# Patient Record
Sex: Female | Born: 1935 | Race: Black or African American | Hispanic: No | Smoking: Former smoker
Health system: Southern US, Community
[De-identification: ages and names within clinical notes are randomized; demographics above are authoritative.]

## PROBLEM LIST (undated history)

## (undated) DIAGNOSIS — K3189 Other diseases of stomach and duodenum: Secondary | ICD-10-CM

## (undated) DIAGNOSIS — M545 Low back pain, unspecified: Secondary | ICD-10-CM

## (undated) DIAGNOSIS — I951 Orthostatic hypotension: Secondary | ICD-10-CM

## (undated) DIAGNOSIS — R0602 Shortness of breath: Secondary | ICD-10-CM

## (undated) DIAGNOSIS — E1165 Type 2 diabetes mellitus with hyperglycemia: Secondary | ICD-10-CM

## (undated) DIAGNOSIS — I209 Angina pectoris, unspecified: Secondary | ICD-10-CM

## (undated) DIAGNOSIS — E860 Dehydration: Secondary | ICD-10-CM

## (undated) DIAGNOSIS — Z9119 Patient's noncompliance with other medical treatment and regimen: Secondary | ICD-10-CM

## (undated) DIAGNOSIS — R51 Headache: Secondary | ICD-10-CM

## (undated) DIAGNOSIS — R112 Nausea with vomiting, unspecified: Secondary | ICD-10-CM

## (undated) DIAGNOSIS — I5032 Chronic diastolic (congestive) heart failure: Secondary | ICD-10-CM

## (undated) DIAGNOSIS — G43909 Migraine, unspecified, not intractable, without status migrainosus: Secondary | ICD-10-CM

## (undated) DIAGNOSIS — Z9289 Personal history of other medical treatment: Secondary | ICD-10-CM

## (undated) DIAGNOSIS — E119 Type 2 diabetes mellitus without complications: Secondary | ICD-10-CM

## (undated) DIAGNOSIS — R296 Repeated falls: Secondary | ICD-10-CM

## (undated) DIAGNOSIS — E118 Type 2 diabetes mellitus with unspecified complications: Secondary | ICD-10-CM

## (undated) DIAGNOSIS — K5900 Constipation, unspecified: Secondary | ICD-10-CM

## (undated) DIAGNOSIS — K219 Gastro-esophageal reflux disease without esophagitis: Secondary | ICD-10-CM

## (undated) DIAGNOSIS — E7800 Pure hypercholesterolemia, unspecified: Secondary | ICD-10-CM

## (undated) DIAGNOSIS — R000 Tachycardia, unspecified: Secondary | ICD-10-CM

## (undated) DIAGNOSIS — F419 Anxiety disorder, unspecified: Secondary | ICD-10-CM

## (undated) DIAGNOSIS — D649 Anemia, unspecified: Secondary | ICD-10-CM

## (undated) DIAGNOSIS — G4733 Obstructive sleep apnea (adult) (pediatric): Secondary | ICD-10-CM

## (undated) DIAGNOSIS — R7989 Other specified abnormal findings of blood chemistry: Secondary | ICD-10-CM

## (undated) DIAGNOSIS — R55 Syncope and collapse: Secondary | ICD-10-CM

## (undated) DIAGNOSIS — R0600 Dyspnea, unspecified: Secondary | ICD-10-CM

## (undated) DIAGNOSIS — R011 Cardiac murmur, unspecified: Secondary | ICD-10-CM

## (undated) DIAGNOSIS — I10 Essential (primary) hypertension: Secondary | ICD-10-CM

## (undated) DIAGNOSIS — E878 Other disorders of electrolyte and fluid balance, not elsewhere classified: Secondary | ICD-10-CM

## (undated) DIAGNOSIS — R739 Hyperglycemia, unspecified: Secondary | ICD-10-CM

## (undated) DIAGNOSIS — IMO0002 Reserved for concepts with insufficient information to code with codable children: Secondary | ICD-10-CM

## (undated) DIAGNOSIS — T7840XA Allergy, unspecified, initial encounter: Secondary | ICD-10-CM

## (undated) DIAGNOSIS — G8929 Other chronic pain: Secondary | ICD-10-CM

## (undated) DIAGNOSIS — J986 Disorders of diaphragm: Secondary | ICD-10-CM

## (undated) DIAGNOSIS — N390 Urinary tract infection, site not specified: Secondary | ICD-10-CM

## (undated) DIAGNOSIS — N179 Acute kidney failure, unspecified: Secondary | ICD-10-CM

## (undated) DIAGNOSIS — B9620 Unspecified Escherichia coli [E. coli] as the cause of diseases classified elsewhere: Secondary | ICD-10-CM

## (undated) DIAGNOSIS — R634 Abnormal weight loss: Secondary | ICD-10-CM

## (undated) DIAGNOSIS — M1990 Unspecified osteoarthritis, unspecified site: Secondary | ICD-10-CM

## (undated) DIAGNOSIS — I499 Cardiac arrhythmia, unspecified: Secondary | ICD-10-CM

## (undated) DIAGNOSIS — I5031 Acute diastolic (congestive) heart failure: Secondary | ICD-10-CM

## (undated) DIAGNOSIS — A419 Sepsis, unspecified organism: Secondary | ICD-10-CM

## (undated) DIAGNOSIS — J42 Unspecified chronic bronchitis: Secondary | ICD-10-CM

## (undated) DIAGNOSIS — J9601 Acute respiratory failure with hypoxia: Secondary | ICD-10-CM

## (undated) DIAGNOSIS — J449 Chronic obstructive pulmonary disease, unspecified: Secondary | ICD-10-CM

## (undated) DIAGNOSIS — R079 Chest pain, unspecified: Secondary | ICD-10-CM

## (undated) HISTORY — DX: Chest pain, unspecified: R07.9

## (undated) HISTORY — DX: Other specified abnormal findings of blood chemistry: R79.89

## (undated) HISTORY — DX: Disorders of diaphragm: J98.6

## (undated) HISTORY — DX: Other disorders of electrolyte and fluid balance, not elsewhere classified: E87.8

## (undated) HISTORY — DX: Acute respiratory failure with hypoxia: J96.01

## (undated) HISTORY — DX: Reserved for concepts with insufficient information to code with codable children: IMO0002

## (undated) HISTORY — DX: Dehydration: E86.0

## (undated) HISTORY — DX: Type 2 diabetes mellitus without complications: E11.9

## (undated) HISTORY — PX: TONSILLECTOMY: SUR1361

## (undated) HISTORY — DX: Acute diastolic (congestive) heart failure: I50.31

## (undated) HISTORY — DX: Patient's noncompliance with other medical treatment and regimen: Z91.19

## (undated) HISTORY — DX: Chronic obstructive pulmonary disease, unspecified: J44.9

## (undated) HISTORY — DX: Sepsis, unspecified organism: A41.9

## (undated) HISTORY — DX: Syncope and collapse: R55

## (undated) HISTORY — PX: MULTIPLE TOOTH EXTRACTIONS: SHX2053

## (undated) HISTORY — DX: Chronic diastolic (congestive) heart failure: I50.32

## (undated) HISTORY — DX: Type 2 diabetes mellitus with hyperglycemia: E11.65

## (undated) HISTORY — DX: Hyperglycemia, unspecified: R73.9

## (undated) HISTORY — PX: FRACTURE SURGERY: SHX138

## (undated) HISTORY — DX: Unspecified Escherichia coli (E. coli) as the cause of diseases classified elsewhere: B96.20

## (undated) HISTORY — DX: Abnormal weight loss: R63.4

## (undated) HISTORY — DX: Urinary tract infection, site not specified: N39.0

## (undated) HISTORY — DX: Acute kidney failure, unspecified: N17.9

## (undated) HISTORY — DX: Type 2 diabetes mellitus with unspecified complications: E11.8

## (undated) HISTORY — DX: Nausea with vomiting, unspecified: R11.2

## (undated) HISTORY — PX: LAPAROSCOPIC CHOLECYSTECTOMY: SUR755

## (undated) HISTORY — PX: BACK SURGERY: SHX140

## (undated) HISTORY — DX: Other diseases of stomach and duodenum: K31.89

## (undated) HISTORY — DX: Allergy, unspecified, initial encounter: T78.40XA

## (undated) HISTORY — PX: ORIF SHOULDER FRACTURE: SHX5035

## (undated) HISTORY — DX: Repeated falls: R29.6

## (undated) HISTORY — DX: Tachycardia, unspecified: R00.0

## (undated) HISTORY — DX: Orthostatic hypotension: I95.1

## (undated) HISTORY — DX: Essential (primary) hypertension: I10

## (undated) HISTORY — DX: Anxiety disorder, unspecified: F41.9

## (undated) HISTORY — DX: Constipation, unspecified: K59.00

## (undated) HISTORY — DX: Dyspnea, unspecified: R06.00

---

## 1968-07-29 HISTORY — PX: ECTOPIC PREGNANCY SURGERY: SHX613

## 1968-07-29 HISTORY — PX: APPENDECTOMY: SHX54

## 1977-07-29 HISTORY — PX: TOTAL ABDOMINAL HYSTERECTOMY: SHX209

## 1998-01-22 ENCOUNTER — Emergency Department (HOSPITAL_COMMUNITY): Admission: EM | Admit: 1998-01-22 | Discharge: 1998-01-22 | Payer: Self-pay | Admitting: Emergency Medicine

## 1998-04-06 ENCOUNTER — Emergency Department (HOSPITAL_COMMUNITY): Admission: EM | Admit: 1998-04-06 | Discharge: 1998-04-06 | Payer: Self-pay | Admitting: Emergency Medicine

## 1998-08-12 ENCOUNTER — Encounter: Payer: Self-pay | Admitting: Emergency Medicine

## 1998-08-12 ENCOUNTER — Emergency Department (HOSPITAL_COMMUNITY): Admission: EM | Admit: 1998-08-12 | Discharge: 1998-08-12 | Payer: Self-pay | Admitting: Emergency Medicine

## 1999-11-18 ENCOUNTER — Emergency Department (HOSPITAL_COMMUNITY): Admission: EM | Admit: 1999-11-18 | Discharge: 1999-11-18 | Payer: Self-pay | Admitting: Emergency Medicine

## 1999-11-18 ENCOUNTER — Encounter: Payer: Self-pay | Admitting: Emergency Medicine

## 2000-07-21 ENCOUNTER — Emergency Department (HOSPITAL_COMMUNITY): Admission: EM | Admit: 2000-07-21 | Discharge: 2000-07-21 | Payer: Self-pay | Admitting: Emergency Medicine

## 2000-07-28 ENCOUNTER — Emergency Department (HOSPITAL_COMMUNITY): Admission: EM | Admit: 2000-07-28 | Discharge: 2000-07-28 | Payer: Self-pay | Admitting: Internal Medicine

## 2001-03-27 ENCOUNTER — Emergency Department (HOSPITAL_COMMUNITY): Admission: EM | Admit: 2001-03-27 | Discharge: 2001-03-27 | Payer: Self-pay | Admitting: Emergency Medicine

## 2001-03-27 ENCOUNTER — Encounter: Payer: Self-pay | Admitting: Emergency Medicine

## 2001-03-30 ENCOUNTER — Emergency Department (HOSPITAL_COMMUNITY): Admission: EM | Admit: 2001-03-30 | Discharge: 2001-03-30 | Payer: Self-pay | Admitting: Emergency Medicine

## 2001-03-30 ENCOUNTER — Encounter: Payer: Self-pay | Admitting: *Deleted

## 2001-03-30 ENCOUNTER — Encounter: Payer: Self-pay | Admitting: Emergency Medicine

## 2001-03-31 ENCOUNTER — Encounter: Payer: Self-pay | Admitting: *Deleted

## 2001-03-31 ENCOUNTER — Ambulatory Visit (HOSPITAL_COMMUNITY): Admission: RE | Admit: 2001-03-31 | Discharge: 2001-03-31 | Payer: Self-pay | Admitting: *Deleted

## 2001-04-13 ENCOUNTER — Encounter: Payer: Self-pay | Admitting: Orthopedic Surgery

## 2001-04-13 ENCOUNTER — Encounter: Admission: RE | Admit: 2001-04-13 | Discharge: 2001-04-13 | Payer: Self-pay | Admitting: Orthopedic Surgery

## 2001-05-08 ENCOUNTER — Encounter: Payer: Self-pay | Admitting: Orthopedic Surgery

## 2001-05-08 ENCOUNTER — Encounter: Admission: RE | Admit: 2001-05-08 | Discharge: 2001-05-08 | Payer: Self-pay | Admitting: Orthopedic Surgery

## 2001-05-22 ENCOUNTER — Encounter: Admission: RE | Admit: 2001-05-22 | Discharge: 2001-05-22 | Payer: Self-pay | Admitting: Orthopedic Surgery

## 2001-05-22 ENCOUNTER — Encounter: Payer: Self-pay | Admitting: Orthopedic Surgery

## 2003-07-05 ENCOUNTER — Inpatient Hospital Stay (HOSPITAL_COMMUNITY): Admission: EM | Admit: 2003-07-05 | Discharge: 2003-07-08 | Payer: Self-pay | Admitting: Emergency Medicine

## 2003-07-06 ENCOUNTER — Encounter: Payer: Self-pay | Admitting: Cardiology

## 2003-07-11 ENCOUNTER — Ambulatory Visit (HOSPITAL_COMMUNITY): Admission: RE | Admit: 2003-07-11 | Discharge: 2003-07-11 | Payer: Self-pay | Admitting: *Deleted

## 2003-09-06 ENCOUNTER — Inpatient Hospital Stay (HOSPITAL_COMMUNITY): Admission: EM | Admit: 2003-09-06 | Discharge: 2003-09-09 | Payer: Self-pay | Admitting: Emergency Medicine

## 2003-09-07 ENCOUNTER — Encounter (INDEPENDENT_AMBULATORY_CARE_PROVIDER_SITE_OTHER): Payer: Self-pay | Admitting: Specialist

## 2003-10-23 ENCOUNTER — Emergency Department (HOSPITAL_COMMUNITY): Admission: EM | Admit: 2003-10-23 | Discharge: 2003-10-23 | Payer: Self-pay | Admitting: Emergency Medicine

## 2004-02-29 ENCOUNTER — Ambulatory Visit (HOSPITAL_COMMUNITY): Admission: RE | Admit: 2004-02-29 | Discharge: 2004-02-29 | Payer: Self-pay | Admitting: Family Medicine

## 2004-05-18 ENCOUNTER — Emergency Department (HOSPITAL_COMMUNITY): Admission: EM | Admit: 2004-05-18 | Discharge: 2004-05-18 | Payer: Self-pay | Admitting: Emergency Medicine

## 2004-06-07 ENCOUNTER — Ambulatory Visit: Payer: Self-pay | Admitting: Family Medicine

## 2004-06-14 ENCOUNTER — Ambulatory Visit (HOSPITAL_COMMUNITY): Admission: RE | Admit: 2004-06-14 | Discharge: 2004-06-14 | Payer: Self-pay | Admitting: Internal Medicine

## 2004-08-16 ENCOUNTER — Ambulatory Visit (HOSPITAL_COMMUNITY): Admission: RE | Admit: 2004-08-16 | Discharge: 2004-08-16 | Payer: Self-pay | Admitting: *Deleted

## 2004-08-16 ENCOUNTER — Encounter (INDEPENDENT_AMBULATORY_CARE_PROVIDER_SITE_OTHER): Payer: Self-pay | Admitting: *Deleted

## 2004-09-06 ENCOUNTER — Ambulatory Visit: Payer: Self-pay | Admitting: Family Medicine

## 2004-10-02 ENCOUNTER — Ambulatory Visit: Payer: Self-pay | Admitting: Family Medicine

## 2004-10-03 ENCOUNTER — Ambulatory Visit: Payer: Self-pay | Admitting: Family Medicine

## 2004-12-27 ENCOUNTER — Ambulatory Visit: Payer: Self-pay | Admitting: Family Medicine

## 2005-01-03 ENCOUNTER — Ambulatory Visit (HOSPITAL_COMMUNITY): Admission: RE | Admit: 2005-01-03 | Discharge: 2005-01-03 | Payer: Self-pay | Admitting: Family Medicine

## 2005-01-11 ENCOUNTER — Encounter: Admission: RE | Admit: 2005-01-11 | Discharge: 2005-01-11 | Payer: Self-pay | Admitting: Internal Medicine

## 2005-03-12 ENCOUNTER — Ambulatory Visit: Payer: Self-pay | Admitting: Family Medicine

## 2005-04-16 ENCOUNTER — Ambulatory Visit: Payer: Self-pay | Admitting: Family Medicine

## 2005-05-21 ENCOUNTER — Ambulatory Visit: Payer: Self-pay | Admitting: Family Medicine

## 2005-07-02 ENCOUNTER — Ambulatory Visit: Payer: Self-pay | Admitting: Family Medicine

## 2005-09-12 ENCOUNTER — Ambulatory Visit: Payer: Self-pay | Admitting: Family Medicine

## 2005-12-05 ENCOUNTER — Ambulatory Visit: Payer: Self-pay | Admitting: Family Medicine

## 2006-01-02 ENCOUNTER — Ambulatory Visit: Payer: Self-pay | Admitting: Family Medicine

## 2006-01-31 ENCOUNTER — Emergency Department (HOSPITAL_COMMUNITY): Admission: EM | Admit: 2006-01-31 | Discharge: 2006-01-31 | Payer: Self-pay | Admitting: *Deleted

## 2006-05-22 ENCOUNTER — Ambulatory Visit: Payer: Self-pay | Admitting: Family Medicine

## 2006-05-24 ENCOUNTER — Emergency Department (HOSPITAL_COMMUNITY): Admission: EM | Admit: 2006-05-24 | Discharge: 2006-05-24 | Payer: Self-pay | Admitting: Emergency Medicine

## 2006-06-10 ENCOUNTER — Ambulatory Visit: Payer: Self-pay | Admitting: Family Medicine

## 2006-06-23 ENCOUNTER — Encounter: Admission: RE | Admit: 2006-06-23 | Discharge: 2006-06-23 | Payer: Self-pay | Admitting: Orthopaedic Surgery

## 2006-06-26 ENCOUNTER — Ambulatory Visit: Payer: Self-pay | Admitting: Family Medicine

## 2006-07-09 ENCOUNTER — Encounter: Admission: RE | Admit: 2006-07-09 | Discharge: 2006-07-09 | Payer: Self-pay | Admitting: Orthopaedic Surgery

## 2006-07-25 ENCOUNTER — Encounter: Admission: RE | Admit: 2006-07-25 | Discharge: 2006-07-25 | Payer: Self-pay | Admitting: *Deleted

## 2006-07-30 ENCOUNTER — Encounter: Admission: RE | Admit: 2006-07-30 | Discharge: 2006-07-30 | Payer: Self-pay | Admitting: Orthopaedic Surgery

## 2006-08-21 ENCOUNTER — Encounter: Admission: RE | Admit: 2006-08-21 | Discharge: 2006-08-21 | Payer: Self-pay | Admitting: Orthopaedic Surgery

## 2006-09-11 ENCOUNTER — Ambulatory Visit: Payer: Self-pay | Admitting: Family Medicine

## 2006-09-23 ENCOUNTER — Emergency Department (HOSPITAL_COMMUNITY): Admission: EM | Admit: 2006-09-23 | Discharge: 2006-09-24 | Payer: Self-pay | Admitting: Emergency Medicine

## 2006-09-25 ENCOUNTER — Inpatient Hospital Stay (HOSPITAL_COMMUNITY): Admission: EM | Admit: 2006-09-25 | Discharge: 2006-09-29 | Payer: Self-pay | Admitting: Emergency Medicine

## 2006-09-27 HISTORY — PX: LUMBAR MICRODISCECTOMY: SHX99

## 2006-11-20 ENCOUNTER — Ambulatory Visit: Payer: Self-pay | Admitting: Family Medicine

## 2006-12-22 ENCOUNTER — Emergency Department (HOSPITAL_COMMUNITY): Admission: EM | Admit: 2006-12-22 | Discharge: 2006-12-22 | Payer: Self-pay | Admitting: *Deleted

## 2006-12-25 ENCOUNTER — Ambulatory Visit: Payer: Self-pay | Admitting: Family Medicine

## 2007-04-17 ENCOUNTER — Ambulatory Visit: Payer: Self-pay | Admitting: Internal Medicine

## 2007-05-03 ENCOUNTER — Emergency Department (HOSPITAL_COMMUNITY): Admission: EM | Admit: 2007-05-03 | Discharge: 2007-05-03 | Payer: Self-pay | Admitting: Emergency Medicine

## 2007-07-07 ENCOUNTER — Ambulatory Visit: Payer: Self-pay | Admitting: Internal Medicine

## 2007-07-07 ENCOUNTER — Encounter (INDEPENDENT_AMBULATORY_CARE_PROVIDER_SITE_OTHER): Payer: Self-pay | Admitting: Family Medicine

## 2007-07-07 LAB — CONVERTED CEMR LAB
Albumin: 4 g/dL (ref 3.5–5.2)
CO2: 23 meq/L (ref 19–32)
Cholesterol: 236 mg/dL — ABNORMAL HIGH (ref 0–200)
Eosinophils Relative: 0 % (ref 0–5)
Glucose, Bld: 84 mg/dL (ref 70–99)
HCT: 40.5 % (ref 36.0–46.0)
Hemoglobin: 13.4 g/dL (ref 12.0–15.0)
Lymphocytes Relative: 30 % (ref 12–46)
Lymphs Abs: 1.8 10*3/uL (ref 0.7–4.0)
Platelets: 311 10*3/uL (ref 150–400)
Sodium: 142 meq/L (ref 135–145)
Total Bilirubin: 0.3 mg/dL (ref 0.3–1.2)
Total Protein: 7.3 g/dL (ref 6.0–8.3)
Triglycerides: 209 mg/dL — ABNORMAL HIGH (ref <150)
VLDL: 42 mg/dL — ABNORMAL HIGH (ref 0–40)
WBC: 6 10*3/uL (ref 4.0–10.5)

## 2007-07-11 ENCOUNTER — Emergency Department (HOSPITAL_COMMUNITY): Admission: EM | Admit: 2007-07-11 | Discharge: 2007-07-11 | Payer: Self-pay | Admitting: *Deleted

## 2007-08-04 ENCOUNTER — Ambulatory Visit: Payer: Self-pay | Admitting: Internal Medicine

## 2007-09-09 ENCOUNTER — Ambulatory Visit: Payer: Self-pay | Admitting: Internal Medicine

## 2007-09-21 ENCOUNTER — Inpatient Hospital Stay (HOSPITAL_COMMUNITY): Admission: EM | Admit: 2007-09-21 | Discharge: 2007-09-23 | Payer: Self-pay | Admitting: Emergency Medicine

## 2007-09-21 ENCOUNTER — Ambulatory Visit: Payer: Self-pay | Admitting: Cardiology

## 2007-11-04 ENCOUNTER — Inpatient Hospital Stay (HOSPITAL_COMMUNITY): Admission: EM | Admit: 2007-11-04 | Discharge: 2007-11-15 | Payer: Self-pay | Admitting: Emergency Medicine

## 2007-11-30 ENCOUNTER — Ambulatory Visit: Payer: Self-pay | Admitting: Internal Medicine

## 2007-12-06 ENCOUNTER — Emergency Department (HOSPITAL_COMMUNITY): Admission: EM | Admit: 2007-12-06 | Discharge: 2007-12-06 | Payer: Self-pay | Admitting: Emergency Medicine

## 2007-12-08 ENCOUNTER — Ambulatory Visit: Payer: Self-pay | Admitting: Internal Medicine

## 2008-01-01 ENCOUNTER — Ambulatory Visit: Payer: Self-pay | Admitting: Internal Medicine

## 2008-01-05 ENCOUNTER — Ambulatory Visit: Payer: Self-pay | Admitting: Internal Medicine

## 2008-05-11 ENCOUNTER — Ambulatory Visit: Payer: Self-pay | Admitting: Internal Medicine

## 2008-05-11 LAB — CONVERTED CEMR LAB
BUN: 12 mg/dL (ref 6–23)
CO2: 23 meq/L (ref 19–32)
Chloride: 105 meq/L (ref 96–112)
Glucose, Bld: 86 mg/dL (ref 70–99)
Potassium: 4.2 meq/L (ref 3.5–5.3)

## 2008-05-23 ENCOUNTER — Ambulatory Visit: Payer: Self-pay | Admitting: Internal Medicine

## 2008-05-23 ENCOUNTER — Encounter (INDEPENDENT_AMBULATORY_CARE_PROVIDER_SITE_OTHER): Payer: Self-pay | Admitting: Adult Health

## 2008-05-23 LAB — CONVERTED CEMR LAB
AST: 15 units/L (ref 0–37)
Albumin: 4 g/dL (ref 3.5–5.2)
Alkaline Phosphatase: 106 units/L (ref 39–117)
BUN: 11 mg/dL (ref 6–23)
Basophils Relative: 1 % (ref 0–1)
Eosinophils Absolute: 0.1 10*3/uL (ref 0.0–0.7)
HDL: 52 mg/dL (ref >39)
LDL Cholesterol: 54 mg/dL (ref 0–99)
MCHC: 32.2 g/dL (ref 30.0–36.0)
MCV: 90.8 fL (ref 78.0–100.0)
Neutrophils Relative %: 56 % (ref 43–77)
Platelets: 299 10*3/uL (ref 150–400)
Potassium: 4.1 meq/L (ref 3.5–5.3)
Sodium: 144 meq/L (ref 135–145)
Total Bilirubin: 0.4 mg/dL (ref 0.3–1.2)
Total Protein: 7.5 g/dL (ref 6.0–8.3)
VLDL: 24 mg/dL (ref 0–40)

## 2008-09-26 ENCOUNTER — Ambulatory Visit: Payer: Self-pay | Admitting: Internal Medicine

## 2008-11-07 ENCOUNTER — Encounter: Payer: Self-pay | Admitting: Family Medicine

## 2008-11-07 ENCOUNTER — Ambulatory Visit: Payer: Self-pay | Admitting: Internal Medicine

## 2008-11-07 LAB — CONVERTED CEMR LAB
ALT: <8 units/L (ref 0–35)
AST: 19 units/L (ref 0–37)
Alkaline Phosphatase: 130 units/L — ABNORMAL HIGH (ref 39–117)
Basophils Absolute: 0 10*3/uL (ref 0.0–0.1)
Basophils Relative: 1 % (ref 0–1)
CO2: 21 meq/L (ref 19–32)
Creatinine, Ser: 0.56 mg/dL (ref 0.40–1.20)
Hemoglobin: 12.6 g/dL (ref 12.0–15.0)
LDL Cholesterol: 87 mg/dL (ref 0–99)
Lymphocytes Relative: 31 % (ref 12–46)
Microalb, Ur: 4.02 mg/dL — ABNORMAL HIGH (ref 0.00–1.89)
Monocytes Absolute: 0.3 10*3/uL (ref 0.1–1.0)
Neutro Abs: 4 10*3/uL (ref 1.7–7.7)
Neutrophils Relative %: 61 % (ref 43–77)
RDW: 14.3 % (ref 11.5–15.5)
Sed Rate: 30 mm/hr — ABNORMAL HIGH (ref 0–22)
Sodium: 140 meq/L (ref 135–145)
Total Bilirubin: 0.2 mg/dL — ABNORMAL LOW (ref 0.3–1.2)
Total CHOL/HDL Ratio: 3.3
Total Protein: 7.5 g/dL (ref 6.0–8.3)
VLDL: 25 mg/dL (ref 0–40)
Vit D, 25-Hydroxy: 24 ng/mL — ABNORMAL LOW (ref 30–89)

## 2008-11-18 ENCOUNTER — Ambulatory Visit: Payer: Self-pay | Admitting: Internal Medicine

## 2009-01-24 ENCOUNTER — Emergency Department (HOSPITAL_COMMUNITY): Admission: EM | Admit: 2009-01-24 | Discharge: 2009-01-24 | Payer: Self-pay | Admitting: Emergency Medicine

## 2009-02-02 ENCOUNTER — Emergency Department (HOSPITAL_COMMUNITY): Admission: EM | Admit: 2009-02-02 | Discharge: 2009-02-02 | Payer: Self-pay | Admitting: Emergency Medicine

## 2009-02-09 ENCOUNTER — Emergency Department (HOSPITAL_COMMUNITY): Admission: EM | Admit: 2009-02-09 | Discharge: 2009-02-09 | Payer: Self-pay | Admitting: Emergency Medicine

## 2009-02-17 ENCOUNTER — Ambulatory Visit: Payer: Self-pay | Admitting: Internal Medicine

## 2009-05-30 ENCOUNTER — Ambulatory Visit: Payer: Self-pay | Admitting: Internal Medicine

## 2009-06-12 ENCOUNTER — Ambulatory Visit: Payer: Self-pay | Admitting: Internal Medicine

## 2009-06-12 ENCOUNTER — Encounter: Payer: Self-pay | Admitting: Family Medicine

## 2009-06-12 LAB — CONVERTED CEMR LAB
ALT: 10 units/L (ref 0–35)
Alkaline Phosphatase: 89 units/L (ref 39–117)
CO2: 26 meq/L (ref 19–32)
Cholesterol: 213 mg/dL — ABNORMAL HIGH (ref 0–200)
Creatinine, Ser: 0.59 mg/dL (ref 0.40–1.20)
LDL Cholesterol: 115 mg/dL — ABNORMAL HIGH (ref 0–99)
Sodium: 139 meq/L (ref 135–145)
Total Bilirubin: 0.3 mg/dL (ref 0.3–1.2)
Total CHOL/HDL Ratio: 4.3
Total Protein: 7.2 g/dL (ref 6.0–8.3)
Triglycerides: 247 mg/dL — ABNORMAL HIGH (ref <150)
VLDL: 49 mg/dL — ABNORMAL HIGH (ref 0–40)

## 2009-06-19 ENCOUNTER — Ambulatory Visit: Payer: Self-pay | Admitting: Internal Medicine

## 2009-10-20 ENCOUNTER — Ambulatory Visit: Payer: Self-pay | Admitting: Family Medicine

## 2009-11-28 ENCOUNTER — Emergency Department (HOSPITAL_COMMUNITY): Admission: EM | Admit: 2009-11-28 | Discharge: 2009-11-28 | Payer: Self-pay | Admitting: Emergency Medicine

## 2009-12-01 ENCOUNTER — Ambulatory Visit: Payer: Self-pay | Admitting: Internal Medicine

## 2010-02-12 ENCOUNTER — Ambulatory Visit: Payer: Self-pay | Admitting: Internal Medicine

## 2010-04-10 ENCOUNTER — Ambulatory Visit: Payer: Self-pay | Admitting: Internal Medicine

## 2010-04-10 LAB — CONVERTED CEMR LAB
CO2: 24 meq/L (ref 19–32)
CRP: 0.5 mg/dL (ref <0.6)
Calcium: 9.1 mg/dL (ref 8.4–10.5)
Glucose, Bld: 163 mg/dL — ABNORMAL HIGH (ref 70–99)
Hgb A1c MFr Bld: 6.2 % — ABNORMAL HIGH (ref <5.7)
LDL Cholesterol: 108 mg/dL — ABNORMAL HIGH (ref 0–99)
Total CHOL/HDL Ratio: 3.3
VLDL: 27 mg/dL (ref 0–40)

## 2010-06-14 ENCOUNTER — Encounter (INDEPENDENT_AMBULATORY_CARE_PROVIDER_SITE_OTHER): Payer: Self-pay | Admitting: Family Medicine

## 2010-06-14 ENCOUNTER — Ambulatory Visit: Payer: Self-pay | Admitting: Vascular Surgery

## 2010-06-14 ENCOUNTER — Ambulatory Visit (HOSPITAL_COMMUNITY): Admission: RE | Admit: 2010-06-14 | Discharge: 2010-06-14 | Payer: Self-pay | Admitting: Family Medicine

## 2010-06-16 ENCOUNTER — Emergency Department (HOSPITAL_COMMUNITY): Admission: EM | Admit: 2010-06-16 | Discharge: 2010-06-16 | Payer: Self-pay | Admitting: Emergency Medicine

## 2010-07-02 ENCOUNTER — Encounter (INDEPENDENT_AMBULATORY_CARE_PROVIDER_SITE_OTHER): Payer: Self-pay | Admitting: Internal Medicine

## 2010-07-02 LAB — CONVERTED CEMR LAB
ALT: 11 units/L (ref 0–35)
AST: 20 units/L (ref 0–37)
Basophils Absolute: 0.1 10*3/uL (ref 0.0–0.1)
CO2: 28 meq/L (ref 19–32)
Calcium: 10 mg/dL (ref 8.4–10.5)
Chloride: 101 meq/L (ref 96–112)
Cholesterol: 172 mg/dL (ref 0–200)
Hemoglobin: 13.6 g/dL (ref 12.0–15.0)
Lymphocytes Relative: 42 % (ref 12–46)
Neutro Abs: 3.8 10*3/uL (ref 1.7–7.7)
Neutrophils Relative %: 50 % (ref 43–77)
Platelets: 348 10*3/uL (ref 150–400)
Potassium: 4.9 meq/L (ref 3.5–5.3)
RDW: 13.4 % (ref 11.5–15.5)
Sodium: 138 meq/L (ref 135–145)
Total Protein: 7.5 g/dL (ref 6.0–8.3)

## 2010-08-19 ENCOUNTER — Encounter: Payer: Self-pay | Admitting: Family Medicine

## 2010-10-16 LAB — URINALYSIS, ROUTINE W REFLEX MICROSCOPIC
Glucose, UA: NEGATIVE mg/dL
Hgb urine dipstick: NEGATIVE
Leukocytes, UA: NEGATIVE
Specific Gravity, Urine: 1.018 (ref 1.005–1.030)
Urobilinogen, UA: 1 mg/dL (ref 0.0–1.0)

## 2010-10-16 LAB — URINE MICROSCOPIC-ADD ON

## 2010-10-16 LAB — URINE CULTURE

## 2010-11-04 LAB — DIFFERENTIAL
Basophils Relative: 0 % (ref 0–1)
Lymphocytes Relative: 20 % (ref 12–46)
Lymphs Abs: 1.9 10*3/uL (ref 0.7–4.0)
Monocytes Absolute: 0.2 10*3/uL (ref 0.1–1.0)
Monocytes Relative: 2 % — ABNORMAL LOW (ref 3–12)
Neutro Abs: 7.1 10*3/uL (ref 1.7–7.7)
Neutrophils Relative %: 76 % (ref 43–77)

## 2010-11-04 LAB — CBC
Hemoglobin: 13.9 g/dL (ref 12.0–15.0)
RBC: 4.64 MIL/uL (ref 3.87–5.11)
WBC: 9.4 10*3/uL (ref 4.0–10.5)

## 2010-11-04 LAB — TROPONIN I: Troponin I: 0.01 ng/mL (ref 0.00–0.06)

## 2010-11-04 LAB — CK TOTAL AND CKMB (NOT AT ARMC)
CK, MB: 0.7 ng/mL (ref 0.3–4.0)
Total CK: 59 U/L (ref 7–177)

## 2010-11-04 LAB — GLUCOSE, CAPILLARY: Glucose-Capillary: 134 mg/dL — ABNORMAL HIGH (ref 70–99)

## 2010-11-04 LAB — COMPREHENSIVE METABOLIC PANEL
AST: 20 U/L (ref 0–37)
Albumin: 3.7 g/dL (ref 3.5–5.2)
Alkaline Phosphatase: 108 U/L (ref 39–117)
BUN: 12 mg/dL (ref 6–23)
GFR calc Af Amer: >60 mL/min (ref >60)
Potassium: 4 mEq/L (ref 3.5–5.1)
Total Protein: 7.6 g/dL (ref 6.0–8.3)

## 2010-11-04 LAB — POCT CARDIAC MARKERS
CKMB, poc: <1 ng/mL — ABNORMAL LOW (ref 1.0–8.0)
Troponin i, poc: <0.05 ng/mL (ref 0.00–0.09)

## 2010-12-11 NOTE — Discharge Summary (Signed)
[name], [name]              ACCOUNT NO.:  [id number]   MEDICAL RECORD NO.:  [id number]          PATIENT TYPE:  INP   LOCATION:  1435                         FACILITY:  [facility]   PHYSICIAN:  Michaelyn Barter, M.D. DATE OF BIRTH:  03-Jul-1936   DATE OF ADMISSION:  09/21/2007  DATE OF DISCHARGE:  09/23/2007                               DISCHARGE SUMMARY   PRIMARY CARE DOCTOR:  [facility].   FINAL DIAGNOSIS:  1. Atypical chest pain.  2. Cough.   PROCEDURES:  1. CT scan of the chest with angiographic contrast, completed September 21, 2007.  2. MRI of the lumbar and cervical spine, completed September 21, 2007.  3. Upper GI swallow study, completed September 22, 2007.  4. Two-view chest x-ray, completed September 21, 2007.  5. Portable chest x-ray, completed September 21, 2007.   HISTORY OF PRESENT ILLNESS:  [name] is a 75 year old female.  She  complained of centrally located chest discomfort that occurred off and  on for approximately 5 days leading up to this admission.  She indicated  that her symptoms typically occurred when she was lying in bed at night  and was described as a pressure type sensation associated with  smothering sensation during which time the patient indicated that it was  difficult for her to breathe.  She indicated that she would typically  get up and ambulate for awhile and her symptoms would resolve.  In  addition, she complained of some posterior neck pain for several months,  as well as low back pain that radiated around to the bilateral lower  abdominal quadrants.   PAST MEDICAL HISTORY:  For past medical history, please see that  dictated by Dr. Isidor Holts.   HOSPITAL COURSE:  1. Atypical chest pain.  The patient was admitted to a telemetry      floor.  An EKG completed on February 23 revealed normal sinus      rhythm, some questionable findings within leads III and V1, the      acuity of which was questionable.  The patient's  troponin Is were      found to be 0.04 and 0.05.  Her CK MBs were found to be 0.5, 0.5      and 0.6.  The patient's cardiac enzymes were negative.  She had a      chest x-ray completed on February 23, which revealed questionable      airspace disease in the right medial lung base.  PA and lateral was      recommended.  The PA and lateral x-ray revealed no evidence for      right lower lobe airspace disease.  No active disease was seen at      all.  The patient had a D-dimer completed.  It was found to be      elevated at 3.58.  A CT scan of the patient's chest with angio      contrast was completed, which revealed no pulmonary embolus.  A      mass-like density in the left lateral gastrohepatic ligament was  noted.  Because of this finding, an upper GI examination was      recommended.  The patient had an upper GI series completed on      February 24.  It revealed a small sliding hiatal hernia without      reflux, stricture or esophagitis.  Several small diverticula of the      second and third portions of the duodenum.  No acute or significant      findings were seen..  By the date of discharge, the patient's chest      pain had resolved.  She had no additional complaints.  She did      complain of a cough, however.  2. Neck and back pain.  An MRI of the patient's cervical spine was      done on February 23.  It revealed multilevel advanced degenerative      changes with mild mass effect on the spinal cord at C5-6 and mild      overall spinal stenosis C6-7.  No abnormal spinal cord signal or      enhancement was seen.  Multilevel moderate and severe neural      foraminal stenosis was most  pronounced at C4-5 on the right, C5-6      on the right and C6-7 bilaterally.  MRI of the patient's lumbar      spine revealed acute or subacute T12 anterior wedge compression      fracture with mild retropulsion of disk and superior endplate      without spinal cord compression.  Chronic L4  compression fracture      was seen.  Stable severe left neural foraminal stenosis was also      noted.  Increased left lateral recess stenosis may reflect post      operative granulation tissue.  By the date of discharge, the      patient had no additional complaints.  The decision was made to      discharge the patient from the hospital.   DISCHARGE MEDICATIONS:  The patient was discharged on:  1. Mucinex 600 mg q.12 h.  2. Darvocet 1-2 tablets q.6 h p.r.n. for pain.  3. Metoprolol 50 mg p.o. b.i.d.  4. Lipitor 20 mg daily.  5. Hydroxyzine 50 mg p.o. b.i.d.  She was instructed to continue all her other medications.   [facility] was consulted during the course of this  hospitalization.  Dr. Olga Millers saw the patient on February 23.  He  indicated that the patient's chest pain was not consistent with cardiac  etiology.  He stated that her EKG showed no ST changes, although there  was question of a prior infarct inferiorly.  He also went on to state  that the patient's pain had been almost continuous for the past 3 months  and increased with palpation and certain movements.  He indicated that  he felt that this was possibly musculoskeletal related pain.  Because  the patient did have a questionable inferior myocardial infarction on  her EKG, an echo was ordered.  The final decision was made to perform  the echo as an outpatient.  Therefore, the patient was given an  appointment for March 11 to follow-up with Dr. Jens Som.      Michaelyn Barter, M.D.  Electronically Signed     OR/MEDQ  D:  10/29/2007  T:  10/29/2007  Job:  [id number]

## 2010-12-11 NOTE — Discharge Summary (Signed)
[name], [name]              ACCOUNT NO.:  [id number]   MEDICAL RECORD NO.:  [id number]          PATIENT TYPE:  INP   LOCATION:  1424                         FACILITY:  [facility]   PHYSICIAN:  Lonia Blood, M.D.DATE OF BIRTH:  Jun 05, 1936   DATE OF ADMISSION:  11/04/2007  DATE OF DISCHARGE:  11/14/2007                               DISCHARGE SUMMARY   PRIMARY CARE PHYSICIAN:  Dineen Kid. Reche Dixon, M.D., [facility].   DISCHARGE DIAGNOSES:  1. T12 compression fracture with intractable back pain.      a.     Status post kyphoplasty.      b.     Pain much improved.  2. Rheumatoid arthritis.  3. Osteoarthritis.  4. Gastroparesis secondary to narcotics - resolved.  5. Right lower lobe pneumonia - completed course of antibiotics.  6. Urinary tract infection - completed course of antibiotics.  7. Diabetes mellitus type 2.  8. Peripheral neuropathy.  9. Hypertension.   DISCHARGE MEDICATIONS:  1. Lisinopril 10 mg p.o. daily.  2. Elavil 25 mg p.o. q.h.s.  3. Methotrexate - not currently using.  4. Metoprolol 50 mg b.i.d.  5. Lyrica 75 mg p.o. daily.  6. Amaryl 4 mg p.o. b.i.d.  7. Pepcid over-the-counter p.r.n.  8. Lipitor 20 mg p.o. daily.  9. Miacalcin nasal spray 1 puff in alternating nostrils daily.  10.Calcium plus D 500 mg 3 times a day with food.  11.Xanax 0.5 mg t.i.d. p.r.n. with #40 supplied and no refills.  12.Percocet 5/325 1-2 q.4h. p.r.n. severe pain, #30 with no refills.   FOLLOWUP:  The patient is advised to follow up with her primary care  physician, Dr. Reche Dixon, at [facility] within 7-10 days.  At that time,  the patient should be monitored to assure that her pain is well-  controlled.  Routine care for diabetes and hypertension should be  carried out at that time as well.   CONSULTATIONS:  Interventional radiology.   PROCEDURE:  Kyphoplasty on November 09, 2007 at the T12 level.   HOSPITAL COURSE:  Please see previously dictated discharge summary by  Dr.  Hillery Aldo from November 08, 2007.   After the patient's kyphoplasty, her hospital course proceeded quite  nicely.  A full course of antibiotics was continued for fever which was  felt to be due to right upper lobe pneumonia.  Urinalysis was obtained  and also revealed a urinary tract infection.  With a 7-day antibiotic  course, however, the patient's fever resolved and symptoms resolved  completely.  Chest x-ray actually revealed resolution of her pulmonary  infiltrate.  There were no urinary symptoms.   Transient confusion was encountered during the hospital stay.  This was  felt to be secondary to MS Contin.  With discontinuation of the  patient's MS Contin, this delirium resolved completely.   As the patient's hospital course continued, her mobility improved  significantly.  Physical therapy and occupational therapy evaluated the  patient.  They felt that the patient was stable for discharge home with  home health physical therapy, occupational therapy, use of rolling  walker, and use of a 3-in-1.  These arrangements were made.  On November 15, 2007, vital signs were stable, and the patient was afebrile.  She  was therefore cleared for discharge home with followup with her primary  care physician.      Lonia Blood, M.D.  Electronically Signed     JTM/MEDQ  D:  11/15/2007  T:  11/15/2007  Job:  [id number]   cc:   Dineen Kid. Reche Dixon, M.D.  Fax: [phone]

## 2010-12-11 NOTE — H&P (Signed)
[name], [name] NO.:  [id number]   MEDICAL RECORD NO.:  [id number]          PATIENT TYPE:  INP   LOCATION:  0102                         FACILITY:  [facility]   PHYSICIAN:  Altha Harm, MDDATE OF BIRTH:  07-Dec-1935   DATE OF ADMISSION:  11/04/2007  DATE OF DISCHARGE:                              HISTORY & PHYSICAL   CHIEF COMPLAINT:  Generalized intractable pain, particularly back pain  and nausea and vomiting.   HISTORY OF PRESENT ILLNESS:  This is a 75 year old lady who was recently  hospitalized with severe back pain.  The patient was discharged with  Vicodin to follow up with Dr. Ophelia Charter.  The patient states that she saw  Dr. Ophelia Charter who increased her pain medication.  However, she is still  having significant back pain.  The patient states that Dr. Ophelia Charter has  made no plans for any surgical intervention.  However, the patient  states that her pain has been in the last 48 hours severe, 10 out of 10,  in her back, nonradiating.  However, she states that the pain has gotten  so intense that she is now hurting all over.  She also states that she  has been having nausea and vomiting since last night and she is throwing  up  yellowish-greenish contents.  The patient currently rates her pain  an 8 out of 10 despite 2 mg of Dilaudid approximately 2 hours ago.  The  patient is really unable to give a very good description of the pain.  She states she has pain in her back and she is hurting all over.  The  patient particularly identifies the right side of the chest.  I am asked  to see the patient and admit her for intractable pain.   Please note the patient has an elevated D-dimer, 2.94, which is less  than her D-dimer for the last  admission just a few weeks ago.  However,  in light of the tachycardia, the patient has in the complaints of right-  sided chest pain, this elevated D-dimer cannot be normal.   PAST MEDICAL HISTORY:  1. Diabetes type 2.  2.  Osteoarthritis.  3. Rheumatoid arthritis.  4. Gastroparesis.  5. Diabetic neuropathy.  6. DJD of the spine.  7. L5-S1 herniation.  8. Hypertension.  9. Total abdominal hysterectomy, bilateral salpingo-oophorectomy.   FAMILY HISTORY:  Documented in her old records.   SOCIAL HISTORY:  The patient resides by herself.  She denies any  tobacco, alcohol or drug use.  Prior to her last hospitalization she was  able to be independent in ADL level.   ALLERGIES:  She has no known drug allergies.   CURRENT MEDICATIONS:  1. Lisinopril 10 mg p.o. daily.  2. Elavil 25 mg p.o. at bedtime.  3. Methotrexate.  The patient is unable to give me the dosing.  4. Metoprolol 50 mg p.o. b.i.d.  5. Lyrica 75 mg p.o. daily.  6. Amaryl 4 mg p.o. b.i.d.  7. Vicodin p.r.n.  8. Pepcid 20 mg p.o. daily.  9. Hydroxyzine p.r.n.Marland Kitchen   PRIMARY CARE Jayan Raymundo:  [facility].   REVIEW OF SYSTEMS:  Twelve systems were reviewed and the patient has a  negative review of systems except for those noted in the HPI.   LABORATORY DATA:  Studies in the emergency room show the following:  Sodium 139, potassium 3.9, chloride 101, bicarb 28,  BUN 2, creatinine  0.43, glucose 129.  Hemogram shows a white blood cell count of 8.4,  hemoglobin of 14.9, hematocrit of 45.1, platelet count of 370,000.  Urinalysis is negative for any elements consistent with the urinary  tract infection.  However, the patient has ketones of greater than 80.  A D-dimer is elevated to 2.94.  A 12-lead EKG shows sinus tachycardia.   PHYSICAL EXAMINATION:  On the monitor the patient is in sinus  tachycardia, heart rate of 124.  GENERAL:  In general the patient appears in distress secondary to pain.  VITAL SIGNS:  Blood pressure 153/50, heart rate 129, respiratory rate  16, temperature 98.4, oxygen saturation 98% on 2 liters.  HEENT:  Normocephalic, atraumatic.  Pupils appear equally round and  reactive to light and accommodation.   Extraocular movements are intact.  Tympanic membranes translucent bilaterally with good landmarks.  Oropharynx is tacky.  No exudate, erythema or lesions are noted.  NECK:  Trachea is midline.  No masses, no thyromegaly.  No JVD, no  carotid bruit.  CHEST:  Clinically clear to auscultation.  No wheezes, crackles or  rhonchi.  CARDIOVASCULAR:  She has a normal S1 and S2.  No murmurs, rubs or  gallops.  The patient has tachycardia.  ABDOMEN:  Morbidly obese, nontender, nondistended.  No masses, no  hepatosplenomegaly noted.  The patient has normoactive bowel sounds.  MUSCULOSKELETAL:  The patient has osteoarthritic changes and rheumatoid  changes in addition to the upper extremities including both swan neck  and boutonniere deformities.  The patient has restriction of movements  in the cervical spine and lumbosacral spine secondary to pain.  NEUROLOGICAL:  She has no focal neurological deficits.  Cranial nerves  II-XII are grossly intact.  The patient is unable to participate in the  strength examination secondary to pain but she had to move both upper  extremities against gravity without any difficulty.  DTRs are depressed  in the bilateral lower extremities and 2+ in bilateral upper  extremities.  PSYCHIATRIC:  The patient is alert and oriented x3.  She had good  insight and cognition, good recent and remote recall.   ASSESSMENT/PLAN:  This is a patient who presents with:  1. Emesis.  2. Mild dehydration.  3. Sinus tachycardia.  4. Intractable pain.  The patient will be treated with IV Dilaudid for      her pain.  If the pain persists, may need to be on a PCA.  I will      also add a long acting  medication to the pain as soon as the      patient is able to tolerate p.o. intake.  I also recommended that      Dr. Ophelia Charter be consulted to see the patient in the morning.  5. Intractable nausea and vomiting.  Will check a lipase on the      patient.  In the meantime, the patient was given  bowel rest and      aggressive hydration and clear fluids introduced and diet advanced      as tolerated.  6. In terms of a elevated D-dimer, the patient has had elevated D-  dimer in the presence of a negative CT pulmonary angiogram      approximately 3 weeks ago.  However, given the patient's      tachycardia, which could conceivably be secondary to the pain that      she is having.  However, the tachycardia combined with the right-      sided chest pain and the elevated D-dimer would dictate that the      patient be ruled out for pulmonary embolus. The patient will be      given GI and DVT prophylaxis and she will be resumed on her usual      medications.  The patient does have an elevated blood pressure and      she has been taking 10 mg of lisinopril at home and will increase      that to 20 mg of lisinopril.      Altha Harm, MD  Electronically Signed     MAM/MEDQ  D:  11/04/2007  T:  11/05/2007  Job:  [id number]

## 2010-12-11 NOTE — H&P (Signed)
NAMENICKOLA, LENIG              ACCOUNT NO.:  [id number]   MEDICAL RECORD NO.:  [id number]          PATIENT TYPE:  INP   LOCATION:  1435                         [facility]ACILITY:  [facility]   PHYSICIAN:  Isidor Holts, M.D.  DATE OF BIRTH:  [birth year]   DATE OF ADMISSION:  09/21/2007  DATE OF DISCHARGE:                              HISTORY & PHYSICAL   PRIMARY MEDICAL DOCTOR:  Dr. Fannie Knee Drinkard, [facility].   CHIEF COMPLAINT:  Recurrent retrosternal discomfort for approximately 1  week, also low back pain and neck pain for months.   HISTORY OF PRESENT ILLNESS:  This is a 75 year old female.  History is  supplied by her, and she is a very good historian.  According to her,  she has had central chest discomfort on and off since September 16, 2007.  Usually, this occurs when she is lying in bed at night and is described  as pressure, associated with a smothering sensation I can't breathe.  The patient usually gets up and walks around for awhile, and symptoms  resolve.  She has also had pain at the back of her neck for several  months and is experiencing low back pain, radiating around to her  bilateral lower abdominal quadrants.  She describes this as similar to  her radiculopathic pain prior to her diskectomy in March 2008. She  states that these symptoms had resolved following surgery, but are now  back and worsened by ambulation.   PAST MEDICAL HISTORY:  1. Type 2 diabetes mellitus.  2. Osteoarthritis.  3. Rheumatoid arthritis.  4. Gastroparesis.  5. Diabetic neuropathy.  6. History of L5-S1 herniated nucleus pulposus state/radiculopathy      status post microdiskectomy September 27, 2006 by Dr. Ophelia Charter, orthopedic      surgeon.  7. Status post appendectomy 1970.  8. Status post total abdominal hysterectomy and bilateral salpingo-      oophorectomy in 1979.  9. Hypertension.   MEDICATIONS:  1. Amaryl 4 mg p.o. b.i.d.  2. Lisinopril 10 mg p.o. daily.  3. Celebrex 200 mg p.o. daily for 5  days from September 09, 2007 to      September 14, 2007, prescribed by her primary MD because of      worsening arthritic pains.   ALLERGIES:  NO KNOWN DRUG ALLERGIES.   REVIEW OF SYSTEMS:  As per HPI and chief complaint, otherwise negative.  The patient denies shortness of breath.  Denies abdominal pain, vomiting  or diarrhea.  Denies fever or chills.   SOCIAL HISTORY:  The patient resides alone, although she has a close  friend who lives nearby.  She is usually able to do all her ADLs, her  own cooking and shopping, until about 2 days ago.  The patient is quite  independent, ambulates without a cane.  She is a nonsmoker, nondrinker.  She has one son and one daughter.   FAMILY HISTORY:  The patient's mother died at age 92.  Father died at  age 66 years,  status post MI.  She has a sister who is an alcoholic and  a brother who has end-stage liver  disease.  Family history is otherwise  noncontributory.   PHYSICAL EXAMINATION:  VITALS:  Temperature 98.7, pulse 100 per minute,  regular, respiratory rate 20, BP 165/55 mmHg, pulse oximeter 55% on room  air.  The patient does not appear to be in obvious acute distress at  time of this evaluation, alert, communicative, not short of breath at  rest.  HEENT:  No clinical pallor.  No jaundice or conjunctival injection.  Throat is clear.  NECK:  Supple.  JVP not seen.  No palpable lymphadenopathy.  No palpable  goiter.  CHEST:  Clinically clear to auscultation.  No wheezes, no crackles.  HEART:  Sounds 1 and 2 are heard, normal, regular, no murmurs,  tachycardiac.  ABDOMEN:  Morbidly obese, old surgical scars are noted. Soft, nontender.  No palpable organomegaly, no palpable masses.  Normal bowel sounds.  LOWER EXTREMITY EXAMINATION:  No pitting edema.  Palpable peripheral  pulses.  MUSCULOSKELETAL:  The patient has generalized osteoarthritic changes, as  well as rheumatoid changes in the digits of the upper extremities  including Swan-neck  and boutonniere deformities.  She has restriction of  neck movements in all directions, secondary to pain, experiences pain to  lateral rotation of torso, as well as lumbosacral tenderness to  palpation.  Straight leg raising test appears mildly positive on the  right.  CENTRAL NERVOUS SYSTEM:  No focal neurologic deficit on gross  examination.   INVESTIGATIONS:  CBC:  WBC 5.8, hemoglobin 13.3, hematocrit 38.9,  platelets 207, troponin-I point of care less than 0.05.  Electrolytes:  Sodium 142, potassium 3.4, chloride 180, CO2 27, BUN 5, creatinine 0.56,  glucose 88.  D-dimer is elevated at 3.58.  BNP is less than a 30.  Urinalysis is negative.  Chest x-ray dated September 21, 2007 showed  chronically elevated right hemidiaphragm.  Initial films showed possible  subtle right lower lobe pneumonia.  However, repeat two-view chest x-ray  showed no acute abnormalities.  Chest CT angiogram dated September 21, 2007, showed no evidence of pulmonary embolism.  There was right lower  lobe scarring and accentuated dependent interstitial opacity, right  lower lobe, likely atelectasis.  There was also a mass versus gastric  diverticula, left lateral gastrohepatic ligament.  A 12-lead EKG dated  September 21, 2007, showed sinus rhythm regular, 117 per minute, left  axis deviation, no acute ischemic changes.   ASSESSMENT AND PLAN:  1. Atypical chest pain.  We shall admit the patient, place on      telemetric monitoring, commence proton pump inhibitor treatment.      The patient does have risk factors including age, morbid obesity,      and type 2 diabetes mellitus, as well as hypertension.  We shall      consult cardiology for possible risk stratification.   1. Rheumatoid arthritis.  The patient appears to have significant neck      and low back pain, likely secondary to a combination of DJD and      rheumatoid arthritis changes.  We shall do neck and lumbosacral MRI      to elucidate anatomy.   Meanwhile commence analgesics, and involve      PT/OT.   1. Gastric diverticulum versus abdominal mass on CT angiogram. Likely,      we shall evaluate this initially with upper GI series although GI      workup may be considered. We shall discuss this with radiologists.   1. Type 2 diabetes mellitus.  This appears  controlled.  We shall check      HbA1c for completeness, continue Amaryl in pre-admission dosage,      carbohydrate-modified diet, and manage with sliding scale insulin      coverage.   1. Hypertension.  This is uncontrolled.  We shall increase Lisinopril      to 20 mg p.o. daily and also add low-dose beta blocker.   Further management will depend on clinical course.      Isidor Holts, M.D.  Electronically Signed     CO/MEDQ  D:  09/21/2007  T:  09/21/2007  Job:  [id number]

## 2010-12-11 NOTE — Discharge Summary (Signed)
NAMECAMIL, [name]              ACCOUNT NO.:  [id number]   MEDICAL RECORD NO.:  [id number]          PATIENT TYPE:  INP   LOCATION:  1441                         FACILITY:  [facility]   PHYSICIAN:  Hillery Aldo, M.D.   DATE OF BIRTH:  07/12/1936   DATE OF ADMISSION:  11/04/2007  DATE OF DISCHARGE:                               DISCHARGE SUMMARY   INTERIM DISCHARGE SUMMARY:   DATE OF DISCHARGE:  Pending.   PRIMARY CARE PHYSICIAN:  Dineen Kid. Reche Dixon, M.D., [facility].   DISCHARGE DIAGNOSES:  1. Intractable back pain secondary to degenerative disk disease, T12      compression fracture, rheumatoid and osteoarthritis.  2  Nausea and vomiting secondary to gastroparesis and possible narcotic  side effect.  1. Upper respiratory infection with possible right lower lobe      pneumonia.  2. Type 2 diabetes.  3. Hypoglycemia.  4. Peripheral neuropathy.  5. Hypertension   DISCHARGE MEDICATIONS:  To be dictated time of actual discharge.   CONSULTATIONS:  Sharen Counter, interventional radiology.   BRIEF ADMISSION HISTORY OF PRESENT ILLNESS:  Patient is a 75 year old  female who has a past medical history of degenerative disk disease,  degenerative joint disease, osteoarthritis, and rheumatoid arthritis who  had recently been hospitalized for intractable back pain.  She was  subsequently discharged after an approximate week and one-half stay for  pain control on October 29, 2007.  She subsequently represents to the  emergency department with ongoing complaints of pain on November 04, 2007.  She was admitted for further evaluation and pain control.  For full  details, please see the dictated report done by Dr. Ashley Royalty.   PROCEDURES AND DIAGNOSTIC STUDIES:  1. CT angiogram of the chest on November 05, 2007, showed no evidence of      pulmonary embolism.  There was right lower lobe atelectasis and/or      pneumonia.  Mild left lower lobe atelectasis.  2. MRI of the thoracic spine on November 06, 2007,  showed a stable      appearance of the superior endplate compression fracture of T12.      There was slight progression of retropulsion of bone from the      superior endplate of T12, facing the ventral cerebrospinal fluid      without significant stenosis.  There was minimal inferior endplate      marrow changes at T11 which may be reactive.  Compression fracture      could not be excluded at this level.  There was multiple      hemangiomata with no additional fractures seen.  3. MRI of the lumbar spine and November 07, 2007, showed incomplete      visualization of the patient's T12 fracture.  No significant change      and multilevel spondylosis with predominately left-sided foraminal      lateral recess narrowing. Left-sided facet hypertrophy and disk      bulging are the predominant findings.   DISCHARGE LABORATORY VALUES:  To be dictated at time of actual  discharge.   HOSPITAL COURSE:  '#1.  INTRACTABLE  BACK PAIN:  Due to the patient's in tractable back  pain and known T12 compression fracture, interventional radiology was  consulted for consideration of kyphoplasty.  The patient was empirically  started on calcitonin and calcium therapy.  Dr. Miles Costain did see the  patient in consultation and plans to do possible kyphoplasty on Monday.  Pain control was achieved with p.r.n. narcotics and Toradol.  She was  also put on long-acting MS Contin.   #2.  NAUSEA, VOMITING, GASTROPARESIS:  The patient has a known history  of gastroparesis and did have significant nausea and vomiting prior to  admission and intermittently during her hospital stay.  Her Reglan dose  was increased due to her underlying gastroparesis.  It was also felt  that some of her nausea and vomiting due to side effects of narcotic  pain medications.   #3.  FEVER:  The patient's fever was felt to be due to underlying upper  respiratory infection and possible pneumonia.  There was no evidence of  diskitis on MRI scanning.   She was empirically started on Zosyn when she  developed fever.  Cultures were obtained of blood and urine which were  negative.  Given her chest x-ray findings, her antibiotics were  continued and switched over to p.o. Avelox.  She should complete 7 days  of therapy.   #4.  DIABETES WITH HYPOGLYCEMIC EPISODE:  The patient was initially  maintained on her usual dose of Amaryl.  Her Amaryl was decreased  secondary to hypoglycemia.   #5.  PERIPHERAL NEUROPATHY:  The patient was maintained on the usual  dose of Lyrica.   #6.  HYPERTENSION:  The patient's blood pressure has been reasonably  well controlled.  She has had some episodes of elevated blood pressure  that were thought to be secondary to pain.  Once her pain was adequately  controlled, her blood pressure normalized.   DISPOSITION:  The patient will be stable for discharge home once she  recuperates from her kyphoplasty.  Discharge Summary addendum will be  dictated at that time.      Hillery Aldo, M.D.  Electronically Signed     CR/MEDQ  D:  11/08/2007  T:  11/08/2007  Job:  [id number]   cc:   Dineen Kid. Reche Dixon, M.D.  Fax: [phone]

## 2010-12-11 NOTE — Consult Note (Signed)
[name], [name]              ACCOUNT NO.:  [id number]   MEDICAL RECORD NO.:  [id number]          PATIENT TYPE:  INP   LOCATION:  1435                         [facility]ACILITY:  [facility]   PHYSICIAN:  Madolyn Frieze. Jens Som, MD, FACCDATE OF BIRTH:  10/21/1935   DATE OF CONSULTATION:  09/21/2007  DATE OF DISCHARGE:                                 CONSULTATION   The patient is very pleasant 75 year old female with a past medical  history of diabetes, hypertension, hyperlipidemia, and rheumatoid  arthritis who we are asked to evaluate for chest pain.  The patient has  no prior cardiac history.  Over the past 3 months, she has had diffuse  body aches.  She describes these pains as a burning sensation.  It  occurs in her legs, lower abdomen, right lateral chest area, substernal  area, neck and head.  The pain improves with pain medications.  Her  chest pain is part of this.  However, it does increase with lying flat  as well as certain movements.  It has been essentially continuous for  the past 3 months although pain medicines do improve it at times.  There  is no associated nausea, vomiting, shortness of breath or diaphoresis  with the pain although she does have shortness of breath for the past 3  months as well.  The pain does not radiate.  It is not related to food.  Because the above, we are asked to further evaluate.   CURRENT MEDICATIONS:  1. Lisinopril 20 mg p.o. daily.  2. Lopressor 12.5 mg p.o. b.i.d.  3. Amaryl 4 mg p.o. b.i.d.  4. Aspirin 81 mg p.o. daily.  5. Lovenox 40 mg subcu daily.  6. Solu-Medrol.   She has no known drug allergies.   SOCIAL HISTORY:  She does not smoke nor does she consume alcohol.   FAMILY HISTORY:  Positive for coronary artery disease in her father and  her brother.   PAST MEDICAL HISTORY:  Significant for diabetes mellitus for  approximately 9 years.  She also has hypertension and hyperlipidemia.  She has a history of osteoarthritis and rheumatoid  arthritis. She has a  history of cholecystectomy, hysterectomy, appendectomy and back surgery.  She has had a history of pancreatitis as well.  I do have a Myoview from  July 08, 2003 that showed no ischemia or infarction and the ejection  fraction was 75%.   REVIEW OF SYSTEMS:  She denies any headaches, fevers, or chills.  There  is a no productive cough or hemoptysis. There is no dysphagia,  odynophagia, melena or hematochezia. There is no dysuria or hematuria.  There is no rash or seizure activity. There is no orthopnea or PND,  but  she does occasionally have pedal edema.  The remaining systems are  negative.   PHYSICAL EXAMINATION:  Blood pressure of 164/68 and pulse of 111.  She  has a temperature 99.5.  She is 93% on room air. She is well-developed  and well-nourished and appears to be mildly anxious at the time of  evaluation. She is otherwise in no acute distress.  SKIN:  Warm and dry.  BACK:  Normal other than previous surgery.  HEENT:  Normal with normal eyelids.  NECK:  Supple with normal __________ bilaterally.  No bruits noted.  There is no jugular distention and no thyromegaly noted.  CHEST:  Clear to auscultation with expansion.  CARDIOVASCULAR:  Tachycardic rate with a regular rhythm.  There are no  murmurs, rubs or gallops noted.  ABDOMEN:  Not tender or distended.  Positive bowel sounds.  No  hepatosplenomegaly, no mass appreciated. There is no abdominal bruit.  Note she is tender over the chest and states this reproduces her pain.  She is also tender over the right flank area.  She has 2+ femoral pulses  bilaterally and no bruits.  EXTREMITIES:  No edema and I could palpate no cords. She has 2+ dorsalis  pedis pulses bilaterally.  NEUROLOGIC:  Grossly intact.   LABORATORY DATA:  Her initial markers are normal.  Her BUN and  creatinine are 5 and 0.56 with potassium of 3.4.  Her BNP is less than  30.  Her D-dimer was elevated to 3.58 but a chest CT shows no  pulmonary  embolus. There is a mass versus gastric diverticulum. Her white blood  cell count is 5.8 with a hemoglobin of 13, hematocrit of 38.9.  her  platelet count is 267.  Her urinalysis is negative.  Her  electrocardiogram shows a sinus rhythm at a rate of 117. A prior  inferior infarct cannot be excluded.   DIAGNOSIS:  1. Chest pain - the patient's chest pain is not consistent with a      cardiac etiology.  Her electrocardiogram shows no ST changes,      although there is a question of a prior inferior infarct.  Initial      enzymes are negative.  Her pain has been almost continuous for the      past 3 months and it increases with palpation certain and      movements.  I think it may be musculoskeletal in etiology.  Given      that she has a question of inferior myocardial infarction on      electrocardiogram, we will plan to proceed with an echocardiogram.      If the echo and the enzymes are unremarkable then I would not      pursue further ischemia evaluation.  Again I think it may be      musculoskeletal versus rheumatologic and I will leave this to the      primary care service.  2. Diabetes mellitus - per the primary care service.  3. Hypertension - I would recommend continuing her antihypertensives      and adjusting as an as needed.  4. Hyperlipidemia - she will continue on her statin.  Note that she      has only been on this for 3 weeks and her symptoms have been longer      than that.  I therefore do not think this is related to myalgias      related to statin use.  5. Rheumatoid arthritis - per the primary care service.      Madolyn Frieze Jens Som, MD, [facility]  Electronically Signed     BSC/MEDQ  D:  09/21/2007  T:  09/22/2007  Job:  [id number]

## 2010-12-14 NOTE — Consult Note (Signed)
[name], [name]                         ACCOUNT NO.:  [id number]   MEDICAL RECORD NO.:  [id number]                   PATIENT TYPE:  INP   LOCATION:  5735                                 FACILITY:  [facility]   PHYSICIAN:  Vikki Ports, M.D.         DATE OF BIRTH:  [birth year]   DATE OF CONSULTATION:  09/06/2003  DATE OF DISCHARGE:                                   CONSULTATION   HISTORY OF PRESENT ILLNESS:  The patient is a 75 year old black female  diabetic with known gallstones who presented now with a new history of  nausea, vomiting, and epigastric discomfort.  Workup revealed elevated  amylase and lipase. Ultrasound does not show dilatation of the common bile  duct, but does show gallstones.   PAST MEDICAL HISTORY:  Significant for diabetes mellitus, hypertension,  history of appendectomy and TAH/BSO. Medications include Amaryl, Glucophage,  Neurontin, Lopressor, and Celebrex.   FAMILY HISTORY:  Noncontributory.   REVIEW OF SYSTEMS:  Significant for gastroparesis and occasional reflux.   PHYSICAL EXAMINATION:  GENERAL: She is an age appropriate white female in no  distress.  VITAL SIGNS: Blood pressure 137/85, heart rate 60.  HEENT: Benign. Normocephalic and atraumatic. Pupils equal, round, and  reactive to light. Conjunctiva are without injection.  NECK: Supple and soft without thyromegaly.  LUNGS: Clear to auscultation and percussion times two.  HEART: Regular rate and rhythm without murmurs, rubs, or gallops.  ABDOMEN: Soft, minimally tender in the epigastrium. Well healed lower  vertical midline incision. No hernia defects are noted.  EXTREMITIES: Normal gait and station. There is no clubbing, cyanosis, or  edema.   X-RAYS:  Ultrasound shows minimal ductal dilatation, but no stone.  Amylase  and lipase are 450 and 350 respectively.   IMPRESSION:  Biliary pancreatitis.   PLAN:  Bowel rest. Recheck amylase and lipase tomorrow. Plan for  laparoscopic  cholecystectomy with intraoperative cholangiogram.                                               Vikki Ports, M.D.    KRH/MEDQ  D:  09/06/2003  T:  09/06/2003  Job:  [id number]

## 2010-12-14 NOTE — H&P (Signed)
NAMEKHALIDAH, HERBOLD                         ACCOUNT NO.:  [id number]   MEDICAL RECORD NO.:  [id number]                   PATIENT TYPE:  INP   LOCATION:  5735                                 FACILITY:  [facility]   PHYSICIAN:  Hettie Holstein, D.O.                 DATE OF BIRTH:  1936-07-28   DATE OF ADMISSION:  09/05/2003  DATE OF DISCHARGE:                                HISTORY & PHYSICAL   PATIENT'S PHYSICIAN:  Willis Modena, M.D., at [facility].   HISTORY OF PRESENT ILLNESS:  Ms. Hansell presents today with midepigastric  and midsternal pain, as well as weight loss and chest pain.  This is a  pleasant, 75 year old, African American female who presented to the [facility] Emergency Department today with complaints of midepigastric pain, as  well as nausea and vomiting for the past two to three days.  She has weight  loss of approximately 25 pounds over the past few months.  She had a  hospital course back in the first part of December at which time she  underwent imaging studies.  There was a CT performed at that time that  revealed a radiologist's impression of cholelithiasis.  Ultrasound performed  in the emergency department revealed common bile duct dilatation and  pancreatic ductal dilatation with cholelithiasis.  No masses were reported.  The gallbladder wall was thickened.  There was dilated hepatic and  pancreatic duct 10 mm and the liver, spleen, and kidneys were reported as  normal otherwise.  Ms. Kubisiak otherwise states she has had no other  associated complaints.  She denied any fever, chills, or night sweats, only  some nausea and vomiting about two to three times a day for the past several  days.  As well in the emergency department, she was noted to have elevated  lipase of 304.  No amylase was done, but she had an elevated lipase.  On the  last admission, she was set for outpatient evaluation of suspected  gastroparesis.  A gastric emptying study was  performed and revealed delayed  gastric emptying.   PAST MEDICAL HISTORY:  She has a past medical history significant for:  1. Diabetes mellitus, type 2.  2. Osteoarthritis.  3. Delayed gastric emptying recently diagnosed.  4. Cholelithiasis.  5. Diabetic nephropathy effecting the left lower extremity.  6. She had undergo an adenosine stress test the last admission which was     negative for ischemia with an ejection fraction of 75%.  She also     underwent CT scanning of her chest at that time that revealed no evidence     of pulmonary embolus.   PAST SURGICAL HISTORY:  Significant for:  1. Status post open appendectomy in 1970.  2. Status post total abdominal hysterectomy and bilateral salpingo-     oophorectomy in 1979.   CURRENT MEDICATIONS:  1. Reglan.  2. Glucophage 500 mg p.o.  b.i.d. with meals.  3. Neurontin 300 mg p.o. b.i.d.  4. Ultram q.6h. p.r.n.  5. Lopressor 25 mg p.o. b.i.d.  6. Amaryl.  7. Celebrex.   ALLERGIES:  No known drug allergies.   SOCIAL HISTORY:  Ms. Opfer denies tobacco and alcohol.  [place]  She has a sibling that lives nearby.   FAMILY HISTORY:  Her mother died at age 52.  Her father died at age 26 from  an MI.  A sister is alive at age 29 with alcoholism.  A brother is alive  with end-stage liver disease.   REVIEW OF SYSTEMS:  The patient reports continued weight loss, though some  improvement with respect to her appetite.  She states that she has not been  feeling quite well since the first of December.  Otherwise she denies any  shortness of breath, any hematochezia, melena, hematemesis, coffee ground  emesis, or swelling of her extremities.  She does report improved  symptomatology with regards to her neuropathy.   PHYSICAL EXAMINATION:  VITAL SIGNS:  Blood pressure 124/68, temperature 98.2  degrees, heart rate 84, respirations 20, O2 saturations 99% on room air.  GENERAL APPEARANCE:  This is an  awake, alert, and oriented African American  female in no acute distress.  Nontoxic appearing.  HEENT:  Normocephalic and atraumatic.  Extraocular muscles are intact.  No  conjunctival pallor.  No jaundice.  NECK:  Supple and nontender.  No palpable thyromegaly.  HEART:  S1 and S2 are regular.  LUNGS:  Clear bilaterally.  ABDOMEN:  Soft.  There was some mild right upper quadrant tenderness to deep  palpation and midepigastric tenderness to palpation.  No rebound.  No  rigidity.  Bowel sounds normoactive.  EXTREMITIES:  No edema.  Peripheral pulses were symmetrical bilaterally.   LABORATORY DATA:  The EKG revealed sinus rhythm, rate of 105, and  nonspecific T wave abnormalities.  Ultrasound of the abdomen revealed  multiple gallstones with focal wall thickening, pancreatic and hepatic ducts  dilated with a diameter of 10 mm, and normal spleen and kidneys, as well as  aorta.  Point of care within normal limits.  Lipase of 304.  WBC 7.3,  hemoglobin 14.3, hematocrit 42.8, platelet count 322.  Sodium 139, potassium  3.5, chloride 104, CO2 28, BUN 10, creatinine 0.6, glucose 104, total  bilirubin 0.4, alkaline phosphatase 93, AST 44, ALT 22, total protein 7.0,  albumin 3.3, calcium 9.0.   IMPRESSION:  This is a 75 year old female with nausea, vomiting,  midepigastric, and right upper quadrant pain with evidence of pancreatitis  and ductal dilatation suggestive of obstructive etiology, perhaps gallstone  pancreatitis.  At this time, we are keeping her NPO and awaiting Dr.  Joanette Gula input with regards to ERCP for further diagnostic evaluation.  At present, we are going to add a CA19-9 to her laboratories that have  already been drawn.   PROBLEM LIST:  1. Neurologic.  No active issues with improved diabetic nephropathy with     respect to the left lower extremity.  2. Cardiovascular.  No active issues.  The patient was discharged home on    Lopressor 25 mg b.i.d.  Will continue this.   She had a negative adenosine     stress test in December of 2004.  3. Pulmonary.  No active issues.  4. Gastrointestinal.  As noted above, ruling out gallstone     pancreatitis/cholecystitis, as well as possible pancreatic outlet  obstruction and/or mass.  5. Genitourinary.  Renal function is stable.  6. Hematology/infectious disease.  No focal signs of infection.  Currently     she is not anemic.  If she does spike a temperature or show evidence of     inflammatory response, will initiate empiric antibiotic therapy.  7. Endocrine.  The patient is diabetic.  At this time, we are going to place     her on insulin sliding scale.  We are going to hold her metformin, as     well as the Amaryl for now as she is NPO.  8. Fluids, electrolytes, and nutrition.  At present she will remain NPO.  We     are going to start her on D5 normal saline, weigh her, and continue     evaluation for the etiology her weight loss.  9. Prophylaxis.  We will initiate DVT prophylaxis while she is confined to a     bed.                                                Hettie Holstein, D.O.    ESS/MEDQ  D:  09/06/2003  T:  09/06/2003  Job:  [id number]   cc:   Willis Modena, M.D.

## 2010-12-14 NOTE — Discharge Summary (Signed)
[name], [name]             ACCOUNT NO.:  [id number]   MEDICAL RECORD NO.:  [id number]          PATIENT TYPE:  INP   LOCATION:  1429                         FACILITY:  [facility]   PHYSICIAN:  Hind I Elsaid, MD      DATE OF BIRTH:  26-Nov-1935   DATE OF ADMISSION:  09/25/2006  DATE OF DISCHARGE:  09/24/2006                               DISCHARGE SUMMARY   DISCHARGE DIAGNOSES:  1. Lower back pain and left leg pain secondary to left L-4-S1      herniated nuclear pulposus, status post microdiskectomy.  2. Type 2 diabetes mellitus.  3. Rheumatoid arthritis.  4. Diabetic neuropathy.  5. Irregular heart beat.  6. Sciatica of the left lower extremity.   CONSULTATIONS:  __________  Orthopedics was consulted for the left back  pain for possible evaluation and surgery.   PROCEDURE:  Chest x-ray.  Low lung volume without focal disease.  Stable  degenerative change in the shoulder.  MRI of the lumbar spine:  Large left foraminal and extraforaminal  protrusion at L5-S1, could bulge on the exiting left L-5 root.  Disc  protrusion on the far right at L4-5, compared with L-4 root.  Multilevel  facet arthropathy.  CT angiogram which was negative for pulmonary embolus and no acute  process.   DISCHARGE MEDICATIONS:  1. Lisinopril 10 mg p.o. daily.  2. Amitriptyline 25 mg p.o. nightly  3. Methotrexate 10 mg p.o. total once each week.  4. Amaryl 4 mg p.o. b.i.d.  5. Metoprolol 50 mg p.o. b.i.d.  6. Vicodin 1 tab p.o. q. 4 to 6 hours.  7. Lyrica 75 mg p.o. daily.  8. Prednisone tapered dose to each 10 mg p.o. daily which is the      patient's own home medication.  9. Percocet 5/325 one tab q. 4 to 6 hours p.r.n. for pain.   HOSPITAL COURSE:  A 75 year old female, multiple medical problems.  History of severe rheumatoid arthritis.  Presented with low back pain  radiating into the left lower extremity.  Has a history of chronic low  back pain and has been evaluated by Dr. Ophelia Charter,  orthopedics.  The patient  admitted for six months the pain has been getting progressively worse,  associated with weakness of the left lower extremity.  Problem 1:  Left lower back pain with left leg pain and some kind of  weakness of the left leg.  MRI was done which showed the results as  above.  Dr. Ophelia Charter was consulted for possible surgery or nerve block.  The patient was started on aggressive pain medications.  Also started on  Solu-Medrol IV for possible exacerbation of severe rheumatoid arthritis.  Dr. Ophelia Charter saw the patient and he recommended microdissection of the left  L5-S1 microdiskectomy.  The patient on March 1 underwent successful left  L5-S1 microdiskectomy with good response.  During observation after the  procedure, the patient's pain completely resolved except mild pain on  the left foot.  The patient tolerated the procedure very nicely and  without complications.  The patient will be discharged home after  physical evaluation if  the patient needs any physical therapy at home.  Discharged with pain medication.  Follow-up with Dr. Ophelia Charter as an  outpatient.  Problem 2:  The patient had sinus tachycardia during hospitalization.  Possibly due to the severity of pain at that time.  The patient was kept  on a monitor.  TSH was drawn which was completely normal.  The patient  has already been taking beta blocker.  The returned to normal sinus  rhythm after control of pain and after the procedure.  TSH during  hospitalization was 0.356 which is normal for the patient.  The patient  is now in sinus rhythm and heart rate of 77.  Problem 3:  The patient had on the day of admission a low grade fever,  T. Max of 102.  Chest x-ray, urinalysis and blood cultures were  completely negative.  The patient was kept under observation for more  than three days with no further episode of fever and no white blood  cells.  We think this could also be due to pain.  Problem 4:  Diabetic  neuropathy.  The patient to continue her own home  medication.  Problem 5:  Severe rheumatoid arthritis with high ESR.  The patient  received a dose of IV steroids.  The patient to be discharged on tapered  dose of steroids and to follow with her rheumatologist.  The patient is  already on Methotrexate.  She should follow-up with her rheumatologist,  Methotrexate level and LFT.   DISPOSITION:  The patient is stable to be discharged home with pain  medication and to resume her own home medication.      Hind Bosie Helper, MD  Electronically Signed     HIE/MEDQ  D:  09/29/2006  T:  09/29/2006  Job:  [id number]

## 2010-12-14 NOTE — Consult Note (Signed)
[name], [name]                         ACCOUNT NO.:  [id number]   MEDICAL RECORD NO.:  [id number]                   PATIENT TYPE:  INP   LOCATION:  5735                                 FACILITY:  [facility]   PHYSICIAN:  Althea Grimmer. Luther Parody, M.D.            DATE OF BIRTH:  [birth year]   DATE OF CONSULTATION:  DATE OF DISCHARGE:                                   CONSULTATION   REFERRING PHYSICIAN:  Dr. Hettie Holstein.   REASON FOR CONSULTATION:  [name] is a 75 year old female whom I am  asked to see for abdominal pain presumably from pancreatitis.  She was  admitted to the hospital with nausea, vomiting and chest discomfort on  July 05, 2003.  She was discovered to have gastroparesis with 55%  retention at 120 minutes of scanning.  It was felt that her discomfort was  likely related to the gastroparesis.  A CT scan at that time did show  calcified gallstones but there was no evidence of common bile duct stones.  She reports to me that she has had abdominal pain and nausea and vomiting  since Saturday, however, she has not vomited since coming to the emergency  room.  In the ER, she had an ultrasonogram that demonstrated an increased  common bile duct of 13 mm with a prominent pancreatic duct.  She has  gallbladder wall thickening and multiple gallstones.  There is no clear  stone seen within the CBD, which is followed well into the pancreatic head.  There is a prominent pancreatic duct as well.  Her transaminases are near  normal with an SGOT of 56 but a normal SGPT; similarly, alkaline phosphatase  and bilirubin are normal.  Her amylase, however, is 448 and lipase 319.  White blood count 7.3.   PAST MEDICAL HISTORY:  Past medical history is pertinent for:  1. Gastroparesis.  2. Diabetes with neuropathy.  3. Reported weight loss.  4. Osteoarthritis.   PAST SURGICAL HISTORY:  1. Appendectomy.  2. TAH/BSO.   MEDICATIONS:  Medications on discharge in December included:  1. Reglan 10 mg before meals.  2. Glucophage 500 mg b.i.d.  3. Celebrex p.r.n.  4. Neurontin 300 mg b.i.d.  5. Ultram q.6 h. p.r.n.  6. Lopressor 12.5 mg b.i.d.  7. Unasyn has been ordered in hospital, along with sliding-scale insulin.   ALLERGIES:  None reported.   FAMILY HISTORY:  Noncontributory.   SOCIAL HISTORY:  Non-smoker, nondrinker, lives alone, retired.   REVIEW OF SYSTEMS:  GENERAL:  Unquantified weight loss.  ENDOCRINE:  Positive for history of diabetes, type 2.  SKIN:  No rash or pruritus.  EYES:  No icterus.  She recently has complained of diplopia.  ENT:  No  aphthous ulcers or chronic sore throat.  RESPIRATORY:  She does have some  dyspnea and orthopnea.  CARDIAC:  No chest pain, palpitations or history of  valvular heart disease.  GI:  As above.  GU:  No current dysuria or  hematuria.   PHYSICAL EXAMINATION:  VITAL SIGNS:  On physical exam, she is afebrile,  blood pressure 151/73, pulse 84 and regular.  SKIN:  Skin is normal.  HEENT:  Eyes anicteric.  Oropharynx unremarkable.  NECK:  Neck is supple without thyromegaly or adenopathy.  CHEST:  Chest sounds clear.  HEART:  Heart sounds:  Regular rate and rhythm.  ABDOMEN:  Abdomen is soft.  There are hypoactive bowel sounds.  There is  tenderness high in the epigastrium only.  RECTAL:  Exam is not performed.  EXTREMITIES:  Extremities without cyanosis, clubbing, edema or rash.   LABORATORY AND ACCESSORY CLINICAL DATA:  Electrocardiogram demonstrates  sinus tachycardia.   IMPRESSION:  [age]-year-old female with diabetes mellitus and  gastroparesis whose current symptoms seem to be due to biliary pancreatitis.  In light of her established pancreatitis, I do not believe that endoscopic  retrograde cholangiopancreatogram is currently warranted, particularly since  her liver function tests are essentially normal.  However, supportive care  is in order, along with following LFTs and pancreatic enzymes.   Surgical  consultation should be obtained and I think she should have a  cholecystectomy on this admission, since I suspect that her problems in  December were also related to biliary disease.  I have taken the liberty of  notifying the surgeon of her current problem and will discuss with him  whether or not endoscopic retrograde cholangiopancreatogram is warranted in  the next few days.                                               Althea Grimmer. Luther Parody, M.D.    PJS/MEDQ  D:  09/06/2003  T:  09/06/2003  Job:  [id number]   cc:   Hettie Holstein, D.O.  Fax: [phone]   [facility], Melony Overly.D.

## 2010-12-14 NOTE — Discharge Summary (Signed)
[name], [name]                         ACCOUNT NO.:  [id number]   MEDICAL RECORD NO.:  [id number]                   PATIENT TYPE:  INP   LOCATION:  5735                                 FACILITY:  [facility]   PHYSICIAN:  Hettie Holstein, D.O.                 DATE OF BIRTH:  30-Nov-1935   DATE OF ADMISSION:  09/05/2003  DATE OF DISCHARGE:  09/09/2003                                 DISCHARGE SUMMARY   ADMISSION DIAGNOSIS:  Pancreatitis with bile duct dilatation, suspect  gallstone pancreatitis.   DISCHARGE DIAGNOSES:  1. Biliary pancreatitis and acute cholecystitis, status post laparoscopic     cholecystectomy by Vikki Ports, M.D.  2. Elevated CA19-9 of 66.9 with recommendations by Dr. Luther Parody to have     this repeated in several weeks.  3. History of gastroparesis.  4. Diabetes with neuropathy.  5. Osteoarthritis.  6. Weight loss.  7. History of appendectomy in 1970.  8. History of abdominal hysterectomy and bilateral salpingo-oophorectomy in     1979.  9. History of adenosine stress test previous admission with an ejection     fraction of 75%.   DISCHARGE MEDICATIONS:  1. Neurontin 300 mg p.o. b.i.d.  2. Ultram 50 mg one p.o. q.6h. p.r.n.  3. Lopressor 25 mg p.o. b.i.d.  4. Glucophage 500 mg p.o. b.i.d. with meals.  5. Amaryl 2 mg p.o. daily.  6. Reglan 10 mg one p.o. q.6h. x1 week.  7. Percocet 5/325 mg one p.o. q.4-6h. p.r.n.  8. Phenergan 25 mg one p.o. q.4h. p.r.n. nausea.   The patient was instructed to stay on a more consistent carbohydrate diet  and she is instructed to contact a general surgeon for all questions  regarding postoperative problems, and she is instructed to follow up with  [facility] in a week and to call for appointment.   CONSULTATIONS THIS ADMISSION:  Vikki Ports, M.D., with [facility].   PROCEDURE PERFORMED:  Laparoscopic cholecystectomy.   HISTORY OF PRESENT ILLNESS:  [name] presented with epigastric  and  midsternal pain as well as weight loss and chest pain.  She is a 75 year old  African-American female who presented to the [facility] ER with complaints  of midepigastric pain as well as nausea and vomiting for the past two to  three days.  She has weight loss of approximately 25 pounds over the past  few months.  She had a hospital course back in the first part of December,  at which time she underwent imaging studies.  There was a CT performed at  that time that revealed radiologist's impression of cholelithiasis.  Ultrasound performed in the emergency department revealed common bile duct  dilatation and pancreatic duct dilatation with cholelithiasis.  No masses  were reported, and the gallbladder was thickened.  There was dilated hepatic  and pancreatic duct to 10 mm, and the liver, spleen, and kidneys were  reported  as normal otherwise.  [name] otherwise states she has had no  other essential complaints.  She denied any fevers, chills, nights sweats,  only some nausea and vomiting about two to three times a day for the past  several days as well as in the ED.  She was noted to have an elevated lipase  of 304.  On the last admission she was sent for outpatient evaluation of  suspected gastroparesis.  A gastric emptying study was performed and  revealed late gastric emptying.   HOSPITAL COURSE:  The patient was admitted and surgery was consulted, and  laparoscopic cholecystectomy was performed.  Gastroenterology was consulted  to see her as she had an elevated CA19-9 and subsequently her course was  without event.  Dr. Luther Parody recommended repeating this study in several  weeks.   LABORATORY DATA AT THE TIME OF DISCHARGE:  CA19-9 is 86.9.  Sodium 142,  potassium 3.6, BUN 2, creatinine 0.5, glucose 136.  Hemoglobin 12.6, WBC  5.7, platelet count of 275.                                                Hettie Holstein, D.O.    ESS/MEDQ  D:  10/11/2003  T:  10/13/2003  Job:   [id number]   cc:   Jola Schmidt. Luther Parody, M.D.  [address]  [place]  [place] [address]  Fax: [phone]

## 2010-12-14 NOTE — H&P (Signed)
NAMEAMENA, DOCKHAM                         ACCOUNT NO.:  [id number]   MEDICAL RECORD NO.:  [id number]                   PATIENT TYPE:  INP   LOCATION:  4733                                 FACILITY:  [facility]   PHYSICIAN:  Ara D. Tammi Klippel, M.D.                DATE OF BIRTH:  August 31, 1935   DATE OF ADMISSION:  07/05/2003  DATE OF DISCHARGE:                                HISTORY & PHYSICAL   PRIMARY CARE PHYSICIAN:  Candis Musa, M.D. at [facility]   CHIEF COMPLAINT:  Vertigo, nausea, vomiting, chest pain, dysuria.   HISTORY OF PRESENT ILLNESS:  The patient is a very pleasant 75 year old  African-American female with a multitude of complaints.  The patient has had  chronic arthralgias from osteoarthritis for years but since July 04, 2003  in the morning she has had vertigo with sensation of the room spinning  around her.  This has been accompanied by nausea and vomiting to such a  degree that the patient began to have chest pain that was mid sternal, did  not radiate, was worsened with movement, but was relieved with nitroglycerin  upon arrival to the ER.  Was described as a pressure on her chest that was  associated with nausea and shortness of breath, but no diaphoresis and  lasted throughout the day on July 04, 2003 and the morning of July 05, 2003.  These host of symptoms continued for the past two days so the patient  came to the emergency room for further evaluation.   ALLERGIES:  No known drug allergies.   MEDICATIONS:  1. Gabapentin 300 mg p.o. b.i.d.  2. Metformin unknown dose.  3. __________  mg p.o. b.i.d.  4. Methocarbamol 500 mg p.o. q.6h. p.r.n. pain.  5. Tramadol two tablets p.o. q.6h. p.r.n. pain.   PAST MEDICAL HISTORY:  1. Diabetes mellitus type 2.  2. Osteoarthritis.   PAST SURGICAL HISTORY:  1. Status post open appendectomy in 1970.  2. Status post TAH/BSO in 1979.   SOCIAL HISTORY:  The patient denies tobacco, alcohol, illicit  or IV drug  use.  [place].  Her brother lives  nearby in an apartment above her.  She is a retired Occupational psychologist.  She has  no pets and she has city water.   FAMILY HISTORY:  Mother deceased age 32 who was killed.  Father deceased age  24 from his first MI.  Sister alive, age 4, with alcoholism.  Brother  alive, age 5, with end-stage liver disease secondary to alcohol.   REVIEW OF SYSTEMS:  The patient admits to diplopia, scintillations, scotoma,  nausea, vomiting, vertigo, dysuria, polyuria, and urinary urgency, PND,  orthopnea up to two times per week which has been new over the past month,  dyspnea on exertion which is new and which the patient has been unable to  climb the 10  steps to her brother's apartment without stopping to catch her  breath which is new in onset, chills and sweats, chest pain as described  above, palpitations.  The patient denies headache, alopecia, visual acuity  changes, auditory acuity changes, epistaxis, lesions or ulcers, dysphagia,  odynophagia, shortness of breath at rest, cough, wheeze, hemoptysis,  exposure to people with TB, chest pain with exertion, hematemesis, coffee  ground emesis, diarrhea, constipation, bright red blood per rectum, melena,  hematuria, myalgias, joint swelling or deformity, lower extremity edema,  fevers.   PHYSICAL EXAMINATION:  VITAL SIGNS:  Tmax 98.3, Tcurrent 98.1, pulse supine  97 with a blood pressure of 163/76, upright blood pressure 169/96 with a  pulse of 142.  The patient had vertiginous symptoms after standing for two  minutes.  Respirations 16, pulse oximetry 98% on room air.  GENERAL:  The patient did not appear to be in pain.  Well-developed, well-  nourished 75 year old African-American female who appears her stated age in  no apparent distress, speaking in full sentences.  HEENT:  Head normocephalic, atraumatic without alopecia.  Eyes:  Pupils are  equal, round, and  reactive to light.  Extraocular muscles are intact and  icteric.  No injection.  No discharge.  Normal appearing conjunctiva.  Ears:  TMs clear bilaterally.  Nose:  No dried blood in the nares.  Mouth:  Moist  mucous membranes.  Uvula midline.  Oropharynx without erythema or exudate  with horrible dentition.  NECK:  Supple without any meningeal signs.  There is no thyromegaly, bruit,  or JVD.  Trachea is midline.  LUNGS:  Clear to percussion and auscultation bilaterally without rales,  rhonchi, or wheezes.  HEART:  Regular rate and rhythm S1 and S2 without murmurs, rubs, or gallops.  ABDOMEN:  Nondistended, well healed laparotomy scars appreciated.  Bowel  sounds present, nontender.  No guarding or rebound.  No hepatosplenomegaly.  No pulsatile masses.  No abdominal bruits appreciated.  EXTREMITIES:  No clubbing, cyanosis, edema.  Both legs measure 28 cm 10 cm  below the tibial tuberosity.  RECTAL:  Normal sphincter tone.  No masses.  Heme-negative.  NEUROLOGIC:  Cranial nerves II-XII grossly intact.  There are no focal or  gross motor or sensory deficits appreciated.   LABORATORY DATA:  White blood cells 5.3, H&H 14/42.8, platelet count  334,000.  Sodium 143, potassium 4.0, chloride 105, carbon dioxide 30, BUN 5,  creatinine 0.5, glucose 100, calcium 9.5.  Total bilirubin 0.5, alkaline  phosphatase 75, AST 25, ALT 19, total protein 7.3, albumin 3.6.  CK 94, MB  1.0, troponin less than 0.01.  EKG:  Sinus rhythm at a rate of 98 with  normal PR, QRS, and QTC intervals.  Axis of -19.  There is downward ST  sloping in aVL with Q-wave inversion and also Q-wave inversion in lead V1.  There are no other ST or T-wave changes.  No other signs of ischemia.  There  is no evidence for hypertrophy based upon voltage.  There is no prior EKG  for comparison.   ASSESSMENT/PLAN:  A 75 year old African-American female with chest pain, nausea, vomiting, vertigo, and tachycardia.  1.  Neurologic/psychiatric:  There are no signs or symptoms of meningitis     encephalitis or cerebrovascular accident.  The patient appears euthymic.     Currently no active issues.  2. Pulmonary:  No active issues.  3. Cardiovascular:  The patient's history is worrisome with her symptoms of     progressive dyspnea on  exertion, orthopnea, PND.  Will cycle the     patient's enzymes, place her on telemetry, check an a.m. EKG and a     fasting lipid and will start a Statin since the patient is a diabetic.     Will consult cardiology for a stress test or catheterization in the     morning, whichever is more appropriate.  The patient has symptoms of     orthostasis, but with normal blood pressure and markedly increased heart     rate of almost 50 beats per minute which is more consistent with postural     orthostatic tachycardia syndrome.  Will fluid rehydrate and reevaluate in     the morning.  Will also check a 2-D echocardiogram to evaluate left     ventricular function given her history.  4. Renal:  Will check a urine protein, creatinine, microalbumin and start     the patient on low dose ACE inhibitor for diabetic nephropathy.  5. Gastrointestinal:  The patient has normal LFTs, so will start with low     dose Statin therapy.  6. Continue all fluids, electrolytes, nutrition:  Will start the patient on     D5 half normal saline with 20 mEq of potassium since she will be n.p.o.     for test.  Will keep her potassium above 4 and her magnesium above 2.     Will start the patient on a 2200 kilocalorie ADA diet when she is able to     eat.  7. Infectious disease:  Will check a urinalysis and culture for urinary     tract infection symptoms.  If positive will treat with ciprofloxacin.  8. Hematology/oncology:  Will guaiac her stools x2 and check __________  to     evaluate for her reported 15 pound weight loss.  9. Endocrine:  Will Accu-Chek q.a.c. and q.h.s.  Start her on a Reglan     sliding  scale and continue with her Metformin at 500 mg p.o. b.i.d. and     will check a TSH and an A1C.  10.      Prophylaxis:  The patient will be full p.o. for gastrointestinal     prophylaxis when ready to eat.  Will start her on unfractionated heparin     at 5000 units b.i.d. for deep venous thrombosis prophylaxis.  11.      Disposition:  The patient is a full code.                                                Ara D. Tammi Klippel, M.D.    ADM/MEDQ  D:  07/05/2003  T:  07/06/2003  Job:  [id number]   cc:   Candis Musa, M.D.  [facility] [place]

## 2010-12-14 NOTE — Op Note (Signed)
[name], [name]                         ACCOUNT NO.:  [id number]   MEDICAL RECORD NO.:  [id number]                   PATIENT TYPE:  INP   LOCATION:  5735                                 FACILITY:  [facility]   PHYSICIAN:  Vikki Ports, M.D.         DATE OF BIRTH:  1936/01/26   DATE OF PROCEDURE:  09/07/2003  DATE OF DISCHARGE:                                 OPERATIVE REPORT   PREOPERATIVE DIAGNOSIS:  Biliary pancreatitis.   POSTOPERATIVE DIAGNOSIS:  1. Biliary pancreatitis.  2. Acute cholecystitis.   PROCEDURE:  Laparoscopic cholecystectomy with intraoperative cholangiogram.   SURGEON:  Vikki Ports, M.D.   ASSISTANT:  Ollen Gross. Carolynne Edouard, M.D.   DESCRIPTION OF PROCEDURE:  The patient was taken to the operating room and  placed in the supine position.  After adequate anesthesia was induced using  the endotracheal tube, the induced, the abdomen was prepped and draped in  the normal sterile fashion.  Using a transverse infraumbilical incision, I  dissected down to the fascia and opened it vertically.  An  Vicryl  pursestring suture was placed around the fascial defect.  The Hasson trocar  was placed in the abdomen, and the abdomen was insufflated with continuous  flow carbon dioxide.  Under direct vision, a 10 mm port was placed in the  subxiphoid region and two 5 mm ports were placed in the right abdomen.  The  gallbladder was identified.  It was very tense and was aspirated.  It was  then retracted superiorly.  There was a significant amount of edema and  inflammation down at the infundibulum but the infundibulum was identified.  It appeared that the cystic duct lay directly over the common duct, and  further dissection was not done.  The gallbladder was clipped, and a small  ductotomy was made in the very distal part of the gallbladder, early cystic  duct.  Cholangiogram was performed which showed an overlay of the cystic  duct over the common duct, free flow into  the duodenum, no filling defects.  Of note, the left hepatic was visualized only briefly during one of the  flush out phases.  The stump was then ligated with 0 PDS Endloops.  The  gallbladder was taken off the gallbladder bed using Bovie electrocautery,  placed in a Endocatch bag and removed through the umbilical port.  Pneumoperitoneum was released.  Adequate hemostasis was assured.  Infraumbilical fascia defect was closed.  The skin was closed with staples.  The incisions were injected using Marcaine.  The patient tolerated the  procedure well and went to PACU in good condition.                                               Vikki Ports, M.D.    KRH/MEDQ  D:  09/07/2003  T:  09/07/2003  Job:  [id number]

## 2010-12-14 NOTE — Op Note (Signed)
[name], [name]             ACCOUNT NO.:  [id number]   MEDICAL RECORD NO.:  [id number]          PATIENT TYPE:  INP   LOCATION:  1429                         FACILITY:  [facility]   PHYSICIAN:  Mark C. Ophelia Charter, M.D.    DATE OF BIRTH:  November 05, 1935   DATE OF PROCEDURE:  09/27/2006  DATE OF DISCHARGE:                               OPERATIVE REPORT   PREOPERATIVE DIAGNOSIS:  Left L5-S1 herniated nucleus pulposus.   POSTOPERATIVE DIAGNOSIS:  Left L5-S1 herniated nucleus pulposus.   PROCEDURE:  Left L5-S1 microdiskectomy.   SURGEON:  Annell Greening, MD.   ANESTHESIA:  GOT.   ESTIMATED BLOOD LOSS:  Minimal.   DESCRIPTION OF PROCEDURE:  After induction of general anesthesia,  orotracheal intubation, preoperative Ancef prophylaxis, the patient was  placed prone on chest rolls.  The area was squared with towels. After  DuraPrep, Betadine and Vi-Drape were applied.  A time-out was taken.  A  needle was placed, expected L5-S1 level and on a cross-table lateral x-  ray showed that the needle was low.  Moving up the appropriate distance,  a small incision was made 2-3 mm to the left of the midline,  subperiosteal dissection, palpation at the expected L5-S1 disk space was  performed, ligamentum was incised and a laminotomy was performed.  A  Penfield 4 was placed down the disk, cross-table lateral x-ray confirmed  that this was the L5-S1 level appropriately. The disk was very firm,  hard and bulging appeared chronic, it was not calcified. The disk was  incised, passes were made with the disk material removed.  The patient  had a foraminal and extraforaminal disk which was bothering the L5 nerve  root and a decision had to be been made for standard microdiskectomy  verses the far lateral approach.  With the patient having foraminal as  well as extraforaminal disk material, it was felt that a midline  procedure would get the foraminal disk component. Passes were made with  micropituitary and  Epstein curettes going laterally along the disk.  There was some endplate spurring off of L5 laterally which was removed  with the Kerrison under direct visualization. The nerve root at L5 was  palpated up underneath the pedicle above.  The S1 pedicle was visualized  and the foramen was palpated and the S1 nerve root exited freely.  Undercutting of the facets was performed with care taken not to remove  too much.  Bone was removed out to the level of the pedicle and then  undercut from that level directly out over the disk space.  The disk  were teased from the foramina and pulled in. With palpation of the  hockey stick, there was some spurring of the L5 vertebral body endplate  out laterally which was visualized on the MRI and flared out and was  chronic.  This was pushed down with the Epstein curettes curetting  pieces of it away to give more freedom.  Continued passes were made into  the disk space. A ball-tip nerve hook was used and some pieces of disk  were teased out of the foramina until the  foramina was open and free.  After irrigation with saline solution, passes were made anterior to the  dura in the midline with no areas of compression.  The facet had enough  portion of it and there  was no created pars defect. The fascia was closed with #0 Vicryl and 2-0  Vicryl in the subcutaneous tissue and then 4-0 subcuticular skin  closure.  Tincture of Benzoin, Steri-Strips and  Marcaine infiltration.  A postop dressing was applied.  Instrument count and needle count was  correct.      Mark C. Ophelia Charter, M.D.  Electronically Signed     MCY/MEDQ  D:  09/27/2006  T:  09/27/2006  Job:  [id number]

## 2010-12-14 NOTE — H&P (Signed)
NAMEDESHARA, [name]             ACCOUNT NO.:  [id number]   MEDICAL RECORD NO.:  [id number]          PATIENT TYPE:  INP   LOCATION:  0106                         FACILITY:  [facility]   PHYSICIAN:  Della Goo, M.D. DATE OF BIRTH:  [birth year]   DATE OF ADMISSION:  09/25/2006  DATE OF DISCHARGE:                              HISTORY & PHYSICAL   CHIEF COMPLAINT:  Severe low back pain and left leg pain.   HISTORY OF PRESENT ILLNESS:  This is a 75 year old female with multiple  medical problems, who was seen in the emergency department secondary to  complaints of severe unremitting low back pain radiating into the left  lower extremity.  The patient has a history of chronic low back pain and  has been evaluated by Dr. Ophelia Charter, orthopedics.  The patient has been  seeing Dr. Ophelia Charter over the past 6  months for severe low back pain.  Her  low back pain began approximately 6 years ago, of which she underwent a  workup; however, the pain returned approximately 6 months ago with  increased pain and weakness of the left lower extremity.  The patient  reports that the pain has been worse over the past week and has not been  controlled with her pain medication or regular medications.  The patient  describes the pain as being constant, crampy, burning pain which starts  in the lower back area and travels down the left hip into the lower left  leg.  She reports having mild weakness but has had difficulty bearing  weight on the left leg secondary to increased pain.  The patient reports  the pain intensity is a 10/10 at worst and is currently a 10/10.   The patient was evaluated in the emergency department, was administered  IV Dilaudid 2 mg with mild and transient relief.  The patient also had  been seen 24 hours earlier secondary to pain and chest pain and had been  released home to follow up with her primary care physician, which is  [facility].   PAST MEDICAL HISTORY:  1. Type 2 diabetes  mellitus.  2. Rheumatoid arthritis.  3. Diabetic neuropathy.  4. Chronic low back pain.  5. Irregular heart beat.  6. Sciatica, left lower extremity.   PAST SURGICAL HISTORY:  Status post cholecystectomy secondary to  gallstones.   MEDICATIONS:  1. Prednisone 10 mg one p.o. daily.  2. Lisinopril 10 mg one p.o. daily.  3. Amitriptyline 25 mg one p.o. q.h.s.  4. Methotrexate 10 mg total once each week.  5. Glimepiride 4 mg one p.o. b.i.d.  6. Metoprolol 50 mg one p.o. b.i.d.  7. Oxycodone/APAP p.r.n.  8. Lyrica 50 mg one p.o. daily.   ALLERGIES:  No known drug allergies.   SOCIAL HISTORY:  The patient lives at home with a son.  No history of  tobacco or alcohol usage.   FAMILY HISTORY:  Mother had hypertension.  Sister with diabetes mellitus  type 2.  Sister's daughter has breast cancer.   REVIEW OF SYSTEMS:  The patient has had fevers, chills, chest  discomfort.  No nausea or  vomiting.  No diarrhea.  Mild constipation.  No dizziness or syncope.  No seizures.  The symptoms that are also  mentioned above, related to pain.  The patient denies having any loss of  bowel or bladder function.   PHYSICAL EXAMINATION FINDINGS:  GENERAL:  A 75 year old mildly  overweight, well-developed female in discomfort but no acute distress.  VITAL SIGNS:  Temperature 100.9, blood pressure 150/82, heart rate 118-  133, respirations 18-20, and O2 saturations 93-97%.  HEENT:  Normocephalic, atraumatic.  Pupils equally round and reactive to  light.  There is no scleral icterus.  Extraocular muscles are intact.  Funduscopic is benign.  Oropharynx clear, no exudates or erythema.  NECK:  Supple, full range of motion.  No thyromegaly, adenopathy or  jugular venous distention.  CARDIOVASCULAR:  Tachycardic rate and rhythm.  No murmurs, gallops or  rubs.  LUNGS:  Clear to auscultation bilaterally.  ABDOMEN:  Positive bowel sounds, soft, nontender, nondistended.  EXTREMITIES:  Without edema.  RECTAL:   Deferred.  GENITOURINARY:  Deferred.  NEUROLOGIC:  The patient is alert and oriented x3.  Her speech is clear.  Cranial nerves are intact.  Motor and sensory function are intact.  There are no major sensory deficits of the left lower extremity.  Strength is a 5-/5 in the left lower extremity.  MUSCULOSKELETAL:  She has full range of motion in all extremities and  joints.   White blood cell count 8.9, hemoglobin 12.6, hematocrit 37.2, MCV 90.7,  platelets 272, neutrophils 54%, lymphocytes 36%.  Sodium 143, potassium  3.9, chloride 107, CO2 28, BUN 2, creatinine 0.46, glucose 145.  Cardiac  enzymes:  Myoglobin 34.5, CK-MB less than 1.0, troponin less than 0.05.  EKG reveals a sinus tachycardia without acute ST-T segment changes.  Other studies performed:  The patient underwent a CT of the lumbar spine  with a lumbar myelogram on July 30, 2006.  She had an MRI performed of  the lumbosacral spine on June 05, 2006.  She also has had a bone scan  which was performed, which revealed severe degenerative arthritis  changes.  Of note, the results of the CT of the lumbar spine on July 30, 2006, did reveal at the levels of L1-L2, advanced disk space  narrowing, mild facet arthropathy, no stenosis or disk protrusion, at  the level of L2-L3 a mild bulge, L3-L4 shallow central protrusion,  moderate facet arthropathy, no definite L4 nerve root encroachment; L4  to L5, shallow calcific disk protrusion centrally, no definite lateral  recess encroachment.  Marked asymmetric facet arthropathy on the left.  Significant bony foraminal narrowing affecting the left L4 nerve root.  At the level of L4 and S1, asymmetric facet arthropathy, also a central  osteophyte formation and central bulging annular fibers, biforaminal  narrowing due to disk space narrowing and facet arthropathy, left  greater than right, and also a large extraforaminal spur, potentially further compromising the left L5 nerve  root.   ASSESSMENT:  This is a 75 year old female being admitted with  intractable low back pain and sciatica of the left lower extremity.  Her  admitting diagnoses include:   1. Intractable low back pain and sciatica, left lower extremity.  2. Sinus tachycardia secondary to #1.  3. Hypertension.  4. Type 2 diabetes mellitus.  5. History of rheumatoid arthritis.  6. History of chronic low back pain.  7. History of neuropathy.   PLAN:  The patient will be admitted for pain control with IV Dilaudid,  which will be started at 3-4 mg IV q.4h. p.r.n. pain.  She will continue  on her regular medications at this time.  A consultation has been placed  with Dr. Ophelia Charter and her case has been discussed.  However, according to  results of her previous studies, the patient is not a surgical candidate  for her symptoms.  The patient will continue with medical management of  her condition.  The patient has been placed on DVT and GI prophylaxis  along with sliding scale coverage for her type 2 diabetes mellitus.      Della Goo, M.D.  Electronically Signed     HJ/MEDQ  D:  09/25/2006  T:  09/25/2006  Job:  [id number]   cc:   Veverly Fells. Ophelia Charter, M.D.  Fax: [phone]

## 2010-12-14 NOTE — Discharge Summary (Signed)
[name], [name]                         ACCOUNT NO.:  [id number]   MEDICAL RECORD NO.:  [id number]                   PATIENT TYPE:  INP   LOCATION:  4733                                 FACILITY:  [facility]   PHYSICIAN:  Hettie Holstein, D.O.                 DATE OF BIRTH:  [birth year]   DATE OF ADMISSION:  07/05/2003  DATE OF DISCHARGE:  07/08/2003                                 DISCHARGE SUMMARY   ADMISSION DIAGNOSES:  Persistent nausea and vomiting, chest pain,  tachycardia.   DISCHARGE DIAGNOSES:  1. Suspected gastroparesis.  2. Gallstones, status post  CT scan without evidence of common bile duct     dilatation.  3. Weight loss, needs further  evaluation  as an outpatient following     gastric emptying study.  4. Diabetic neuropathy affecting the left lower extremity.  5. Pleuritic chest pain, CT scan negative for pulmonary embolism and nuclear     Adenosine stress test negative for ischemia, ejection fraction 75%.   CONSULTS:  None.   PROCEDURES PERFORMED:  1. Adenosine stress test.  2. CT scan of the chest.  3. CT scan of the abdomen and pelvis.   HISTORY OF PRESENT ILLNESS:  The patient is a very pleasant 75 year old  African American female  with a multitude of complaints. The patient has had  chronic  arthralgias from osteoarthritis for years. Since July 04, 2003,  in the morning she had vertigo with the sensation of the room spinning  around her. This has been accompanied by nausea and vomiting to such a  degree that the patient began to have midsternal chest pain. It did not  radiate,  was worsened with movement but it was relieved with nitroglycerin  following arrival to the emergency room. It was described as pressure on her  chest. It was associated with nausea and shortness of breath but no  diaphoresis and lasted throughout the day. On July 04, 2003, and on the  morning of December 7, those symptoms continued for the past 2 days until  the patient  came to the emergency room for further evaluation.   HOSPITAL COURSE:  The patient was monitored on the telemetry floor and was  noted to be tachycardic. She had episodes of chest pain reported as  pleuritic, sometimes radiating to the back with multitudes of complaints.  She underwent a thorough evaluation  with a CT scan which revealed  no  evidence of pulmonary embolus. Subsequently she underwent  a CT scan of the  abdomen for suspected gallstones or choledocholithiasis. However, no  gallstones were noted and there was no evidence of common bile duct  dilatation.   The patient's nausea and vomiting were tolerated with antiemetics and being  n.p.o. It was suspected that she may be suffering from diabetic  gastroparesis. A gastric emptying study was ordered, however, this cannot be  performed until Monday. The patient requests  to be sent home and to come in  as an outpatient for the study. If it turns out this is not significant, we  will refer her to a general surgeon so that she can be evaluated for  cholecystectomy.   She underwent an echocardiographic evaluation  which revealed a normal  ejection fraction. On nuclear study she was noted to have an ejection  fraction of 75%. I suspect she probably has some degree of left ventricular  hypertrophy and probably some diastolic dysfunction. I am adding Lopressor  to her discharge  medications.   DISCHARGE MEDICATIONS:  Newest medications  include:  1. Reglan 10 mg p.o. every 30 minutes q. a.c. She was instructed very     clearly that she should take this before her meals.  2. Glucophage 500 mg p.o. b.i.d. with meals only.  3. She was instructed that she could continue Celebrex as before on a p.r.n.     basis, as this may adversely affect her renal function.  4. Neurontin 300 mg p.o. b.i.d.  5. Ultram as before q.6h. p.r.n.  6. Lopressor was added 25 mg p.o. b.i.d.  7. Ciprofloxacin 250 mg p.o. b.i.d. x2 more days  for urinary tract      infection, dispense 4.   LABORATORY DATA:  At the time of discharge included normal cardiac enzymes.  GGT 13. Bilirubin 0.6, alkaline phosphatase 65. Lipase 53. She was noted to  have a urinary tract infection with Proteus treated with 3 days of Cipro.  Sodium 141, potassium 4.2, BUN 3, creatinine 0.7, glucose 118, chloride 109,  CO2 26. T3 and T4 within normal limits, TSH minimally decreased. LDL was 65.  Cholesterol level 123.   CT scan negative for pulmonary embolism. CT scan revealed gallstones.   DISPOSITION:  It is recommended the patient have a follow up with her  primary care physician within the next couple weeks. She is scheduled for a  gastric emptying study and it is requested that her primary care physician  receive a copy of the results. This is Biomedical scientist. She has an appointment  on July 11, 2003, at [facility]. She is to be there at 10:45  a.m. for a gastric emptying study at 11 a.m. She was instructed to be n.p.o.  after midnight prior to this study.                                                Hettie Holstein, D.O.    ESS/MEDQ  D:  07/08/2003  T:  07/09/2003  Job:  [id number]

## 2011-04-19 LAB — BASIC METABOLIC PANEL
BUN: 6
CO2: 27
CO2: 28
Chloride: 101
Creatinine, Ser: 0.53
GFR calc non Af Amer: >60
Glucose, Bld: 231 — ABNORMAL HIGH
Glucose, Bld: 88
Potassium: 3.4 — ABNORMAL LOW
Sodium: 142

## 2011-04-19 LAB — URINALYSIS, ROUTINE W REFLEX MICROSCOPIC
Bilirubin Urine: NEGATIVE
Glucose, UA: NEGATIVE
Hgb urine dipstick: NEGATIVE
Ketones, ur: NEGATIVE
Protein, ur: NEGATIVE

## 2011-04-19 LAB — DIFFERENTIAL
Basophils Absolute: 0
Basophils Relative: 1
Neutro Abs: 3.3
Neutrophils Relative %: 57

## 2011-04-19 LAB — CARDIAC PANEL(CRET KIN+CKTOT+MB+TROPI): Troponin I: 0.05

## 2011-04-19 LAB — CBC
HCT: 36.6
MCHC: 33.5
MCV: 87.7
Platelets: 273
RBC: 4.44
RDW: 15
WBC: 7.3

## 2011-04-19 LAB — POCT CARDIAC MARKERS
Myoglobin, poc: 35.8
Operator id: 4708

## 2011-04-19 LAB — TSH: TSH: 0.362

## 2011-04-19 LAB — CK TOTAL AND CKMB (NOT AT ARMC): CK, MB: 0.5

## 2011-04-19 LAB — B-NATRIURETIC PEPTIDE (CONVERTED LAB): Pro B Natriuretic peptide (BNP): <30

## 2011-04-19 LAB — HEMOGLOBIN A1C: Hgb A1c MFr Bld: 6.2 — ABNORMAL HIGH

## 2011-04-23 LAB — COMPREHENSIVE METABOLIC PANEL
ALT: 13
AST: 26
Albumin: 3.1 — ABNORMAL LOW
Alkaline Phosphatase: 97
CO2: 28
Chloride: 101
Creatinine, Ser: 0.43
GFR calc Af Amer: >60
GFR calc non Af Amer: >60
Potassium: 3.9
Sodium: 139
Total Bilirubin: 0.8

## 2011-04-23 LAB — CULTURE, BLOOD (ROUTINE X 2): Culture: NO GROWTH

## 2011-04-23 LAB — CBC
HCT: 45.1
HCT: 32.5 — ABNORMAL LOW
Hemoglobin: 11.1 — ABNORMAL LOW
Hemoglobin: 11.4 — ABNORMAL LOW
Hemoglobin: 11.8 — ABNORMAL LOW
Hemoglobin: 14.9
Hemoglobin: 11 — ABNORMAL LOW
Hemoglobin: 11.5 — ABNORMAL LOW
MCHC: 34.5
MCHC: 31.1
MCHC: 33.8
MCV: 87.1
MCV: 87.9
MCV: 88.6
MCV: 88.9
MCV: 89.5
MCV: 88.3
MCV: 90.9
Platelets: 268
Platelets: 370
RBC: 3.72 — ABNORMAL LOW
RBC: 3.77 — ABNORMAL LOW
RBC: 3.85 — ABNORMAL LOW
RBC: 3.88
RDW: 14.8
RDW: 15.2
RDW: 14.4
RDW: 14.5
WBC: 5.9
WBC: 6.3
WBC: 6.3
WBC: 8.4

## 2011-04-23 LAB — DIFFERENTIAL
Basophils Absolute: 0
Eosinophils Absolute: 0.1
Eosinophils Absolute: 0.4
Eosinophils Relative: 2
Lymphocytes Relative: 30
Lymphs Abs: 1.7
Lymphs Abs: 2.3
Monocytes Absolute: 1.6 — ABNORMAL HIGH
Monocytes Absolute: 0.7
Neutro Abs: 4.1

## 2011-04-23 LAB — BASIC METABOLIC PANEL
BUN: <1 — ABNORMAL LOW
CO2: 28
CO2: 28
CO2: 29
CO2: 29
Calcium: 8.6
Calcium: 9
Calcium: 8.3 — ABNORMAL LOW
Chloride: 100
Chloride: 101
Chloride: 99
Creatinine, Ser: 0.45
Creatinine, Ser: 0.5
Creatinine, Ser: 0.51
GFR calc Af Amer: >60
GFR calc Af Amer: >60
GFR calc Af Amer: >60
GFR calc non Af Amer: >60
Glucose, Bld: 119 — ABNORMAL HIGH
Glucose, Bld: 81
Glucose, Bld: 95
Potassium: 3.8
Potassium: 3.7
Sodium: 139
Sodium: 135
Sodium: 136
Sodium: 139

## 2011-04-23 LAB — URINE CULTURE
Colony Count: 85000
Special Requests: POSITIVE
Special Requests: POSITIVE

## 2011-04-23 LAB — URINALYSIS, ROUTINE W REFLEX MICROSCOPIC
Bilirubin Urine: NEGATIVE
Bilirubin Urine: NEGATIVE
Glucose, UA: NEGATIVE
Glucose, UA: NEGATIVE
Glucose, UA: NEGATIVE
Hgb urine dipstick: NEGATIVE
Hgb urine dipstick: NEGATIVE
Ketones, ur: 15 — AB
Ketones, ur: >80 — AB
Leukocytes, UA: NEGATIVE
Nitrite: NEGATIVE
Protein, ur: 100 — AB
Protein, ur: NEGATIVE
Protein, ur: NEGATIVE
Specific Gravity, Urine: 1.017
Urobilinogen, UA: 0.2
Urobilinogen, UA: 0.2
pH: 6.5

## 2011-04-23 LAB — URINE MICROSCOPIC-ADD ON

## 2011-04-23 LAB — CK TOTAL AND CKMB (NOT AT ARMC)
CK, MB: 0.8
Relative Index: INVALID
Total CK: 75

## 2011-04-23 LAB — RAPID URINE DRUG SCREEN, HOSP PERFORMED: Tetrahydrocannabinol: NOT DETECTED

## 2011-04-23 LAB — PROTIME-INR
INR: 0.9
Prothrombin Time: 12.8
Prothrombin Time: 13.5

## 2011-04-23 LAB — LIPASE, BLOOD: Lipase: 30

## 2011-04-23 LAB — D-DIMER, QUANTITATIVE: D-Dimer, Quant: 2.94 — ABNORMAL HIGH

## 2011-05-06 LAB — CBC
MCHC: 34
MCV: 86.8
Platelets: 255
RDW: 15.6 — ABNORMAL HIGH

## 2011-05-06 LAB — URINALYSIS, ROUTINE W REFLEX MICROSCOPIC
Bilirubin Urine: NEGATIVE
Ketones, ur: NEGATIVE
Nitrite: NEGATIVE
Protein, ur: NEGATIVE

## 2011-05-06 LAB — DIFFERENTIAL
Basophils Absolute: 0.1
Lymphs Abs: 2.6
Monocytes Absolute: 0.3

## 2011-05-09 LAB — URINALYSIS, ROUTINE W REFLEX MICROSCOPIC
Bilirubin Urine: NEGATIVE
Ketones, ur: NEGATIVE
Nitrite: POSITIVE — AB
Urobilinogen, UA: 0.2
pH: 5.5

## 2011-05-09 LAB — URINE MICROSCOPIC-ADD ON

## 2012-05-04 ENCOUNTER — Encounter (HOSPITAL_COMMUNITY): Payer: Self-pay | Admitting: Neurology

## 2012-05-04 ENCOUNTER — Emergency Department (HOSPITAL_COMMUNITY)
Admission: EM | Admit: 2012-05-04 | Discharge: 2012-05-04 | Disposition: A | Discharge status: Home / Self Care | Payer: No Typology Code available for payment source | Attending: Emergency Medicine | Admitting: Emergency Medicine

## 2012-05-04 DIAGNOSIS — E119 Type 2 diabetes mellitus without complications: Secondary | ICD-10-CM | POA: Insufficient documentation

## 2012-05-04 DIAGNOSIS — R269 Unspecified abnormalities of gait and mobility: Secondary | ICD-10-CM | POA: Insufficient documentation

## 2012-05-04 DIAGNOSIS — M25569 Pain in unspecified knee: Secondary | ICD-10-CM | POA: Insufficient documentation

## 2012-05-04 DIAGNOSIS — Z79899 Other long term (current) drug therapy: Secondary | ICD-10-CM | POA: Insufficient documentation

## 2012-05-04 DIAGNOSIS — M1710 Unilateral primary osteoarthritis, unspecified knee: Secondary | ICD-10-CM | POA: Insufficient documentation

## 2012-05-04 DIAGNOSIS — M1990 Unspecified osteoarthritis, unspecified site: Secondary | ICD-10-CM

## 2012-05-04 DIAGNOSIS — M2540 Effusion, unspecified joint: Secondary | ICD-10-CM | POA: Insufficient documentation

## 2012-05-04 DIAGNOSIS — I10 Essential (primary) hypertension: Secondary | ICD-10-CM | POA: Insufficient documentation

## 2012-05-04 HISTORY — DX: Essential (primary) hypertension: I10

## 2012-05-04 HISTORY — DX: Unspecified osteoarthritis, unspecified site: M19.90

## 2012-05-04 LAB — URINALYSIS, ROUTINE W REFLEX MICROSCOPIC
Bilirubin Urine: NEGATIVE
Glucose, UA: NEGATIVE mg/dL
Hgb urine dipstick: NEGATIVE
Specific Gravity, Urine: 1.012 (ref 1.005–1.030)
Urobilinogen, UA: 0.2 mg/dL (ref 0.0–1.0)
pH: 8.5 — ABNORMAL HIGH (ref 5.0–8.0)

## 2012-05-04 LAB — CBC WITH DIFFERENTIAL/PLATELET
Basophils Relative: 1 % (ref 0–1)
HCT: 39.3 % (ref 36.0–46.0)
Hemoglobin: 13.3 g/dL (ref 12.0–15.0)
Lymphocytes Relative: 36 % (ref 12–46)
Lymphs Abs: 2.9 10*3/uL (ref 0.7–4.0)
MCHC: 33.8 g/dL (ref 30.0–36.0)
Monocytes Absolute: 0.6 10*3/uL (ref 0.1–1.0)
Monocytes Relative: 7 % (ref 3–12)
Neutro Abs: 4.6 10*3/uL (ref 1.7–7.7)
Neutrophils Relative %: 55 % (ref 43–77)
RBC: 4.51 MIL/uL (ref 3.87–5.11)

## 2012-05-04 LAB — BASIC METABOLIC PANEL
BUN: 5 mg/dL — ABNORMAL LOW (ref 6–23)
Chloride: 102 mEq/L (ref 96–112)
GFR calc Af Amer: >90 mL/min (ref >90)
GFR calc non Af Amer: >90 mL/min (ref >90)
Potassium: 3.7 mEq/L (ref 3.5–5.1)
Sodium: 141 mEq/L (ref 135–145)

## 2012-05-04 LAB — URINE MICROSCOPIC-ADD ON

## 2012-05-04 MED ORDER — HYDROMORPHONE HCL PF 1 MG/ML IJ SOLN
1.0000 mg | Freq: Once | INTRAMUSCULAR | Status: AC
  Administered 2012-05-04: 1 mg via INTRAVENOUS
  Filled 2012-05-04: qty 1

## 2012-05-04 MED ORDER — SODIUM CHLORIDE 0.9 % IV SOLN
INTRAVENOUS | Status: DC
  Administered 2012-05-04: 11:00:00 via INTRAVENOUS

## 2012-05-04 MED ORDER — ONDANSETRON HCL 4 MG/2ML IJ SOLN
4.0000 mg | Freq: Once | INTRAMUSCULAR | Status: AC
  Administered 2012-05-04: 4 mg via INTRAVENOUS
  Filled 2012-05-04: qty 2

## 2012-05-04 MED ORDER — METHYLPREDNISOLONE SODIUM SUCC 125 MG IJ SOLR
125.0000 mg | Freq: Once | INTRAMUSCULAR | Status: AC
  Administered 2012-05-04: 125 mg via INTRAVENOUS
  Filled 2012-05-04: qty 2

## 2012-05-04 MED ORDER — MORPHINE SULFATE 2 MG/ML IJ SOLN
2.0000 mg | Freq: Once | INTRAMUSCULAR | Status: AC
  Administered 2012-05-04: 2 mg via INTRAVENOUS
  Filled 2012-05-04: qty 1

## 2012-05-04 MED ORDER — KETOROLAC TROMETHAMINE 30 MG/ML IJ SOLN
30.0000 mg | Freq: Once | INTRAMUSCULAR | Status: AC
  Administered 2012-05-04: 30 mg via INTRAVENOUS
  Filled 2012-05-04: qty 1

## 2012-05-04 MED ORDER — TRAMADOL HCL 50 MG PO TABS: 50.0000 mg | ORAL_TABLET | Freq: Four times a day (QID) | ORAL | Status: DC | PRN

## 2012-05-04 MED ORDER — LISINOPRIL 20 MG PO TABS: 20.0000 mg | ORAL_TABLET | Freq: Every day | ORAL | Status: DC

## 2012-05-04 NOTE — ED Notes (Addendum)
Pt comes from home, c/o generalized pain. Reporting can't walk, pt got up and walked to ambulance stretcher. 168/100 BP, HR 90. Denying any cp or sob. Moving all extremities. No injuries noted. A x 4. Pt reporting this generalized pain has been going on for 2 days, reports chronic generalized pain. Has been taking tramadol but has been out of rx for 2 days. Pt was [facility] patient.

## 2012-05-04 NOTE — ED Notes (Signed)
Family at beside, pt resting, has been medicated.

## 2012-05-04 NOTE — Progress Notes (Signed)
Clinical Social Work-CSW met with pt and CM at bedside to review d/c plan-pt lives alone however receives significant  assistance from niece and neighbors. Pt relays that she has not had her medication since [facility] closed and that is why she was initially hesitant to return home.  CSW, CM and pt reviewed her d/c plan including confirming medication availability through a new pharmacy; referral form for new medicare provider; as well as HH PT/OT and SW for safety. Pt relays she is comfortable with her rolling walker however would be interested in any other equipment that she may be eligible for and is open to home health evaluations and assistance. Pt niece will be picking pt up after her own MD appointment and providing her with transportation home- No further CSW needs at this time- Jodean Lima, [phone]

## 2012-05-04 NOTE — ED Notes (Signed)
Community Liaison spoke to pt about which physicians she is able to go see since [facility] is no longer in business.

## 2012-05-04 NOTE — ED Notes (Addendum)
Pt provided bag lunch and ginger ale. Waiting for niece to pick her up. Will wait in POD C until ride comes.

## 2012-05-04 NOTE — ED Provider Notes (Addendum)
History     CSN: [id number]  Arrival date & time 05/04/12  4098   First MD Initiated Contact with Patient 05/04/12 0957      Chief Complaint  Patient presents with  . generalized pain     (Consider location/radiation/quality/duration/timing/severity/associated sxs/prior treatment) The history is provided by the patient.   76 year old, female, with a history of hypertension, arthritis, and diabetes.  Presents emergency department complaining of severe joint pain.  She says it has progressed over the past several days to the point that she is unable to stand up.  Her pain is most severe in her knees, and on the left greater than right.  She denies chest pain, abdominal pain, cough, shortness of breath, nausea, vomiting, or urinary tract symptoms.  She does not have a headache.  Past Medical History  Diagnosis Date  . Arthritis   . Hypertension   . Diabetes mellitus     Past Surgical History  Procedure Date  . Back surgery   . Abdominal hysterectomy     No family history on file.  History  Substance Use Topics  . Smoking status: Never Smoker   . Smokeless tobacco: Not on file  . Alcohol Use: No    OB History    Grav Para Term Preterm Abortions TAB SAB Ect Mult Living                  Review of Systems  Constitutional: Negative for fever and chills.  HENT: Negative for neck pain.   Respiratory: Negative for cough and shortness of breath.   Cardiovascular: Negative for chest pain.  Gastrointestinal: Negative for nausea, vomiting and abdominal pain.  Genitourinary: Negative for dysuria.  Musculoskeletal: Positive for joint swelling, arthralgias and gait problem.       She states that she is unable to walk because the pain in her knees.  This is severe  Neurological: Negative for headaches.  Hematological: Does not bruise/bleed easily.  Psychiatric/Behavioral: Negative for confusion.  All other systems reviewed and are negative.    Allergies  Review of  patient's allergies indicates no known allergies.  Home Medications   Current Outpatient Rx  Name Route Sig Dispense Refill  . LISINOPRIL 40 MG PO TABS Oral Take 40 mg by mouth daily.    Marland Kitchen SALSALATE 500 MG PO TABS Oral Take 500 mg by mouth 3 (three) times daily as needed. For pain    . TRAMADOL HCL 50 MG PO TABS Oral Take 50 mg by mouth every 6 (six) hours as needed. For pain      BP 190/94  Pulse 85  Temp 98.6 F (37 C) (Oral)  Resp 14  SpO2 96%  Physical Exam  Nursing note and vitals reviewed. Constitutional: She is oriented to person, place, and time. She appears well-developed and well-nourished. No distress.  HENT:  Head: Normocephalic and atraumatic.  Eyes: Conjunctivae normal are normal.  Neck: Normal range of motion. Neck supple.  Cardiovascular: Normal rate, regular rhythm and intact distal pulses.   No murmur heard. Pulmonary/Chest: Effort normal and breath sounds normal.  Abdominal: Soft. She exhibits no distension. There is no tenderness.  Musculoskeletal: Normal range of motion. She exhibits tenderness. She exhibits no edema.       Left knee tenderness, with no swelling, effusion, or discoloration.  Neurological: She is alert and oriented to person, place, and time.  Skin: Skin is warm and dry. No erythema.  Psychiatric: She has a normal mood and affect. Thought content  normal.    ED Course  Procedures (including critical care time) 76 year old, female, with arthritis exacerbation.  Nontoxic.  No systemic symptoms  Labs Reviewed  BASIC METABOLIC PANEL - Abnormal; Notable for the following:    Glucose, Bld 104 (*)     BUN 5 (*)     Creatinine, Ser 0.48 (*)     All other components within normal limits  CBC WITH DIFFERENTIAL  URINALYSIS, ROUTINE W REFLEX MICROSCOPIC   No results found.   No diagnosis found.  2:15 PM  i attempted to get pt to stand and then to walk.  She says the pain is too severe. She sits down and cries and says she cannot go home.     I spoke with the case manager. She will see pt and contact SW as well to see about placement.   The patient was seen in the emergency department by both the social worker and case manager.  They arrange for her to get her.  Outpatient medications and also to have social worker consultation and physical therapy consultation at home.  The patient felt better about this plan and agrees that she can go home.  Now.  MDM  Chronic arthritis        Cheri Guppy, MD 05/04/12 1117  Cheri Guppy, MD 05/04/12 1416  Cheri Guppy, MD 05/04/12 1525

## 2012-05-05 NOTE — Progress Notes (Signed)
HOME HEALTH AGENCIES SERVING [place]   Agencies that are Medicare-Certified and are affiliated with [facility]  Telephone Number Address  [facility]   The [facility] has ownership interest in this company; however, you are under no obligation to use this agency. [phone] or  [phone] [address] [place], [place] [address]   Agencies that are Medicare-Certified and are not affiliated with [facility] Telephone Number Address  [facility] [phone] Fax [phone] [address] [place], [place]  [address]  [facility] [phone] or [phone] Fax [phone] [address] [place], [place] [address]  [facility] [phone] Fax [phone] [address] [place], [place] [address]  [facility] [phone] Fax [phone] [address] [place], [place]  [address]  [facility] [phone] Fax [phone] [address], Suite [place], [place] [address]  [facility] [phone] [address] [place], [place] [address]  [facility] [phone]  [address] [place], [place] [address]  [facility] [phone] or [phone] Fax [phone] [address] [place], [place]  [address]  [facility] [phone] Fax [phone] [address] [place], [place]  [address]  [facility]  [phone] Fax [phone] [address] [place], [place] [address]               Agencies that are not Medicare-Certified and are not affiliated with [facility] Agency Telephone Number Address  [facility] [place] [phone] or [phone] Fax [phone] [address] [place]  [address]  [facility] [phone] Fax [phone] [address] [place], [place]  [address]  [facility]  [phone] Fax [phone] [address] [place], [place] [address]  [facility] [phone] Fax [phone] [address] [place], [place] [address]  [facility] [phone] or [phone] Fax [phone] [address] [place], [place]  [address]  [facility] [phone] or [phone] Fax [phone] [address], [address] [place], [place]  [address]  [facility] [phone] Fax [phone] [address] [place], [place]  [address]  [facility] [phone] [address] [place], [place]  [address]  [facility] [phone] Fax [phone] [address] [place], [place]  [address]  [facility] [phone] Fax [phone] [address] [place], [place]  [address]  [facility] [phone] Fax [phone] [address] [place], [place] [address]  [facility] [phone] Fax [phone] [address] [place], [place]  [address]   Called patient at home to offer home health services. Choice of agency given to patient. [facility].  Hilda Lias, RN with [facility] notified of referral.

## 2012-05-15 ENCOUNTER — Emergency Department (HOSPITAL_COMMUNITY)
Admission: EM | Admit: 2012-05-15 | Discharge: 2012-05-15 | Disposition: A | Discharge status: Home / Self Care | Payer: No Typology Code available for payment source | Attending: Emergency Medicine | Admitting: Emergency Medicine

## 2012-05-15 DIAGNOSIS — M129 Arthropathy, unspecified: Secondary | ICD-10-CM | POA: Insufficient documentation

## 2012-05-15 DIAGNOSIS — M1990 Unspecified osteoarthritis, unspecified site: Secondary | ICD-10-CM

## 2012-05-15 DIAGNOSIS — G8929 Other chronic pain: Secondary | ICD-10-CM | POA: Insufficient documentation

## 2012-05-15 DIAGNOSIS — I10 Essential (primary) hypertension: Secondary | ICD-10-CM | POA: Insufficient documentation

## 2012-05-15 DIAGNOSIS — E119 Type 2 diabetes mellitus without complications: Secondary | ICD-10-CM | POA: Insufficient documentation

## 2012-05-15 LAB — URINALYSIS, ROUTINE W REFLEX MICROSCOPIC
Bilirubin Urine: NEGATIVE
Glucose, UA: NEGATIVE mg/dL
Hgb urine dipstick: NEGATIVE
Ketones, ur: NEGATIVE mg/dL
Leukocytes, UA: NEGATIVE
Nitrite: NEGATIVE
Protein, ur: NEGATIVE mg/dL
Specific Gravity, Urine: 1.019 (ref 1.005–1.030)
Urobilinogen, UA: 0.2 mg/dL (ref 0.0–1.0)
pH: 8 (ref 5.0–8.0)

## 2012-05-15 LAB — URINE MICROSCOPIC-ADD ON

## 2012-05-15 LAB — BASIC METABOLIC PANEL
BUN: 6 mg/dL (ref 6–23)
CO2: 31 mEq/L (ref 19–32)
Calcium: 9.2 mg/dL (ref 8.4–10.5)
Chloride: 103 mEq/L (ref 96–112)
Creatinine, Ser: 0.53 mg/dL (ref 0.50–1.10)
GFR calc Af Amer: >90 mL/min (ref >90)
GFR calc non Af Amer: >90 mL/min (ref >90)
Glucose, Bld: 98 mg/dL (ref 70–99)
Potassium: 3.3 mEq/L — ABNORMAL LOW (ref 3.5–5.1)
Sodium: 143 mEq/L (ref 135–145)

## 2012-05-15 LAB — CBC
HCT: 40.5 % (ref 36.0–46.0)
Hemoglobin: 13.4 g/dL (ref 12.0–15.0)
MCH: 29.1 pg (ref 26.0–34.0)
MCHC: 33.1 g/dL (ref 30.0–36.0)
MCV: 88 fL (ref 78.0–100.0)
Platelets: 296 10*3/uL (ref 150–400)
RBC: 4.6 MIL/uL (ref 3.87–5.11)
RDW: 13.7 % (ref 11.5–15.5)
WBC: 7.5 10*3/uL (ref 4.0–10.5)

## 2012-05-15 MED ORDER — HYDROCODONE-ACETAMINOPHEN 5-325 MG PO TABS: 1.0000 | ORAL_TABLET | Freq: Three times a day (TID) | ORAL | Status: DC | PRN

## 2012-05-15 MED ORDER — MORPHINE SULFATE 4 MG/ML IJ SOLN
4.0000 mg | Freq: Once | INTRAMUSCULAR | Status: AC
  Administered 2012-05-15: 4 mg via INTRAMUSCULAR
  Filled 2012-05-15: qty 1

## 2012-05-15 NOTE — ED Provider Notes (Signed)
History     CSN: [id number]  Arrival date & time 05/15/12  0865   First MD Initiated Contact with Patient 05/15/12 [phone]      Chief Complaint  Patient presents with  . Arthritis    (Consider location/radiation/quality/duration/timing/severity/associated sxs/prior Treatment) HPI The patient presents to the emergency department with chronic arthritis pain.  The patient was last seen in the emergency department for the same complaint 05/04/12.  She was discharged with tramadol and states this only helped for the first 2 days.  She states the aching pain has not improved.  It is located in her shoulders bilaterally and from the knees down affecting the left more than the right.  She states she is having difficulty ambulating over the past week.  The patient denies injury or fall, fever, chills, weakness, numbness, erythema, swelling, and rash.    Past Medical History  Diagnosis Date  . Arthritis   . Hypertension   . Diabetes mellitus     Past Surgical History  Procedure Date  . Back surgery   . Abdominal hysterectomy     No family history on file.  History  Substance Use Topics  . Smoking status: Never Smoker   . Smokeless tobacco: Not on file  . Alcohol Use: No    OB History    Grav Para Term Preterm Abortions TAB SAB Ect Mult Living                  Review of Systems All pertinent positives and negatives in the history of present illness   Allergies  Review of patient's allergies indicates no known allergies.  Home Medications   Current Outpatient Rx  Name Route Sig Dispense Refill  . ACETAMINOPHEN 500 MG PO TABS Oral Take 1,000 mg by mouth every 6 (six) hours as needed. For pain    . LISINOPRIL 20 MG PO TABS Oral Take 1 tablet (20 mg total) by mouth daily. 60 tablet 6  . TRAMADOL HCL 50 MG PO TABS Oral Take 1 tablet (50 mg total) by mouth every 6 (six) hours as needed for pain. 30 tablet 6    BP 161/89  Pulse 81  Temp 98.2 F (36.8 C)  Resp 22  SpO2  96%  Physical Exam  Constitutional: She is oriented to person, place, and time. She appears well-developed and well-nourished.  HENT:  Head: Normocephalic and atraumatic.  Eyes: Conjunctivae normal and EOM are normal. Pupils are equal, round, and reactive to light.  Neck: Normal range of motion. Neck supple.  Cardiovascular: Normal rate, regular rhythm, normal heart sounds and intact distal pulses.   Pulmonary/Chest: Effort normal and breath sounds normal. No respiratory distress. She has no rales. She exhibits no tenderness.  Musculoskeletal:       Right shoulder: She exhibits pain. She exhibits no bony tenderness, no swelling, no deformity, normal pulse and normal strength.       Left shoulder: She exhibits pain. She exhibits no bony tenderness, no swelling, no deformity, normal pulse and normal strength.       Right knee: She exhibits decreased range of motion. She exhibits no swelling, no effusion, no ecchymosis, no deformity, no erythema and normal alignment.       Left knee: She exhibits decreased range of motion. She exhibits no swelling, no effusion, no ecchymosis, no deformity, no erythema and normal alignment.       Limited ROM in upper and lower extremity secondary to pain.  Neurological: She is alert and  oriented to person, place, and time. She has normal strength. No sensory deficit.  Skin: Skin is warm and dry. No rash noted. No erythema. No pallor.    ED Course  Procedures (including critical care time)  Labs Reviewed  BASIC METABOLIC PANEL - Abnormal; Notable for the following:    Potassium 3.3 (*)     All other components within normal limits  URINALYSIS, ROUTINE W REFLEX MICROSCOPIC - Abnormal; Notable for the following:    APPearance TURBID (*)     All other components within normal limits  URINE MICROSCOPIC-ADD ON - Abnormal; Notable for the following:    Squamous Epithelial / LPF FEW (*)     All other components within normal limits  CBC   Patient states that  she needs further pain control. She states that the tramadol doesn't work. The patient states that she has not heard from [facility]. I got the case worker to talk with the patient and she feels comfortable with this plan.   MDM  MDM Reviewed: previous chart, nursing note and vitals Interpretation: labs           Carlyle Dolly, PA-C 05/15/12 1414

## 2012-05-15 NOTE — ED Notes (Addendum)
Here last Monday for severe arthritis in legs; tramadol not helping. Given steroids on previous visit.

## 2012-05-21 NOTE — ED Provider Notes (Signed)
Medical screening examination/treatment/procedure(s) were performed by non-physician practitioner and as supervising physician I was immediately available for consultation/collaboration.  Hulan Szumski, MD 05/21/12 1511 

## 2012-05-25 ENCOUNTER — Emergency Department (HOSPITAL_COMMUNITY)
Admission: EM | Admit: 2012-05-25 | Discharge: 2012-05-25 | Disposition: A | Discharge status: Home / Self Care | Payer: No Typology Code available for payment source | Attending: Emergency Medicine | Admitting: Emergency Medicine

## 2012-05-25 ENCOUNTER — Encounter (HOSPITAL_COMMUNITY): Payer: Self-pay | Admitting: Cardiology

## 2012-05-25 DIAGNOSIS — M19019 Primary osteoarthritis, unspecified shoulder: Secondary | ICD-10-CM | POA: Insufficient documentation

## 2012-05-25 DIAGNOSIS — M2550 Pain in unspecified joint: Secondary | ICD-10-CM

## 2012-05-25 DIAGNOSIS — I10 Essential (primary) hypertension: Secondary | ICD-10-CM | POA: Insufficient documentation

## 2012-05-25 DIAGNOSIS — E119 Type 2 diabetes mellitus without complications: Secondary | ICD-10-CM | POA: Insufficient documentation

## 2012-05-25 DIAGNOSIS — Z8739 Personal history of other diseases of the musculoskeletal system and connective tissue: Secondary | ICD-10-CM

## 2012-05-25 DIAGNOSIS — Z79899 Other long term (current) drug therapy: Secondary | ICD-10-CM | POA: Insufficient documentation

## 2012-05-25 MED ORDER — IBUPROFEN 400 MG PO TABS
600.0000 mg | ORAL_TABLET | Freq: Once | ORAL | Status: AC
  Administered 2012-05-25: 600 mg via ORAL
  Filled 2012-05-25: qty 1

## 2012-05-25 MED ORDER — PREDNISONE 20 MG PO TABS
40.0000 mg | ORAL_TABLET | Freq: Once | ORAL | Status: AC
  Administered 2012-05-25: 40 mg via ORAL
  Filled 2012-05-25: qty 2

## 2012-05-25 MED ORDER — NAPROXEN 375 MG PO TABS: 375.0000 mg | ORAL_TABLET | Freq: Two times a day (BID) | ORAL | Status: DC | PRN

## 2012-05-25 MED ORDER — HYDROMORPHONE HCL PF 1 MG/ML IJ SOLN
1.0000 mg | Freq: Once | INTRAMUSCULAR | Status: AC
  Administered 2012-05-25: 1 mg via INTRAMUSCULAR
  Filled 2012-05-25: qty 1

## 2012-05-25 MED ORDER — OXYCODONE-ACETAMINOPHEN 5-325 MG PO TABS: 1.0000 | ORAL_TABLET | ORAL | Status: DC | PRN

## 2012-05-25 MED ORDER — PREDNISONE 20 MG PO TABS: 40.0000 mg | ORAL_TABLET | Freq: Every day | ORAL | Status: DC

## 2012-05-25 NOTE — ED Provider Notes (Signed)
History  This chart was scribed for Raeford Razor, MD by Shari Heritage. The patient was seen in room TR06C/TR06C. Patient's care was started at 1629.     CSN: [id number]  Arrival date & time 05/25/12  1346   First MD Initiated Contact with Patient 05/25/12 1604      Chief Complaint  Patient presents with  . pain     The history is provided by the patient. No language interpreter was used.    [name] is a 76 y.o. female who presents to the Emergency Department complaining of generalized, moderate, constant arthritic pain that is worse in the shoulders and knees onset a few days ago when patient ran out of her pain mediation. 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 She has an appointment with a primary care physician on November 14th. Patient has been taking medication for the past 5 years and her last prescription was for Tramadol. Patient has had steroid shots before, but is not taking steroids regularly. Patient has never seen a rheumatologist. Patient has a history of HTN, diabetes and back surgery.   Past Medical History  Diagnosis Date  . Arthritis   . Hypertension   . Diabetes mellitus     Past Surgical History  Procedure Date  . Back surgery   . Abdominal hysterectomy     History reviewed. No pertinent family history.  History  Substance Use Topics  . Smoking status: Never Smoker   . Smokeless tobacco: Not on file  . Alcohol Use: No    OB History    Grav Para Term Preterm Abortions TAB SAB Ect Mult Living                  Review of Systems  Constitutional: Negative for fever and chills.  Musculoskeletal: Positive for arthralgias.  Skin: Negative for rash.  All other systems reviewed and are negative.    Allergies  Review of patient's allergies indicates no known allergies.  Home Medications   Current Outpatient Rx  Name Route Sig Dispense Refill  . HYDROCODONE-ACETAMINOPHEN 5-325  MG PO TABS Oral Take 1 tablet by mouth every 8 (eight) hours as needed for pain. 15 tablet 0  . LISINOPRIL 20 MG PO TABS Oral Take 1 tablet (20 mg total) by mouth daily. 60 tablet 6  . TRAMADOL HCL 50 MG PO TABS Oral Take 1 tablet (50 mg total) by mouth every 6 (six) hours as needed for pain. 30 tablet 6    BP 146/90  Pulse 95  Temp 98.1 F (36.7 C) (Oral)  Resp 18  SpO2 98%  Physical Exam  Nursing note and vitals reviewed. Constitutional: She appears well-developed and well-nourished. No distress.  HENT:  Head: Normocephalic and atraumatic.  Eyes: Conjunctivae normal are normal. Right eye exhibits no discharge. Left eye exhibits no discharge.  Neck: Neck supple.  Cardiovascular: Normal rate, regular rhythm and normal heart sounds.  Exam reveals no gallop and no friction rub.   No murmur heard. Pulmonary/Chest: Effort normal and breath sounds normal. No respiratory distress.  Abdominal: Soft. She exhibits no distension. There is no tenderness.  Musculoskeletal: She exhibits no edema and no tenderness.       Mild tenderness across bilateral trapezius muscles. Pain reproducible with abduction of both shoulders. ROM of all other large joints without significant pain. No effusion appreciated. No concerning skin changes.  Neurological: She is alert.  Skin: Skin  is warm and dry.  Psychiatric: She has a normal mood and affect. Her behavior is normal. Thought content normal.      ED Course  Procedures (including critical care time) DIAGNOSTIC STUDIES: Oxygen Saturation is 98% on room air, normal by my interpretation.    COORDINATION OF CARE: 4:28pm- Patient informed of current plan for treatment and evaluation and agrees with plan at this time.      Labs Reviewed - No data to display No results found.   1. History of chronic arthritis   2. Arthralgia       MDM  76yF with arthralgia. Chronic in nature. No acute change in symptoms. Afebrile. Very low suspicion for septic  joint or other emergent etiology. Pt has several recent visits for same. 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 Encouraged to follow-up and discuss possible rheum or pain management referral.      I personally preformed the services scribed in my presence. The recorded information has been reviewed and considered. Raeford Razor, MD.    Raeford Razor, MD 05/25/12 [phone]

## 2012-05-25 NOTE — ED Notes (Signed)
Pt reports a hx of arthritis and states she is having pain in her legs and back. Reports she feels like they are on fire. Reports she is out of her pain medication since Sunday. Reports this pain feels like her normal arthritis pain. Denies any chest pain or SOB.

## 2012-07-12 ENCOUNTER — Emergency Department (HOSPITAL_COMMUNITY)
Admission: EM | Admit: 2012-07-12 | Discharge: 2012-07-12 | Disposition: A | Discharge status: Home / Self Care | Payer: No Typology Code available for payment source | Attending: Emergency Medicine | Admitting: Emergency Medicine

## 2012-07-12 ENCOUNTER — Emergency Department (HOSPITAL_COMMUNITY): Payer: No Typology Code available for payment source

## 2012-07-12 ENCOUNTER — Encounter (HOSPITAL_COMMUNITY): Payer: Self-pay | Admitting: Emergency Medicine

## 2012-07-12 DIAGNOSIS — M129 Arthropathy, unspecified: Secondary | ICD-10-CM | POA: Insufficient documentation

## 2012-07-12 DIAGNOSIS — R51 Headache: Secondary | ICD-10-CM | POA: Insufficient documentation

## 2012-07-12 DIAGNOSIS — Z9889 Other specified postprocedural states: Secondary | ICD-10-CM | POA: Insufficient documentation

## 2012-07-12 DIAGNOSIS — M1990 Unspecified osteoarthritis, unspecified site: Secondary | ICD-10-CM

## 2012-07-12 DIAGNOSIS — I10 Essential (primary) hypertension: Secondary | ICD-10-CM | POA: Insufficient documentation

## 2012-07-12 DIAGNOSIS — Z79899 Other long term (current) drug therapy: Secondary | ICD-10-CM | POA: Insufficient documentation

## 2012-07-12 DIAGNOSIS — M2550 Pain in unspecified joint: Secondary | ICD-10-CM

## 2012-07-12 DIAGNOSIS — R079 Chest pain, unspecified: Secondary | ICD-10-CM | POA: Insufficient documentation

## 2012-07-12 DIAGNOSIS — E119 Type 2 diabetes mellitus without complications: Secondary | ICD-10-CM | POA: Insufficient documentation

## 2012-07-12 DIAGNOSIS — Z760 Encounter for issue of repeat prescription: Secondary | ICD-10-CM | POA: Insufficient documentation

## 2012-07-12 DIAGNOSIS — G8929 Other chronic pain: Secondary | ICD-10-CM | POA: Insufficient documentation

## 2012-07-12 LAB — CBC
MCV: 87.6 fL (ref 78.0–100.0)
Platelets: 343 10*3/uL (ref 150–400)
RBC: 5.01 MIL/uL (ref 3.87–5.11)
RDW: 13.3 % (ref 11.5–15.5)
WBC: 9 10*3/uL (ref 4.0–10.5)

## 2012-07-12 LAB — BASIC METABOLIC PANEL
CO2: 29 mEq/L (ref 19–32)
Calcium: 10 mg/dL (ref 8.4–10.5)
GFR calc non Af Amer: >90 mL/min (ref >90)
Glucose, Bld: 114 mg/dL — ABNORMAL HIGH (ref 70–99)
Potassium: 3.4 mEq/L — ABNORMAL LOW (ref 3.5–5.1)
Sodium: 140 mEq/L (ref 135–145)

## 2012-07-12 LAB — TROPONIN I: Troponin I: <0.3 ng/mL (ref <0.30)

## 2012-07-12 MED ORDER — TRAMADOL HCL 50 MG PO TABS: 50.0000 mg | ORAL_TABLET | Freq: Four times a day (QID) | ORAL | Status: DC | PRN

## 2012-07-12 MED ORDER — HYDROCODONE-ACETAMINOPHEN 5-325 MG PO TABS
1.0000 | ORAL_TABLET | Freq: Once | ORAL | Status: AC
  Administered 2012-07-12: 1 via ORAL
  Filled 2012-07-12: qty 1

## 2012-07-12 MED ORDER — POTASSIUM CHLORIDE CRYS ER 20 MEQ PO TBCR
20.0000 meq | EXTENDED_RELEASE_TABLET | Freq: Once | ORAL | Status: AC
  Administered 2012-07-12: 20 meq via ORAL
  Filled 2012-07-12: qty 1

## 2012-07-12 MED ORDER — NAPROXEN 375 MG PO TABS: 375.0000 mg | ORAL_TABLET | Freq: Two times a day (BID) | ORAL | Status: DC

## 2012-07-12 NOTE — ED Notes (Signed)
Pt here requesting refill on naproxen and tramadol; pt sts having chronic pain since running out

## 2012-07-12 NOTE — ED Provider Notes (Signed)
History   This chart was scribed for Vicki Roots, MD by Melba Coon, ED Scribe. The patient was seen in room TR05C/TR05C and the patient's care was started at 2:45PM.    CSN: [id number]  Arrival date & time 07/12/12  1337   None     Chief complaint: pain all over.   (Consider location/radiation/quality/duration/timing/severity/associated sxs/prior treatment) The history is provided by the patient. No language interpreter was used.   [name] is a 76 y.o. female who presents to the Emergency Department c/o pain all over, also requesting a medication refill for naproxen and tramadol for her chronic arthritic neck and knee pain. She reports running out her medication about a week ago. She is a former Information systems manager patient. She reports calling her new PCP for a pain med refill about 4 days ago but was not able to get it filled. Changing body position, bending her knee, and twisting her neck aggravates the pain. She reports HA and chest pain. She denies fever, sore throat, rash, back pain, SOB, abdominal pain, nausea, emesis, diarrhea, dysuria, or extremity edema, weakness, numbness, or tingling. No known allergies. No other pertinent medical symptoms.   Past Medical History  Diagnosis Date  . Arthritis   . Hypertension   . Diabetes mellitus     Past Surgical History  Procedure Date  . Back surgery   . Abdominal hysterectomy     History reviewed. No pertinent family history.  History  Substance Use Topics  . Smoking status: Never Smoker   . Smokeless tobacco: Not on file  . Alcohol Use: No    OB History    Grav Para Term Preterm Abortions TAB SAB Ect Mult Living                  Review of Systems 10 Systems reviewed and all are negative for acute change except as noted in the HPI.   Allergies  Review of patient's allergies indicates no known allergies.  Home Medications   Current Outpatient Rx  Name  Route  Sig  Dispense  Refill  . BC HEADACHE POWDER  PO   Oral   Take 1 packet by mouth daily as needed. For pain         . LISINOPRIL 20 MG PO TABS   Oral   Take 1 tablet (20 mg total) by mouth daily.   60 tablet   6   . NAPROXEN 375 MG PO TABS   Oral   Take 375 mg by mouth 2 (two) times daily as needed.         Marland Kitchen TRAMADOL HCL 50 MG PO TABS   Oral   Take 50 mg by mouth every 6 (six) hours as needed. For pai           BP 155/108  Pulse 111  Temp 98.3 F (36.8 C) (Oral)  Resp 20  SpO2 97%  Physical Exam  Nursing note and vitals reviewed. Constitutional: She is oriented to person, place, and time.       Awake, alert, nontoxic appearance.  HENT:  Head: Atraumatic.  Nose: Nose normal.  Mouth/Throat: Oropharynx is clear and moist.  Eyes: Conjunctivae normal are normal. Right eye exhibits no discharge. Left eye exhibits no discharge.  Neck: Normal range of motion. Neck supple.  Cardiovascular: Normal rate, regular rhythm, normal heart sounds and intact distal pulses.  Exam reveals no gallop and no friction rub.   No murmur heard. Pulmonary/Chest: Effort normal and breath  sounds normal. She exhibits no tenderness.  Abdominal: Soft. Bowel sounds are normal. There is no tenderness. There is no rebound.  Genitourinary:       No cva tenderness  Musculoskeletal: She exhibits no edema and no tenderness.       Generalized arthralgias. Baseline ROM, no obvious new focal weakness.  Neurological: She is alert and oriented to person, place, and time.       Mental status and motor strength appears baseline for patient and situation.  Skin: Skin is warm and dry. No rash noted.  Psychiatric: She has a normal mood and affect.    ED Course  Procedures (including critical care time)  COORDINATION OF CARE:  2:47PM - naproxen and tramadol will be Rx and ordered for [name]. 3:04PM recheck; HR: 115. EKG, CXR and blood w/u will be ordered for Mrs [name]. 3:55PM - imaging results reviewed and are unremarkable.  *RADIOLOGY  REPORT*  Clinical Data: Body aches  CHEST - 2 VIEW  Comparison: None.  Findings: Normal cardiac silhouette. Lungs are clear. There is kyphoplasty of the lower thoracic spine unchanged. Cholecystectomy clips noted.  IMPRESSION: No acute cardiopulmonary process.   Original Report Authenticated By: Genevive Bi, M.D.   4:05PM - lab results reviewed  Results for orders placed during the hospital encounter of 07/12/12  BASIC METABOLIC PANEL      Component Value Range   Sodium 140  135 - 145 mEq/L   Potassium 3.4 (*) 3.5 - 5.1 mEq/L   Chloride 100  96 - 112 mEq/L   CO2 29  19 - 32 mEq/L   Glucose, Bld 114 (*) 70 - 99 mg/dL   BUN 9  6 - 23 mg/dL   Creatinine, Ser 1.30  0.50 - 1.10 mg/dL   Calcium 86.5  8.4 - 78.4 mg/dL   GFR calc non Af Amer >90  >90 mL/min   GFR calc Af Amer >90  >90 mL/min  TROPONIN I      Component Value Range   Troponin I <0.30  <0.30 ng/mL  CBC      Component Value Range   WBC 9.0  4.0 - 10.5 K/uL   RBC 5.01  3.87 - 5.11 MIL/uL   Hemoglobin 14.7  12.0 - 15.0 g/dL   HCT 69.6  29.5 - 28.4 %   MCV 87.6  78.0 - 100.0 fL   MCH 29.3  26.0 - 34.0 pg   MCHC 33.5  30.0 - 36.0 g/dL   RDW 13.2  44.0 - 10.2 %   Platelets 343  150 - 400 K/uL       MDM  I personally performed the services described in this documentation, which was scribed in my presence. The recorded information has been reviewed and is accurate.   Pt states here as needs refill of meds for chronic arthritis, denies any new symptoms, recent injury or fall.   No fever or chills.   vicodin 1 po.    Date: 07/12/2012  Rate: 101  Rhythm: sinus tachycardia  QRS Axis: normal  Intervals: normal  ST/T Wave abnormalities: normal  Conduction Disutrbances:none  Narrative Interpretation:   Old EKG Reviewed: none available  During eval pt had noted along w her entire body/joint pain, that chest had also been sore/diffusely for past 2 days. Constant. Worse w movement. No pleuritic pain.  No episodic or exertion cp. No associated sob, nv, diaphoresis, leg pain or swelling.  After these chest symptoms for 2 days, constant, trop neg/normal, cxr  neg acute, ecg without acute isch changes.  Pt appears stable for d/c. Comfortable on recheck.   Vicki Roots, MD 07/12/12 [phone]

## 2012-12-10 ENCOUNTER — Emergency Department (HOSPITAL_COMMUNITY): Payer: No Typology Code available for payment source

## 2012-12-10 ENCOUNTER — Inpatient Hospital Stay (HOSPITAL_COMMUNITY)
Admission: EM | Admit: 2012-12-10 | Discharge: 2012-12-15 | DRG: 690 | Disposition: A | Discharge status: Home / Self Care | Payer: No Typology Code available for payment source | Attending: Internal Medicine | Admitting: Internal Medicine

## 2012-12-10 ENCOUNTER — Encounter (HOSPITAL_COMMUNITY): Payer: Self-pay | Admitting: Emergency Medicine

## 2012-12-10 DIAGNOSIS — R7309 Other abnormal glucose: Secondary | ICD-10-CM

## 2012-12-10 DIAGNOSIS — E860 Dehydration: Secondary | ICD-10-CM | POA: Diagnosis present

## 2012-12-10 DIAGNOSIS — IMO0002 Reserved for concepts with insufficient information to code with codable children: Secondary | ICD-10-CM

## 2012-12-10 DIAGNOSIS — Z9119 Patient's noncompliance with other medical treatment and regimen: Secondary | ICD-10-CM

## 2012-12-10 DIAGNOSIS — R000 Tachycardia, unspecified: Secondary | ICD-10-CM | POA: Diagnosis not present

## 2012-12-10 DIAGNOSIS — Z794 Long term (current) use of insulin: Secondary | ICD-10-CM

## 2012-12-10 DIAGNOSIS — I10 Essential (primary) hypertension: Secondary | ICD-10-CM | POA: Diagnosis present

## 2012-12-10 DIAGNOSIS — R739 Hyperglycemia, unspecified: Secondary | ICD-10-CM | POA: Diagnosis present

## 2012-12-10 DIAGNOSIS — R55 Syncope and collapse: Secondary | ICD-10-CM | POA: Diagnosis present

## 2012-12-10 DIAGNOSIS — M129 Arthropathy, unspecified: Secondary | ICD-10-CM | POA: Diagnosis present

## 2012-12-10 DIAGNOSIS — A4902 Methicillin resistant Staphylococcus aureus infection, unspecified site: Secondary | ICD-10-CM | POA: Diagnosis present

## 2012-12-10 DIAGNOSIS — R51 Headache: Secondary | ICD-10-CM | POA: Diagnosis not present

## 2012-12-10 DIAGNOSIS — Z7952 Long term (current) use of systemic steroids: Secondary | ICD-10-CM

## 2012-12-10 DIAGNOSIS — IMO0001 Reserved for inherently not codable concepts without codable children: Secondary | ICD-10-CM | POA: Diagnosis present

## 2012-12-10 DIAGNOSIS — M069 Rheumatoid arthritis, unspecified: Secondary | ICD-10-CM | POA: Diagnosis present

## 2012-12-10 DIAGNOSIS — Z91199 Patient's noncompliance with other medical treatment and regimen due to unspecified reason: Secondary | ICD-10-CM

## 2012-12-10 DIAGNOSIS — N390 Urinary tract infection, site not specified: Principal | ICD-10-CM | POA: Diagnosis present

## 2012-12-10 HISTORY — DX: Headache: R51

## 2012-12-10 HISTORY — DX: Type 2 diabetes mellitus without complications: E11.9

## 2012-12-10 HISTORY — DX: Unspecified chronic bronchitis: J42

## 2012-12-10 HISTORY — DX: Shortness of breath: R06.02

## 2012-12-10 HISTORY — DX: Angina pectoris, unspecified: I20.9

## 2012-12-10 LAB — COMPREHENSIVE METABOLIC PANEL
ALT: 14 U/L (ref 0–35)
Albumin: 3 g/dL — ABNORMAL LOW (ref 3.5–5.2)
Alkaline Phosphatase: 72 U/L (ref 39–117)
Potassium: 3.7 mEq/L (ref 3.5–5.1)
Sodium: 134 mEq/L — ABNORMAL LOW (ref 135–145)
Total Protein: 7.4 g/dL (ref 6.0–8.3)

## 2012-12-10 LAB — URINALYSIS, ROUTINE W REFLEX MICROSCOPIC
Hgb urine dipstick: NEGATIVE
Nitrite: NEGATIVE
Specific Gravity, Urine: 1.036 — ABNORMAL HIGH (ref 1.005–1.030)
pH: 5.5 (ref 5.0–8.0)

## 2012-12-10 LAB — CBC WITH DIFFERENTIAL/PLATELET
Basophils Absolute: 0.1 10*3/uL (ref 0.0–0.1)
Basophils Relative: 1 % (ref 0–1)
Eosinophils Absolute: 0.1 10*3/uL (ref 0.0–0.7)
MCH: 30 pg (ref 26.0–34.0)
MCHC: 34.6 g/dL (ref 30.0–36.0)
Neutrophils Relative %: 61 % (ref 43–77)
Platelets: 388 10*3/uL (ref 150–400)
RBC: 4.67 MIL/uL (ref 3.87–5.11)
RDW: 15.2 % (ref 11.5–15.5)

## 2012-12-10 LAB — URINE MICROSCOPIC-ADD ON

## 2012-12-10 MED ORDER — SODIUM CHLORIDE 0.9 % IV BOLUS (SEPSIS)
1000.0000 mL | Freq: Once | INTRAVENOUS | Status: AC
  Administered 2012-12-11: 1000 mL via INTRAVENOUS

## 2012-12-10 MED ORDER — DEXTROSE 5 % IV SOLN
1.0000 g | Freq: Once | INTRAVENOUS | Status: AC
  Administered 2012-12-11: 1 g via INTRAVENOUS
  Filled 2012-12-10: qty 10

## 2012-12-10 MED ORDER — SODIUM CHLORIDE 0.9 % IV BOLUS (SEPSIS)
1000.0000 mL | Freq: Once | INTRAVENOUS | Status: AC
  Administered 2012-12-10: 1000 mL via INTRAVENOUS

## 2012-12-10 NOTE — ED Notes (Addendum)
BS 354 in Triage

## 2012-12-10 NOTE — ED Provider Notes (Signed)
History     CSN: [id number]  Arrival date & time 12/10/12  4098   First MD Initiated Contact with Patient 12/10/12 2135      Chief Complaint  Patient presents with  . Hyperglycemia    HPI 77 year old female with a history of diabetes and hypertension presents complaining of lightheadedness, hyperglycemia, cough.  Patient reports she's had difficulty with glycemic control over the course of the past week.  She has had diabetes for many years.  She was on Metformin years ago, but has been diet controlled, off all medications until recently, when her PCP restarted Metformin.  She began having lightheadedness about 3 days ago and went to her PCP's office.  En route, she had a syncopal episode.  At his office, her blood glucose was greater than 500.  She was eventually discharged to home from his office after treatment and labs.  However, her lightheadedness and hyperglycemia have been persistent for the remainder of the week.  She was also diagnosed with a UTI 3 days ago; she was discharged home with a prescription for Cipro, which she has been taking.  However, she continues to have dysuria and does not feel her UTI is getting any better.  She has had polydipsia and polyuria.  She has had blurring of her vision.  She also has a moderately severe, non-productive cough associated with chest tightness and it "feels like I have bronchitis."  She denies headaches, neck pain, chest pain, abdominal pain, nausea, vomiting, diarrhea.   Past Medical History  Diagnosis Date  . Hypertension   . Type II diabetes mellitus   . Chronic bronchitis     "I've had it alot of times" (12/11/2012)  . Anginal pain   . Exertional shortness of breath   . Headache     "once or twice/month" (12/11/2012)  . Arthritis     "all over my body" (12/11/2012)    Past Surgical History  Procedure Laterality Date  . Back surgery    . Cholecystectomy  1990's  . Appendectomy  1969  . Lumbar disc surgery      "slipped  disc" (12/11/2012)  . Shoulder surgery Left ~ 2009    "put cement down in it" (12/11/2012)  . Vaginal hysterectomy  ~ 1973  . Ectopic pregnancy surgery  1969    History reviewed. No pertinent family history.  History  Substance Use Topics  . Smoking status: Former Smoker    Types: Cigarettes  . Smokeless tobacco: Never Used     Comment: 12/11/2012 "1 pack of cigarettes would last 3-4 months; haven't had any cigarettes for 40 years"  . Alcohol Use: No    OB History   Grav Para Term Preterm Abortions TAB SAB Ect Mult Living                  Review of Systems  Constitutional: Positive for fatigue. Negative for fever, chills and diaphoresis.  HENT: Negative for congestion, rhinorrhea, neck pain and neck stiffness.   Respiratory: Positive for cough and chest tightness. Negative for shortness of breath and wheezing.   Cardiovascular: Negative for chest pain and leg swelling.  Gastrointestinal: Negative for nausea, vomiting, abdominal pain and diarrhea.  Endocrine: Positive for polydipsia and polyuria.  Genitourinary: Positive for dysuria and frequency. Negative for urgency, flank pain, vaginal bleeding, vaginal discharge and difficulty urinating.  Skin: Negative for rash.  Neurological: Positive for light-headedness. Negative for weakness, numbness and headaches.  All other systems reviewed and are negative.  Allergies  Review of patient's allergies indicates no known allergies.  Home Medications   No current outpatient prescriptions on file.  BP 149/68  Pulse 119  Temp(Src) 100.2 F (37.9 C) (Oral)  Resp 20  Wt 133 lb 6.1 oz (60.5 kg)  SpO2 96%  Physical Exam  Nursing note and vitals reviewed. Constitutional: She is oriented to person, place, and time. She appears well-developed and well-nourished. No distress.  HENT:  Head: Normocephalic and atraumatic.  Mouth/Throat: Oropharynx is clear and moist.  Eyes: Conjunctivae and EOM are normal. Pupils are equal, round,  and reactive to light. No scleral icterus.  Neck: Normal range of motion. Neck supple. No JVD present.  Cardiovascular: Regular rhythm, normal heart sounds and intact distal pulses.  Tachycardia present.  Exam reveals no gallop and no friction rub.   No murmur heard. Pulmonary/Chest: Effort normal and breath sounds normal. No respiratory distress. She has no wheezes. She has no rales.  Abdominal: Soft. Bowel sounds are normal. She exhibits no distension. There is no tenderness. There is no rebound and no guarding.  Musculoskeletal: She exhibits no edema.  Neurological: She is alert and oriented to person, place, and time. No cranial nerve deficit. She exhibits normal muscle tone. Coordination normal.  Skin: Skin is warm and dry. She is not diaphoretic.    ED Course  Procedures (including critical care time)  Labs Reviewed  CBC WITH DIFFERENTIAL - Abnormal; Notable for the following:    WBC 12.8 (*)    Neutro Abs 7.9 (*)    All other components within normal limits  COMPREHENSIVE METABOLIC PANEL - Abnormal; Notable for the following:    Sodium 134 (*)    Chloride 93 (*)    Glucose, Bld 341 (*)    Albumin 3.0 (*)    GFR calc non Af Amer 83 (*)    All other components within normal limits  GLUCOSE, CAPILLARY - Abnormal; Notable for the following:    Glucose-Capillary 354 (*)    All other components within normal limits  URINALYSIS, ROUTINE W REFLEX MICROSCOPIC - Abnormal; Notable for the following:    APPearance CLOUDY (*)    Specific Gravity, Urine 1.036 (*)    Glucose, UA >1000 (*)    Bilirubin Urine SMALL (*)    Ketones, ur >80 (*)    Protein, ur 30 (*)    Leukocytes, UA MODERATE (*)    All other components within normal limits  URINE MICROSCOPIC-ADD ON - Abnormal; Notable for the following:    Squamous Epithelial / LPF FEW (*)    Bacteria, UA MANY (*)    All other components within normal limits  URINE CULTURE   Dg Chest 2 View  12/10/2012   *RADIOLOGY REPORT*  Clinical  Data: Shortness of breath, hyperglycemia.  CHEST - 2 VIEW  Comparison: 07/12/2012  Findings: Heart is upper limits normal in size. Left lung is clear. No effusions.  No acute bony abnormality.  Prior to level vertebroplasty in the lower thoracic spine.  Linear subsegmental atelectasis in the right lung base.  IMPRESSION: Right base atelectasis.   Original Report Authenticated By: Charlett Nose, M.D.     1. Hyperglycemia   2. UTI (lower urinary tract infection)   3. Dehydration       MDM  77 yo F presents c/o hyperglycemia, lightheadedness, syncopal episode 3 days ago, dysuria, fatigue.  Poor glycemic control, likely in part due to UTI not-responding to treatment.  Cough, chest tightness.  Tachycardia to 123, low  grade fever to 100.3; hypertensive to 150s; otherwise normal vitals.  Non-toxic appearing.  Chest CTAB.  Remainder of exam unremarkable.  UA demonstrates UTI, with ketones; not acidotic; leukocytosis  Impression is dehydration secondary to hyperglycemia (not DKA), secondary in part to UTI, unresponsive to outpatient therapy  Admitted to hospitalist for further mgt.  Gave a dose of Rocephin in ED.  Sent urine culture.  Tachycardia did not improve significantly after 1 L IVF bolus.         Toney Sang, MD 12/11/12 1113

## 2012-12-10 NOTE — ED Notes (Addendum)
PT. REPORTS ELEVATED BLOOD SUGAR FOR THE PAST SEVERAL DAYS WITH DIZZINESS AND BLURRED VISION . ALSO REPORTS OCCASIONAL DRY COUGH / CHEST CONGESTION .

## 2012-12-11 ENCOUNTER — Encounter (HOSPITAL_COMMUNITY): Payer: Self-pay | Admitting: Internal Medicine

## 2012-12-11 DIAGNOSIS — Z91199 Patient's noncompliance with other medical treatment and regimen due to unspecified reason: Secondary | ICD-10-CM

## 2012-12-11 DIAGNOSIS — Z9119 Patient's noncompliance with other medical treatment and regimen: Secondary | ICD-10-CM

## 2012-12-11 DIAGNOSIS — N390 Urinary tract infection, site not specified: Secondary | ICD-10-CM

## 2012-12-11 DIAGNOSIS — Z7952 Long term (current) use of systemic steroids: Secondary | ICD-10-CM

## 2012-12-11 DIAGNOSIS — M069 Rheumatoid arthritis, unspecified: Secondary | ICD-10-CM

## 2012-12-11 DIAGNOSIS — E860 Dehydration: Secondary | ICD-10-CM | POA: Diagnosis present

## 2012-12-11 DIAGNOSIS — R55 Syncope and collapse: Secondary | ICD-10-CM

## 2012-12-11 DIAGNOSIS — R739 Hyperglycemia, unspecified: Secondary | ICD-10-CM | POA: Diagnosis present

## 2012-12-11 DIAGNOSIS — IMO0002 Reserved for concepts with insufficient information to code with codable children: Secondary | ICD-10-CM

## 2012-12-11 DIAGNOSIS — I517 Cardiomegaly: Secondary | ICD-10-CM

## 2012-12-11 HISTORY — DX: Patient's noncompliance with other medical treatment and regimen due to unspecified reason: Z91.199

## 2012-12-11 HISTORY — DX: Patient's noncompliance with other medical treatment and regimen: Z91.19

## 2012-12-11 HISTORY — DX: Syncope and collapse: R55

## 2012-12-11 HISTORY — DX: Reserved for concepts with insufficient information to code with codable children: IMO0002

## 2012-12-11 LAB — CBC
HCT: 34.9 % — ABNORMAL LOW (ref 36.0–46.0)
Hemoglobin: 11.6 g/dL — ABNORMAL LOW (ref 12.0–15.0)
MCHC: 33.2 g/dL (ref 30.0–36.0)
RDW: 15.3 % (ref 11.5–15.5)
WBC: 12.2 10*3/uL — ABNORMAL HIGH (ref 4.0–10.5)

## 2012-12-11 LAB — CREATININE, SERUM
Creatinine, Ser: 0.54 mg/dL (ref 0.50–1.10)
GFR calc Af Amer: >90 mL/min (ref >90)
GFR calc non Af Amer: >90 mL/min (ref >90)

## 2012-12-11 LAB — HEMOGLOBIN A1C
Hgb A1c MFr Bld: 11.9 % — ABNORMAL HIGH (ref <5.7)
Mean Plasma Glucose: 295 mg/dL — ABNORMAL HIGH (ref <117)

## 2012-12-11 LAB — GLUCOSE, CAPILLARY: Glucose-Capillary: 251 mg/dL — ABNORMAL HIGH (ref 70–99)

## 2012-12-11 LAB — TROPONIN I: Troponin I: <0.3 ng/mL (ref <0.30)

## 2012-12-11 LAB — POCT I-STAT TROPONIN I

## 2012-12-11 MED ORDER — ENOXAPARIN SODIUM 40 MG/0.4ML ~~LOC~~ SOLN
40.0000 mg | SUBCUTANEOUS | Status: DC
  Administered 2012-12-11 – 2012-12-15 (×5): 40 mg via SUBCUTANEOUS
  Filled 2012-12-11 (×5): qty 0.4

## 2012-12-11 MED ORDER — ONDANSETRON HCL 4 MG PO TABS
4.0000 mg | ORAL_TABLET | Freq: Four times a day (QID) | ORAL | Status: DC | PRN
  Administered 2012-12-14: 4 mg via ORAL
  Filled 2012-12-11: qty 1

## 2012-12-11 MED ORDER — ASPIRIN EC 81 MG PO TBEC
81.0000 mg | DELAYED_RELEASE_TABLET | Freq: Every day | ORAL | Status: DC
  Administered 2012-12-11 – 2012-12-15 (×5): 81 mg via ORAL
  Filled 2012-12-11 (×5): qty 1

## 2012-12-11 MED ORDER — BOOST / RESOURCE BREEZE PO LIQD
1.0000 | Freq: Every day | ORAL | Status: DC
  Administered 2012-12-11 – 2012-12-15 (×5): 1 via ORAL

## 2012-12-11 MED ORDER — METFORMIN HCL 500 MG PO TABS
500.0000 mg | ORAL_TABLET | Freq: Two times a day (BID) | ORAL | Status: DC
  Administered 2012-12-11 – 2012-12-12 (×3): 500 mg via ORAL
  Filled 2012-12-11 (×5): qty 1

## 2012-12-11 MED ORDER — LISINOPRIL 20 MG PO TABS
20.0000 mg | ORAL_TABLET | Freq: Two times a day (BID) | ORAL | Status: DC
  Administered 2012-12-11 – 2012-12-15 (×10): 20 mg via ORAL
  Filled 2012-12-11 (×11): qty 1

## 2012-12-11 MED ORDER — SODIUM CHLORIDE 0.9 % IV BOLUS (SEPSIS)
500.0000 mL | Freq: Once | INTRAVENOUS | Status: AC
  Administered 2012-12-11: 500 mL via INTRAVENOUS

## 2012-12-11 MED ORDER — SODIUM CHLORIDE 0.9 % IJ SOLN
3.0000 mL | Freq: Two times a day (BID) | INTRAMUSCULAR | Status: DC
  Administered 2012-12-11 – 2012-12-15 (×6): 3 mL via INTRAVENOUS

## 2012-12-11 MED ORDER — ONDANSETRON HCL 4 MG/2ML IJ SOLN: 4.0000 mg | Freq: Three times a day (TID) | INTRAMUSCULAR | Status: DC | PRN

## 2012-12-11 MED ORDER — PNEUMOCOCCAL VAC POLYVALENT 25 MCG/0.5ML IJ INJ
0.5000 mL | INJECTION | Freq: Once | INTRAMUSCULAR | Status: AC
  Administered 2012-12-11: 0.5 mL via INTRAMUSCULAR
  Filled 2012-12-11: qty 0.5

## 2012-12-11 MED ORDER — SODIUM CHLORIDE 0.9 % IV SOLN
INTRAVENOUS | Status: DC
  Administered 2012-12-11: 02:00:00 via INTRAVENOUS
  Administered 2012-12-11: 1000 mL via INTRAVENOUS
  Administered 2012-12-12: 15:00:00 via INTRAVENOUS
  Administered 2012-12-12: 100 mL/h via INTRAVENOUS
  Administered 2012-12-13: 01:00:00 via INTRAVENOUS

## 2012-12-11 MED ORDER — INSULIN ASPART 100 UNIT/ML ~~LOC~~ SOLN
0.0000 [IU] | Freq: Three times a day (TID) | SUBCUTANEOUS | Status: DC
  Administered 2012-12-11: 15 [IU] via SUBCUTANEOUS
  Administered 2012-12-11: 11 [IU] via SUBCUTANEOUS
  Administered 2012-12-11 – 2012-12-12 (×3): 7 [IU] via SUBCUTANEOUS
  Administered 2012-12-12: 11 [IU] via SUBCUTANEOUS
  Administered 2012-12-13 (×2): 7 [IU] via SUBCUTANEOUS
  Administered 2012-12-13: 3 [IU] via SUBCUTANEOUS
  Administered 2012-12-14: 4 [IU] via SUBCUTANEOUS
  Administered 2012-12-15 (×2): 3 [IU] via SUBCUTANEOUS

## 2012-12-11 MED ORDER — DEXTROSE 5 % IV SOLN
1.0000 g | INTRAVENOUS | Status: DC
  Administered 2012-12-11 – 2012-12-12 (×2): 1 g via INTRAVENOUS
  Filled 2012-12-11 (×2): qty 10

## 2012-12-11 MED ORDER — DOCUSATE SODIUM 100 MG PO CAPS
100.0000 mg | ORAL_CAPSULE | Freq: Two times a day (BID) | ORAL | Status: DC
  Administered 2012-12-11 – 2012-12-15 (×8): 100 mg via ORAL
  Filled 2012-12-11 (×10): qty 1

## 2012-12-11 MED ORDER — FOLIC ACID 1 MG PO TABS
1.0000 mg | ORAL_TABLET | Freq: Every day | ORAL | Status: DC
  Administered 2012-12-11 – 2012-12-15 (×5): 1 mg via ORAL
  Filled 2012-12-11 (×5): qty 1

## 2012-12-11 MED ORDER — ALBUTEROL SULFATE HFA 108 (90 BASE) MCG/ACT IN AERS
2.0000 | INHALATION_SPRAY | Freq: Four times a day (QID) | RESPIRATORY_TRACT | Status: DC | PRN
  Filled 2012-12-11: qty 6.7

## 2012-12-11 MED ORDER — FLUCONAZOLE 150 MG PO TABS
150.0000 mg | ORAL_TABLET | Freq: Once | ORAL | Status: AC
  Administered 2012-12-11: 150 mg via ORAL
  Filled 2012-12-11: qty 1

## 2012-12-11 MED ORDER — ONDANSETRON HCL 4 MG/2ML IJ SOLN
4.0000 mg | Freq: Four times a day (QID) | INTRAMUSCULAR | Status: DC | PRN
  Administered 2012-12-13 – 2012-12-15 (×3): 4 mg via INTRAVENOUS
  Filled 2012-12-11 (×3): qty 2

## 2012-12-11 MED ORDER — METHYLPREDNISOLONE 16 MG PO TABS
16.0000 mg | ORAL_TABLET | Freq: Every day | ORAL | Status: DC
  Administered 2012-12-11 – 2012-12-13 (×3): 16 mg via ORAL
  Filled 2012-12-11 (×4): qty 1

## 2012-12-11 MED ORDER — TRAMADOL HCL 50 MG PO TABS
50.0000 mg | ORAL_TABLET | Freq: Two times a day (BID) | ORAL | Status: DC | PRN
  Administered 2012-12-11 – 2012-12-12 (×2): 50 mg via ORAL
  Filled 2012-12-11 (×2): qty 1

## 2012-12-11 MED ORDER — INSULIN ASPART 100 UNIT/ML ~~LOC~~ SOLN
0.0000 [IU] | Freq: Every day | SUBCUTANEOUS | Status: DC
  Administered 2012-12-11: 2 [IU] via SUBCUTANEOUS
  Administered 2012-12-11: 3 [IU] via SUBCUTANEOUS
  Administered 2012-12-12: 2 [IU] via SUBCUTANEOUS

## 2012-12-11 MED ORDER — SODIUM CHLORIDE 0.9 % IV SOLN
INTRAVENOUS | Status: DC
  Administered 2012-12-11: 02:00:00 via INTRAVENOUS

## 2012-12-11 NOTE — H&P (Signed)
[facility] History and Physical  [name] WUJ:[id number] DOB: 26-Aug-1935    PCP:   Dorrene German, MD   Chief Complaint: weakness, polyuria, and syncope.  HPI: [name] is an 77 y.o. female with hx of long standing AODM on oral hyperglycemic agent, HTN on Lisinopril, RA on chronic steroid being tapered, hx of dietary noncompliance, presents to the ER as her CBS has been elevated to 500.  She saw her PCP, and was also found to have a UTI, treated with Cipro.  While on her way to the PCP, she had a syncope in the taxi.  Her PCP had recommended her to be admitted, but she declined.  Plan at that time was to see her back in one day, tx with Cipro, and if she is not better, to admit her.  In the ER, she was found to have BS of 350, nonketotic, normal renal fx tests, and UA with TNTC WBC.  She was given IVF boluses, and hospitalist was asked to admit her for UTI failed outpatient Tx, dehydration, syncope, and hyperglycemia with chronic steroid use.  Rewiew of Systems:  Constitutional: No significant weight loss or weight gain Eyes: Negative for eye pain, redness and discharge, diplopia, visual changes, or flashes of light. ENMT: Negative for ear pain, hoarseness, nasal congestion, sinus pressure and sore throat. No headaches; tinnitus, drooling, or problem swallowing. Cardiovascular: Negative for chest pain, palpitations, diaphoresis, dyspnea and peripheral edema. ; No orthopnea, PND Respiratory: Negative for cough, hemoptysis, wheezing and stridor. No pleuritic chestpain. Gastrointestinal: Negative for  diarrhea, constipation, abdominal pain, melena, blood in stool, hematemesis, jaundice and rectal bleeding.    Genitourinary: Negative for flank pain and hematuria; Musculoskeletal: Negative for back pain and neck pain. Negative for swelling and trauma.;  Skin: . Negative for pruritus, rash, abrasions, bruising and skin lesion.; ulcerations Neuro: Negative for headache,  lightheadedness and neck stiffness. Negative for weakness, altered level of consciousness , altered mental status, extremity weakness, burning feet, involuntary movement, seizure and syncope.  Psych: negative for anxiety, depression, insomnia, tearfulness, panic attacks, hallucinations, paranoia, suicidal or homicidal ideation    Past Medical History  Diagnosis Date  . Arthritis   . Hypertension   . Diabetes mellitus     Past Surgical History  Procedure Laterality Date  . Back surgery    . Abdominal hysterectomy      Medications:  HOME MEDS: Prior to Admission medications   Medication Sig Start Date End Date Taking? Authorizing Provider  albuterol (PROVENTIL HFA;VENTOLIN HFA) 108 (90 BASE) MCG/ACT inhaler Inhale 2 puffs into the lungs every 6 (six) hours as needed for wheezing or shortness of breath.   Yes Historical Provider, MD  Aspirin-Salicylamide-Caffeine (BC HEADACHE POWDER PO) Take 1 packet by mouth daily as needed (for pain).    Yes Historical Provider, MD  ciprofloxacin (CIPRO) 500 MG tablet Take 250 mg by mouth every 12 (twelve) hours. #7 filled 12/08/12   Yes Historical Provider, MD  folic acid (FOLVITE) 1 MG tablet Take 1 mg by mouth daily.   Yes Historical Provider, MD  lisinopril (PRINIVIL,ZESTRIL) 20 MG tablet Take 1 tablet (20 mg total) by mouth daily. 05/04/12  Yes Cheri Guppy, MD  metFORMIN (GLUCOPHAGE) 500 MG tablet Take 500 mg by mouth daily.   Yes Historical Provider, MD  methylPREDNISolone (MEDROL) 4 MG tablet Take 16 mg by mouth daily.   Yes Historical Provider, MD  promethazine (PHENERGAN) 12.5 MG tablet Take 12.5 mg by mouth daily as needed for nausea.  Yes Historical Provider, MD  traMADol (ULTRAM) 50 MG tablet Take 50 mg by mouth 2 (two) times daily as needed for pain.  05/04/12  Yes Cheri Guppy, MD     Allergies:  No Known Allergies  Social History:   reports that she has never smoked. She does not have any smokeless tobacco history on file.  She reports that she does not drink alcohol or use illicit drugs.  Family History: No family history on file.   Physical Exam: Filed Vitals:   12/10/12 2332 12/10/12 2334 12/10/12 2345 12/11/12 0015  BP: 166/79 168/85 180/76 157/73  Pulse: 122 124 109 113  Temp:      TempSrc:      Resp:  29 32 23  SpO2:  93% 97% 94%   Blood pressure 157/73, pulse 113, temperature 100.2 F (37.9 C), temperature source Oral, resp. rate 23, SpO2 94.00%.  GEN:  Pleasant  patient lying in the stretcher in no acute distress; cooperative with exam. PSYCH:  alert and oriented x4; does not appear anxious or depressed; affect is appropriate. HEENT: Mucous membranes pink and anicteric; PERRLA; EOM intact; no cervical lymphadenopathy nor thyromegaly or carotid bruit; no JVD; There were no stridor. Neck is very supple. Breasts:: Not examined CHEST WALL: No tenderness CHEST: Normal respiration, clear to auscultation bilaterally.  HEART: Regular rate and rhythm.  There are no murmur, rub, or gallops.   BACK: No kyphosis or scoliosis; no CVA tenderness ABDOMEN: soft and non-tender; no masses, no organomegaly, normal abdominal bowel sounds; no pannus; no intertriginous candida. There is no rebound and no distention. Rectal Exam: Not done EXTREMITIES: No bone or joint deformity; age-appropriate arthropathy of the hands and knees; no edema; no ulcerations.  There is no calf tenderness. Genitalia: not examined PULSES: 2+ and symmetric SKIN: Normal hydration no rash or ulceration CNS: Cranial nerves 2-12 grossly intact no focal lateralizing neurologic deficit.  Speech is fluent; uvula elevated with phonation, facial symmetry and tongue midline. DTR are normal bilaterally, cerebella exam is intact, barbinski is negative and strengths are equaled bilaterally.  No sensory loss.   Labs on Admission:  Basic Metabolic Panel:  Recent Labs Lab 12/10/12 2001  NA 134*  K 3.7  CL 93*  CO2 26  GLUCOSE 341*  BUN 10   CREATININE 0.70  CALCIUM 9.5   Liver Function Tests:  Recent Labs Lab 12/10/12 2001  AST 15  ALT 14  ALKPHOS 72  BILITOT 0.4  PROT 7.4  ALBUMIN 3.0*   No results found for this basename: LIPASE, AMYLASE,  in the last 168 hours No results found for this basename: AMMONIA,  in the last 168 hours CBC:  Recent Labs Lab 12/10/12 2001  WBC 12.8*  NEUTROABS 7.9*  HGB 14.0  HCT 40.5  MCV 86.7  PLT 388   Cardiac Enzymes: No results found for this basename: CKTOTAL, CKMB, CKMBINDEX, TROPONINI,  in the last 168 hours  CBG:  Recent Labs Lab 12/10/12 1958  GLUCAP 354*     Radiological Exams on Admission: Dg Chest 2 View  12/10/2012   *RADIOLOGY REPORT*  Clinical Data: Shortness of breath, hyperglycemia.  CHEST - 2 VIEW  Comparison: 07/12/2012  Findings: Heart is upper limits normal in size. Left lung is clear. No effusions.  No acute bony abnormality.  Prior to level vertebroplasty in the lower thoracic spine.  Linear subsegmental atelectasis in the right lung base.  IMPRESSION: Right base atelectasis.   Original Report Authenticated By: Charlett Nose, M.D.  Assessment/Plan Present on Admission:  . Hyperglycemia . Dehydration, moderate UTI +++ Syncope +++ Noncompliance+++ HTN +++  PLAN:  This patient has noncompliance with carb modified diet.  She drinks gallons of sweet tea daily.  She is clearly volume depleted from hyperglycemia.  She also has been on chronic steroid being tapered, and likely will be on a DMARD as well eventually.  She has a UTI  And had a syncopal episode 2 days ago, without hypotension, fever, chills, chest pain or palpitation.  I will admit her, give IV SSI, increase her Metformin to 500mg  BID and Increase her Lisinopril to 20mg  BID as her Cr is normal.  Her BP is elevated in the ER.  For her syncope, suspect volume depletion, but will cycle her troponin and obtain cardiac ECHO.  She is steroid dependance, and I am sure she has mild adrenal  insufficiency, but she is not hypotensive and is not in crisis. I will hold off on increasing her prednisone or stress dose steroid to avoid even further hyperglycemia.  She is stable, full code, and will be admitted to [facility] service.  Thank you for allowing me to partake in the care of this nice patient.  I discuss the importance of dietary compliance, and medical compliance as well (such as follow up with colonoscopy, etc...)  Other plans as per orders.  Code Status: FULL Unk Lightning, MD. [facility] Pager [phone] 7pm to 7am.  12/11/2012, 12:29 AM

## 2012-12-11 NOTE — Progress Notes (Signed)
[facility] PROGRESS NOTE  [name] WUJ:[id number] DOB: 10-22-1935 DOA: 12/10/2012 PCP: Dorrene German, MD  HPI/Subjective: Feels better, denies any chest pain or dizziness.  Assessment/Plan:  UTI -Urinalysis consistent with UTI, patient started empirically on Rocephin. -Antibiotics will be adjusted according to culture results.  Syncope -Likely multifactorial, UTI, volume depletion and hyperglycemia can all contribute. -Patient is not lightheaded any more, aggressive hydration with IV fluids. -Is on telemetry, she still tachycardic continue IV fluids.  Hyperglycemia -Patient has DM type 2, recent steroid taper she still on prednisone. No evidence of acidosis/ketosis -Sliding scale of insulin to control the hyperglycemia treated  Hypertension -Blood pressure seems to be okay, it was elevated in the emergency department. -Patient has 2-D echocardiogram pending, and first set of troponin is negative. -Tachycardic, I think this is likely secondary to volume depletion and dehydration continue hydration with IV fluids.  Diabetes mellitus type 2 -Check hemoglobin A1c, insulin sliding scale. -Carbohydrate modified diet.  Code Status: Full code Family Communication: I discussed with the patient, granddaughter at bedside. Disposition Plan: Full code   Consultants:  None  Procedures:  None  Antibiotics:  Rocephin  Objective: Filed Vitals:   12/10/12 2345 12/11/12 0015 12/11/12 0110 12/11/12 0513  BP: 180/76 157/73 154/73 149/68  Pulse: 109 113 116 119  Temp:   100.1 F (37.8 C) 100.2 F (37.9 C)  TempSrc:   Oral Oral  Resp: 32 23 20 20   Weight:   60.5 kg (133 lb 6.1 oz)   SpO2: 97% 94% 96% 96%    Intake/Output Summary (Last 24 hours) at 12/11/12 1000 Last data filed at 12/11/12 0855  Gross per 24 hour  Intake 922.08 ml  Output      0 ml  Net 922.08 ml   Filed Weights   12/11/12 0110  Weight: 60.5 kg (133 lb 6.1 oz)    Exam:  General:  Alert and awake, oriented x3, not in any acute distress. HEENT: anicteric sclera, pupils reactive to light and accommodation, EOMI CVS: S1-S2 clear, no murmur rubs or gallops Chest: clear to auscultation bilaterally, no wheezing, rales or rhonchi Abdomen: soft nontender, nondistended, normal bowel sounds, no organomegaly Extremities: no cyanosis, clubbing or edema noted bilaterally Neuro: Cranial nerves II-XII intact, no focal neurological deficits   Data Reviewed: Basic Metabolic Panel:  Recent Labs Lab 12/10/12 2001 12/11/12 0117  NA 134*  --   K 3.7  --   CL 93*  --   CO2 26  --   GLUCOSE 341*  --   BUN 10  --   CREATININE 0.70 0.54  CALCIUM 9.5  --    Liver Function Tests:  Recent Labs Lab 12/10/12 2001  AST 15  ALT 14  ALKPHOS 72  BILITOT 0.4  PROT 7.4  ALBUMIN 3.0*   No results found for this basename: LIPASE, AMYLASE,  in the last 168 hours No results found for this basename: AMMONIA,  in the last 168 hours CBC:  Recent Labs Lab 12/10/12 2001 12/11/12 0117  WBC 12.8* 12.2*  NEUTROABS 7.9*  --   HGB 14.0 11.6*  HCT 40.5 34.9*  MCV 86.7 87.3  PLT 388 332   Cardiac Enzymes:  Recent Labs Lab 12/11/12 0115 12/11/12 0809  TROPONINI <0.30 <0.30   BNP (last 3 results) No results found for this basename: PROBNP,  in the last 8760 hours CBG:  Recent Labs Lab 12/10/12 1958 12/10/12 2320 12/11/12 0204 12/11/12 0741  GLUCAP 354* 296* 226* 220*  No results found for this or any previous visit (from the past 240 hour(s)).   Studies: Dg Chest 2 View  12/10/2012   *RADIOLOGY REPORT*  Clinical Data: Shortness of breath, hyperglycemia.  CHEST - 2 VIEW  Comparison: 07/12/2012  Findings: Heart is upper limits normal in size. Left lung is clear. No effusions.  No acute bony abnormality.  Prior to level vertebroplasty in the lower thoracic spine.  Linear subsegmental atelectasis in the right lung base.  IMPRESSION: Right base atelectasis.   Original  Report Authenticated By: Charlett Nose, M.D.    Scheduled Meds: . sodium chloride   Intravenous STAT  . aspirin EC  81 mg Oral Daily  . cefTRIAXone (ROCEPHIN)  IV  1 g Intravenous Q24H  . docusate sodium  100 mg Oral BID  . enoxaparin (LOVENOX) injection  40 mg Subcutaneous Q24H  . folic acid  1 mg Oral Daily  . insulin aspart  0-20 Units Subcutaneous TID WC  . insulin aspart  0-5 Units Subcutaneous QHS  . lisinopril  20 mg Oral BID  . metFORMIN  500 mg Oral BID WC  . methylPREDNISolone  16 mg Oral Q breakfast  . pneumococcal 23 valent vaccine  0.5 mL Intramuscular Once  . sodium chloride  3 mL Intravenous Q12H   Continuous Infusions: . sodium chloride 100 mL/hr at 12/11/12 0201    Principal Problem:   Syncope and collapse Active Problems:   Hyperglycemia   Dehydration, moderate   Noncompliance   UTI (lower urinary tract infection)   Chronic steroid use   Rheumatoid arthritis    Time spent: 35 minutes    [facility] A  [facility] Pager [phone]. If 7PM-7AM, please contact night-coverage at [email or web address], password [facility] 12/11/2012, 10:00 AM  LOS: 1 day

## 2012-12-11 NOTE — Progress Notes (Signed)
Inpatient Diabetes Program Recommendations  AACE/ADA: New Consensus Statement on Inpatient Glycemic Control (2013)  Target Ranges:  Prepandial:   less than 140 mg/dL      Peak postprandial:   less than 180 mg/dL (1-2 hours)      Critically ill patients:  140 - 180 mg/dL   Hyperglycemia on Steroid therapy  Inpatient Diabetes Program Recommendations Insulin - Basal: Plese add lantus 10 units to start either daily or HS HgbA1C: Pllease check current value. Diet: Pt is not eating well.  Please discontinue Metformin while here.  If HgbA1C is greater than or equal to 6.5%, pt has dx of DM  Thank you, Lenor Coffin, RN, CNS, Diabetes Coordinator [phone])

## 2012-12-11 NOTE — Plan of Care (Signed)
Problem: Phase I Progression Outcomes Goal: Initial discharge plan identified Outcome: Completed/Met Date Met:  12/11/12 To Return home

## 2012-12-11 NOTE — Progress Notes (Signed)
INITIAL NUTRITION ASSESSMENT  DOCUMENTATION CODES Per approved criteria  -Non-severe (moderate) malnutrition in the context of acute illness    INTERVENTION:  Resource Breeze daily (250 kcals, 9 gm protein per 8 fl oz carton) RD to follow for nutrition care plan  NUTRITION DIAGNOSIS: Inadequate oral intake related to poor appetite as evidenced by PO intake 20%  Goal: Oral intake with meals & supplements to meet >/= 90% of estimated nutrition needs  Monitor:  PO & supplemental intake, weight, labs, I/O's  Reason for Assessment: Malnutrition Screening Tool Report  77 y.o. female  Admitting Dx: Syncope and collapse  ASSESSMENT: Patient presented to the ER as her CBB has been elevated to 500; saw her PCP, and was also found to have a UTI; while on her way to the PCP, she had a syncope in the taxi ---> admitted for UTI, dehydration, syncope, and hyperglycemia with chronic steroid use.  Patient reports her appetite is rather poor and has been for the past month; PO intake 20% per flowsheet records; states she's lost approximately 7-8 lbs in this time frame; doesn't like Ensure supplements, however, agreeable to try Resource Breeze ---> RD to order.  Patient meets criteria for non-severe (moderate) malnutrition in the context of acute illness given < 75% intake of estimated energy requirement for > 7 days and 5% weight loss x 1 month.  Height: 5 ' 4" (162.5 cm)  Weight: Wt Readings from Last 1 Encounters:  12/11/12 133 lb 6.1 oz (60.5 kg)    Ideal Body Weight: 120 lb  % Ideal Body Weight: 110%  Wt Readings from Last 10 Encounters:  12/11/12 133 lb 6.1 oz (60.5 kg)    Usual Body Weight: 140 lb  % Usual Body Weight: 95%  BMI:  22.9 kg/m2  Estimated Nutritional Needs: Kcal: 1500-1700  Protein: 70-80 gm Fluid: 1.5-1.7 L  Skin: Intact  Diet Order: Carb Control  EDUCATION NEEDS: -No education needs identified at this time   Intake/Output Summary (Last 24 hours)  at 12/11/12 1023 Last data filed at 12/11/12 0855  Gross per 24 hour  Intake 922.08 ml  Output      0 ml  Net 922.08 ml    Last BM: 5/14  Labs:   Recent Labs Lab 12/10/12 2001 12/11/12 0117  NA 134*  --   K 3.7  --   CL 93*  --   CO2 26  --   BUN 10  --   CREATININE 0.70 0.54  CALCIUM 9.5  --   GLUCOSE 341*  --     CBG (last 3)   Recent Labs  12/10/12 2320 12/11/12 0204 12/11/12 0741  GLUCAP 296* 226* 220*    Scheduled Meds: . aspirin EC  81 mg Oral Daily  . cefTRIAXone (ROCEPHIN)  IV  1 g Intravenous Q24H  . docusate sodium  100 mg Oral BID  . enoxaparin (LOVENOX) injection  40 mg Subcutaneous Q24H  . folic acid  1 mg Oral Daily  . insulin aspart  0-20 Units Subcutaneous TID WC  . insulin aspart  0-5 Units Subcutaneous QHS  . lisinopril  20 mg Oral BID  . metFORMIN  500 mg Oral BID WC  . methylPREDNISolone  16 mg Oral Q breakfast  . pneumococcal 23 valent vaccine  0.5 mL Intramuscular Once  . sodium chloride  500 mL Intravenous Once  . sodium chloride  3 mL Intravenous Q12H    Continuous Infusions: . sodium chloride 100 mL/hr at 12/11/12 0201  Past Medical History  Diagnosis Date  . Hypertension   . Type II diabetes mellitus   . Chronic bronchitis     "I've had it alot of times" (12/11/2012)  . Anginal pain   . Exertional shortness of breath   . Headache     "once or twice/month" (12/11/2012)  . Arthritis     "all over my body" (12/11/2012)    Past Surgical History  Procedure Laterality Date  . Back surgery    . Cholecystectomy  1990's  . Appendectomy  1969  . Lumbar disc surgery      "slipped disc" (12/11/2012)  . Shoulder surgery Left ~ 2009    "put cement down in it" (12/11/2012)  . Vaginal hysterectomy  ~ 1973  . Ectopic pregnancy surgery  1969    Maureen Chatters, [place], LDN Pager #: [phone] After-Hours Pager #: [phone]

## 2012-12-11 NOTE — Progress Notes (Signed)
Chaplain visited pt in response to a spiritual consult request. Pt expressed concern about grand daughter's dependence on her. Pt was grateful to God for her recovery from her condition. Chaplain provided ministry of presence, empathic listening, shared words of hope and encouragement, and prayed with pt. Pt thanked chaplain for the visit and spiritual support. Chaplain assured pt of her support through prayer at all time.  12/11/12 1642  Clinical Encounter Type  Visited With Patient  Visit Type Initial;Spiritual support  Referral From Patient;Nurse  Consult/Referral To Chaplain  Spiritual Encounters  Spiritual Needs Prayer;Emotional  Stress Factors  Patient Stress Factors Exhausted;Other (Comment) (stresseed about grandaughter's dependence)  Kelle Darting [phone]

## 2012-12-12 DIAGNOSIS — N390 Urinary tract infection, site not specified: Principal | ICD-10-CM

## 2012-12-12 DIAGNOSIS — R55 Syncope and collapse: Secondary | ICD-10-CM

## 2012-12-12 DIAGNOSIS — M069 Rheumatoid arthritis, unspecified: Secondary | ICD-10-CM

## 2012-12-12 LAB — BASIC METABOLIC PANEL
Creatinine, Ser: 0.45 mg/dL — ABNORMAL LOW (ref 0.50–1.10)
Glucose, Bld: 297 mg/dL — ABNORMAL HIGH (ref 70–99)
Potassium: 3.2 mEq/L — ABNORMAL LOW (ref 3.5–5.1)
Sodium: 140 mEq/L (ref 135–145)

## 2012-12-12 LAB — URINE CULTURE

## 2012-12-12 LAB — GLUCOSE, CAPILLARY
Glucose-Capillary: 233 mg/dL — ABNORMAL HIGH (ref 70–99)
Glucose-Capillary: 233 mg/dL — ABNORMAL HIGH (ref 70–99)

## 2012-12-12 LAB — CBC
HCT: 30.6 % — ABNORMAL LOW (ref 36.0–46.0)
MCV: 85.7 fL (ref 78.0–100.0)
RBC: 3.57 MIL/uL — ABNORMAL LOW (ref 3.87–5.11)
RDW: 15 % (ref 11.5–15.5)
WBC: 12.1 10*3/uL — ABNORMAL HIGH (ref 4.0–10.5)

## 2012-12-12 MED ORDER — INSULIN GLARGINE 100 UNIT/ML ~~LOC~~ SOLN
15.0000 [IU] | Freq: Every day | SUBCUTANEOUS | Status: DC
  Administered 2012-12-12 – 2012-12-14 (×3): 15 [IU] via SUBCUTANEOUS
  Filled 2012-12-12 (×4): qty 0.15

## 2012-12-12 MED ORDER — METFORMIN HCL 500 MG PO TABS
1000.0000 mg | ORAL_TABLET | Freq: Two times a day (BID) | ORAL | Status: DC
  Administered 2012-12-12 – 2012-12-15 (×7): 1000 mg via ORAL
  Filled 2012-12-12 (×8): qty 2

## 2012-12-12 MED ORDER — POTASSIUM CHLORIDE CRYS ER 20 MEQ PO TBCR
EXTENDED_RELEASE_TABLET | ORAL | Status: AC
  Filled 2012-12-12: qty 2

## 2012-12-12 MED ORDER — SALINE SPRAY 0.65 % NA SOLN
1.0000 | NASAL | Status: DC | PRN
  Filled 2012-12-12: qty 44

## 2012-12-12 MED ORDER — OXYCODONE-ACETAMINOPHEN 5-325 MG PO TABS
1.0000 | ORAL_TABLET | ORAL | Status: DC | PRN
  Administered 2012-12-12 – 2012-12-15 (×8): 2 via ORAL
  Filled 2012-12-12 (×8): qty 2

## 2012-12-12 MED ORDER — POTASSIUM CHLORIDE CRYS ER 20 MEQ PO TBCR
40.0000 meq | EXTENDED_RELEASE_TABLET | Freq: Four times a day (QID) | ORAL | Status: AC
  Administered 2012-12-12 (×2): 40 meq via ORAL
  Filled 2012-12-12: qty 2

## 2012-12-12 MED ORDER — WHITE PETROLATUM GEL
Status: AC
  Filled 2012-12-12: qty 5

## 2012-12-12 NOTE — Progress Notes (Signed)
[facility] PROGRESS NOTE  [name] ZOX:[id number] DOB: 04-09-1936 DOA: 12/10/2012 PCP: Dorrene German, MD  HPI/Subjective: Complaining about frontal headache.  Assessment/Plan:  UTI -Urinalysis consistent with UTI, patient started empirically on Rocephin. -Antibiotics will be adjusted according to culture results.  Syncope -Likely multifactorial, UTI, volume depletion and hyperglycemia can all contribute. -Patient is not lightheaded any more, aggressive hydration with IV fluids. -Is on telemetry, she still tachycardic continue IV fluids. -Because of the headache I will get MRI of the brain.  Hyperglycemia -Patient has DM type 2, recent steroid taper she still on prednisone. No evidence of acidosis/ketosis -Sliding scale of insulin to control the hyperglycemia treated  Hypertension -Blood pressure seems to be okay, it was elevated in the emergency department. -Patient has 2-D echocardiogram pending, and first set of troponin is negative. -Tachycardic, I think this is likely secondary to volume depletion and dehydration continue hydration with IV fluids.  Diabetes mellitus type 2 -Hemoglobin A1c of 11.9 correlate with neoplasm glucose of 295, insulin sliding scale. -Carbohydrate modified diet. -Patient will be discharged on insulin, start on Lantus insulin today.  Code Status: Full code Family Communication: I discussed with the patient, granddaughter at bedside. Disposition Plan: Full code   Consultants:  None  Procedures:  None  Antibiotics:  Rocephin  Objective: Filed Vitals:   12/11/12 1327 12/11/12 2011 12/11/12 2127 12/12/12 0505  BP: 133/66 136/67  138/74  Pulse: 113 98  96  Temp: 98.7 F (37.1 C) 98 F (36.7 C)  98.6 F (37 C)  TempSrc: Oral Oral  Oral  Resp: 20 20  18   Height:   5\' 5"  (1.651 m)   Weight:   64.6 kg (142 lb 6.7 oz)   SpO2: 95% 96%  99%    Intake/Output Summary (Last 24 hours) at 12/12/12 1011 Last data filed at  12/11/12 1300  Gross per 24 hour  Intake    236 ml  Output      0 ml  Net    236 ml   Filed Weights   12/11/12 0110 12/11/12 2127  Weight: 60.5 kg (133 lb 6.1 oz) 64.6 kg (142 lb 6.7 oz)    Exam:  General: Alert and awake, oriented x3, not in any acute distress. HEENT: anicteric sclera, pupils reactive to light and accommodation, EOMI CVS: S1-S2 clear, no murmur rubs or gallops Chest: clear to auscultation bilaterally, no wheezing, rales or rhonchi Abdomen: soft nontender, nondistended, normal bowel sounds, no organomegaly Extremities: no cyanosis, clubbing or edema noted bilaterally Neuro: Cranial nerves II-XII intact, no focal neurological deficits   Data Reviewed: Basic Metabolic Panel:  Recent Labs Lab 12/10/12 2001 12/11/12 0117 12/12/12 0610  NA 134*  --  140  K 3.7  --  3.2*  CL 93*  --  106  CO2 26  --  22  GLUCOSE 341*  --  297*  BUN 10  --  5*  CREATININE 0.70 0.54 0.45*  CALCIUM 9.5  --  8.1*   Liver Function Tests:  Recent Labs Lab 12/10/12 2001  AST 15  ALT 14  ALKPHOS 72  BILITOT 0.4  PROT 7.4  ALBUMIN 3.0*   No results found for this basename: LIPASE, AMYLASE,  in the last 168 hours No results found for this basename: AMMONIA,  in the last 168 hours CBC:  Recent Labs Lab 12/10/12 2001 12/11/12 0117 12/12/12 0610  WBC 12.8* 12.2* 12.1*  NEUTROABS 7.9*  --   --   HGB 14.0 11.6* 10.3*  HCT 40.5 34.9* 30.6*  MCV 86.7 87.3 85.7  PLT 388 332 331   Cardiac Enzymes:  Recent Labs Lab 12/11/12 0115 12/11/12 0809 12/11/12 1332  TROPONINI <0.30 <0.30 <0.30   BNP (last 3 results) No results found for this basename: PROBNP,  in the last 8760 hours CBG:  Recent Labs Lab 12/11/12 0741 12/11/12 1143 12/11/12 1634 12/11/12 2127 12/12/12 0756  GLUCAP 220* 251* 332* 295* 233*    No results found for this or any previous visit (from the past 240 hour(s)).   Studies: Dg Chest 2 View  12/10/2012   *RADIOLOGY REPORT*  Clinical Data:  Shortness of breath, hyperglycemia.  CHEST - 2 VIEW  Comparison: 07/12/2012  Findings: Heart is upper limits normal in size. Left lung is clear. No effusions.  No acute bony abnormality.  Prior to level vertebroplasty in the lower thoracic spine.  Linear subsegmental atelectasis in the right lung base.  IMPRESSION: Right base atelectasis.   Original Report Authenticated By: Charlett Nose, M.D.    Scheduled Meds: . aspirin EC  81 mg Oral Daily  . cefTRIAXone (ROCEPHIN)  IV  1 g Intravenous Q24H  . docusate sodium  100 mg Oral BID  . enoxaparin (LOVENOX) injection  40 mg Subcutaneous Q24H  . feeding supplement  1 Container Oral Q lunch  . folic acid  1 mg Oral Daily  . insulin aspart  0-20 Units Subcutaneous TID WC  . insulin aspart  0-5 Units Subcutaneous QHS  . lisinopril  20 mg Oral BID  . metFORMIN  500 mg Oral BID WC  . methylPREDNISolone  16 mg Oral Q breakfast  . potassium chloride SA      . potassium chloride  40 mEq Oral Q6H  . sodium chloride  3 mL Intravenous Q12H  . white petrolatum       Continuous Infusions: . sodium chloride 100 mL/hr (12/12/12 0425)    Principal Problem:   Syncope and collapse Active Problems:   Hyperglycemia   Dehydration, moderate   Noncompliance   UTI (lower urinary tract infection)   Chronic steroid use   Rheumatoid arthritis    Time spent: 35 minutes    [facility] A  [facility] Pager [phone]. If 7PM-7AM, please contact night-coverage at [email or web address], password [facility] 12/12/2012, 10:11 AM  LOS: 2 days

## 2012-12-13 ENCOUNTER — Inpatient Hospital Stay (HOSPITAL_COMMUNITY): Payer: No Typology Code available for payment source

## 2012-12-13 LAB — GLUCOSE, CAPILLARY
Glucose-Capillary: 141 mg/dL — ABNORMAL HIGH (ref 70–99)
Glucose-Capillary: 234 mg/dL — ABNORMAL HIGH (ref 70–99)
Glucose-Capillary: 213 mg/dL — ABNORMAL HIGH (ref 70–99)
Glucose-Capillary: 81 mg/dL (ref 70–99)

## 2012-12-13 LAB — CBC
Hemoglobin: 9.8 g/dL — ABNORMAL LOW (ref 12.0–15.0)
RBC: 3.42 MIL/uL — ABNORMAL LOW (ref 3.87–5.11)

## 2012-12-13 LAB — BASIC METABOLIC PANEL
GFR calc Af Amer: >90 mL/min (ref >90)
GFR calc non Af Amer: >90 mL/min (ref >90)
Potassium: 3.4 mEq/L — ABNORMAL LOW (ref 3.5–5.1)
Sodium: 141 mEq/L (ref 135–145)

## 2012-12-13 MED ORDER — SODIUM CHLORIDE 0.9 % IV SOLN
500.0000 mg | Freq: Two times a day (BID) | INTRAVENOUS | Status: DC
  Administered 2012-12-13 – 2012-12-15 (×5): 500 mg via INTRAVENOUS
  Filled 2012-12-13 (×6): qty 500

## 2012-12-13 MED ORDER — POTASSIUM CHLORIDE CRYS ER 20 MEQ PO TBCR
40.0000 meq | EXTENDED_RELEASE_TABLET | Freq: Four times a day (QID) | ORAL | Status: DC
  Administered 2012-12-13: 40 meq via ORAL
  Filled 2012-12-13: qty 2

## 2012-12-13 MED ORDER — POTASSIUM CHLORIDE CRYS ER 20 MEQ PO TBCR
60.0000 meq | EXTENDED_RELEASE_TABLET | Freq: Four times a day (QID) | ORAL | Status: AC
  Administered 2012-12-13: 60 meq via ORAL
  Filled 2012-12-13: qty 1

## 2012-12-13 NOTE — Progress Notes (Signed)
Patient ambulated to bathroom and then became nauseated and had a headache.  Treated, will continue to monitor.

## 2012-12-13 NOTE — Progress Notes (Signed)
ANTIBIOTIC CONSULT NOTE - INITIAL  Pharmacy Consult for vancomycin Indication: MRSA UTI  No Known Allergies  Patient Measurements: Height: 5\' 5"  (165.1 cm) Weight: 142 lb 6.7 oz (64.6 kg) IBW/kg (Calculated) : 57   Vital Signs: Temp: 98.2 F (36.8 C) (05/18 0505) Temp src: Oral (05/18 0505) BP: 129/55 mmHg (05/18 0505) Pulse Rate: 98 (05/18 0505) Intake/Output from previous day: 05/17 0701 - 05/18 0700 In: 118 [P.O.:118] Out: -  Intake/Output from this shift:    Labs:  Recent Labs  12/11/12 0117 12/12/12 0610 12/13/12 0540  WBC 12.2* 12.1* 14.8*  HGB 11.6* 10.3* 9.8*  PLT 332 331 369  CREATININE 0.54 0.45* 0.51   Estimated Creatinine Clearance: 55.5 ml/min (by C-G formula based on Cr of 0.51). No results found for this basename: VANCOTROUGH, VANCOPEAK, VANCORANDOM, GENTTROUGH, GENTPEAK, GENTRANDOM, TOBRATROUGH, TOBRAPEAK, TOBRARND, AMIKACINPEAK, AMIKACINTROU, AMIKACIN,  in the last 72 hours   Microbiology: Recent Results (from the past 720 hour(s))  URINE CULTURE     Status: None   Collection Time    12/10/12 10:02 PM      Result Value Range Status   Specimen Description URINE, CLEAN CATCH   Final   Special Requests NONE   Final   Culture  Setup Time 12/10/2012 23:08   Final   Colony Count >=100,000 COLONIES/ML   Final   Culture     Final   Value: METHICILLIN RESISTANT STAPHYLOCOCCUS AUREUS     Note: RIFAMPIN AND GENTAMICIN SHOULD NOT BE USED AS SINGLE DRUGS FOR TREATMENT OF STAPH INFECTIONS.   Report Status 12/12/2012 FINAL   Final   Organism ID, Bacteria METHICILLIN RESISTANT STAPHYLOCOCCUS AUREUS   Final    Medical History: Past Medical History  Diagnosis Date  . Hypertension   . Type II diabetes mellitus   . Chronic bronchitis     "I've had it alot of times" (12/11/2012)  . Anginal pain   . Exertional shortness of breath   . Headache     "once or twice/month" (12/11/2012)  . Arthritis     "all over my body" (12/11/2012)    Medications:   Scheduled:  . aspirin EC  81 mg Oral Daily  . docusate sodium  100 mg Oral BID  . enoxaparin (LOVENOX) injection  40 mg Subcutaneous Q24H  . feeding supplement  1 Container Oral Q lunch  . folic acid  1 mg Oral Daily  . insulin aspart  0-20 Units Subcutaneous TID WC  . insulin aspart  0-5 Units Subcutaneous QHS  . insulin glargine  15 Units Subcutaneous QHS  . lisinopril  20 mg Oral BID  . metFORMIN  1,000 mg Oral BID WC  . methylPREDNISolone  16 mg Oral Q breakfast  . potassium chloride  40 mEq Oral Q6H  . sodium chloride  3 mL Intravenous Q12H   Infusions:    Assessment: 77 yo F admitted 5/15 now found to have MRSA UTI.  Previously on empiric rocephin for UTI after failing cipro as an outpatient.  Of note, on chronic steroids for RA.  5/12: cipro PO as outpatient>>5/15 5/15: rocephin>>5/18 5/18: vancomycin initiated today  5/15: urine cx: MRSA 5/18: bld cx x1: in process of being drawn  Goal of Therapy:  Vancomycin trough level 15-20 mcg/ml  Plan:  - After blood cultures drawn (in process of drawing per phlebotomy, start vancomycin 500 mg IV q12h - Follow up SCr, UOP, cultures, clinical course and adjust as clinically indicated.  Dameshia Seybold L. Illene Bolus, PharmD, BCPS Clinical Pharmacist  Pager: [phone] Pharmacy: [phone] 12/13/2012 8:40 AM

## 2012-12-13 NOTE — Progress Notes (Signed)
[facility] PROGRESS NOTE  [name] HQI:[id number] DOB: [birth year] DOA: 12/10/2012 PCP: Dorrene German, MD  HPI/Subjective: Headache is better, less complaints today  Assessment/Plan:  MRSA UTI -Was on Rocephin, cultures come back positive for MRSA, antibiotic switched to vancomycin -Can probably be discharged on oral doxycycline. -Blood cultures obtained  Syncope -Likely multifactorial, UTI, volume depletion and hyperglycemia can all contribute. -Patient is not lightheaded any more, aggressive hydration with IV fluids. -Is on telemetry, she still tachycardic continue IV fluids. -MRI done, report is pending.  Hyperglycemia -Patient has DM type 2, recent steroid taper she still on prednisone. No evidence of acidosis/ketosis -Sliding scale of insulin to control the hyperglycemia treated, I will discontinue the steroids.  Hypertension -Blood pressure seems to be okay, it was elevated in the emergency department. -Patient has 2-D echocardiogram pending, and first set of troponin is negative. -Tachycardic, I think this is likely secondary to volume depletion and dehydration continue hydration with IV fluids.  Diabetes mellitus type 2 -Hemoglobin A1c of 11.9 correlate with neoplasm glucose of 295, insulin sliding scale. -Carbohydrate modified diet. -Patient will be discharged on insulin, start on Lantus insulin today.  Code Status: Full code Family Communication: I discussed with the patient, granddaughter at bedside. Disposition Plan: Full code   Consultants:  None  Procedures:  None  Antibiotics:  Rocephin  Objective: Filed Vitals:   12/12/12 0505 12/12/12 1454 12/12/12 2156 12/13/12 0505  BP: 138/74 127/65 124/55 129/55  Pulse: 96 93 102 98  Temp: 98.6 F (37 C) 97.5 F (36.4 C) 98.4 F (36.9 C) 98.2 F (36.8 C)  TempSrc: Oral Oral Oral Oral  Resp: 18 18 18 18   Height:      Weight:      SpO2: 99% 96% 97% 95%    Intake/Output Summary (Last  24 hours) at 12/13/12 1247 Last data filed at 12/13/12 0900  Gross per 24 hour  Intake    236 ml  Output      0 ml  Net    236 ml   Filed Weights   12/11/12 0110 12/11/12 2127  Weight: 60.5 kg (133 lb 6.1 oz) 64.6 kg (142 lb 6.7 oz)    Exam:  General: Alert and awake, oriented x3, not in any acute distress. HEENT: anicteric sclera, pupils reactive to light and accommodation, EOMI CVS: S1-S2 clear, no murmur rubs or gallops Chest: clear to auscultation bilaterally, no wheezing, rales or rhonchi Abdomen: soft nontender, nondistended, normal bowel sounds, no organomegaly Extremities: no cyanosis, clubbing or edema noted bilaterally Neuro: Cranial nerves II-XII intact, no focal neurological deficits   Data Reviewed: Basic Metabolic Panel:  Recent Labs Lab 12/10/12 2001 12/11/12 0117 12/12/12 0610 12/13/12 0540  NA 134*  --  140 141  K 3.7  --  3.2* 3.4*  CL 93*  --  106 109  CO2 26  --  22 24  GLUCOSE 341*  --  297* 134*  BUN 10  --  5* 4*  CREATININE 0.70 0.54 0.45* 0.51  CALCIUM 9.5  --  8.1* 8.1*   Liver Function Tests:  Recent Labs Lab 12/10/12 2001  AST 15  ALT 14  ALKPHOS 72  BILITOT 0.4  PROT 7.4  ALBUMIN 3.0*   No results found for this basename: LIPASE, AMYLASE,  in the last 168 hours No results found for this basename: AMMONIA,  in the last 168 hours CBC:  Recent Labs Lab 12/10/12 2001 12/11/12 0117 12/12/12 0610 12/13/12 0540  WBC 12.8* 12.2* 12.1*  14.8*  NEUTROABS 7.9*  --   --   --   HGB 14.0 11.6* 10.3* 9.8*  HCT 40.5 34.9* 30.6* 29.5*  MCV 86.7 87.3 85.7 86.3  PLT 388 332 331 369   Cardiac Enzymes:  Recent Labs Lab 12/11/12 0115 12/11/12 0809 12/11/12 1332  TROPONINI <0.30 <0.30 <0.30   BNP (last 3 results) No results found for this basename: PROBNP,  in the last 8760 hours CBG:  Recent Labs Lab 12/12/12 1207 12/12/12 1716 12/12/12 2154 12/13/12 0758 12/13/12 1201  GLUCAP 319* 233* 238* 141* 234*    Recent Results  (from the past 240 hour(s))  URINE CULTURE     Status: None   Collection Time    12/10/12 10:02 PM      Result Value Range Status   Specimen Description URINE, CLEAN CATCH   Final   Special Requests NONE   Final   Culture  Setup Time 12/10/2012 23:08   Final   Colony Count >=100,000 COLONIES/ML   Final   Culture     Final   Value: METHICILLIN RESISTANT STAPHYLOCOCCUS AUREUS     Note: RIFAMPIN AND GENTAMICIN SHOULD NOT BE USED AS SINGLE DRUGS FOR TREATMENT OF STAPH INFECTIONS.   Report Status 12/12/2012 FINAL   Final   Organism ID, Bacteria METHICILLIN RESISTANT STAPHYLOCOCCUS AUREUS   Final     Studies: No results found.  Scheduled Meds: . aspirin EC  81 mg Oral Daily  . docusate sodium  100 mg Oral BID  . enoxaparin (LOVENOX) injection  40 mg Subcutaneous Q24H  . feeding supplement  1 Container Oral Q lunch  . folic acid  1 mg Oral Daily  . insulin aspart  0-20 Units Subcutaneous TID WC  . insulin aspart  0-5 Units Subcutaneous QHS  . insulin glargine  15 Units Subcutaneous QHS  . lisinopril  20 mg Oral BID  . metFORMIN  1,000 mg Oral BID WC  . methylPREDNISolone  16 mg Oral Q breakfast  . potassium chloride  40 mEq Oral Q6H  . sodium chloride  3 mL Intravenous Q12H  . vancomycin  500 mg Intravenous Q12H   Continuous Infusions:    Principal Problem:   Syncope and collapse Active Problems:   Hyperglycemia   Dehydration, moderate   Noncompliance   UTI (lower urinary tract infection)   Chronic steroid use   Rheumatoid arthritis    Time spent: 35 minutes    [facility] A  [facility] Pager [phone]. If 7PM-7AM, please contact night-coverage at [email or web address], password [facility] 12/13/2012, 12:47 PM  LOS: 3 days

## 2012-12-14 LAB — CBC
HCT: 31.7 % — ABNORMAL LOW (ref 36.0–46.0)
MCHC: 32.8 g/dL (ref 30.0–36.0)
Platelets: 389 10*3/uL (ref 150–400)
RDW: 16.2 % — ABNORMAL HIGH (ref 11.5–15.5)
WBC: 13.4 10*3/uL — ABNORMAL HIGH (ref 4.0–10.5)

## 2012-12-14 LAB — BASIC METABOLIC PANEL
BUN: 3 mg/dL — ABNORMAL LOW (ref 6–23)
Chloride: 108 mEq/L (ref 96–112)
Creatinine, Ser: 0.46 mg/dL — ABNORMAL LOW (ref 0.50–1.10)
GFR calc Af Amer: >90 mL/min (ref >90)
GFR calc non Af Amer: >90 mL/min (ref >90)
Potassium: 4.1 mEq/L (ref 3.5–5.1)

## 2012-12-14 LAB — GLUCOSE, CAPILLARY
Glucose-Capillary: 89 mg/dL (ref 70–99)
Glucose-Capillary: 80 mg/dL (ref 70–99)
Glucose-Capillary: 126 mg/dL — ABNORMAL HIGH (ref 70–99)

## 2012-12-14 MED ORDER — ALUM & MAG HYDROXIDE-SIMETH 200-200-20 MG/5ML PO SUSP
15.0000 mL | Freq: Four times a day (QID) | ORAL | Status: DC | PRN
  Administered 2012-12-14: 15 mL via ORAL
  Filled 2012-12-14: qty 30

## 2012-12-14 MED ORDER — PROMETHAZINE HCL 25 MG/ML IJ SOLN
12.5000 mg | Freq: Once | INTRAMUSCULAR | Status: AC
  Administered 2012-12-14: 12.5 mg via INTRAVENOUS
  Filled 2012-12-14: qty 1

## 2012-12-14 MED ORDER — SUMATRIPTAN SUCCINATE 50 MG PO TABS
50.0000 mg | ORAL_TABLET | ORAL | Status: DC | PRN
  Administered 2012-12-14 – 2012-12-15 (×3): 50 mg via ORAL
  Filled 2012-12-14 (×3): qty 1

## 2012-12-14 NOTE — ED Provider Notes (Signed)
I saw and evaluated the patient, reviewed the resident's note and I agree with the findings and plan.   Celene Kras, MD 12/14/12 [phone]

## 2012-12-14 NOTE — Progress Notes (Signed)
Patient called out crying in pain with a headache and nausea.  Responded with medication, assisted to bathroom.  Lights make her head hurt worse.  Assisted back to bed, applied a warm wash cloth to forehead.  Will continue to monitor.

## 2012-12-14 NOTE — Clinical Documentation Improvement (Signed)
MALNUTRITION DOCUMENTATION CLARIFICATION  THIS DOCUMENT IS NOT A PERMANENT PART OF THE MEDICAL RECORD  TO RESPOND TO THE THIS QUERY, FOLLOW THE INSTRUCTIONS BELOW:  1. If needed, update documentation for the patient's encounter via the notes activity.  2. Access this query again and click edit on the In [employer].  3. After updating, or not, click F2 to complete all highlighted (required) fields concerning your review. Select "additional documentation in the medical record" OR "no additional documentation provided".  4. Click Sign note button.  5. The deficiency will fall out of your In Basket *Please let us know if you are not able to complete this workflow by phone or e-mail (listed below).  Please update your documentation within the medical record to reflect your response to this query.                                                                                        12/14/12   Dr. Clydia Llano / [facility],  In a better effort to capture your patient's severity of illness, reflect appropriate length of stay and utilization of resources, a review of the patient medical record has revealed the following indicators.    Based on your clinical judgment, please clarify and document in a progress note and/or discharge summary the clinical condition associated with the following supporting information:  In responding to this query please exercise your independent judgment.  The fact that a query is asked, does not imply that any particular answer is desired or expected.  Per nutrition note on 5/16 patient meeting criteria for "moderate malnutrition" due to  "inadequate oral intake related to poor appetite as evidenced by PO intake 20%" , has lost 7 - 8 lbs over 1 month.  Please review following clinical conditions and document if appropriate. Thank you    Possible Clinical Conditions?  Moderate Malnutrition  Severe Malnutrition    Protein Calorie Malnutrition  Severe Protein  Calorie Malnutrition   Other Condition  Cannot clinically determine     Ht 5 ' 4" (162.5 cm)  Wt 33 lb 6.1 oz (60.5 kg)    BMI:  22.9 kg/m2   Weight  Loss 7-8 pounds over 1 month (5% wt loss)     Nutrition Consult: recommendations Resource Breeze daily (250 kcals, 9 gm protein per 8 fl oz carton)    You may use possible, probable, or suspect with inpatient documentation. possible, probable, suspected diagnoses MUST be documented at the time of discharge  Reviewed: additional documentation in the medical record  Thank [facility]  Clinical Documentation Specialist: Pager [phone]/ off [phone]  Health Information Management [place]

## 2012-12-14 NOTE — Progress Notes (Signed)
[facility] PROGRESS NOTE  [name] YQM:[id number] DOB: Oct 09, 1935 DOA: 12/10/2012 PCP: Dorrene German, MD  HPI/Subjective: MRI was done, no abnormal findings to explain headaches. Did well yesterday, complaining about severe headache this morning.  Assessment/Plan:  MRSA UTI -Was on Rocephin, cultures come back positive for MRSA, antibiotic switched to vancomycin -Can probably be discharged on oral doxycycline. -Blood cultures obtained and pending  Headache -Patient complaining about frontal headache, MRI is negative. -I will add Imitrex for her headache. No meningeal signs.  Syncope -Likely multifactorial, UTI, volume depletion and hyperglycemia can all contribute. -Patient is not lightheaded any more, aggressive hydration with IV fluids. -Is on telemetry, she still tachycardic continue IV fluids. -MRI done, no abnormal findings  Hyperglycemia -Patient has DM type 2, recent steroid taper she still on prednisone. No evidence of acidosis/ketosis -Sliding scale of insulin to control the hyperglycemia treated, I will discontinue the steroids.  Hypertension -Blood pressure seems to be okay, it was elevated in the emergency department. -Patient has 2-D echocardiogram pending, and first set of troponin is negative. -Tachycardic, I think this is likely secondary to volume depletion and dehydration continue hydration with IV fluids.  Diabetes mellitus type 2 -Hemoglobin A1c of 11.9 correlate with neoplasm glucose of 295, insulin sliding scale. -Carbohydrate modified diet. -Patient will be discharged on insulin, start on Lantus insulin today.  Code Status: Full code Family Communication: I discussed with the patient, granddaughter at bedside. Disposition Plan: Full code   Consultants:  None  Procedures:  None  Antibiotics:  Rocephin  Objective: Filed Vitals:   12/13/12 1349 12/13/12 2143 12/14/12 0555 12/14/12 1004  BP: 131/49 126/76 143/80 134/76   Pulse: 103 94 94   Temp: 98.2 F (36.8 C) 98.7 F (37.1 C) 98 F (36.7 C)   TempSrc: Oral Oral Oral   Resp: 18 18 18    Height:      Weight:      SpO2: 97% 94% 93%     Intake/Output Summary (Last 24 hours) at 12/14/12 1059 Last data filed at 12/13/12 1350  Gross per 24 hour  Intake    100 ml  Output      0 ml  Net    100 ml   Filed Weights   12/11/12 0110 12/11/12 2127  Weight: 60.5 kg (133 lb 6.1 oz) 64.6 kg (142 lb 6.7 oz)    Exam:  General: Alert and awake, oriented x3, not in any acute distress. HEENT: anicteric sclera, pupils reactive to light and accommodation, EOMI CVS: S1-S2 clear, no murmur rubs or gallops Chest: clear to auscultation bilaterally, no wheezing, rales or rhonchi Abdomen: soft nontender, nondistended, normal bowel sounds, no organomegaly Extremities: no cyanosis, clubbing or edema noted bilaterally Neuro: Cranial nerves II-XII intact, no focal neurological deficits   Data Reviewed: Basic Metabolic Panel:  Recent Labs Lab 12/10/12 2001 12/11/12 0117 12/12/12 0610 12/13/12 0540 12/14/12 0515  NA 134*  --  140 141 141  K 3.7  --  3.2* 3.4* 4.1  CL 93*  --  106 109 108  CO2 26  --  22 24 24   GLUCOSE 341*  --  297* 134* 112*  BUN 10  --  5* 4* 3*  CREATININE 0.70 0.54 0.45* 0.51 0.46*  CALCIUM 9.5  --  8.1* 8.1* 8.6   Liver Function Tests:  Recent Labs Lab 12/10/12 2001  AST 15  ALT 14  ALKPHOS 72  BILITOT 0.4  PROT 7.4  ALBUMIN 3.0*   No results found for  this basename: LIPASE, AMYLASE,  in the last 168 hours No results found for this basename: AMMONIA,  in the last 168 hours CBC:  Recent Labs Lab 12/10/12 2001 12/11/12 0117 12/12/12 0610 12/13/12 0540 12/14/12 0515  WBC 12.8* 12.2* 12.1* 14.8* 13.4*  NEUTROABS 7.9*  --   --   --   --   HGB 14.0 11.6* 10.3* 9.8* 10.4*  HCT 40.5 34.9* 30.6* 29.5* 31.7*  MCV 86.7 87.3 85.7 86.3 87.8  PLT 388 332 331 369 389   Cardiac Enzymes:  Recent Labs Lab 12/11/12 0115  12/11/12 0809 12/11/12 1332  TROPONINI <0.30 <0.30 <0.30   BNP (last 3 results) No results found for this basename: PROBNP,  in the last 8760 hours CBG:  Recent Labs Lab 12/13/12 0758 12/13/12 1201 12/13/12 1612 12/13/12 2139 12/14/12 0737  GLUCAP 141* 234* 213* 81 89    Recent Results (from the past 240 hour(s))  URINE CULTURE     Status: None   Collection Time    12/10/12 10:02 PM      Result Value Range Status   Specimen Description URINE, CLEAN CATCH   Final   Special Requests NONE   Final   Culture  Setup Time 12/10/2012 23:08   Final   Colony Count >=100,000 COLONIES/ML   Final   Culture     Final   Value: METHICILLIN RESISTANT STAPHYLOCOCCUS AUREUS     Note: RIFAMPIN AND GENTAMICIN SHOULD NOT BE USED AS SINGLE DRUGS FOR TREATMENT OF STAPH INFECTIONS.   Report Status 12/12/2012 FINAL   Final   Organism ID, Bacteria METHICILLIN RESISTANT STAPHYLOCOCCUS AUREUS   Final  CULTURE, BLOOD (ROUTINE X 2)     Status: None   Collection Time    12/13/12  8:30 AM      Result Value Range Status   Specimen Description BLOOD RIGHT HAND   Final   Special Requests BOTTLES DRAWN AEROBIC AND ANAEROBIC 10CC   Final   Culture  Setup Time 12/13/2012 13:59   Final   Culture     Final   Value:        BLOOD CULTURE RECEIVED NO GROWTH TO DATE CULTURE WILL BE HELD FOR 5 DAYS BEFORE ISSUING A FINAL NEGATIVE REPORT   Report Status PENDING   Incomplete  CULTURE, BLOOD (ROUTINE X 2)     Status: None   Collection Time    12/13/12  8:40 AM      Result Value Range Status   Specimen Description BLOOD LEFT HAND   Final   Special Requests BOTTLES DRAWN AEROBIC AND ANAEROBIC 10CC   Final   Culture  Setup Time 12/13/2012 13:59   Final   Culture     Final   Value:        BLOOD CULTURE RECEIVED NO GROWTH TO DATE CULTURE WILL BE HELD FOR 5 DAYS BEFORE ISSUING A FINAL NEGATIVE REPORT   Report Status PENDING   Incomplete     Studies: Mr Brain 7 Contrast  12/13/2012   *RADIOLOGY REPORT*  Clinical  Data:  Frontal headache.  Hypertension.  Syncope and collapse.  Hyperglycemia.  Chronic steroid use.  Rheumatoid arthritis.  MRI HEAD WITHOUT CONTRAST  Technique:  Multiplanar, multiecho pulse sequences of the brain and surrounding structures were obtained according to standard protocol without intravenous contrast.  Comparison: None.  Findings: There is no evidence for acute infarction, intracranial hemorrhage, mass lesion, hydrocephalus, or extra-axial fluid.  Mild atrophy.  Periventricular greater than subcortical white matter signal  abnormality, likely chronic microvascular ischemic change. No foci of chronic hemorrhage.  Flow voids are maintained in the major intracranial vessels.  Calvarium and skull base intact.  Mild pannus surrounds the C1-2 articulation consistent with history of rheumatoid arthritis.  No C1-C2 malalignment or cervicomedullary compression.  Paranasal sinuses and orbits unremarkable.  No acute mastoid fluid.  IMPRESSION: Mild age related changes as described.  Mild pannus.  No acute intracranial findings. No acute stroke or hemorrhage.  No evidence for vertebrobasilar insufficiency no acute sinus pathology to suggest cause for the patient's headaches.   Original Report Authenticated By: Davonna Belling, M.D.    Scheduled Meds: . aspirin EC  81 mg Oral Daily  . docusate sodium  100 mg Oral BID  . enoxaparin (LOVENOX) injection  40 mg Subcutaneous Q24H  . feeding supplement  1 Container Oral Q lunch  . folic acid  1 mg Oral Daily  . insulin aspart  0-20 Units Subcutaneous TID WC  . insulin aspart  0-5 Units Subcutaneous QHS  . insulin glargine  15 Units Subcutaneous QHS  . lisinopril  20 mg Oral BID  . metFORMIN  1,000 mg Oral BID WC  . sodium chloride  3 mL Intravenous Q12H  . vancomycin  500 mg Intravenous Q12H   Continuous Infusions:    Principal Problem:   Syncope and collapse Active Problems:   Hyperglycemia   Dehydration, moderate   Noncompliance   UTI (lower  urinary tract infection)   Chronic steroid use   Rheumatoid arthritis    Time spent: 35 minutes    [facility]  [facility] Pager [phone]. If 7PM-7AM, please contact night-coverage at [email or web address], password [facility] 12/14/2012, 10:59 AM  LOS: 4 days

## 2012-12-14 NOTE — Plan of Care (Signed)
Problem: Phase II Progression Outcomes Goal: Discharge plan established Outcome: Completed/Met Date Met:  12/14/12 To return home

## 2012-12-14 NOTE — Progress Notes (Addendum)
Inpatient Diabetes Program Recommendations  AACE/ADA: New Consensus Statement on Inpatient Glycemic Control (2013)  Target Ranges:  Prepandial:   less than 140 mg/dL      Peak postprandial:   less than 180 mg/dL (1-2 hours)      Critically ill patients:  140 - 180 mg/dL   Noted RUEA5W at 09.8%, however this value is a bit skewed due to the hyperglycemia within the past 2 weeks with UTI.  Glucose values are extremely high as well on steroid therapy. Although Lantus is a basal insulin, I am concerned for her to be discharged on insulin of any kind.  Pt is eating only 25%. Fasting glucose this am was 89 mg/dL, quite down from glucose values while on steroid therapy.  My concern is hypoglycemia in the am as lantus primary affect is on the fasting glucose. I will talk with patient and assess how well she would manage insulin. It is documented that this patient has not been compliant with diet, therefore diet needs to  be stressed.  In addition, it is stated that her PCP put her back on Metformin only recently.   Thank you, Lenor Coffin, RN, CNS, Diabetes Coordinator [phone])  Neither Pt nor family want to go home on insulin.  At home, she was only taking 250 mg Metformin and not following diet guidelines.  Spoke with Dr. Arthor Captain regarding lantus at discharge. He will talk with patient and decide if pt will or will not be compliant with insulin. Pt could follow up with PCP, Dr. Concepcion Elk who may be able to convince pt that she would benefit from lantus at 10 units.  Will revisit tomorrow. Thank you, Lenor Coffin, RN, CNS, Diabetes Coordinator [phone])

## 2012-12-15 LAB — GLUCOSE, CAPILLARY: Glucose-Capillary: 130 mg/dL — ABNORMAL HIGH (ref 70–99)

## 2012-12-15 LAB — BASIC METABOLIC PANEL
BUN: <3 mg/dL — ABNORMAL LOW (ref 6–23)
Calcium: 9.5 mg/dL (ref 8.4–10.5)
Creatinine, Ser: 0.51 mg/dL (ref 0.50–1.10)
GFR calc Af Amer: >90 mL/min (ref >90)
GFR calc non Af Amer: >90 mL/min (ref >90)
Glucose, Bld: 98 mg/dL (ref 70–99)
Potassium: 3.5 mEq/L (ref 3.5–5.1)

## 2012-12-15 LAB — CBC
Hemoglobin: 12.3 g/dL (ref 12.0–15.0)
MCH: 28.6 pg (ref 26.0–34.0)
MCHC: 33.1 g/dL (ref 30.0–36.0)
Platelets: 476 10*3/uL — ABNORMAL HIGH (ref 150–400)
RDW: 16.1 % — ABNORMAL HIGH (ref 11.5–15.5)

## 2012-12-15 MED ORDER — SUMATRIPTAN SUCCINATE 50 MG PO TABS: 50.0000 mg | ORAL_TABLET | ORAL | Status: DC | PRN

## 2012-12-15 MED ORDER — METFORMIN HCL 1000 MG PO TABS: 1000.0000 mg | ORAL_TABLET | Freq: Two times a day (BID) | ORAL | Status: DC

## 2012-12-15 MED ORDER — INSULIN PEN STARTER KIT
1.0000 | Freq: Once | Status: DC
  Filled 2012-12-15: qty 1

## 2012-12-15 MED ORDER — METOCLOPRAMIDE HCL 5 MG/ML IJ SOLN
5.0000 mg | Freq: Once | INTRAMUSCULAR | Status: AC
  Administered 2012-12-15: 5 mg via INTRAVENOUS
  Filled 2012-12-15: qty 1

## 2012-12-15 MED ORDER — INSULIN GLARGINE 100 UNITS/ML SOLOSTAR PEN: 10.0000 [IU] | PEN_INJECTOR | Freq: Every day | SUBCUTANEOUS | Status: DC

## 2012-12-15 MED ORDER — SULFAMETHOXAZOLE-TRIMETHOPRIM 800-160 MG PO TABS: 1.0000 | ORAL_TABLET | Freq: Two times a day (BID) | ORAL | Status: DC

## 2012-12-15 MED ORDER — POTASSIUM CHLORIDE CRYS ER 20 MEQ PO TBCR
60.0000 meq | EXTENDED_RELEASE_TABLET | Freq: Once | ORAL | Status: AC
  Administered 2012-12-15: 60 meq via ORAL
  Filled 2012-12-15: qty 3

## 2012-12-15 NOTE — Progress Notes (Signed)
Instructed and demonstrated how to dial insulin pen to 10 units of insulin with practice pen and give injections.   Pt. Could not return demonstration successfully through with two tries.  RN asked pt. Who was picking her up this afternoon or was it someone that could come to be instructed on how to dial insulin pen and give injection.  She stated that her niece will be staying with her for a week and could come after she gets off of work tonight at 7 pm.  Dr. Arthor Captain made aware and call the diabetes coordinator to come and follow up also.  Will continue monitor.  Forbes Cellar, RN

## 2012-12-15 NOTE — Progress Notes (Signed)
Physician Discharge Summary  Roxanna Mcever ONG:[id number] DOB: 10/20/1935 DOA: 12/10/2012  PCP: Dorrene German, MD  Admit date: 12/10/2012 Discharge date: 12/15/2012  Time spent: 40 minutes  Recommendations for Outpatient Follow-up:  1. Followup with primary care physician within one week.  Discharge Diagnoses:  Principal Problem:   Syncope and collapse Active Problems:   Hyperglycemia   Dehydration, moderate   Noncompliance   UTI (lower urinary tract infection)   Chronic steroid use   Rheumatoid arthritis   Discharge Condition: Stable  Diet recommendation: Carbohydrate modified diet.  Filed Weights   12/11/12 0110 12/11/12 2127  Weight: 60.5 kg (133 lb 6.1 oz) 64.6 kg (142 lb 6.7 oz)    History of present illness:  [name] is an 77 y.o. female with hx of long standing DM on oral hyperglycemic agent, HTN on Lisinopril, RA on chronic steroid being tapered, hx of dietary noncompliance, presents to the ER as her CBS has been elevated to 500. She saw her PCP, and was also found to have a UTI, treated with Cipro. While on her way to the PCP, she had a syncope in the taxi. Her PCP had recommended her to be admitted, but she declined. Plan at that time was to see her back in one day, tx with Cipro, and if she is not better, to admit her. In the ER, she was found to have BS of 350, nonketotic, normal renal fx tests, and UA with TNTC WBC. She was given IVF boluses, and hospitalist was asked to admit her for UTI failed outpatient Tx, dehydration, syncope, and hyperglycemia with chronic steroid use.  Hospital Course:   1. MRSA UTI: Upon admission urinalysis was consistent with UTI, patient was empirically placed on Rocephin. The cultures comes back positive for MRSA so antibiotics were switched to vancomycin. The blood cultures were obtained because of usually staph aureus UTI as and seeding from somewhere. Blood cultures stays no growth to date. Patient was treated with vancomycin in  the hospital, on discharge Bactrim DS prescribed for 10 more days. Please note that patient had UTI in 2009. On discharge her white blood count is improving from 14.8-11  2. Syncope: This is likely multifactorial, secondary to UTI, dehydration and volume repletion. But patient also had an episode of severe hypoglycemia might be contributing. Patient lightheadedness resolved, she denies any loss of consciousness at that time. Aggressive hydration with IV fluids was done with normal saline, patient was on telemetry and she had no tachyarrhythmias. MRI of the brain was done and showed no abnormal findings.  3. Hyperglycemia: In patient with type 2 diabetes mellitus, patient has recent steroid taper, she was still on Solu-Medrol on admission. There was no evidence of ketosis or acidosis. Insulin sliding scale was utilized during this hospital stay. Hemoglobin A1c was checked and it was 11.9, this is consistent with mean plasma glucose of 295. Patient placed on 10 units of Lantus insulin each bedtime and her metformin increased to 1 g twice a day.  4. Diabetes was type II: As above, uncontrolled diabetes mellitus type 2, hemoglobin A1c of 11.9, patient placed on Lantus insulin and increased dose of metformin.  5. Headache: Patient complaining about frontal headache, MRI is done is negative for acute events. Patient did receive Imitrex for her headache it did help, prescription for Imitrex was given. Patient reported that she is having frequent headaches at home. Patient examined repeatedly, there is no evidence of meningeal signs.  6. Hypertension: Blood pressure stayed controlled during this  hospital stay. It was elevated in the emergency department. 2-D echocardiogram was done and showed normal LVEF of 55%. 3 sets of troponin were negative. Patient was tachycardic on admission which is likely secondary to volume depletion. This is resolved after IV fluid  hydration.  Procedures:  None  Consultations:  None  Discharge Exam: Filed Vitals:   12/14/12 1436 12/14/12 2151 12/15/12 0500 12/15/12 0955  BP: 138/76 174/98 150/71 118/78  Pulse: 86 97 118   Temp: 99.3 F (37.4 C) 100.2 F (37.9 C) 99.8 F (37.7 C)   TempSrc: Oral  Oral   Resp: 18 18 20    Height:      Weight:      SpO2: 94% 95% 95%    General: Alert and awake, oriented x3, not in any acute distress. HEENT: anicteric sclera, pupils reactive to light and accommodation, EOMI CVS: S1-S2 clear, no murmur rubs or gallops Chest: clear to auscultation bilaterally, no wheezing, rales or rhonchi Abdomen: soft nontender, nondistended, normal bowel sounds, no organomegaly Extremities: no cyanosis, clubbing or edema noted bilaterally Neuro: Cranial nerves II-XII intact, no focal neurological deficits  Discharge Instructions  Discharge Orders   Future Orders Complete By Expires     Diet Carb Modified  As directed     Increase activity slowly  As directed         Medication List    STOP taking these medications       ciprofloxacin 500 MG tablet  Commonly known as:  CIPRO     methylPREDNISolone 4 MG tablet  Commonly known as:  MEDROL      TAKE these medications       albuterol 108 (90 BASE) MCG/ACT inhaler  Commonly known as:  PROVENTIL HFA;VENTOLIN HFA  Inhale 2 puffs into the lungs every 6 (six) hours as needed for wheezing or shortness of breath.     BC HEADACHE POWDER PO  Take 1 packet by mouth daily as needed (for pain).     folic acid 1 MG tablet  Commonly known as:  FOLVITE  Take 1 mg by mouth daily.     insulin glargine 100 units/mL Soln  Commonly known as:  LANTUS  Inject 10 Units into the skin daily at 10 pm.     lisinopril 20 MG tablet  Commonly known as:  PRINIVIL,ZESTRIL  Take 1 tablet (20 mg total) by mouth daily.     metFORMIN 1000 MG tablet  Commonly known as:  GLUCOPHAGE  Take 1 tablet (1,000 mg total) by mouth 2 (two) times daily with a  meal.     promethazine 12.5 MG tablet  Commonly known as:  PHENERGAN  Take 12.5 mg by mouth daily as needed for nausea.     sulfamethoxazole-trimethoprim 800-160 MG per tablet  Commonly known as:  BACTRIM DS  Take 1 tablet by mouth 2 (two) times daily.     SUMAtriptan 50 MG tablet  Commonly known as:  IMITREX  Take 1 tablet (50 mg total) by mouth every 2 (two) hours as needed for migraine.     traMADol 50 MG tablet  Commonly known as:  ULTRAM  Take 50 mg by mouth 2 (two) times daily as needed for pain.       No Known Allergies     Follow-up Information   Follow up with Dorrene German, MD.   Contact information:   [address] [place] [address] [phone]        The results of significant diagnostics from  this hospitalization (including imaging, microbiology, ancillary and laboratory) are listed below for reference.    Significant Diagnostic Studies: Dg Chest 2 View  12/10/2012   *RADIOLOGY REPORT*  Clinical Data: Shortness of breath, hyperglycemia.  CHEST - 2 VIEW  Comparison: 07/12/2012  Findings: Heart is upper limits normal in size. Left lung is clear. No effusions.  No acute bony abnormality.  Prior to level vertebroplasty in the lower thoracic spine.  Linear subsegmental atelectasis in the right lung base.  IMPRESSION: Right base atelectasis.   Original Report Authenticated By: Charlett Nose, M.D.   Mr Brain Wo Contrast  12/13/2012   *RADIOLOGY REPORT*  Clinical Data:  Frontal headache.  Hypertension.  Syncope and collapse.  Hyperglycemia.  Chronic steroid use.  Rheumatoid arthritis.  MRI HEAD WITHOUT CONTRAST  Technique:  Multiplanar, multiecho pulse sequences of the brain and surrounding structures were obtained according to standard protocol without intravenous contrast.  Comparison: None.  Findings: There is no evidence for acute infarction, intracranial hemorrhage, mass lesion, hydrocephalus, or extra-axial fluid.  Mild atrophy.  Periventricular greater  than subcortical white matter signal abnormality, likely chronic microvascular ischemic change. No foci of chronic hemorrhage.  Flow voids are maintained in the major intracranial vessels.  Calvarium and skull base intact.  Mild pannus surrounds the C1-2 articulation consistent with history of rheumatoid arthritis.  No C1-C2 malalignment or cervicomedullary compression.  Paranasal sinuses and orbits unremarkable.  No acute mastoid fluid.  IMPRESSION: Mild age related changes as described.  Mild pannus.  No acute intracranial findings. No acute stroke or hemorrhage.  No evidence for vertebrobasilar insufficiency no acute sinus pathology to suggest cause for the patient's headaches.   Original Report Authenticated By: Davonna Belling, M.D.    Microbiology: Recent Results (from the past 240 hour(s))  URINE CULTURE     Status: None   Collection Time    12/10/12 10:02 PM      Result Value Range Status   Specimen Description URINE, CLEAN CATCH   Final   Special Requests NONE   Final   Culture  Setup Time 12/10/2012 23:08   Final   Colony Count >=100,000 COLONIES/ML   Final   Culture     Final   Value: METHICILLIN RESISTANT STAPHYLOCOCCUS AUREUS     Note: RIFAMPIN AND GENTAMICIN SHOULD NOT BE USED AS SINGLE DRUGS FOR TREATMENT OF STAPH INFECTIONS.   Report Status 12/12/2012 FINAL   Final   Organism ID, Bacteria METHICILLIN RESISTANT STAPHYLOCOCCUS AUREUS   Final  CULTURE, BLOOD (ROUTINE X 2)     Status: None   Collection Time    12/13/12  8:30 AM      Result Value Range Status   Specimen Description BLOOD RIGHT HAND   Final   Special Requests BOTTLES DRAWN AEROBIC AND ANAEROBIC 10CC   Final   Culture  Setup Time 12/13/2012 13:59   Final   Culture     Final   Value:        BLOOD CULTURE RECEIVED NO GROWTH TO DATE CULTURE WILL BE HELD FOR 5 DAYS BEFORE ISSUING A FINAL NEGATIVE REPORT   Report Status PENDING   Incomplete  CULTURE, BLOOD (ROUTINE X 2)     Status: None   Collection Time    12/13/12   8:40 AM      Result Value Range Status   Specimen Description BLOOD LEFT HAND   Final   Special Requests BOTTLES DRAWN AEROBIC AND ANAEROBIC 10CC   Final   Culture  Setup  Time 12/13/2012 13:59   Final   Culture     Final   Value:        BLOOD CULTURE RECEIVED NO GROWTH TO DATE CULTURE WILL BE HELD FOR 5 DAYS BEFORE ISSUING A FINAL NEGATIVE REPORT   Report Status PENDING   Incomplete     Labs: Basic Metabolic Panel:  Recent Labs Lab 12/10/12 2001 12/11/12 0117 12/12/12 0610 12/13/12 0540 12/14/12 0515 12/15/12 0435  NA 134*  --  140 141 141 143  K 3.7  --  3.2* 3.4* 4.1 3.5  CL 93*  --  106 109 108 101  CO2 26  --  22 24 24  36*  GLUCOSE 341*  --  297* 134* 112* 98  BUN 10  --  5* 4* 3* <3*  CREATININE 0.70 0.54 0.45* 0.51 0.46* 0.51  CALCIUM 9.5  --  8.1* 8.1* 8.6 9.5   Liver Function Tests:  Recent Labs Lab 12/10/12 2001  AST 15  ALT 14  ALKPHOS 72  BILITOT 0.4  PROT 7.4  ALBUMIN 3.0*   No results found for this basename: LIPASE, AMYLASE,  in the last 168 hours No results found for this basename: AMMONIA,  in the last 168 hours CBC:  Recent Labs Lab 12/10/12 2001 12/11/12 0117 12/12/12 0610 12/13/12 0540 12/14/12 0515 12/15/12 0435  WBC 12.8* 12.2* 12.1* 14.8* 13.4* 11.1*  NEUTROABS 7.9*  --   --   --   --   --   HGB 14.0 11.6* 10.3* 9.8* 10.4* 12.3  HCT 40.5 34.9* 30.6* 29.5* 31.7* 37.2  MCV 86.7 87.3 85.7 86.3 87.8 86.5  PLT 388 332 331 369 389 476*   Cardiac Enzymes:  Recent Labs Lab 12/11/12 0115 12/11/12 0809 12/11/12 1332  TROPONINI <0.30 <0.30 <0.30   BNP: BNP (last 3 results) No results found for this basename: PROBNP,  in the last 8760 hours CBG:  Recent Labs Lab 12/14/12 1222 12/14/12 1715 12/14/12 2127 12/14/12 2158 12/15/12 0752  GLUCAP 168* 80 126* 122* 130*       Signed:  Khristine Verno A  [facility] 12/15/2012, 11:45 AM

## 2012-12-15 NOTE — Progress Notes (Addendum)
[name] to be D/C'd Home per MD order.  Discussed with the patient and all questions fully answered. Pt does wish to have insulin pen instructions to be taught to niece when she arrives at 7pm.     Medication List    STOP taking these medications       ciprofloxacin 500 MG tablet  Commonly known as:  CIPRO     methylPREDNISolone 4 MG tablet  Commonly known as:  MEDROL      TAKE these medications       albuterol 108 (90 BASE) MCG/ACT inhaler  Commonly known as:  PROVENTIL HFA;VENTOLIN HFA  Inhale 2 puffs into the lungs every 6 (six) hours as needed for wheezing or shortness of breath.     BC HEADACHE POWDER PO  Take 1 packet by mouth daily as needed (for pain).     folic acid 1 MG tablet  Commonly known as:  FOLVITE  Take 1 mg by mouth daily.     insulin glargine 100 units/mL Soln  Commonly known as:  LANTUS  Inject 10 Units into the skin daily at 10 pm.     lisinopril 20 MG tablet  Commonly known as:  PRINIVIL,ZESTRIL  Take 1 tablet (20 mg total) by mouth daily.     metFORMIN 1000 MG tablet  Commonly known as:  GLUCOPHAGE  Take 1 tablet (1,000 mg total) by mouth 2 (two) times daily with a meal.     promethazine 12.5 MG tablet  Commonly known as:  PHENERGAN  Take 12.5 mg by mouth daily as needed for nausea.     sulfamethoxazole-trimethoprim 800-160 MG per tablet  Commonly known as:  BACTRIM DS  Take 1 tablet by mouth 2 (two) times daily.     SUMAtriptan 50 MG tablet  Commonly known as:  IMITREX  Take 1 tablet (50 mg total) by mouth every 2 (two) hours as needed for migraine.     traMADol 50 MG tablet  Commonly known as:  ULTRAM  Take 50 mg by mouth 2 (two) times daily as needed for pain.        VVS, Skin clean, dry and intact without evidence of skin break down, no evidence of skin tears noted. IV catheter discontinued intact. Site without signs and symptoms of complications. Dressing and pressure applied. Tele D/Ced and verified with Central  Monitoring.   An After Visit Summary was printed and will be given to the patient. Patient will be  escorted via WC, and D/C home via private auto on niece's arrival. Will monitor patient until patient 's niece arrives.   Driggers, Rae Roam 12/15/2012 6:28 PM

## 2012-12-15 NOTE — Care Management Note (Addendum)
    Page 1 of 2   12/15/2012     10:28:23 AM   CARE MANAGEMENT NOTE 12/15/2012  Patient:  [name], [name]   Account Number:  [id number]  Date Initiated:  12/15/2012  Documentation initiated by:  Letha Cape  Subjective/Objective Assessment:   dx syncope, uti, ha  admit- lives alone, uses a walker at home.     Action/Plan:   HHRN   Anticipated DC Date:  12/15/2012   Anticipated DC Plan:  HOME W HOME HEALTH SERVICES      DC Planning Services  CM consult      [facility] Choice  HOME HEALTH   Choice offered to / List presented to:  C-1 Patient        HH arranged  HH-1 RN      [facility] agency  [facility]   Status of service:  Completed, signed off Medicare Important Message given?   (If response is "NO", the following Medicare IM given date fields will be blank) Date Medicare IM given:   Date Additional Medicare IM given:    Discharge Disposition:  HOME W HOME HEALTH SERVICES  Per UR Regulation:  Reviewed for med. necessity/level of care/duration of stay  If discussed at Long Length of Stay Meetings, dates discussed:    Comments:  12/15/12 10:18 Letha Cape RN, BSN [phone] patient lives alone, will be new at starting insulin, will need medication mangagement, HHRN.  Patient chose [facility] from agency list.  Referral made to [facility], Lupita Leash notified. Patient states that she was at the hospital the last time and somone set her up with [facility] and no one showed up.  I asked [facility] if they had patient previously , they did but did not get to see the patient, not sure why but patient went home from the  ED per [facility] rep, not sure if patient was not home when they went by etc.  Patient uses a walker at home. she ambulates good.  Patient has transportation and medicaid for her medications.

## 2012-12-15 NOTE — Progress Notes (Signed)
Discharge instructions discussed with her niece  & verbalized understanding & able to demonstrate how to give Insulin pen .Discharged pt.home via wheelchair in fair condition accompanied by niece .

## 2012-12-15 NOTE — Progress Notes (Signed)
Pt so kind, gracious and accepting.  Pt able to demonstrate how to use the insulin pen, and how to prevent hypoglycemia.   Niece will be here this evening and pt wants her to learn how to use as well. Gave pt my card with contact information for any concerns after discharge.  Emphasized to her that if her fasting glucose values are less than 80-100 mg/dL to notify MD for possibly not taking the lantus and only the Metformin.  If on steroid therapy at home, told her to expect her glucose to rise and will definitely need her lantus. Added carb modified to existing diet orders [place] Mam

## 2012-12-19 LAB — CULTURE, BLOOD (ROUTINE X 2)
Culture: NO GROWTH
Culture: NO GROWTH

## 2012-12-31 NOTE — Discharge Summary (Signed)
Addendum to the D/C summary done on 12/15/2012  Hospital course: Moderate malnutrition: per RD notes has 8 lbs loss in the past month and poor oral intake, RD consulted and advised the patient about the recommended diet.  Clint Lipps Pager: [phone] 12/31/2012, 6:51 PM

## 2014-04-07 ENCOUNTER — Emergency Department (HOSPITAL_COMMUNITY): Payer: PRIVATE HEALTH INSURANCE

## 2014-04-07 ENCOUNTER — Encounter (HOSPITAL_COMMUNITY): Payer: Self-pay | Admitting: Emergency Medicine

## 2014-04-07 ENCOUNTER — Observation Stay (HOSPITAL_COMMUNITY)
Admission: EM | Admit: 2014-04-07 | Discharge: 2014-04-09 | Disposition: A | Discharge status: Home / Self Care | Payer: PRIVATE HEALTH INSURANCE | Attending: Internal Medicine | Admitting: Internal Medicine

## 2014-04-07 DIAGNOSIS — M129 Arthropathy, unspecified: Secondary | ICD-10-CM | POA: Diagnosis not present

## 2014-04-07 DIAGNOSIS — Z87891 Personal history of nicotine dependence: Secondary | ICD-10-CM | POA: Diagnosis not present

## 2014-04-07 DIAGNOSIS — R55 Syncope and collapse: Secondary | ICD-10-CM | POA: Diagnosis not present

## 2014-04-07 DIAGNOSIS — I209 Angina pectoris, unspecified: Secondary | ICD-10-CM | POA: Insufficient documentation

## 2014-04-07 DIAGNOSIS — Z79899 Other long term (current) drug therapy: Secondary | ICD-10-CM | POA: Diagnosis not present

## 2014-04-07 DIAGNOSIS — Z23 Encounter for immunization: Secondary | ICD-10-CM | POA: Diagnosis not present

## 2014-04-07 DIAGNOSIS — J42 Unspecified chronic bronchitis: Secondary | ICD-10-CM | POA: Diagnosis not present

## 2014-04-07 DIAGNOSIS — R739 Hyperglycemia, unspecified: Secondary | ICD-10-CM

## 2014-04-07 DIAGNOSIS — R0682 Tachypnea, not elsewhere classified: Secondary | ICD-10-CM | POA: Diagnosis not present

## 2014-04-07 DIAGNOSIS — R5381 Other malaise: Secondary | ICD-10-CM | POA: Diagnosis present

## 2014-04-07 DIAGNOSIS — R42 Dizziness and giddiness: Secondary | ICD-10-CM | POA: Diagnosis not present

## 2014-04-07 DIAGNOSIS — E119 Type 2 diabetes mellitus without complications: Secondary | ICD-10-CM | POA: Diagnosis not present

## 2014-04-07 DIAGNOSIS — R000 Tachycardia, unspecified: Secondary | ICD-10-CM | POA: Diagnosis present

## 2014-04-07 DIAGNOSIS — R5383 Other fatigue: Secondary | ICD-10-CM

## 2014-04-07 DIAGNOSIS — M069 Rheumatoid arthritis, unspecified: Secondary | ICD-10-CM | POA: Diagnosis present

## 2014-04-07 DIAGNOSIS — I10 Essential (primary) hypertension: Secondary | ICD-10-CM | POA: Insufficient documentation

## 2014-04-07 DIAGNOSIS — R079 Chest pain, unspecified: Principal | ICD-10-CM | POA: Insufficient documentation

## 2014-04-07 DIAGNOSIS — Z794 Long term (current) use of insulin: Secondary | ICD-10-CM | POA: Diagnosis not present

## 2014-04-07 HISTORY — DX: Gastro-esophageal reflux disease without esophagitis: K21.9

## 2014-04-07 HISTORY — DX: Pure hypercholesterolemia, unspecified: E78.00

## 2014-04-07 HISTORY — DX: Chest pain, unspecified: R07.9

## 2014-04-07 LAB — URINALYSIS, ROUTINE W REFLEX MICROSCOPIC
Bilirubin Urine: NEGATIVE
Glucose, UA: NEGATIVE mg/dL
Hgb urine dipstick: NEGATIVE
KETONES UR: NEGATIVE mg/dL
NITRITE: NEGATIVE
PH: 5.5 (ref 5.0–8.0)
Protein, ur: NEGATIVE mg/dL
Specific Gravity, Urine: 1.023 (ref 1.005–1.030)
Urobilinogen, UA: 0.2 mg/dL (ref 0.0–1.0)

## 2014-04-07 LAB — BASIC METABOLIC PANEL
ANION GAP: 15 (ref 5–15)
BUN: 15 mg/dL (ref 6–23)
CALCIUM: 12 mg/dL — AB (ref 8.4–10.5)
CHLORIDE: 99 meq/L (ref 96–112)
CO2: 28 meq/L (ref 19–32)
Creatinine, Ser: 0.93 mg/dL (ref 0.50–1.10)
GFR calc Af Amer: 67 mL/min — ABNORMAL LOW (ref >90)
GFR calc non Af Amer: 58 mL/min — ABNORMAL LOW (ref >90)
Glucose, Bld: 174 mg/dL — ABNORMAL HIGH (ref 70–99)
Potassium: 4.4 mEq/L (ref 3.7–5.3)
SODIUM: 142 meq/L (ref 137–147)

## 2014-04-07 LAB — PRO B NATRIURETIC PEPTIDE: PRO B NATRI PEPTIDE: 63.8 pg/mL (ref 0–450)

## 2014-04-07 LAB — GLUCOSE, CAPILLARY: GLUCOSE-CAPILLARY: 171 mg/dL — AB (ref 70–99)

## 2014-04-07 LAB — CBG MONITORING, ED: GLUCOSE-CAPILLARY: 181 mg/dL — AB (ref 70–99)

## 2014-04-07 LAB — URINE MICROSCOPIC-ADD ON

## 2014-04-07 LAB — CBC
HCT: 40.8 % (ref 36.0–46.0)
Hemoglobin: 13.5 g/dL (ref 12.0–15.0)
MCH: 30.9 pg (ref 26.0–34.0)
MCHC: 33.1 g/dL (ref 30.0–36.0)
MCV: 93.4 fL (ref 78.0–100.0)
PLATELETS: 285 10*3/uL (ref 150–400)
RBC: 4.37 MIL/uL (ref 3.87–5.11)
RDW: 14.8 % (ref 11.5–15.5)
WBC: 8.8 10*3/uL (ref 4.0–10.5)

## 2014-04-07 LAB — POCT I-STAT TROPONIN I: Troponin i, poc: 0.01 ng/mL (ref 0.00–0.08)

## 2014-04-07 LAB — TROPONIN I: Troponin I: <0.3 ng/mL (ref <0.30)

## 2014-04-07 LAB — I-STAT TROPONIN, ED: Troponin i, poc: 0 ng/mL (ref 0.00–0.08)

## 2014-04-07 LAB — MRSA PCR SCREENING: MRSA BY PCR: NEGATIVE

## 2014-04-07 LAB — TSH: TSH: 0.547 u[IU]/mL (ref 0.350–4.500)

## 2014-04-07 MED ORDER — LEVALBUTEROL HCL 1.25 MG/0.5ML IN NEBU
1.2500 mg | INHALATION_SOLUTION | Freq: Four times a day (QID) | RESPIRATORY_TRACT | Status: DC | PRN
  Filled 2014-04-07: qty 0.5

## 2014-04-07 MED ORDER — LISINOPRIL 20 MG PO TABS
20.0000 mg | ORAL_TABLET | Freq: Every day | ORAL | Status: DC
  Administered 2014-04-08 – 2014-04-09 (×2): 20 mg via ORAL
  Filled 2014-04-07 (×2): qty 1

## 2014-04-07 MED ORDER — FOLIC ACID 1 MG PO TABS
1.0000 mg | ORAL_TABLET | Freq: Every day | ORAL | Status: DC
  Administered 2014-04-08 – 2014-04-09 (×2): 1 mg via ORAL
  Filled 2014-04-07 (×2): qty 1

## 2014-04-07 MED ORDER — INSULIN ASPART 100 UNIT/ML ~~LOC~~ SOLN
0.0000 [IU] | Freq: Three times a day (TID) | SUBCUTANEOUS | Status: DC
  Administered 2014-04-08: 2 [IU] via SUBCUTANEOUS
  Administered 2014-04-09: 1 [IU] via SUBCUTANEOUS

## 2014-04-07 MED ORDER — ENOXAPARIN SODIUM 40 MG/0.4ML ~~LOC~~ SOLN
40.0000 mg | SUBCUTANEOUS | Status: DC
  Administered 2014-04-07 – 2014-04-09 (×2): 40 mg via SUBCUTANEOUS
  Filled 2014-04-07 (×3): qty 0.4

## 2014-04-07 MED ORDER — IOHEXOL 350 MG/ML SOLN
100.0000 mL | Freq: Once | INTRAVENOUS | Status: AC | PRN
  Administered 2014-04-07: 100 mL via INTRAVENOUS

## 2014-04-07 MED ORDER — INSULIN GLARGINE 100 UNITS/ML SOLOSTAR PEN: 10.0000 [IU] | PEN_INJECTOR | Freq: Every day | SUBCUTANEOUS | Status: DC

## 2014-04-07 MED ORDER — ONDANSETRON HCL 4 MG/2ML IJ SOLN: 4.0000 mg | Freq: Four times a day (QID) | INTRAMUSCULAR | Status: DC | PRN

## 2014-04-07 MED ORDER — METOPROLOL TARTRATE 1 MG/ML IV SOLN: 5.0000 mg | Freq: Four times a day (QID) | INTRAVENOUS | Status: DC | PRN

## 2014-04-07 MED ORDER — SODIUM CHLORIDE 0.9 % IV SOLN
INTRAVENOUS | Status: AC
  Administered 2014-04-07: 21:00:00 via INTRAVENOUS

## 2014-04-07 MED ORDER — ASPIRIN 81 MG PO CHEW: 324.0000 mg | CHEWABLE_TABLET | Freq: Once | ORAL | Status: DC

## 2014-04-07 MED ORDER — INSULIN GLARGINE 100 UNIT/ML ~~LOC~~ SOLN
10.0000 [IU] | Freq: Every day | SUBCUTANEOUS | Status: DC
  Administered 2014-04-07 – 2014-04-09 (×2): 10 [IU] via SUBCUTANEOUS
  Filled 2014-04-07 (×4): qty 0.1

## 2014-04-07 MED ORDER — INFLUENZA VAC SPLIT QUAD 0.5 ML IM SUSY
0.5000 mL | PREFILLED_SYRINGE | INTRAMUSCULAR | Status: AC
  Administered 2014-04-08: 0.5 mL via INTRAMUSCULAR
  Filled 2014-04-07: qty 0.5

## 2014-04-07 MED ORDER — SODIUM CHLORIDE 0.9 % IJ SOLN
3.0000 mL | Freq: Two times a day (BID) | INTRAMUSCULAR | Status: DC
  Administered 2014-04-07 – 2014-04-09 (×2): 3 mL via INTRAVENOUS

## 2014-04-07 MED ORDER — ALBUTEROL SULFATE (2.5 MG/3ML) 0.083% IN NEBU: 2.5000 mg | INHALATION_SOLUTION | RESPIRATORY_TRACT | Status: DC | PRN

## 2014-04-07 MED ORDER — PROMETHAZINE HCL 12.5 MG PO TABS: 12.5000 mg | ORAL_TABLET | ORAL | Status: DC | PRN

## 2014-04-07 MED ORDER — ONDANSETRON HCL 4 MG PO TABS: 4.0000 mg | ORAL_TABLET | Freq: Four times a day (QID) | ORAL | Status: DC | PRN

## 2014-04-07 MED ORDER — ASPIRIN 81 MG PO CHEW
324.0000 mg | CHEWABLE_TABLET | Freq: Once | ORAL | Status: AC
  Administered 2014-04-07: 324 mg via ORAL
  Filled 2014-04-07: qty 4

## 2014-04-07 MED ORDER — TRAMADOL HCL 50 MG PO TABS
50.0000 mg | ORAL_TABLET | Freq: Four times a day (QID) | ORAL | Status: DC | PRN
  Administered 2014-04-08 – 2014-04-09 (×3): 50 mg via ORAL
  Filled 2014-04-07 (×3): qty 1

## 2014-04-07 NOTE — ED Notes (Signed)
Attempted to start IV, unsuccessful, second nurse attempted, IV team notified

## 2014-04-07 NOTE — ED Provider Notes (Signed)
5:30 PM  I was asked by nursing staff to evaluate patient because of development of chest pain. She is still hemodynamically stable. EKG shows left anterior fascicular block that is similar to her prior EKG performed earlier today. We'll obtain troponin. Her CT of her chest today was performed rule out pulmonary embolus. No sign of edema or infiltrate. Patient reports that she has had similar chest pain for several years. She describes as sharp and central and only lasted several seconds and is now completely gone. No shortness of breath, nausea, vomiting, diaphoresis or dizziness. She received aspirin earlier today. Doubt that this is cardiac in nature. Hospitalist is aware.  [place], DO 04/07/14 1750

## 2014-04-07 NOTE — H&P (Addendum)
[facility] History and Physical  [name] DZH:[id number] DOB: 07-Feb-1936 DOA: 04/07/2014  Referring physician: ED physician PCP: [name], MD   Chief Complaint: dizziness and chest pain   HPI:  Pt is very pleasant 78 yo female with HTN, HLD, DM, who presented to [facility] ED with main concern of several days duration of intermittent episodes of lightheadedness, weakness, unsteady gait. Pt explains these episodes are intermittently associated with mid area chest pain, each episode lasting several minutes and resolving on its own, non radiating, no specific alleviating or aggravating factors. Earlier this AM, PTA, she felt as she was going to fall but never did.She currently denies any chest pain or shortness of breath and says chest pain occurs mostly with moving and deep breathing.  In ED, pt noted to be hemodynamically stable, blood work including CBC and BMP unremarkable. CXR with no specific acute cardiopulmonary findings. TRH asked to admit to telemetry bed for further evaluation.   Assessment and Plan: Active Problems:   Chest pain - unclear etiology - admit to telemetry bed for further evaluation - no signs of a-fib on 12 lead but pt noted to be tachycardic  - cycle CEs, check TSH, repeat 12 lead EKG if pt experiences chest pain - consider cardio consult in AM if pain still present  - provide analgesia and oxygen as needed   Pre syncope - unclear if related to chest pain episodes - monitor on telemetry  - check orthostatic vitals - CE's and repeat 12 lead EKG if pt reports similar event - PT evaluation   Tachycardia - 12 lead EKG with NSR at 98 bpm, HR currently in 110 -120's - will monitor closely - provide metoprolol as needed    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     Ct Angio Chest Pe W/cm &/or Wo Cm  04/07/2014  Study negative for pulmonary emboli.  Hepatic steatosis.  Small hiatal hernia.   Code Status: Full Family Communication: Pt and family at bedside Disposition Plan: Admit for further evaluation     Review of Systems:  Constitutional: Negative for diaphoresis.  HENT: Negative for hearing loss, ear pain, nosebleeds, congestion, sore throat, neck pain, tinnitus and ear discharge.   Eyes: Negative for blurred vision, double vision, photophobia, pain, discharge and redness.  Respiratory: Negative for cough, hemoptysis, wheezing and stridor.   Cardiovascular: Negative for claudication and leg swelling.  Gastrointestinal: Negative for nausea, vomiting and abdominal pain.  Genitourinary: Negative for dysuria, urgency, frequency, hematuria and flank pain.  Musculoskeletal: Negative for myalgias, back pain, joint pain and falls.  Skin: Negative for itching and rash.  Neurological: Negative for tingling, tremors, sensory change, speech change, headaches.   Endo/Heme/Allergies: Negative for environmental allergies and polydipsia. Does not bruise/bleed easily.  Psychiatric/Behavioral: Negative for suicidal ideas. The patient is not nervous/anxious.      Past Medical History  Diagnosis Date  . Hypertension   . Type II diabetes mellitus   . Chronic bronchitis     "I've had it alot of times" (12/11/2012)  . Anginal pain   . Exertional shortness of breath   . Headache(784.0)     "once or twice/month" (12/11/2012)  . Arthritis     "all over  my body" (12/11/2012)    Past Surgical History  Procedure Laterality Date  . Back surgery    . Cholecystectomy  1990's  . Appendectomy  1969  . Lumbar disc surgery      "slipped disc" (12/11/2012)  . Shoulder surgery Left ~ 2009    "put cement down in it" (12/11/2012)  . Vaginal hysterectomy  ~ 1973  . Ectopic pregnancy surgery  1969     Social History:  reports that she has quit smoking. Her smoking use included Cigarettes. She smoked 0.00 packs per day. She has never used smokeless tobacco. She reports that she does not drink alcohol or use illicit drugs.  No Known Allergies  No significant family medical history   Prior to Admission medications   Medication Sig Start Date End Date Taking? Authorizing Provider  albuterol (PROVENTIL HFA;VENTOLIN HFA) 108 (90 BASE) MCG/ACT inhaler Inhale 2 puffs into the lungs every 6 (six) hours as needed for wheezing or shortness of breath.   Yes Historical Provider, MD  Aspirin-Salicylamide-Caffeine (BC HEADACHE POWDER PO) Take 1 packet by mouth daily as needed (for pain).    Yes Historical Provider, MD  folic acid (FOLVITE) 1 MG tablet Take 1 mg by mouth daily.   Yes Historical Provider, MD  insulin glargine (LANTUS) 100 units/mL SOLN Inject 10 Units into the skin daily at 10 pm. 12/15/12  Yes Verlee Monte, MD  lisinopril (PRINIVIL,ZESTRIL) 20 MG tablet Take 1 tablet (20 mg total) by mouth daily. 05/04/12  Yes Barbara Cower, MD  metFORMIN (GLUCOPHAGE) 1000 MG tablet Take 1 tablet (1,000 mg total) by mouth 2 (two) times daily with a meal. 12/15/12  Yes Verlee Monte, MD  promethazine (PHENERGAN) 12.5 MG tablet Take 12.5 mg by mouth daily as needed for nausea.   Yes Historical Provider, MD  SUMAtriptan (IMITREX) 50 MG tablet Take 1 tablet (50 mg total) by mouth every 2 (two) hours as needed for migraine. 12/15/12  Yes Verlee Monte, MD  traMADol (ULTRAM) 50 MG tablet Take 50 mg by mouth 2 (two) times daily as needed for pain.  05/04/12  Yes Barbara Cower, MD    Physical Exam: Filed Vitals:   04/07/14 1836 04/07/14 1837 04/07/14 1838 04/07/14 1841  BP: 111/81 131/88 135/77 164/99  Pulse: 104 114 122 126  Temp: 97.9 F (36.6 C)     TempSrc: Oral     Resp:      Height:      Weight:      SpO2: 100%       Physical Exam  Constitutional: Appears well-developed and well-nourished.  No distress.  HENT: Normocephalic. External right and left ear normal. Dry MM Eyes: Conjunctivae and EOM are normal. PERRLA, no scleral icterus.  Neck: Normal ROM. Neck supple. No JVD. No tracheal deviation. No thyromegaly.  CVS: Regular rhythm, tachycardic, no gallops, no carotid bruit.  Pulmonary: Effort and breath sounds normal, no stridor, rhonchi, wheezes, rales.  Abdominal: Soft. BS +,  no distension, tenderness, rebound or guarding.  Musculoskeletal: Normal range of motion. No edema and no tenderness.  Lymphadenopathy: No lymphadenopathy noted, cervical, inguinal. Neuro: Alert. Normal reflexes, muscle tone coordination. No cranial nerve deficit. Skin: Skin is warm and dry. No rash noted. Not diaphoretic. No erythema. No pallor.  Psychiatric: Normal mood and affect.   Labs on Admission:  Basic Metabolic Panel:  Recent Labs Lab 04/07/14 1130  NA 142  K 4.4  CL 99  CO2 28  GLUCOSE 174*  BUN 15  CREATININE 0.93  CALCIUM 12.0*   CBC:  Recent Labs Lab 04/07/14 1130  WBC 8.8  HGB 13.5  HCT 40.8  MCV 93.4  PLT 285   CBG:  Recent Labs Lab 04/07/14 1136  GLUCAP 181*    EKG: Normal sinus rhythm, no ST/T wave changes  Faye Ramsay, MD  [facility] Pager [phone]  If 7PM-7AM, please contact night-coverage [email or web address] Password [email or web address] 04/07/2014, 7:22 PM

## 2014-04-07 NOTE — ED Notes (Signed)
Ready to transport patient upstairs and patient started complaining on CP in middle chest, ED MD aware, paged hospitalist, EKG obtained, and repeat troponin.

## 2014-04-07 NOTE — ED Notes (Signed)
CT notified #20 L forearm for angio

## 2014-04-07 NOTE — ED Notes (Signed)
CBG 181.  

## 2014-04-07 NOTE — ED Provider Notes (Signed)
CSN: [id number]     Arrival date & time 04/07/14  1052 History   First MD Initiated Contact with Patient 04/07/14 1116     Chief Complaint  Patient presents with  . Fatigue     (Consider location/radiation/quality/duration/timing/severity/associated sxs/prior Treatment) Patient is a 78 y.o. female presenting with dizziness.  Dizziness Quality:  Lightheadedness Severity:  Moderate Onset quality:  Gradual Duration:  5 days Timing:  Constant Progression:  Unchanged Chronicity:  New Context: standing up   Relieved by:  Lying down Worsened by:  Sitting upright and standing up Ineffective treatments:  None tried Associated symptoms: chest pain and shortness of breath   Associated symptoms comment:  Diaphoresis, nausea without vomiting   Past Medical History  Diagnosis Date  . Hypertension   . Type II diabetes mellitus   . Chronic bronchitis     "I've had it alot of times" (12/11/2012)  . Anginal pain   . Exertional shortness of breath   . Headache(784.0)     "once or twice/month" (12/11/2012)  . Arthritis     "all over my body" (12/11/2012)   Past Surgical History  Procedure Laterality Date  . Back surgery    . Cholecystectomy  1990's  . Appendectomy  1969  . Lumbar disc surgery      "slipped disc" (12/11/2012)  . Shoulder surgery Left ~ 2009    "put cement down in it" (12/11/2012)  . Vaginal hysterectomy  ~ 1973  . Ectopic pregnancy surgery  1969   No family history on file. History  Substance Use Topics  . Smoking status: Former Smoker    Types: Cigarettes  . Smokeless tobacco: Never Used     Comment: 12/11/2012 "1 pack of cigarettes would last 3-4 months; haven't had any cigarettes for 40 years"  . Alcohol Use: No   OB History   Grav Para Term Preterm Abortions TAB SAB Ect Mult Living                 Review of Systems  Respiratory: Positive for shortness of breath.   Cardiovascular: Positive for chest pain.  Neurological: Positive for dizziness.  All other  systems reviewed and are negative.     Allergies  Review of patient's allergies indicates no known allergies.  Home Medications   Prior to Admission medications   Medication Sig Start Date End Date Taking? Authorizing Provider  albuterol (PROVENTIL HFA;VENTOLIN HFA) 108 (90 BASE) MCG/ACT inhaler Inhale 2 puffs into the lungs every 6 (six) hours as needed for wheezing or shortness of breath.   Yes Historical Provider, MD  Aspirin-Salicylamide-Caffeine (BC HEADACHE POWDER PO) Take 1 packet by mouth daily as needed (for pain).    Yes Historical Provider, MD  folic acid (FOLVITE) 1 MG tablet Take 1 mg by mouth daily.   Yes Historical Provider, MD  insulin glargine (LANTUS) 100 units/mL SOLN Inject 10 Units into the skin daily at 10 pm. 12/15/12  Yes Verlee Monte, MD  lisinopril (PRINIVIL,ZESTRIL) 20 MG tablet Take 1 tablet (20 mg total) by mouth daily. 05/04/12  Yes Barbara Cower, MD  metFORMIN (GLUCOPHAGE) 1000 MG tablet Take 1 tablet (1,000 mg total) by mouth 2 (two) times daily with a meal. 12/15/12  Yes Verlee Monte, MD  promethazine (PHENERGAN) 12.5 MG tablet Take 12.5 mg by mouth daily as needed for nausea.   Yes Historical Provider, MD  SUMAtriptan (IMITREX) 50 MG tablet Take 1 tablet (50 mg total) by mouth every 2 (two) hours as needed for migraine.  12/15/12  Yes Verlee Monte, MD  traMADol (ULTRAM) 50 MG tablet Take 50 mg by mouth 2 (two) times daily as needed for pain.  05/04/12  Yes Barbara Cower, MD   BP 106/60  Pulse 99  Temp(Src) 97.9 F (36.6 C) (Oral)  Resp 16  Ht 5\' 1"  (1.549 m)  Wt 142 lb (64.411 kg)  BMI 26.84 kg/m2  SpO2 93% Physical Exam  Nursing note and vitals reviewed. Constitutional: She is oriented to person, place, and time. She appears well-developed and well-nourished. No distress.  HENT:  Head: Normocephalic and atraumatic.  Mouth/Throat: Oropharynx is clear and moist.  Eyes: Conjunctivae are normal. Pupils are equal, round, and reactive to light. No  scleral icterus.  Neck: Neck supple.  Cardiovascular: Normal rate, regular rhythm, normal heart sounds and intact distal pulses.   No murmur heard. Pulmonary/Chest: No stridor. Tachypnea noted. No respiratory distress. She has rales (bibasilar).  Abdominal: Soft. Bowel sounds are normal. She exhibits no distension. There is no tenderness.  Musculoskeletal: Normal range of motion. She exhibits no edema.  Neurological: She is alert and oriented to person, place, and time.  Skin: Skin is warm and dry. No rash noted.  Psychiatric: She has a normal mood and affect. Her behavior is normal.    ED Course  Procedures (including critical care time) Labs Review Labs Reviewed  BASIC METABOLIC PANEL - Abnormal; Notable for the following:    Glucose, Bld 174 (*)    Calcium 12.0 (*)    GFR calc non Af Amer 58 (*)    GFR calc Af Amer 67 (*)    All other components within normal limits  URINALYSIS, ROUTINE W REFLEX MICROSCOPIC - Abnormal; Notable for the following:    Leukocytes, UA TRACE (*)    All other components within normal limits  URINE MICROSCOPIC-ADD ON - Abnormal; Notable for the following:    Squamous Epithelial / LPF FEW (*)    Casts HYALINE CASTS (*)    All other components within normal limits  CBG MONITORING, ED - Abnormal; Notable for the following:    Glucose-Capillary 181 (*)    All other components within normal limits  CBC  PRO B NATRIURETIC PEPTIDE  I-STAT TROPOININ, ED    Imaging Review Dg Chest 2 View  04/07/2014   CLINICAL DATA:  Chest pain.  Dizziness.  EXAM: CHEST  2 VIEW  COMPARISON:  12/10/2012  FINDINGS: The heart size is normal. There is slight atelectasis or scarring at the right lung base. There are vascular calcifications in the right midlung zone, stable. Lungs are otherwise clear. No effusions. Old compression fractures of T11 and T12 treated with vertebroplasty. Arthritic changes of both shoulders. Slight reversal of the lower thoracic kyphosis.  IMPRESSION:  Slight atelectasis/ scarring at the right lung base.   Electronically Signed   By: Rozetta Nunnery M.D.   On: 04/07/2014 13:19   Ct Angio Chest Pe W/cm &/or Wo Cm  04/07/2014   CLINICAL DATA:  78 year old female with a history of shortness of breath and chest pain.  EXAM: CT ANGIOGRAPHY CHEST WITH CONTRAST  TECHNIQUE: Multidetector CT imaging of the chest was performed using the standard protocol during bolus administration of intravenous contrast. Multiplanar CT image reconstructions and MIPs were obtained to evaluate the vascular anatomy.  CONTRAST:  11mL OMNIPAQUE IOHEXOL 350 MG/ML SOLN  COMPARISON:  Multiple prior comparison, with most recent chest x-ray of the same date. Previous CT chest 02/02/2009  FINDINGS: Nonvascular:  Unremarkable appearance of visualized  chest wall soft tissues. No supraclavicular or axillary adenopathy.  No displaced fracture identified. Mild degenerative changes of the visualized thoracic spine.  Unremarkable appearance of visualized thyroid and thoracic inlet.  Central airways are patent.  Mixed ground-glass and linear opacities of the dependent lung bases most compatible with mild atelectasis. No confluent airspace disease. No pneumothorax or pleural effusion. No visualized lung nodules.  No mediastinal adenopathy. Unremarkable appearance of the esophagus. Small hiatal hernia.  Heart size within normal limits.  No pericardial fluid/thickening.  Upper abdomen:  Fatty infiltration of the liver.  Re- demonstration of gastric, with increasing calcifications.  Partially visualized surgical changes of prior cholecystectomy.  Vascular:  Aorta:  No aneurysmal dilation. No dissection flap. No periaortic fluid. No significant calcifications. Unremarkable appearance of the branch vessels which remain patent.  Pulmonary:  Main pulmonary artery not enlarged. No visualized central, lobar, segmental, or proximal subsegmental filling defects.  Review of the MIP images confirms the above findings.   IMPRESSION: Study negative for pulmonary emboli.  Hepatic steatosis.  Small hiatal hernia.  Signed,  Dulcy Fanny. Earleen Newport, DO  [facility]  [facility]   Electronically Signed   By: Corrie Mckusick O.D.   On: 04/07/2014 16:20  All radiology studies independently viewed by me.      EKG Interpretation   Date/Time:  Thursday April 07 2014 11:08:26 EDT Ventricular Rate:  103 PR Interval:  132 QRS Duration: 81 QT Interval:  326 QTC Calculation: 427 R Axis:   -43 Text Interpretation:  Sinus tachycardia Left axis deviation compared to  prior,  axis more leftward Confirmed by [facility]  MD, TREY ([id number]) on  04/07/2014 12:47:44 PM      MDM   Final diagnoses:  Chest pain, unspecified chest pain type  Near syncope    78 yo female with 5 days of lightheadedness and intermittent CP with SOB.  Saw her PCP today who referred her to ED.    4:49 PM labs, EKG, and CT PE study are unremarkable. Plan hospitalist admission for further workup of near syncope and chest pain.  Houston Siren III, MD 04/07/14 [phone]

## 2014-04-07 NOTE — ED Notes (Signed)
Spoke with admitting MD, verbal order to give nitro and morphine for pain, ED MD went to see pt and pt states that she is no longer having pain and that it only lasted three seconds and this pain has been going on for years.

## 2014-04-07 NOTE — ED Notes (Signed)
Pt in from [place] [facility] via [facility], per report pt was ST HR 110-120, pt orthostatic BP 120 with laying & 100 with standing, pt c/o decreased oral intake x 8 days ago, denies n/v/d, pt A&O x4, follows commands, speaks in complete sentences,

## 2014-04-07 NOTE — ED Notes (Signed)
Unable to gain 2nd IV access for CT angio, IV team at bedside

## 2014-04-08 ENCOUNTER — Observation Stay (HOSPITAL_COMMUNITY): Payer: PRIVATE HEALTH INSURANCE

## 2014-04-08 DIAGNOSIS — R000 Tachycardia, unspecified: Secondary | ICD-10-CM

## 2014-04-08 DIAGNOSIS — R55 Syncope and collapse: Secondary | ICD-10-CM | POA: Diagnosis not present

## 2014-04-08 DIAGNOSIS — I059 Rheumatic mitral valve disease, unspecified: Secondary | ICD-10-CM

## 2014-04-08 DIAGNOSIS — R5383 Other fatigue: Secondary | ICD-10-CM | POA: Diagnosis not present

## 2014-04-08 DIAGNOSIS — E119 Type 2 diabetes mellitus without complications: Secondary | ICD-10-CM | POA: Diagnosis present

## 2014-04-08 DIAGNOSIS — R079 Chest pain, unspecified: Secondary | ICD-10-CM | POA: Diagnosis not present

## 2014-04-08 DIAGNOSIS — R5381 Other malaise: Secondary | ICD-10-CM | POA: Diagnosis not present

## 2014-04-08 DIAGNOSIS — I10 Essential (primary) hypertension: Secondary | ICD-10-CM | POA: Diagnosis not present

## 2014-04-08 HISTORY — DX: Tachycardia, unspecified: R00.0

## 2014-04-08 LAB — GLUCOSE, CAPILLARY
GLUCOSE-CAPILLARY: 119 mg/dL — AB (ref 70–99)
GLUCOSE-CAPILLARY: 178 mg/dL — AB (ref 70–99)
GLUCOSE-CAPILLARY: 148 mg/dL — AB (ref 70–99)
Glucose-Capillary: 98 mg/dL (ref 70–99)

## 2014-04-08 LAB — CORTISOL: Cortisol, Plasma: 9.8 ug/dL

## 2014-04-08 LAB — BASIC METABOLIC PANEL
ANION GAP: 12 (ref 5–15)
BUN: 12 mg/dL (ref 6–23)
CALCIUM: 9.8 mg/dL (ref 8.4–10.5)
CO2: 31 mEq/L (ref 19–32)
Chloride: 102 mEq/L (ref 96–112)
Creatinine, Ser: 0.77 mg/dL (ref 0.50–1.10)
GFR calc Af Amer: >90 mL/min (ref >90)
GFR calc non Af Amer: 80 mL/min — ABNORMAL LOW (ref >90)
GLUCOSE: 187 mg/dL — AB (ref 70–99)
POTASSIUM: 4.1 meq/L (ref 3.7–5.3)
SODIUM: 145 meq/L (ref 137–147)

## 2014-04-08 LAB — CBC
HCT: 38.5 % (ref 36.0–46.0)
Hemoglobin: 12.4 g/dL (ref 12.0–15.0)
MCH: 30.5 pg (ref 26.0–34.0)
MCHC: 32.2 g/dL (ref 30.0–36.0)
MCV: 94.6 fL (ref 78.0–100.0)
PLATELETS: 265 10*3/uL (ref 150–400)
RBC: 4.07 MIL/uL (ref 3.87–5.11)
RDW: 15 % (ref 11.5–15.5)
WBC: 10.4 10*3/uL (ref 4.0–10.5)

## 2014-04-08 LAB — HEMOGLOBIN A1C
Hgb A1c MFr Bld: 8.5 % — ABNORMAL HIGH (ref <5.7)
Mean Plasma Glucose: 197 mg/dL — ABNORMAL HIGH (ref <117)

## 2014-04-08 LAB — URINE CULTURE: Colony Count: 50000

## 2014-04-08 LAB — TROPONIN I

## 2014-04-08 MED ORDER — PERFLUTREN LIPID MICROSPHERE
1.0000 mL | INTRAVENOUS | Status: AC | PRN
  Administered 2014-04-08: 2 mL via INTRAVENOUS
  Filled 2014-04-08: qty 10

## 2014-04-08 MED ORDER — METOPROLOL TARTRATE 25 MG PO TABS
25.0000 mg | ORAL_TABLET | Freq: Two times a day (BID) | ORAL | Status: DC
  Administered 2014-04-08 – 2014-04-09 (×3): 25 mg via ORAL
  Filled 2014-04-08 (×4): qty 1

## 2014-04-08 NOTE — Progress Notes (Signed)
Pt would like chaplain visit and requested that a chaplain return in the afternoon to see her.   04/08/14 0900  Clinical Encounter Type  Visited With Patient and family together  Visit Type Initial  Referral From Nurse  Consult/Referral To [place], [place], [place] 04/08/2014 9:06 AM

## 2014-04-08 NOTE — Progress Notes (Signed)
Utilization review completed.  

## 2014-04-08 NOTE — Evaluation (Signed)
Physical Therapy Evaluation Patient Details Name: [name] MRN: [id number] DOB: [birth year] Today's Date: 04/08/2014   History of Present Illness  Pt is very pleasant 78 yo female with HTN, HLD, DM, who presented to [facility] ED with main concern of several days duration of intermittent episodes of lightheadedness, weakness, unsteady gait. Pt reports she gets midsternal chest pain and non radiating discomfort with short duration of only a few minutes.  Clinical Impression  Pt was able to show PT her current stability to walk with a walker and without assistive device as she has been doing in her room.  Asked pt to use the walker, and she will expect to possibly get a new walker for home as she is using a borrowed one for now. Recommend 24/7 supervision and HHPT to follow up with pt agreeing.    Follow Up Recommendations Home health PT;Supervision/Assistance - 24 hour    Equipment Recommendations  Other (comment) (Pt requests a shower bench and commode seat)    Recommendations for Other Services OT consult     Precautions / Restrictions Precautions Precautions: Fall;ICD/Pacemaker Precaution Comments: Pt not especiall y feaful of falling and walking alone unsteadily Restrictions Weight Bearing Restrictions: No      Mobility  Bed Mobility Overal bed mobility: Modified Independent                Transfers Overall transfer level: Modified independent Equipment used: Rolling walker (2 wheeled)             General transfer comment: Tends to sit with less control of descent than PT prefers, minimal control with hands.  Ambulation/Gait Ambulation/Gait assistance: Min assist Ambulation Distance (Feet): 100 Feet Assistive device: 1 person hand held assist Gait Pattern/deviations: Step-through pattern;Decreased stride length;Decreased step length - right;Decreased step length - left;Wide base of support;Ataxic Gait velocity: reduced Gait velocity interpretation: Below normal  speed for age/gender General Gait Details: Widespread joint changes that make pt a little unsteady but with actual gait she is quick to fatigue.  Pt has been up in her room with no AD, but when PT took her to walk on hallway she was too SOB to make the entire trip and needed 3 stops to catch her breath briefly  Stairs            Wheelchair Mobility    Modified Rankin (Stroke Patients Only)       Balance Overall balance assessment: Needs assistance Sitting-balance support: Feet supported Sitting balance-Leahy Scale: Fair Sitting balance - Comments: Tired even with foot support bedside Postural control: Posterior lean Standing balance support: Bilateral upper extremity supported Standing balance-Leahy Scale: Poor Standing balance comment: Requires a supervised level of standing due to using the furniture to balance                             Pertinent Vitals/Pain Pain Assessment: No/denies pain BP was 142/82, pulse 97 and O2 sat 97% per nsg note.    Home Living Family/patient expects to be discharged to:: Private residence Living Arrangements: Alone Available Help at Discharge: Family;Available PRN/intermittently Type of Home: House Home Access: Stairs to enter Entrance Stairs-Rails: Can reach both;Right;Left Entrance Stairs-Number of Steps: 3 Home Layout: One level Home Equipment: Walker - 2 wheels Additional Comments: requesting a commode seat and shower bench, walker is a donated item from another family member    Prior Function Level of Independence: Independent with assistive device(s)  Comments: Finding she needs her walker more lately     Hand Dominance        Extremity/Trunk Assessment   Upper Extremity Assessment: Overall WFL for tasks assessed           Lower Extremity Assessment: Generalized weakness      Cervical / Trunk Assessment: Normal  Communication   Communication: No difficulties  Cognition  Arousal/Alertness: Awake/alert Behavior During Therapy: WFL for tasks assessed/performed Overall Cognitive Status: Within Functional Limits for tasks assessed                      General Comments General comments (skin integrity, edema, etc.): Family member was in to observe the entire session, with discussion about her going home being agreeable to them    Exercises        Assessment/Plan    PT Assessment Patient needs continued PT services  PT Diagnosis Difficulty walking   PT Problem List Decreased strength;Decreased range of motion;Decreased activity tolerance;Decreased balance;Decreased mobility;Decreased coordination;Decreased knowledge of use of DME;Decreased safety awareness;Decreased knowledge of precautions  PT Treatment Interventions DME instruction;Gait training;Stair training;Functional mobility training;Therapeutic activities;Therapeutic exercise;Balance training;Neuromuscular re-education;Patient/family education   PT Goals (Current goals can be found in the Care Plan section) Acute Rehab PT Goals Patient Stated Goal: To get home safely PT Goal Formulation: With patient Time For Goal Achievement: 04/15/14 Potential to Achieve Goals: Good    Frequency Min 3X/week   Barriers to discharge Inaccessible home environment;Decreased caregiver support      Co-evaluation               End of Session Equipment Utilized During Treatment: Gait belt Activity Tolerance: Patient limited by fatigue Patient left: in chair;with call bell/phone within reach;with chair alarm set;with family/visitor present Nurse Communication: Mobility status         Time: 1232-1303 PT Time Calculation (min): 31 min   Charges:   PT Evaluation $Initial PT Evaluation Tier I: 1 Procedure PT Treatments $Gait Training: 8-22 mins   PT G CodesRamond Dial 2014/04/24, 1:39 PM Mee Hives, PT MS Acute Rehab Dept. Number: [phone]

## 2014-04-08 NOTE — Plan of Care (Signed)
Problem: Acute Rehab PT Goals(only PT should resolve) Goal: Pt Will Ambulate Level surfaces     

## 2014-04-08 NOTE — Progress Notes (Signed)
Echocardiogram 2D Echocardiogram with Definity has been performed.  [name] 04/08/2014, 4:29 PM

## 2014-04-08 NOTE — Progress Notes (Addendum)
[facility] Progress Note   [name] OHY:[id number] DOB: 10/11/1935 DOA: 04/07/2014 PCP: [name], MD  Brief narrative: [name] is a 78 y.o. female with HTN, HLD, DM presenting with episodes of dizziness, and weakness starting about 8 days ago. She feel like when the episodes are severe, she feels like she is going to pass out. She often has a sharp left sided chest pain.  She was noted on arrival to the ER to be tachycardic but was not orthostatic. She has continued to be persistently tachycardic even at rest  Subjective: No complaints while lying in bed other than feeling weak.   Assessment/Plan: Principal Problem: Tachycardia with 100 - 120 beats per minute with chest pain on ambulation Near syncope - work up for pheochromocytoma - obtain MRA for posterior circulation eval - TSH normal - check cortisol level - f/u on ECHO - add Metoprolol  Active Problems:   DM (diabetes mellitus) - cont insulin and follow sugars  HTN - cont lisinopril - add Metoprolol  Code Status: full code Family Communication: none Disposition Plan: to be determined DVT prophylaxis: Lovenox  Consultants: none  Procedures: none  Antibiotics: Anti-infectives   None     Objective: Filed Weights   04/07/14 1101 04/08/14 0400  Weight: 64.411 kg (142 lb) 67.45 kg (148 lb 11.2 oz)   No intake or output data in the 24 hours ending 04/08/14 1231   Vitals Filed Vitals:   04/07/14 1841 04/07/14 1943 04/08/14 0000 04/08/14 0400  BP: 164/99 162/68 139/63 142/82  Pulse: 126 113 101 97  Temp:  98.1 F (36.7 C) 98.3 F (36.8 C) 97.9 F (36.6 C)  TempSrc:  Oral Oral Oral  Resp:  20 18 18   Height:      Weight:    67.45 kg (148 lb 11.2 oz)  SpO2:  95% 97% 97%    Exam: General: No acute respiratory distress Lungs: Clear to auscultation bilaterally without wheezes or crackles Cardiovascular: Regular rate and rhythm- tachycardia-  without murmur gallop or rub normal  S1 and S2 Abdomen: Nontender, nondistended, soft, bowel sounds positive, no rebound, no ascites, no appreciable mass Extremities: No significant cyanosis, clubbing, or edema bilateral lower extremities  Data Reviewed: Basic Metabolic Panel:  Recent Labs Lab 04/07/14 1130 04/08/14 0010  NA 142 145  K 4.4 4.1  CL 99 102  CO2 28 31  GLUCOSE 174* 187*  BUN 15 12  CREATININE 0.93 0.77  CALCIUM 12.0* 9.8   Liver Function Tests: No results found for this basename: AST, ALT, ALKPHOS, BILITOT, PROT, ALBUMIN,  in the last 168 hours No results found for this basename: LIPASE, AMYLASE,  in the last 168 hours No results found for this basename: AMMONIA,  in the last 168 hours CBC:  Recent Labs Lab 04/07/14 1130 04/08/14 0010  WBC 8.8 10.4  HGB 13.5 12.4  HCT 40.8 38.5  MCV 93.4 94.6  PLT 285 265   Cardiac Enzymes:  Recent Labs Lab 04/07/14 2035 04/08/14 0010 04/08/14 0548  TROPONINI <0.30 <0.30 <0.30   BNP (last 3 results)  Recent Labs  04/07/14 2035  PROBNP 63.8   CBG:  Recent Labs Lab 04/07/14 1136 04/07/14 2302 04/08/14 0739  GLUCAP 181* 171* 119*    Recent Results (from the past 240 hour(s))  MRSA PCR SCREENING     Status: None   Collection Time    04/07/14  6:36 PM      Result Value Ref Range Status   MRSA by  PCR NEGATIVE  NEGATIVE Final   Comment:            The GeneXpert MRSA Assay (FDA     approved for NASAL specimens     only), is one component of a     comprehensive MRSA colonization     surveillance program. It is not     intended to diagnose MRSA     infection nor to guide or     monitor treatment for     MRSA infections.     Studies:  Recent x-ray studies have been reviewed in detail by the Attending Physician  Scheduled Meds:  Scheduled Meds: . enoxaparin (LOVENOX) injection  40 mg Subcutaneous Q24H  . folic acid  1 mg Oral Daily  . insulin aspart  0-9 Units Subcutaneous TID WC  . insulin glargine  10 Units Subcutaneous QHS   . lisinopril  20 mg Oral Daily  . sodium chloride  3 mL Intravenous Q12H   Continuous Infusions:   Time spent on care of this patient: 35 min   [place], MD 04/08/2014, 12:31 PM  LOS: 1 day   [facility] Office  [phone] Pager - Text Page per [email or web address]  If 7PM-7AM, please contact night-coverage [email or web address]

## 2014-04-08 NOTE — Progress Notes (Signed)
Chaplain responded to consult placed by Ms. [name] requesting prayer. Chaplain explored her concerns surrounding her being here and engaged in empathic listening. She asked that I return tomorrow or Sunday to pray with her if she's here. I will leave a note for the weekend Chaplain as I have informed her. She noted that her daughter and husband will be visiting her this weekend and that she has very strong familial support.  [name] 04/08/2014 4:30 PM

## 2014-04-09 DIAGNOSIS — R7309 Other abnormal glucose: Secondary | ICD-10-CM

## 2014-04-09 DIAGNOSIS — R079 Chest pain, unspecified: Secondary | ICD-10-CM | POA: Diagnosis not present

## 2014-04-09 LAB — GLUCOSE, CAPILLARY
Glucose-Capillary: 133 mg/dL — ABNORMAL HIGH (ref 70–99)
Glucose-Capillary: 141 mg/dL — ABNORMAL HIGH (ref 70–99)
Glucose-Capillary: 110 mg/dL — ABNORMAL HIGH (ref 70–99)

## 2014-04-09 MED ORDER — MAGNESIUM HYDROXIDE 400 MG/5ML PO SUSP
15.0000 mL | Freq: Once | ORAL | Status: AC
  Administered 2014-04-09: 15 mL via ORAL
  Filled 2014-04-09: qty 30

## 2014-04-09 MED ORDER — ASPIRIN EC 81 MG PO TBEC: 81.0000 mg | DELAYED_RELEASE_TABLET | Freq: Every day | ORAL | Status: DC

## 2014-04-09 MED ORDER — METOPROLOL TARTRATE 25 MG PO TABS: 25.0000 mg | ORAL_TABLET | Freq: Two times a day (BID) | ORAL | Status: DC

## 2014-04-09 MED ORDER — MAGNESIUM HYDROXIDE 400 MG/5ML PO SUSP: 15.0000 mL | Freq: Every day | ORAL | Status: DC

## 2014-04-09 NOTE — Discharge Summary (Signed)
Physician Discharge Summary  [name] DVV:[id number] DOB: 10/08/1935 DOA: 04/07/2014  PCP: [name], MD  Admit date: 04/07/2014 Discharge date: 04/09/2014  Time spent: >45 minutes  Recommendations for Outpatient Follow-up:  1. F/u on work up for pheochromocytoma    Discharge Condition: stable Diet recommendation: heart healthy  Discharge Diagnoses:  Principal Problem:   Tachycardia with 100 - 120 beats per minute Active Problems:   Rheumatoid arthritis(714.0)   Chest pain   DM (diabetes mellitus)   History of present illness:  [name] is a 78 y.o. female with HTN, HLD, DM presenting with episodes of dizziness, and weakness starting about 8 days ago. She feel like when the episodes are severe, she feels like she is going to pass out. She often has a sharp left sided chest pain however, she has had the pain in the past for may years without the tachycardia. She was noted on arrival to the ER to be tachycardic but was not orthostatic. She has continued to be persistently tachycardic even at rest and underwent a CT of the chest which was negative for a PE. Her orthostatic vitals were negative.  She was admitted for further work up.   Hospital Course:  Principal Problem:  Tachycardia with 100 - 120 beats per minute with chest pain on ambulation  Near syncope  - 3 sets of troponins negative, EKG and BNP normal and no recurrence of symptoms in the hospital - per patient, chest pain is chronic and not changed from her baseline - work up for pheochromocytoma done- results pending and will need to be followed by her PCP - TSH normal  - cortisol level normal -  ECHO report is essentially unrevealing- has mild to mod MR and no wall motion abnormalities - obtained MRA of the brain for posterior circulation eval - one segment of stenosis in right PCA with is likely not resulting in symptoms- see report below - have started ASA 81 mg-will need a fasting  Lipid profile as  outlpt - added Metoprolol with significant improvement in symptoms - recommend follow up of work up for Pheochromocytoma as outpt  Active Problems:  DM (diabetes mellitus)  - A1c is 8/5- Cont home doses of insulin as sugars have been stable while in the hospital  HTN  - cont lisinopril - added Metoprolol      Procedures: ECHO  - Left ventricle: The cavity size was normal. There was moderate focal basal and mild concentric hypertrophy. Systolic function was normal. The estimated ejection fraction was in the range of 55% to 60%. Wall motion was normal; there were no regional wall motion abnormalities. There was an increased relative contribution of atrial contraction to ventricular filling. Doppler parameters are consistent with abnormal left ventricular relaxation (grade 1 diastolic dysfunction). - Aortic valve: Trileaflet; normal thickness, mildly calcified leaflets. - Mitral valve: There was mild to moderate regurgitation.   Consultations:  none  Discharge Exam: Filed Weights   04/07/14 1101 04/08/14 0400 04/09/14 0400  Weight: 64.411 kg (142 lb) 67.45 kg (148 lb 11.2 oz) 67.644 kg (149 lb 2.1 oz)   Filed Vitals:   04/09/14 1205  BP: 143/52  Pulse: 79  Temp: 98.7 F (37.1 C)  Resp: 20    General: AAO x 3, no distress Cardiovascular: RRR, no murmurs  Respiratory: clear to auscultation bilaterally GI: soft, non-tender, non-distended, bowel sound positive  Discharge Instructions You were cared for by a hospitalist during your hospital stay. If you have any questions about your  discharge medications or the care you received while you were in the hospital after you are discharged, you can call the unit and asked to speak with the hospitalist on call if the hospitalist that took care of you is not available. Once you are discharged, your primary care physician will handle any further medical issues. Please note that NO REFILLS for any discharge medications will be  authorized once you are discharged, as it is imperative that you return to your primary care physician (or establish a relationship with a primary care physician if you do not have one) for your aftercare needs so that they can reassess your need for medications and monitor your lab values.     Medication List         albuterol 108 (90 BASE) MCG/ACT inhaler  Commonly known as:  PROVENTIL HFA;VENTOLIN HFA  Inhale 2 puffs into the lungs every 6 (six) hours as needed for wheezing or shortness of breath.     BC HEADACHE POWDER PO  Take 1 packet by mouth daily as needed (for pain).     folic acid 1 MG tablet  Commonly known as:  FOLVITE  Take 1 mg by mouth daily.     insulin glargine 100 unit/mL Sopn  Commonly known as:  LANTUS  Inject 10 Units into the skin daily at 10 pm.     lisinopril 20 MG tablet  Commonly known as:  PRINIVIL,ZESTRIL  Take 1 tablet (20 mg total) by mouth daily.     metFORMIN 1000 MG tablet  Commonly known as:  GLUCOPHAGE  Take 1 tablet (1,000 mg total) by mouth 2 (two) times daily with a meal.     metoprolol tartrate 25 MG tablet  Commonly known as:  LOPRESSOR  Take 1 tablet (25 mg total) by mouth 2 (two) times daily.     promethazine 12.5 MG tablet  Commonly known as:  PHENERGAN  Take 12.5 mg by mouth daily as needed for nausea.     SUMAtriptan 50 MG tablet  Commonly known as:  IMITREX  Take 1 tablet (50 mg total) by mouth every 2 (two) hours as needed for migraine.     traMADol 50 MG tablet  Commonly known as:  ULTRAM  Take 50 mg by mouth 2 (two) times daily as needed for pain.       No Known Allergies    The results of significant diagnostics from this hospitalization (including imaging, microbiology, ancillary and laboratory) are listed below for reference.    Significant Diagnostic Studies: Dg Chest 2 View  04/07/2014   CLINICAL DATA:  Chest pain.  Dizziness.  EXAM: CHEST  2 VIEW  COMPARISON:  12/10/2012  FINDINGS: The heart size is  normal. There is slight atelectasis or scarring at the right lung base. There are vascular calcifications in the right midlung zone, stable. Lungs are otherwise clear. No effusions. Old compression fractures of T11 and T12 treated with vertebroplasty. Arthritic changes of both shoulders. Slight reversal of the lower thoracic kyphosis.  IMPRESSION: Slight atelectasis/ scarring at the right lung base.   Electronically Signed   By: Rozetta Nunnery M.D.   On: 04/07/2014 13:19   Ct Angio Chest Pe W/cm &/or Wo Cm  04/07/2014   CLINICAL DATA:  78 year old female with a history of shortness of breath and chest pain.  EXAM: CT ANGIOGRAPHY CHEST WITH CONTRAST  TECHNIQUE: Multidetector CT imaging of the chest was performed using the standard protocol during bolus administration of intravenous contrast. Multiplanar  CT image reconstructions and MIPs were obtained to evaluate the vascular anatomy.  CONTRAST:  130mL OMNIPAQUE IOHEXOL 350 MG/ML SOLN  COMPARISON:  Multiple prior comparison, with most recent chest x-ray of the same date. Previous CT chest 02/02/2009  FINDINGS: Nonvascular:  Unremarkable appearance of visualized chest wall soft tissues. No supraclavicular or axillary adenopathy.  No displaced fracture identified. Mild degenerative changes of the visualized thoracic spine.  Unremarkable appearance of visualized thyroid and thoracic inlet.  Central airways are patent.  Mixed ground-glass and linear opacities of the dependent lung bases most compatible with mild atelectasis. No confluent airspace disease. No pneumothorax or pleural effusion. No visualized lung nodules.  No mediastinal adenopathy. Unremarkable appearance of the esophagus. Small hiatal hernia.  Heart size within normal limits.  No pericardial fluid/thickening.  Upper abdomen:  Fatty infiltration of the liver.  Re- demonstration of gastric, with increasing calcifications.  Partially visualized surgical changes of prior cholecystectomy.  Vascular:  Aorta:   No aneurysmal dilation. No dissection flap. No periaortic fluid. No significant calcifications. Unremarkable appearance of the branch vessels which remain patent.  Pulmonary:  Main pulmonary artery not enlarged. No visualized central, lobar, segmental, or proximal subsegmental filling defects.  Review of the MIP images confirms the above findings.  IMPRESSION: Study negative for pulmonary emboli.  Hepatic steatosis.  Small hiatal hernia.  Signed,  Dulcy Fanny. Earleen Newport, DO  [facility]  [facility]   Electronically Signed   By: Corrie Mckusick O.D.   On: 04/07/2014 16:20   Mr Jodene Nam Head Wo Contrast  04/09/2014   CLINICAL DATA:  Dizziness, near syncope  EXAM: MRI HEAD WITHOUT CONTRAST  MRA HEAD WITHOUT CONTRAST  TECHNIQUE: Multiplanar, multiecho pulse sequences of the brain and surrounding structures were obtained without intravenous contrast. Angiographic images of the head were obtained using MRA technique without contrast.  COMPARISON:  Prior MRI from 12/13/2012  FINDINGS: MRI HEAD FINDINGS  Mild age-related atrophy with chronic microvascular ischemic changes again seen, stable from prior. No mass lesion, midline shift, or extra-axial fluid collection. Ventricles are normal in size without evidence of hydrocephalus.  No diffusion-weighted signal abnormality is identified to suggest acute intracranial infarct. Gray-white matter differentiation is maintained. Normal flow voids are seen within the intracranial vasculature. No intracranial hemorrhage identified.  The cervicomedullary junction is normal. Pituitary gland is within normal limits. Pituitary stalk is midline. The globes and optic nerves demonstrate a normal appearance with normal signal intensity.  The bone marrow signal intensity is normal. Calvarium is intact. Degenerative changes noted within the upper cervical spine.  Scalp soft tissues are unremarkable.  Small amount a layering fluid present within the right  maxillary sinus. Paranasal sinuses are otherwise clear. No mastoid effusion.  MRA HEAD FINDINGS  ANTERIOR CIRCULATION:  The visualized portions of the distal cervical internal carotid arteries are widely patent with antegrade flow. The petrous, cavernous, and supra clinoid segments are symmetric in caliber bilaterally.  Right A1 segment is hypoplastic. Anterior communicating artery and anterior cerebral arteries are widely patent.  Middle cerebral arteries are widely patent with antegrade flow without high-grade flow-limiting stenosis or proximal branch occlusion. Distal MCA branches are symmetric bilaterally.  No intracranial aneurysm within the anterior circulation.  POSTERIOR CIRCULATION:  The vertebral arteries are widely patent with antegrade flow. The posterior inferior cerebral arteries are normal. Vertebrobasilar junction and basilar artery are widely patent with antegrade flow without evidence of basilar tip stenosis or aneurysm.  There is a short-segment moderate stenosis within the right  posterior cerebral artery (series 7, image 88). Right PCA is well opacified distally. Fetal origin of the right PCA noted. Posterior cerebral arteries are otherwise widely patent.  The superior cerebellar arteries arteries are widely patent bilaterally.  No intracranial aneurysm within the posterior circulation.  IMPRESSION: MRI HEAD IMPRESSION:  1. No acute intracranial infarct or other abnormality. 2. Mild age-related atrophy with chronic microvascular ischemic disease. 3. Mild rib right maxillary sinus disease.  MRA HEAD IMPRESSION:  1. No proximal branch occlusion identified within the intracranial circulation. Posterior circulation is widely patent. 2. Single short-segment focal moderate stenosis within the proximal right posterior cerebral artery as above. No other significant stenosis identified within the intracranial circulation. 3. Fetal origin of the right PCA.   Electronically Signed   By: Jeannine Boga  M.D.   On: 04/09/2014 01:04   Mr Brain Wo Contrast  04/09/2014   CLINICAL DATA:  Dizziness, near syncope  EXAM: MRI HEAD WITHOUT CONTRAST  MRA HEAD WITHOUT CONTRAST  TECHNIQUE: Multiplanar, multiecho pulse sequences of the brain and surrounding structures were obtained without intravenous contrast. Angiographic images of the head were obtained using MRA technique without contrast.  COMPARISON:  Prior MRI from 12/13/2012  FINDINGS: MRI HEAD FINDINGS  Mild age-related atrophy with chronic microvascular ischemic changes again seen, stable from prior. No mass lesion, midline shift, or extra-axial fluid collection. Ventricles are normal in size without evidence of hydrocephalus.  No diffusion-weighted signal abnormality is identified to suggest acute intracranial infarct. Gray-white matter differentiation is maintained. Normal flow voids are seen within the intracranial vasculature. No intracranial hemorrhage identified.  The cervicomedullary junction is normal. Pituitary gland is within normal limits. Pituitary stalk is midline. The globes and optic nerves demonstrate a normal appearance with normal signal intensity.  The bone marrow signal intensity is normal. Calvarium is intact. Degenerative changes noted within the upper cervical spine.  Scalp soft tissues are unremarkable.  Small amount a layering fluid present within the right maxillary sinus. Paranasal sinuses are otherwise clear. No mastoid effusion.  MRA HEAD FINDINGS  ANTERIOR CIRCULATION:  The visualized portions of the distal cervical internal carotid arteries are widely patent with antegrade flow. The petrous, cavernous, and supra clinoid segments are symmetric in caliber bilaterally.  Right A1 segment is hypoplastic. Anterior communicating artery and anterior cerebral arteries are widely patent.  Middle cerebral arteries are widely patent with antegrade flow without high-grade flow-limiting stenosis or proximal branch occlusion. Distal MCA branches are  symmetric bilaterally.  No intracranial aneurysm within the anterior circulation.  POSTERIOR CIRCULATION:  The vertebral arteries are widely patent with antegrade flow. The posterior inferior cerebral arteries are normal. Vertebrobasilar junction and basilar artery are widely patent with antegrade flow without evidence of basilar tip stenosis or aneurysm.  There is a short-segment moderate stenosis within the right posterior cerebral artery (series 7, image 88). Right PCA is well opacified distally. Fetal origin of the right PCA noted. Posterior cerebral arteries are otherwise widely patent.  The superior cerebellar arteries arteries are widely patent bilaterally.  No intracranial aneurysm within the posterior circulation.  IMPRESSION: MRI HEAD IMPRESSION:  1. No acute intracranial infarct or other abnormality. 2. Mild age-related atrophy with chronic microvascular ischemic disease. 3. Mild rib right maxillary sinus disease.  MRA HEAD IMPRESSION:  1. No proximal branch occlusion identified within the intracranial circulation. Posterior circulation is widely patent. 2. Single short-segment focal moderate stenosis within the proximal right posterior cerebral artery as above. No other significant stenosis identified within the intracranial circulation.  3. Fetal origin of the right PCA.   Electronically Signed   By: Jeannine Boga M.D.   On: 04/09/2014 01:04    Microbiology: Recent Results (from the past 240 hour(s))  URINE CULTURE     Status: None   Collection Time    04/07/14  2:00 PM      Result Value Ref Range Status   Specimen Description URINE, RANDOM   Final   Special Requests NONE   Final   Culture  Setup Time     Final   Value: 04/07/2014 20:21     Performed at [place]     Final   Value: 50,000 COLONIES/ML     Performed at [facility]   Culture     Final   Value: Multiple bacterial morphotypes present, none predominant. Suggest appropriate  recollection if clinically indicated.     Performed at [facility]   Report Status 04/08/2014 FINAL   Final  MRSA PCR SCREENING     Status: None   Collection Time    04/07/14  6:36 PM      Result Value Ref Range Status   MRSA by PCR NEGATIVE  NEGATIVE Final   Comment:            The GeneXpert MRSA Assay (FDA     approved for NASAL specimens     only), is one component of a     comprehensive MRSA colonization     surveillance program. It is not     intended to diagnose MRSA     infection nor to guide or     monitor treatment for     MRSA infections.     Labs: Basic Metabolic Panel:  Recent Labs Lab 04/07/14 1130 04/08/14 0010  NA 142 145  K 4.4 4.1  CL 99 102  CO2 28 31  GLUCOSE 174* 187*  BUN 15 12  CREATININE 0.93 0.77  CALCIUM 12.0* 9.8   Liver Function Tests: No results found for this basename: AST, ALT, ALKPHOS, BILITOT, PROT, ALBUMIN,  in the last 168 hours No results found for this basename: LIPASE, AMYLASE,  in the last 168 hours No results found for this basename: AMMONIA,  in the last 168 hours CBC:  Recent Labs Lab 04/07/14 1130 04/08/14 0010  WBC 8.8 10.4  HGB 13.5 12.4  HCT 40.8 38.5  MCV 93.4 94.6  PLT 285 265   Cardiac Enzymes:  Recent Labs Lab 04/07/14 2035 04/08/14 0010 04/08/14 0548  TROPONINI <0.30 <0.30 <0.30   BNP: BNP (last 3 results)  Recent Labs  04/07/14 2035  PROBNP 63.8   CBG:  Recent Labs Lab 04/08/14 1259 04/08/14 1658 04/08/14 2018 04/09/14 0755 04/09/14 1150  GLUCAP 178* 98 148* 133* 141*        SignedDebbe Odea, MD [facility] 04/09/2014, 4:28 PM

## 2014-04-13 LAB — METANEPHRINES, PLASMA
Normetanephrine, Free: 94 pg/mL (ref <=148)
TOTAL METANEPHRINES-PLASMA: 94 pg/mL (ref <=205)

## 2014-04-14 LAB — METANEPHRINES, URINE, 24 HOUR
METANEPH TOTAL UR: 589 ug/(24.h) (ref 224–832)
METANEPHRINES UR: 57 ug/(24.h) — AB (ref 90–315)
NORMETANEPHRINE 24H UR: 532 ug/(24.h) (ref 122–676)
VOLUME, URINE-METAN: 500 mL

## 2014-04-16 LAB — CATECHOLAMINES, FRACTIONATED, URINE, 24 HOUR
Catecholamines T: 72 mcg/24 h (ref 26–121)
Creatinine, Urine mg/day-CATEUR: 0.82 g/(24.h) (ref 0.63–2.50)
Dopamine 24 Hr Urine: 209 mcg/24 h (ref 52–480)
EPINEPHRINE 24H UR: 3 ug/(24.h) (ref 2–24)
Norepinephrine 24 Hr Urine: 69 mcg/24 h (ref 15–100)
TOTAL URINE VOLUME: 500 mL

## 2014-05-03 NOTE — Progress Notes (Signed)
Late entry for missed G-code. Based on review of the evaluation and goals by Mee Hives, PT.  2014/04/21 1345  PT G-Codes **NOT FOR INPATIENT CLASS**  Functional Assessment Tool Used Clinical judgement based on chart review  Functional Limitation Mobility: Walking and moving around  Mobility: Walking and Moving Around Current Status (B2620) CJ  Mobility: Walking and Moving Around Goal Status [phone]) CI  Lavonia Dana, PT  [phone] 05/03/2014

## 2014-06-19 ENCOUNTER — Encounter (HOSPITAL_COMMUNITY): Payer: Self-pay | Admitting: *Deleted

## 2014-06-19 ENCOUNTER — Emergency Department (HOSPITAL_COMMUNITY)
Admission: EM | Admit: 2014-06-19 | Discharge: 2014-06-19 | Disposition: A | Discharge status: Home / Self Care | Payer: PRIVATE HEALTH INSURANCE | Attending: Emergency Medicine | Admitting: Emergency Medicine

## 2014-06-19 DIAGNOSIS — N390 Urinary tract infection, site not specified: Secondary | ICD-10-CM | POA: Insufficient documentation

## 2014-06-19 DIAGNOSIS — R110 Nausea: Secondary | ICD-10-CM | POA: Insufficient documentation

## 2014-06-19 DIAGNOSIS — Z87891 Personal history of nicotine dependence: Secondary | ICD-10-CM | POA: Insufficient documentation

## 2014-06-19 DIAGNOSIS — M069 Rheumatoid arthritis, unspecified: Secondary | ICD-10-CM | POA: Insufficient documentation

## 2014-06-19 DIAGNOSIS — Z8639 Personal history of other endocrine, nutritional and metabolic disease: Secondary | ICD-10-CM

## 2014-06-19 DIAGNOSIS — Z79899 Other long term (current) drug therapy: Secondary | ICD-10-CM | POA: Diagnosis not present

## 2014-06-19 DIAGNOSIS — Z8719 Personal history of other diseases of the digestive system: Secondary | ICD-10-CM | POA: Insufficient documentation

## 2014-06-19 DIAGNOSIS — E119 Type 2 diabetes mellitus without complications: Secondary | ICD-10-CM | POA: Insufficient documentation

## 2014-06-19 DIAGNOSIS — J42 Unspecified chronic bronchitis: Secondary | ICD-10-CM | POA: Diagnosis not present

## 2014-06-19 DIAGNOSIS — Z794 Long term (current) use of insulin: Secondary | ICD-10-CM | POA: Diagnosis not present

## 2014-06-19 DIAGNOSIS — R51 Headache: Secondary | ICD-10-CM | POA: Diagnosis not present

## 2014-06-19 DIAGNOSIS — I209 Angina pectoris, unspecified: Secondary | ICD-10-CM | POA: Insufficient documentation

## 2014-06-19 DIAGNOSIS — Z8614 Personal history of Methicillin resistant Staphylococcus aureus infection: Secondary | ICD-10-CM | POA: Insufficient documentation

## 2014-06-19 DIAGNOSIS — I10 Essential (primary) hypertension: Secondary | ICD-10-CM | POA: Insufficient documentation

## 2014-06-19 DIAGNOSIS — Z7982 Long term (current) use of aspirin: Secondary | ICD-10-CM | POA: Insufficient documentation

## 2014-06-19 DIAGNOSIS — L03211 Cellulitis of face: Secondary | ICD-10-CM | POA: Insufficient documentation

## 2014-06-19 LAB — URINALYSIS, ROUTINE W REFLEX MICROSCOPIC
Bilirubin Urine: NEGATIVE
GLUCOSE, UA: NEGATIVE mg/dL
Hgb urine dipstick: NEGATIVE
KETONES UR: NEGATIVE mg/dL
NITRITE: NEGATIVE
Protein, ur: NEGATIVE mg/dL
Specific Gravity, Urine: 1.025 (ref 1.005–1.030)
Urobilinogen, UA: 0.2 mg/dL (ref 0.0–1.0)
pH: 6 (ref 5.0–8.0)

## 2014-06-19 LAB — URINE MICROSCOPIC-ADD ON

## 2014-06-19 LAB — CBG MONITORING, ED: Glucose-Capillary: 149 mg/dL — ABNORMAL HIGH (ref 70–99)

## 2014-06-19 MED ORDER — CEPHALEXIN 500 MG PO CAPS: 500.0000 mg | ORAL_CAPSULE | Freq: Four times a day (QID) | ORAL | Status: DC

## 2014-06-19 MED ORDER — SULFAMETHOXAZOLE-TRIMETHOPRIM 800-160 MG PO TABS: 1.0000 | ORAL_TABLET | Freq: Two times a day (BID) | ORAL | Status: AC

## 2014-06-19 NOTE — Discharge Instructions (Signed)
Return to the emergency room with worsening of symptoms, new symptoms or with symptoms that are concerning, especially fevers, nausea, vomiting, abdominal pain, need to tolerate fluids by mouth, no improvement of urinary symptoms. Follow-up with primary care provider in 2 days. Please take all of your antibiotics until finished!   You may develop abdominal discomfort or diarrhea from the antibiotic.  You may help offset this with probiotics which you can buy or get in yogurt. Do not eat  or take the probiotics until 2 hours after your antibiotic.
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
## 2014-06-22 LAB — URINE CULTURE: Colony Count: >=100000

## 2014-06-24 ENCOUNTER — Telehealth (HOSPITAL_COMMUNITY): Payer: Self-pay

## 2014-06-24 NOTE — Telephone Encounter (Signed)
Post ED Visit - Positive Culture Follow-up  Culture report reviewed by antimicrobial stewardship pharmacist: []  Wes [place], Pharm.D., BCPS [x]  Heide Guile, Pharm.D., BCPS []  Alycia Rossetti, Pharm.D., BCPS []  [place], Pharm.D., BCPS, AAHIVP []  Legrand Como, Pharm.D., BCPS, AAHIVP []  Elicia Lamp, Pharm.D.   Positive Urine culture Treated with Cephalexin, organism sensitive to the same and no further patient follow-up is required at this time.  Dortha Kern 06/24/2014, 5:54 AM

## 2014-08-23 DIAGNOSIS — J302 Other seasonal allergic rhinitis: Secondary | ICD-10-CM | POA: Diagnosis not present

## 2014-08-23 DIAGNOSIS — K219 Gastro-esophageal reflux disease without esophagitis: Secondary | ICD-10-CM | POA: Diagnosis not present

## 2014-08-23 DIAGNOSIS — I10 Essential (primary) hypertension: Secondary | ICD-10-CM | POA: Diagnosis not present

## 2014-08-23 DIAGNOSIS — E1165 Type 2 diabetes mellitus with hyperglycemia: Secondary | ICD-10-CM | POA: Diagnosis not present

## 2014-08-23 DIAGNOSIS — J029 Acute pharyngitis, unspecified: Secondary | ICD-10-CM | POA: Diagnosis not present

## 2014-09-05 DIAGNOSIS — M5417 Radiculopathy, lumbosacral region: Secondary | ICD-10-CM | POA: Diagnosis not present

## 2014-09-05 DIAGNOSIS — M2550 Pain in unspecified joint: Secondary | ICD-10-CM | POA: Diagnosis not present

## 2014-09-05 DIAGNOSIS — M0589 Other rheumatoid arthritis with rheumatoid factor of multiple sites: Secondary | ICD-10-CM | POA: Diagnosis not present

## 2014-09-05 DIAGNOSIS — M8580 Other specified disorders of bone density and structure, unspecified site: Secondary | ICD-10-CM | POA: Diagnosis not present

## 2014-09-05 DIAGNOSIS — Z79899 Other long term (current) drug therapy: Secondary | ICD-10-CM | POA: Diagnosis not present

## 2014-09-20 DIAGNOSIS — J449 Chronic obstructive pulmonary disease, unspecified: Secondary | ICD-10-CM | POA: Diagnosis not present

## 2014-09-20 DIAGNOSIS — E119 Type 2 diabetes mellitus without complications: Secondary | ICD-10-CM | POA: Diagnosis not present

## 2014-09-20 DIAGNOSIS — G4700 Insomnia, unspecified: Secondary | ICD-10-CM | POA: Diagnosis not present

## 2014-09-20 DIAGNOSIS — I10 Essential (primary) hypertension: Secondary | ICD-10-CM | POA: Diagnosis not present

## 2014-11-22 DIAGNOSIS — F430 Acute stress reaction: Secondary | ICD-10-CM | POA: Diagnosis not present

## 2014-11-22 DIAGNOSIS — E119 Type 2 diabetes mellitus without complications: Secondary | ICD-10-CM | POA: Diagnosis not present

## 2014-11-22 DIAGNOSIS — J449 Chronic obstructive pulmonary disease, unspecified: Secondary | ICD-10-CM | POA: Diagnosis not present

## 2014-11-22 DIAGNOSIS — I10 Essential (primary) hypertension: Secondary | ICD-10-CM | POA: Diagnosis not present

## 2014-12-05 DIAGNOSIS — Z79899 Other long term (current) drug therapy: Secondary | ICD-10-CM | POA: Diagnosis not present

## 2014-12-05 DIAGNOSIS — M0589 Other rheumatoid arthritis with rheumatoid factor of multiple sites: Secondary | ICD-10-CM | POA: Diagnosis not present

## 2014-12-05 DIAGNOSIS — M2550 Pain in unspecified joint: Secondary | ICD-10-CM | POA: Diagnosis not present

## 2014-12-05 DIAGNOSIS — M8580 Other specified disorders of bone density and structure, unspecified site: Secondary | ICD-10-CM | POA: Diagnosis not present

## 2015-01-05 DIAGNOSIS — F430 Acute stress reaction: Secondary | ICD-10-CM | POA: Diagnosis not present

## 2015-01-05 DIAGNOSIS — E119 Type 2 diabetes mellitus without complications: Secondary | ICD-10-CM | POA: Diagnosis not present

## 2015-01-05 DIAGNOSIS — I10 Essential (primary) hypertension: Secondary | ICD-10-CM | POA: Diagnosis not present

## 2015-01-05 DIAGNOSIS — J302 Other seasonal allergic rhinitis: Secondary | ICD-10-CM | POA: Diagnosis not present

## 2015-01-05 DIAGNOSIS — J449 Chronic obstructive pulmonary disease, unspecified: Secondary | ICD-10-CM | POA: Diagnosis not present

## 2015-01-09 DIAGNOSIS — M0589 Other rheumatoid arthritis with rheumatoid factor of multiple sites: Secondary | ICD-10-CM | POA: Diagnosis not present

## 2015-01-22 ENCOUNTER — Emergency Department (HOSPITAL_COMMUNITY): Payer: Medicare Other

## 2015-01-22 ENCOUNTER — Encounter (HOSPITAL_COMMUNITY): Payer: Self-pay | Admitting: Emergency Medicine

## 2015-01-22 ENCOUNTER — Inpatient Hospital Stay (HOSPITAL_COMMUNITY)
Admission: EM | Admit: 2015-01-22 | Discharge: 2015-01-24 | DRG: 690 | Disposition: A | Discharge status: Home Health | Payer: Medicare Other | Attending: Internal Medicine | Admitting: Internal Medicine

## 2015-01-22 DIAGNOSIS — J9811 Atelectasis: Secondary | ICD-10-CM | POA: Diagnosis not present

## 2015-01-22 DIAGNOSIS — R079 Chest pain, unspecified: Secondary | ICD-10-CM | POA: Diagnosis not present

## 2015-01-22 DIAGNOSIS — I5032 Chronic diastolic (congestive) heart failure: Secondary | ICD-10-CM | POA: Diagnosis present

## 2015-01-22 DIAGNOSIS — M069 Rheumatoid arthritis, unspecified: Secondary | ICD-10-CM | POA: Diagnosis not present

## 2015-01-22 DIAGNOSIS — R531 Weakness: Secondary | ICD-10-CM | POA: Diagnosis not present

## 2015-01-22 DIAGNOSIS — A419 Sepsis, unspecified organism: Secondary | ICD-10-CM

## 2015-01-22 DIAGNOSIS — Z794 Long term (current) use of insulin: Secondary | ICD-10-CM

## 2015-01-22 DIAGNOSIS — Z87891 Personal history of nicotine dependence: Secondary | ICD-10-CM | POA: Diagnosis not present

## 2015-01-22 DIAGNOSIS — I10 Essential (primary) hypertension: Secondary | ICD-10-CM | POA: Diagnosis not present

## 2015-01-22 DIAGNOSIS — Z79899 Other long term (current) drug therapy: Secondary | ICD-10-CM | POA: Diagnosis not present

## 2015-01-22 DIAGNOSIS — E872 Acidosis: Secondary | ICD-10-CM | POA: Diagnosis not present

## 2015-01-22 DIAGNOSIS — R000 Tachycardia, unspecified: Secondary | ICD-10-CM | POA: Diagnosis present

## 2015-01-22 DIAGNOSIS — R0602 Shortness of breath: Secondary | ICD-10-CM | POA: Diagnosis not present

## 2015-01-22 DIAGNOSIS — R7989 Other specified abnormal findings of blood chemistry: Secondary | ICD-10-CM

## 2015-01-22 DIAGNOSIS — E785 Hyperlipidemia, unspecified: Secondary | ICD-10-CM | POA: Diagnosis not present

## 2015-01-22 DIAGNOSIS — N390 Urinary tract infection, site not specified: Secondary | ICD-10-CM | POA: Diagnosis not present

## 2015-01-22 DIAGNOSIS — R270 Ataxia, unspecified: Secondary | ICD-10-CM | POA: Diagnosis not present

## 2015-01-22 DIAGNOSIS — Z7982 Long term (current) use of aspirin: Secondary | ICD-10-CM | POA: Diagnosis not present

## 2015-01-22 DIAGNOSIS — E878 Other disorders of electrolyte and fluid balance, not elsewhere classified: Secondary | ICD-10-CM

## 2015-01-22 DIAGNOSIS — Z791 Long term (current) use of non-steroidal anti-inflammatories (NSAID): Secondary | ICD-10-CM

## 2015-01-22 DIAGNOSIS — E860 Dehydration: Secondary | ICD-10-CM | POA: Diagnosis present

## 2015-01-22 DIAGNOSIS — B9620 Unspecified Escherichia coli [E. coli] as the cause of diseases classified elsewhere: Secondary | ICD-10-CM | POA: Diagnosis present

## 2015-01-22 DIAGNOSIS — E119 Type 2 diabetes mellitus without complications: Secondary | ICD-10-CM | POA: Diagnosis not present

## 2015-01-22 DIAGNOSIS — R911 Solitary pulmonary nodule: Secondary | ICD-10-CM | POA: Diagnosis not present

## 2015-01-22 DIAGNOSIS — Z7951 Long term (current) use of inhaled steroids: Secondary | ICD-10-CM

## 2015-01-22 DIAGNOSIS — N179 Acute kidney failure, unspecified: Secondary | ICD-10-CM | POA: Diagnosis present

## 2015-01-22 DIAGNOSIS — R42 Dizziness and giddiness: Secondary | ICD-10-CM | POA: Diagnosis not present

## 2015-01-22 HISTORY — DX: Other specified abnormal findings of blood chemistry: R79.89

## 2015-01-22 HISTORY — DX: Other disorders of electrolyte and fluid balance, not elsewhere classified: E87.8

## 2015-01-22 HISTORY — DX: Chronic diastolic (congestive) heart failure: I50.32

## 2015-01-22 LAB — HEPATIC FUNCTION PANEL
ALBUMIN: 3.2 g/dL — AB (ref 3.5–5.0)
ALK PHOS: 56 U/L (ref 38–126)
ALT: 29 U/L (ref 14–54)
AST: 38 U/L (ref 15–41)
TOTAL PROTEIN: 6.4 g/dL — AB (ref 6.5–8.1)
Total Bilirubin: 0.5 mg/dL (ref 0.3–1.2)

## 2015-01-22 LAB — BASIC METABOLIC PANEL
ANION GAP: 12 (ref 5–15)
BUN: 11 mg/dL (ref 6–20)
CO2: 25 mmol/L (ref 22–32)
CREATININE: 1.14 mg/dL — AB (ref 0.44–1.00)
Calcium: 9.5 mg/dL (ref 8.9–10.3)
Chloride: 99 mmol/L — ABNORMAL LOW (ref 101–111)
GFR, EST AFRICAN AMERICAN: 53 mL/min — AB (ref >60)
GFR, EST NON AFRICAN AMERICAN: 46 mL/min — AB (ref >60)
Glucose, Bld: 204 mg/dL — ABNORMAL HIGH (ref 65–99)
Potassium: 4.2 mmol/L (ref 3.5–5.1)
Sodium: 136 mmol/L (ref 135–145)

## 2015-01-22 LAB — CBC WITH DIFFERENTIAL/PLATELET
Basophils Absolute: 0 10*3/uL (ref 0.0–0.1)
Basophils Relative: 0 % (ref 0–1)
Eosinophils Absolute: 0.3 10*3/uL (ref 0.0–0.7)
Eosinophils Relative: 3 % (ref 0–5)
HEMATOCRIT: 38.4 % (ref 36.0–46.0)
Hemoglobin: 12.5 g/dL (ref 12.0–15.0)
LYMPHS ABS: 2.7 10*3/uL (ref 0.7–4.0)
LYMPHS PCT: 28 % (ref 12–46)
MCH: 30 pg (ref 26.0–34.0)
MCHC: 32.6 g/dL (ref 30.0–36.0)
MCV: 92.3 fL (ref 78.0–100.0)
MONOS PCT: 7 % (ref 3–12)
Monocytes Absolute: 0.7 10*3/uL (ref 0.1–1.0)
Neutro Abs: 6.2 10*3/uL (ref 1.7–7.7)
Neutrophils Relative %: 62 % (ref 43–77)
PLATELETS: 288 10*3/uL (ref 150–400)
RBC: 4.16 MIL/uL (ref 3.87–5.11)
RDW: 15.6 % — ABNORMAL HIGH (ref 11.5–15.5)
WBC: 9.9 10*3/uL (ref 4.0–10.5)

## 2015-01-22 LAB — URINALYSIS, ROUTINE W REFLEX MICROSCOPIC
Bilirubin Urine: NEGATIVE
Glucose, UA: NEGATIVE mg/dL
Hgb urine dipstick: NEGATIVE
KETONES UR: NEGATIVE mg/dL
Nitrite: POSITIVE — AB
Protein, ur: NEGATIVE mg/dL
Specific Gravity, Urine: 1.015 (ref 1.005–1.030)
Urobilinogen, UA: 0.2 mg/dL (ref 0.0–1.0)
pH: 5.5 (ref 5.0–8.0)

## 2015-01-22 LAB — URINE MICROSCOPIC-ADD ON

## 2015-01-22 LAB — I-STAT TROPONIN, ED: TROPONIN I, POC: 0.01 ng/mL (ref 0.00–0.08)

## 2015-01-22 LAB — SEDIMENTATION RATE: SED RATE: 26 mm/h — AB (ref 0–22)

## 2015-01-22 LAB — I-STAT CG4 LACTIC ACID, ED
Lactic Acid, Venous: 1.81 mmol/L (ref 0.5–2.0)
Lactic Acid, Venous: 3.14 mmol/L (ref 0.5–2.0)

## 2015-01-22 LAB — CK: Total CK: 100 U/L (ref 38–234)

## 2015-01-22 LAB — C-REACTIVE PROTEIN: CRP: 5.7 mg/dL — AB (ref <1.0)

## 2015-01-22 LAB — GLUCOSE, CAPILLARY
GLUCOSE-CAPILLARY: 109 mg/dL — AB (ref 65–99)
Glucose-Capillary: 153 mg/dL — ABNORMAL HIGH (ref 65–99)

## 2015-01-22 LAB — PROCALCITONIN: Procalcitonin: <0.1 ng/mL

## 2015-01-22 LAB — LACTIC ACID, PLASMA
LACTIC ACID, VENOUS: 3.2 mmol/L — AB (ref 0.5–2.0)
Lactic Acid, Venous: 2.4 mmol/L (ref 0.5–2.0)

## 2015-01-22 LAB — PROTIME-INR
INR: 1.13 (ref 0.00–1.49)
Prothrombin Time: 14.7 seconds (ref 11.6–15.2)

## 2015-01-22 LAB — APTT: APTT: 28 s (ref 24–37)

## 2015-01-22 LAB — CBG MONITORING, ED: Glucose-Capillary: 144 mg/dL — ABNORMAL HIGH (ref 65–99)

## 2015-01-22 MED ORDER — FLUTICASONE PROPIONATE 50 MCG/ACT NA SUSP
2.0000 | Freq: Every day | NASAL | Status: DC
  Administered 2015-01-22 – 2015-01-24 (×3): 2 via NASAL
  Filled 2015-01-22: qty 16

## 2015-01-22 MED ORDER — METHOTREXATE 2.5 MG PO TABS: 25.0000 mg | ORAL_TABLET | ORAL | Status: DC

## 2015-01-22 MED ORDER — SODIUM CHLORIDE 0.9 % IV BOLUS (SEPSIS)
1000.0000 mL | Freq: Once | INTRAVENOUS | Status: AC
  Administered 2015-01-22: 1000 mL via INTRAVENOUS

## 2015-01-22 MED ORDER — ONDANSETRON HCL 4 MG/2ML IJ SOLN: 4.0000 mg | Freq: Four times a day (QID) | INTRAMUSCULAR | Status: DC | PRN

## 2015-01-22 MED ORDER — FUROSEMIDE 10 MG/ML IJ SOLN
20.0000 mg | Freq: Once | INTRAMUSCULAR | Status: AC
  Administered 2015-01-22: 20 mg via INTRAVENOUS
  Filled 2015-01-22: qty 2

## 2015-01-22 MED ORDER — ACETAMINOPHEN 325 MG PO TABS: 650.0000 mg | ORAL_TABLET | Freq: Four times a day (QID) | ORAL | Status: DC | PRN

## 2015-01-22 MED ORDER — GLIMEPIRIDE 2 MG PO TABS
2.0000 mg | ORAL_TABLET | Freq: Every day | ORAL | Status: DC
  Administered 2015-01-23 – 2015-01-24 (×2): 2 mg via ORAL
  Filled 2015-01-22 (×3): qty 1

## 2015-01-22 MED ORDER — INSULIN ASPART 100 UNIT/ML ~~LOC~~ SOLN
0.0000 [IU] | Freq: Three times a day (TID) | SUBCUTANEOUS | Status: DC
  Administered 2015-01-23: 3 [IU] via SUBCUTANEOUS
  Administered 2015-01-23: 2 [IU] via SUBCUTANEOUS
  Administered 2015-01-24 (×2): 3 [IU] via SUBCUTANEOUS

## 2015-01-22 MED ORDER — HYDROCHLOROTHIAZIDE 12.5 MG PO CAPS
12.5000 mg | ORAL_CAPSULE | Freq: Every day | ORAL | Status: DC
  Administered 2015-01-23 – 2015-01-24 (×2): 12.5 mg via ORAL
  Filled 2015-01-22 (×4): qty 1

## 2015-01-22 MED ORDER — SODIUM CHLORIDE 0.9 % IV SOLN: INTRAVENOUS | Status: DC

## 2015-01-22 MED ORDER — FOLIC ACID 1 MG PO TABS
1.0000 mg | ORAL_TABLET | Freq: Every day | ORAL | Status: DC
  Administered 2015-01-23 – 2015-01-24 (×2): 1 mg via ORAL
  Filled 2015-01-22 (×2): qty 1

## 2015-01-22 MED ORDER — ENOXAPARIN SODIUM 40 MG/0.4ML ~~LOC~~ SOLN
40.0000 mg | SUBCUTANEOUS | Status: DC
  Administered 2015-01-22 – 2015-01-23 (×2): 40 mg via SUBCUTANEOUS
  Filled 2015-01-22 (×3): qty 0.4

## 2015-01-22 MED ORDER — LISINOPRIL 20 MG PO TABS
20.0000 mg | ORAL_TABLET | Freq: Every day | ORAL | Status: DC
  Administered 2015-01-23 – 2015-01-24 (×2): 20 mg via ORAL
  Filled 2015-01-22 (×2): qty 1

## 2015-01-22 MED ORDER — LINAGLIPTIN 5 MG PO TABS
5.0000 mg | ORAL_TABLET | Freq: Every day | ORAL | Status: DC
  Administered 2015-01-23 – 2015-01-24 (×2): 5 mg via ORAL
  Filled 2015-01-22 (×2): qty 1

## 2015-01-22 MED ORDER — DIAZEPAM 5 MG PO TABS: 5.0000 mg | ORAL_TABLET | Freq: Every day | ORAL | Status: DC | PRN

## 2015-01-22 MED ORDER — IOHEXOL 350 MG/ML SOLN
80.0000 mL | Freq: Once | INTRAVENOUS | Status: AC | PRN
  Administered 2015-01-22: 80 mL via INTRAVENOUS

## 2015-01-22 MED ORDER — INSULIN ASPART 100 UNIT/ML ~~LOC~~ SOLN: 0.0000 [IU] | Freq: Every day | SUBCUTANEOUS | Status: DC

## 2015-01-22 MED ORDER — DEXTROSE 5 % IV SOLN
1.0000 g | Freq: Once | INTRAVENOUS | Status: AC
  Administered 2015-01-22: 1 g via INTRAVENOUS
  Filled 2015-01-22: qty 10

## 2015-01-22 MED ORDER — SODIUM CHLORIDE 0.9 % IJ SOLN
3.0000 mL | Freq: Two times a day (BID) | INTRAMUSCULAR | Status: DC
  Administered 2015-01-22 (×2): 3 mL via INTRAVENOUS

## 2015-01-22 MED ORDER — ACETAMINOPHEN 650 MG RE SUPP: 650.0000 mg | Freq: Four times a day (QID) | RECTAL | Status: DC | PRN

## 2015-01-22 MED ORDER — SODIUM CHLORIDE 0.9 % IV BOLUS (SEPSIS)
1000.0000 mL | INTRAVENOUS | Status: AC
  Administered 2015-01-22 (×2): 1000 mL via INTRAVENOUS

## 2015-01-22 MED ORDER — TRAMADOL HCL 50 MG PO TABS
50.0000 mg | ORAL_TABLET | Freq: Four times a day (QID) | ORAL | Status: DC | PRN
Start: 2015-01-22 — End: 2015-01-24
  Administered 2015-01-23 – 2015-01-24 (×2): 50 mg via ORAL
  Filled 2015-01-22 (×2): qty 1

## 2015-01-22 MED ORDER — LISINOPRIL-HYDROCHLOROTHIAZIDE 20-12.5 MG PO TABS: 1.0000 | ORAL_TABLET | Freq: Every day | ORAL | Status: DC

## 2015-01-22 MED ORDER — SODIUM CHLORIDE 0.9 % IV BOLUS (SEPSIS)
250.0000 mL | Freq: Once | INTRAVENOUS | Status: AC
  Administered 2015-01-22: 250 mL via INTRAVENOUS

## 2015-01-22 MED ORDER — ALUM & MAG HYDROXIDE-SIMETH 200-200-20 MG/5ML PO SUSP: 30.0000 mL | Freq: Four times a day (QID) | ORAL | Status: DC | PRN

## 2015-01-22 MED ORDER — ALBUTEROL SULFATE (2.5 MG/3ML) 0.083% IN NEBU: 3.0000 mL | INHALATION_SOLUTION | Freq: Four times a day (QID) | RESPIRATORY_TRACT | Status: DC | PRN

## 2015-01-22 MED ORDER — METHOTREXATE 2.5 MG PO TABS
25.0000 mg | ORAL_TABLET | ORAL | Status: DC
  Administered 2015-01-23: 25 mg via ORAL
  Filled 2015-01-22: qty 10

## 2015-01-22 MED ORDER — ZOLPIDEM TARTRATE 5 MG PO TABS
5.0000 mg | ORAL_TABLET | Freq: Every evening | ORAL | Status: DC | PRN
  Administered 2015-01-23: 5 mg via ORAL
  Filled 2015-01-22: qty 1

## 2015-01-22 MED ORDER — DEXTROSE 5 % IV SOLN
2.0000 g | INTRAVENOUS | Status: DC
  Administered 2015-01-23 – 2015-01-24 (×2): 2 g via INTRAVENOUS
  Filled 2015-01-22 (×2): qty 2

## 2015-01-22 MED ORDER — ONDANSETRON HCL 4 MG PO TABS: 4.0000 mg | ORAL_TABLET | Freq: Four times a day (QID) | ORAL | Status: DC | PRN

## 2015-01-22 MED ORDER — CEFTRIAXONE SODIUM 2 G IJ SOLR: 2.0000 g | Freq: Once | INTRAMUSCULAR | Status: DC

## 2015-01-22 NOTE — H&P (Signed)
[facility] History and Physical                                                                                    [name], is a 79 y.o. female  MRN: [id number]   DOB - 02/25/1936  Admit Date - 01/22/2015  Outpatient Primary MD for the patient is [name], MD  Referring MD: Sabra Heck  / ER  With History of -  Past Medical History  Diagnosis Date  . Hypertension   . Chronic bronchitis     "get it just about q yr" (04/07/2014)  . Anginal pain   . Exertional shortness of breath   . High cholesterol   . Type II diabetes mellitus   . GERD (gastroesophageal reflux disease)   . Headache(784.0)     "once or twice/week" (04/07/2014)  . Arthritis     "all over my body"       Past Surgical History  Procedure Laterality Date  . Back surgery    . Cholecystectomy  1990's  . Appendectomy  1969  . Lumbar disc surgery      "slipped disc" (12/11/2012)  . Shoulder surgery Left ~ 2009    "put cement down in it"   . Vaginal hysterectomy  ~ 1973  . Ectopic pregnancy surgery  1969    in for   Chief Complaint  Patient presents with  . Generalized Body Aches     HPI This is a 79 year old female patient with known diabetes, rheumatoid arthritis on chronic immunosuppression, hypertension, chronic bronchitis, apparent chronic dyspnea on exertion as well as dyslipidemia on statin. She was last admitted in September 2015 after presenting with dizziness and weakness with a sensation that she was going to pass out. During that evaluation she remained chronically tachycardic with heart rates between 100 and 120 but she was not orthostatic. It was recommended after that admission she pursue outpatient follow-up for possible pheochromocytoma. She returns to the ER today with generalized weakness with progressive dyspnea on exertion and some paroxysmal nocturnal dyspnea without clear signs of congestive heart failure as well as arthralgias that have become worsened over 1 week. She has  also noticed some dysuria and incomplete bladder emptying. She also was reporting some disequilibrium symptoms where she reports she has difficulty maintaining her balance and "I can't control we are undergoing". She does report some occasional dizziness that no true vertigo symptoms. In regards to the dyspnea on exertion she has seen her primary care physician for this and was prescribed an inhaler. She has not had any significant increase or decrease in her weight recently but has noticed over the past 1-2 years her activity level and tolerance has decreased.  In the ER she was afebrile, her heart rate was 124 bpm, respiratory rate was 18, her initial blood pressure was 116/69. Her room air saturations were 92%. There were concerns patient may been orthostatic given the fact she is on multiple blood pressure medications including medications for thiazide diuretics if she was given a liter of fluids. Because of the tachycardia CT angiogram of the chest was ordered that did not reveal any significant issues including no evidence  of a pulmonary embolus. Because for disequilibrium symptoms and MRI of the brain was ordered and is too was negative without any acute process. Laboratory data revealed mild renal insufficiency with a creatinine of 1.14 with her baseline creatinine averaging 0.77. Troponin was normal, CBC was within normal limits. Lactate at 7:30 AM was 1.81 but repeat lactate by 12 PM had increased to 3.14 despite IV fluids. Patient's urinalysis appeared consistent with UTI and given positive urinary symptoms there was some concern patient may be evolving a septic process. Blood and urine cultures are pending.   Review of Systems   In addition to the HPI above,  No Fever-chills No Headache, changes with Vision or hearing, new weakness, tingling, numbness in any extremity, No problems swallowing food or Liquids, indigestion/reflux No Chest pain, Cough, palpitations, orthopnea  No Abdominal pain,  N/V; no melena or hematochezia, no dark tarry stools, Bowel movements are regular, No dysuria, hematuria or flank pain No new skin rashes, lesions, masses or bruises, No recent weight gain or loss No polyuria, polydypsia or polyphagia,  *A full 10 point Review of Systems was done, except as stated above, all other Review of Systems were negative.  Social History History  Substance Use Topics  . Smoking status: Former Smoker    Types: Cigarettes  . Smokeless tobacco: Never Used     Comment: 04/07/2014"1 pack of cigarettes would last 3-4 months; haven't had any cigarettes for 40 years"  . Alcohol Use: No    Resides at:  Lives with:  Ambulatory status:   Family History No family history on file. Patient unaware of any significant chronic medical problems in her family members.   Prior to Admission medications   Medication Sig Start Date End Date Taking? Authorizing Provider  albuterol (PROVENTIL HFA;VENTOLIN HFA) 108 (90 BASE) MCG/ACT inhaler Inhale 2 puffs into the lungs every 6 (six) hours as needed for wheezing or shortness of breath.   Yes Historical Provider, MD  aspirin EC 81 MG tablet Take 1 tablet (81 mg total) by mouth daily. Patient not taking: Reported on 01/22/2015 04/09/14   Debbe Odea, MD  Aspirin-Salicylamide-Caffeine (BC HEADACHE POWDER PO) Take 1 packet by mouth daily as needed (for pain).    Yes Historical Provider, MD  atorvastatin (LIPITOR) 20 MG tablet Take 20 mg by mouth every morning.  01/11/15  Yes Historical Provider, MD  diazepam (VALIUM) 5 MG tablet take 1 tablet by mouth once daily if needed 01/05/15  Yes Historical Provider, MD  fluticasone (FLONASE) 50 MCG/ACT nasal spray instill 2 sprays into each nostril once daily 01/05/15  Yes Historical Provider, MD  folic acid (FOLVITE) 1 MG tablet Take 1 mg by mouth daily.   Yes Historical Provider, MD  glimepiride (AMARYL) 2 MG tablet Take 2 mg by mouth daily with breakfast.  01/11/15  Yes Historical Provider, MD    insulin glargine (LANTUS) 100 units/mL SOLN Inject 10 Units into the skin daily at 10 pm. Patient not taking: Reported on 01/22/2015 12/15/12   Verlee Monte, MD  JANUVIA 100 MG tablet Take 100 mg by mouth daily.  01/11/15  Yes Historical Provider, MD  lisinopril-hydrochlorothiazide (PRINZIDE,ZESTORETIC) 20-12.5 MG per tablet Take 1 tablet by mouth daily. 01/05/15  Yes Historical Provider, MD  metFORMIN (GLUCOPHAGE) 1000 MG tablet Take 1 tablet (1,000 mg total) by mouth 2 (two) times daily with a meal. Patient not taking: Reported on 01/22/2015 12/15/12   Verlee Monte, MD  methotrexate (RHEUMATREX) 2.5 MG tablet Take 25 mg by mouth  once a week. Take 10 tablets by mouth every Monday 01/12/15  Yes Historical Provider, MD  metoprolol tartrate (LOPRESSOR) 25 MG tablet Take 1 tablet (25 mg total) by mouth 2 (two) times daily. Patient not taking: Reported on 01/22/2015 04/09/14   Debbe Odea, MD  promethazine (PHENERGAN) 25 MG tablet Take 25 mg by mouth every 6 (six) hours as needed for nausea or vomiting.  12/16/14  Yes Historical Provider, MD  SUMAtriptan (IMITREX) 50 MG tablet Take 1 tablet (50 mg total) by mouth every 2 (two) hours as needed for migraine. Patient not taking: Reported on 01/22/2015 12/15/12   Verlee Monte, MD  traMADol (ULTRAM) 50 MG tablet Take 50 mg by mouth 2 (two) times daily as needed for pain.  05/04/12  Yes Barbara Cower, MD  zolpidem (AMBIEN) 5 MG tablet Take 5 mg by mouth at bedtime as needed for sleep.  12/27/14  Yes Historical Provider, MD    No Known Allergies  Physical Exam  Vitals  Blood pressure 145/66, pulse 107, temperature 98.8 F (37.1 C), temperature source Oral, resp. rate 22, height _0  (1.575 m), weight 147 lb (66.679 kg), SpO2 95 %.   General:  In no acute distress, appears healthy and well nourished  Psych:  Normal affect, Denies Suicidal or Homicidal ideations, Awake Alert, Oriented X 3. Speech and thought patterns are clear and appropriate, she appears to  have some eye and her short term memory deficits  Neuro:   No focal neurological deficits, CN II through XII intact, Strength 5/5 all 4 extremities, Sensation grossly intact all 4 extremities.  ENT:  Ears and Eyes appear Normal, Conjunctivae clear, PER. Moist oral mucosa without erythema or exudates.  Neck:  Supple, No lymphadenopathy appreciated  Respiratory:  Symmetrical chest wall movement, Good air movement bilaterally, fine exp crackles. Room Air  Cardiac:  RRR, No Murmurs, no LE edema noted, no JVD, No carotid bruits, peripheral pulses palpable at 2+  Abdomen:  Positive bowel sounds, Soft, Non tender, Non distended,  No masses appreciated, no obvious hepatosplenomegaly  Skin:  No Cyanosis, Normal Skin Turgor, No Skin Rash or Bruise.  Extremities: Symmetrical without obvious trauma or injury,  no effusions.  Data Review  CBC  Recent Labs Lab 01/22/15 0641  WBC 9.9  HGB 12.5  HCT 38.4  PLT 288  MCV 92.3  MCH 30.0  MCHC 32.6  RDW 15.6*  LYMPHSABS 2.7  MONOABS 0.7  EOSABS 0.3  BASOSABS 0.0    Chemistries   Recent Labs Lab 01/22/15 0641 01/22/15 0729  NA 136  --   K 4.2  --   CL 99*  --   CO2 25  --   GLUCOSE 204*  --   BUN 11  --   CREATININE 1.14*  --   CALCIUM 9.5  --   AST  --  38  ALT  --  29  ALKPHOS  --  56  BILITOT  --  0.5    estimated creatinine clearance is 37.6 mL/min (by C-G formula based on Cr of 1.14).  No results for input(s): TSH, T4TOTAL, T3FREE, THYROIDAB in the last 72 hours.  Invalid input(s): FREET3  Coagulation profile No results for input(s): INR, PROTIME in the last 168 hours.  No results for input(s): DDIMER in the last 72 hours.  Cardiac Enzymes No results for input(s): CKMB, TROPONINI, MYOGLOBIN in the last 168 hours.  Invalid input(s): CK  Invalid input(s): POCBNP  Urinalysis    Component Value Date/Time  COLORURINE YELLOW 01/22/2015 0645   APPEARANCEUR CLEAR 01/22/2015 0645   LABSPEC 1.015 01/22/2015  0645   PHURINE 5.5 01/22/2015 0645   GLUCOSEU NEGATIVE 01/22/2015 0645   HGBUR NEGATIVE 01/22/2015 0645   BILIRUBINUR NEGATIVE 01/22/2015 0645   KETONESUR NEGATIVE 01/22/2015 0645   PROTEINUR NEGATIVE 01/22/2015 0645   UROBILINOGEN 0.2 01/22/2015 0645   NITRITE POSITIVE* 01/22/2015 0645   LEUKOCYTESUR MODERATE* 01/22/2015 0645    Imaging results:   Dg Chest 2 View  01/22/2015   CLINICAL DATA:  Midsternal chest pain and shortness of breath one week with weakness.  EXAM: CHEST  2 VIEW  COMPARISON:  04/07/2014 and chest CT 04/07/2014  FINDINGS: Lungs are hypoinflated with chronic linear density over the right lower lobe. No focal lobar consolidation, effusion or pneumothorax. Cardiomediastinal silhouette is within normal. Evidence of previous T11 and T12 kyphoplasties. Remainder of the exam is unchanged.  IMPRESSION: Hypoinflation without acute cardiopulmonary disease. Linear scarring right lower lobe.   Electronically Signed   By: Marin Olp M.D.   On: 01/22/2015 07:57   Ct Angio Chest Pe W/cm &/or Wo Cm  01/22/2015   CLINICAL DATA:  Chest pain.  Tachycardia  EXAM: CT ANGIOGRAPHY CHEST WITH CONTRAST  TECHNIQUE: Multidetector CT imaging of the chest was performed using the standard protocol during bolus administration of intravenous contrast. Multiplanar CT image reconstructions and MIPs were obtained to evaluate the vascular anatomy.  CONTRAST:  48m OMNIPAQUE IOHEXOL 350 MG/ML SOLN  COMPARISON:  04/07/2014  FINDINGS: Mediastinum: The trachea appears patent and is midline. Normal appearance of the esophagus. The main pulmonary artery is patent. No lobar or segmental pulmonary artery filling defects identified.  Lungs/Pleura: No pleural effusion identified. Platelike atelectasis identified within the lung bases. No airspace consolidation noted. Granuloma identified in the right upper lobe. Part solid nodule within the the lingula measures 11 mm, image 45/series 7.  Upper Abdomen: Mild diffuse  hepatic steatosis. Prior cholecystectomy. The adrenal glands are unremarkable.  Musculoskeletal: Treated compression fractures within the lower thoracic and upper lumbar spine noted.  Review of the MIP images confirms the above findings.  IMPRESSION: 1. No evidence for acute pulmonary embolus. 2. Sub solid nodule in the lingula measures 11 mm. This is new from previous exam. Recommend followup examination in 3 months to confirm persistence. This recommendation follows the consensus statement: Recommendations for the Management of Subsolid Pulmonary Nodules Detected at CT: A Statement from the [facility] [id number] 3. Prior granulomatous disease. 4. Hepatic steatosis.   Electronically Signed   By: TKerby MoorsM.D.   On: 01/22/2015 09:53   Mr Brain Wo Contrast  01/22/2015   CLINICAL DATA:  Ataxia.  Acute onset dizziness.  EXAM: MRI HEAD WITHOUT CONTRAST  TECHNIQUE: Multiplanar, multiecho pulse sequences of the brain and surrounding structures were obtained without intravenous contrast.  COMPARISON:  04/09/2014  FINDINGS: There is no evidence of acute infarct, intracranial hemorrhage, mass, midline shift, or extra-axial fluid collection. There is mild generalized cerebral atrophy, within normal limits for age and unchanged. Patchy T2 hyperintensities in the periventricular greater than subcortical cerebral white matter do not appear significantly changed and are compatible with mild chronic small vessel ischemic disease.  Orbits are unremarkable. Right maxillary sinus fluid has decreased from the prior study. Mastoid air cells are clear. Major intracranial vascular flow voids are preserved.  IMPRESSION: 1. No acute intracranial abnormality. 2. Mild chronic small vessel ischemic disease.   Electronically Signed   By: ALogan Bores  On: 01/22/2015  11:40     EKG: (Independently reviewed) sinus tachycardia with LAFB, no acute ST or T-wave changes that would be concerning for scheme EN EKG  is actually unchanged from previous EKG in Sept 2015   Assessment & Plan  Principal Problem:   Elevated lactic acid level -Admit to telemetry -Despite having abnormal urinalysis patient does not meet sepsis criteria and does not have sepsis physiology but as precaution will work up as sepsis utilizing focused protocol -Cycle lactic acid and follow-up on Procalcitonin -Follow-up on all cultures -Empiric anabiotic's for urinary tract infection/see below -Suspect elevated lactic acid either related to suspected dehydration in setting of preadmission use of diuretics or possibly related to side effects from offending medications noting patient was on statin and +/-was on metformin prior to admission (she is reporting diffuse myalgias) -Check CPK  Active Problems:   UTI (lower urinary tract infection) -Does not have fever or white count but does have dysuria and a sensation of incomplete bladder emptying -May have a component of diabetic neurogenic bladder -Empiric Rocephin -Follow up on cultures    Disequilibrium syndrome -Suspect undiagnosed peripheral neuropathy -As precaution check orthostatic vital signs although in the past have been negative -PT and OT evaluations    DM (diabetes mellitus) -Unclear which medications patient actually taking at home noting has been on Lantus as well as metformin in the past -I have reordered Amaryl and Januvia pending pharmacist review -Check CBGs and provide sliding scale insulin -Check hemoglobin A1c    Chronic diastolic heart failure, NYHA class 1/ HTN -Currently compensated without evidence of acute heart failure noting not hypoxemic at rest, no peripheral edema, no edema on CT angiogram of the chest -Unclear why patient reporting chronic and progressive dyspnea on exertion; we'll check room air ambulatory pulse oximetry reading in the event patient has a degree of deconditioning leading to exertional hypoxemia -does have mild crackles on exam  so will give 1x dose IV Lasix and monitor response -pt did use albuterol MDI several times prior to onset of current sx's and this seemed to precipitate panic attack type symptoms    Rheumatoid arthritis -Myalgias reported could also be related to rheumatoid arthritis flare so checking CRP and ESR -Patient on once weekly Rheumatrex every Monday    Tachycardia with 100 - 120 beats per minute -This is a chronic and ongoing problem -It appears she previously had been on beta blockers    DVT Prophylaxis: Lovenox  Family Communication:   Daughter at bedside  Code Status: Full code   Condition:  Stable  Discharge disposition: Anticipate discharge back to home once lactic acid normalized and disequilibrium symptoms quantified and managed  Time spent in minutes : 60      ELLIS,ALLISON L. ANP on 01/22/2015 at 1:45 PM  Between 7am to 7pm - Pager - [phone]  After 7pm go to [email or web address] - password [email or web address]  And look for the night coverage person covering me after hours  [facility]  I have taken an interval history, reviewed the chart and examined the patient.  79 year old female with a history of diabetes, rheumatoid arthritis, hypertension, chronic bronchitis who was started on albuterol by her PCP on Thursday, patient used albuterol inhaler 3 times a day as prescribed. And she developed palpitations, anxiety, disequilibrium more like a panic attack from albuterol use. Patient also complains of dyspnea on exertion, orthopnea, paroxysmal nocturnal dyspnea. She has a history of diastolic heart failure. And on exam she has bilateral crackles at the lung  bases. We'll start the patient on low-dose Lasix for diastolic heart failure. Will closely watch renal functions. I agree with the Advanced Practice Provider's note, impression and recommendations. I have made any necessary editorial changes.

## 2015-01-22 NOTE — ED Notes (Signed)
States I have been weak for about a week and I think I have a bladder infection.  Having urgency with urination.  Hx of arthritis.

## 2015-01-22 NOTE — ED Notes (Signed)
Pt returned from CT, monitoring reapplied.

## 2015-01-22 NOTE — ED Provider Notes (Signed)
CSN: [id number]     Arrival date & time 01/22/15  5284 History   First MD Initiated Contact with Patient 01/22/15 [phone]     Chief Complaint  Patient presents with  . Generalized Body Aches     (Consider location/radiation/quality/duration/timing/severity/associated sxs/prior Treatment) HPI Comments: The pt is a 79 y/o female who has DM and RA, presents with generalized weakness, some SOB (at night), dysuria - arthralgias - ongoing for 1 week, gradually worsening, and now severe - states that she can't walk well b/c of feeling off balance but denies focal weakness.  She has had no evaluation by a physician for these complaints.  She denies coughing, she denies shortness of breath during the day or with exertion but states that at night she has some left-sided chest discomfort and shortness of breath which resolves with sitting up and taking Tums. There has been no swelling of the legs or the joints, no rashes, no diarrhea, her appetite remains normal according to the patient. Her blood sugar has been measured at approximately 200 this morning  The history is provided by the patient.    Past Medical History  Diagnosis Date  . Hypertension   . Chronic bronchitis     "get it just about q yr" (04/07/2014)  . Anginal pain   . Exertional shortness of breath   . High cholesterol   . Type II diabetes mellitus   . GERD (gastroesophageal reflux disease)   . Headache(784.0)     "once or twice/week" (04/07/2014)  . Arthritis     "all over my body"    Past Surgical History  Procedure Laterality Date  . Back surgery    . Cholecystectomy  1990's  . Appendectomy  1969  . Lumbar disc surgery      "slipped disc" (12/11/2012)  . Shoulder surgery Left ~ 2009    "put cement down in it"   . Vaginal hysterectomy  ~ 1973  . Ectopic pregnancy surgery  1969   No family history on file. History  Substance Use Topics  . Smoking status: Former Smoker    Types: Cigarettes  . Smokeless tobacco: Never Used      Comment: 04/07/2014"1 pack of cigarettes would last 3-4 months; haven't had any cigarettes for 40 years"  . Alcohol Use: No   OB History    No data available     Review of Systems  All other systems reviewed and are negative.     Allergies  Review of patient's allergies indicates no known allergies.  Home Medications   Prior to Admission medications   Medication Sig Start Date End Date Taking? Authorizing Provider  albuterol (PROVENTIL HFA;VENTOLIN HFA) 108 (90 BASE) MCG/ACT inhaler Inhale 2 puffs into the lungs every 6 (six) hours as needed for wheezing or shortness of breath.   Yes Historical Provider, MD  aspirin EC 81 MG tablet Take 1 tablet (81 mg total) by mouth daily. Patient not taking: Reported on 01/22/2015 04/09/14   Debbe Odea, MD  Aspirin-Salicylamide-Caffeine (BC HEADACHE POWDER PO) Take 1 packet by mouth daily as needed (for pain).    Yes Historical Provider, MD  atorvastatin (LIPITOR) 20 MG tablet Take 20 mg by mouth every morning.  01/11/15  Yes Historical Provider, MD  diazepam (VALIUM) 5 MG tablet take 1 tablet by mouth once daily if needed 01/05/15  Yes Historical Provider, MD  fluticasone (FLONASE) 50 MCG/ACT nasal spray instill 2 sprays into each nostril once daily 01/05/15  Yes Historical Provider, MD  folic acid (FOLVITE) 1 MG tablet Take 1 mg by mouth daily.   Yes Historical Provider, MD  glimepiride (AMARYL) 2 MG tablet Take 2 mg by mouth daily with breakfast.  01/11/15  Yes Historical Provider, MD  insulin glargine (LANTUS) 100 units/mL SOLN Inject 10 Units into the skin daily at 10 pm. Patient not taking: Reported on 01/22/2015 12/15/12   Verlee Monte, MD  JANUVIA 100 MG tablet Take 100 mg by mouth daily.  01/11/15  Yes Historical Provider, MD  lisinopril-hydrochlorothiazide (PRINZIDE,ZESTORETIC) 20-12.5 MG per tablet Take 1 tablet by mouth daily. 01/05/15  Yes Historical Provider, MD  metFORMIN (GLUCOPHAGE) 1000 MG tablet Take 1 tablet (1,000 mg total) by mouth  2 (two) times daily with a meal. Patient not taking: Reported on 01/22/2015 12/15/12   Verlee Monte, MD  methotrexate (RHEUMATREX) 2.5 MG tablet Take 25 mg by mouth once a week. Take 10 tablets by mouth every Monday 01/12/15  Yes Historical Provider, MD  metoprolol tartrate (LOPRESSOR) 25 MG tablet Take 1 tablet (25 mg total) by mouth 2 (two) times daily. Patient not taking: Reported on 01/22/2015 04/09/14   Debbe Odea, MD  promethazine (PHENERGAN) 25 MG tablet Take 25 mg by mouth every 6 (six) hours as needed for nausea or vomiting.  12/16/14  Yes Historical Provider, MD  SUMAtriptan (IMITREX) 50 MG tablet Take 1 tablet (50 mg total) by mouth every 2 (two) hours as needed for migraine. Patient not taking: Reported on 01/22/2015 12/15/12   Verlee Monte, MD  traMADol (ULTRAM) 50 MG tablet Take 50 mg by mouth 2 (two) times daily as needed for pain.  05/04/12  Yes Barbara Cower, MD  zolpidem (AMBIEN) 5 MG tablet Take 5 mg by mouth at bedtime as needed for sleep.  12/27/14  Yes Historical Provider, MD   BP 131/69 mmHg  Pulse 109  Temp(Src) 98.8 F (37.1 C) (Oral)  Resp 20  Ht 5\' 2"  (1.575 m)  Wt 147 lb (66.679 kg)  BMI 26.88 kg/m2  SpO2 96% Physical Exam  Constitutional: She appears well-developed and well-nourished. No distress.  Generally weak but no acute distress  HENT:  Head: Normocephalic and atraumatic.  Mouth/Throat: No oropharyngeal exudate.  Mucous membranes dehydrated  Eyes: Conjunctivae and EOM are normal. Pupils are equal, round, and reactive to light. Right eye exhibits no discharge. Left eye exhibits no discharge. No scleral icterus.  Neck: Normal range of motion. Neck supple. No JVD present. No thyromegaly present.  Cardiovascular: Regular rhythm, normal heart sounds and intact distal pulses.  Exam reveals no gallop and no friction rub.   No murmur heard. Tachycardic to 125, strong pulses at radial arteries, no JVD  Pulmonary/Chest: Effort normal and breath sounds normal. No  respiratory distress. She has no wheezes. She has no rales.  Decreased lung sounds at the bases, speaks in full sentences, no distress  Abdominal: Soft. Bowel sounds are normal. She exhibits no distension and no mass. There is no tenderness.  Musculoskeletal: Normal range of motion. She exhibits no edema or tenderness.  Lymphadenopathy:    She has no cervical adenopathy.  Neurological: She is alert. Coordination normal.  Generally weak, difficulty sitting up in the bed by herself, normal coordination, normal strength in all 4 extremities when isolated, cranial nerves III through XII intact  Skin: Skin is warm and dry. No rash noted. No erythema.  Psychiatric: She has a normal mood and affect. Her behavior is normal.  Nursing note and vitals reviewed.   ED Course  Procedures (including critical care time) Labs Review Labs Reviewed  CBC WITH DIFFERENTIAL/PLATELET - Abnormal; Notable for the following:    RDW 15.6 (*)    All other components within normal limits  BASIC METABOLIC PANEL - Abnormal; Notable for the following:    Chloride 99 (*)    Glucose, Bld 204 (*)    Creatinine, Ser 1.14 (*)    GFR calc non Af Amer 46 (*)    GFR calc Af Amer 53 (*)    All other components within normal limits  URINALYSIS, ROUTINE W REFLEX MICROSCOPIC (NOT AT [facility]) - Abnormal; Notable for the following:    Nitrite POSITIVE (*)    Leukocytes, UA MODERATE (*)    All other components within normal limits  HEPATIC FUNCTION PANEL - Abnormal; Notable for the following:    Total Protein 6.4 (*)    Albumin 3.2 (*)    Bilirubin, Direct <0.1 (*)    All other components within normal limits  URINE MICROSCOPIC-ADD ON - Abnormal; Notable for the following:    Bacteria, UA FEW (*)    Casts HYALINE CASTS (*)    All other components within normal limits  CBG MONITORING, ED - Abnormal; Notable for the following:    Glucose-Capillary 144 (*)    All other components within normal limits  I-STAT CG4 LACTIC ACID,  ED - Abnormal; Notable for the following:    Lactic Acid, Venous 3.14 (*)    All other components within normal limits  URINE CULTURE  CK  SEDIMENTATION RATE  C-REACTIVE PROTEIN  I-STAT TROPOININ, ED  I-STAT CG4 LACTIC ACID, ED    Imaging Review Dg Chest 2 View  01/22/2015   CLINICAL DATA:  Midsternal chest pain and shortness of breath one week with weakness.  EXAM: CHEST  2 VIEW  COMPARISON:  04/07/2014 and chest CT 04/07/2014  FINDINGS: Lungs are hypoinflated with chronic linear density over the right lower lobe. No focal lobar consolidation, effusion or pneumothorax. Cardiomediastinal silhouette is within normal. Evidence of previous T11 and T12 kyphoplasties. Remainder of the exam is unchanged.  IMPRESSION: Hypoinflation without acute cardiopulmonary disease. Linear scarring right lower lobe.   Electronically Signed   By: Marin Olp M.D.   On: 01/22/2015 07:57   Ct Angio Chest Pe W/cm &/or Wo Cm  01/22/2015   CLINICAL DATA:  Chest pain.  Tachycardia  EXAM: CT ANGIOGRAPHY CHEST WITH CONTRAST  TECHNIQUE: Multidetector CT imaging of the chest was performed using the standard protocol during bolus administration of intravenous contrast. Multiplanar CT image reconstructions and MIPs were obtained to evaluate the vascular anatomy.  CONTRAST:  33mL OMNIPAQUE IOHEXOL 350 MG/ML SOLN  COMPARISON:  04/07/2014  FINDINGS: Mediastinum: The trachea appears patent and is midline. Normal appearance of the esophagus. The main pulmonary artery is patent. No lobar or segmental pulmonary artery filling defects identified.  Lungs/Pleura: No pleural effusion identified. Platelike atelectasis identified within the lung bases. No airspace consolidation noted. Granuloma identified in the right upper lobe. Part solid nodule within the the lingula measures 11 mm, image 45/series 7.  Upper Abdomen: Mild diffuse hepatic steatosis. Prior cholecystectomy. The adrenal glands are unremarkable.  Musculoskeletal: Treated  compression fractures within the lower thoracic and upper lumbar spine noted.  Review of the MIP images confirms the above findings.  IMPRESSION: 1. No evidence for acute pulmonary embolus. 2. Sub solid nodule in the lingula measures 11 mm. This is new from previous exam. Recommend followup examination in 3 months to confirm persistence. This  recommendation follows the consensus statement: Recommendations for the Management of Subsolid Pulmonary Nodules Detected at CT: A Statement from the [place]. Radiology 3818;[id number]. 3. Prior granulomatous disease. 4. Hepatic steatosis.   Electronically Signed   By: Kerby Moors M.D.   On: 01/22/2015 09:53   Mr Brain Wo Contrast  01/22/2015   CLINICAL DATA:  Ataxia.  Acute onset dizziness.  EXAM: MRI HEAD WITHOUT CONTRAST  TECHNIQUE: Multiplanar, multiecho pulse sequences of the brain and surrounding structures were obtained without intravenous contrast.  COMPARISON:  04/09/2014  FINDINGS: There is no evidence of acute infarct, intracranial hemorrhage, mass, midline shift, or extra-axial fluid collection. There is mild generalized cerebral atrophy, within normal limits for age and unchanged. Patchy T2 hyperintensities in the periventricular greater than subcortical cerebral white matter do not appear significantly changed and are compatible with mild chronic small vessel ischemic disease.  Orbits are unremarkable. Right maxillary sinus fluid has decreased from the prior study. Mastoid air cells are clear. Major intracranial vascular flow voids are preserved.  IMPRESSION: 1. No acute intracranial abnormality. 2. Mild chronic small vessel ischemic disease.   Electronically Signed   By: Logan Bores   On: 01/22/2015 11:40     EKG Interpretation   Date/Time:  Sunday January 22 2015 06:38:21 EDT Ventricular Rate:  120 PR Interval:  143 QRS Duration: 91 QT Interval:  336 QTC Calculation: 475 R Axis:   -70 Text Interpretation:  Sinus tachycardia Left  anterior fascicular block  Consider right ventricular hypertrophy since last tracing no significant  change Confirmed by Shaquna Geigle  MD, Elis Sauber ([id number]) on 01/22/2015 7:09:28 AM      MDM   Final diagnoses:  SOB (shortness of breath)  Tachycardia  Ataxia    The patient has some pain with range of motion of joints diffusely but has no red or inflamed joints, her vital signs are abnormal with tachycardia, her EKG shows a sinus tachycardia but no significant changes, we'll obtain a chest x-ray and other blood work to further evaluate for the source of her weakness though this could've a urinary infection I would also consider pulmonary pathology. Labs pending. IV fluids ordered.  CT scan negative for pulmonary embolus or pneumonia, continues to be tachycardic, ambulates with the patient, she is unable to walk straight without assistance, MRI negative for stroke, repeat lactate is increased despite getting fluids and IV at about it. She will need to be admitted to the hospital for worsening symptoms despite no evidence of sepsis other than tachycardia, discussed with the hospitalist who will see the patient in the emergency department for admission.  I have personally seen and viewed and interpreted the images and agree with the radiologist interpretation  Meds given in ED:  Medications  sodium chloride 0.9 % bolus 1,000 mL (0 mLs Intravenous Stopped 01/22/15 0919)  cefTRIAXone (ROCEPHIN) 1 g in dextrose 5 % 50 mL IVPB (0 g Intravenous Stopped 01/22/15 1000)  iohexol (OMNIPAQUE) 350 MG/ML injection 80 mL (80 mLs Intravenous Contrast Given 01/22/15 0917)     Noemi Chapel, MD 01/22/15 1244

## 2015-01-22 NOTE — ED Notes (Signed)
Patient transported to X-ray 

## 2015-01-22 NOTE — ED Notes (Signed)
  Pt transported to ct 

## 2015-01-23 DIAGNOSIS — E872 Acidosis: Secondary | ICD-10-CM

## 2015-01-23 LAB — CBC
HCT: 40.1 % (ref 36.0–46.0)
HEMOGLOBIN: 12.8 g/dL (ref 12.0–15.0)
MCH: 29.8 pg (ref 26.0–34.0)
MCHC: 31.9 g/dL (ref 30.0–36.0)
MCV: 93.5 fL (ref 78.0–100.0)
Platelets: 284 10*3/uL (ref 150–400)
RBC: 4.29 MIL/uL (ref 3.87–5.11)
RDW: 15.9 % — ABNORMAL HIGH (ref 11.5–15.5)
WBC: 7.6 10*3/uL (ref 4.0–10.5)

## 2015-01-23 LAB — BASIC METABOLIC PANEL
ANION GAP: 9 (ref 5–15)
CALCIUM: 9 mg/dL (ref 8.9–10.3)
CO2: 27 mmol/L (ref 22–32)
CREATININE: 0.78 mg/dL (ref 0.44–1.00)
Chloride: 104 mmol/L (ref 101–111)
GFR calc Af Amer: >60 mL/min (ref >60)
GFR calc non Af Amer: >60 mL/min (ref >60)
Glucose, Bld: 152 mg/dL — ABNORMAL HIGH (ref 65–99)
Potassium: 3.8 mmol/L (ref 3.5–5.1)
Sodium: 140 mmol/L (ref 135–145)

## 2015-01-23 LAB — GLUCOSE, CAPILLARY
GLUCOSE-CAPILLARY: 169 mg/dL — AB (ref 65–99)
Glucose-Capillary: 139 mg/dL — ABNORMAL HIGH (ref 65–99)
Glucose-Capillary: 116 mg/dL — ABNORMAL HIGH (ref 65–99)
Glucose-Capillary: 116 mg/dL — ABNORMAL HIGH (ref 65–99)

## 2015-01-23 LAB — HEMOGLOBIN A1C
HEMOGLOBIN A1C: 8.4 % — AB (ref 4.8–5.6)
MEAN PLASMA GLUCOSE: 194 mg/dL

## 2015-01-23 MED ORDER — POLYETHYLENE GLYCOL 3350 17 G PO PACK
17.0000 g | PACK | Freq: Two times a day (BID) | ORAL | Status: AC
  Administered 2015-01-23 (×2): 17 g via ORAL
  Filled 2015-01-23 (×2): qty 1

## 2015-01-23 MED ORDER — SODIUM CHLORIDE 0.9 % IV SOLN
INTRAVENOUS | Status: DC
  Administered 2015-01-23 (×2): via INTRAVENOUS

## 2015-01-23 MED ORDER — BISACODYL 5 MG PO TBEC
10.0000 mg | DELAYED_RELEASE_TABLET | Freq: Two times a day (BID) | ORAL | Status: DC
  Administered 2015-01-23 – 2015-01-24 (×2): 10 mg via ORAL
  Filled 2015-01-23 (×2): qty 2

## 2015-01-23 NOTE — Evaluation (Signed)
Physical Therapy Evaluation Patient Details Name: [name] MRN: [id number] DOB: 03-04-1936 Today's Date: 01/23/2015   History of Present Illness  79 year old female patient with known diabetes, rheumatoid arthritis on chronic immunosuppression, hypertension, chronic bronchitis, chronic dyspnea on exertion and dyslipidemi. Presents with generalized weakness, unsteady gait, workup was significant for lactic acidosis, and UTI.  Clinical Impression  Pt admitted with above diagnosis. Pt currently with functional limitations due to the deficits listed below (see PT Problem List).  Pt will benefit from skilled PT to increase their independence and safety with mobility to allow discharge to the venue listed below.    Noted consistent L list with RW walking in hallway; worth checking her vision a closer next session.     Follow Up Recommendations Home health PT    Equipment Recommendations  3in1 (PT);Other (comment) (tub bench (will defer to OT))    Recommendations for Other Services       Precautions / Restrictions Precautions Precautions: Fall Restrictions Weight Bearing Restrictions: No      Mobility  Bed Mobility Overal bed mobility: Modified Independent             General bed mobility comments: extra time and using bed rails  Transfers Overall transfer level: Needs assistance Equipment used: None Transfers: Sit to/from Stand Sit to Stand: Supervision         General transfer comment: Supervision for safety  Ambulation/Gait Ambulation/Gait assistance: Min assist Ambulation Distance (Feet): 120 Feet Assistive device: Rolling walker (2 wheeled) Gait Pattern/deviations: Drifts right/left (Left) Gait velocity: slowed   General Gait Details: Benefits from using RW for UE support; noted tendency ot list L with RW; min assist to keep on a straight path  Stairs            Wheelchair Mobility    Modified Rankin (Stroke Patients Only)       Balance  Overall balance assessment: Needs assistance Sitting-balance support: No upper extremity supported;Feet supported Sitting balance-Leahy Scale: Good     Standing balance support: No upper extremity supported;During functional activity Standing balance-Leahy Scale: Fair                               Pertinent Vitals/Pain Pain Assessment: No/denies pain    Home Living Family/patient expects to be discharged to:: Private residence Living Arrangements: Alone Available Help at Discharge: Family;Available PRN/intermittently (niece and nephew) Type of Home: House Home Access: Stairs to enter Entrance Stairs-Rails: Can reach both;Right;Left Entrance Stairs-Number of Steps: 3 Home Layout: One level Home Equipment: Environmental consultant - 2 wheels Additional Comments: requesting a commode seat and shower bench, walker is a donated item from another family member    Prior Function Level of Independence: Independent         Comments: Finding she needs her walker more lately     Hand Dominance   Dominant Hand: Right    Extremity/Trunk Assessment   Upper Extremity Assessment: Defer to OT evaluation           Lower Extremity Assessment: Generalized weakness      Cervical / Trunk Assessment: Normal  Communication   Communication: No difficulties  Cognition Arousal/Alertness: Awake/alert Behavior During Therapy: WFL for tasks assessed/performed Overall Cognitive Status: Within Functional Limits for tasks assessed                      General Comments      Exercises  Assessment/Plan    PT Assessment Patient needs continued PT services  PT Diagnosis Difficulty walking;Generalized weakness   PT Problem List Decreased strength;Decreased activity tolerance;Decreased balance;Decreased mobility;Decreased coordination;Decreased safety awareness  PT Treatment Interventions DME instruction;Gait training;Stair training;Functional mobility training;Therapeutic  activities;Therapeutic exercise;Balance training;Patient/family education   PT Goals (Current goals can be found in the Care Plan section) Acute Rehab PT Goals Patient Stated Goal: go home PT Goal Formulation: With patient Time For Goal Achievement: 02/06/15 Potential to Achieve Goals: Good    Frequency Min 3X/week   Barriers to discharge Decreased caregiver support Must be modified independent to dc home    Co-evaluation               End of Session Equipment Utilized During Treatment: Gait belt Activity Tolerance: Patient tolerated treatment well Patient left: in bed;with call bell/phone within reach Nurse Communication: Mobility status         Time: 6962-9528 PT Time Calculation (min) (ACUTE ONLY): 22 min   Charges:   PT Evaluation $Initial PT Evaluation Tier I: 1 Procedure     PT G CodesQuin Spencer 01/23/2015, 4:15 PM  [name], [place] Pager [phone] Office [phone]

## 2015-01-23 NOTE — Progress Notes (Signed)
Occupational Therapy Evaluation Patient Details Name: [name] MRN: [id number] DOB: [birth year] Today's Date: 01/23/2015    History of Present Illness 79 year old female patient with known diabetes, rheumatoid arthritis on chronic immunosuppression, hypertension, chronic bronchitis, chronic dyspnea on exertion and dyslipidemi. Presents with generalized weakness, unsteady gait, workup was significant for lactic acidosis, and UTI.   Clinical Impression   Patient presenting with deconditioning. Patient independent PTA. Patient currently functioning at an overall supervision level. Patient will benefit from acute OT to increase overall independence in the areas of ADLs, functional mobility, and overall safety in order to safely discharge home with intermittent supervision from niece and nephew.     Follow Up Recommendations  No OT follow up;Supervision - Intermittent    Equipment Recommendations  3 in 1 bedside comode    Recommendations for Other Services  None at this time   Precautions / Restrictions Precautions Precautions: Fall Restrictions Weight Bearing Restrictions: No      Mobility Bed Mobility Overal bed mobility: Modified Independent General bed mobility comments: extra time and using bed rails  Transfers Overall transfer level: Needs assistance Equipment used: None Transfers: Sit to/from Stand Sit to Stand: Supervision General transfer comment: Supervision for safety    Balance Overall balance assessment: Needs assistance Sitting-balance support: No upper extremity supported;Feet supported Sitting balance-Leahy Scale: Good     Standing balance support: No upper extremity supported;During functional activity Standing balance-Leahy Scale: Fair    ADL Overall ADL's : Needs assistance/impaired General ADL Comments: Pt overall supervision for ADLs at this time. Pt requesting a BSC due to weak and painful knees when trying to sit and stand from toielt.  Educated pt that she can use BSC in shower as well to assist with shower safely.      Vision Additional Comments: no change from baseline          Pertinent Vitals/Pain Pain Assessment: No/denies pain     Hand Dominance Right   Extremity/Trunk Assessment Upper Extremity Assessment Upper Extremity Assessment: Overall WFL for tasks assessed   Lower Extremity Assessment Lower Extremity Assessment: Defer to PT evaluation   Cervical / Trunk Assessment Cervical / Trunk Assessment: Normal   Communication Communication Communication: No difficulties   Cognition Arousal/Alertness: Awake/alert Behavior During Therapy: WFL for tasks assessed/performed Overall Cognitive Status: Within Functional Limits for tasks assessed             Home Living Family/patient expects to be discharged to:: Private residence Living Arrangements: Alone Available Help at Discharge: Family;Available PRN/intermittently (niece and nephew) Type of Home: House Home Access: Stairs to enter Technical brewer of Steps: 3 Entrance Stairs-Rails: Can reach both;Right;Left Home Layout: One level     Bathroom Shower/Tub: Corporate investment banker: Standard   Prior Functioning/Environment Level of Independence: Independent     OT Diagnosis: Generalized weakness   OT Problem List: Decreased strength;Decreased activity tolerance;Impaired balance (sitting and/or standing);Decreased safety awareness;Decreased knowledge of use of DME or AE   OT Treatment/Interventions: Self-care/ADL training;Therapeutic exercise;Energy conservation;DME and/or AE instruction;Therapeutic activities;Patient/family education;Balance training    OT Goals(Current goals can be found in the care plan section) Acute Rehab OT Goals Patient Stated Goal: go home OT Goal Formulation: With patient Time For Goal Achievement: 01/30/15 Potential to Achieve Goals: Good ADL Goals Pt Will Perform Grooming:  standing;Independently Pt Will Transfer to Toilet: with modified independence;bedside commode;ambulating Pt Will Perform Toileting - Clothing Manipulation and hygiene: sit to/from stand;with modified independence Pt Will Perform Tub/Shower Transfer: Tub transfer;with modified independence;3 in  1;ambulating  OT Frequency: Min 2X/week   Barriers to D/C: Decreased caregiver support   End of Session Activity Tolerance: Patient tolerated treatment well Patient left: in chair;with call bell/phone within reach   Time: 1310-1330 OT Time Calculation (min): 20 min Charges:  OT General Charges $OT Visit: 1 Procedure OT Evaluation $Initial OT Evaluation Tier I: 1 Procedure  Zuhayr Deeney., MS, OTR/L, CLT Pager: [phone]  01/23/2015, 1:44 PM

## 2015-01-23 NOTE — Progress Notes (Signed)
Patient Demographics  [name], is a 79 y.o. female, DOB - 05/01/1936, VXY:[id number]  Admit date - 01/22/2015   Admitting Physician Oswald Hillock, MD  Outpatient Primary MD for the patient is [name], MD  LOS - 1   Chief Complaint  Patient presents with  . Generalized Body Aches       Admission HPI/Brief narrative: 78 year old female patient with known diabetes, rheumatoid arthritis on chronic immunosuppression, hypertension, chronic bronchitis,  chronic dyspnea on exertion and dyslipidemia . Presents with generalized weakness, unsteady gait, workup was significant for lactic acidosis, and UTI.  Subjective:   [name] today has, No headache, No chest pain, No abdominal pain - No Nausea, No Cough , reports her generalized weakness much improved, reports shortness of breath which is at baseline for her.  Assessment & Plan    Principal Problem:   Elevated lactic acid level Active Problems:   UTI (lower urinary tract infection)   Rheumatoid arthritis   Tachycardia with 100 - 120 beats per minute   DM (diabetes mellitus)   Disequilibrium syndrome   Chronic diastolic heart failure, NYHA class 1  UTI - Continue with Rocephin, follow  urine culture  Unsteady gait, generalized weakness - We'll consult PT - Patient is not orthostatic - MRI brain with no acute finding   Diabetes mellitus - Continue with Amaryl and Januvia, and insulin sliding scale, hold metformin given lactic acidosis. - Glycohemoglobin A1c is 8.4  Lactic acidosis - Will stop metformin  Chronic diastolic CHF - Compensated  Rheumatoid arthritis - Continue with Rheumatrex every Monday  Tachycardia - CT chest angiography negative for PE  Incidental finding of pulmonary nodule and CT chest - patient  will need repeat CT chest in 3 months.  Code Status: Full  Family Communication: none at  bedside  Disposition Plan: home when stable   Procedures  none   Consults   none   Medications  Scheduled Meds: . bisacodyl  10 mg Oral BID  . cefTRIAXone (ROCEPHIN)  IV  2 g Intravenous Q24H  . enoxaparin (LOVENOX) injection  40 mg Subcutaneous Q24H  . fluticasone  2 spray Each Nare Daily  . folic acid  1 mg Oral Daily  . glimepiride  2 mg Oral Q breakfast  . hydrochlorothiazide  12.5 mg Oral Daily  . insulin aspart  0-15 Units Subcutaneous TID WC  . insulin aspart  0-5 Units Subcutaneous QHS  . linagliptin  5 mg Oral Daily  . lisinopril  20 mg Oral Daily  . methotrexate  25 mg Oral Weekly  . polyethylene glycol  17 g Oral BID  . sodium chloride  3 mL Intravenous Q12H   Continuous Infusions: . sodium chloride 50 mL/hr at 01/23/15 0830   PRN Meds:.acetaminophen **OR** acetaminophen, albuterol, alum & mag hydroxide-simeth, diazepam, ondansetron **OR** ondansetron (ZOFRAN) IV, traMADol, zolpidem  DVT Prophylaxis  Lovenox -  Lab Results  Component Value Date   PLT 284 01/23/2015    Antibiotics    Anti-infectives    Start     Dose/Rate Route Frequency Ordered Stop   01/23/15 0900  cefTRIAXone (ROCEPHIN) 2 g in dextrose 5 % 50 mL IVPB     2 g 100 mL/hr over 30 Minutes Intravenous  Every 24 hours 01/22/15 1256     01/22/15 1245  cefTRIAXone (ROCEPHIN) 2 g in dextrose 5 % 50 mL IVPB  Status:  Discontinued     2 g 100 mL/hr over 30 Minutes Intravenous  Once 01/22/15 1244 01/22/15 1255   01/22/15 0900  cefTRIAXone (ROCEPHIN) 1 g in dextrose 5 % 50 mL IVPB     1 g 100 mL/hr over 30 Minutes Intravenous  Once 01/22/15 0847 01/22/15 1000          Objective:   Filed Vitals:   01/22/15 2023 01/22/15 2154 01/23/15 0501 01/23/15 0813  BP: 140/71  128/50 118/66  Pulse: 127  124 100  Temp: 100.3 F (37.9 C) 98.9 F (37.2 C) 98.9 F (37.2 C) 98.9 F (37.2 C)  TempSrc: Oral Oral Oral Oral  Resp: 18  17 17   Height:      Weight: 67.3 kg (148 lb 5.9 oz)     SpO2:  93%  99% 92%    Wt Readings from Last 3 Encounters:  01/22/15 67.3 kg (148 lb 5.9 oz)  06/19/14 65.318 kg (144 lb)  04/09/14 67.644 kg (149 lb 2.1 oz)     Intake/Output Summary (Last 24 hours) at 01/23/15 1245 Last data filed at 01/23/15 1108  Gross per 24 hour  Intake    600 ml  Output      0 ml  Net    600 ml     Physical Exam  Awake Alert, Oriented X 3, No new F.N deficits, Normal affect [place].AT,PERRAL Supple Neck,No JVD, No cervical lymphadenopathy appriciated.  Symmetrical Chest wall movement, Good air movement bilaterally,  No Gallops,Rubs or new Murmurs, No Parasternal Heave +ve B.Sounds, Abd Soft, No tenderness, No organomegaly appriciated, No rebound - guarding or rigidity. No Cyanosis, Clubbing or edema, No new Rash or bruise     Data Review   Micro Results Recent Results (from the past 240 hour(s))  Culture, blood (routine x 2)     Status: None (Preliminary result)   Collection Time: 01/22/15  7:29 AM  Result Value Ref Range Status   Specimen Description BLOOD RIGHT ANTECUBITAL  Final   Special Requests BOTTLES DRAWN AEROBIC AND ANAEROBIC 5CC  Final   Culture PENDING  Incomplete   Report Status PENDING  Incomplete    Radiology Reports Dg Chest 2 View  01/22/2015   CLINICAL DATA:  Midsternal chest pain and shortness of breath one week with weakness.  EXAM: CHEST  2 VIEW  COMPARISON:  04/07/2014 and chest CT 04/07/2014  FINDINGS: Lungs are hypoinflated with chronic linear density over the right lower lobe. No focal lobar consolidation, effusion or pneumothorax. Cardiomediastinal silhouette is within normal. Evidence of previous T11 and T12 kyphoplasties. Remainder of the exam is unchanged.  IMPRESSION: Hypoinflation without acute cardiopulmonary disease. Linear scarring right lower lobe.   Electronically Signed   By: Marin Olp M.D.   On: 01/22/2015 07:57   Ct Angio Chest Pe W/cm &/or Wo Cm  01/22/2015   CLINICAL DATA:  Chest pain.  Tachycardia  EXAM: CT  ANGIOGRAPHY CHEST WITH CONTRAST  TECHNIQUE: Multidetector CT imaging of the chest was performed using the standard protocol during bolus administration of intravenous contrast. Multiplanar CT image reconstructions and MIPs were obtained to evaluate the vascular anatomy.  CONTRAST:  45mL OMNIPAQUE IOHEXOL 350 MG/ML SOLN  COMPARISON:  04/07/2014  FINDINGS: Mediastinum: The trachea appears patent and is midline. Normal appearance of the esophagus. The main pulmonary artery is patent. No lobar  or segmental pulmonary artery filling defects identified.  Lungs/Pleura: No pleural effusion identified. Platelike atelectasis identified within the lung bases. No airspace consolidation noted. Granuloma identified in the right upper lobe. Part solid nodule within the the lingula measures 11 mm, image 45/series 7.  Upper Abdomen: Mild diffuse hepatic steatosis. Prior cholecystectomy. The adrenal glands are unremarkable.  Musculoskeletal: Treated compression fractures within the lower thoracic and upper lumbar spine noted.  Review of the MIP images confirms the above findings.  IMPRESSION: 1. No evidence for acute pulmonary embolus. 2. Sub solid nodule in the lingula measures 11 mm. This is new from previous exam. Recommend followup examination in 3 months to confirm persistence. This recommendation follows the consensus statement: Recommendations for the Management of Subsolid Pulmonary Nodules Detected at CT: A Statement from the [place]. Radiology [id number]. 3. Prior granulomatous disease. 4. Hepatic steatosis.   Electronically Signed   By: Kerby Moors M.D.   On: 01/22/2015 09:53   Mr Brain Wo Contrast  01/22/2015   CLINICAL DATA:  Ataxia.  Acute onset dizziness.  EXAM: MRI HEAD WITHOUT CONTRAST  TECHNIQUE: Multiplanar, multiecho pulse sequences of the brain and surrounding structures were obtained without intravenous contrast.  COMPARISON:  04/09/2014  FINDINGS: There is no evidence of acute infarct,  intracranial hemorrhage, mass, midline shift, or extra-axial fluid collection. There is mild generalized cerebral atrophy, within normal limits for age and unchanged. Patchy T2 hyperintensities in the periventricular greater than subcortical cerebral white matter do not appear significantly changed and are compatible with mild chronic small vessel ischemic disease.  Orbits are unremarkable. Right maxillary sinus fluid has decreased from the prior study. Mastoid air cells are clear. Major intracranial vascular flow voids are preserved.  IMPRESSION: 1. No acute intracranial abnormality. 2. Mild chronic small vessel ischemic disease.   Electronically Signed   By: Logan Bores   On: 01/22/2015 11:40     CBC  Recent Labs Lab 01/22/15 0641 01/23/15 0445  WBC 9.9 7.6  HGB 12.5 12.8  HCT 38.4 40.1  PLT 288 284  MCV 92.3 93.5  MCH 30.0 29.8  MCHC 32.6 31.9  RDW 15.6* 15.9*  LYMPHSABS 2.7  --   MONOABS 0.7  --   EOSABS 0.3  --   BASOSABS 0.0  --     Chemistries   Recent Labs Lab 01/22/15 0641 01/22/15 0729 01/23/15 0445  NA 136  --  140  K 4.2  --  3.8  CL 99*  --  104  CO2 25  --  27  GLUCOSE 204*  --  152*  BUN 11  --  <5*  CREATININE 1.14*  --  0.78  CALCIUM 9.5  --  9.0  AST  --  38  --   ALT  --  29  --   ALKPHOS  --  56  --   BILITOT  --  0.5  --    ------------------------------------------------------------------------------------------------------------------ estimated creatinine clearance is 53.8 mL/min (by C-G formula based on Cr of 0.78). ------------------------------------------------------------------------------------------------------------------  Recent Labs  01/22/15 1632  HGBA1C 8.4*   ------------------------------------------------------------------------------------------------------------------ No results for input(s): CHOL, HDL, LDLCALC, TRIG, CHOLHDL, LDLDIRECT in the last 72  hours. ------------------------------------------------------------------------------------------------------------------ No results for input(s): TSH, T4TOTAL, T3FREE, THYROIDAB in the last 72 hours.  Invalid input(s): FREET3 ------------------------------------------------------------------------------------------------------------------ No results for input(s): VITAMINB12, FOLATE, FERRITIN, TIBC, IRON, RETICCTPCT in the last 72 hours.  Coagulation profile  Recent Labs Lab 01/22/15 1632  INR 1.13    No results for input(s): DDIMER in  the last 72 hours.  Cardiac Enzymes No results for input(s): CKMB, TROPONINI, MYOGLOBIN in the last 168 hours.  Invalid input(s): CK ------------------------------------------------------------------------------------------------------------------ Invalid input(s): POCBNP     Time Spent in minutes   30 minutes   ELGERGAWY, DAWOOD M.D on 01/23/2015 at 12:45 PM  Between 7am to 7pm - Pager - [phone]  After 7pm go to [email or web address] - password [facility]  [facility]   Office  [phone]

## 2015-01-24 LAB — CBC
HCT: 37.7 % (ref 36.0–46.0)
Hemoglobin: 12.2 g/dL (ref 12.0–15.0)
MCH: 30.2 pg (ref 26.0–34.0)
MCHC: 32.4 g/dL (ref 30.0–36.0)
MCV: 93.3 fL (ref 78.0–100.0)
PLATELETS: 229 10*3/uL (ref 150–400)
RBC: 4.04 MIL/uL (ref 3.87–5.11)
RDW: 15.7 % — AB (ref 11.5–15.5)
WBC: 5.5 10*3/uL (ref 4.0–10.5)

## 2015-01-24 LAB — URINE CULTURE

## 2015-01-24 LAB — BASIC METABOLIC PANEL
ANION GAP: 6 (ref 5–15)
BUN: <5 mg/dL — ABNORMAL LOW (ref 6–20)
CHLORIDE: 102 mmol/L (ref 101–111)
CO2: 27 mmol/L (ref 22–32)
Calcium: 8.3 mg/dL — ABNORMAL LOW (ref 8.9–10.3)
Creatinine, Ser: 0.69 mg/dL (ref 0.44–1.00)
Glucose, Bld: 157 mg/dL — ABNORMAL HIGH (ref 65–99)
POTASSIUM: 3.9 mmol/L (ref 3.5–5.1)
SODIUM: 135 mmol/L (ref 135–145)

## 2015-01-24 LAB — GLUCOSE, CAPILLARY
Glucose-Capillary: 168 mg/dL — ABNORMAL HIGH (ref 65–99)
Glucose-Capillary: 172 mg/dL — ABNORMAL HIGH (ref 65–99)

## 2015-01-24 MED ORDER — INSULIN GLARGINE 100 UNITS/ML SOLOSTAR PEN: 5.0000 [IU] | PEN_INJECTOR | Freq: Every day | SUBCUTANEOUS | Status: DC

## 2015-01-24 MED ORDER — CEFTRIAXONE SODIUM IN DEXTROSE 20 MG/ML IV SOLN: 1.0000 g | INTRAVENOUS | Status: DC

## 2015-01-24 NOTE — Discharge Summary (Signed)
[name], is a 79 y.o. female  DOB 10/15/1935  MRN [id number].  Admission date:  01/22/2015  Admitting Physician  Oswald Hillock, MD  Discharge Date:  01/24/2015   Primary MD  [name], MD  Recommendations for primary care physician for things to follow:  - patient will need repeat CT chest in 3 months Incidental finding of pulmonary nodule and CT chest  - Check basic labs including CBC, BMP during this visit - Metformin was held on discharge giving lactic acidosis - Patient to continue with PT at home    Admission Diagnosis  Ataxia [R27.0] SOB (shortness of breath) [R06.02] Tachycardia [R00.0] Sepsis [A41.9]   Discharge Diagnosis  Ataxia [R27.0] SOB (shortness of breath) [R06.02] Tachycardia [R00.0] Sepsis [A41.9]    Principal Problem:   Elevated lactic acid level Active Problems:   UTI (lower urinary tract infection)   Rheumatoid arthritis   Tachycardia with 100 - 120 beats per minute   DM (diabetes mellitus)   Disequilibrium syndrome   Chronic diastolic heart failure, NYHA class 1      Past Medical History  Diagnosis Date  . Hypertension   . Chronic bronchitis     "get it just about q yr" (04/07/2014)  . Anginal pain   . Exertional shortness of breath   . High cholesterol   . Type II diabetes mellitus   . GERD (gastroesophageal reflux disease)   . Headache(784.0)     "once or twice/week" (04/07/2014)  . Arthritis     "all over my body"     Past Surgical History  Procedure Laterality Date  . Back surgery    . Cholecystectomy  1990's  . Appendectomy  1969  . Lumbar disc surgery      "slipped disc" (12/11/2012)  . Shoulder surgery Left ~ 2009    "put cement down in it"   . Vaginal hysterectomy  ~ 1973  . Ectopic pregnancy surgery  1969       History of present illness and  Hospital Course:     Kindly see H&P for history of present illness and admission  details, please review complete Labs, Consult reports and Test reports for all details in brief  HPI  from the history and physical done on the day of admission on 6/26   This is a 79 year old female patient with known diabetes, rheumatoid arthritis on chronic immunosuppression, hypertension, chronic bronchitis, apparent chronic dyspnea on exertion as well as dyslipidemia on statin. She was last admitted in September 2015 after presenting with dizziness and weakness with a sensation that she was going to pass out. During that evaluation she remained chronically tachycardic with heart rates between 100 and 120 but she was not orthostatic. It was recommended after that admission she pursue outpatient follow-up for possible pheochromocytoma. She returns to the ER today with generalized weakness with progressive dyspnea on exertion and some paroxysmal nocturnal dyspnea without clear signs of congestive heart failure as well as arthralgias that have become worsened over 1 week. She has  also noticed some dysuria and incomplete bladder emptying. She also was reporting some disequilibrium symptoms where she reports she has difficulty maintaining her balance and "I can't control we are undergoing". She does report some occasional dizziness that no true vertigo symptoms. In regards to the dyspnea on exertion she has seen her primary care physician for this and was prescribed an inhaler. She has not had any significant increase or decrease in her weight recently but has noticed over the past 1-2 years her activity level and tolerance has decreased.  In the ER she was afebrile, her heart rate was 124 bpm, respiratory rate was 18, her initial blood pressure was 116/69. Her room air saturations were 92%. There were concerns patient may been orthostatic given the fact she is on multiple blood pressure medications including medications for thiazide diuretics if she was given a liter of fluids. Because of the tachycardia CT  angiogram of the chest was ordered that did not reveal any significant issues including no evidence of a pulmonary embolus. Because for disequilibrium symptoms and MRI of the brain was ordered and is too was negative without any acute process. Laboratory data revealed mild renal insufficiency with a creatinine of 1.14 with her baseline creatinine averaging 0.77. Troponin was normal, CBC was within normal limits. Lactate at 7:30 AM was 1.81 but repeat lactate by 12 PM had increased to 3.14 despite IV fluids. Patient's urinalysis appeared consistent with UTI and given positive urinary symptoms there was some concern patient may be evolving a septic process. Blood and urine cultures are pending.   Hospital Course  79 year old female patient with known diabetes, rheumatoid arthritis on chronic immunosuppression, hypertension, chronic bronchitis, chronic dyspnea on exertion and dyslipidemia . Presents with generalized weakness, unsteady gait, workup was significant for lactic acidosis, and UTI.   UTI - treated with Rocephin X3 days, urine culture growing Escherichia coli sensitive to Rocephin.  Unsteady gait, generalized weakness - Patient is not orthostatic - MRI brain with no acute finding - Gait significantly improved, will arrange for home PT  Diabetes mellitus - Continue with Amaryl and Januvia,  hold metformin given lactic acidosis, Lantus dose lowered from 10 to 5 at home. - Glycohemoglobin A1c is 8.4  Lactic acidosis - Will stop metformin  Chronic diastolic CHF - Compensated  Rheumatoid arthritis - Continue with Rheumatrex every Monday  Tachycardia - CT chest angiography negative for PE  Incidental finding of pulmonary nodule and CT chest - patient will need repeat CT chest in 3 months. Discussed with patient, and her niece over the phone ( Ms Jeani Hawking), and they understand the importance of the follow-up   Discharge Condition: stable   Follow UP  Follow-up Information     Follow up with AVBUERE,EDWIN A, MD. Schedule an appointment as soon as possible for a visit in 1 week.   Specialty:  Internal Medicine   Why:  post hospitalization follow up.   Contact information:   [address] [place] [address] [phone]         Discharge Instructions  and  Discharge Medications         Discharge Instructions    Discharge instructions    Complete by:  As directed   Follow with Primary MD Nolene Ebbs A, MD in 7 days   Get CBC, CMP, 2 view Chest X ray checked  by Primary MD next visit.    Activity: As tolerated with Full fall precautions use walker/cane & assistance as needed   Disposition Home  Diet: Heart Healthy , carbohydrate modified , with feeding assistance and aspiration precautions.  For Heart failure patients - Check your Weight same time everyday, if you gain over 2 pounds, or you develop in leg swelling, experience more shortness of breath or chest pain, call your Primary MD immediately. Follow Cardiac Low Salt Diet and 1.5 lit/day fluid restriction.   On your next visit with your primary care physician please Get Medicines reviewed and adjusted.   Please request your Prim.MD to go over all Hospital Tests and Procedure/Radiological results at the follow up, please get all Hospital records sent to your Prim MD by signing hospital release before you go home.   If you experience worsening of your admission symptoms, develop shortness of breath, life threatening emergency, suicidal or homicidal thoughts you must seek medical attention immediately by calling 911 or calling your MD immediately  if symptoms less severe.  You Must read complete instructions/literature along with all the possible adverse reactions/side effects for all the Medicines you take and that have been prescribed to you. Take any new Medicines after you have completely understood and accpet all the possible adverse reactions/side effects.   Do not drive,  operating heavy machinery, perform activities at heights, swimming or participation in water activities or provide baby sitting services if your were admitted for syncope or siezures until you have seen by Primary MD or a Neurologist and advised to do so again.  Do not drive when taking Pain medications.    Do not take more than prescribed Pain, Sleep and Anxiety Medications  Special Instructions: If you have smoked or chewed Tobacco  in the last 2 yrs please stop smoking, stop any regular Alcohol  and or any Recreational drug use.  Wear Seat belts while driving.   Please note  You were cared for by a hospitalist during your hospital stay. If you have any questions about your discharge medications or the care you received while you were in the hospital after you are discharged, you can call the unit and asked to speak with the hospitalist on call if the hospitalist that took care of you is not available. Once you are discharged, your primary care physician will handle any further medical issues. Please note that NO REFILLS for any discharge medications will be authorized once you are discharged, as it is imperative that you return to your primary care physician (or establish a relationship with a primary care physician if you do not have one) for your aftercare needs so that they can reassess your need for medications and monitor your lab values.     Increase activity slowly    Complete by:  As directed             Medication List    STOP taking these medications        BC HEADACHE POWDER PO     lisinopril-hydrochlorothiazide 20-12.5 MG per tablet  Commonly known as:  PRINZIDE,ZESTORETIC     metFORMIN 1000 MG tablet  Commonly known as:  GLUCOPHAGE      TAKE these medications        albuterol 108 (90 BASE) MCG/ACT inhaler  Commonly known as:  PROVENTIL HFA;VENTOLIN HFA  Inhale 2 puffs into the lungs every 6 (six) hours as needed for wheezing or shortness of breath.     aspirin  EC 81 MG tablet  Take 1 tablet (81 mg total) by mouth daily.     atorvastatin 20 MG tablet  Commonly known as:  LIPITOR  Take 20 mg by mouth every morning.     diazepam 5 MG tablet  Commonly known as:  VALIUM  take 1 tablet by mouth once daily if needed     fluticasone 50 MCG/ACT nasal spray  Commonly known as:  FLONASE  instill 2 sprays into each nostril once daily     folic acid 1 MG tablet  Commonly known as:  FOLVITE  Take 1 mg by mouth daily.     glimepiride 2 MG tablet  Commonly known as:  AMARYL  Take 2 mg by mouth daily with breakfast.     insulin glargine 100 unit/mL Sopn  Commonly known as:  LANTUS  Inject 0.05 mLs (5 Units total) into the skin daily at 10 pm.     JANUVIA 100 MG tablet  Generic drug:  sitaGLIPtin  Take 100 mg by mouth daily.     methotrexate 2.5 MG tablet  Commonly known as:  RHEUMATREX  Take 25 mg by mouth once a week. Take 10 tablets by mouth every Monday     metoprolol tartrate 25 MG tablet  Commonly known as:  LOPRESSOR  Take 1 tablet (25 mg total) by mouth 2 (two) times daily.     promethazine 25 MG tablet  Commonly known as:  PHENERGAN  Take 25 mg by mouth every 6 (six) hours as needed for nausea or vomiting.     SUMAtriptan 50 MG tablet  Commonly known as:  IMITREX  Take 1 tablet (50 mg total) by mouth every 2 (two) hours as needed for migraine.     traMADol 50 MG tablet  Commonly known as:  ULTRAM  Take 50 mg by mouth 2 (two) times daily as needed for pain.     zolpidem 5 MG tablet  Commonly known as:  AMBIEN  Take 5 mg by mouth at bedtime as needed for sleep.          Diet and Activity recommendation: See Discharge Instructions above   Consults obtained -  none   Major procedures and Radiology Reports - PLEASE review detailed and final reports for all details, in brief -     Dg Chest 2 View  01/22/2015   CLINICAL DATA:  Midsternal chest pain and shortness of breath one week with weakness.  EXAM: CHEST  2 VIEW   COMPARISON:  04/07/2014 and chest CT 04/07/2014  FINDINGS: Lungs are hypoinflated with chronic linear density over the right lower lobe. No focal lobar consolidation, effusion or pneumothorax. Cardiomediastinal silhouette is within normal. Evidence of previous T11 and T12 kyphoplasties. Remainder of the exam is unchanged.  IMPRESSION: Hypoinflation without acute cardiopulmonary disease. Linear scarring right lower lobe.   Electronically Signed   By: Marin Olp M.D.   On: 01/22/2015 07:57   Ct Angio Chest Pe W/cm &/or Wo Cm  01/22/2015   CLINICAL DATA:  Chest pain.  Tachycardia  EXAM: CT ANGIOGRAPHY CHEST WITH CONTRAST  TECHNIQUE: Multidetector CT imaging of the chest was performed using the standard protocol during bolus administration of intravenous contrast. Multiplanar CT image reconstructions and MIPs were obtained to evaluate the vascular anatomy.  CONTRAST:  70mL OMNIPAQUE IOHEXOL 350 MG/ML SOLN  COMPARISON:  04/07/2014  FINDINGS: Mediastinum: The trachea appears patent and is midline. Normal appearance of the esophagus. The main pulmonary artery is patent. No lobar or segmental pulmonary artery filling defects identified.  Lungs/Pleura: No pleural effusion identified. Platelike atelectasis identified within the lung bases. No airspace consolidation noted. Granuloma identified in  the right upper lobe. Part solid nodule within the the lingula measures 11 mm, image 45/series 7.  Upper Abdomen: Mild diffuse hepatic steatosis. Prior cholecystectomy. The adrenal glands are unremarkable.  Musculoskeletal: Treated compression fractures within the lower thoracic and upper lumbar spine noted.  Review of the MIP images confirms the above findings.  IMPRESSION: 1. No evidence for acute pulmonary embolus. 2. Sub solid nodule in the lingula measures 11 mm. This is new from previous exam. Recommend followup examination in 3 months to confirm persistence. This recommendation follows the consensus statement:  Recommendations for the Management of Subsolid Pulmonary Nodules Detected at CT: A Statement from the [place]. Radiology 1950;[id number]:671-245. 3. Prior granulomatous disease. 4. Hepatic steatosis.   Electronically Signed   By: Kerby Moors M.D.   On: 01/22/2015 09:53   Mr Brain Wo Contrast  01/22/2015   CLINICAL DATA:  Ataxia.  Acute onset dizziness.  EXAM: MRI HEAD WITHOUT CONTRAST  TECHNIQUE: Multiplanar, multiecho pulse sequences of the brain and surrounding structures were obtained without intravenous contrast.  COMPARISON:  04/09/2014  FINDINGS: There is no evidence of acute infarct, intracranial hemorrhage, mass, midline shift, or extra-axial fluid collection. There is mild generalized cerebral atrophy, within normal limits for age and unchanged. Patchy T2 hyperintensities in the periventricular greater than subcortical cerebral white matter do not appear significantly changed and are compatible with mild chronic small vessel ischemic disease.  Orbits are unremarkable. Right maxillary sinus fluid has decreased from the prior study. Mastoid air cells are clear. Major intracranial vascular flow voids are preserved.  IMPRESSION: 1. No acute intracranial abnormality. 2. Mild chronic small vessel ischemic disease.   Electronically Signed   By: Logan Bores   On: 01/22/2015 11:40    Micro Results    Recent Results (from the past 240 hour(s))  Urine culture     Status: None   Collection Time: 01/22/15  6:45 AM  Result Value Ref Range Status   Specimen Description URINE, RANDOM  Final   Special Requests NONE  Final   Culture >=100,000 COLONIES/mL CITROBACTER FREUNDII  Final   Report Status 01/24/2015 FINAL  Final   Organism ID, Bacteria CITROBACTER FREUNDII  Final      Susceptibility   Citrobacter freundii - MIC*    CEFAZOLIN >=64 RESISTANT Resistant     CEFTRIAXONE <=1 SENSITIVE Sensitive     CIPROFLOXACIN <=0.25 SENSITIVE Sensitive     GENTAMICIN <=1 SENSITIVE Sensitive     IMIPENEM  <=0.25 SENSITIVE Sensitive     NITROFURANTOIN <=16 SENSITIVE Sensitive     TRIMETH/SULFA <=20 SENSITIVE Sensitive     PIP/TAZO 16 SENSITIVE Sensitive     * >=100,000 COLONIES/mL CITROBACTER FREUNDII  Culture, blood (routine x 2)     Status: None (Preliminary result)   Collection Time: 01/22/15  7:29 AM  Result Value Ref Range Status   Specimen Description BLOOD RIGHT ANTECUBITAL  Final   Special Requests BOTTLES DRAWN AEROBIC AND ANAEROBIC 5CC  Final   Culture NO GROWTH 1 DAY  Final   Report Status PENDING  Incomplete       Today   Subjective:   [name] today has no headache,no chest abdominal pain,no new weakness tingling or numbness, feels much better wants to go home today.   Objective:   Blood pressure 113/55, pulse 105, temperature 99.6 F (37.6 C), temperature source Oral, resp. rate 18, height 5\' 2"  (1.575 m), weight 68.2 kg (150 lb 5.7 oz), SpO2 92 %.   Intake/Output Summary (Last  24 hours) at 01/24/15 1101 Last data filed at 01/24/15 1058  Gross per 24 hour  Intake   1320 ml  Output    350 ml  Net    970 ml    Exam Awake Alert, Oriented x 3, No new F.N deficits, Normal affect [place].AT,PERRAL Supple Neck,No JVD, No cervical lymphadenopathy appriciated.  Symmetrical Chest wall movement, Good air movement bilaterally, CTAB RRR,No Gallops,Rubs or new Murmurs, No Parasternal Heave +ve B.Sounds, Abd Soft, Non tender, No organomegaly appriciated, No rebound -guarding or rigidity. No Cyanosis, Clubbing or edema, No new Rash or bruise  Data Review   CBC w Diff:  Lab Results  Component Value Date   WBC 5.5 01/24/2015   HGB 12.2 01/24/2015   HCT 37.7 01/24/2015   PLT 229 01/24/2015   LYMPHOPCT 28 01/22/2015   MONOPCT 7 01/22/2015   EOSPCT 3 01/22/2015   BASOPCT 0 01/22/2015    CMP:  Lab Results  Component Value Date   NA 135 01/24/2015   K 3.9 01/24/2015   CL 102 01/24/2015   CO2 27 01/24/2015   BUN <5* 01/24/2015   CREATININE 0.69 01/24/2015    PROT 6.4* 01/22/2015   ALBUMIN 3.2* 01/22/2015   BILITOT 0.5 01/22/2015   ALKPHOS 56 01/22/2015   AST 38 01/22/2015   ALT 29 01/22/2015  .   Total Time in preparing paper work, data evaluation and todays exam - 35 minutes  Jonita Hirota M.D on 01/24/2015 at [place]  [phone]

## 2015-01-24 NOTE — Care Management (Signed)
Important Message  Patient Details  Name: [name] MRN: [id number] Date of Birth: 11-11-1935   Medicare Important Message Given:  Yes-second notification given    Delorse Lek 01/24/2015, 11:21 AM

## 2015-01-24 NOTE — Care Management Note (Signed)
Case Management Note  Patient Details  Name: [name] MRN: [id number] Date of Birth: 1935-09-07  Subjective/Objective:        CM following for progression and d/c planning.            Action/Plan: 01/24/2015 Met with pt and discussed HH needs, pt requesting 3:1 as recommended by PT and OT eval, this was ordered. Pt setup with [facility] for HHPT. That agency notified and rep will see pt prior to d/c.  Expected Discharge Date:  01/24/15               Expected Discharge Plan:  [place]  In-House Referral:  NA  Discharge planning Services  CM Consult  Post Acute Care Choice:  Durable Medical Equipment Choice offered to:  Patient  DME Arranged:  Bedside commode DME Agency:  [place]:  PT [facility] Agency:  Other - See comment  Status of Service:  Completed, signed off  Medicare Important Message Given:    Date Medicare IM Given:    Medicare IM give by:    Date Additional Medicare IM Given:    Additional Medicare Important Message give by:     If discussed at [place] of Stay Meetings, dates discussed:    Additional Comments:  Adron Bene, RN 01/24/2015, 10:18 AM

## 2015-01-24 NOTE — Discharge Instructions (Signed)
Follow with Primary MD AVBUERE,EDWIN A, MD in 7 days  ° °Get CBC, CMP, 2 view Chest X ray checked  by Primary MD next visit.  ° ° °Activity: As tolerated with Full fall precautions use walker/cane & assistance as needed ° ° °Disposition Home  ° ° °Diet: Heart Healthy  , with feeding assistance and aspiration precautions. ° °For Heart failure patients - Check your Weight same time everyday, if you gain over 2 pounds, or you develop in leg swelling, experience more shortness of breath or chest pain, call your Primary MD immediately. Follow Cardiac Low Salt Diet and 1.5 lit/day fluid restriction. ° ° °On your next visit with your primary care physician please Get Medicines reviewed and adjusted. ° ° °Please request your Prim.MD to go over all Hospital Tests and Procedure/Radiological results at the follow up, please get all Hospital records sent to your Prim MD by signing hospital release before you go home. ° ° °If you experience worsening of your admission symptoms, develop shortness of breath, life threatening emergency, suicidal or homicidal thoughts you must seek medical attention immediately by calling 911 or calling your MD immediately  if symptoms less severe. ° °You Must read complete instructions/literature along with all the possible adverse reactions/side effects for all the Medicines you take and that have been prescribed to you. Take any new Medicines after you have completely understood and accpet all the possible adverse reactions/side effects.  ° °Do not drive, operating heavy machinery, perform activities at heights, swimming or participation in water activities or provide baby sitting services if your were admitted for syncope or siezures until you have seen by Primary MD or a Neurologist and advised to do so again. ° °Do not drive when taking Pain medications.  ° ° °Do not take more than prescribed Pain, Sleep and Anxiety Medications ° °Special Instructions: If you have smoked or chewed Tobacco  in  the last 2 yrs please stop smoking, stop any regular Alcohol  and or any Recreational drug use. ° °Wear Seat belts while driving. ° ° °Please note ° °You were cared for by a hospitalist during your hospital stay. If you have any questions about your discharge medications or the care you received while you were in the hospital after you are discharged, you can call the unit and asked to speak with the hospitalist on call if the hospitalist that took care of you is not available. Once you are discharged, your primary care physician will handle any further medical issues. Please note that NO REFILLS for any discharge medications will be authorized once you are discharged, as it is imperative that you return to your primary care physician (or establish a relationship with a primary care physician if you do not have one) for your aftercare needs so that they can reassess your need for medications and monitor your lab values. ° °

## 2015-01-24 NOTE — Progress Notes (Signed)
Patient Discharge: Disposition:  Patient discharged to Home with Home health PT.  Education: Reviewed medications, prescriptions, follow-up appointments, and discharge instructions, understood and acknowledged. IV: Discontinued before discharge. Telemetry: Discontinue before discharge, CCMD notified. Transportation: Transported in w/c out of the facility with niece and staff. Belongings: Patient took all her belongings with her.

## 2015-01-24 NOTE — Evaluation (Addendum)
Occupational Therapy Treatment Patient Details Name: [name] MRN: [id number] DOB: [birth year] Today's Date: 01/24/2015    History of Present Illness 79 year old female patient with known diabetes, rheumatoid arthritis on chronic immunosuppression, hypertension, chronic bronchitis, chronic dyspnea on exertion and dyslipidemi. Presents with generalized weakness, unsteady gait, workup was significant for lactic acidosis, and UTI.   Clinical Impression   Pt with continued impaired balance and deficits in R peripheral visual field.  Educated pt to continue use of RW at home.  Pt educated in use and benefits of tub transfer bench vs use of 3 in 1 as a tub seat, pt prefers to use 3 in 1 and niece will position it for her.  Follow Up Recommendations  No OT follow up;Supervision - Intermittent    Equipment Recommendations  3 in 1 bedside comode    Recommendations for Other Services       Precautions / Restrictions Precautions Precautions: Fall Restrictions Weight Bearing Restrictions: No      Mobility Bed Mobility Overal bed mobility: Independent             General bed mobility comments: flat bed, no use of rails  Transfers Overall transfer level: Needs assistance Equipment used: None Transfers: Sit to/from Stand Sit to Stand: Modified independent (Device/Increase time)         General transfer comment: extra time    Balance                                            ADL Overall ADL's : Needs assistance/impaired                         Toilet Transfer: Supervision/safety;Ambulation;BSC (over toilet) Toilet Transfer Details (indicate cue type and reason): pt is knowledgeable in use of 3 in 1 over toilet Toileting- Clothing Manipulation and Hygiene: Modified independent;Sit to/from stand   Tub/ Shower Transfer: Min guard;Ambulation;3 in 1 Tub/Shower Transfer Details (indicate cue type and reason): instructed pt in availability  of tub transfer bench and that it is a covered item under medicaid, pt prefers to use 3 in 1 in tub and will have her niece move it from her toilet to her tub.  Pt showers when her niece is in the home.         Vision Additional Comments: pt with intact L peripheral field, but difficulty with R field blurriness   Perception     Praxis      Pertinent Vitals/Pain Pain Assessment: No/denies pain     Hand Dominance     Extremity/Trunk Assessment             Communication     Cognition Arousal/Alertness: Awake/alert Behavior During Therapy: WFL for tasks assessed/performed Overall Cognitive Status: Within Functional Limits for tasks assessed                     General Comments       Exercises       Shoulder Instructions      Home Living                                          Prior Functioning/Environment  OT Diagnosis:     OT Problem List:     OT Treatment/Interventions:      OT Goals(Current goals can be found in the care plan section) Acute Rehab OT Goals Patient Stated Goal: go home  OT Frequency: Min 2X/week   Barriers to D/C:            Co-evaluation              End of Session    Activity Tolerance: Patient tolerated treatment well Patient left: in bed;with call bell/phone within reach;with bed alarm set   Time: 7412-8786 OT Time Calculation (min): 22 min Charges:  OT General Charges $OT Visit: 1 Procedure OT Treatments $Self Care/Home Management : 8-22 mins G-Codes:    Malka So 01/24/2015, 9:57 AM  [phone]

## 2015-01-27 ENCOUNTER — Emergency Department (HOSPITAL_COMMUNITY)
Admission: EM | Admit: 2015-01-27 | Discharge: 2015-01-27 | Disposition: A | Discharge status: Home / Self Care | Payer: Medicare Other | Attending: Emergency Medicine | Admitting: Emergency Medicine

## 2015-01-27 ENCOUNTER — Emergency Department (HOSPITAL_COMMUNITY): Payer: Medicare Other

## 2015-01-27 ENCOUNTER — Encounter (HOSPITAL_COMMUNITY): Payer: Self-pay | Admitting: *Deleted

## 2015-01-27 DIAGNOSIS — Z7982 Long term (current) use of aspirin: Secondary | ICD-10-CM | POA: Insufficient documentation

## 2015-01-27 DIAGNOSIS — R079 Chest pain, unspecified: Secondary | ICD-10-CM | POA: Diagnosis not present

## 2015-01-27 DIAGNOSIS — R000 Tachycardia, unspecified: Secondary | ICD-10-CM | POA: Diagnosis not present

## 2015-01-27 DIAGNOSIS — N390 Urinary tract infection, site not specified: Secondary | ICD-10-CM | POA: Insufficient documentation

## 2015-01-27 DIAGNOSIS — Z7951 Long term (current) use of inhaled steroids: Secondary | ICD-10-CM | POA: Insufficient documentation

## 2015-01-27 DIAGNOSIS — Z8719 Personal history of other diseases of the digestive system: Secondary | ICD-10-CM | POA: Insufficient documentation

## 2015-01-27 DIAGNOSIS — E119 Type 2 diabetes mellitus without complications: Secondary | ICD-10-CM | POA: Diagnosis not present

## 2015-01-27 DIAGNOSIS — Z79899 Other long term (current) drug therapy: Secondary | ICD-10-CM | POA: Insufficient documentation

## 2015-01-27 DIAGNOSIS — I509 Heart failure, unspecified: Secondary | ICD-10-CM | POA: Insufficient documentation

## 2015-01-27 DIAGNOSIS — M1990 Unspecified osteoarthritis, unspecified site: Secondary | ICD-10-CM | POA: Diagnosis not present

## 2015-01-27 DIAGNOSIS — Z794 Long term (current) use of insulin: Secondary | ICD-10-CM | POA: Insufficient documentation

## 2015-01-27 DIAGNOSIS — E780 Pure hypercholesterolemia: Secondary | ICD-10-CM | POA: Diagnosis not present

## 2015-01-27 DIAGNOSIS — Z9049 Acquired absence of other specified parts of digestive tract: Secondary | ICD-10-CM | POA: Diagnosis not present

## 2015-01-27 DIAGNOSIS — I10 Essential (primary) hypertension: Secondary | ICD-10-CM | POA: Insufficient documentation

## 2015-01-27 DIAGNOSIS — R109 Unspecified abdominal pain: Secondary | ICD-10-CM | POA: Diagnosis not present

## 2015-01-27 DIAGNOSIS — Z760 Encounter for issue of repeat prescription: Secondary | ICD-10-CM | POA: Diagnosis present

## 2015-01-27 LAB — BASIC METABOLIC PANEL
ANION GAP: 11 (ref 5–15)
BUN: 13 mg/dL (ref 6–20)
CHLORIDE: 101 mmol/L (ref 101–111)
CO2: 26 mmol/L (ref 22–32)
Calcium: 9.9 mg/dL (ref 8.9–10.3)
Creatinine, Ser: 1.34 mg/dL — ABNORMAL HIGH (ref 0.44–1.00)
GFR calc Af Amer: 43 mL/min — ABNORMAL LOW (ref >60)
GFR, EST NON AFRICAN AMERICAN: 37 mL/min — AB (ref >60)
Glucose, Bld: 159 mg/dL — ABNORMAL HIGH (ref 65–99)
Potassium: 3.6 mmol/L (ref 3.5–5.1)
SODIUM: 138 mmol/L (ref 135–145)

## 2015-01-27 LAB — CBC WITH DIFFERENTIAL/PLATELET
BASOS ABS: 0 10*3/uL (ref 0.0–0.1)
Basophils Relative: 0 % (ref 0–1)
Eosinophils Absolute: 0.1 10*3/uL (ref 0.0–0.7)
Eosinophils Relative: 2 % (ref 0–5)
HCT: 33.8 % — ABNORMAL LOW (ref 36.0–46.0)
HEMOGLOBIN: 11.1 g/dL — AB (ref 12.0–15.0)
LYMPHS PCT: 37 % (ref 12–46)
Lymphs Abs: 2 10*3/uL (ref 0.7–4.0)
MCH: 30.2 pg (ref 26.0–34.0)
MCHC: 32.8 g/dL (ref 30.0–36.0)
MCV: 91.8 fL (ref 78.0–100.0)
Monocytes Absolute: 0 10*3/uL — ABNORMAL LOW (ref 0.1–1.0)
Monocytes Relative: 1 % — ABNORMAL LOW (ref 3–12)
NEUTROS ABS: 3.2 10*3/uL (ref 1.7–7.7)
NEUTROS PCT: 60 % (ref 43–77)
Platelets: 252 10*3/uL (ref 150–400)
RBC: 3.68 MIL/uL — ABNORMAL LOW (ref 3.87–5.11)
RDW: 15.4 % (ref 11.5–15.5)
WBC: 5.3 10*3/uL (ref 4.0–10.5)

## 2015-01-27 LAB — URINALYSIS, ROUTINE W REFLEX MICROSCOPIC
Bilirubin Urine: NEGATIVE
Glucose, UA: NEGATIVE mg/dL
HGB URINE DIPSTICK: NEGATIVE
Ketones, ur: NEGATIVE mg/dL
NITRITE: NEGATIVE
Protein, ur: NEGATIVE mg/dL
Specific Gravity, Urine: 1.013 (ref 1.005–1.030)
Urobilinogen, UA: 0.2 mg/dL (ref 0.0–1.0)
pH: 5 (ref 5.0–8.0)

## 2015-01-27 LAB — URINE MICROSCOPIC-ADD ON

## 2015-01-27 LAB — CULTURE, BLOOD (ROUTINE X 2): Culture: NO GROWTH

## 2015-01-27 LAB — I-STAT TROPONIN, ED: Troponin i, poc: 0.01 ng/mL (ref 0.00–0.08)

## 2015-01-27 MED ORDER — CIPROFLOXACIN HCL 500 MG PO TABS: 500.0000 mg | ORAL_TABLET | Freq: Two times a day (BID) | ORAL | Status: DC

## 2015-01-27 MED ORDER — SODIUM CHLORIDE 0.9 % IV BOLUS (SEPSIS)
500.0000 mL | Freq: Once | INTRAVENOUS | Status: AC
  Administered 2015-01-27: 500 mL via INTRAVENOUS

## 2015-01-27 NOTE — ED Notes (Signed)
Pt reports being admitted recently for UTI, was discharged without any prescription and states dr office is closed today and has pain to entire body.

## 2015-01-27 NOTE — Discharge Instructions (Signed)
Urinary Tract Infection Keep your follow-up appointment next week. Return for any fever, vomiting, abdominal pain.  Urinary tract infections (UTIs) can develop anywhere along your urinary tract. Your urinary tract is your body's drainage system for removing wastes and extra water. Your urinary tract includes two kidneys, two ureters, a bladder, and a urethra. Your kidneys are a pair of bean-shaped organs. Each kidney is about the size of your fist. They are located below your ribs, one on each side of your spine. CAUSES Infections are caused by microbes, which are microscopic organisms, including fungi, viruses, and bacteria. These organisms are so small that they can only be seen through a microscope. Bacteria are the microbes that most commonly cause UTIs. SYMPTOMS  Symptoms of UTIs may vary by age and gender of the patient and by the location of the infection. Symptoms in young women typically include a frequent and intense urge to urinate and a painful, burning feeling in the bladder or urethra during urination. Older women and men are more likely to be tired, shaky, and weak and have muscle aches and abdominal pain. A fever may mean the infection is in your kidneys. Other symptoms of a kidney infection include pain in your back or sides below the ribs, nausea, and vomiting. DIAGNOSIS To diagnose a UTI, your caregiver will ask you about your symptoms. Your caregiver also will ask to provide a urine sample. The urine sample will be tested for bacteria and white blood cells. White blood cells are made by your body to help fight infection. TREATMENT  Typically, UTIs can be treated with medication. Because most UTIs are caused by a bacterial infection, they usually can be treated with the use of antibiotics. The choice of antibiotic and length of treatment depend on your symptoms and the type of bacteria causing your infection. HOME CARE INSTRUCTIONS  If you were prescribed antibiotics, take them  exactly as your caregiver instructs you. Finish the medication even if you feel better after you have only taken some of the medication.  Drink enough water and fluids to keep your urine clear or pale yellow.  Avoid caffeine, tea, and carbonated beverages. They tend to irritate your bladder.  Empty your bladder often. Avoid holding urine for long periods of time.  Empty your bladder before and after sexual intercourse.  After a bowel movement, women should cleanse from front to back. Use each tissue only once. SEEK MEDICAL CARE IF:   You have back pain.  You develop a fever.  Your symptoms do not begin to resolve within 3 days. SEEK IMMEDIATE MEDICAL CARE IF:   You have severe back pain or lower abdominal pain.  You develop chills.  You have nausea or vomiting.  You have continued burning or discomfort with urination. MAKE SURE YOU:   Understand these instructions.  Will watch your condition.  Will get help right away if you are not doing well or get worse. Document Released: 04/24/2005 Document Revised: 01/14/2012 Document Reviewed: 08/23/2011 [facility] Patient Information 2015 [place], [place]. This information is not intended to replace advice given to you by your health care provider. Make sure you discuss any questions you have with your health care provider.

## 2015-01-27 NOTE — ED Provider Notes (Signed)
CSN: [id number]     Arrival date & time 01/27/15  0941 History   First MD Initiated Contact with Patient 01/27/15 [phone]     Chief Complaint  Patient presents with  . Pain  . Medication Refill  Med Refill   (Consider location/radiation/quality/duration/timing/severity/associated sxs/prior Treatment) The history is provided by the patient. No language interpreter was used.  [name] is a 79 year old female with a history of chronic diastolic heart failure, hypertension, diabetes and rheumatoid arthritis who presents with pelvic discomfort, dysuria and arthralgias for the past 10 days. She states she was admitted on 01/22/2015 for UTI and was given IV anabiotic's. She was under the impression that she would be going home on anabiotic's but was told to call her doctor for prescription. She states she went there this morning in the office was closed. She denies any swelling to the bilateral extremities, chest pain, shortness of breath, nausea, vomiting, diarrhea.   Past Medical History  Diagnosis Date  . Hypertension   . Chronic bronchitis     "get it just about q yr" (04/07/2014)  . Anginal pain   . Exertional shortness of breath   . High cholesterol   . Type II diabetes mellitus   . GERD (gastroesophageal reflux disease)   . Headache(784.0)     "once or twice/week" (04/07/2014)  . Arthritis     "all over my body"    Past Surgical History  Procedure Laterality Date  . Back surgery    . Cholecystectomy  1990's  . Appendectomy  1969  . Lumbar disc surgery      "slipped disc" (12/11/2012)  . Shoulder surgery Left ~ 2009    "put cement down in it"   . Vaginal hysterectomy  ~ 1973  . Ectopic pregnancy surgery  1969   Family History  Problem Relation Age of Onset  . Hypertension Mother   . Heart attack Father   . Diabetes Mellitus II Sister   . Hypertension Sister   . Cancer - Other Sister    History  Substance Use Topics  . Smoking status: Never Smoker   . Smokeless  tobacco: Never Used     Comment: 04/07/2014"1 pack of cigarettes would last 3-4 months; haven't had any cigarettes for 40 years"  . Alcohol Use: No     Comment: used to drink beer about 15 yrs ago   OB History    No data available     Review of Systems  Constitutional: Negative for fever.  Respiratory: Negative for shortness of breath.   Cardiovascular: Negative for chest pain and leg swelling.  Gastrointestinal: Negative for vomiting.  Skin: Negative for rash.  All other systems reviewed and are negative.     Allergies  Review of patient's allergies indicates no known allergies.  Home Medications   Prior to Admission medications   Medication Sig Start Date End Date Taking? Authorizing Provider  albuterol (PROVENTIL HFA;VENTOLIN HFA) 108 (90 BASE) MCG/ACT inhaler Inhale 2 puffs into the lungs every 6 (six) hours as needed for wheezing or shortness of breath.   Yes Historical Provider, MD  aspirin EC 81 MG tablet Take 1 tablet (81 mg total) by mouth daily. 04/09/14  Yes Debbe Odea, MD  atorvastatin (LIPITOR) 20 MG tablet Take 20 mg by mouth every morning.  01/11/15  Yes Historical Provider, MD  diazepam (VALIUM) 5 MG tablet take 1 tablet by mouth once daily if needed 01/05/15  Yes Historical Provider, MD  fluticasone (FLONASE) 50 MCG/ACT nasal  spray instill 2 sprays into each nostril once daily 01/05/15  Yes Historical Provider, MD  folic acid (FOLVITE) 1 MG tablet Take 1 mg by mouth daily.   Yes Historical Provider, MD  glimepiride (AMARYL) 2 MG tablet Take 2 mg by mouth daily with breakfast.  01/11/15  Yes Historical Provider, MD  insulin glargine (LANTUS) 100 unit/mL SOPN Inject 0.05 mLs (5 Units total) into the skin daily at 10 pm. 01/24/15  Yes Silver Huguenin Elgergawy, MD  JANUVIA 100 MG tablet Take 100 mg by mouth daily.  01/11/15  Yes Historical Provider, MD  methotrexate (RHEUMATREX) 2.5 MG tablet Take 25 mg by mouth once a week. Take 10 tablets by mouth every Monday 01/12/15  Yes  Historical Provider, MD  metoprolol tartrate (LOPRESSOR) 25 MG tablet Take 1 tablet (25 mg total) by mouth 2 (two) times daily. 04/09/14  Yes Debbe Odea, MD  traMADol (ULTRAM) 50 MG tablet Take 50 mg by mouth 2 (two) times daily as needed for pain.  05/04/12  Yes Barbara Cower, MD  zolpidem (AMBIEN) 5 MG tablet Take 5 mg by mouth at bedtime as needed for sleep.  12/27/14  Yes Historical Provider, MD  ciprofloxacin (CIPRO) 500 MG tablet Take 1 tablet (500 mg total) by mouth 2 (two) times daily. 01/27/15   Vicki Anwar Patel-Mills, PA-C  promethazine (PHENERGAN) 25 MG tablet Take 25 mg by mouth every 6 (six) hours as needed for nausea or vomiting.  12/16/14   Historical Provider, MD  SUMAtriptan (IMITREX) 50 MG tablet Take 1 tablet (50 mg total) by mouth every 2 (two) hours as needed for migraine. Patient not taking: Reported on 01/22/2015 12/15/12   Verlee Monte, MD   BP 116/95 mmHg  Pulse 97  Temp(Src) 97.7 F (36.5 C) (Oral)  Resp 18  SpO2 94% Physical Exam  Constitutional: She is oriented to person, place, and time. She appears well-developed and well-nourished.  HENT:  Head: Normocephalic and atraumatic.  Eyes: Conjunctivae are normal.  Neck: Normal range of motion. Neck supple.  Cardiovascular: Regular rhythm and normal heart sounds.  Tachycardia present.   Pulmonary/Chest: Effort normal. No accessory muscle usage. No respiratory distress. She has decreased breath sounds in the right lower field and the left lower field. She has no wheezes. She has no rhonchi. She has no rales.  No respiratory distress. No tripoding.  Abdominal: Soft. Normal appearance. She exhibits no distension and no mass. There is no tenderness. There is no rigidity, no rebound, no guarding and no CVA tenderness.  Musculoskeletal: Normal range of motion.  Neurological: She is alert and oriented to person, place, and time.  Skin: Skin is warm and dry. No rash noted.  Psychiatric: She has a normal mood and affect. Her  behavior is normal.  Nursing note and vitals reviewed.   ED Course  Procedures (including critical care time) Labs Review Labs Reviewed  CBC WITH DIFFERENTIAL/PLATELET - Abnormal; Notable for the following:    RBC 3.68 (*)    Hemoglobin 11.1 (*)    HCT 33.8 (*)    Monocytes Relative 1 (*)    Monocytes Absolute 0.0 (*)    All other components within normal limits  BASIC METABOLIC PANEL - Abnormal; Notable for the following:    Glucose, Bld 159 (*)    Creatinine, Ser 1.34 (*)    GFR calc non Af Amer 37 (*)    GFR calc Af Amer 43 (*)    All other components within normal limits  URINALYSIS, ROUTINE W  REFLEX MICROSCOPIC (NOT AT [facility]) - Abnormal; Notable for the following:    APPearance CLOUDY (*)    Leukocytes, UA LARGE (*)    All other components within normal limits  URINE MICROSCOPIC-ADD ON - Abnormal; Notable for the following:    Squamous Epithelial / LPF FEW (*)    Casts HYALINE CASTS (*)    All other components within normal limits  URINE CULTURE  I-STAT TROPOININ, ED    Imaging Review Dg Chest 2 View  01/27/2015   CLINICAL DATA:  Recent admission for urinary tract infection. Generalized abdominal pain. History of hypertension. Initial encounter.  EXAM: CHEST  2 VIEW  COMPARISON:  CT and radiographs 01/22/2015.  FINDINGS: The heart size and mediastinal contours are stable. There is slightly better inspiration on the current examination with otherwise stable chronic bibasilar atelectasis or scarring. No superimposed airspace disease, edema or significant pleural effusion identified. Previous spinal augmentation noted at T11 and T12.  IMPRESSION: No significant change in bibasilar atelectasis or scarring from recent studies.   Electronically Signed   By: Richardean Sale M.D.   On: 01/27/2015 11:07     EKG Interpretation   Date/Time:  Friday January 27 2015 10:32:42 EDT Ventricular Rate:  102 PR Interval:  149 QRS Duration: 91 QT Interval:  352 QTC Calculation: 458 R Axis:    -17 Text Interpretation:  Sinus tachycardia Borderline left axis deviation  Anteroseptal infarct, age indeterminate Confirmed by COOK  MD, Garceno  [phone]) on 01/27/2015 11:11:03 AM      MDM   Final diagnoses:  Acute UTI  Patient was recently admitted for UTI on 01/22/15 and discharged on 01/24/15.  She has no new complaints but states that she is still having dysuria and pelvic discomfort.  She was expecting to get antibiotics from her pcp today but the office was closed.    Recheck: Nurse states that the patient is complaining of chest pain. Protocol orders were put in. Upon recheck the patient states that she's had this chest pain for over 3 months now. Her troponin is negativean EKG is not concerning. Patient has chronic tachycardia. Her chest x-ray has not changed from recent studies. She was recently evaluated in the hospital. She is mildly anemic but otherwise labs are not concerning. She does have a UTI with large leukocytes. I feel comfortable sending her home with oral anabiotic's after looking at her urine culture. She is sensitive to ciprofloxacin. She is afebrile with no leukocytosis and no back pain. She also has a follow-up appointment next week with her primary care physician and her pulmonologist. I discussed keeping her appointment and returning if she developed a fever, vomiting or abdominal pain. Patient and husband verbally agree with the plan.   Ottie Glazier, PA-C 01/27/15 [place], MD 01/27/15 1600

## 2015-01-27 NOTE — ED Notes (Signed)
Pt transported to xray 

## 2015-01-27 NOTE — ED Notes (Signed)
Pt back from x-ray.

## 2015-01-29 ENCOUNTER — Emergency Department (HOSPITAL_COMMUNITY)
Admission: EM | Admit: 2015-01-29 | Discharge: 2015-01-29 | Disposition: A | Discharge status: Home / Self Care | Payer: Medicare Other | Attending: Emergency Medicine | Admitting: Emergency Medicine

## 2015-01-29 ENCOUNTER — Emergency Department (HOSPITAL_COMMUNITY): Payer: Medicare Other

## 2015-01-29 ENCOUNTER — Encounter (HOSPITAL_COMMUNITY): Payer: Self-pay | Admitting: Emergency Medicine

## 2015-01-29 DIAGNOSIS — E785 Hyperlipidemia, unspecified: Secondary | ICD-10-CM | POA: Diagnosis not present

## 2015-01-29 DIAGNOSIS — I10 Essential (primary) hypertension: Secondary | ICD-10-CM | POA: Insufficient documentation

## 2015-01-29 DIAGNOSIS — Z79899 Other long term (current) drug therapy: Secondary | ICD-10-CM | POA: Diagnosis not present

## 2015-01-29 DIAGNOSIS — B373 Candidiasis of vulva and vagina: Secondary | ICD-10-CM | POA: Diagnosis not present

## 2015-01-29 DIAGNOSIS — M1990 Unspecified osteoarthritis, unspecified site: Secondary | ICD-10-CM | POA: Insufficient documentation

## 2015-01-29 DIAGNOSIS — Z794 Long term (current) use of insulin: Secondary | ICD-10-CM | POA: Insufficient documentation

## 2015-01-29 DIAGNOSIS — I209 Angina pectoris, unspecified: Secondary | ICD-10-CM | POA: Insufficient documentation

## 2015-01-29 DIAGNOSIS — K130 Diseases of lips: Secondary | ICD-10-CM | POA: Insufficient documentation

## 2015-01-29 DIAGNOSIS — B379 Candidiasis, unspecified: Secondary | ICD-10-CM | POA: Diagnosis not present

## 2015-01-29 DIAGNOSIS — R350 Frequency of micturition: Secondary | ICD-10-CM | POA: Diagnosis not present

## 2015-01-29 DIAGNOSIS — R1030 Lower abdominal pain, unspecified: Secondary | ICD-10-CM

## 2015-01-29 DIAGNOSIS — E119 Type 2 diabetes mellitus without complications: Secondary | ICD-10-CM | POA: Insufficient documentation

## 2015-01-29 DIAGNOSIS — Z7951 Long term (current) use of inhaled steroids: Secondary | ICD-10-CM | POA: Insufficient documentation

## 2015-01-29 DIAGNOSIS — N390 Urinary tract infection, site not specified: Secondary | ICD-10-CM | POA: Diagnosis not present

## 2015-01-29 DIAGNOSIS — R000 Tachycardia, unspecified: Secondary | ICD-10-CM | POA: Diagnosis not present

## 2015-01-29 DIAGNOSIS — Z7982 Long term (current) use of aspirin: Secondary | ICD-10-CM | POA: Diagnosis not present

## 2015-01-29 DIAGNOSIS — K1379 Other lesions of oral mucosa: Secondary | ICD-10-CM | POA: Diagnosis not present

## 2015-01-29 LAB — CBC
HCT: 39.2 % (ref 36.0–46.0)
HEMOGLOBIN: 12.8 g/dL (ref 12.0–15.0)
MCH: 30.5 pg (ref 26.0–34.0)
MCHC: 32.7 g/dL (ref 30.0–36.0)
MCV: 93.6 fL (ref 78.0–100.0)
Platelets: 244 10*3/uL (ref 150–400)
RBC: 4.19 MIL/uL (ref 3.87–5.11)
RDW: 15.3 % (ref 11.5–15.5)
WBC: 7.5 10*3/uL (ref 4.0–10.5)

## 2015-01-29 LAB — URINALYSIS, ROUTINE W REFLEX MICROSCOPIC
Glucose, UA: NEGATIVE mg/dL
HGB URINE DIPSTICK: NEGATIVE
KETONES UR: 40 mg/dL — AB
LEUKOCYTES UA: NEGATIVE
Nitrite: NEGATIVE
PH: 5 (ref 5.0–8.0)
PROTEIN: NEGATIVE mg/dL
SPECIFIC GRAVITY, URINE: 1.023 (ref 1.005–1.030)
UROBILINOGEN UA: 0.2 mg/dL (ref 0.0–1.0)

## 2015-01-29 LAB — BASIC METABOLIC PANEL
Anion gap: 12 (ref 5–15)
BUN: 9 mg/dL (ref 6–20)
CHLORIDE: 100 mmol/L — AB (ref 101–111)
CO2: 28 mmol/L (ref 22–32)
Calcium: 11.2 mg/dL — ABNORMAL HIGH (ref 8.9–10.3)
Creatinine, Ser: 1.47 mg/dL — ABNORMAL HIGH (ref 0.44–1.00)
GFR calc non Af Amer: 33 mL/min — ABNORMAL LOW (ref >60)
GFR, EST AFRICAN AMERICAN: 39 mL/min — AB (ref >60)
Glucose, Bld: 153 mg/dL — ABNORMAL HIGH (ref 65–99)
Potassium: 4.1 mmol/L (ref 3.5–5.1)
SODIUM: 140 mmol/L (ref 135–145)

## 2015-01-29 LAB — URINE CULTURE

## 2015-01-29 LAB — I-STAT CG4 LACTIC ACID, ED: Lactic Acid, Venous: 1.71 mmol/L (ref 0.5–2.0)

## 2015-01-29 MED ORDER — PHENAZOPYRIDINE HCL 100 MG PO TABS
95.0000 mg | ORAL_TABLET | Freq: Once | ORAL | Status: AC
  Administered 2015-01-29: 100 mg via ORAL
  Filled 2015-01-29: qty 1

## 2015-01-29 MED ORDER — SODIUM CHLORIDE 0.9 % IV BOLUS (SEPSIS)
1000.0000 mL | Freq: Once | INTRAVENOUS | Status: AC
  Administered 2015-01-29: 1000 mL via INTRAVENOUS

## 2015-01-29 MED ORDER — FLUCONAZOLE 200 MG PO TABS: 200.0000 mg | ORAL_TABLET | Freq: Once | ORAL | Status: DC

## 2015-01-29 MED ORDER — FLUCONAZOLE 100 MG PO TABS
200.0000 mg | ORAL_TABLET | Freq: Once | ORAL | Status: AC
  Administered 2015-01-29: 200 mg via ORAL
  Filled 2015-01-29: qty 2

## 2015-01-29 NOTE — Discharge Instructions (Signed)
Yeast Infection of the Skin Some yeast on the skin is normal, but sometimes it causes an infection. If you have a yeast infection, it shows up as white or light brown patches on brown skin. You can see it better in the summer on tan skin. It causes light-colored holes in your suntan. It can happen on any area of the body. This cannot be passed from person to person. HOME CARE  Scrub your skin daily with a dandruff shampoo. Your rash may take a couple weeks to get well.  Do not scratch or itch the rash. GET HELP RIGHT AWAY IF:   You get another infection from scratching. The skin may get warm, red, and may ooze fluid.  The infection does not seem to be getting better. MAKE SURE YOU:  Understand these instructions.  Will watch your condition.  Will get help right away if you are not doing well or get worse. Document Released: 06/27/2008 Document Revised: 10/07/2011 Document Reviewed: 06/27/2008 [employer] Patient Information 2015 [employer]. This information is not intended to replace advice given to you by your health care provider. Make sure you discuss any questions you have with your health care provider.  

## 2015-01-29 NOTE — ED Provider Notes (Signed)
CSN: [id number]     Arrival date & time 01/29/15  1217 History   First MD Initiated Contact with Patient 01/29/15 1239     Chief Complaint  Patient presents with  . Mouth Lesions  . Urinary Frequency     (Consider location/radiation/quality/duration/timing/severity/associated sxs/prior Treatment) HPI Comments: Patient here with lower abdominal pain, general malaise. She was initially hospitalized with UTI and received IV Rocephin for 3 days. She was sent home and then returned with persistent lower abdominal pain and burning. She had another UTI diagnosed with large leukocytes. Her previous culture showed Citrobacter sensitive to Cipro so she was started on Cipro 2 days ago. Cipro is not helping. Patient is continually feeling bad. She also developed mouth lesion so she's having difficulty eating. This began prior taking Cipro. There is one central lesion located centrally on the lower lip and it is inside the mouth.  Here she is tachycardic. Previous records says she had been tachycardic on her prior admission. At that time she also had a rising lactate despite fluid resuscitation, going from 1.8 to 3.1.  Patient is a 79 y.o. female presenting with mouth sores and frequency. The history is provided by the patient.  Mouth Lesions Location:  Lower lip Lower lip location:  R inner and L inner Quality:  White and painful Pain details:    Severity:  Mild   Duration:  3 days   Timing:  Constant   Progression:  Unchanged Onset quality:  Gradual Severity:  Mild Duration:  3 days Progression:  Worsening Chronicity:  New Context: change in medications (began Cipro 2 days ago, however were present prior to starting Cipro)   Relieved by:  Nothing Associated symptoms: no fever   Urinary Frequency Associated symptoms include abdominal pain. Pertinent negatives include no shortness of breath.    Past Medical History  Diagnosis Date  . Hypertension   . Chronic bronchitis     "get it just  about q yr" (04/07/2014)  . Anginal pain   . Exertional shortness of breath   . High cholesterol   . Type II diabetes mellitus   . GERD (gastroesophageal reflux disease)   . Headache(784.0)     "once or twice/week" (04/07/2014)  . Arthritis     "all over my body"    Past Surgical History  Procedure Laterality Date  . Back surgery    . Cholecystectomy  1990's  . Appendectomy  1969  . Lumbar disc surgery      "slipped disc" (12/11/2012)  . Shoulder surgery Left ~ 2009    "put cement down in it"   . Vaginal hysterectomy  ~ 1973  . Ectopic pregnancy surgery  1969   Family History  Problem Relation Age of Onset  . Hypertension Mother   . Heart attack Father   . Diabetes Mellitus II Sister   . Hypertension Sister   . Cancer - Other Sister    History  Substance Use Topics  . Smoking status: Never Smoker   . Smokeless tobacco: Never Used     Comment: 04/07/2014"1 pack of cigarettes would last 3-4 months; haven't had any cigarettes for 40 years"  . Alcohol Use: No     Comment: used to drink beer about 15 yrs ago   OB History    No data available     Review of Systems  Constitutional: Negative for fever.  HENT: Positive for mouth sores.   Respiratory: Negative for cough and shortness of breath.   Gastrointestinal:  Positive for abdominal pain. Negative for nausea and vomiting.  Genitourinary: Positive for frequency.  All other systems reviewed and are negative.     Allergies  Review of patient's allergies indicates no known allergies.  Home Medications   Prior to Admission medications   Medication Sig Start Date End Date Taking? Authorizing Provider  albuterol (PROVENTIL HFA;VENTOLIN HFA) 108 (90 BASE) MCG/ACT inhaler Inhale 2 puffs into the lungs every 6 (six) hours as needed for wheezing or shortness of breath.    Historical Provider, MD  aspirin EC 81 MG tablet Take 1 tablet (81 mg total) by mouth daily. 04/09/14   Debbe Odea, MD  atorvastatin (LIPITOR) 20 MG  tablet Take 20 mg by mouth every morning.  01/11/15   Historical Provider, MD  ciprofloxacin (CIPRO) 500 MG tablet Take 1 tablet (500 mg total) by mouth 2 (two) times daily. 01/27/15   Hanna Patel-Mills, PA-C  diazepam (VALIUM) 5 MG tablet take 1 tablet by mouth once daily if needed 01/05/15   Historical Provider, MD  fluticasone (FLONASE) 50 MCG/ACT nasal spray instill 2 sprays into each nostril once daily 01/05/15   Historical Provider, MD  folic acid (FOLVITE) 1 MG tablet Take 1 mg by mouth daily.    Historical Provider, MD  glimepiride (AMARYL) 2 MG tablet Take 2 mg by mouth daily with breakfast.  01/11/15   Historical Provider, MD  insulin glargine (LANTUS) 100 unit/mL SOPN Inject 0.05 mLs (5 Units total) into the skin daily at 10 pm. 01/24/15   Silver Huguenin Elgergawy, MD  JANUVIA 100 MG tablet Take 100 mg by mouth daily.  01/11/15   Historical Provider, MD  methotrexate (RHEUMATREX) 2.5 MG tablet Take 25 mg by mouth once a week. Take 10 tablets by mouth every Monday 01/12/15   Historical Provider, MD  metoprolol tartrate (LOPRESSOR) 25 MG tablet Take 1 tablet (25 mg total) by mouth 2 (two) times daily. 04/09/14   Debbe Odea, MD  promethazine (PHENERGAN) 25 MG tablet Take 25 mg by mouth every 6 (six) hours as needed for nausea or vomiting.  12/16/14   Historical Provider, MD  SUMAtriptan (IMITREX) 50 MG tablet Take 1 tablet (50 mg total) by mouth every 2 (two) hours as needed for migraine. Patient not taking: Reported on 01/22/2015 12/15/12   Verlee Monte, MD  traMADol (ULTRAM) 50 MG tablet Take 50 mg by mouth 2 (two) times daily as needed for pain.  05/04/12   Barbara Cower, MD  zolpidem (AMBIEN) 5 MG tablet Take 5 mg by mouth at bedtime as needed for sleep.  12/27/14   Historical Provider, MD   BP 102/53 mmHg  Pulse 126  Temp(Src) 97.8 F (36.6 C) (Oral)  Resp 17  Ht 5\' 2"  (1.575 m)  Wt 147 lb (66.679 kg)  BMI 26.88 kg/m2  SpO2 99% Physical Exam  Constitutional: She is oriented to person, place,  and time. She appears well-developed and well-nourished. No distress.  HENT:  Head: Normocephalic and atraumatic.  Mouth/Throat: Oropharynx is clear and moist.  Eyes: EOM are normal. Pupils are equal, round, and reactive to light.  Neck: Normal range of motion. Neck supple.  Cardiovascular: Regular rhythm.  Tachycardia present.  Exam reveals no friction rub.   No murmur heard. Pulmonary/Chest: Effort normal and breath sounds normal. No respiratory distress. She has no wheezes. She has no rales.  Abdominal: Soft. She exhibits no distension. There is tenderness. There is no rebound.  Musculoskeletal: Normal range of motion. She exhibits no edema.  Neurological: She is alert and oriented to person, place, and time.  Skin: No rash noted. She is not diaphoretic.  Nursing note and vitals reviewed.   ED Course  Procedures (including critical care time) Labs Review Labs Reviewed  BASIC METABOLIC PANEL - Abnormal; Notable for the following:    Chloride 100 (*)    Glucose, Bld 153 (*)    Creatinine, Ser 1.47 (*)    Calcium 11.2 (*)    GFR calc non Af Amer 33 (*)    GFR calc Af Amer 39 (*)    All other components within normal limits  URINALYSIS, ROUTINE W REFLEX MICROSCOPIC (NOT AT [facility]) - Abnormal; Notable for the following:    Bilirubin Urine SMALL (*)    Ketones, ur 40 (*)    All other components within normal limits  CBC  I-STAT CG4 LACTIC ACID, ED    Imaging Review Ct Renal Stone Study  01/29/2015   CLINICAL DATA:  Burning upon urination. Persistent urinary tract infection.  EXAM: CT ABDOMEN AND PELVIS WITHOUT CONTRAST  TECHNIQUE: Multidetector CT imaging of the abdomen and pelvis was performed following the standard protocol without IV contrast.  COMPARISON:  None.  FINDINGS: The liver is normal. Patient status post prior cholecystectomy. The common bile duct measures 1 cm at the pancreatic head, postsurgical dilatation. The pancreas is otherwise normal. The spleen is normal.  Bilateral adrenal glands are normal. There is no nephrolithiasis or hydroureteronephrosis bilaterally. There is no small bowel obstruction or diverticulitis. The appendix is not seen but no inflammation is noted around cecum.  Fluid-filled bladder is normal. The uterus is not seen. There is no pelvic lymphadenopathy. There are atelectasis of bilateral lung bases. Degenerative joint changes of the spine are noted. Patient status post vertebroplasty of T11 and T12.  IMPRESSION: No nephrolithiasis or hydroureteronephrosis bilaterally. Bladder is normal.   Electronically Signed   By: Abelardo Diesel M.D.   On: 01/29/2015 14:43     EKG Interpretation None     Angiocath insertion Performed by: Osvaldo Shipper  Consent: Verbal consent obtained. Risks and benefits: risks, benefits and alternatives were discussed Time out: Immediately prior to procedure a "time out" was called to verify the correct patient, procedure, equipment, support staff and site/side marked as required.  Preparation: Patient was prepped and draped in the usual sterile fashion.  Vein Location: R AC  Yes Ultrasound Guided  Gauge: 20  Images archived - 2  Normal blood return and flush without difficulty Patient tolerance: Patient tolerated the procedure well with no immediate complications.    MDM   Final diagnoses:  Lower abdominal pain  Tachycardia  Yeast infection  Lip ulcer    79 year old female here with persistent dysuria discomfort despite being on Anna biotics. She's had 2 courses anabiotic snow last week, initially was on Rocephin for 3 days and then on Cipro. She still having lower abdominal pain and discomfort. She is tachycardic here. We'll check labs and do a scan of her lower abdomen in case she has a possible kidney stone. CT normal. Patient's urine is clear. I advised her to stop taking the Cipro as it's giving her a yeast infection. Given Diflucan here. Given another pill in case symptoms do not  clear up. Patient is persistently tachycardic. She does have a history of tachycardia and multiple admissions in the past of a dresser tachycardia. She said occurred in the 1 teens, this has improved from the high 120s when she arrived. She was mildly  dehydrated on labs with creatinine of 1.47 and calcium of 11.2. I do not think this is a hypercalcemic crisis. Given 1 L normal saline. She's feeling and would like to go home. I think this is reasonable. Stable for discharge.   Evelina Bucy, MD 01/29/15 [phone]

## 2015-01-29 NOTE — ED Notes (Signed)
Patient presents to ed c/o sores in her mouth and burning and pain with urination. States she was seen last Sunday and admitted to the hospital until Tues. Wasn't given a Rx.  Came back to the ED on "Friday and was given a Rx. For antibiotics states she has been taking as directed. States she noticed sores in her mouth on Wed. Didn't tell the MD on Fri. She thought they would go away.

## 2015-01-29 NOTE — ED Notes (Signed)
Pt. Stated, I was given medicine for my urinary problems and I still have it.  burring when I urinate.  I've developed all these sores in my mouth and its dry .

## 2015-01-31 DIAGNOSIS — E784 Other hyperlipidemia: Secondary | ICD-10-CM | POA: Diagnosis not present

## 2015-01-31 DIAGNOSIS — E119 Type 2 diabetes mellitus without complications: Secondary | ICD-10-CM | POA: Diagnosis not present

## 2015-01-31 DIAGNOSIS — N390 Urinary tract infection, site not specified: Secondary | ICD-10-CM | POA: Diagnosis not present

## 2015-01-31 DIAGNOSIS — I10 Essential (primary) hypertension: Secondary | ICD-10-CM | POA: Diagnosis not present

## 2015-01-31 DIAGNOSIS — N179 Acute kidney failure, unspecified: Secondary | ICD-10-CM | POA: Diagnosis not present

## 2015-02-28 DIAGNOSIS — E1165 Type 2 diabetes mellitus with hyperglycemia: Secondary | ICD-10-CM | POA: Diagnosis not present

## 2015-02-28 DIAGNOSIS — I10 Essential (primary) hypertension: Secondary | ICD-10-CM | POA: Diagnosis not present

## 2015-02-28 DIAGNOSIS — I951 Orthostatic hypotension: Secondary | ICD-10-CM | POA: Diagnosis not present

## 2015-02-28 DIAGNOSIS — J449 Chronic obstructive pulmonary disease, unspecified: Secondary | ICD-10-CM | POA: Diagnosis not present

## 2015-03-02 DIAGNOSIS — N390 Urinary tract infection, site not specified: Secondary | ICD-10-CM | POA: Diagnosis not present

## 2015-03-06 DIAGNOSIS — M0589 Other rheumatoid arthritis with rheumatoid factor of multiple sites: Secondary | ICD-10-CM | POA: Diagnosis not present

## 2015-03-07 DIAGNOSIS — E1165 Type 2 diabetes mellitus with hyperglycemia: Secondary | ICD-10-CM | POA: Diagnosis not present

## 2015-03-07 DIAGNOSIS — E784 Other hyperlipidemia: Secondary | ICD-10-CM | POA: Diagnosis not present

## 2015-03-07 DIAGNOSIS — M069 Rheumatoid arthritis, unspecified: Secondary | ICD-10-CM | POA: Diagnosis not present

## 2015-03-07 DIAGNOSIS — I10 Essential (primary) hypertension: Secondary | ICD-10-CM | POA: Diagnosis not present

## 2015-03-07 DIAGNOSIS — J449 Chronic obstructive pulmonary disease, unspecified: Secondary | ICD-10-CM | POA: Diagnosis not present

## 2015-03-10 ENCOUNTER — Emergency Department (HOSPITAL_COMMUNITY): Payer: Medicare Other

## 2015-03-10 ENCOUNTER — Emergency Department (HOSPITAL_COMMUNITY)
Admission: EM | Admit: 2015-03-10 | Discharge: 2015-03-10 | Disposition: A | Discharge status: Home / Self Care | Payer: Medicare Other | Attending: Emergency Medicine | Admitting: Emergency Medicine

## 2015-03-10 ENCOUNTER — Encounter (HOSPITAL_COMMUNITY): Payer: Self-pay | Admitting: *Deleted

## 2015-03-10 DIAGNOSIS — R740 Nonspecific elevation of levels of transaminase and lactic acid dehydrogenase [LDH]: Secondary | ICD-10-CM | POA: Diagnosis not present

## 2015-03-10 DIAGNOSIS — K838 Other specified diseases of biliary tract: Secondary | ICD-10-CM | POA: Diagnosis not present

## 2015-03-10 DIAGNOSIS — E119 Type 2 diabetes mellitus without complications: Secondary | ICD-10-CM | POA: Insufficient documentation

## 2015-03-10 DIAGNOSIS — E782 Mixed hyperlipidemia: Secondary | ICD-10-CM | POA: Diagnosis not present

## 2015-03-10 DIAGNOSIS — R0602 Shortness of breath: Secondary | ICD-10-CM | POA: Diagnosis not present

## 2015-03-10 DIAGNOSIS — Z7982 Long term (current) use of aspirin: Secondary | ICD-10-CM | POA: Diagnosis not present

## 2015-03-10 DIAGNOSIS — J028 Acute pharyngitis due to other specified organisms: Secondary | ICD-10-CM | POA: Diagnosis not present

## 2015-03-10 DIAGNOSIS — I10 Essential (primary) hypertension: Secondary | ICD-10-CM | POA: Diagnosis not present

## 2015-03-10 DIAGNOSIS — Z79899 Other long term (current) drug therapy: Secondary | ICD-10-CM | POA: Insufficient documentation

## 2015-03-10 DIAGNOSIS — M1990 Unspecified osteoarthritis, unspecified site: Secondary | ICD-10-CM | POA: Diagnosis not present

## 2015-03-10 DIAGNOSIS — J029 Acute pharyngitis, unspecified: Secondary | ICD-10-CM | POA: Insufficient documentation

## 2015-03-10 DIAGNOSIS — I209 Angina pectoris, unspecified: Secondary | ICD-10-CM | POA: Insufficient documentation

## 2015-03-10 DIAGNOSIS — R079 Chest pain, unspecified: Secondary | ICD-10-CM | POA: Insufficient documentation

## 2015-03-10 DIAGNOSIS — Z7951 Long term (current) use of inhaled steroids: Secondary | ICD-10-CM | POA: Diagnosis not present

## 2015-03-10 DIAGNOSIS — Z794 Long term (current) use of insulin: Secondary | ICD-10-CM | POA: Diagnosis not present

## 2015-03-10 DIAGNOSIS — K219 Gastro-esophageal reflux disease without esophagitis: Secondary | ICD-10-CM | POA: Diagnosis not present

## 2015-03-10 DIAGNOSIS — R7401 Elevation of levels of liver transaminase levels: Secondary | ICD-10-CM

## 2015-03-10 LAB — I-STAT TROPONIN, ED
TROPONIN I, POC: 0.01 ng/mL (ref 0.00–0.08)
TROPONIN I, POC: 0 ng/mL (ref 0.00–0.08)

## 2015-03-10 LAB — COMPREHENSIVE METABOLIC PANEL
ALK PHOS: 86 U/L (ref 38–126)
ALT: 175 U/L — ABNORMAL HIGH (ref 14–54)
AST: 311 U/L — ABNORMAL HIGH (ref 15–41)
Albumin: 3.4 g/dL — ABNORMAL LOW (ref 3.5–5.0)
Anion gap: 11 (ref 5–15)
BUN: 10 mg/dL (ref 6–20)
CO2: 27 mmol/L (ref 22–32)
Calcium: 9.4 mg/dL (ref 8.9–10.3)
Chloride: 100 mmol/L — ABNORMAL LOW (ref 101–111)
Creatinine, Ser: 1.39 mg/dL — ABNORMAL HIGH (ref 0.44–1.00)
GFR calc non Af Amer: 36 mL/min — ABNORMAL LOW (ref >60)
GFR, EST AFRICAN AMERICAN: 42 mL/min — AB (ref >60)
GLUCOSE: 178 mg/dL — AB (ref 65–99)
POTASSIUM: 3.5 mmol/L (ref 3.5–5.1)
Sodium: 138 mmol/L (ref 135–145)
Total Bilirubin: 1.1 mg/dL (ref 0.3–1.2)
Total Protein: 6.8 g/dL (ref 6.5–8.1)

## 2015-03-10 LAB — URINALYSIS, ROUTINE W REFLEX MICROSCOPIC
GLUCOSE, UA: NEGATIVE mg/dL
Hgb urine dipstick: NEGATIVE
KETONES UR: NEGATIVE mg/dL
Nitrite: NEGATIVE
Protein, ur: NEGATIVE mg/dL
SPECIFIC GRAVITY, URINE: 1.015 (ref 1.005–1.030)
Urobilinogen, UA: 1 mg/dL (ref 0.0–1.0)
pH: 5 (ref 5.0–8.0)

## 2015-03-10 LAB — URINE MICROSCOPIC-ADD ON

## 2015-03-10 LAB — CBC WITH DIFFERENTIAL/PLATELET
Basophils Absolute: 0.1 10*3/uL (ref 0.0–0.1)
Basophils Relative: 1 % (ref 0–1)
EOS ABS: 0.1 10*3/uL (ref 0.0–0.7)
Eosinophils Relative: 1 % (ref 0–5)
HCT: 37.3 % (ref 36.0–46.0)
HEMOGLOBIN: 12.1 g/dL (ref 12.0–15.0)
Lymphocytes Relative: 22 % (ref 12–46)
Lymphs Abs: 1.8 10*3/uL (ref 0.7–4.0)
MCH: 30.1 pg (ref 26.0–34.0)
MCHC: 32.4 g/dL (ref 30.0–36.0)
MCV: 92.8 fL (ref 78.0–100.0)
MONOS PCT: 1 % — AB (ref 3–12)
Monocytes Absolute: 0 10*3/uL — ABNORMAL LOW (ref 0.1–1.0)
NEUTROS PCT: 75 % (ref 43–77)
Neutro Abs: 6.2 10*3/uL (ref 1.7–7.7)
PLATELETS: 527 10*3/uL — AB (ref 150–400)
RBC: 4.02 MIL/uL (ref 3.87–5.11)
RDW: 16.4 % — ABNORMAL HIGH (ref 11.5–15.5)
WBC: 8.2 10*3/uL (ref 4.0–10.5)

## 2015-03-10 LAB — RAPID STREP SCREEN (MED CTR MEBANE ONLY): Streptococcus, Group A Screen (Direct): NEGATIVE

## 2015-03-10 MED ORDER — SODIUM CHLORIDE 0.9 % IV BOLUS (SEPSIS)
500.0000 mL | Freq: Once | INTRAVENOUS | Status: AC
  Administered 2015-03-10: 500 mL via INTRAVENOUS

## 2015-03-10 MED ORDER — MORPHINE SULFATE 4 MG/ML IJ SOLN
4.0000 mg | Freq: Once | INTRAMUSCULAR | Status: AC
  Administered 2015-03-10: 4 mg via INTRAVENOUS
  Filled 2015-03-10: qty 1

## 2015-03-10 MED ORDER — SODIUM CHLORIDE 0.9 % IV BOLUS (SEPSIS)
1000.0000 mL | Freq: Once | INTRAVENOUS | Status: AC
  Administered 2015-03-10: 1000 mL via INTRAVENOUS

## 2015-03-10 MED ORDER — FLUCONAZOLE 200 MG PO TABS: 200.0000 mg | ORAL_TABLET | Freq: Every day | ORAL | Status: AC

## 2015-03-10 MED ORDER — ONDANSETRON HCL 4 MG/2ML IJ SOLN
4.0000 mg | Freq: Once | INTRAMUSCULAR | Status: AC
  Administered 2015-03-10: 4 mg via INTRAVENOUS
  Filled 2015-03-10: qty 2

## 2015-03-10 NOTE — Discharge Instructions (Signed)
Return to the ED with any concerns including difficulty breathing, vomiting and not able to keep down liquids, fever/chills, abdominal pain, decreased level of alertness/lethargy, or any other alarming symptoms  Your liver function tests were elevated, the ultrasound showed fatty liver- you should have these tests rechecked by your primary care doctor in the next week

## 2015-03-10 NOTE — ED Notes (Signed)
MD Linker made aware that patients BP was rechecked multiple times and lower at 99/43 with a HR around 111.

## 2015-03-10 NOTE — ED Provider Notes (Signed)
CSN: [id number]     Arrival date & time 03/10/15  7262 History   First MD Initiated Contact with Patient 03/10/15 [phone]     Chief Complaint  Patient presents with  . Sore Throat  . Chest Pain     (Consider location/radiation/quality/duration/timing/severity/associated sxs/prior Treatment) HPI  Pt presenting with c/o sore throat.  She states she woke up with sore throat yesterday morning.  She also has thrush on her tongue- she was treated approx 1 month ago with diflucan and thrush resolved, but then recurred.  No significant cough.  She also c/o post nasal drainage and bilateral ear pain. No fever/chills.  No abdominal pain or vomiting.  Also has mild frontal headache. Symptoms are constant.  She has not had any treatment prior to arrival.  There are no other associated systemic symptoms, there are no other alleviating or modifying factors.   Past Medical History  Diagnosis Date  . Hypertension   . Chronic bronchitis     "get it just about q yr" (04/07/2014)  . Anginal pain   . Exertional shortness of breath   . High cholesterol   . Type II diabetes mellitus   . GERD (gastroesophageal reflux disease)   . Headache(784.0)     "once or twice/week" (04/07/2014)  . Arthritis     "all over my body"    Past Surgical History  Procedure Laterality Date  . Back surgery    . Cholecystectomy  1990's  . Appendectomy  1969  . Lumbar disc surgery      "slipped disc" (12/11/2012)  . Shoulder surgery Left ~ 2009    "put cement down in it"   . Vaginal hysterectomy  ~ 1973  . Ectopic pregnancy surgery  1969   Family History  Problem Relation Age of Onset  . Hypertension Mother   . Heart attack Father   . Diabetes Mellitus II Sister   . Hypertension Sister   . Cancer - Other Sister    Social History  Substance Use Topics  . Smoking status: Never Smoker   . Smokeless tobacco: Never Used     Comment: 04/07/2014"1 pack of cigarettes would last 3-4 months; haven't had any cigarettes for 40  years"  . Alcohol Use: No     Comment: used to drink beer about 15 yrs ago   OB History    No data available     Review of Systems  ROS reviewed and all otherwise negative except for mentioned in HPI    Allergies  Review of patient's allergies indicates no known allergies.  Home Medications   Prior to Admission medications   Medication Sig Start Date End Date Taking? Authorizing Provider  albuterol (PROVENTIL HFA;VENTOLIN HFA) 108 (90 BASE) MCG/ACT inhaler Inhale 2 puffs into the lungs every 6 (six) hours as needed for wheezing or shortness of breath.   Yes Historical Provider, MD  aspirin EC 81 MG tablet Take 1 tablet (81 mg total) by mouth daily. 04/09/14  Yes Debbe Odea, MD  atorvastatin (LIPITOR) 20 MG tablet Take 20 mg by mouth every morning.  01/11/15  Yes Historical Provider, MD  fluticasone (FLONASE) 50 MCG/ACT nasal spray instill 2 sprays into each nostril once daily 01/05/15  Yes Historical Provider, MD  folic acid (FOLVITE) 1 MG tablet Take 1 mg by mouth daily.   Yes Historical Provider, MD  glimepiride (AMARYL) 2 MG tablet Take 2 mg by mouth daily with breakfast.  01/11/15  Yes Historical Provider, MD  insulin glargine (  LANTUS) 100 unit/mL SOPN Inject 0.05 mLs (5 Units total) into the skin daily at 10 pm. 01/24/15  Yes Silver Huguenin Elgergawy, MD  JANUVIA 100 MG tablet Take 100 mg by mouth daily.  01/11/15  Yes Historical Provider, MD  lisinopril-hydrochlorothiazide (PRINZIDE,ZESTORETIC) 20-12.5 MG per tablet Take 1 tablet by mouth daily. 02/27/15  Yes Historical Provider, MD  methotrexate (RHEUMATREX) 2.5 MG tablet Take 25 mg by mouth once a week. Take 10 tablets by mouth every Monday 01/12/15  Yes Historical Provider, MD  metoprolol tartrate (LOPRESSOR) 25 MG tablet Take 1 tablet (25 mg total) by mouth 2 (two) times daily. 04/09/14  Yes Debbe Odea, MD  traMADol (ULTRAM) 50 MG tablet Take 50 mg by mouth 2 (two) times daily as needed for pain.  05/04/12  Yes Barbara Cower, MD   zolpidem (AMBIEN) 5 MG tablet Take 5 mg by mouth at bedtime as needed for sleep.  12/27/14  Yes Historical Provider, MD  ciprofloxacin (CIPRO) 500 MG tablet Take 1 tablet (500 mg total) by mouth 2 (two) times daily. Patient not taking: Reported on 03/10/2015 01/27/15   Ottie Glazier, PA-C  fluconazole (DIFLUCAN) 200 MG tablet Take 1 tablet (200 mg total) by mouth daily. 03/10/15 03/17/15  Alfonzo Beers, MD  SUMAtriptan (IMITREX) 50 MG tablet Take 1 tablet (50 mg total) by mouth every 2 (two) hours as needed for migraine. Patient not taking: Reported on 01/22/2015 12/15/12   Verlee Monte, MD   BP 100/62 mmHg  Pulse 111  Temp(Src) 98.7 F (37.1 C) (Oral)  Resp 16  Ht 5\' 4"  (1.626 m)  Wt 147 lb (66.679 kg)  BMI 25.22 kg/m2  SpO2 100%  Vitals reviewed Physical Exam  Physical Examination: General appearance - alert, well appearing, and in no distress Mental status - alert, oriented to person, place, and time Eyes - pupils equal and reactive, extraocular eye movements intact Mouth - mucous membranes moist, pharynx normal without lesions, mild erythema of posterior OP, uvula midline, palate symmetric Chest - clear to auscultation, no wheezes, rales or rhonchi, symmetric air entry Heart - normal rate, regular rhythm, normal S1, S2, no murmurs, rubs, clicks or gallops Abdomen - soft, nontender, nondistended, no masses or organomegaly Neurological - alert, oriented, normal speech, strength 5/5 in extremities x 4, sensation intact Extremities - peripheral pulses normal, no pedal edema, no clubbing or cyanosis Skin - normal coloration and turgor, no rashes  ED Course  Procedures (including critical care time) Labs Review Labs Reviewed  CBC WITH DIFFERENTIAL/PLATELET - Abnormal; Notable for the following:    RDW 16.4 (*)    Platelets 527 (*)    Monocytes Relative 1 (*)    Monocytes Absolute 0.0 (*)    All other components within normal limits  COMPREHENSIVE METABOLIC PANEL - Abnormal; Notable  for the following:    Chloride 100 (*)    Glucose, Bld 178 (*)    Creatinine, Ser 1.39 (*)    Albumin 3.4 (*)    AST 311 (*)    ALT 175 (*)    GFR calc non Af Amer 36 (*)    GFR calc Af Amer 42 (*)    All other components within normal limits  URINALYSIS, ROUTINE W REFLEX MICROSCOPIC (NOT AT [facility]) - Abnormal; Notable for the following:    Color, Urine AMBER (*)    APPearance CLOUDY (*)    Bilirubin Urine SMALL (*)    Leukocytes, UA MODERATE (*)    All other components within normal limits  URINE MICROSCOPIC-ADD ON - Abnormal; Notable for the following:    Squamous Epithelial / LPF FEW (*)    Casts HYALINE CASTS (*)    All other components within normal limits  RAPID STREP SCREEN (NOT AT [facility])  CULTURE, GROUP A STREP  I-STAT TROPOININ, ED  I-STAT TROPOININ, ED    Imaging Review No results found. Dagoberto Ligas, personally reviewed and evaluated these images and lab results as part of my medical decision-making.   EKG Interpretation   Date/Time:  Friday March 10 2015 09:50:20 EDT Ventricular Rate:  124 PR Interval:  134 QRS Duration: 83 QT Interval:  335 QTC Calculation: 481 R Axis:   -50 Text Interpretation:  Sinus tachycardia LAD, consider left anterior  fascicular block Nonspecific T abnormalities, lateral leads No significant  change since last tracing Confirmed by [facility]  MD, Jizel Cheeks [phone]) on  03/10/2015 12:09:35 PM      MDM   Final diagnoses:  Viral pharyngitis  Transaminitis    Pt presenting with sore throat and nasal congestion, workup reassuring including strep test negative.  Per chart review patient is tachycardic at baseline, EKg is reassuring.  2 sets of troponins checked and negative, CXR reassuring.  Her LFTs were elevated, but no abdominal tenderness on exam- ultrasound obtained and shows fatty liver- no other acute findings.  Results d/w patient and son at the bedside and they will have her liver tests rechecked,  Note- low bp likely not  accurate, when blood pressure cuff adjusted and placed on arm correctly blood pressure normalized without treatment.   Alfonzo Beers, MD 03/13/15 [phone]

## 2015-03-10 NOTE — ED Notes (Signed)
Patient transported to Ultrasound 

## 2015-03-10 NOTE — ED Notes (Signed)
Pt states she began having a sore throat yesterday and has c/o post nasal drainage.

## 2015-03-10 NOTE — ED Notes (Signed)
Patient transported to X-ray 

## 2015-03-12 LAB — CULTURE, GROUP A STREP: Strep A Culture: NEGATIVE

## 2015-03-13 ENCOUNTER — Emergency Department (HOSPITAL_COMMUNITY)
Admission: EM | Admit: 2015-03-13 | Discharge: 2015-03-14 | Disposition: A | Discharge status: Home / Self Care | Payer: Medicare Other | Attending: Emergency Medicine | Admitting: Emergency Medicine

## 2015-03-13 ENCOUNTER — Encounter (HOSPITAL_COMMUNITY): Payer: Self-pay | Admitting: *Deleted

## 2015-03-13 DIAGNOSIS — Z8709 Personal history of other diseases of the respiratory system: Secondary | ICD-10-CM | POA: Insufficient documentation

## 2015-03-13 DIAGNOSIS — Z794 Long term (current) use of insulin: Secondary | ICD-10-CM | POA: Insufficient documentation

## 2015-03-13 DIAGNOSIS — E119 Type 2 diabetes mellitus without complications: Secondary | ICD-10-CM | POA: Insufficient documentation

## 2015-03-13 DIAGNOSIS — K121 Other forms of stomatitis: Secondary | ICD-10-CM | POA: Diagnosis not present

## 2015-03-13 DIAGNOSIS — I10 Essential (primary) hypertension: Secondary | ICD-10-CM | POA: Insufficient documentation

## 2015-03-13 DIAGNOSIS — Z7982 Long term (current) use of aspirin: Secondary | ICD-10-CM | POA: Diagnosis not present

## 2015-03-13 DIAGNOSIS — K219 Gastro-esophageal reflux disease without esophagitis: Secondary | ICD-10-CM | POA: Insufficient documentation

## 2015-03-13 DIAGNOSIS — K1379 Other lesions of oral mucosa: Secondary | ICD-10-CM | POA: Insufficient documentation

## 2015-03-13 DIAGNOSIS — Z7951 Long term (current) use of inhaled steroids: Secondary | ICD-10-CM | POA: Insufficient documentation

## 2015-03-13 DIAGNOSIS — Z79899 Other long term (current) drug therapy: Secondary | ICD-10-CM | POA: Insufficient documentation

## 2015-03-13 DIAGNOSIS — E780 Pure hypercholesterolemia: Secondary | ICD-10-CM | POA: Diagnosis not present

## 2015-03-13 DIAGNOSIS — M158 Other polyosteoarthritis: Secondary | ICD-10-CM | POA: Diagnosis not present

## 2015-03-13 DIAGNOSIS — K088 Other specified disorders of teeth and supporting structures: Secondary | ICD-10-CM | POA: Diagnosis not present

## 2015-03-13 NOTE — ED Notes (Signed)
Bed: WTR8 Expected date:  Expected time:  Means of arrival:  Comments: EMS 

## 2015-03-14 MED ORDER — IBUPROFEN 800 MG PO TABS: 800.0000 mg | ORAL_TABLET | Freq: Three times a day (TID) | ORAL | Status: DC

## 2015-03-14 MED ORDER — MAGIC MOUTHWASH W/LIDOCAINE: 5.0000 mL | Freq: Three times a day (TID) | ORAL | Status: DC | PRN

## 2015-03-14 NOTE — ED Provider Notes (Signed)
CSN: [id number]     Arrival date & time 03/13/15  2344 History   First MD Initiated Contact with Patient 03/13/15 2356     Chief Complaint  Patient presents with  . Dental Pain     (Consider location/radiation/quality/duration/timing/severity/associated sxs/prior Treatment) Patient is a 79 y.o. female presenting with tooth pain. The history is provided by the patient. No language interpreter was used.  Dental Pain Location:  Generalized Quality:  Burning Severity:  Severe Onset quality:  Gradual Duration:  4 days Timing:  Constant Progression:  Worsening Chronicity:  New Context: not abscess, cap still on, not dental caries, not dental fracture, not enamel fracture, normal dentition, not recent dental surgery and not trauma   Relieved by:  Nothing Worsened by:  Nothing tried Ineffective treatments:  NSAIDs and acetaminophen Associated symptoms: oral lesions   Risk factors: diabetes and lack of dental care   Risk factors: no alcohol problem, no cancer, no immunosuppression, no periodontal disease and no smoking     Past Medical History  Diagnosis Date  . Hypertension   . Chronic bronchitis     "get it just about q yr" (04/07/2014)  . Anginal pain   . Exertional shortness of breath   . High cholesterol   . Type II diabetes mellitus   . GERD (gastroesophageal reflux disease)   . Headache(784.0)     "once or twice/week" (04/07/2014)  . Arthritis     "all over my body"    Past Surgical History  Procedure Laterality Date  . Back surgery    . Cholecystectomy  1990's  . Appendectomy  1969  . Lumbar disc surgery      "slipped disc" (12/11/2012)  . Shoulder surgery Left ~ 2009    "put cement down in it"   . Vaginal hysterectomy  ~ 1973  . Ectopic pregnancy surgery  1969   Family History  Problem Relation Age of Onset  . Hypertension Mother   . Heart attack Father   . Diabetes Mellitus II Sister   . Hypertension Sister   . Cancer - Other Sister    Social History   Substance Use Topics  . Smoking status: Never Smoker   . Smokeless tobacco: Never Used     Comment: 04/07/2014"1 pack of cigarettes would last 3-4 months; haven't had any cigarettes for 40 years"  . Alcohol Use: No     Comment: used to drink beer about 15 yrs ago   OB History    No data available     Review of Systems  HENT: Positive for mouth sores.        Mouth pain   All other systems reviewed and are negative.     Allergies  Review of patient's allergies indicates no known allergies.  Home Medications   Prior to Admission medications   Medication Sig Start Date End Date Taking? Authorizing Provider  albuterol (PROVENTIL HFA;VENTOLIN HFA) 108 (90 BASE) MCG/ACT inhaler Inhale 2 puffs into the lungs every 6 (six) hours as needed for wheezing or shortness of breath.    Historical Provider, MD  aspirin EC 81 MG tablet Take 1 tablet (81 mg total) by mouth daily. 04/09/14   Debbe Odea, MD  atorvastatin (LIPITOR) 20 MG tablet Take 20 mg by mouth every morning.  01/11/15   Historical Provider, MD  ciprofloxacin (CIPRO) 500 MG tablet Take 1 tablet (500 mg total) by mouth 2 (two) times daily. Patient not taking: Reported on 03/10/2015 01/27/15   Ottie Glazier, PA-C  fluconazole (DIFLUCAN) 200 MG tablet Take 1 tablet (200 mg total) by mouth daily. 03/10/15 03/17/15  Alfonzo Beers, MD  fluticasone [facility]) 50 MCG/ACT nasal spray instill 2 sprays into each nostril once daily 01/05/15   Historical Provider, MD  folic acid (FOLVITE) 1 MG tablet Take 1 mg by mouth daily.    Historical Provider, MD  glimepiride (AMARYL) 2 MG tablet Take 2 mg by mouth daily with breakfast.  01/11/15   Historical Provider, MD  insulin glargine (LANTUS) 100 unit/mL SOPN Inject 0.05 mLs (5 Units total) into the skin daily at 10 pm. 01/24/15   Silver Huguenin Elgergawy, MD  JANUVIA 100 MG tablet Take 100 mg by mouth daily.  01/11/15   Historical Provider, MD  lisinopril-hydrochlorothiazide (PRINZIDE,ZESTORETIC) 20-12.5 MG  per tablet Take 1 tablet by mouth daily. 02/27/15   Historical Provider, MD  methotrexate (RHEUMATREX) 2.5 MG tablet Take 25 mg by mouth once a week. Take 10 tablets by mouth every Monday 01/12/15   Historical Provider, MD  metoprolol tartrate (LOPRESSOR) 25 MG tablet Take 1 tablet (25 mg total) by mouth 2 (two) times daily. 04/09/14   Debbe Odea, MD  SUMAtriptan (IMITREX) 50 MG tablet Take 1 tablet (50 mg total) by mouth every 2 (two) hours as needed for migraine. Patient not taking: Reported on 01/22/2015 12/15/12   Verlee Monte, MD  traMADol (ULTRAM) 50 MG tablet Take 50 mg by mouth 2 (two) times daily as needed for pain.  05/04/12   Barbara Cower, MD  zolpidem (AMBIEN) 5 MG tablet Take 5 mg by mouth at bedtime as needed for sleep.  12/27/14   Historical Provider, MD   BP 122/63 mmHg  Pulse 110  Temp(Src) 98.7 F (37.1 C) (Oral)  Resp 18  SpO2 100% Physical Exam  Constitutional: She is oriented to person, place, and time. She appears well-developed and well-nourished. No distress.  HENT:  Head: Normocephalic and atraumatic.  Ulcerative lesion of the left buccal mucosa and left tongue.   Eyes: Conjunctivae and EOM are normal.  Neck: Normal range of motion.  Cardiovascular: Normal rate and regular rhythm.  Exam reveals no gallop and no friction rub.   No murmur heard. Pulmonary/Chest: Effort normal and breath sounds normal. She has no wheezes. She has no rales. She exhibits no tenderness.  Abdominal: Soft. She exhibits no distension. There is no tenderness. There is no rebound.  Musculoskeletal: Normal range of motion.  Neurological: She is alert and oriented to person, place, and time. Coordination normal.  Speech is goal-oriented. Moves limbs without ataxia.   Skin: Skin is warm and dry.  Psychiatric: She has a normal mood and affect. Her behavior is normal.  Nursing note and vitals reviewed.   ED Course  Procedures (including critical care time) Labs Review Labs Reviewed - No  data to display  Imaging Review No results found. Wayland Denis, personally reviewed and evaluated these images and lab results as part of my medical decision-making.   EKG Interpretation None      MDM   Final diagnoses:  Mouth ulcer    12:32 AM Patient with ulcer of buccal mucosa and tongue. Patient will have magic mouthwash and be discharged with instructions to follow up with PCP.     Alvina Chou, PA-C 03/14/15 [id number]  Varney Biles, MD 03/16/15 1012

## 2015-03-14 NOTE — Discharge Instructions (Signed)
Use magic mouthwash as needed for pain. Follow up with Dr. Junita Push as needed. Refer to attached documents for more information.

## 2015-03-23 ENCOUNTER — Emergency Department (HOSPITAL_COMMUNITY)
Admission: EM | Admit: 2015-03-23 | Discharge: 2015-03-23 | Disposition: A | Discharge status: Home / Self Care | Payer: Medicare Other | Attending: Emergency Medicine | Admitting: Emergency Medicine

## 2015-03-23 ENCOUNTER — Emergency Department (HOSPITAL_COMMUNITY): Payer: Medicare Other

## 2015-03-23 ENCOUNTER — Encounter (HOSPITAL_COMMUNITY): Payer: Self-pay | Admitting: Family Medicine

## 2015-03-23 DIAGNOSIS — Z794 Long term (current) use of insulin: Secondary | ICD-10-CM | POA: Insufficient documentation

## 2015-03-23 DIAGNOSIS — E782 Mixed hyperlipidemia: Secondary | ICD-10-CM | POA: Insufficient documentation

## 2015-03-23 DIAGNOSIS — K219 Gastro-esophageal reflux disease without esophagitis: Secondary | ICD-10-CM | POA: Insufficient documentation

## 2015-03-23 DIAGNOSIS — Z7951 Long term (current) use of inhaled steroids: Secondary | ICD-10-CM | POA: Insufficient documentation

## 2015-03-23 DIAGNOSIS — I10 Essential (primary) hypertension: Secondary | ICD-10-CM | POA: Insufficient documentation

## 2015-03-23 DIAGNOSIS — Z79899 Other long term (current) drug therapy: Secondary | ICD-10-CM | POA: Diagnosis not present

## 2015-03-23 DIAGNOSIS — Z7982 Long term (current) use of aspirin: Secondary | ICD-10-CM | POA: Diagnosis not present

## 2015-03-23 DIAGNOSIS — I209 Angina pectoris, unspecified: Secondary | ICD-10-CM | POA: Diagnosis not present

## 2015-03-23 DIAGNOSIS — E119 Type 2 diabetes mellitus without complications: Secondary | ICD-10-CM | POA: Diagnosis not present

## 2015-03-23 DIAGNOSIS — M1990 Unspecified osteoarthritis, unspecified site: Secondary | ICD-10-CM | POA: Diagnosis not present

## 2015-03-23 DIAGNOSIS — R079 Chest pain, unspecified: Secondary | ICD-10-CM | POA: Diagnosis not present

## 2015-03-23 LAB — BASIC METABOLIC PANEL
Anion gap: 11 (ref 5–15)
BUN: 16 mg/dL (ref 6–20)
CHLORIDE: 103 mmol/L (ref 101–111)
CO2: 26 mmol/L (ref 22–32)
CREATININE: 1.45 mg/dL — AB (ref 0.44–1.00)
Calcium: 10.7 mg/dL — ABNORMAL HIGH (ref 8.9–10.3)
GFR, EST AFRICAN AMERICAN: 39 mL/min — AB (ref >60)
GFR, EST NON AFRICAN AMERICAN: 34 mL/min — AB (ref >60)
Glucose, Bld: 188 mg/dL — ABNORMAL HIGH (ref 65–99)
POTASSIUM: 3.9 mmol/L (ref 3.5–5.1)
SODIUM: 140 mmol/L (ref 135–145)

## 2015-03-23 LAB — CBC
HEMATOCRIT: 36.4 % (ref 36.0–46.0)
Hemoglobin: 11.9 g/dL — ABNORMAL LOW (ref 12.0–15.0)
MCH: 30.5 pg (ref 26.0–34.0)
MCHC: 32.7 g/dL (ref 30.0–36.0)
MCV: 93.3 fL (ref 78.0–100.0)
PLATELETS: 413 10*3/uL — AB (ref 150–400)
RBC: 3.9 MIL/uL (ref 3.87–5.11)
RDW: 16.3 % — AB (ref 11.5–15.5)
WBC: 4.5 10*3/uL (ref 4.0–10.5)

## 2015-03-23 LAB — I-STAT TROPONIN, ED: Troponin i, poc: 0 ng/mL (ref 0.00–0.08)

## 2015-03-23 MED ORDER — SODIUM CHLORIDE 0.9 % IV BOLUS (SEPSIS)
500.0000 mL | Freq: Once | INTRAVENOUS | Status: AC
  Administered 2015-03-23: 500 mL via INTRAVENOUS

## 2015-03-23 MED ORDER — SODIUM CHLORIDE 0.9 % IV BOLUS (SEPSIS): 1000.0000 mL | Freq: Once | INTRAVENOUS | Status: DC

## 2015-03-23 NOTE — ED Provider Notes (Signed)
CSN: [id number]     Arrival date & time 03/23/15  1412 History   First MD Initiated Contact with Patient 03/23/15 1439     Chief Complaint  Patient presents with  . Chest Pain     (Consider location/radiation/quality/duration/timing/severity/associated sxs/prior Treatment) HPI.... Patient complains of intermittent chest pain for 1-1/2 years with associated dizziness and dyspnea. She has a primary care relationship. Symptoms are unchanged from her normal complaints. No associated with any activity. Severity is mild.  Past Medical History  Diagnosis Date  . Hypertension   . Chronic bronchitis     "get it just about q yr" (04/07/2014)  . Anginal pain   . Exertional shortness of breath   . High cholesterol   . Type II diabetes mellitus   . GERD (gastroesophageal reflux disease)   . Headache(784.0)     "once or twice/week" (04/07/2014)  . Arthritis     "all over my body"    Past Surgical History  Procedure Laterality Date  . Back surgery    . Cholecystectomy  1990's  . Appendectomy  1969  . Lumbar disc surgery      "slipped disc" (12/11/2012)  . Shoulder surgery Left ~ 2009    "put cement down in it"   . Vaginal hysterectomy  ~ 1973  . Ectopic pregnancy surgery  1969   Family History  Problem Relation Age of Onset  . Hypertension Mother   . Heart attack Father   . Diabetes Mellitus II Sister   . Hypertension Sister   . Cancer - Other Sister    Social History  Substance Use Topics  . Smoking status: Never Smoker   . Smokeless tobacco: Never Used     Comment: 04/07/2014"1 pack of cigarettes would last 3-4 months; haven't had any cigarettes for 40 years"  . Alcohol Use: No     Comment: used to drink beer about 15 yrs ago   OB History    No data available     Review of Systems  All other systems reviewed and are negative.     Allergies  Review of patient's allergies indicates no known allergies.  Home Medications   Prior to Admission medications   Medication  Sig Start Date End Date Taking? Authorizing Provider  albuterol (PROVENTIL HFA;VENTOLIN HFA) 108 (90 BASE) MCG/ACT inhaler Inhale 2 puffs into the lungs every 6 (six) hours as needed for wheezing or shortness of breath.    Historical Provider, MD  Alum & Mag Hydroxide-Simeth (MAGIC MOUTHWASH W/LIDOCAINE) SOLN Take 5 mLs by mouth 3 (three) times daily as needed for mouth pain. 03/14/15   Kaitlyn Szekalski, PA-C  aspirin EC 81 MG tablet Take 1 tablet (81 mg total) by mouth daily. 04/09/14   Debbe Odea, MD  atorvastatin (LIPITOR) 20 MG tablet Take 20 mg by mouth every morning.  01/11/15   Historical Provider, MD  ciprofloxacin (CIPRO) 500 MG tablet Take 1 tablet (500 mg total) by mouth 2 (two) times daily. Patient not taking: Reported on 03/10/2015 01/27/15   Ottie Glazier, PA-C  fluticasone [facility] [place]) 50 MCG/ACT nasal spray instill 2 sprays into each nostril once daily 01/05/15   Historical Provider, MD  folic acid (FOLVITE) 1 MG tablet Take 1 mg by mouth daily.    Historical Provider, MD  glimepiride (AMARYL) 2 MG tablet Take 2 mg by mouth daily with breakfast.  01/11/15   Historical Provider, MD  ibuprofen (ADVIL,MOTRIN) 800 MG tablet Take 1 tablet (800 mg total) by mouth 3 (three)  times daily. 03/14/15   Kaitlyn Szekalski, PA-C  insulin glargine (LANTUS) 100 unit/mL SOPN Inject 0.05 mLs (5 Units total) into the skin daily at 10 pm. 01/24/15   Silver Huguenin Elgergawy, MD  JANUVIA 100 MG tablet Take 100 mg by mouth daily.  01/11/15   Historical Provider, MD  lisinopril-hydrochlorothiazide (PRINZIDE,ZESTORETIC) 20-12.5 MG per tablet Take 1 tablet by mouth daily. 02/27/15   Historical Provider, MD  methotrexate (RHEUMATREX) 2.5 MG tablet Take 25 mg by mouth once a week. Take 10 tablets by mouth every Monday 01/12/15   Historical Provider, MD  metoprolol tartrate (LOPRESSOR) 25 MG tablet Take 1 tablet (25 mg total) by mouth 2 (two) times daily. 04/09/14   Debbe Odea, MD  SUMAtriptan (IMITREX) 50 MG tablet Take 1  tablet (50 mg total) by mouth every 2 (two) hours as needed for migraine. Patient not taking: Reported on 01/22/2015 12/15/12   Verlee Monte, MD  traMADol (ULTRAM) 50 MG tablet Take 50 mg by mouth 2 (two) times daily as needed for pain.  05/04/12   Barbara Cower, MD  zolpidem (AMBIEN) 5 MG tablet Take 5 mg by mouth at bedtime as needed for sleep.  12/27/14   Historical Provider, MD   BP 95/65 mmHg  Pulse 108  Temp(Src) 98.6 F (37 C)  Resp 18  SpO2 95% Physical Exam  Constitutional: She is oriented to person, place, and time. She appears well-developed and well-nourished.  HENT:  Head: Normocephalic and atraumatic.  Eyes: Conjunctivae and EOM are normal. Pupils are equal, round, and reactive to light.  Neck: Normal range of motion. Neck supple.  Cardiovascular: Normal rate and regular rhythm.   Pulmonary/Chest: Effort normal and breath sounds normal.  Abdominal: Soft. Bowel sounds are normal.  Musculoskeletal: Normal range of motion.  Neurological: She is alert and oriented to person, place, and time.  Skin: Skin is warm and dry.  Psychiatric: She has a normal mood and affect. Her behavior is normal.  Nursing note and vitals reviewed.   ED Course  Procedures (including critical care time) Labs Review Labs Reviewed  BASIC METABOLIC PANEL - Abnormal; Notable for the following:    Glucose, Bld 188 (*)    Creatinine, Ser 1.45 (*)    Calcium 10.7 (*)    GFR calc non Af Amer 34 (*)    GFR calc Af Amer 39 (*)    All other components within normal limits  CBC - Abnormal; Notable for the following:    Hemoglobin 11.9 (*)    RDW 16.3 (*)    Platelets 413 (*)    All other components within normal limits  I-STAT TROPOININ, ED    Imaging Review Dg Chest 2 View  03/23/2015   CLINICAL DATA:  Patient with left chest pain for multiple days. Worsening today. Dizziness.  EXAM: CHEST  2 VIEW  COMPARISON:  Chest radiograph 03/10/2015  FINDINGS: Improved aeration. Stable cardiac and  mediastinal contours. No consolidative pulmonary opacities. No pleural effusion or pneumothorax. Vertebroplasty material within lower thoracic spine. Cholecystectomy clips.  IMPRESSION: No active cardiopulmonary disease.   Electronically Signed   By: Lovey Newcomer M.D.   On: 03/23/2015 14:51   I have personally reviewed and evaluated these images and lab results as part of my medical decision-making.   EKG Interpretation   Date/Time:  Thursday March 23 2015 14:21:59 EDT Ventricular Rate:  120 PR Interval:  132 QRS Duration: 82 QT Interval:  328 QTC Calculation: 463 R Axis:   -21 Text Interpretation:  Sinus tachycardia Otherwise normal ECG Confirmed by  Genola Yuille  MD, Tamie Minteer ([id number]) on 03/23/2015 2:39:59 PM      MDM   Final diagnoses:  Chest pain, unspecified chest pain type    Patient has a known documented tachycardia. Otherwise screening tests are reasonable. She appears in no acute distress. Discussed findings with patient and her niece. She will seek primary care follow-up.    Nat Christen, MD 03/23/15 [phone]

## 2015-03-23 NOTE — ED Notes (Signed)
Pt here for chest pain that has been intermittent over a year and half. sts this episode started last week. sts dizzy and SOB.

## 2015-03-23 NOTE — Discharge Instructions (Signed)
Tests showed no evidence of a heart attack. Follow-up your primary care doctor.

## 2015-04-06 DIAGNOSIS — I10 Essential (primary) hypertension: Secondary | ICD-10-CM | POA: Diagnosis not present

## 2015-04-06 DIAGNOSIS — M069 Rheumatoid arthritis, unspecified: Secondary | ICD-10-CM | POA: Diagnosis not present

## 2015-04-06 DIAGNOSIS — K029 Dental caries, unspecified: Secondary | ICD-10-CM | POA: Diagnosis not present

## 2015-04-06 DIAGNOSIS — E119 Type 2 diabetes mellitus without complications: Secondary | ICD-10-CM | POA: Diagnosis not present

## 2015-04-10 DIAGNOSIS — R634 Abnormal weight loss: Secondary | ICD-10-CM | POA: Diagnosis not present

## 2015-04-10 DIAGNOSIS — M2550 Pain in unspecified joint: Secondary | ICD-10-CM | POA: Diagnosis not present

## 2015-04-10 DIAGNOSIS — M0589 Other rheumatoid arthritis with rheumatoid factor of multiple sites: Secondary | ICD-10-CM | POA: Diagnosis not present

## 2015-04-10 DIAGNOSIS — R938 Abnormal findings on diagnostic imaging of other specified body structures: Secondary | ICD-10-CM | POA: Diagnosis not present

## 2015-04-10 DIAGNOSIS — R0609 Other forms of dyspnea: Secondary | ICD-10-CM | POA: Diagnosis not present

## 2015-05-04 DIAGNOSIS — M069 Rheumatoid arthritis, unspecified: Secondary | ICD-10-CM | POA: Diagnosis not present

## 2015-05-04 DIAGNOSIS — E119 Type 2 diabetes mellitus without complications: Secondary | ICD-10-CM | POA: Diagnosis not present

## 2015-05-04 DIAGNOSIS — I10 Essential (primary) hypertension: Secondary | ICD-10-CM | POA: Diagnosis not present

## 2015-05-04 DIAGNOSIS — E876 Hypokalemia: Secondary | ICD-10-CM | POA: Diagnosis not present

## 2015-05-15 DIAGNOSIS — M0589 Other rheumatoid arthritis with rheumatoid factor of multiple sites: Secondary | ICD-10-CM | POA: Diagnosis not present

## 2015-05-15 DIAGNOSIS — R7989 Other specified abnormal findings of blood chemistry: Secondary | ICD-10-CM | POA: Diagnosis not present

## 2015-05-15 DIAGNOSIS — M2550 Pain in unspecified joint: Secondary | ICD-10-CM | POA: Diagnosis not present

## 2015-06-06 DIAGNOSIS — I10 Essential (primary) hypertension: Secondary | ICD-10-CM | POA: Diagnosis not present

## 2015-06-06 DIAGNOSIS — F430 Acute stress reaction: Secondary | ICD-10-CM | POA: Diagnosis not present

## 2015-06-06 DIAGNOSIS — E119 Type 2 diabetes mellitus without complications: Secondary | ICD-10-CM | POA: Diagnosis not present

## 2015-06-06 DIAGNOSIS — J449 Chronic obstructive pulmonary disease, unspecified: Secondary | ICD-10-CM | POA: Diagnosis not present

## 2015-06-08 DIAGNOSIS — J440 Chronic obstructive pulmonary disease with acute lower respiratory infection: Secondary | ICD-10-CM | POA: Diagnosis not present

## 2015-06-09 DIAGNOSIS — J440 Chronic obstructive pulmonary disease with acute lower respiratory infection: Secondary | ICD-10-CM | POA: Diagnosis not present

## 2015-06-13 DIAGNOSIS — E162 Hypoglycemia, unspecified: Secondary | ICD-10-CM | POA: Diagnosis not present

## 2015-06-13 DIAGNOSIS — E119 Type 2 diabetes mellitus without complications: Secondary | ICD-10-CM | POA: Diagnosis not present

## 2015-06-13 DIAGNOSIS — I10 Essential (primary) hypertension: Secondary | ICD-10-CM | POA: Diagnosis not present

## 2015-06-13 DIAGNOSIS — F430 Acute stress reaction: Secondary | ICD-10-CM | POA: Diagnosis not present

## 2015-06-14 ENCOUNTER — Emergency Department (HOSPITAL_COMMUNITY): Payer: Medicare Other

## 2015-06-14 ENCOUNTER — Inpatient Hospital Stay (HOSPITAL_COMMUNITY)
Admission: EM | Admit: 2015-06-14 | Discharge: 2015-06-17 | DRG: 690 | Disposition: A | Discharge status: Home / Self Care | Payer: Medicare Other | Attending: Internal Medicine | Admitting: Internal Medicine

## 2015-06-14 ENCOUNTER — Encounter (HOSPITAL_COMMUNITY): Payer: Self-pay | Admitting: Emergency Medicine

## 2015-06-14 DIAGNOSIS — R627 Adult failure to thrive: Secondary | ICD-10-CM | POA: Diagnosis not present

## 2015-06-14 DIAGNOSIS — R059 Cough, unspecified: Secondary | ICD-10-CM

## 2015-06-14 DIAGNOSIS — E7800 Pure hypercholesterolemia, unspecified: Secondary | ICD-10-CM | POA: Diagnosis present

## 2015-06-14 DIAGNOSIS — R634 Abnormal weight loss: Secondary | ICD-10-CM | POA: Diagnosis present

## 2015-06-14 DIAGNOSIS — R05 Cough: Secondary | ICD-10-CM

## 2015-06-14 DIAGNOSIS — I10 Essential (primary) hypertension: Secondary | ICD-10-CM | POA: Diagnosis not present

## 2015-06-14 DIAGNOSIS — M069 Rheumatoid arthritis, unspecified: Secondary | ICD-10-CM | POA: Diagnosis not present

## 2015-06-14 DIAGNOSIS — E11649 Type 2 diabetes mellitus with hypoglycemia without coma: Secondary | ICD-10-CM | POA: Diagnosis not present

## 2015-06-14 DIAGNOSIS — N390 Urinary tract infection, site not specified: Secondary | ICD-10-CM | POA: Diagnosis not present

## 2015-06-14 DIAGNOSIS — M25551 Pain in right hip: Secondary | ICD-10-CM | POA: Diagnosis not present

## 2015-06-14 DIAGNOSIS — I951 Orthostatic hypotension: Secondary | ICD-10-CM | POA: Diagnosis present

## 2015-06-14 DIAGNOSIS — E860 Dehydration: Secondary | ICD-10-CM | POA: Diagnosis not present

## 2015-06-14 DIAGNOSIS — R609 Edema, unspecified: Secondary | ICD-10-CM

## 2015-06-14 DIAGNOSIS — E119 Type 2 diabetes mellitus without complications: Secondary | ICD-10-CM

## 2015-06-14 DIAGNOSIS — R296 Repeated falls: Secondary | ICD-10-CM

## 2015-06-14 DIAGNOSIS — R0609 Other forms of dyspnea: Secondary | ICD-10-CM | POA: Diagnosis not present

## 2015-06-14 DIAGNOSIS — M4802 Spinal stenosis, cervical region: Secondary | ICD-10-CM | POA: Diagnosis not present

## 2015-06-14 DIAGNOSIS — R2243 Localized swelling, mass and lump, lower limb, bilateral: Secondary | ICD-10-CM

## 2015-06-14 DIAGNOSIS — R0600 Dyspnea, unspecified: Secondary | ICD-10-CM | POA: Diagnosis not present

## 2015-06-14 DIAGNOSIS — B9620 Unspecified Escherichia coli [E. coli] as the cause of diseases classified elsewhere: Secondary | ICD-10-CM | POA: Diagnosis present

## 2015-06-14 DIAGNOSIS — J42 Unspecified chronic bronchitis: Secondary | ICD-10-CM | POA: Diagnosis not present

## 2015-06-14 DIAGNOSIS — Z66 Do not resuscitate: Secondary | ICD-10-CM | POA: Diagnosis not present

## 2015-06-14 DIAGNOSIS — J9601 Acute respiratory failure with hypoxia: Secondary | ICD-10-CM | POA: Diagnosis not present

## 2015-06-14 DIAGNOSIS — Z79899 Other long term (current) drug therapy: Secondary | ICD-10-CM | POA: Diagnosis not present

## 2015-06-14 DIAGNOSIS — R42 Dizziness and giddiness: Secondary | ICD-10-CM | POA: Diagnosis not present

## 2015-06-14 DIAGNOSIS — W06XXXA Fall from bed, initial encounter: Secondary | ICD-10-CM | POA: Diagnosis present

## 2015-06-14 DIAGNOSIS — K219 Gastro-esophageal reflux disease without esophagitis: Secondary | ICD-10-CM | POA: Diagnosis not present

## 2015-06-14 DIAGNOSIS — M25561 Pain in right knee: Secondary | ICD-10-CM | POA: Diagnosis not present

## 2015-06-14 DIAGNOSIS — Z794 Long term (current) use of insulin: Secondary | ICD-10-CM | POA: Diagnosis not present

## 2015-06-14 DIAGNOSIS — Z833 Family history of diabetes mellitus: Secondary | ICD-10-CM

## 2015-06-14 DIAGNOSIS — R0602 Shortness of breath: Secondary | ICD-10-CM | POA: Diagnosis not present

## 2015-06-14 DIAGNOSIS — Z7982 Long term (current) use of aspirin: Secondary | ICD-10-CM

## 2015-06-14 DIAGNOSIS — J9600 Acute respiratory failure, unspecified whether with hypoxia or hypercapnia: Secondary | ICD-10-CM | POA: Diagnosis present

## 2015-06-14 HISTORY — DX: Obstructive sleep apnea (adult) (pediatric): G47.33

## 2015-06-14 HISTORY — DX: Repeated falls: R29.6

## 2015-06-14 HISTORY — DX: Anemia, unspecified: D64.9

## 2015-06-14 HISTORY — DX: Personal history of other medical treatment: Z92.89

## 2015-06-14 HISTORY — DX: Urinary tract infection, site not specified: N39.0

## 2015-06-14 LAB — URINE MICROSCOPIC-ADD ON: SQUAMOUS EPITHELIAL / LPF: NONE SEEN

## 2015-06-14 LAB — CBC
HEMATOCRIT: 35.5 % — AB (ref 36.0–46.0)
Hemoglobin: 11.4 g/dL — ABNORMAL LOW (ref 12.0–15.0)
MCH: 31.1 pg (ref 26.0–34.0)
MCHC: 32.1 g/dL (ref 30.0–36.0)
MCV: 97 fL (ref 78.0–100.0)
Platelets: 188 10*3/uL (ref 150–400)
RBC: 3.66 MIL/uL — AB (ref 3.87–5.11)
RDW: 14 % (ref 11.5–15.5)
WBC: 11.2 10*3/uL — AB (ref 4.0–10.5)

## 2015-06-14 LAB — CBC WITH DIFFERENTIAL/PLATELET
BASOS ABS: 0 10*3/uL (ref 0.0–0.1)
Basophils Relative: 0 %
EOS PCT: 0 %
Eosinophils Absolute: 0 10*3/uL (ref 0.0–0.7)
HCT: 39.1 % (ref 36.0–46.0)
HEMOGLOBIN: 12.7 g/dL (ref 12.0–15.0)
LYMPHS PCT: 21 %
Lymphs Abs: 2.4 10*3/uL (ref 0.7–4.0)
MCH: 31.2 pg (ref 26.0–34.0)
MCHC: 32.5 g/dL (ref 30.0–36.0)
MCV: 96.1 fL (ref 78.0–100.0)
Monocytes Absolute: 1.2 10*3/uL — ABNORMAL HIGH (ref 0.1–1.0)
Monocytes Relative: 11 %
NEUTROS ABS: 7.8 10*3/uL — AB (ref 1.7–7.7)
NEUTROS PCT: 68 %
PLATELETS: 222 10*3/uL (ref 150–400)
RBC: 4.07 MIL/uL (ref 3.87–5.11)
RDW: 13.8 % (ref 11.5–15.5)
WBC: 11.5 10*3/uL — AB (ref 4.0–10.5)

## 2015-06-14 LAB — COMPREHENSIVE METABOLIC PANEL
ALT: 32 U/L (ref 14–54)
ANION GAP: 8 (ref 5–15)
AST: 55 U/L — ABNORMAL HIGH (ref 15–41)
Albumin: 3.6 g/dL (ref 3.5–5.0)
Alkaline Phosphatase: 48 U/L (ref 38–126)
BILIRUBIN TOTAL: 1.1 mg/dL (ref 0.3–1.2)
BUN: 12 mg/dL (ref 6–20)
CO2: 35 mmol/L — ABNORMAL HIGH (ref 22–32)
Calcium: 9.8 mg/dL (ref 8.9–10.3)
Chloride: 97 mmol/L — ABNORMAL LOW (ref 101–111)
Creatinine, Ser: 1.22 mg/dL — ABNORMAL HIGH (ref 0.44–1.00)
GFR, EST AFRICAN AMERICAN: 48 mL/min — AB (ref >60)
GFR, EST NON AFRICAN AMERICAN: 42 mL/min — AB (ref >60)
Glucose, Bld: 181 mg/dL — ABNORMAL HIGH (ref 65–99)
POTASSIUM: 3.9 mmol/L (ref 3.5–5.1)
Sodium: 140 mmol/L (ref 135–145)
TOTAL PROTEIN: 7 g/dL (ref 6.5–8.1)

## 2015-06-14 LAB — CBG MONITORING, ED: Glucose-Capillary: 146 mg/dL — ABNORMAL HIGH (ref 65–99)

## 2015-06-14 LAB — CREATININE, SERUM
Creatinine, Ser: 0.98 mg/dL (ref 0.44–1.00)
GFR calc non Af Amer: 54 mL/min — ABNORMAL LOW (ref >60)

## 2015-06-14 LAB — URINALYSIS, ROUTINE W REFLEX MICROSCOPIC
BILIRUBIN URINE: NEGATIVE
GLUCOSE, UA: NEGATIVE mg/dL
HGB URINE DIPSTICK: NEGATIVE
KETONES UR: NEGATIVE mg/dL
NITRITE: POSITIVE — AB
PH: 7.5 (ref 5.0–8.0)
Protein, ur: NEGATIVE mg/dL
Specific Gravity, Urine: 1.017 (ref 1.005–1.030)

## 2015-06-14 LAB — TSH: TSH: 1.254 u[IU]/mL (ref 0.350–4.500)

## 2015-06-14 LAB — BRAIN NATRIURETIC PEPTIDE: B Natriuretic Peptide: 25.3 pg/mL (ref 0.0–100.0)

## 2015-06-14 LAB — GLUCOSE, CAPILLARY
GLUCOSE-CAPILLARY: 125 mg/dL — AB (ref 65–99)
GLUCOSE-CAPILLARY: 165 mg/dL — AB (ref 65–99)

## 2015-06-14 LAB — D-DIMER, QUANTITATIVE: D-Dimer, Quant: 5.87 ug/mL-FEU — ABNORMAL HIGH (ref 0.00–0.50)

## 2015-06-14 LAB — TROPONIN I: Troponin I: <0.03 ng/mL (ref <0.031)

## 2015-06-14 MED ORDER — DEXTROSE 5 % IV SOLN
1.0000 g | Freq: Once | INTRAVENOUS | Status: AC
  Administered 2015-06-14: 1 g via INTRAVENOUS
  Filled 2015-06-14: qty 10

## 2015-06-14 MED ORDER — METHYLPREDNISOLONE 4 MG PO TABS
4.0000 mg | ORAL_TABLET | Freq: Every morning | ORAL | Status: DC
  Administered 2015-06-15 – 2015-06-16 (×2): 4 mg via ORAL
  Filled 2015-06-14 (×2): qty 1

## 2015-06-14 MED ORDER — SUMATRIPTAN SUCCINATE 50 MG PO TABS
50.0000 mg | ORAL_TABLET | ORAL | Status: DC | PRN
  Filled 2015-06-14: qty 1

## 2015-06-14 MED ORDER — SODIUM CHLORIDE 0.9 % IV BOLUS (SEPSIS)
1000.0000 mL | Freq: Once | INTRAVENOUS | Status: AC
  Administered 2015-06-14: 1000 mL via INTRAVENOUS

## 2015-06-14 MED ORDER — FLUTICASONE PROPIONATE 50 MCG/ACT NA SUSP
1.0000 | Freq: Every day | NASAL | Status: DC
  Administered 2015-06-14 – 2015-06-17 (×4): 1 via NASAL
  Filled 2015-06-14: qty 16

## 2015-06-14 MED ORDER — ONDANSETRON HCL 4 MG PO TABS: 4.0000 mg | ORAL_TABLET | Freq: Four times a day (QID) | ORAL | Status: DC | PRN

## 2015-06-14 MED ORDER — FOLIC ACID 1 MG PO TABS
1.0000 mg | ORAL_TABLET | Freq: Every day | ORAL | Status: DC
  Administered 2015-06-14 – 2015-06-17 (×4): 1 mg via ORAL
  Filled 2015-06-14 (×4): qty 1

## 2015-06-14 MED ORDER — SODIUM CHLORIDE 0.9 % IJ SOLN
3.0000 mL | Freq: Two times a day (BID) | INTRAMUSCULAR | Status: DC
  Administered 2015-06-15: 3 mL via INTRAVENOUS

## 2015-06-14 MED ORDER — DEXTROSE 5 % IV SOLN
1.0000 g | INTRAVENOUS | Status: DC
  Administered 2015-06-15 – 2015-06-16 (×2): 1 g via INTRAVENOUS
  Filled 2015-06-14 (×3): qty 10

## 2015-06-14 MED ORDER — IOHEXOL 350 MG/ML SOLN
100.0000 mL | Freq: Once | INTRAVENOUS | Status: AC | PRN
  Administered 2015-06-14: 55 mL via INTRAVENOUS

## 2015-06-14 MED ORDER — IBUPROFEN 400 MG PO TABS
800.0000 mg | ORAL_TABLET | Freq: Three times a day (TID) | ORAL | Status: DC
  Administered 2015-06-14: 800 mg via ORAL
  Filled 2015-06-14: qty 2

## 2015-06-14 MED ORDER — ASPIRIN EC 81 MG PO TBEC
81.0000 mg | DELAYED_RELEASE_TABLET | Freq: Every day | ORAL | Status: DC
  Administered 2015-06-14 – 2015-06-17 (×4): 81 mg via ORAL
  Filled 2015-06-14 (×4): qty 1

## 2015-06-14 MED ORDER — MORPHINE SULFATE (PF) 2 MG/ML IV SOLN
2.0000 mg | Freq: Once | INTRAVENOUS | Status: AC
  Administered 2015-06-14: 2 mg via INTRAVENOUS
  Filled 2015-06-14: qty 1

## 2015-06-14 MED ORDER — METOPROLOL TARTRATE 12.5 MG HALF TABLET
12.5000 mg | ORAL_TABLET | Freq: Two times a day (BID) | ORAL | Status: DC
  Administered 2015-06-14 – 2015-06-17 (×6): 12.5 mg via ORAL
  Filled 2015-06-14 (×6): qty 1

## 2015-06-14 MED ORDER — ATORVASTATIN CALCIUM 20 MG PO TABS
20.0000 mg | ORAL_TABLET | Freq: Every morning | ORAL | Status: DC
  Administered 2015-06-15 – 2015-06-17 (×3): 20 mg via ORAL
  Filled 2015-06-14 (×3): qty 1

## 2015-06-14 MED ORDER — HYDROXYCHLOROQUINE SULFATE 200 MG PO TABS
200.0000 mg | ORAL_TABLET | Freq: Two times a day (BID) | ORAL | Status: DC
  Administered 2015-06-14 – 2015-06-17 (×6): 200 mg via ORAL
  Filled 2015-06-14 (×10): qty 1

## 2015-06-14 MED ORDER — TRAMADOL HCL 50 MG PO TABS
50.0000 mg | ORAL_TABLET | Freq: Two times a day (BID) | ORAL | Status: DC | PRN
  Administered 2015-06-15 – 2015-06-16 (×2): 50 mg via ORAL
  Filled 2015-06-14 (×2): qty 1

## 2015-06-14 MED ORDER — ENOXAPARIN SODIUM 40 MG/0.4ML ~~LOC~~ SOLN
40.0000 mg | SUBCUTANEOUS | Status: DC
  Administered 2015-06-14 – 2015-06-16 (×3): 40 mg via SUBCUTANEOUS
  Filled 2015-06-14 (×4): qty 0.4

## 2015-06-14 MED ORDER — POTASSIUM CHLORIDE IN NACL 20-0.9 MEQ/L-% IV SOLN
INTRAVENOUS | Status: DC
  Administered 2015-06-14: 18:00:00 via INTRAVENOUS
  Filled 2015-06-14 (×2): qty 1000

## 2015-06-14 MED ORDER — IPRATROPIUM-ALBUTEROL 0.5-2.5 (3) MG/3ML IN SOLN
3.0000 mL | RESPIRATORY_TRACT | Status: DC | PRN
  Filled 2015-06-14: qty 3

## 2015-06-14 MED ORDER — ONDANSETRON HCL 4 MG/2ML IJ SOLN: 4.0000 mg | Freq: Four times a day (QID) | INTRAMUSCULAR | Status: DC | PRN

## 2015-06-14 MED ORDER — INSULIN ASPART 100 UNIT/ML ~~LOC~~ SOLN
3.0000 [IU] | Freq: Three times a day (TID) | SUBCUTANEOUS | Status: DC
  Administered 2015-06-15 – 2015-06-17 (×2): 3 [IU] via SUBCUTANEOUS

## 2015-06-14 MED ORDER — ACETAMINOPHEN 650 MG RE SUPP: 650.0000 mg | Freq: Four times a day (QID) | RECTAL | Status: DC | PRN

## 2015-06-14 MED ORDER — INSULIN ASPART 100 UNIT/ML ~~LOC~~ SOLN
0.0000 [IU] | Freq: Three times a day (TID) | SUBCUTANEOUS | Status: DC
  Administered 2015-06-15 (×2): 1 [IU] via SUBCUTANEOUS
  Administered 2015-06-16: 2 [IU] via SUBCUTANEOUS
  Administered 2015-06-16: 5 [IU] via SUBCUTANEOUS

## 2015-06-14 MED ORDER — ALUM & MAG HYDROXIDE-SIMETH 200-200-20 MG/5ML PO SUSP: 30.0000 mL | Freq: Four times a day (QID) | ORAL | Status: DC | PRN

## 2015-06-14 MED ORDER — ENSURE ENLIVE PO LIQD
237.0000 mL | Freq: Two times a day (BID) | ORAL | Status: DC
  Administered 2015-06-15 – 2015-06-17 (×5): 237 mL via ORAL

## 2015-06-14 MED ORDER — ACETAMINOPHEN 325 MG PO TABS
650.0000 mg | ORAL_TABLET | Freq: Four times a day (QID) | ORAL | Status: DC | PRN
  Administered 2015-06-15 – 2015-06-16 (×2): 650 mg via ORAL
  Filled 2015-06-14 (×2): qty 2

## 2015-06-14 MED ORDER — TRAZODONE HCL 100 MG PO TABS
100.0000 mg | ORAL_TABLET | Freq: Every evening | ORAL | Status: DC | PRN
  Administered 2015-06-16: 100 mg via ORAL
  Filled 2015-06-14: qty 1

## 2015-06-14 MED ORDER — DOCUSATE SODIUM 100 MG PO CAPS
100.0000 mg | ORAL_CAPSULE | Freq: Two times a day (BID) | ORAL | Status: DC
  Administered 2015-06-14 – 2015-06-17 (×6): 100 mg via ORAL
  Filled 2015-06-14 (×6): qty 1

## 2015-06-14 NOTE — ED Notes (Signed)
Pt to be taken from xray to CT. Pt still in radiology.

## 2015-06-14 NOTE — ED Notes (Signed)
Admitting at bedside 

## 2015-06-14 NOTE — ED Notes (Signed)
Ambulated pt to restroom. Pt not able to take more than 2 steps pt placed in wheelchair. Dr.goldston made aware.

## 2015-06-14 NOTE — ED Provider Notes (Signed)
CSN: [id number]     Arrival date & time 06/14/15  1124 History   First MD Initiated Contact with Patient 06/14/15 1134     Chief Complaint  Patient presents with  . Fall     (Consider location/radiation/quality/duration/timing/severity/associated sxs/prior Treatment) HPI 79 year old female presents with multiple falls. Has fallen twice today. Fell last night and injured her right knee. She states she thinks she was having trouble today because she had a hard time walking on her right leg which hurts at her knee. She has been dizzy, especially with standing for the last 3-4 weeks. Multiple falls and EMS has had come out to her house multiple times. She does have shortness of breath, states it kind of feels like asthma, better with albuterol by EMS. She notes significantly decreased PO intake over past several weeks due to no appetite.  Past Medical History  Diagnosis Date  . Hypertension   . Chronic bronchitis ([place])     "get it just about q yr" (04/07/2014)  . Anginal pain ([place])   . Exertional shortness of breath   . High cholesterol   . Type II diabetes mellitus ([place])   . GERD (gastroesophageal reflux disease)   . Headache(784.0)     "once or twice/week" (04/07/2014)  . Arthritis     "all over my body"    Past Surgical History  Procedure Laterality Date  . Back surgery    . Cholecystectomy  1990's  . Appendectomy  1969  . Lumbar disc surgery      "slipped disc" (12/11/2012)  . Shoulder surgery Left ~ 2009    "put cement down in it"   . Vaginal hysterectomy  ~ 1973  . Ectopic pregnancy surgery  1969   Family History  Problem Relation Age of Onset  . Hypertension Mother   . Heart attack Father   . Diabetes Mellitus II Sister   . Hypertension Sister   . Cancer - Other Sister    Social History  Substance Use Topics  . Smoking status: Never Smoker   . Smokeless tobacco: Never Used     Comment: 04/07/2014"1 pack of cigarettes would last 3-4 months; haven't had any  cigarettes for 40 years"  . Alcohol Use: No     Comment: used to drink beer about 15 yrs ago   OB History    No data available     Review of Systems  Constitutional: Negative for fever.  Respiratory: Positive for shortness of breath.   Cardiovascular: Negative for chest pain and leg swelling.  Gastrointestinal: Negative for vomiting.  Musculoskeletal: Positive for arthralgias.  Neurological: Positive for dizziness and weakness.  All other systems reviewed and are negative.     Allergies  Review of patient's allergies indicates no known allergies.  Home Medications   Prior to Admission medications   Medication Sig Start Date End Date Taking? Authorizing Provider  albuterol (PROVENTIL HFA;VENTOLIN HFA) 108 (90 BASE) MCG/ACT inhaler Inhale 2 puffs into the lungs every 6 (six) hours as needed for wheezing or shortness of breath.   Yes Historical Provider, MD  Alum & Mag Hydroxide-Simeth (MAGIC MOUTHWASH W/LIDOCAINE) SOLN Take 5 mLs by mouth 3 (three) times daily as needed for mouth pain. 03/14/15  Yes Kaitlyn Szekalski, PA-C  aspirin EC 81 MG tablet Take 1 tablet (81 mg total) by mouth daily. 04/09/14  Yes Debbe Odea, MD  atorvastatin (LIPITOR) 20 MG tablet Take 20 mg by mouth every morning.  01/11/15  Yes Historical Provider, MD  fluticasone (FLONASE) 50 MCG/ACT nasal spray instill 2 sprays into each nostril once daily 01/05/15  Yes Historical Provider, MD  folic acid (FOLVITE) 1 MG tablet Take 1 mg by mouth daily.   Yes Historical Provider, MD  hydroxychloroquine (PLAQUENIL) 200 MG tablet Take 200 mg by mouth 2 (two) times daily. 05/15/15  Yes Historical Provider, MD  ibuprofen (ADVIL,MOTRIN) 800 MG tablet Take 1 tablet (800 mg total) by mouth 3 (three) times daily. 03/14/15  Yes Kaitlyn Szekalski, PA-C  insulin glargine (LANTUS) 100 unit/mL SOPN Inject 0.05 mLs (5 Units total) into the skin daily at 10 pm. 01/24/15  Yes Silver Huguenin Elgergawy, MD  JANUVIA 100 MG tablet Take 100 mg by mouth  daily.  01/11/15  Yes Historical Provider, MD  methylPREDNISolone (MEDROL) 4 MG tablet Take 2 mg by mouth every morning. 05/21/15  Yes Historical Provider, MD  metoprolol tartrate (LOPRESSOR) 25 MG tablet Take 1 tablet (25 mg total) by mouth 2 (two) times daily. 04/09/14  Yes Debbe Odea, MD  SUMAtriptan (IMITREX) 50 MG tablet Take 1 tablet (50 mg total) by mouth every 2 (two) hours as needed for migraine. 12/15/12  Yes Verlee Monte, MD  traMADol (ULTRAM) 50 MG tablet Take 50 mg by mouth 2 (two) times daily as needed for pain.  05/04/12  Yes Barbara Cower, MD  traZODone (DESYREL) 100 MG tablet Take 100 mg by mouth at bedtime as needed for sleep.  06/13/15   Historical Provider, MD   BP 119/65 mmHg  Pulse 101  Temp(Src) 98.2 F (36.8 C) (Oral)  Resp 24  Ht 5\' 5"  (1.651 m)  Wt 137 lb (62.143 kg)  BMI 22.80 kg/m2  SpO2 95% Physical Exam  Constitutional: She is oriented to person, place, and time. She appears well-developed and well-nourished.  HENT:  Head: Normocephalic and atraumatic.  Right Ear: External ear normal.  Left Ear: External ear normal.  Nose: Nose normal.  Eyes: EOM are normal. Pupils are equal, round, and reactive to light. Right eye exhibits no discharge. Left eye exhibits no discharge.  Neck: Neck supple.  Cardiovascular: Normal rate, regular rhythm and normal heart sounds.   Pulmonary/Chest: Effort normal and breath sounds normal.  Abdominal: Soft. She exhibits no distension. There is no tenderness.  Musculoskeletal:       Right hip: She exhibits normal range of motion and no tenderness.       Right knee: She exhibits swelling and laceration (superficial abrasion). Tenderness found.       Right upper leg: She exhibits no tenderness.       Right lower leg: She exhibits no tenderness.  Neurological: She is alert and oriented to person, place, and time.  Cn 2-12 grossly intact. 5/5 strength in RUE, LUE, LLE. RLE weaker but appears to be due to pain in right knee.   Skin: Skin is warm and dry.  Nursing note and vitals reviewed.   ED Course  Procedures (including critical care time) Labs Review Labs Reviewed  COMPREHENSIVE METABOLIC PANEL - Abnormal; Notable for the following:    Chloride 97 (*)    CO2 35 (*)    Glucose, Bld 181 (*)    Creatinine, Ser 1.22 (*)    AST 55 (*)    GFR calc non Af Amer 42 (*)    GFR calc Af Amer 48 (*)    All other components within normal limits  CBC WITH DIFFERENTIAL/PLATELET - Abnormal; Notable for the following:    WBC 11.5 (*)  Neutro Abs 7.8 (*)    Monocytes Absolute 1.2 (*)    All other components within normal limits  URINALYSIS, ROUTINE W REFLEX MICROSCOPIC (NOT AT [facility]) - Abnormal; Notable for the following:    APPearance CLOUDY (*)    Nitrite POSITIVE (*)    Leukocytes, UA SMALL (*)    All other components within normal limits  D-DIMER, QUANTITATIVE (NOT AT [facility]) - Abnormal; Notable for the following:    D-Dimer, Quant 5.87 (*)    All other components within normal limits  URINE MICROSCOPIC-ADD ON - Abnormal; Notable for the following:    Bacteria, UA MANY (*)    All other components within normal limits  CBG MONITORING, ED - Abnormal; Notable for the following:    Glucose-Capillary 146 (*)    All other components within normal limits  URINE CULTURE  TROPONIN I    Imaging Review Dg Chest 2 View  06/14/2015  CLINICAL DATA:  Fall, RIGHT hip pain and knee pain EXAM: CHEST  2 VIEW COMPARISON:  03/23/2015 FINDINGS: Normal cardiac silhouette. Mild LEFT basilar atelectasis. No effusion, infiltrate, pneumothorax. Vertebroplasty augmentation of the lower thoracic spine. IMPRESSION: LEFT basilar atelectasis.  No evidence of thoracic trauma. Electronically Signed   By: Suzy Bouchard M.D.   On: 06/14/2015 13:27   Ct Head Wo Contrast  06/14/2015  CLINICAL DATA:  Fall from bed with headaches and dizziness EXAM: CT HEAD WITHOUT CONTRAST TECHNIQUE: Contiguous axial images were obtained from the base of  the skull through the vertex without intravenous contrast. COMPARISON:  01/22/2015 FINDINGS: The bony calvarium is intact. No gross soft tissue abnormality is seen. Mild atrophic changes are noted. No acute hemorrhage, acute infarction or space-occupying mass lesion is identified. IMPRESSION: Atrophic changes without acute abnormality. Electronically Signed   By: Inez Catalina M.D.   On: 06/14/2015 13:42   Ct Angio Chest Pe W/cm &/or Wo Cm  06/14/2015  CLINICAL DATA:  Shortness of breath with chest pain and generalized weakness for 2 days. History of hypertension, diabetes and chronic bronchitis. Question pulmonary embolism. Initial encounter. EXAM: CT ANGIOGRAPHY CHEST WITH CONTRAST TECHNIQUE: Multidetector CT imaging of the chest was performed using the standard protocol during bolus administration of intravenous contrast. Multiplanar CT image reconstructions and MIPs were obtained to evaluate the vascular anatomy. CONTRAST:  101mL OMNIPAQUE IOHEXOL 350 MG/ML SOLN COMPARISON:  Radiographs 06/14/2015.  CT 01/22/2015. FINDINGS: Mediastinum: The pulmonary arteries are well opacified with contrast. There is no evidence of acute pulmonary embolism. There is stable mild atherosclerosis. The heart size is normal. There is no pericardial effusion. There are no enlarged mediastinal, hilar or axillary lymph nodes. The thyroid gland, trachea and esophagus demonstrate no significant findings. Lungs/Pleura: There is no pleural effusion.There is scattered subsegmental atelectasis at both lung bases, largely dependent and similar to the prior examination. The more focal density previously noted in the lingula has resolved. There are scattered calcified granulomas in both lungs. Upper abdomen: Hepatic steatosis and chronic extrahepatic biliary dilatation are noted status post cholecystectomy. There is a peripherally calcified exophytic lesion projecting anteriorly from the gastric fundus, measuring 2.2 cm on image 70. This has  not significantly changed from the most recent study. It appeared non calcified on prior studies dating back to 2007. Although previously questioned to reflect a diverticulum, this may reflect a chronic GIST tumor. Musculoskeletal/Chest wall: No chest wall lesion or acute osseous findings.Stable findings status post remote thoracolumbar spinal augmentation. Review of the MIP images confirms the above findings. IMPRESSION: 1.  No evidence of acute pulmonary embolism or other acute chest findings. 2. Patchy atelectasis in both lungs with interval resolution of more focal density previously noted in the lingula. No suspicious pulmonary nodules. 3. Mild atherosclerosis. 4. 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 Electronically Signed   By: Richardean Sale M.D.   On: 06/14/2015 15:53   Dg Knee Complete 4 Views Right  06/14/2015  CLINICAL DATA:  Initial encounter for [facility] with right hip and knee pain EXAM: RIGHT KNEE - COMPLETE 4+ VIEW COMPARISON:  06/16/2010 FINDINGS: No acute fracture or dislocation. Mild medial and patellofemoral compartment osteoarthritis with joint space narrowing. Enthesophyte at the quadriceps insertion. No joint effusion. IMPRESSION: Degenerative change, without acute osseous finding. Electronically Signed   By: Abigail Miyamoto M.D.   On: 06/14/2015 13:27   Dg Hip Unilat With Pelvis 2-3 Views Right  06/14/2015  CLINICAL DATA:  Fall, RIGHT hip and knee pain EXAM: DG HIP (WITH OR WITHOUT PELVIS) 2-3V RIGHT COMPARISON:  None FINDINGS: Diffuse osseous demineralization. Hip and SI joint spaces preserved. No acute fracture, dislocation or bone destruction. Degenerative disc disease changes at visualized lower lumbar spine. Scattered pelvic phleboliths. IMPRESSION: Osseous demineralization with degenerative disc disease changes at visualized lower lumbar spine. No acute osseous abnormalities. Electronically Signed   By:  Lavonia Dana M.D.   On: 06/14/2015 13:29   I have personally reviewed and evaluated these images and lab results as part of my medical decision-making.   EKG Interpretation   Date/Time:  Wednesday June 14 2015 11:36:14 EST Ventricular Rate:  98 PR Interval:  146 QRS Duration: 100 QT Interval:  376 QTC Calculation: 480 R Axis:   -35 Text Interpretation:  Sinus rhythm Probable left atrial enlargement  Incomplete RBBB and LAFB Consider RVH w/ secondary repol abnormality no  significant change since Aug 2016 Confirmed by Regenia Skeeter  MD, Carlea Badour ([id number])  on 06/14/2015 11:38:26 AM      MDM   Final diagnoses:  UTI (lower urinary tract infection)  Multiple falls    Patient with multiple falls at home and progressive weakness. Has dizziness that sounds like lightheadedness with her weakness there is some concern for cerebellar issues. Found to have a UTI which could be contributing to her weakness. She is unable to ambulate here and lives home alone. Feel it would be better for IV antibiotics, fluids, and admission to the hospital.    Sherwood Gambler, MD 06/14/15 [phone]

## 2015-06-14 NOTE — ED Notes (Addendum)
Pt came from home with complaint of multiple falls and decreased appetite that started 1 month ago. Pt states she has lost appropriately 10 lbs in the last month due to no appetite.pt fell yesterday and has laceration to right knee 2 inches no bleeding.pt fell again this morning. Pt c/o of pain in right mid back and flank area. Pt also reports her body is "sore" all over. Pt states she has just been so weak and having shortness of breath since her fall yesterday. Pt is alert and ox4.

## 2015-06-14 NOTE — H&P (Signed)
[facility] History and Physical                                                                                    [name], is a 79 y.o. female  MRN: [id number]   DOB - 1936/02/11  Admit Date - 06/14/2015  Outpatient Primary MD for the patient is [name], MD  Referring Physician:  [name]  Chief Complaint:   Chief Complaint  Patient presents with  . Fall     HPI  [name]  is a 79 y.o. female, with rheumatoid arthritis, diabetes, and hypertension who presents to the emergency department today after being found down. [name] reports that she has fallen 4 times in the past 24 hours. She complains of eating very little for the past 2 months and weight loss.  She denies abdominal pain and reports that her niece brings her food daily but she eats very little. She has had problems with dizziness and falling but they have, much worse in the last 24-48 hours. Today she was unable to get up and answer the door. She is currently unable to walk.  She tells me that she had a bladder infection and a yeast infection 1 month ago and was treated for these by her primary care physician. Today, she complains of having urgency and frequency with very little urine output.  In the emergency department the patient becomes dyspneic and her oxygen saturation drops to 76 while she is speaking to me. Her chest x-ray is negative for acute abnormality, her d-dimer was elevated but CTA chest was negative for PE. White count is slightly elevated. She is orthostatic with her systolic blood pressure dropping from 149 to 115.  The patient appears to have failure to thrive.  Review of Systems  Constitutional: Positive for weight loss.  HENT: Negative.   Eyes: Negative.   Respiratory: Positive for shortness of breath.   Cardiovascular: Negative.   Gastrointestinal: Negative.   Genitourinary: Positive for dysuria, urgency and frequency.  Musculoskeletal: Positive for falls.  Skin:  Negative.   Neurological: Positive for dizziness, tremors and weakness.  Endo/Heme/Allergies: Negative.   Psychiatric/Behavioral: Negative.      Past Medical History  Past Medical History  Diagnosis Date  . Hypertension   . Chronic bronchitis ([place])     "get it just about q yr" (04/07/2014)  . Anginal pain ([place])   . Exertional shortness of breath   . High cholesterol   . Type II diabetes mellitus ([place])   . GERD (gastroesophageal reflux disease)   . Headache(784.0)     "once or twice/week" (04/07/2014)  . Arthritis     "all over my body"     Past Surgical History  Procedure Laterality Date  . Back surgery    . Cholecystectomy  1990's  . Appendectomy  1969  . Lumbar disc surgery      "slipped disc" (12/11/2012)  . Shoulder surgery Left ~ 2009    "put cement down in it"   . Vaginal hysterectomy  ~ 1973  . Ectopic pregnancy surgery  1969      Social History Social History  Substance Use Topics  .  Smoking status: Never Smoker   . Smokeless tobacco: Never Used     Comment: 04/07/2014"1 pack of cigarettes would last 3-4 months; haven't had any cigarettes for 40 years"  . Alcohol Use: No     Comment: used to drink beer about 15 yrs ago   lives alone. Independent with ADLs. Her niece checks on her and brings her food.  Family History Family History  Problem Relation Age of Onset  . Hypertension Mother   . Heart attack Father   . Diabetes Mellitus II Sister   . Hypertension Sister   . Cancer - Other Sister    her mother was murdered. Her father died at age 9 with an MI.  Prior to Admission medications   Medication Sig Start Date End Date Taking? Authorizing Provider  albuterol (PROVENTIL HFA;VENTOLIN HFA) 108 (90 BASE) MCG/ACT inhaler Inhale 2 puffs into the lungs every 6 (six) hours as needed for wheezing or shortness of breath.   Yes Historical Provider, MD  Alum & Mag Hydroxide-Simeth (MAGIC MOUTHWASH W/LIDOCAINE) SOLN Take 5 mLs by mouth 3 (three) times daily as  needed for mouth pain. 03/14/15  Yes Kaitlyn Szekalski, PA-C  aspirin EC 81 MG tablet Take 1 tablet (81 mg total) by mouth daily. 04/09/14  Yes Debbe Odea, MD  atorvastatin (LIPITOR) 20 MG tablet Take 20 mg by mouth every morning.  01/11/15  Yes Historical Provider, MD  fluticasone (FLONASE) 50 MCG/ACT nasal spray instill 2 sprays into each nostril once daily 01/05/15  Yes Historical Provider, MD  folic acid (FOLVITE) 1 MG tablet Take 1 mg by mouth daily.   Yes Historical Provider, MD  hydroxychloroquine (PLAQUENIL) 200 MG tablet Take 200 mg by mouth 2 (two) times daily. 05/15/15  Yes Historical Provider, MD  ibuprofen (ADVIL,MOTRIN) 800 MG tablet Take 1 tablet (800 mg total) by mouth 3 (three) times daily. 03/14/15  Yes Kaitlyn Szekalski, PA-C  insulin glargine (LANTUS) 100 unit/mL SOPN Inject 0.05 mLs (5 Units total) into the skin daily at 10 pm. 01/24/15  Yes Silver Huguenin Elgergawy, MD  JANUVIA 100 MG tablet Take 100 mg by mouth daily.  01/11/15  Yes Historical Provider, MD  methylPREDNISolone (MEDROL) 4 MG tablet Take 2 mg by mouth every morning. 05/21/15  Yes Historical Provider, MD  metoprolol tartrate (LOPRESSOR) 25 MG tablet Take 1 tablet (25 mg total) by mouth 2 (two) times daily. 04/09/14  Yes Debbe Odea, MD  SUMAtriptan (IMITREX) 50 MG tablet Take 1 tablet (50 mg total) by mouth every 2 (two) hours as needed for migraine. 12/15/12  Yes Verlee Monte, MD  traMADol (ULTRAM) 50 MG tablet Take 50 mg by mouth 2 (two) times daily as needed for pain.  05/04/12  Yes Barbara Cower, MD  traZODone (DESYREL) 100 MG tablet Take 100 mg by mouth at bedtime as needed for sleep.  06/13/15   Historical Provider, MD    No Known Allergies  Physical Exam  Vitals  Blood pressure 139/56, pulse 97, temperature 98.2 F (36.8 C), temperature source Oral, resp. rate 18, height 5\' 5"  (1.651 m), weight 62.143 kg (137 lb), SpO2 93 %.   General: Thin, frail, elderly lying in bed in NAD, niece at bedside  Psych:   Normal affect and insight, Not Suicidal or Homicidal, Awake Alert, Oriented X 3.  Neuro:   Grossly normal. No acute focal neuro deficits.  ENT:  Ears and Eyes appear Normal, Conjunctivae clear, PER. Moist oral mucosa without erythema or exudates. Poor dentition  Neck:  Supple, No lymphadenopathy appreciated  Respiratory:  Symmetrical chest wall movement, Good air movement bilaterally, CTAB.  Cardiac:  RRR, No Murmurs, no LE edema noted, no JVD.    Abdomen:  Positive bowel sounds, Soft, Non tender, Non distended,  No masses appreciated, + right CVA tenderness  Skin:  No Cyanosis, Normal Skin Turgor, No Skin Rash or Bruise.  Extremities:  Able to move all 4. 5/5 strength in each,  no effusions.  Data Review  Wt Readings from Last 3 Encounters:  06/14/15 62.143 kg (137 lb)  03/10/15 66.679 kg (147 lb)  01/29/15 66.679 kg (147 lb)    CBC  Recent Labs Lab 06/14/15 1232  WBC 11.5*  HGB 12.7  HCT 39.1  PLT 222  MCV 96.1  MCH 31.2  MCHC 32.5  RDW 13.8  LYMPHSABS 2.4  MONOABS 1.2*  EOSABS 0.0  BASOSABS 0.0    Chemistries   Recent Labs Lab 06/14/15 1232  NA 140  K 3.9  CL 97*  CO2 35*  GLUCOSE 181*  BUN 12  CREATININE 1.22*  CALCIUM 9.8  AST 55*  ALT 32  ALKPHOS 48  BILITOT 1.1     Lab Results  Component Value Date   HGBA1C 8.4* 01/22/2015     Recent Labs  06/14/15 1232  DDIMER 5.87*    Cardiac Enzymes  Recent Labs Lab 06/14/15 1232  TROPONINI <0.03    Urinalysis    Component Value Date/Time   COLORURINE YELLOW 06/14/2015 1424   APPEARANCEUR CLOUDY* 06/14/2015 1424   LABSPEC 1.017 06/14/2015 1424   PHURINE 7.5 06/14/2015 1424   GLUCOSEU NEGATIVE 06/14/2015 1424   HGBUR NEGATIVE 06/14/2015 1424   BILIRUBINUR NEGATIVE 06/14/2015 1424   KETONESUR NEGATIVE 06/14/2015 1424   PROTEINUR NEGATIVE 06/14/2015 1424   UROBILINOGEN 1.0 03/10/2015 1459   NITRITE POSITIVE* 06/14/2015 1424   LEUKOCYTESUR SMALL* 06/14/2015 1424    Imaging  results:   Dg Chest 2 View  06/14/2015  CLINICAL DATA:  Fall, RIGHT hip pain and knee pain EXAM: CHEST  2 VIEW COMPARISON:  03/23/2015 FINDINGS: Normal cardiac silhouette. Mild LEFT basilar atelectasis. No effusion, infiltrate, pneumothorax. Vertebroplasty augmentation of the lower thoracic spine. IMPRESSION: LEFT basilar atelectasis.  No evidence of thoracic trauma. Electronically Signed   By: Suzy Bouchard M.D.   On: 06/14/2015 13:27   Ct Head Wo Contrast  06/14/2015  CLINICAL DATA:  Fall from bed with headaches and dizziness EXAM: CT HEAD WITHOUT CONTRAST TECHNIQUE: Contiguous axial images were obtained from the base of the skull through the vertex without intravenous contrast. COMPARISON:  01/22/2015 FINDINGS: The bony calvarium is intact. No gross soft tissue abnormality is seen. Mild atrophic changes are noted. No acute hemorrhage, acute infarction or space-occupying mass lesion is identified. IMPRESSION: Atrophic changes without acute abnormality. Electronically Signed   By: Inez Catalina M.D.   On: 06/14/2015 13:42   Ct Angio Chest Pe W/cm &/or Wo Cm  06/14/2015  CLINICAL DATA:  Shortness of breath with chest pain and generalized weakness for 2 days. History of hypertension, diabetes and chronic bronchitis. Question pulmonary embolism. Initial encounter. EXAM: CT ANGIOGRAPHY CHEST WITH CONTRAST TECHNIQUE: Multidetector CT imaging of the chest was performed using the standard protocol during bolus administration of intravenous contrast. Multiplanar CT image reconstructions and MIPs were obtained to evaluate the vascular anatomy. CONTRAST:  50mL OMNIPAQUE IOHEXOL 350 MG/ML SOLN COMPARISON:  Radiographs 06/14/2015.  CT 01/22/2015. FINDINGS: Mediastinum: The pulmonary arteries are well opacified with contrast. There is no evidence of acute  pulmonary embolism. There is stable mild atherosclerosis. The heart size is normal. There is no pericardial effusion. There are no enlarged mediastinal, hilar or  axillary lymph nodes. The thyroid gland, trachea and esophagus demonstrate no significant findings. Lungs/Pleura: There is no pleural effusion.There is scattered subsegmental atelectasis at both lung bases, largely dependent and similar to the prior examination. The more focal density previously noted in the lingula has resolved. There are scattered calcified granulomas in both lungs. Upper abdomen: Hepatic steatosis and chronic extrahepatic biliary dilatation are noted status post cholecystectomy. There is a peripherally calcified exophytic lesion projecting anteriorly from the gastric fundus, measuring 2.2 cm on image 70. This has not significantly changed from the most recent study. It appeared non calcified on prior studies dating back to 2007. Although previously questioned to reflect a diverticulum, this may reflect a chronic GIST tumor. Musculoskeletal/Chest wall: No chest wall lesion or acute osseous findings.Stable findings status post remote thoracolumbar spinal augmentation. Review of the MIP images confirms the above findings. IMPRESSION: 1. No evidence of acute pulmonary embolism or other acute chest findings. 2. Patchy atelectasis in both lungs with interval resolution of more focal density previously noted in the lingula. No suspicious pulmonary nodules. 3. Mild atherosclerosis. 4. 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 Electronically Signed   By: Richardean Sale M.D.   On: 06/14/2015 15:53   Dg Knee Complete 4 Views Right  06/14/2015  CLINICAL DATA:  Initial encounter for [facility] with right hip and knee pain EXAM: RIGHT KNEE - COMPLETE 4+ VIEW COMPARISON:  06/16/2010 FINDINGS: No acute fracture or dislocation. Mild medial and patellofemoral compartment osteoarthritis with joint space narrowing. Enthesophyte at the quadriceps insertion. No joint effusion. IMPRESSION: Degenerative change, without acute osseous finding.  Electronically Signed   By: Abigail Miyamoto M.D.   On: 06/14/2015 13:27   Dg Hip Unilat With Pelvis 2-3 Views Right  06/14/2015  CLINICAL DATA:  Fall, RIGHT hip and knee pain EXAM: DG HIP (WITH OR WITHOUT PELVIS) 2-3V RIGHT COMPARISON:  None FINDINGS: Diffuse osseous demineralization. Hip and SI joint spaces preserved. No acute fracture, dislocation or bone destruction. Degenerative disc disease changes at visualized lower lumbar spine. Scattered pelvic phleboliths. IMPRESSION: Osseous demineralization with degenerative disc disease changes at visualized lower lumbar spine. No acute osseous abnormalities. Electronically Signed   By: Lavonia Dana M.D.   On: 06/14/2015 13:29    My personal review of EKG: Sinus rhythm, incomplete right bundle branch block, QT within normal limits.   Assessment & Plan  Principal Problem:   Acute respiratory failure with hypoxia (HCC) Active Problems:   UTI (lower urinary tract infection)   Rheumatoid arthritis (HCC)   DM (diabetes mellitus) ([place])   Multiple falls   Weight loss   Orthostasis    Acute respiratory failure with hypoxia Oxygen saturation drops to mid 70s when patient is speaking. She has been short of breath for the past 24-48 hours She complains of dyspnea on exertion. Uncertain etiology. I do not hear adventitious lung sounds and her chest x-ray appears normal. D-dimer was elevated, but CTA chest was negative for PE. We'll place on oxygen PRN, albuterol nebs prn, flutter valve. Check an a.m. repeat chest x-ray after hydration, check BNP, check 2-D echo. Patient appears dehydrated rather than volume overloaded.  Urinary tract infection Patient reports she had a UTI 8 weeks ago. Presents today with an infected appearing UA. Will check culture. Rocephin started in the  ER.  Orthostasis with multiple falls Patients SBP dropped from 150 to 115 in the ED with change of position. Will decrease metoprolol by half, give gentle IV hydration. Patient has  had multiple MRIs in the past for dizziness and falling. PT evaluation. Check TSH. Question adrenal insufficiency?  Would recommend making BP medication decisions based on standing BPs rather than lying BPs.  Will apply TED hose.  Rheumatoid Arthritis Patient was switched from methotrexate to Plaquenil just 2 weeks ago. She takes steroids daily.  Will increase steroid dose to 4 mg daily from 2 mg daily while inpatient.  Diabetes mellitus. Patient has a cbg of 181 in the ER.  She reports having an episode of hypoglycemia last Thursday when her CBG was 21. We'll place on sliding scale insulin with mealtime coverage as an inpatient. We will hold her Lantus for now and follow her CBGs. We will likely need to titrate insulin.  Weight loss Documented weight loss shows she has decreased 10 lbs in 3 months.  The patient complains of having lost 35 lbs in the past year.  Will request a nutrition consultation and order ensure.    Consultants Called:    None  Family Communication:     Patient is alert, orientated and understands their plan of care.  Code Status:    DO NOT RESUSCITATE  Condition:    Guarded  Potential Disposition:   Likely to ALF or SNF when improved.  Time spent in minutes : [place],  [place] on 06/14/2015 at 4:52 PM Between 7am to 7pm - Pager - [phone] After 7pm go to [email or web address] - password [email or web address] And look for the night coverage person covering me after hours

## 2015-06-14 NOTE — Progress Notes (Signed)
Patient arrived on unit via stretcher from ED. Family at bedside. Telemetry placed per MD order and CMT notified.  

## 2015-06-15 ENCOUNTER — Inpatient Hospital Stay (HOSPITAL_COMMUNITY): Payer: Medicare Other

## 2015-06-15 DIAGNOSIS — J9601 Acute respiratory failure with hypoxia: Secondary | ICD-10-CM

## 2015-06-15 LAB — BASIC METABOLIC PANEL
ANION GAP: 6 (ref 5–15)
BUN: 6 mg/dL (ref 6–20)
CO2: 28 mmol/L (ref 22–32)
Calcium: 8.2 mg/dL — ABNORMAL LOW (ref 8.9–10.3)
Chloride: 105 mmol/L (ref 101–111)
Creatinine, Ser: 0.83 mg/dL (ref 0.44–1.00)
Glucose, Bld: 112 mg/dL — ABNORMAL HIGH (ref 65–99)
POTASSIUM: 3.3 mmol/L — AB (ref 3.5–5.1)
SODIUM: 139 mmol/L (ref 135–145)

## 2015-06-15 LAB — GLUCOSE, CAPILLARY
GLUCOSE-CAPILLARY: 100 mg/dL — AB (ref 65–99)
GLUCOSE-CAPILLARY: 138 mg/dL — AB (ref 65–99)
GLUCOSE-CAPILLARY: 156 mg/dL — AB (ref 65–99)
Glucose-Capillary: 134 mg/dL — ABNORMAL HIGH (ref 65–99)

## 2015-06-15 LAB — CBC
HEMATOCRIT: 33.6 % — AB (ref 36.0–46.0)
HEMOGLOBIN: 10.5 g/dL — AB (ref 12.0–15.0)
MCH: 30.3 pg (ref 26.0–34.0)
MCHC: 31.3 g/dL (ref 30.0–36.0)
MCV: 97.1 fL (ref 78.0–100.0)
Platelets: 174 10*3/uL (ref 150–400)
RBC: 3.46 MIL/uL — AB (ref 3.87–5.11)
RDW: 13.8 % (ref 11.5–15.5)
WBC: 7.9 10*3/uL (ref 4.0–10.5)

## 2015-06-15 LAB — CORTISOL: Cortisol, Plasma: 7.1 ug/dL

## 2015-06-15 LAB — HEMOGLOBIN A1C
Hgb A1c MFr Bld: 5.9 % — ABNORMAL HIGH (ref 4.8–5.6)
MEAN PLASMA GLUCOSE: 123 mg/dL

## 2015-06-15 MED ORDER — ZOLPIDEM TARTRATE 5 MG PO TABS
5.0000 mg | ORAL_TABLET | Freq: Once | ORAL | Status: AC
  Administered 2015-06-15: 5 mg via ORAL
  Filled 2015-06-15: qty 1

## 2015-06-15 MED ORDER — POTASSIUM CHLORIDE IN NACL 40-0.9 MEQ/L-% IV SOLN
INTRAVENOUS | Status: DC
  Administered 2015-06-15 – 2015-06-16 (×2): 75 mL/h via INTRAVENOUS
  Filled 2015-06-15 (×3): qty 1000

## 2015-06-15 MED ORDER — POTASSIUM CHLORIDE CRYS ER 20 MEQ PO TBCR
40.0000 meq | EXTENDED_RELEASE_TABLET | Freq: Once | ORAL | Status: AC
  Administered 2015-06-15: 40 meq via ORAL
  Filled 2015-06-15: qty 2

## 2015-06-15 NOTE — Progress Notes (Addendum)
[facility] PROGRESS NOTE  [name] [id number] DOB: [birth year] DOA: 06/14/2015 PCP: [name], MD  Length of stay: 1   Assessment/Plan: Principal Problem:   Acute respiratory failure with hypoxia ([place]) Active Problems:   UTI (lower urinary tract infection)   Rheumatoid arthritis ([place])   DM (diabetes mellitus) ([place])   Multiple falls   Weight loss   Orthostasis   Acute respiratory failure (HCC)    Acute respiratory failure with hypoxia Oxygen saturation drops to mid 70s when patient is speaking. Currently on 96% on room air at rest, CT scan negative for PE/pneumonia 2-D echo pending  D-dimer was elevated, but CTA chest was negative for PE. Will order venous Doppler to rule out DVT Continue PRN, albuterol nebs prn, flutter valve. Check an a.m. repeat chest x-ray after hydration, check BNP, check 2-D echo.   Urinary tract infection Patient reports she had a UTI 8 weeks ago. Presents today with an infected appearing UA. Will check culture. Rocephin started in the ER.  Orthostasis with multiple falls Patients SBP dropped from 150 to 115 in the ED with change of position. Will decrease metoprolol by half, continue IV hydration. Will order MRI of the C-spine to further evaluate patient's dizziness given history of rheumatoid arthritis, PT/OT evaluation. Normal TSH. Patient could also have adrenal insufficiency in the setting of hypokalemia? Check cortisol level Recommend support stockings and IV hydration today  Rheumatoid Arthritis Patient was switched from methotrexate to Plaquenil just 2 weeks ago. She takes steroids daily. Will increase steroid dose to 4 mg daily from 2 mg daily while inpatient.  Diabetes mellitus. Patient has a cbg of 181 in the ER. She reports having an episode of hypoglycemia last Thursday when her CBG was 21. Continuous as ssi   Weight loss Documented weight loss shows she has decreased 10 lbs in 3 months. The patient  complains of having lost 35 lbs in the past year. Will request a nutrition consultation and order ensure  DVT prophylaxsis Lovenox  Code Status:      Code Status Orders        Start     Ordered   06/14/15 [id number]  Do not attempt resuscitation (DNR)   Continuous    Question Answer Comment  In the event of cardiac or respiratory ARREST Do not call a "code blue"   In the event of cardiac or respiratory ARREST Do not perform Intubation, CPR, defibrillation or ACLS   In the event of cardiac or respiratory ARREST Use medication by any route, position, wound care, and other measures to relive pain and suffering. May use oxygen, suction and manual treatment of airway obstruction as needed for comfort.      06/14/15 1747     Family Communication: family updated about patient's clinical progress Disposition Plan:  As above    Brief narrative: 79 y.o. female with a Past Medical History of RA, DM, HTN, history of chronic dizziness / recurrent pre-syncopal episodes who presents with recurrent falls in the past day, similar to her prior episodes. She has had multiple MRIs including one 4 months ago which were negative. She is orthostatic in the ED and has a UTI which may contribute. Will check TSH and increase her steroids as may have a component of adrenal insufficiency. She does not appear very dyspneic and I am not sure why she is that hypoxic, will check an echo.  Consultants:  None  Procedures:  None  Antibiotics: Anti-infectives  Start     Dose/Rate Route Frequency Ordered Stop   06/15/15 1600  cefTRIAXone (ROCEPHIN) 1 g in dextrose 5 % 50 mL IVPB     1 g 100 mL/hr over 30 Minutes Intravenous Every 24 hours 06/14/15 1747     06/14/15 2200  hydroxychloroquine (PLAQUENIL) tablet 200 mg     200 mg Oral 2 times daily 06/14/15 1747     06/14/15 1530  cefTRIAXone (ROCEPHIN) 1 g in dextrose 5 % 50 mL IVPB     1 g 100 mL/hr over 30 Minutes Intravenous  Once 06/14/15 1527 06/14/15 1617          HPI/Subjective: Patient barely orthostatic upon presentation, presented with recurrent falls, complaining of neck pain and dizziness  Objective: Filed Vitals:   06/14/15 2046 06/15/15 0439 06/15/15 0457 06/15/15 0943  BP: 112/73  118/54 115/51  Pulse: 111  72 70  Temp: 98.6 F (37 C)  97.8 F (36.6 C) 98 F (36.7 C)  TempSrc: Oral  Oral Oral  Resp: 20  19 18   Height:      Weight: 62.2 kg (137 lb 2 oz) 62.2 kg (137 lb 2 oz)    SpO2: 95%  94% 96%    Intake/Output Summary (Last 24 hours) at 06/15/15 1042 Last data filed at 06/15/15 0900  Gross per 24 hour  Intake   1240 ml  Output    150 ml  Net   1090 ml    Exam:   General: Thin, frail, elderly lying in bed in NAD, niece at bedside  Psych: Normal affect and insight, Not Suicidal or Homicidal, Awake Alert, Oriented X 3.  Neuro: Grossly normal. No acute focal neuro deficits.  ENT: Ears and Eyes appear Normal, Conjunctivae clear, PER. Moist oral mucosa without erythema or exudates. Poor dentition  Neck: Supple, No lymphadenopathy appreciated  Respiratory: Symmetrical chest wall movement, Good air movement bilaterally, CTAB.  Cardiac: RRR, No Murmurs, no LE edema noted, no JVD.   Abdomen: Positive bowel sounds, Soft, Non tender, Non distended, No masses appreciated, + right CVA tenderness  Skin: No Cyanosis, Normal Skin Turgor, No Skin Rash or Bruise.  Extremities: Able to move all 4. 5/5 strength in each, no effusions. Data Review   Micro Results No results found for this or any previous visit (from the past 240 hour(s)).  Radiology Reports Dg Chest 2 View  06/15/2015  CLINICAL DATA:  Dyspnea on exertion EXAM: CHEST  2 VIEW COMPARISON:  06/14/2015 FINDINGS: Elevated right hemidiaphragm with right lower lobe atelectasis, slightly increased in the interval. Heart size mildly enlarged. Negative for heart failure. Left lung clear. Cement vertebroplasty at approximately T12 and L1 IMPRESSION:  Slight increase in right lower lobe atelectasis. No other new findings. Electronically Signed   By: Franchot Gallo M.D.   On: 06/15/2015 07:51   Dg Chest 2 View  06/14/2015  CLINICAL DATA:  Fall, RIGHT hip pain and knee pain EXAM: CHEST  2 VIEW COMPARISON:  03/23/2015 FINDINGS: Normal cardiac silhouette. Mild LEFT basilar atelectasis. No effusion, infiltrate, pneumothorax. Vertebroplasty augmentation of the lower thoracic spine. IMPRESSION: LEFT basilar atelectasis.  No evidence of thoracic trauma. Electronically Signed   By: Suzy Bouchard M.D.   On: 06/14/2015 13:27   Ct Head Wo Contrast  06/14/2015  CLINICAL DATA:  Fall from bed with headaches and dizziness EXAM: CT HEAD WITHOUT CONTRAST TECHNIQUE: Contiguous axial images were obtained from the base of the skull through the vertex without intravenous contrast. COMPARISON:  01/22/2015  FINDINGS: The bony calvarium is intact. No gross soft tissue abnormality is seen. Mild atrophic changes are noted. No acute hemorrhage, acute infarction or space-occupying mass lesion is identified. IMPRESSION: Atrophic changes without acute abnormality. Electronically Signed   By: Inez Catalina M.D.   On: 06/14/2015 13:42   Ct Angio Chest Pe W/cm &/or Wo Cm  06/14/2015  CLINICAL DATA:  Shortness of breath with chest pain and generalized weakness for 2 days. History of hypertension, diabetes and chronic bronchitis. Question pulmonary embolism. Initial encounter. EXAM: CT ANGIOGRAPHY CHEST WITH CONTRAST TECHNIQUE: Multidetector CT imaging of the chest was performed using the standard protocol during bolus administration of intravenous contrast. Multiplanar CT image reconstructions and MIPs were obtained to evaluate the vascular anatomy. CONTRAST:  67mL OMNIPAQUE IOHEXOL 350 MG/ML SOLN COMPARISON:  Radiographs 06/14/2015.  CT 01/22/2015. FINDINGS: Mediastinum: The pulmonary arteries are well opacified with contrast. There is no evidence of acute pulmonary embolism. There  is stable mild atherosclerosis. The heart size is normal. There is no pericardial effusion. There are no enlarged mediastinal, hilar or axillary lymph nodes. The thyroid gland, trachea and esophagus demonstrate no significant findings. Lungs/Pleura: There is no pleural effusion.There is scattered subsegmental atelectasis at both lung bases, largely dependent and similar to the prior examination. The more focal density previously noted in the lingula has resolved. There are scattered calcified granulomas in both lungs. Upper abdomen: Hepatic steatosis and chronic extrahepatic biliary dilatation are noted status post cholecystectomy. There is a peripherally calcified exophytic lesion projecting anteriorly from the gastric fundus, measuring 2.2 cm on image 70. This has not significantly changed from the most recent study. It appeared non calcified on prior studies dating back to 2007. Although previously questioned to reflect a diverticulum, this may reflect a chronic GIST tumor. Musculoskeletal/Chest wall: No chest wall lesion or acute osseous findings.Stable findings status post remote thoracolumbar spinal augmentation. Review of the MIP images confirms the above findings. IMPRESSION: 1. No evidence of acute pulmonary embolism or other acute chest findings. 2. Patchy atelectasis in both lungs with interval resolution of more focal density previously noted in the lingula. No suspicious pulmonary nodules. 3. Mild atherosclerosis. 4. 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 Electronically Signed   By: Richardean Sale M.D.   On: 06/14/2015 15:53   Dg Knee Complete 4 Views Right  06/14/2015  CLINICAL DATA:  Initial encounter for [facility] with right hip and knee pain EXAM: RIGHT KNEE - COMPLETE 4+ VIEW COMPARISON:  06/16/2010 FINDINGS: No acute fracture or dislocation. Mild medial and patellofemoral compartment osteoarthritis with joint space  narrowing. Enthesophyte at the quadriceps insertion. No joint effusion. IMPRESSION: Degenerative change, without acute osseous finding. Electronically Signed   By: Abigail Miyamoto M.D.   On: 06/14/2015 13:27   Dg Hip Unilat With Pelvis 2-3 Views Right  06/14/2015  CLINICAL DATA:  Fall, RIGHT hip and knee pain EXAM: DG HIP (WITH OR WITHOUT PELVIS) 2-3V RIGHT COMPARISON:  None FINDINGS: Diffuse osseous demineralization. Hip and SI joint spaces preserved. No acute fracture, dislocation or bone destruction. Degenerative disc disease changes at visualized lower lumbar spine. Scattered pelvic phleboliths. IMPRESSION: Osseous demineralization with degenerative disc disease changes at visualized lower lumbar spine. No acute osseous abnormalities. Electronically Signed   By: Lavonia Dana M.D.   On: 06/14/2015 13:29     CBC  Recent Labs Lab 06/14/15 1232 06/14/15 1924 06/15/15 0600  WBC 11.5* 11.2* 7.9  HGB 12.7 11.4* 10.5*  HCT 39.1 35.5* 33.6*  PLT 222 188 174  MCV 96.1 97.0 97.1  MCH 31.2 31.1 30.3  MCHC 32.5 32.1 31.3  RDW 13.8 14.0 13.8  LYMPHSABS 2.4  --   --   MONOABS 1.2*  --   --   EOSABS 0.0  --   --   BASOSABS 0.0  --   --     Chemistries   Recent Labs Lab 06/14/15 1232 06/14/15 1924 06/15/15 0600  NA 140  --  139  K 3.9  --  3.3*  CL 97*  --  105  CO2 35*  --  28  GLUCOSE 181*  --  112*  BUN 12  --  6  CREATININE 1.22* 0.98 0.83  CALCIUM 9.8  --  8.2*  AST 55*  --   --   ALT 32  --   --   ALKPHOS 48  --   --   BILITOT 1.1  --   --    ------------------------------------------------------------------------------------------------------------------ estimated creatinine clearance is 51.1 mL/min (by C-G formula based on Cr of 0.83). ------------------------------------------------------------------------------------------------------------------  Recent Labs  06/14/15 1924  HGBA1C 5.9*    ------------------------------------------------------------------------------------------------------------------ No results for input(s): CHOL, HDL, LDLCALC, TRIG, CHOLHDL, LDLDIRECT in the last 72 hours. ------------------------------------------------------------------------------------------------------------------  Recent Labs  06/14/15 1924  TSH 1.254   ------------------------------------------------------------------------------------------------------------------ No results for input(s): VITAMINB12, FOLATE, FERRITIN, TIBC, IRON, RETICCTPCT in the last 72 hours.  Coagulation profile No results for input(s): INR, PROTIME in the last 168 hours.   Recent Labs  06/14/15 1232  DDIMER 5.87*    Cardiac Enzymes  Recent Labs Lab 06/14/15 1232  TROPONINI <0.03   ------------------------------------------------------------------------------------------------------------------ Invalid input(s): POCBNP   CBG:  Recent Labs Lab 06/14/15 1238 06/14/15 1755 06/14/15 2046 06/15/15 0757  GLUCAP 146* 125* 165* 134*       Studies: Dg Chest 2 View  06/15/2015  CLINICAL DATA:  Dyspnea on exertion EXAM: CHEST  2 VIEW COMPARISON:  06/14/2015 FINDINGS: Elevated right hemidiaphragm with right lower lobe atelectasis, slightly increased in the interval. Heart size mildly enlarged. Negative for heart failure. Left lung clear. Cement vertebroplasty at approximately T12 and L1 IMPRESSION: Slight increase in right lower lobe atelectasis. No other new findings. Electronically Signed   By: Franchot Gallo M.D.   On: 06/15/2015 07:51   Dg Chest 2 View  06/14/2015  CLINICAL DATA:  Fall, RIGHT hip pain and knee pain EXAM: CHEST  2 VIEW COMPARISON:  03/23/2015 FINDINGS: Normal cardiac silhouette. Mild LEFT basilar atelectasis. No effusion, infiltrate, pneumothorax. Vertebroplasty augmentation of the lower thoracic spine. IMPRESSION: LEFT basilar atelectasis.  No evidence of thoracic  trauma. Electronically Signed   By: Suzy Bouchard M.D.   On: 06/14/2015 13:27   Ct Head Wo Contrast  06/14/2015  CLINICAL DATA:  Fall from bed with headaches and dizziness EXAM: CT HEAD WITHOUT CONTRAST TECHNIQUE: Contiguous axial images were obtained from the base of the skull through the vertex without intravenous contrast. COMPARISON:  01/22/2015 FINDINGS: The bony calvarium is intact. No gross soft tissue abnormality is seen. Mild atrophic changes are noted. No acute hemorrhage, acute infarction or space-occupying mass lesion is identified. IMPRESSION: Atrophic changes without acute abnormality. Electronically Signed   By: Inez Catalina M.D.   On: 06/14/2015 13:42   Ct Angio Chest Pe W/cm &/or Wo Cm  06/14/2015  CLINICAL DATA:  Shortness of breath with chest pain and generalized weakness for 2 days. History of hypertension, diabetes and chronic bronchitis. Question pulmonary embolism. Initial encounter.  EXAM: CT ANGIOGRAPHY CHEST WITH CONTRAST TECHNIQUE: Multidetector CT imaging of the chest was performed using the standard protocol during bolus administration of intravenous contrast. Multiplanar CT image reconstructions and MIPs were obtained to evaluate the vascular anatomy. CONTRAST:  54mL OMNIPAQUE IOHEXOL 350 MG/ML SOLN COMPARISON:  Radiographs 06/14/2015.  CT 01/22/2015. FINDINGS: Mediastinum: The pulmonary arteries are well opacified with contrast. There is no evidence of acute pulmonary embolism. There is stable mild atherosclerosis. The heart size is normal. There is no pericardial effusion. There are no enlarged mediastinal, hilar or axillary lymph nodes. The thyroid gland, trachea and esophagus demonstrate no significant findings. Lungs/Pleura: There is no pleural effusion.There is scattered subsegmental atelectasis at both lung bases, largely dependent and similar to the prior examination. The more focal density previously noted in the lingula has resolved. There are scattered calcified  granulomas in both lungs. Upper abdomen: Hepatic steatosis and chronic extrahepatic biliary dilatation are noted status post cholecystectomy. There is a peripherally calcified exophytic lesion projecting anteriorly from the gastric fundus, measuring 2.2 cm on image 70. This has not significantly changed from the most recent study. It appeared non calcified on prior studies dating back to 2007. Although previously questioned to reflect a diverticulum, this may reflect a chronic GIST tumor. Musculoskeletal/Chest wall: No chest wall lesion or acute osseous findings.Stable findings status post remote thoracolumbar spinal augmentation. Review of the MIP images confirms the above findings. IMPRESSION: 1. No evidence of acute pulmonary embolism or other acute chest findings. 2. Patchy atelectasis in both lungs with interval resolution of more focal density previously noted in the lingula. No suspicious pulmonary nodules. 3. Mild atherosclerosis. 4. 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 Electronically Signed   By: Richardean Sale M.D.   On: 06/14/2015 15:53   Dg Knee Complete 4 Views Right  06/14/2015  CLINICAL DATA:  Initial encounter for [facility] with right hip and knee pain EXAM: RIGHT KNEE - COMPLETE 4+ VIEW COMPARISON:  06/16/2010 FINDINGS: No acute fracture or dislocation. Mild medial and patellofemoral compartment osteoarthritis with joint space narrowing. Enthesophyte at the quadriceps insertion. No joint effusion. IMPRESSION: Degenerative change, without acute osseous finding. Electronically Signed   By: Abigail Miyamoto M.D.   On: 06/14/2015 13:27   Dg Hip Unilat With Pelvis 2-3 Views Right  06/14/2015  CLINICAL DATA:  Fall, RIGHT hip and knee pain EXAM: DG HIP (WITH OR WITHOUT PELVIS) 2-3V RIGHT COMPARISON:  None FINDINGS: Diffuse osseous demineralization. Hip and SI joint spaces preserved. No acute fracture, dislocation or bone  destruction. Degenerative disc disease changes at visualized lower lumbar spine. Scattered pelvic phleboliths. IMPRESSION: Osseous demineralization with degenerative disc disease changes at visualized lower lumbar spine. No acute osseous abnormalities. Electronically Signed   By: Lavonia Dana M.D.   On: 06/14/2015 13:29      Lab Results  Component Value Date   HGBA1C 5.9* 06/14/2015   HGBA1C 8.4* 01/22/2015   HGBA1C 8.5* 04/07/2014   Lab Results  Component Value Date   MICROALBUR 4.02* 11/07/2008   LDLCALC 86 07/02/2010   CREATININE 0.83 06/15/2015       Scheduled Meds: . aspirin EC  81 mg Oral Daily  . atorvastatin  20 mg Oral q morning - 10a  . cefTRIAXone (ROCEPHIN)  IV  1 g Intravenous Q24H  . docusate sodium  100 mg Oral BID  . enoxaparin (LOVENOX) injection  40 mg Subcutaneous Q24H  . feeding supplement (ENSURE ENLIVE)  237  mL Oral BID BM  . fluticasone  1 spray Each Nare Daily  . folic acid  1 mg Oral Daily  . hydroxychloroquine  200 mg Oral BID  . insulin aspart  0-9 Units Subcutaneous TID WC  . insulin aspart  3 Units Subcutaneous TID WC  . methylPREDNISolone  4 mg Oral q morning - 10a  . metoprolol tartrate  12.5 mg Oral BID  . potassium chloride  40 mEq Oral Once  . sodium chloride  3 mL Intravenous Q12H   Continuous Infusions:   Principal Problem:   Acute respiratory failure with hypoxia (HCC) Active Problems:   UTI (lower urinary tract infection)   Rheumatoid arthritis (HCC)   DM (diabetes mellitus) ([place])   Multiple falls   Weight loss   Orthostasis   Acute respiratory failure ([place])    Time spent: 45 minutes   [facility] Pager [phone]. If 7PM-7AM, please contact night-coverage at [email or web address], password [facility] 06/15/2015, 10:42 AM  LOS: 1 day

## 2015-06-15 NOTE — Evaluation (Signed)
Occupational Therapy Evaluation Patient Details Name: [name] MRN: [id number] DOB: 20-Oct-1935 Today's Date: 06/15/2015    History of Present Illness Pt is a 79 yo female admitted after several falls in the same day one resulting in R knee pain.  Pt has had progressive weakness and increased falls over the last few months with several visits from EMS.  Pt was orthostatic in ER.  pt with presyncopal episodes, dizziness, RA, DM, HTN.  MRIs have been -.  Pt found to have UTI.   Clinical Impression   Pt admitted with the above diagnosis and has the deficits listed below. Pt would benefit from cont OT to increase safety and independence with all adls so she can d/c home alone safely.  Feel with pts slow decline over the last few months and increased falls/decreased independence with mobility and adls and living alone, pt may benefit from short term rehab before returning home alone.    Follow Up Recommendations  SNF;Other (comment) (unless they can find 24 hour S for a few weeks.)    Equipment Recommendations  3 in 1 bedside comode;Tub/shower seat    Recommendations for Other Services       Precautions / Restrictions Precautions Precautions: Fall Precaution Comments: pt lives alone and falls regularly.  Restrictions Weight Bearing Restrictions: No      Mobility Bed Mobility Overal bed mobility: Needs Assistance Bed Mobility: Supine to Sit     Supine to sit: Supervision;HOB elevated     General bed mobility comments: Pt moved well in the bed.  Need to do mobilty with bed flat.  Transfers Overall transfer level: Needs assistance Equipment used: 1 person hand held assist Transfers: Sit to/from [facility] Sit to Stand: Min assist Stand pivot transfers: Min assist       General transfer comment: Pt doesn't tolerate being on feet for long before requesting to sit.    Balance Overall balance assessment: Needs assistance;History of  Falls Sitting-balance support: No upper extremity supported;Feet supported Sitting balance-Leahy Scale: Good     Standing balance support: Bilateral upper extremity supported;During functional activity Standing balance-Leahy Scale: Poor Standing balance comment: pt needed outside assist to remain standing safely.                            ADL Overall ADL's : Needs assistance/impaired Eating/Feeding: Independent;Sitting   Grooming: Wash/dry hands;Wash/dry face;Oral care;Set up;Sitting Grooming Details (indicate cue type and reason): pt declined standing at sink to groom Upper Body Bathing: Set up;Sitting   Lower Body Bathing: Minimal assistance;Sit to/from stand Lower Body Bathing Details (indicate cue type and reason): Pt does well crossing legs up to wash feet.  Pt unsteady when standing to wash bottom. Upper Body Dressing : Set up;Sitting   Lower Body Dressing: Minimal assistance;Sit to/from stand Lower Body Dressing Details (indicate cue type and reason): min assist needed when pt is on her feet to pull pants up and fasten them.  Pt needs no assist donning socks and shoes. Toilet Transfer: Minimal Production assistant, radio Details (indicate cue type and reason): min assist for balance. Toileting- Clothing Manipulation and Hygiene: Minimal assistance;Sit to/from stand Toileting - Clothing Manipulation Details (indicate cue type and reason): min assist to maintain standing.  pt very weak.     Functional mobility during ADLs: Minimal assistance;Rolling walker General ADL Comments: Pt does well with adls. Pt just appears very weak and does not tolerate much activity at one time and needs  a lot of rest breaks.       Vision Vision Assessment?: No apparent visual deficits   Perception     Praxis      Pertinent Vitals/Pain Pain Assessment: 0-10 Pain Score: 4  Pain Location: R knee Pain Descriptors / Indicators: Aching Pain Intervention(s): Limited  activity within patient's tolerance;Monitored during session;Repositioned     Hand Dominance Right   Extremity/Trunk Assessment Upper Extremity Assessment Upper Extremity Assessment: Generalized weakness   Lower Extremity Assessment Lower Extremity Assessment: Defer to PT evaluation   Cervical / Trunk Assessment Cervical / Trunk Assessment: Normal   Communication Communication Communication: No difficulties   Cognition Arousal/Alertness: Awake/alert Behavior During Therapy: WFL for tasks assessed/performed Overall Cognitive Status: Within Functional Limits for tasks assessed                     General Comments       Exercises       Shoulder Instructions      Home Living Family/patient expects to be discharged to:: Private residence Living Arrangements: Alone Available Help at Discharge: Family;Available PRN/intermittently Type of Home: House Home Access: Stairs to enter [employer] of Steps: 3 Entrance Stairs-Rails: Can reach both;Right;Left Home Layout: One level     Bathroom Shower/Tub: Tub/shower unit;Curtain Shower/tub characteristics: Architectural technologist: Standard     Home Equipment: Environmental consultant - 2 wheels   Additional Comments: Pt would benefit from shower chair and 3:1 if d/cs home.      Prior Functioning/Environment Level of Independence: Independent with assistive device(s)        Comments: Pt states she uses walker more at home lately but is still falling.  Pt has had multiple EMS visits.  Question if pt is safe living at home alone.    OT Diagnosis: Generalized weakness;Acute pain   OT Problem List: Decreased strength;Decreased activity tolerance;Impaired balance (sitting and/or standing);Decreased knowledge of use of DME or AE;Pain   OT Treatment/Interventions: Self-care/ADL training;Therapeutic activities;DME and/or AE instruction    OT Goals(Current goals can be found in the care plan section) Acute Rehab OT  Goals Patient Stated Goal: to be stronger and stop falling OT Goal Formulation: With patient Time For Goal Achievement: 06/29/15 Potential to Achieve Goals: Good ADL Goals Pt Will Perform Grooming: with supervision;standing Pt Will Perform Lower Body Dressing: with supervision;sit to/from stand Additional ADL Goal #1: Pt will walk to bathroom with walker and toilet on 3:1 over commode with S. Additional ADL Goal #2: Pt will do adls in standing near sink or bed for two 5 minute time frames with S to attempt to increase standing balance during adls and decrease falls.  OT Frequency: Min 2X/week   Barriers to D/C: Decreased caregiver support  pt lives alone.  Nieces and son check in once a day and bring her food.  Pt does not have a life line and states she forgets to keep a cell phone on her at all times.         Co-evaluation              End of Session Equipment Utilized During Treatment: Surveyor, mining Communication: Mobility status  Activity Tolerance: Patient limited by fatigue Patient left: in chair;with call bell/phone within reach   Time: 1135-1155 OT Time Calculation (min): 20 min Charges:  OT General Charges $OT Visit: 1 Procedure OT Evaluation $Initial OT Evaluation Tier I: 1 Procedure G-Codes:    Glenford Peers 06/20/2015, 12:14 PM  [phone]

## 2015-06-15 NOTE — Progress Notes (Signed)
Initial Nutrition Assessment  DOCUMENTATION CODES:   Not applicable  INTERVENTION:  Ensure Enlive po BID, each supplement provides 350 kcal and 20 grams of protein  NUTRITION DIAGNOSIS:   Inadequate oral intake related to inability to eat as evidenced by per patient/family report, percent weight loss.  GOAL:   Patient will meet greater than or equal to 90% of their needs  MONITOR:   I & O's, Skin, Labs, PO intake, Supplement acceptance  REASON FOR ASSESSMENT:   Consult Poor PO  ASSESSMENT:   [name] is a 79 y.o. female, with rheumatoid arthritis, diabetes, and hypertension who presents to the emergency department today after being found down. [name] reports that she has fallen 4 times in the past 24 hours. She complains of eating very little for the past 2 months and weight loss. She denies abdominal pain and reports that her niece brings her food daily but she eats very little.  Spoke with [name] at bedside. Exhibits no signs of malnutrition. [name] does have 10#/7% insignificant wt loss in 4 months.  [name]  Said appetite was good until mouth was infected. Admits to not receiving medical treatment for infection immediately. Infection got worse, and [name] lost appetite. Eventually received antibiotics for infection, still on antibiotics, states they have also zapped her appetite.  [name] drinks ensure at home, is enjoying it during stay. Follow for PO intake. Currently 0-25%  Diet Order:  Diet Carb Modified Fluid consistency:: Thin; Room service appropriate?: Yes  Skin:  Reviewed, no issues  Last BM:  06/15/2015  Height:   Ht Readings from Last 1 Encounters:  06/14/15 5\' 5"  (1.651 m)    Weight:   Wt Readings from Last 1 Encounters:  06/15/15 137 lb 2 oz (62.2 kg)    Ideal Body Weight:  56.81 kg  BMI:  Body mass index is 22.82 kg/(m^2).  Estimated Nutritional Needs:   Kcal:  1500-1800 calories  Protein:  60-70 grams  Fluid:  >/= 1.5L  EDUCATION NEEDS:    No education needs identified at this time  Satira Anis. Jozey Janco, MS, RD LDN After Hours/Weekend Pager [phone]

## 2015-06-15 NOTE — Progress Notes (Signed)
Utilization review completed. Lorana Maffeo, RN, BSN. 

## 2015-06-15 NOTE — Progress Notes (Signed)
Physical Therapy Evaluation Patient Details Name: [name] MRN: [id number] DOB: 1936/06/06 Today's Date: 06/15/2015   History of Present Illness  Pt is a 79 yo female admitted after several falls in the same day one resulting in R knee pain.  Pt has had progressive weakness and increased falls over the last few months with several visits from EMS.  Pt was orthostatic in ER.  pt with presyncopal episodes, dizziness, RA, DM, HTN.  MRIs have been -.  Pt found to have UTI.  Clinical Impression  Pt admitted with the above complications. Pt currently with functional limitations due to the deficits listed below (see PT Problem List). Reports having multiple falls at home. Currently requires close guard for safety to ambulate with a rolling walker, demonstrating a very slow pace with moderate antalgic pattern. She lives alone and would benefit from ST-SNF prior to returning home at a Mod I level to reduce risk of falls. Pt will benefit from skilled PT to increase their independence and safety with mobility to allow discharge to the venue listed below.       Follow Up Recommendations SNF    Equipment Recommendations  None recommended by PT    Recommendations for Other Services       Precautions / Restrictions Precautions Precautions: Fall Precaution Comments: pt lives alone and falls regularly.  Restrictions Weight Bearing Restrictions: No      Mobility  Bed Mobility Overal bed mobility: Needs Assistance Bed Mobility: Supine to Sit     Supine to sit: Supervision;HOB elevated     General bed mobility comments: Slow to rise. supervision for safety. no physical assist needed  Transfers Overall transfer level: Needs assistance Equipment used: Rolling walker (2 wheeled) Transfers: Sit to/from Stand Sit to Stand: Min guard         General transfer comment: Close guard for safety. VC for hand placement. performed from lowest bed setting and  recliner.  Ambulation/Gait Ambulation/Gait assistance: Min guard Ambulation Distance (Feet): 80 Feet Assistive device: Rolling walker (2 wheeled) Gait Pattern/deviations: Step-through pattern;Decreased step length - left;Decreased stance time - right;Decreased stride length;Antalgic;Trunk flexed Gait velocity: slow Gait velocity interpretation: <1.8 ft/sec, indicative of risk for recurrent falls General Gait Details: Educated on proper and safe DME use with a rolling walker. VC for upright posture and use of UEs as needed due to moderately antalgic gait pattern. No overt buckling noted however close guard provided for safety at all times.  Stairs            Wheelchair Mobility    Modified Rankin (Stroke Patients Only)       Balance Overall balance assessment: Needs assistance;History of Falls Sitting-balance support: No upper extremity supported;Feet supported Sitting balance-Leahy Scale: Good     Standing balance support: Single extremity supported Standing balance-Leahy Scale: Poor                               Pertinent Vitals/Pain Pain Assessment: Faces Faces Pain Scale: Hurts little more Pain Location: Rt knee Pain Descriptors / Indicators: Aching Pain Intervention(s): Monitored during session;Repositioned    Home Living Family/patient expects to be discharged to:: Private residence Living Arrangements: Alone Available Help at Discharge: Family;Available PRN/intermittently Type of Home: House Home Access: Stairs to enter Entrance Stairs-Rails: Can reach both;Right;Left Entrance Stairs-Number of Steps: 3 Home Layout: One level Home Equipment: Walker - 2 wheels      Prior Function Level of Independence: Independent with  assistive device(s)         Comments: Pt states she uses walker more at home lately but is still falling.  Pt has had multiple EMS visits.  Question if pt is safe living at home alone.     Hand Dominance   Dominant Hand:  Right    Extremity/Trunk Assessment   Upper Extremity Assessment: Defer to OT evaluation           Lower Extremity Assessment: Generalized weakness      Cervical / Trunk Assessment: Normal  Communication   Communication: No difficulties  Cognition Arousal/Alertness: Awake/alert Behavior During Therapy: WFL for tasks assessed/performed Overall Cognitive Status: Within Functional Limits for tasks assessed                      General Comments General comments (skin integrity, edema, etc.): Reports multiple falls at home    Exercises        Assessment/Plan    PT Assessment Patient needs continued PT services  PT Diagnosis Abnormality of gait;Acute pain;Generalized weakness;Difficulty walking   PT Problem List Decreased strength;Decreased activity tolerance;Decreased balance;Decreased mobility;Decreased knowledge of use of DME;Pain  PT Treatment Interventions DME instruction;Gait training;Functional mobility training;Therapeutic activities;Therapeutic exercise;Balance training;Neuromuscular re-education;Patient/family education;Modalities   PT Goals (Current goals can be found in the Care Plan section) Acute Rehab PT Goals Patient Stated Goal: to be stronger and stop falling PT Goal Formulation: With patient Time For Goal Achievement: 06/29/15 Potential to Achieve Goals: Good    Frequency Min 3X/week   Barriers to discharge Decreased caregiver support lives alone    Co-evaluation               End of Session Equipment Utilized During Treatment: Gait belt Activity Tolerance: Patient tolerated treatment well Patient left: in bed;with call bell/phone within reach Nurse Communication: Mobility status         Time: [id number] PT Time Calculation (min) (ACUTE ONLY): 21 min   Charges:   PT Evaluation $Initial PT Evaluation Tier I: 1 Procedure     PT G CodesEllouise Newer 06/15/2015, 6:09 PM Elayne Snare, [place]

## 2015-06-16 ENCOUNTER — Inpatient Hospital Stay (HOSPITAL_COMMUNITY): Payer: Medicare Other

## 2015-06-16 DIAGNOSIS — R2243 Localized swelling, mass and lump, lower limb, bilateral: Secondary | ICD-10-CM

## 2015-06-16 DIAGNOSIS — R0600 Dyspnea, unspecified: Secondary | ICD-10-CM

## 2015-06-16 LAB — URINE CULTURE: Culture: >=100000

## 2015-06-16 LAB — COMPREHENSIVE METABOLIC PANEL
ALK PHOS: 37 U/L — AB (ref 38–126)
ALT: 19 U/L (ref 14–54)
ANION GAP: 5 (ref 5–15)
AST: 32 U/L (ref 15–41)
Albumin: 2.4 g/dL — ABNORMAL LOW (ref 3.5–5.0)
BILIRUBIN TOTAL: 0.3 mg/dL (ref 0.3–1.2)
BUN: 6 mg/dL (ref 6–20)
CALCIUM: 8.6 mg/dL — AB (ref 8.9–10.3)
CO2: 25 mmol/L (ref 22–32)
Chloride: 108 mmol/L (ref 101–111)
Creatinine, Ser: 0.77 mg/dL (ref 0.44–1.00)
GFR calc Af Amer: >60 mL/min (ref >60)
GLUCOSE: 105 mg/dL — AB (ref 65–99)
POTASSIUM: 4.6 mmol/L (ref 3.5–5.1)
Sodium: 138 mmol/L (ref 135–145)
TOTAL PROTEIN: 5.1 g/dL — AB (ref 6.5–8.1)

## 2015-06-16 LAB — GLUCOSE, CAPILLARY
GLUCOSE-CAPILLARY: 112 mg/dL — AB (ref 65–99)
GLUCOSE-CAPILLARY: 165 mg/dL — AB (ref 65–99)
GLUCOSE-CAPILLARY: 265 mg/dL — AB (ref 65–99)
GLUCOSE-CAPILLARY: 213 mg/dL — AB (ref 65–99)

## 2015-06-16 LAB — CBC
HEMATOCRIT: 32.9 % — AB (ref 36.0–46.0)
HEMOGLOBIN: 10.4 g/dL — AB (ref 12.0–15.0)
MCH: 30.4 pg (ref 26.0–34.0)
MCHC: 31.6 g/dL (ref 30.0–36.0)
MCV: 96.2 fL (ref 78.0–100.0)
Platelets: 174 10*3/uL (ref 150–400)
RBC: 3.42 MIL/uL — ABNORMAL LOW (ref 3.87–5.11)
RDW: 13.4 % (ref 11.5–15.5)
WBC: 9.8 10*3/uL (ref 4.0–10.5)

## 2015-06-16 MED ORDER — METHYLPREDNISOLONE 2 MG PO TABS
2.0000 mg | ORAL_TABLET | Freq: Every morning | ORAL | Status: DC
  Administered 2015-06-17: 2 mg via ORAL
  Filled 2015-06-16: qty 1

## 2015-06-16 MED ORDER — SODIUM CHLORIDE 0.9 % IV SOLN
INTRAVENOUS | Status: DC
  Administered 2015-06-16: 12:00:00 via INTRAVENOUS

## 2015-06-16 MED ORDER — HYDROCORTISONE NA SUCCINATE PF 100 MG IJ SOLR
50.0000 mg | Freq: Two times a day (BID) | INTRAMUSCULAR | Status: AC
  Administered 2015-06-16: 50 mg via INTRAVENOUS
  Filled 2015-06-16 (×2): qty 2

## 2015-06-16 MED ORDER — HYDROCORTISONE 10 MG PO TABS
10.0000 mg | ORAL_TABLET | Freq: Two times a day (BID) | ORAL | Status: DC
  Administered 2015-06-17: 10 mg via ORAL
  Filled 2015-06-16 (×2): qty 1

## 2015-06-16 NOTE — Progress Notes (Signed)
Request was for prayer, which chaplain provided, but since patient mentioned missing her Bible study, chaplain also fetched Bible and read her favorite story, of the birth of [place].

## 2015-06-16 NOTE — Progress Notes (Addendum)
[facility] PROGRESS NOTE  [name] [id number] DOB: 03-04-1936 DOA: 06/14/2015 PCP: Vicki Fendt, MD  Length of stay: 2   Assessment/Plan: Principal Problem:   Acute respiratory failure with hypoxia ([place]) Active Problems:   UTI (lower urinary tract infection)   Rheumatoid arthritis ([place])   DM (diabetes mellitus) ([place])   Multiple falls   Weight loss   Orthostasis   Acute respiratory failure ([facility])    Acute respiratory failure with hypoxia Oxygen saturation drops to mid 70s when patient is speaking. Currently on 96% on room air at rest, CT scan negative for PE/pneumonia 2-D echo still pending  D-dimer was elevated, but CTA chest was negative for PE.   venous Doppler to rule out DVT still pending Continue PRN, albuterol nebs prn, flutter valve. We will also repeat chest x-ray today for comparison   Escherichia coli urinary tract infection Urine culture from 11/16 positive for greater than 100,000 colonies Patient started on Rocephin, will switch to Cipro  Orthostasis with multiple falls Patients SBP dropped from 150 to 115 in the ED with change of position. Will decrease metoprolol by half, continue IV hydration. Patient still somewhat hypotensive with systolic in the 0000000, likely the setting of her UTI  MRI of the C-spine shows widespread cervical spine degeneration  Foraminal stenosis at multiple levels , however no focal cord compression , PT/OT evaluation recommended SNF, patient declines. Normal TSH. Patient could also have adrenal insufficiency in the setting of hypokalemia? Cortisol level is 7.1 This is considered to be low therefore we'll start the patient on hydrocortisone stress dose switched to by mouth hydrocortisone tomorrow    Rheumatoid Arthritis Patient was switched from methotrexate to Plaquenil just 2 weeks ago. She takes steroids daily.  Continue Medrol 2 mg in the morning Started the patient on stress dose steroids in the setting  of UTI  Diabetes mellitus. Stable Patient has a cbg of 181 in the ER. She reports having an episode of hypoglycemia last Thursday when her CBG was 21. Continue sliding scale insulin   Weight loss Documented weight loss shows she has decreased 10 lbs in 3 months. The patient complains of having lost 35 lbs in the past year. Will request a nutrition consultation and order ensure  DVT prophylaxsis Lovenox  Code Status:      Code Status Orders        Start     Ordered   06/14/15 [id number]  Do not attempt resuscitation (DNR)   Continuous    Question Answer Comment  In the event of cardiac or respiratory ARREST Do not call a "code blue"   In the event of cardiac or respiratory ARREST Do not perform Intubation, CPR, defibrillation or ACLS   In the event of cardiac or respiratory ARREST Use medication by any route, position, wound care, and other measures to relive pain and suffering. May use oxygen, suction and manual treatment of airway obstruction as needed for comfort.      06/14/15 1747     Family Communication: family updated about patient's clinical progress Disposition Plan: Anticipate that the patient will discharge home tomorrow  Brief narrative: 79 y.o. female with a Past Medical History of RA, DM, HTN, history of chronic dizziness / recurrent pre-syncopal episodes who presents with recurrent falls in the past day, similar to her prior episodes. She has had multiple MRIs including one 4 months ago which were negative. She is orthostatic in the ED and has a UTI which  may contribute. Will check TSH and increase her steroids as may have a component of adrenal insufficiency. She does not appear very dyspneic and I am not sure why she is that hypoxic, will check an echo.  Consultants:  None  Procedures:  None  Antibiotics: Anti-infectives    Start     Dose/Rate Route Frequency Ordered Stop   06/15/15 1600  cefTRIAXone (ROCEPHIN) 1 g in dextrose 5 % 50 mL IVPB     1 g 100  mL/hr over 30 Minutes Intravenous Every 24 hours 06/14/15 1747     06/14/15 2200  hydroxychloroquine (PLAQUENIL) tablet 200 mg     200 mg Oral 2 times daily 06/14/15 1747     06/14/15 1530  cefTRIAXone (ROCEPHIN) 1 g in dextrose 5 % 50 mL IVPB     1 g 100 mL/hr over 30 Minutes Intravenous  Once 06/14/15 1527 06/14/15 1617         HPI/Subjective: Patient barely orthostatic upon presentation, presented with recurrent falls, complaining of neck pain and dizziness  Objective: Filed Vitals:   06/15/15 1800 06/15/15 2115 06/16/15 0540 06/16/15 1006  BP: 145/64 127/64 121/59 98/59  Pulse: 87 90 72 95  Temp: 97.3 F (36.3 C) 97.6 F (36.4 C) 97.5 F (36.4 C) 98.6 F (37 C)  TempSrc: Oral Oral Oral Oral  Resp:   18 18  Height:      Weight:      SpO2: 98% 96% 95% 95%    Intake/Output Summary (Last 24 hours) at 06/16/15 1110 Last data filed at 06/16/15 [id number]  Gross per 24 hour  Intake 2238.75 ml  Output    100 ml  Net 2138.75 ml    Exam:   General: Thin, frail, elderly lying in bed in NAD, niece at bedside  Psych: Normal affect and insight, Not Suicidal or Homicidal, Awake Alert, Oriented X 3.  Neuro: Grossly normal. No acute focal neuro deficits.  ENT: Ears and Eyes appear Normal, Conjunctivae clear, PER. Moist oral mucosa without erythema or exudates. Poor dentition  Neck: Supple, No lymphadenopathy appreciated  Respiratory: Symmetrical chest wall movement, Good air movement bilaterally, CTAB.  Cardiac: RRR, No Murmurs, no LE edema noted, no JVD.   Abdomen: Positive bowel sounds, Soft, Non tender, Non distended, No masses appreciated, + right CVA tenderness  Skin: No Cyanosis, Normal Skin Turgor, No Skin Rash or Bruise.  Extremities: Able to move all 4. 5/5 strength in each, no effusions. Data Review   Micro Results Recent Results (from the past 240 hour(s))  Urine culture     Status: None   Collection Time: 06/14/15  4:38 PM  Result Value Ref  Range Status   Specimen Description URINE, CATHETERIZED  Final   Special Requests NONE  Final   Culture >=100,000 COLONIES/mL ESCHERICHIA COLI  Final   Report Status 06/16/2015 FINAL  Final   Organism ID, Bacteria ESCHERICHIA COLI  Final      Susceptibility   Escherichia coli - MIC*    AMPICILLIN >=32 RESISTANT Resistant     CEFAZOLIN 16 SENSITIVE Sensitive     CEFTRIAXONE <=1 SENSITIVE Sensitive     CIPROFLOXACIN <=0.25 SENSITIVE Sensitive     GENTAMICIN <=1 SENSITIVE Sensitive     IMIPENEM <=0.25 SENSITIVE Sensitive     NITROFURANTOIN <=16 SENSITIVE Sensitive     TRIMETH/SULFA <=20 SENSITIVE Sensitive     AMPICILLIN/SULBACTAM >=32 RESISTANT Resistant     PIP/TAZO <=4 SENSITIVE Sensitive     * >=100,000 COLONIES/mL ESCHERICHIA COLI  Radiology Reports Dg Chest 2 View  06/15/2015  CLINICAL DATA:  Dyspnea on exertion EXAM: CHEST  2 VIEW COMPARISON:  06/14/2015 FINDINGS: Elevated right hemidiaphragm with right lower lobe atelectasis, slightly increased in the interval. Heart size mildly enlarged. Negative for heart failure. Left lung clear. Cement vertebroplasty at approximately T12 and L1 IMPRESSION: Slight increase in right lower lobe atelectasis. No other new findings. Electronically Signed   By: Franchot Gallo M.D.   On: 06/15/2015 07:51   Dg Chest 2 View  06/14/2015  CLINICAL DATA:  Fall, RIGHT hip pain and knee pain EXAM: CHEST  2 VIEW COMPARISON:  03/23/2015 FINDINGS: Normal cardiac silhouette. Mild LEFT basilar atelectasis. No effusion, infiltrate, pneumothorax. Vertebroplasty augmentation of the lower thoracic spine. IMPRESSION: LEFT basilar atelectasis.  No evidence of thoracic trauma. Electronically Signed   By: Suzy Bouchard M.D.   On: 06/14/2015 13:27   Ct Head Wo Contrast  06/14/2015  CLINICAL DATA:  Fall from bed with headaches and dizziness EXAM: CT HEAD WITHOUT CONTRAST TECHNIQUE: Contiguous axial images were obtained from the base of the skull through the vertex  without intravenous contrast. COMPARISON:  01/22/2015 FINDINGS: The bony calvarium is intact. No gross soft tissue abnormality is seen. Mild atrophic changes are noted. No acute hemorrhage, acute infarction or space-occupying mass lesion is identified. IMPRESSION: Atrophic changes without acute abnormality. Electronically Signed   By: Inez Catalina M.D.   On: 06/14/2015 13:42   Ct Angio Chest Pe W/cm &/or Wo Cm  06/14/2015  CLINICAL DATA:  Shortness of breath with chest pain and generalized weakness for 2 days. History of hypertension, diabetes and chronic bronchitis. Question pulmonary embolism. Initial encounter. EXAM: CT ANGIOGRAPHY CHEST WITH CONTRAST TECHNIQUE: Multidetector CT imaging of the chest was performed using the standard protocol during bolus administration of intravenous contrast. Multiplanar CT image reconstructions and MIPs were obtained to evaluate the vascular anatomy. CONTRAST:  27mL OMNIPAQUE IOHEXOL 350 MG/ML SOLN COMPARISON:  Radiographs 06/14/2015.  CT 01/22/2015. FINDINGS: Mediastinum: The pulmonary arteries are well opacified with contrast. There is no evidence of acute pulmonary embolism. There is stable mild atherosclerosis. The heart size is normal. There is no pericardial effusion. There are no enlarged mediastinal, hilar or axillary lymph nodes. The thyroid gland, trachea and esophagus demonstrate no significant findings. Lungs/Pleura: There is no pleural effusion.There is scattered subsegmental atelectasis at both lung bases, largely dependent and similar to the prior examination. The more focal density previously noted in the lingula has resolved. There are scattered calcified granulomas in both lungs. Upper abdomen: Hepatic steatosis and chronic extrahepatic biliary dilatation are noted status post cholecystectomy. There is a peripherally calcified exophytic lesion projecting anteriorly from the gastric fundus, measuring 2.2 cm on image 70. This has not significantly changed  from the most recent study. It appeared non calcified on prior studies dating back to 2007. Although previously questioned to reflect a diverticulum, this may reflect a chronic GIST tumor. Musculoskeletal/Chest wall: No chest wall lesion or acute osseous findings.Stable findings status post remote thoracolumbar spinal augmentation. Review of the MIP images confirms the above findings. IMPRESSION: 1. No evidence of acute pulmonary embolism or other acute chest findings. 2. Patchy atelectasis in both lungs with interval resolution of more focal density previously noted in the lingula. No suspicious pulmonary nodules. 3. Mild atherosclerosis. 4. 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 Electronically Signed   By: Richardean Sale M.D.   On: 06/14/2015 15:53  Mr Cervical Spine Wo Contrast  06/15/2015  CLINICAL DATA:  79 year old female who fell yesterday. Pain radiating to the shoulders and between the scapula. Initial encounter. EXAM: MRI CERVICAL SPINE WITHOUT CONTRAST TECHNIQUE: Multiplanar, multisequence MR imaging of the cervical spine was performed. No intravenous contrast was administered. COMPARISON:  Head CT without contrast 06/14/2015. Cervical spine MRI 09/21/2007. FINDINGS: Widespread chronic cervical disc and endplate degeneration. Stable vertebral height and alignment since 2009. No marrow edema or evidence of acute osseous abnormality. Suggestion of right side C4-C5 facet ankylosis which is new since 2009. No abnormal signal in the cervical ligamentous complex. Cervicomedullary junction is within normal limits. No spinal cord signal abnormality. Negative paraspinal soft tissues. C2-C3: Moderate bilateral facet hypertrophy greater on the right has progressed since 2009, but only borderline to mild C3 foraminal stenosis results. C3-C4: Chronic disc space loss and disc osteophyte complex with broad-based posterior component  of disc. Ligament flavum hypertrophy. Moderate facet hypertrophy greater on the right. Borderline to mild spinal stenosis with no cord mass effect. Severe right greater than left C4 foraminal stenosis. The left side foraminal stenosis has progressed. C4-C5: Moderate to severe facet hypertrophy greater on the right where there may be facet ankylosis. Uncovertebral hypertrophy. No spinal stenosis. Moderate bilateral C5 foraminal stenosis is stable. C5-C6: Chronic circumferential disc osteophyte complex. There may now be anterior interbody ankylosis. Broad-based central component of disc is stable. This narrows the ventral CSF space but there is no significant spinal stenosis. Facet and uncovertebral hypertrophy. Moderate bilateral C6 foraminal stenosis is stable. C6-C7: Chronic circumferential disc osteophyte complex with broad-based posterior component of disc. Mild to moderate ligament flavum hypertrophy. Spinal stenosis with no definite spinal cord mass effect. Disc and uncovertebral hypertrophy affecting both neural foramina. Severe left and moderate right C7 foraminal stenosis. This level is stable. C7-T1:  Moderate bilateral facet hypertrophy.  No stenosis. No upper thoracic spinal stenosis. IMPRESSION: 1. No acute traumatic injury identified in the cervical spine, however, noncontrast CT has greater sensitivity than MRI for acute cervical spine fracture. 2. Chronic widespread cervical spine degeneration. Suspect ankylosis of the C4-C5 posterior elements and C5-C6 interbody space since 2009. Subsequent foraminal stenosis at both of those levels is stable. 3. Mild multifactorial spinal and moderate to severe foraminal stenosis at C6-C7 is stable. 4. Moderate to severe bilateral neural foraminal stenosis at C3-C4, progressed on the left. Electronically Signed   By: Genevie Ann M.D.   On: 06/15/2015 10:50   Dg Knee Complete 4 Views Right  06/14/2015  CLINICAL DATA:  Initial encounter for [facility] with right hip and  knee pain EXAM: RIGHT KNEE - COMPLETE 4+ VIEW COMPARISON:  06/16/2010 FINDINGS: No acute fracture or dislocation. Mild medial and patellofemoral compartment osteoarthritis with joint space narrowing. Enthesophyte at the quadriceps insertion. No joint effusion. IMPRESSION: Degenerative change, without acute osseous finding. Electronically Signed   By: Abigail Miyamoto M.D.   On: 06/14/2015 13:27   Dg Hip Unilat With Pelvis 2-3 Views Right  06/14/2015  CLINICAL DATA:  Fall, RIGHT hip and knee pain EXAM: DG HIP (WITH OR WITHOUT PELVIS) 2-3V RIGHT COMPARISON:  None FINDINGS: Diffuse osseous demineralization. Hip and SI joint spaces preserved. No acute fracture, dislocation or bone destruction. Degenerative disc disease changes at visualized lower lumbar spine. Scattered pelvic phleboliths. IMPRESSION: Osseous demineralization with degenerative disc disease changes at visualized lower lumbar spine. No acute osseous abnormalities. Electronically Signed   By: Lavonia Dana M.D.   On: 06/14/2015 13:29     CBC  Recent Labs Lab 06/14/15 1232 06/14/15 1924 06/15/15 0600 06/16/15 0536  WBC 11.5* 11.2* 7.9 9.8  HGB 12.7 11.4* 10.5* 10.4*  HCT 39.1 35.5* 33.6* 32.9*  PLT 222 188 174 174  MCV 96.1 97.0 97.1 96.2  MCH 31.2 31.1 30.3 30.4  MCHC 32.5 32.1 31.3 31.6  RDW 13.8 14.0 13.8 13.4  LYMPHSABS 2.4  --   --   --   MONOABS 1.2*  --   --   --   EOSABS 0.0  --   --   --   BASOSABS 0.0  --   --   --     Chemistries   Recent Labs Lab 06/14/15 1232 06/14/15 1924 06/15/15 0600 06/16/15 0536  NA 140  --  139 138  K 3.9  --  3.3* 4.6  CL 97*  --  105 108  CO2 35*  --  28 25  GLUCOSE 181*  --  112* 105*  BUN 12  --  6 6  CREATININE 1.22* 0.98 0.83 0.77  CALCIUM 9.8  --  8.2* 8.6*  AST 55*  --   --  32  ALT 32  --   --  19  ALKPHOS 48  --   --  37*  BILITOT 1.1  --   --  0.3    ------------------------------------------------------------------------------------------------------------------ estimated creatinine clearance is 53 mL/min (by C-G formula based on Cr of 0.77). ------------------------------------------------------------------------------------------------------------------  Recent Labs  06/14/15 1924  HGBA1C 5.9*   ------------------------------------------------------------------------------------------------------------------ No results for input(s): CHOL, HDL, LDLCALC, TRIG, CHOLHDL, LDLDIRECT in the last 72 hours. ------------------------------------------------------------------------------------------------------------------  Recent Labs  06/14/15 1924  TSH 1.254   ------------------------------------------------------------------------------------------------------------------ No results for input(s): VITAMINB12, FOLATE, FERRITIN, TIBC, IRON, RETICCTPCT in the last 72 hours.  Coagulation profile No results for input(s): INR, PROTIME in the last 168 hours.   Recent Labs  06/14/15 1232  DDIMER 5.87*    Cardiac Enzymes  Recent Labs Lab 06/14/15 1232  TROPONINI <0.03   ------------------------------------------------------------------------------------------------------------------ Invalid input(s): POCBNP   CBG:  Recent Labs Lab 06/15/15 0757 06/15/15 1104 06/15/15 1639 06/15/15 2231 06/16/15 0757  GLUCAP 134* 100* 138* 156* 112*       Studies: Dg Chest 2 View  06/15/2015  CLINICAL DATA:  Dyspnea on exertion EXAM: CHEST  2 VIEW COMPARISON:  06/14/2015 FINDINGS: Elevated right hemidiaphragm with right lower lobe atelectasis, slightly increased in the interval. Heart size mildly enlarged. Negative for heart failure. Left lung clear. Cement vertebroplasty at approximately T12 and L1 IMPRESSION: Slight increase in right lower lobe atelectasis. No other new findings. Electronically Signed   By: Franchot Gallo M.D.    On: 06/15/2015 07:51   Dg Chest 2 View  06/14/2015  CLINICAL DATA:  Fall, RIGHT hip pain and knee pain EXAM: CHEST  2 VIEW COMPARISON:  03/23/2015 FINDINGS: Normal cardiac silhouette. Mild LEFT basilar atelectasis. No effusion, infiltrate, pneumothorax. Vertebroplasty augmentation of the lower thoracic spine. IMPRESSION: LEFT basilar atelectasis.  No evidence of thoracic trauma. Electronically Signed   By: Suzy Bouchard M.D.   On: 06/14/2015 13:27   Ct Head Wo Contrast  06/14/2015  CLINICAL DATA:  Fall from bed with headaches and dizziness EXAM: CT HEAD WITHOUT CONTRAST TECHNIQUE: Contiguous axial images were obtained from the base of the skull through the vertex without intravenous contrast. COMPARISON:  01/22/2015 FINDINGS: The bony calvarium is intact. No gross soft tissue abnormality is seen. Mild atrophic changes are noted. No acute hemorrhage, acute infarction or space-occupying mass lesion is identified. IMPRESSION:  Atrophic changes without acute abnormality. Electronically Signed   By: Inez Catalina M.D.   On: 06/14/2015 13:42   Ct Angio Chest Pe W/cm &/or Wo Cm  06/14/2015  CLINICAL DATA:  Shortness of breath with chest pain and generalized weakness for 2 days. History of hypertension, diabetes and chronic bronchitis. Question pulmonary embolism. Initial encounter. EXAM: CT ANGIOGRAPHY CHEST WITH CONTRAST TECHNIQUE: Multidetector CT imaging of the chest was performed using the standard protocol during bolus administration of intravenous contrast. Multiplanar CT image reconstructions and MIPs were obtained to evaluate the vascular anatomy. CONTRAST:  74mL OMNIPAQUE IOHEXOL 350 MG/ML SOLN COMPARISON:  Radiographs 06/14/2015.  CT 01/22/2015. FINDINGS: Mediastinum: The pulmonary arteries are well opacified with contrast. There is no evidence of acute pulmonary embolism. There is stable mild atherosclerosis. The heart size is normal. There is no pericardial effusion. There are no enlarged  mediastinal, hilar or axillary lymph nodes. The thyroid gland, trachea and esophagus demonstrate no significant findings. Lungs/Pleura: There is no pleural effusion.There is scattered subsegmental atelectasis at both lung bases, largely dependent and similar to the prior examination. The more focal density previously noted in the lingula has resolved. There are scattered calcified granulomas in both lungs. Upper abdomen: Hepatic steatosis and chronic extrahepatic biliary dilatation are noted status post cholecystectomy. There is a peripherally calcified exophytic lesion projecting anteriorly from the gastric fundus, measuring 2.2 cm on image 70. This has not significantly changed from the most recent study. It appeared non calcified on prior studies dating back to 2007. Although previously questioned to reflect a diverticulum, this may reflect a chronic GIST tumor. Musculoskeletal/Chest wall: No chest wall lesion or acute osseous findings.Stable findings status post remote thoracolumbar spinal augmentation. Review of the MIP images confirms the above findings. IMPRESSION: 1. No evidence of acute pulmonary embolism or other acute chest findings. 2. Patchy atelectasis in both lungs with interval resolution of more focal density previously noted in the lingula. No suspicious pulmonary nodules. 3. Mild atherosclerosis. 4. 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 Electronically Signed   By: Richardean Sale M.D.   On: 06/14/2015 15:53   Mr Cervical Spine Wo Contrast  06/15/2015  CLINICAL DATA:  79 year old female who fell yesterday. Pain radiating to the shoulders and between the scapula. Initial encounter. EXAM: MRI CERVICAL SPINE WITHOUT CONTRAST TECHNIQUE: Multiplanar, multisequence MR imaging of the cervical spine was performed. No intravenous contrast was administered. COMPARISON:  Head CT without contrast 06/14/2015. Cervical spine  MRI 09/21/2007. FINDINGS: Widespread chronic cervical disc and endplate degeneration. Stable vertebral height and alignment since 2009. No marrow edema or evidence of acute osseous abnormality. Suggestion of right side C4-C5 facet ankylosis which is new since 2009. No abnormal signal in the cervical ligamentous complex. Cervicomedullary junction is within normal limits. No spinal cord signal abnormality. Negative paraspinal soft tissues. C2-C3: Moderate bilateral facet hypertrophy greater on the right has progressed since 2009, but only borderline to mild C3 foraminal stenosis results. C3-C4: Chronic disc space loss and disc osteophyte complex with broad-based posterior component of disc. Ligament flavum hypertrophy. Moderate facet hypertrophy greater on the right. Borderline to mild spinal stenosis with no cord mass effect. Severe right greater than left C4 foraminal stenosis. The left side foraminal stenosis has progressed. C4-C5: Moderate to severe facet hypertrophy greater on the right where there may be facet ankylosis. Uncovertebral hypertrophy. No spinal stenosis. Moderate bilateral C5 foraminal stenosis is stable. C5-C6: Chronic circumferential disc osteophyte complex. There  may now be anterior interbody ankylosis. Broad-based central component of disc is stable. This narrows the ventral CSF space but there is no significant spinal stenosis. Facet and uncovertebral hypertrophy. Moderate bilateral C6 foraminal stenosis is stable. C6-C7: Chronic circumferential disc osteophyte complex with broad-based posterior component of disc. Mild to moderate ligament flavum hypertrophy. Spinal stenosis with no definite spinal cord mass effect. Disc and uncovertebral hypertrophy affecting both neural foramina. Severe left and moderate right C7 foraminal stenosis. This level is stable. C7-T1:  Moderate bilateral facet hypertrophy.  No stenosis. No upper thoracic spinal stenosis. IMPRESSION: 1. No acute traumatic injury  identified in the cervical spine, however, noncontrast CT has greater sensitivity than MRI for acute cervical spine fracture. 2. Chronic widespread cervical spine degeneration. Suspect ankylosis of the C4-C5 posterior elements and C5-C6 interbody space since 2009. Subsequent foraminal stenosis at both of those levels is stable. 3. Mild multifactorial spinal and moderate to severe foraminal stenosis at C6-C7 is stable. 4. Moderate to severe bilateral neural foraminal stenosis at C3-C4, progressed on the left. Electronically Signed   By: Genevie Ann M.D.   On: 06/15/2015 10:50   Dg Knee Complete 4 Views Right  06/14/2015  CLINICAL DATA:  Initial encounter for [facility] with right hip and knee pain EXAM: RIGHT KNEE - COMPLETE 4+ VIEW COMPARISON:  06/16/2010 FINDINGS: No acute fracture or dislocation. Mild medial and patellofemoral compartment osteoarthritis with joint space narrowing. Enthesophyte at the quadriceps insertion. No joint effusion. IMPRESSION: Degenerative change, without acute osseous finding. Electronically Signed   By: Abigail Miyamoto M.D.   On: 06/14/2015 13:27   Dg Hip Unilat With Pelvis 2-3 Views Right  06/14/2015  CLINICAL DATA:  Fall, RIGHT hip and knee pain EXAM: DG HIP (WITH OR WITHOUT PELVIS) 2-3V RIGHT COMPARISON:  None FINDINGS: Diffuse osseous demineralization. Hip and SI joint spaces preserved. No acute fracture, dislocation or bone destruction. Degenerative disc disease changes at visualized lower lumbar spine. Scattered pelvic phleboliths. IMPRESSION: Osseous demineralization with degenerative disc disease changes at visualized lower lumbar spine. No acute osseous abnormalities. Electronically Signed   By: Lavonia Dana M.D.   On: 06/14/2015 13:29      Lab Results  Component Value Date   HGBA1C 5.9* 06/14/2015   HGBA1C 8.4* 01/22/2015   HGBA1C 8.5* 04/07/2014   Lab Results  Component Value Date   MICROALBUR 4.02* 11/07/2008   LDLCALC 86 07/02/2010   CREATININE 0.77 06/16/2015        Scheduled Meds: . aspirin EC  81 mg Oral Daily  . atorvastatin  20 mg Oral q morning - 10a  . cefTRIAXone (ROCEPHIN)  IV  1 g Intravenous Q24H  . docusate sodium  100 mg Oral BID  . enoxaparin (LOVENOX) injection  40 mg Subcutaneous Q24H  . feeding supplement (ENSURE ENLIVE)  237 mL Oral BID BM  . fluticasone  1 spray Each Nare Daily  . folic acid  1 mg Oral Daily  . hydroxychloroquine  200 mg Oral BID  . insulin aspart  0-9 Units Subcutaneous TID WC  . insulin aspart  3 Units Subcutaneous TID WC  . methylPREDNISolone  4 mg Oral q morning - 10a  . metoprolol tartrate  12.5 mg Oral BID  . sodium chloride  3 mL Intravenous Q12H   Continuous Infusions: . 0.9 % NaCl with KCl 40 mEq / L 75 mL/hr (06/16/15 0330)    Principal Problem:   Acute respiratory failure with hypoxia (HCC) Active Problems:   UTI (lower urinary tract  infection)   Rheumatoid arthritis ([place])   DM (diabetes mellitus) ([place])   Multiple falls   Weight loss   Orthostasis   Acute respiratory failure ([place])    Time spent: 45 minutes   [facility] Pager [phone]. If 7PM-7AM, please contact night-coverage at [email or web address], password [facility] 06/16/2015, 11:09 AM  LOS: 2 days

## 2015-06-16 NOTE — Progress Notes (Signed)
Preliminary results by tech - Negative for deep and superficial vein thrombosis in both lower extremities.  Oda Cogan, BS, RDMS, RVT

## 2015-06-16 NOTE — Progress Notes (Signed)
  Echocardiogram 2D Echocardiogram has been performed.  [name] 06/16/2015, 2:36 PM

## 2015-06-17 LAB — COMPREHENSIVE METABOLIC PANEL
ALBUMIN: 2.4 g/dL — AB (ref 3.5–5.0)
ALK PHOS: 36 U/L — AB (ref 38–126)
ALT: 18 U/L (ref 14–54)
AST: 26 U/L (ref 15–41)
Anion gap: 8 (ref 5–15)
BUN: 6 mg/dL (ref 6–20)
CALCIUM: 8.7 mg/dL — AB (ref 8.9–10.3)
CO2: 23 mmol/L (ref 22–32)
CREATININE: 0.68 mg/dL (ref 0.44–1.00)
Chloride: 109 mmol/L (ref 101–111)
GFR calc Af Amer: >60 mL/min (ref >60)
GFR calc non Af Amer: >60 mL/min (ref >60)
GLUCOSE: 101 mg/dL — AB (ref 65–99)
Potassium: 4.7 mmol/L (ref 3.5–5.1)
SODIUM: 140 mmol/L (ref 135–145)
Total Bilirubin: 0.3 mg/dL (ref 0.3–1.2)
Total Protein: 5.3 g/dL — ABNORMAL LOW (ref 6.5–8.1)

## 2015-06-17 LAB — CBC
HCT: 32.6 % — ABNORMAL LOW (ref 36.0–46.0)
Hemoglobin: 10.3 g/dL — ABNORMAL LOW (ref 12.0–15.0)
MCH: 30.5 pg (ref 26.0–34.0)
MCHC: 31.6 g/dL (ref 30.0–36.0)
MCV: 96.4 fL (ref 78.0–100.0)
PLATELETS: 186 10*3/uL (ref 150–400)
RBC: 3.38 MIL/uL — ABNORMAL LOW (ref 3.87–5.11)
RDW: 13.2 % (ref 11.5–15.5)
WBC: 10.9 10*3/uL — ABNORMAL HIGH (ref 4.0–10.5)

## 2015-06-17 LAB — GLUCOSE, CAPILLARY: GLUCOSE-CAPILLARY: 100 mg/dL — AB (ref 65–99)

## 2015-06-17 MED ORDER — METOPROLOL TARTRATE 25 MG PO TABS: 12.5000 mg | ORAL_TABLET | Freq: Two times a day (BID) | ORAL | Status: DC

## 2015-06-17 MED ORDER — TRAZODONE HCL 100 MG PO TABS: 100.0000 mg | ORAL_TABLET | Freq: Every evening | ORAL | Status: DC | PRN

## 2015-06-17 MED ORDER — HYDROCORTISONE 10 MG PO TABS: 10.0000 mg | ORAL_TABLET | Freq: Two times a day (BID) | ORAL | Status: DC

## 2015-06-17 MED ORDER — DOCUSATE SODIUM 100 MG PO CAPS: 100.0000 mg | ORAL_CAPSULE | Freq: Two times a day (BID) | ORAL | Status: DC

## 2015-06-17 MED ORDER — ENSURE ENLIVE PO LIQD: 237.0000 mL | Freq: Two times a day (BID) | ORAL | Status: DC

## 2015-06-17 MED ORDER — CEPHALEXIN 500 MG PO CAPS: 500.0000 mg | ORAL_CAPSULE | Freq: Four times a day (QID) | ORAL | Status: AC

## 2015-06-29 DIAGNOSIS — E119 Type 2 diabetes mellitus without complications: Secondary | ICD-10-CM | POA: Diagnosis not present

## 2015-06-29 DIAGNOSIS — K5900 Constipation, unspecified: Secondary | ICD-10-CM | POA: Diagnosis not present

## 2015-06-29 DIAGNOSIS — M25559 Pain in unspecified hip: Secondary | ICD-10-CM | POA: Diagnosis not present

## 2015-06-29 DIAGNOSIS — I10 Essential (primary) hypertension: Secondary | ICD-10-CM | POA: Diagnosis not present

## 2015-06-29 DIAGNOSIS — N760 Acute vaginitis: Secondary | ICD-10-CM | POA: Diagnosis not present

## 2015-07-03 NOTE — Discharge Summary (Signed)
Physician Discharge Summary  Octavia Metro MRN: [id number] DOB/AGE: 77-Dec-1937 77 y.o.  PCP: [name], MD   Admit date: 06/14/2015 Discharge date: 07/03/2015  Discharge Diagnoses:     Principal Problem:   Acute respiratory failure with hypoxia ([place]) Active Problems:   UTI (lower urinary tract infection)   Rheumatoid arthritis (HCC)   DM (diabetes mellitus) ([place])   Multiple falls   Weight loss   Orthostasis   Acute respiratory failure ([place])    Follow-up recommendations Follow-up with PCP in 3-5 days , including all  additional recommended appointments as below Follow-up CBC, CMP in 3-5 days      Medication List    STOP taking these medications        ibuprofen 800 MG tablet  Commonly known as:  ADVIL,MOTRIN      TAKE these medications        albuterol 108 (90 BASE) MCG/ACT inhaler  Commonly known as:  PROVENTIL HFA;VENTOLIN HFA  Inhale 2 puffs into the lungs every 6 (six) hours as needed for wheezing or shortness of breath.     aspirin EC 81 MG tablet  Take 1 tablet (81 mg total) by mouth daily.     atorvastatin 20 MG tablet  Commonly known as:  LIPITOR  Take 20 mg by mouth every morning.     docusate sodium 100 MG capsule  Commonly known as:  COLACE  Take 1 capsule (100 mg total) by mouth 2 (two) times daily.     feeding supplement (ENSURE ENLIVE) Liqd  Take 237 mLs by mouth 2 (two) times daily between meals.     fluticasone 50 MCG/ACT nasal spray  Commonly known as:  FLONASE  instill 2 sprays into each nostril once daily     folic acid 1 MG tablet  Commonly known as:  FOLVITE  Take 1 mg by mouth daily.     hydrocortisone 10 MG tablet  Commonly known as:  CORTEF  Take 1 tablet (10 mg total) by mouth 2 (two) times daily.     hydroxychloroquine 200 MG tablet  Commonly known as:  PLAQUENIL  Take 200 mg by mouth 2 (two) times daily.     insulin glargine 100 unit/mL Sopn  Commonly known as:  LANTUS  Inject 0.05 mLs (5 Units total) into  the skin daily at 10 pm.     JANUVIA 100 MG tablet  Generic drug:  sitaGLIPtin  Take 100 mg by mouth daily.     magic mouthwash w/lidocaine Soln  Take 5 mLs by mouth 3 (three) times daily as needed for mouth pain.     methylPREDNISolone 4 MG tablet  Commonly known as:  MEDROL  Take 2 mg by mouth every morning.     metoprolol tartrate 25 MG tablet  Commonly known as:  LOPRESSOR  Take 0.5 tablets (12.5 mg total) by mouth 2 (two) times daily.     SUMAtriptan 50 MG tablet  Commonly known as:  IMITREX  Take 1 tablet (50 mg total) by mouth every 2 (two) hours as needed for migraine.     traMADol 50 MG tablet  Commonly known as:  ULTRAM  Take 50 mg by mouth 2 (two) times daily as needed for pain.     traZODone 100 MG tablet  Commonly known as:  DESYREL  Take 1 tablet (100 mg total) by mouth at bedtime as needed for sleep.      cipro 250 mg PO bid       Discharge Condition:  Discharge Instructions       Discharge Instructions    Diet - low sodium heart healthy    Complete by:  As directed      Increase activity slowly    Complete by:  As directed            No Known Allergies    Disposition: 01-Home or Self Care   Consults:      Significant Diagnostic Studies:  Dg Chest 2 View  06/16/2015  CLINICAL DATA:  Cough, recent fall EXAM: CHEST  2 VIEW COMPARISON:  06/15/2015 FINDINGS: Platelike atelectasis in the right lower lobe. No focal consolidation. No pleural effusion or pneumothorax. The heart is normal in size. Prior vertebral augmentation in the lower thoracic spine. IMPRESSION: No evidence of acute cardiopulmonary disease. Electronically Signed   By: Julian Hy M.D.   On: 06/16/2015 12:28   Dg Chest 2 View  06/15/2015  CLINICAL DATA:  Dyspnea on exertion EXAM: CHEST  2 VIEW COMPARISON:  06/14/2015 FINDINGS: Elevated right hemidiaphragm with right lower lobe atelectasis, slightly increased in the interval. Heart size mildly enlarged. Negative  for heart failure. Left lung clear. Cement vertebroplasty at approximately T12 and L1 IMPRESSION: Slight increase in right lower lobe atelectasis. No other new findings. Electronically Signed   By: Franchot Gallo M.D.   On: 06/15/2015 07:51   Dg Chest 2 View  06/14/2015  CLINICAL DATA:  Fall, RIGHT hip pain and knee pain EXAM: CHEST  2 VIEW COMPARISON:  03/23/2015 FINDINGS: Normal cardiac silhouette. Mild LEFT basilar atelectasis. No effusion, infiltrate, pneumothorax. Vertebroplasty augmentation of the lower thoracic spine. IMPRESSION: LEFT basilar atelectasis.  No evidence of thoracic trauma. Electronically Signed   By: Suzy Bouchard M.D.   On: 06/14/2015 13:27   Ct Head Wo Contrast  06/14/2015  CLINICAL DATA:  Fall from bed with headaches and dizziness EXAM: CT HEAD WITHOUT CONTRAST TECHNIQUE: Contiguous axial images were obtained from the base of the skull through the vertex without intravenous contrast. COMPARISON:  01/22/2015 FINDINGS: The bony calvarium is intact. No gross soft tissue abnormality is seen. Mild atrophic changes are noted. No acute hemorrhage, acute infarction or space-occupying mass lesion is identified. IMPRESSION: Atrophic changes without acute abnormality. Electronically Signed   By: Inez Catalina M.D.   On: 06/14/2015 13:42   Ct Angio Chest Pe W/cm &/or Wo Cm  06/14/2015  CLINICAL DATA:  Shortness of breath with chest pain and generalized weakness for 2 days. History of hypertension, diabetes and chronic bronchitis. Question pulmonary embolism. Initial encounter. EXAM: CT ANGIOGRAPHY CHEST WITH CONTRAST TECHNIQUE: Multidetector CT imaging of the chest was performed using the standard protocol during bolus administration of intravenous contrast. Multiplanar CT image reconstructions and MIPs were obtained to evaluate the vascular anatomy. CONTRAST:  44mL OMNIPAQUE IOHEXOL 350 MG/ML SOLN COMPARISON:  Radiographs 06/14/2015.  CT 01/22/2015. FINDINGS: Mediastinum: The pulmonary  arteries are well opacified with contrast. There is no evidence of acute pulmonary embolism. There is stable mild atherosclerosis. The heart size is normal. There is no pericardial effusion. There are no enlarged mediastinal, hilar or axillary lymph nodes. The thyroid gland, trachea and esophagus demonstrate no significant findings. Lungs/Pleura: There is no pleural effusion.There is scattered subsegmental atelectasis at both lung bases, largely dependent and similar to the prior examination. The more focal density previously noted in the lingula has resolved. There are scattered calcified granulomas in both lungs. Upper abdomen: Hepatic steatosis and chronic extrahepatic biliary dilatation are noted status post cholecystectomy. There is a  peripherally calcified exophytic lesion projecting anteriorly from the gastric fundus, measuring 2.2 cm on image 70. This has not significantly changed from the most recent study. It appeared non calcified on prior studies dating back to 2007. Although previously questioned to reflect a diverticulum, this may reflect a chronic GIST tumor. Musculoskeletal/Chest wall: No chest wall lesion or acute osseous findings.Stable findings status post remote thoracolumbar spinal augmentation. Review of the MIP images confirms the above findings. IMPRESSION: 1. No evidence of acute pulmonary embolism or other acute chest findings. 2. Patchy atelectasis in both lungs with interval resolution of more focal density previously noted in the lingula. No suspicious pulmonary nodules. 3. Mild atherosclerosis. 4. 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 Electronically Signed   By: Richardean Sale M.D.   On: 06/14/2015 15:53   Mr Cervical Spine Wo Contrast  06/15/2015  CLINICAL DATA:  79 year old female who fell yesterday. Pain radiating to the shoulders and between the scapula. Initial encounter. EXAM: MRI CERVICAL  SPINE WITHOUT CONTRAST TECHNIQUE: Multiplanar, multisequence MR imaging of the cervical spine was performed. No intravenous contrast was administered. COMPARISON:  Head CT without contrast 06/14/2015. Cervical spine MRI 09/21/2007. FINDINGS: Widespread chronic cervical disc and endplate degeneration. Stable vertebral height and alignment since 2009. No marrow edema or evidence of acute osseous abnormality. Suggestion of right side C4-C5 facet ankylosis which is new since 2009. No abnormal signal in the cervical ligamentous complex. Cervicomedullary junction is within normal limits. No spinal cord signal abnormality. Negative paraspinal soft tissues. C2-C3: Moderate bilateral facet hypertrophy greater on the right has progressed since 2009, but only borderline to mild C3 foraminal stenosis results. C3-C4: Chronic disc space loss and disc osteophyte complex with broad-based posterior component of disc. Ligament flavum hypertrophy. Moderate facet hypertrophy greater on the right. Borderline to mild spinal stenosis with no cord mass effect. Severe right greater than left C4 foraminal stenosis. The left side foraminal stenosis has progressed. C4-C5: Moderate to severe facet hypertrophy greater on the right where there may be facet ankylosis. Uncovertebral hypertrophy. No spinal stenosis. Moderate bilateral C5 foraminal stenosis is stable. C5-C6: Chronic circumferential disc osteophyte complex. There may now be anterior interbody ankylosis. Broad-based central component of disc is stable. This narrows the ventral CSF space but there is no significant spinal stenosis. Facet and uncovertebral hypertrophy. Moderate bilateral C6 foraminal stenosis is stable. C6-C7: Chronic circumferential disc osteophyte complex with broad-based posterior component of disc. Mild to moderate ligament flavum hypertrophy. Spinal stenosis with no definite spinal cord mass effect. Disc and uncovertebral hypertrophy affecting both neural foramina.  Severe left and moderate right C7 foraminal stenosis. This level is stable. C7-T1:  Moderate bilateral facet hypertrophy.  No stenosis. No upper thoracic spinal stenosis. IMPRESSION: 1. No acute traumatic injury identified in the cervical spine, however, noncontrast CT has greater sensitivity than MRI for acute cervical spine fracture. 2. Chronic widespread cervical spine degeneration. Suspect ankylosis of the C4-C5 posterior elements and C5-C6 interbody space since 2009. Subsequent foraminal stenosis at both of those levels is stable. 3. Mild multifactorial spinal and moderate to severe foraminal stenosis at C6-C7 is stable. 4. Moderate to severe bilateral neural foraminal stenosis at C3-C4, progressed on the left. Electronically Signed   By: Genevie Ann M.D.   On: 06/15/2015 10:50   Dg Knee Complete 4 Views Right  06/14/2015  CLINICAL DATA:  Initial encounter for [facility] with right hip and knee pain EXAM: RIGHT KNEE - COMPLETE 4+  VIEW COMPARISON:  06/16/2010 FINDINGS: No acute fracture or dislocation. Mild medial and patellofemoral compartment osteoarthritis with joint space narrowing. Enthesophyte at the quadriceps insertion. No joint effusion. IMPRESSION: Degenerative change, without acute osseous finding. Electronically Signed   By: Abigail Miyamoto M.D.   On: 06/14/2015 13:27   Dg Hip Unilat With Pelvis 2-3 Views Right  06/14/2015  CLINICAL DATA:  Fall, RIGHT hip and knee pain EXAM: DG HIP (WITH OR WITHOUT PELVIS) 2-3V RIGHT COMPARISON:  None FINDINGS: Diffuse osseous demineralization. Hip and SI joint spaces preserved. No acute fracture, dislocation or bone destruction. Degenerative disc disease changes at visualized lower lumbar spine. Scattered pelvic phleboliths. IMPRESSION: Osseous demineralization with degenerative disc disease changes at visualized lower lumbar spine. No acute osseous abnormalities. Electronically Signed   By: Lavonia Dana M.D.   On: 06/14/2015 13:29       Filed Weights   06/14/15  1138 06/14/15 2046 06/15/15 0439  Weight: 62.143 kg (137 lb) 62.2 kg (137 lb 2 oz) 62.2 kg (137 lb 2 oz)     Microbiology: No results found for this or any previous visit (from the past 240 hour(s)).     Blood Culture    Component Value Date/Time   SDES URINE, CATHETERIZED 06/14/2015 1638   SPECREQUEST NONE 06/14/2015 1638   CULT >=100,000 COLONIES/mL ESCHERICHIA COLI 06/14/2015 1638   REPTSTATUS 06/16/2015 FINAL 06/14/2015 1638      Labs: No results found for this or any previous visit (from the past 48 hour(s)).   Lipid Panel     Component Value Date/Time   CHOL 172 07/02/2010 2140   TRIG 224* 07/02/2010 2140   HDL 41 07/02/2010 2140   CHOLHDL 4.2 Ratio 07/02/2010 2140   VLDL 45* 07/02/2010 2140   LDLCALC 86 07/02/2010 2140     Lab Results  Component Value Date   HGBA1C 5.9* 06/14/2015   HGBA1C 8.4* 01/22/2015   HGBA1C 8.5* 04/07/2014     Lab Results  Component Value Date   MICROALBUR 4.02* 11/07/2008   LDLCALC 86 07/02/2010   CREATININE 0.68 06/17/2015     HPI :  79 y.o. female, with rheumatoid arthritis, diabetes, and hypertension who presents to the emergency department today after being found down. Mrs. Mafi reports that she has fallen 4 times in the past 24 hours. She complains of eating very little for the past 2 months and weight loss. She denies abdominal pain and reports that her niece brings her food daily but she eats very little. She has had problems with dizziness and falling but they have, much worse in the last 24-48 hours. Today she was unable to get up and answer the door. She is currently unable to walk. She tells me that she had a bladder infection and a yeast infection 1 month ago and was treated for these by her primary care physician. Today, she complains of having urgency and frequency with very little urine output.  In the emergency department the patient becomes dyspneic and her oxygen saturation drops to 76 while she is  speaking to me. Her chest x-ray is negative for acute abnormality, her d-dimer was elevated but CTA chest was negative for PE. White count is slightly elevated. She is orthostatic with her systolic blood pressure dropping from 149 to 115. The patient appears to have failure to thrive  HOSPITAL COURSE:   Acute respiratory failure with hypoxia Oxygen saturation drops to mid 70s when patient is speaking. Currently on 96% on room air at rest, CT  scan negative for PE/pneumonia 2-D echo LV EF: 60% -  65% D-dimer was elevated, but CTA chest was negative for PE. venous Doppler to rule out DVT negative Continue PRN, albuterol nebs prn, flutter valve.     Escherichia coli urinary tract infection Urine culture from 11/16 positive for greater than 100,000 colonies Patient started on Rocephin, subsequently switched to ciprofloxacin will   Orthostasis with multiple falls Patients SBP dropped from 150 to 115 in the ED with change of position. Will decrease metoprolol by half, continue IV hydration. Patient still somewhat hypotensive with systolic in the 0000000, likely the setting of her UTI MRI of the C-spine shows widespread cervical spine degeneration Foraminal stenosis at multiple levels , however no focal cord compression , PT/OT evaluation recommended SNF, patient declines. Normal TSH. Patient could also have adrenal insufficiency in the setting of hypokalemia? Cortisol level is 7.1 patient started on hydrocortisone stress dose switched to by mouth hydrocortisone tomorrow   Rheumatoid Arthritis Patient was switched from methotrexate to Plaquenil just 2 weeks ago. She takes steroids daily.  Continue Medrol 2 mg in the morning Started the patient on stress dose steroids in the setting of UTI   Diabetes mellitus. Stable Patient has a cbg of 181 in the ER. She reports having an episode of hypoglycemia last Thursday when her CBG was 21. Continue sliding scale insulin   Weight  loss Documented weight loss shows she has decreased 10 lbs in 3 months. The patient complains of having lost 35 lbs in the past year. Will request a nutrition consultation and order ensure   Discharge Exam:    Blood pressure 153/68, pulse 81, temperature 98.4 F (36.9 C), temperature source Oral, resp. rate 16, height 5\' 5"  (1.651 m), weight 62.2 kg (137 lb 2 oz), SpO2 99 %.  General: Thin, frail, elderly lying in bed in NAD, niece at bedside  Psych: Normal affect and insight, Not Suicidal or Homicidal, Awake Alert, Oriented X 3.  Neuro: Grossly normal. No acute focal neuro deficits.  ENT: Ears and Eyes appear Normal, Conjunctivae clear, PER. Moist oral mucosa without erythema or exudates. Poor dentition  Neck: Supple, No lymphadenopathy appreciated  Respiratory: Symmetrical chest wall movement, Good air movement bilaterally, CTAB.  Cardiac: RRR, No Murmurs, no LE edema noted, no JVD.   Abdomen: Positive bowel sounds, Soft, Non tender, Non distended, No masses appreciated, + right CVA tenderness  Skin: No Cyanosis, Normal Skin Turgor, No Skin Rash or Bruise.       SignedReyne Dumas 07/03/2015, 2:05 PM        Time spent >45 mins

## 2015-07-06 DIAGNOSIS — E119 Type 2 diabetes mellitus without complications: Secondary | ICD-10-CM | POA: Diagnosis not present

## 2015-07-06 DIAGNOSIS — J449 Chronic obstructive pulmonary disease, unspecified: Secondary | ICD-10-CM | POA: Diagnosis not present

## 2015-07-06 DIAGNOSIS — K219 Gastro-esophageal reflux disease without esophagitis: Secondary | ICD-10-CM | POA: Diagnosis not present

## 2015-07-06 DIAGNOSIS — M545 Low back pain: Secondary | ICD-10-CM | POA: Diagnosis not present

## 2015-07-06 DIAGNOSIS — I10 Essential (primary) hypertension: Secondary | ICD-10-CM | POA: Diagnosis not present

## 2015-07-09 DIAGNOSIS — J440 Chronic obstructive pulmonary disease with acute lower respiratory infection: Secondary | ICD-10-CM | POA: Diagnosis not present

## 2015-07-18 DIAGNOSIS — J440 Chronic obstructive pulmonary disease with acute lower respiratory infection: Secondary | ICD-10-CM | POA: Diagnosis not present

## 2015-07-19 DIAGNOSIS — J449 Chronic obstructive pulmonary disease, unspecified: Secondary | ICD-10-CM | POA: Diagnosis not present

## 2015-07-19 DIAGNOSIS — M069 Rheumatoid arthritis, unspecified: Secondary | ICD-10-CM | POA: Diagnosis not present

## 2015-10-06 ENCOUNTER — Encounter (HOSPITAL_COMMUNITY): Payer: Self-pay | Admitting: Emergency Medicine

## 2015-10-06 ENCOUNTER — Emergency Department (HOSPITAL_COMMUNITY)
Admission: EM | Admit: 2015-10-06 | Discharge: 2015-10-06 | Disposition: A | Discharge status: Home / Self Care | Payer: Medicare Other | Attending: Emergency Medicine | Admitting: Emergency Medicine

## 2015-10-06 ENCOUNTER — Emergency Department (HOSPITAL_COMMUNITY): Payer: Medicare Other

## 2015-10-06 DIAGNOSIS — M1990 Unspecified osteoarthritis, unspecified site: Secondary | ICD-10-CM | POA: Insufficient documentation

## 2015-10-06 DIAGNOSIS — Z9981 Dependence on supplemental oxygen: Secondary | ICD-10-CM | POA: Insufficient documentation

## 2015-10-06 DIAGNOSIS — Z7952 Long term (current) use of systemic steroids: Secondary | ICD-10-CM | POA: Insufficient documentation

## 2015-10-06 DIAGNOSIS — Z79899 Other long term (current) drug therapy: Secondary | ICD-10-CM | POA: Diagnosis not present

## 2015-10-06 DIAGNOSIS — D649 Anemia, unspecified: Secondary | ICD-10-CM | POA: Insufficient documentation

## 2015-10-06 DIAGNOSIS — E7800 Pure hypercholesterolemia, unspecified: Secondary | ICD-10-CM | POA: Insufficient documentation

## 2015-10-06 DIAGNOSIS — R079 Chest pain, unspecified: Secondary | ICD-10-CM | POA: Diagnosis not present

## 2015-10-06 DIAGNOSIS — I10 Essential (primary) hypertension: Secondary | ICD-10-CM | POA: Diagnosis not present

## 2015-10-06 DIAGNOSIS — R0600 Dyspnea, unspecified: Secondary | ICD-10-CM | POA: Insufficient documentation

## 2015-10-06 DIAGNOSIS — R0602 Shortness of breath: Secondary | ICD-10-CM | POA: Diagnosis present

## 2015-10-06 DIAGNOSIS — E119 Type 2 diabetes mellitus without complications: Secondary | ICD-10-CM | POA: Insufficient documentation

## 2015-10-06 DIAGNOSIS — R21 Rash and other nonspecific skin eruption: Secondary | ICD-10-CM | POA: Insufficient documentation

## 2015-10-06 DIAGNOSIS — Z7982 Long term (current) use of aspirin: Secondary | ICD-10-CM | POA: Diagnosis not present

## 2015-10-06 DIAGNOSIS — R42 Dizziness and giddiness: Secondary | ICD-10-CM | POA: Diagnosis not present

## 2015-10-06 DIAGNOSIS — G4733 Obstructive sleep apnea (adult) (pediatric): Secondary | ICD-10-CM | POA: Insufficient documentation

## 2015-10-06 DIAGNOSIS — Z7951 Long term (current) use of inhaled steroids: Secondary | ICD-10-CM | POA: Diagnosis not present

## 2015-10-06 DIAGNOSIS — Z8719 Personal history of other diseases of the digestive system: Secondary | ICD-10-CM | POA: Diagnosis not present

## 2015-10-06 DIAGNOSIS — Z794 Long term (current) use of insulin: Secondary | ICD-10-CM | POA: Diagnosis not present

## 2015-10-06 DIAGNOSIS — Z87440 Personal history of urinary (tract) infections: Secondary | ICD-10-CM | POA: Insufficient documentation

## 2015-10-06 LAB — BASIC METABOLIC PANEL
ANION GAP: 15 (ref 5–15)
BUN: 7 mg/dL (ref 6–20)
CALCIUM: 9.5 mg/dL (ref 8.9–10.3)
CHLORIDE: 101 mmol/L (ref 101–111)
CO2: 26 mmol/L (ref 22–32)
Creatinine, Ser: 1.14 mg/dL — ABNORMAL HIGH (ref 0.44–1.00)
GFR calc non Af Amer: 45 mL/min — ABNORMAL LOW (ref >60)
GFR, EST AFRICAN AMERICAN: 52 mL/min — AB (ref >60)
Glucose, Bld: 157 mg/dL — ABNORMAL HIGH (ref 65–99)
Potassium: 3 mmol/L — ABNORMAL LOW (ref 3.5–5.1)
SODIUM: 142 mmol/L (ref 135–145)

## 2015-10-06 LAB — CBC
HCT: 40.4 % (ref 36.0–46.0)
HEMOGLOBIN: 12.5 g/dL (ref 12.0–15.0)
MCH: 27.5 pg (ref 26.0–34.0)
MCHC: 30.9 g/dL (ref 30.0–36.0)
MCV: 88.8 fL (ref 78.0–100.0)
Platelets: 247 10*3/uL (ref 150–400)
RBC: 4.55 MIL/uL (ref 3.87–5.11)
RDW: 15.5 % (ref 11.5–15.5)
WBC: 9.8 10*3/uL (ref 4.0–10.5)

## 2015-10-06 LAB — I-STAT TROPONIN, ED: TROPONIN I, POC: 0 ng/mL (ref 0.00–0.08)

## 2015-10-06 MED ORDER — DEXAMETHASONE SODIUM PHOSPHATE 10 MG/ML IJ SOLN: 10.0000 mg | Freq: Once | INTRAMUSCULAR | Status: DC

## 2015-10-06 MED ORDER — HYDROXYZINE HCL 25 MG PO TABS: 25.0000 mg | ORAL_TABLET | Freq: Four times a day (QID) | ORAL | Status: DC

## 2015-10-06 MED ORDER — DEXAMETHASONE SODIUM PHOSPHATE 10 MG/ML IJ SOLN
10.0000 mg | Freq: Once | INTRAMUSCULAR | Status: AC
  Administered 2015-10-06: 10 mg via INTRAMUSCULAR
  Filled 2015-10-06: qty 1

## 2015-10-06 NOTE — ED Notes (Signed)
Pt ambulatory with pulse ox. Steady gait noted, oxygen sats remained in 97%. Pt denies shortness of breath or dizziness during ambulation

## 2015-10-06 NOTE — ED Notes (Signed)
Pt c/o sob, and chest discomfort with dizziness that started earlier today.

## 2015-10-06 NOTE — Discharge Instructions (Signed)
Return to the emergency department if you develop severe chest pain, worsening breathing, or other new or concerning problems.   Shortness of Breath Shortness of breath means you have trouble breathing. It could also mean that you have a medical problem. You should get immediate medical care for shortness of breath. CAUSES   Not enough oxygen in the air such as with high altitudes or a smoke-filled room.  Certain lung diseases, infections, or problems.  Heart disease or conditions, such as angina or heart failure.  Low red blood cells (anemia).  Poor physical fitness, which can cause shortness of breath when you exercise.  Chest or back injuries or stiffness.  Being overweight.  Smoking.  Anxiety, which can make you feel like you are not getting enough air. DIAGNOSIS  Serious medical problems can often be found during your physical exam. Tests may also be done to determine why you are having shortness of breath. Tests may include:  Chest X-rays.  Lung function tests.  Blood tests.  An electrocardiogram (ECG).  An ambulatory electrocardiogram. An ambulatory ECG records your heartbeat patterns over a 24-hour period.  Exercise testing.  A transthoracic echocardiogram (TTE). During echocardiography, sound waves are used to evaluate how blood flows through your heart.  A transesophageal echocardiogram (TEE).  Imaging scans. Your health care provider may not be able to find a cause for your shortness of breath after your exam. In this case, it is important to have a follow-up exam with your health care provider as directed.  TREATMENT  Treatment for shortness of breath depends on the cause of your symptoms and can vary greatly. HOME CARE INSTRUCTIONS   Do not smoke. Smoking is a common cause of shortness of breath. If you smoke, ask for help to quit.  Avoid being around chemicals or things that may bother your breathing, such as paint fumes and dust.  Rest as needed.  Slowly resume your usual activities.  If medicines were prescribed, take them as directed for the full length of time directed. This includes oxygen and any inhaled medicines.  Keep all follow-up appointments as directed by your health care provider. SEEK MEDICAL CARE IF:   Your condition does not improve in the time expected.  You have a hard time doing your normal activities even with rest.  You have any new symptoms. SEEK IMMEDIATE MEDICAL CARE IF:   Your shortness of breath gets worse.  You feel light-headed, faint, or develop a cough not controlled with medicines.  You start coughing up blood.  You have pain with breathing.  You have chest pain or pain in your arms, shoulders, or abdomen.  You have a fever.  You are unable to walk up stairs or exercise the way you normally do. MAKE SURE YOU:  Understand these instructions.  Will watch your condition.  Will get help right away if you are not doing well or get worse.   This information is not intended to replace advice given to you by your health care provider. Make sure you discuss any questions you have with your health care provider.   Document Released: 04/09/2001 Document Revised: 07/20/2013 Document Reviewed: 09/30/2011 Elsevier Interactive Patient Education [employer].

## 2015-10-06 NOTE — ED Notes (Signed)
Pt requesting a shot for my "burning pain in my back." Verbal orders obtained from [place]

## 2015-10-06 NOTE — ED Provider Notes (Signed)
CSN: [id number]     Arrival date & time 10/06/15  0034 History   By signing my name below, I, Terrance Branch, attest that this documentation has been prepared under the direction and in the presence of Veryl Speak, MD. Electronically Signed: Randa Evens, ED Scribe. 10/06/2015. 3:02 AM.     Chief Complaint  Patient presents with  . Shortness of Breath   Patient is a 80 y.o. female presenting with shortness of breath. The history is provided by the patient. No language interpreter was used.  Shortness of Breath Associated symptoms: chest pain and rash    HPI Comments: Gursimran Theall is a 80 y.o. female who presents to the Emergency Department complaining of CP and SOB onset today. Pt states she has tried her at home nebulizer with no relief. Pt reports having similar symptoms when she was admitted in November 2016.  Reports itchy rash that began about 1 week prior. Pt states that the rash is located on her legs, shoulder and back. Pt states she has been using an ointment with no relief of the rash. Pt denies leg swelling or other related symptoms.   Past Medical History  Diagnosis Date  . Hypertension   . Chronic bronchitis ([place])     "get it just about q yr" (06/14/2015)  . Anginal pain ([place])   . Exertional shortness of breath   . High cholesterol   . Type II diabetes mellitus ([place])   . GERD (gastroesophageal reflux disease)   . OSA (obstructive sleep apnea)     "I think I'm using a BiPAP" (06/14/2015)  . Anemia     "when I was young"  . History of blood transfusion ~ 1954    "related to my periods"  . Headache(784.0)     "probably every other day " (06/14/2015)  . Arthritis     "all over my body"   . Frequent UTI     "recently" (06/14/2015)   Past Surgical History  Procedure Laterality Date  . Back surgery    . Appendectomy  1969  . Lumbar disc surgery      "slipped disc" (12/11/2012)  . Orif shoulder fracture Left ~ 2009    "put cement down in it"   . Vaginal  hysterectomy  ~ 1973  . Ectopic pregnancy surgery  1969  . Laparoscopic cholecystectomy  1990's  . Tonsillectomy  1950's?  . Fracture surgery     Family History  Problem Relation Age of Onset  . Hypertension Mother   . Heart attack Father   . Diabetes Mellitus II Sister   . Hypertension Sister   . Cancer - Other Sister    Social History  Substance Use Topics  . Smoking status: Never Smoker   . Smokeless tobacco: Never Used     Comment: 04/07/2014"1 pack of cigarettes would last 3-4 months; haven't had any cigarettes for 40 years"  . Alcohol Use: Yes     Comment: "used to drink beer in the 1990's every once in awhile"   OB History    No data available     Review of Systems  Respiratory: Positive for shortness of breath.   Cardiovascular: Positive for chest pain. Negative for leg swelling.  Skin: Positive for rash.      Allergies  Review of patient's allergies indicates no known allergies.  Home Medications   Prior to Admission medications   Medication Sig Start Date End Date Taking? Authorizing Provider  albuterol (PROVENTIL HFA;VENTOLIN HFA) 108 (90  BASE) MCG/ACT inhaler Inhale 2 puffs into the lungs every 6 (six) hours as needed for wheezing or shortness of breath.    Historical Provider, MD  Alum & Mag Hydroxide-Simeth (MAGIC MOUTHWASH W/LIDOCAINE) SOLN Take 5 mLs by mouth 3 (three) times daily as needed for mouth pain. 03/14/15   Kaitlyn Szekalski, PA-C  aspirin EC 81 MG tablet Take 1 tablet (81 mg total) by mouth daily. 04/09/14   Debbe Odea, MD  atorvastatin (LIPITOR) 20 MG tablet Take 20 mg by mouth every morning.  01/11/15   Historical Provider, MD  docusate sodium (COLACE) 100 MG capsule Take 1 capsule (100 mg total) by mouth 2 (two) times daily. 06/17/15   Reyne Dumas, MD  feeding supplement, ENSURE ENLIVE, (ENSURE ENLIVE) LIQD Take 237 mLs by mouth 2 (two) times daily between meals. 06/17/15   Reyne Dumas, MD  fluticasone (FLONASE) 50 MCG/ACT nasal spray  instill 2 sprays into each nostril once daily 01/05/15   Historical Provider, MD  folic acid (FOLVITE) 1 MG tablet Take 1 mg by mouth daily.    Historical Provider, MD  hydrocortisone (CORTEF) 10 MG tablet Take 1 tablet (10 mg total) by mouth 2 (two) times daily. 06/17/15   Reyne Dumas, MD  hydroxychloroquine (PLAQUENIL) 200 MG tablet Take 200 mg by mouth 2 (two) times daily. 05/15/15   Historical Provider, MD  insulin glargine (LANTUS) 100 unit/mL SOPN Inject 0.05 mLs (5 Units total) into the skin daily at 10 pm. 01/24/15   Silver Huguenin Elgergawy, MD  JANUVIA 100 MG tablet Take 100 mg by mouth daily.  01/11/15   Historical Provider, MD  methylPREDNISolone (MEDROL) 4 MG tablet Take 2 mg by mouth every morning. 05/21/15   Historical Provider, MD  metoprolol tartrate (LOPRESSOR) 25 MG tablet Take 0.5 tablets (12.5 mg total) by mouth 2 (two) times daily. 06/17/15   Reyne Dumas, MD  SUMAtriptan (IMITREX) 50 MG tablet Take 1 tablet (50 mg total) by mouth every 2 (two) hours as needed for migraine. 12/15/12   Verlee Monte, MD  traMADol (ULTRAM) 50 MG tablet Take 50 mg by mouth 2 (two) times daily as needed for pain.  05/04/12   Barbara Cower, MD  traZODone (DESYREL) 100 MG tablet Take 1 tablet (100 mg total) by mouth at bedtime as needed for sleep. 06/17/15   Reyne Dumas, MD   BP 135/90 mmHg  Pulse 113  Temp(Src) 97.4 F (36.3 C) (Oral)  Resp 22  SpO2 97%   Physical Exam  Constitutional: She is oriented to person, place, and time. She appears well-developed and well-nourished. No distress.  HENT:  Head: Normocephalic and atraumatic.  Mouth/Throat: Oropharynx is clear and moist. No oropharyngeal exudate.  Eyes: Conjunctivae and EOM are normal. Pupils are equal, round, and reactive to light.  Neck: Normal range of motion. Neck supple.  No meningismus.  Cardiovascular: Normal rate, regular rhythm, normal heart sounds and intact distal pulses.   No murmur heard. Pulmonary/Chest: Effort normal and  breath sounds normal. No respiratory distress.  Abdominal: Soft. There is no tenderness. There is no rebound and no guarding.  Musculoskeletal: Normal range of motion. She exhibits no edema or tenderness.  Neurological: She is alert and oriented to person, place, and time. No cranial nerve deficit. She exhibits normal muscle tone. Coordination normal.  No ataxia on finger to nose bilaterally. No pronator drift. 5/5 strength throughout. CN 2-12 intact.Equal grip strength. Sensation intact.   Skin: Skin is warm.  Psychiatric: She has a normal mood and  affect. Her behavior is normal.  Nursing note and vitals reviewed.   ED Course  Procedures (including critical care time) DIAGNOSTIC STUDIES: Oxygen Saturation is 97% on RA, normal by my interpretation.    COORDINATION OF CARE: 2:56 AM-Discussed treatment plan with pt at bedside and pt agreed to plan.     Labs Review Labs Reviewed  BASIC METABOLIC PANEL - Abnormal; Notable for the following:    Potassium 3.0 (*)    Glucose, Bld 157 (*)    Creatinine, Ser 1.14 (*)    GFR calc non Af Amer 45 (*)    GFR calc Af Amer 52 (*)    All other components within normal limits  CBC  I-STAT TROPOININ, ED    Imaging Review Dg Chest 2 View  10/06/2015  CLINICAL DATA:  Mid chest pain radiating down the left arm today. Shortness of breath. Hypertension. Diabetes. Nonsmoker. EXAM: CHEST  2 VIEW COMPARISON:  06/16/2015 FINDINGS: Normal heart size and pulmonary vascularity. No focal airspace disease or consolidation in the lungs. No blunting of costophrenic angles. No pneumothorax. Mediastinal contours appear intact. Degenerative changes in the spine and shoulders. Previous kyphoplasty changes. Surgical clips in the right upper quadrant. Tortuous aorta. IMPRESSION: No active cardiopulmonary disease. Electronically Signed   By: Lucienne Capers M.D.   On: 10/06/2015 01:22   I have personally reviewed and evaluated these images and lab results as part of my  medical decision-making.   EKG Interpretation   Date/Time:  Friday October 06 2015 00:41:19 EST Ventricular Rate:  114 PR Interval:  138 QRS Duration: 88 QT Interval:  360 QTC Calculation: 496 R Axis:   -35 Text Interpretation:  Sinus tachycardia Left axis deviation T wave  abnormality, consider lateral ischemia Abnormal ECG Confirmed by Xianna Siverling  MD,  Stark Aguinaga ([id number]) on 10/06/2015 4:22:33 AM      MDM   Final diagnoses:  None      Patient is a 80 year old female who presents with complaints of shortness of breath and chest tightness along with dizziness that has been present throughout the day. She denies any nausea or diaphoresis. She denies any radiation to the arm or jaw. Her symptoms are very atypical for cardiac pain. Her troponin is negative and EKG is normal. She was ambulated in the emergency department and oxygen saturations remained in the upper 90s. I see no reason for admission or further workup at this time. She will be discharged with instructions to follow-up with her primary doctor.   I personally performed the services described in this documentation, which was scribed in my presence. The recorded information has been reviewed and is accurate.       Veryl Speak, MD 10/06/15 [phone]

## 2016-04-15 ENCOUNTER — Encounter (HOSPITAL_COMMUNITY): Payer: Self-pay | Admitting: Emergency Medicine

## 2016-04-15 ENCOUNTER — Emergency Department (HOSPITAL_COMMUNITY)
Admission: EM | Admit: 2016-04-15 | Discharge: 2016-04-15 | Disposition: A | Discharge status: Home / Self Care | Payer: Medicare Other | Attending: Emergency Medicine | Admitting: Emergency Medicine

## 2016-04-15 DIAGNOSIS — Z7982 Long term (current) use of aspirin: Secondary | ICD-10-CM | POA: Diagnosis not present

## 2016-04-15 DIAGNOSIS — Z7984 Long term (current) use of oral hypoglycemic drugs: Secondary | ICD-10-CM | POA: Diagnosis not present

## 2016-04-15 DIAGNOSIS — I110 Hypertensive heart disease with heart failure: Secondary | ICD-10-CM | POA: Insufficient documentation

## 2016-04-15 DIAGNOSIS — I5032 Chronic diastolic (congestive) heart failure: Secondary | ICD-10-CM | POA: Diagnosis not present

## 2016-04-15 DIAGNOSIS — E119 Type 2 diabetes mellitus without complications: Secondary | ICD-10-CM | POA: Insufficient documentation

## 2016-04-15 DIAGNOSIS — Z794 Long term (current) use of insulin: Secondary | ICD-10-CM | POA: Diagnosis not present

## 2016-04-15 DIAGNOSIS — M545 Low back pain, unspecified: Secondary | ICD-10-CM

## 2016-04-15 DIAGNOSIS — Z79899 Other long term (current) drug therapy: Secondary | ICD-10-CM | POA: Insufficient documentation

## 2016-04-15 MED ORDER — OXYCODONE-ACETAMINOPHEN 5-325 MG PO TABS
ORAL_TABLET | ORAL | Status: AC
  Filled 2016-04-15: qty 1

## 2016-04-15 MED ORDER — BUPIVACAINE HCL (PF) 0.5 % IJ SOLN
10.0000 mL | Freq: Once | INTRAMUSCULAR | Status: DC
  Filled 2016-04-15: qty 10

## 2016-04-15 MED ORDER — OXYCODONE-ACETAMINOPHEN 5-325 MG PO TABS
1.0000 | ORAL_TABLET | ORAL | Status: AC | PRN
  Administered 2016-04-15 (×2): 1 via ORAL

## 2016-04-15 MED ORDER — OXYCODONE-ACETAMINOPHEN 5-325 MG PO TABS
ORAL_TABLET | ORAL | Status: DC
Start: 2016-04-15 — End: 2016-04-15
  Filled 2016-04-15: qty 1

## 2016-04-15 NOTE — ED Notes (Signed)
Pt crying at triage-- no relief from 1st [percocet

## 2016-04-15 NOTE — ED Triage Notes (Signed)
Pt has low back pain, for 2 months-- has had surgery 7-8 yrs ago on lumbar spine. Pain radiates to buttock and groin area-- hurts to walk.

## 2016-04-15 NOTE — ED Notes (Signed)
Spoke with pt regarding wait time.

## 2016-04-15 NOTE — ED Provider Notes (Signed)
[facility] DEPT Provider Note   CSN: [id number] Arrival date & time: 04/15/16  [id number]     History   Chief Complaint Chief Complaint  Patient presents with  . Abdominal Pain  . Flank Pain    HPI Cai Babauta is a 80 y.o. female.  The history is provided by the patient.  Back Pain   This is a recurrent problem. Episode onset: 2-3 days worsening from fall 1 month ago. The problem occurs constantly. The problem has been gradually worsening. The pain is associated with falling. The pain is present in the lumbar spine. The pain is moderate. The symptoms are aggravated by bending and twisting. The pain is the same all the time. Pertinent negatives include no fever, no weight loss, no abdominal pain, no bowel incontinence, no perianal numbness and no bladder incontinence. She has tried NSAIDs for the symptoms. The treatment provided no relief. Risk factors include lack of exercise.    Past Medical History:  Diagnosis Date  . Anemia    "when I was young"  . Anginal pain ([place])   . Arthritis    "all over my body"   . Chronic bronchitis ([place])    "get it just about q yr" (06/14/2015)  . Exertional shortness of breath   . Frequent UTI    "recently" (06/14/2015)  . GERD (gastroesophageal reflux disease)   . Headache(784.0)    "probably every other day " (06/14/2015)  . High cholesterol   . History of blood transfusion ~ 1954   "related to my periods"  . Hypertension   . OSA (obstructive sleep apnea)    "I think I'm using a BiPAP" (06/14/2015)  . Type II diabetes mellitus [facility])     Patient Active Problem List   Diagnosis Date Noted  . Multiple falls 06/14/2015  . Weight loss 06/14/2015  . Acute respiratory failure with hypoxia ([place]) 06/14/2015  . Orthostasis 06/14/2015  . Acute respiratory failure ([place]) 06/14/2015  . Elevated lactic acid level 01/22/2015  . 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 Chronic diastolic heart failure, NYHA class 1 ([place]) 01/22/2015  . Tachycardia  with 100 - 120 beats per minute 04/08/2014  . DM (diabetes mellitus) ([place]) 04/08/2014  . Chest pain 04/07/2014  . Hyperglycemia 12/11/2012  . Syncope and collapse 12/11/2012  . Dehydration, moderate 12/11/2012  . Noncompliance 12/11/2012  . UTI (lower urinary tract infection) 12/11/2012  . Chronic steroid use 12/11/2012  . Rheumatoid arthritis ([place]) 12/11/2012    Past Surgical History:  Procedure Laterality Date  . APPENDECTOMY  1969  . BACK SURGERY    . [place]  . FRACTURE SURGERY    . LAPAROSCOPIC CHOLECYSTECTOMY  1990's  . LUMBAR DISC SURGERY     "slipped disc" (12/11/2012)  . ORIF SHOULDER FRACTURE Left ~ 2009   "put cement down in it"   . TONSILLECTOMY  1950's?  [facility] VAGINAL HYSTERECTOMY  ~ 1973    OB History    No data available       Home Medications    Prior to Admission medications   Medication Sig Start Date End Date Taking? Authorizing Provider  Alum & Mag Hydroxide-Simeth (MAGIC MOUTHWASH W/LIDOCAINE) SOLN Take 5 mLs by mouth 3 (three) times daily as needed for mouth pain. Patient not taking: Reported on 10/06/2015 03/14/15   Alvina Chou, PA-C  aspirin EC 81 MG tablet Take 1 tablet (81 mg total) by mouth daily. 04/09/14   Debbe Odea, MD  atorvastatin (LIPITOR) 20 MG tablet  Take 20 mg by mouth every morning.  01/11/15   Historical Provider, MD  diazepam (VALIUM) 5 MG tablet Take 5 mg by mouth daily as needed for anxiety.    Historical Provider, MD  docusate sodium (COLACE) 100 MG capsule Take 1 capsule (100 mg total) by mouth 2 (two) times daily. Patient not taking: Reported on 10/06/2015 06/17/15   Reyne Dumas, MD  feeding supplement, ENSURE ENLIVE, (ENSURE ENLIVE) LIQD Take 237 mLs by mouth 2 (two) times daily between meals. Patient not taking: Reported on 10/06/2015 06/17/15   Reyne Dumas, MD  hydrocortisone (CORTEF) 10 MG tablet Take 1 tablet (10 mg total) by mouth 2 (two) times daily. Patient not taking: Reported on 10/06/2015  06/17/15   Reyne Dumas, MD  hydroxychloroquine (PLAQUENIL) 200 MG tablet Take 200 mg by mouth 2 (two) times daily. 05/15/15   Historical Provider, MD  hydrOXYzine (ATARAX/VISTARIL) 25 MG tablet Take 1 tablet (25 mg total) by mouth every 6 (six) hours. 10/06/15   Veryl Speak, MD  insulin glargine (LANTUS) 100 unit/mL SOPN Inject 0.05 mLs (5 Units total) into the skin daily at 10 pm. Patient not taking: Reported on 10/06/2015 01/24/15   Silver Huguenin Elgergawy, MD  JANUVIA 100 MG tablet Take 100 mg by mouth daily.  01/11/15   Historical Provider, MD  methylPREDNISolone (MEDROL) 4 MG tablet Take 2-4 mg by mouth every morning.  05/21/15   Historical Provider, MD  metoprolol tartrate (LOPRESSOR) 25 MG tablet Take 0.5 tablets (12.5 mg total) by mouth 2 (two) times daily. Patient not taking: Reported on 10/06/2015 06/17/15   Reyne Dumas, MD  SUMAtriptan (IMITREX) 50 MG tablet Take 1 tablet (50 mg total) by mouth every 2 (two) hours as needed for migraine. Patient not taking: Reported on 10/06/2015 12/15/12   Verlee Monte, MD  traMADol (ULTRAM) 50 MG tablet Take 50 mg by mouth 2 (two) times daily as needed for pain.  05/04/12   Barbara Cower, MD  traZODone (DESYREL) 100 MG tablet Take 1 tablet (100 mg total) by mouth at bedtime as needed for sleep. Patient not taking: Reported on 10/06/2015 06/17/15   Reyne Dumas, MD    Family History Family History  Problem Relation Age of Onset  . Hypertension Mother   . Heart attack Father   . Diabetes Mellitus II Sister   . Hypertension Sister   . Cancer - Other Sister     Social History Social History  Substance Use Topics  . Smoking status: Never Smoker  . Smokeless tobacco: Never Used     Comment: 04/07/2014"1 pack of cigarettes would last 3-4 months; haven't had any cigarettes for 40 years"  . Alcohol use Yes     Comment: "used to drink beer in the 1990's every once in awhile"     Allergies   Review of patient's allergies indicates no known  allergies.   Review of Systems Review of Systems  Constitutional: Negative for fever and weight loss.  Gastrointestinal: Negative for abdominal pain and bowel incontinence.  Genitourinary: Negative for bladder incontinence.  Musculoskeletal: Positive for back pain.  All other systems reviewed and are negative.    Physical Exam Updated Vital Signs BP 137/78   Pulse 91   Temp 98 F (36.7 C) (Oral)   Resp 16   Ht 5\' 4"  (1.626 m)   Wt 135 lb (61.2 kg)   SpO2 95%   BMI 23.17 kg/m   Physical Exam  Constitutional: She is oriented to person, place, and time. She  appears well-developed and well-nourished. No distress.  HENT:  Head: Normocephalic.  Eyes: Conjunctivae are normal.  Neck: Neck supple. No tracheal deviation present.  Cardiovascular: Normal rate and regular rhythm.   Pulmonary/Chest: Effort normal. No respiratory distress.  Abdominal: Soft. She exhibits no distension.  Musculoskeletal:       Lumbar back: She exhibits tenderness and pain. She exhibits normal range of motion, no deformity and no spasm.       Back:  Neurological: She is alert and oriented to person, place, and time. She has normal strength. Coordination normal.  Skin: Skin is warm and dry.  Psychiatric: She has a normal mood and affect.     ED Treatments / Results  Labs (all labs ordered are listed, but only abnormal results are displayed) Labs Reviewed - No data to display  EKG  EKG Interpretation None       Radiology No results found.  Procedures Procedures (including critical care time)  Procedure Note: Trigger Point Injection for Myofascial pain  Performed by Dr. Laneta Simmers Indication: muscle/myofascial pain Muscle body and tendon sheath of the left lumbar paraspinal and superior gluteus muscle(s) were injected with 0.5% bupivacaine under sterile technique for release of muscle spasm/pain. Patient tolerated well with immediate improvement of symptoms and no immediate complications  following procedure.  CPT Code:   1 or 2 muscle bodies: 20552   Medications Ordered in ED Medications  oxyCODONE-acetaminophen (PERCOCET/ROXICET) 5-325 MG per tablet 1 tablet (1 tablet Oral Given 04/15/16 1251)     Initial Impression / Assessment and Plan / ED Course  I have reviewed the triage vital signs and the nursing notes.  Pertinent labs & imaging results that were available during my care of the patient were reviewed by me and considered in my medical decision making (see chart for details).  Clinical Course    80 y.o. female presents with back pain in lumbar area without signs of radicular pain. [place] a month ago and had pain since but worsened over last 2-3 days.No red flag symptoms of fever, weight loss, saddle anesthesia, weakness, fecal/urinary incontinence or urinary retention. Offered local trigger point injection for symptomatic treatment. Suspect MSK etiology. No indication for imaging emergently. Patient was recommended to take short course of scheduled NSAIDs and engage in early mobility as definitive treatment. Return precautions discussed for worsening or new concerning symptoms.    Final Clinical Impressions(s) / ED Diagnoses   Final diagnoses:  Left-sided low back pain without sciatica    New Prescriptions Discharge Medication List as of 04/15/2016  4:42 PM       Leo Grosser, MD 04/16/16 0157

## 2016-04-15 NOTE — ED Notes (Signed)
Pt. Verbalizes understanding of D/C papers and verbalizes intent to follow up. Going home with niece and has decreased pain

## 2016-08-05 IMAGING — MR MR HEAD W/O CM
9 of 10 series · 38 of 48 positions shown · non-contrast
Comparison: 04/09/2014

CLINICAL DATA: Ataxia.  Acute onset dizziness.

EXAM:
MRI HEAD WITHOUT CONTRAST
TECHNIQUE: Multiplanar, multiecho pulse sequences of the brain and surrounding
structures were obtained without intravenous contrast.

[Series 3: DWI · axial · 3.0mm · 1.09mm/px · z∈[-58,+83]mm · 11 of 98 slices shown (1 of 4)]
[im 1/98]
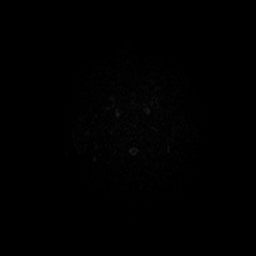
[im 10/98]
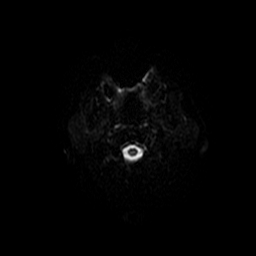
[im 20/98]
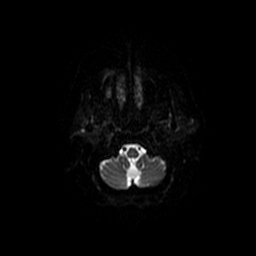
[im 30/98]
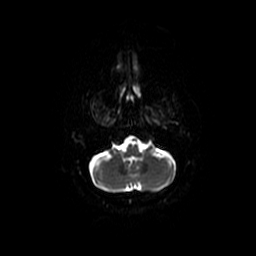
[im 39/98]
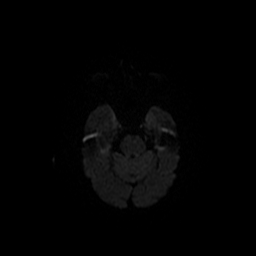
[im 49/98]
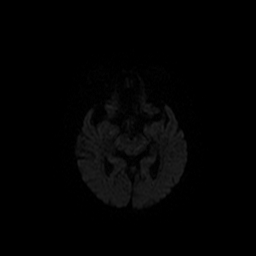
[im 59/98]
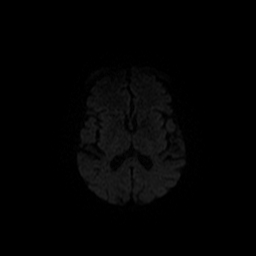
[im 68/98]
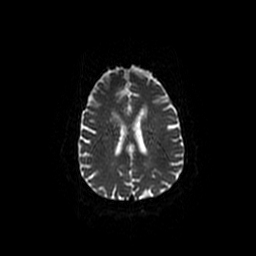
[im 78/98]
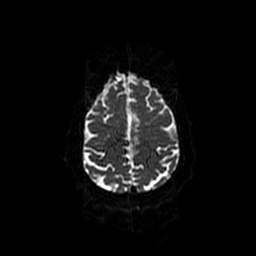
[im 88/98]
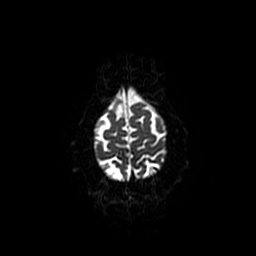
[im 98/98]
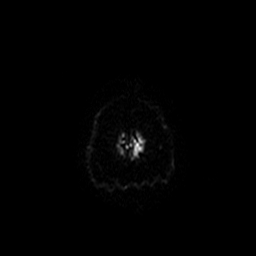

[Series 4: T2 · axial · 5.0mm · 0.43mm/px · z∈[-39,+97]mm · 2 of 24 slices shown (1 of 2)]
[im 1/24]
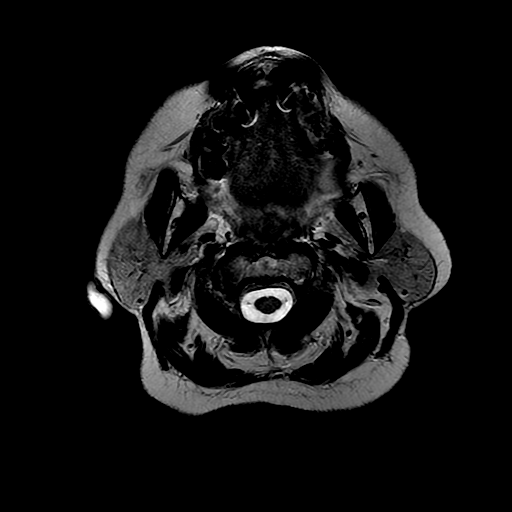
[im 24/24]
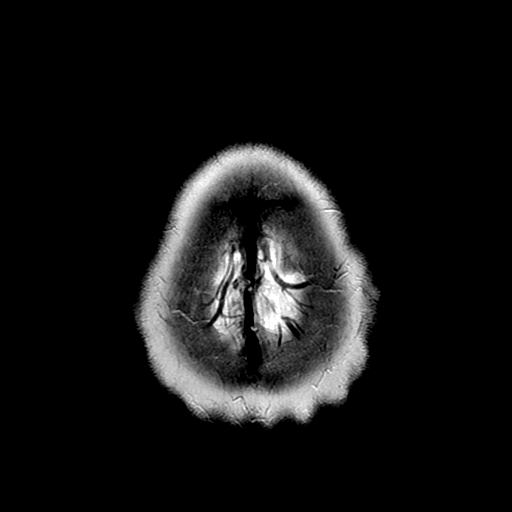

[Series 5: FLAIR · axial · 5.0mm · 0.43mm/px · z∈[-39,+97]mm · 2 of 24 slices shown]
[im 1/24]
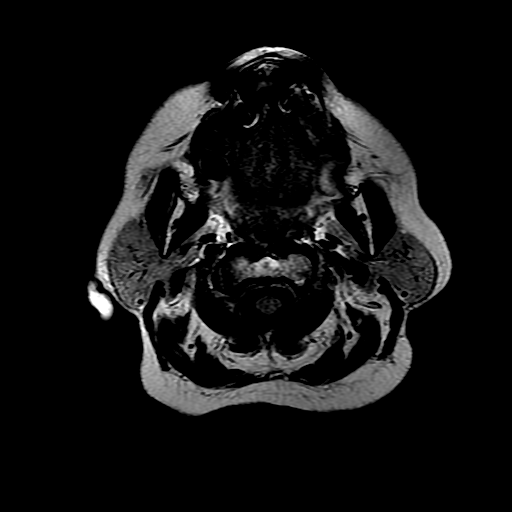
[im 24/24]
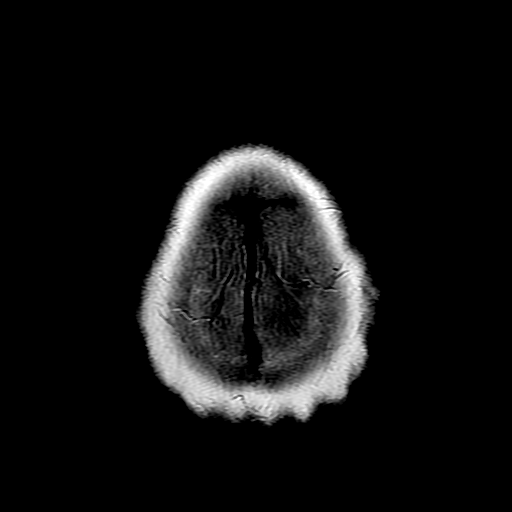

[Series 6: T1 · sagittal · 5.0mm · 0.47mm/px · 2 of 23 slices shown]
[im 1/23]
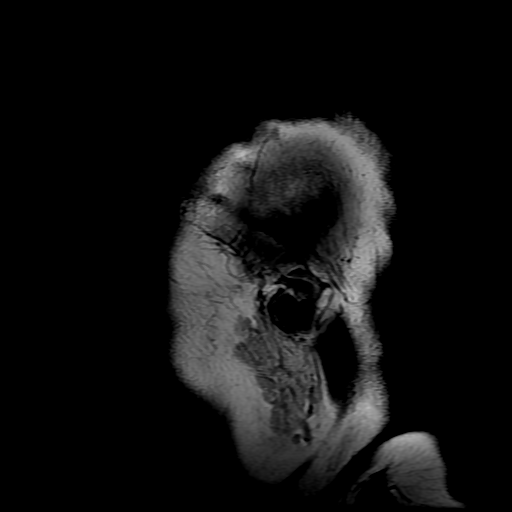
[im 23/23]
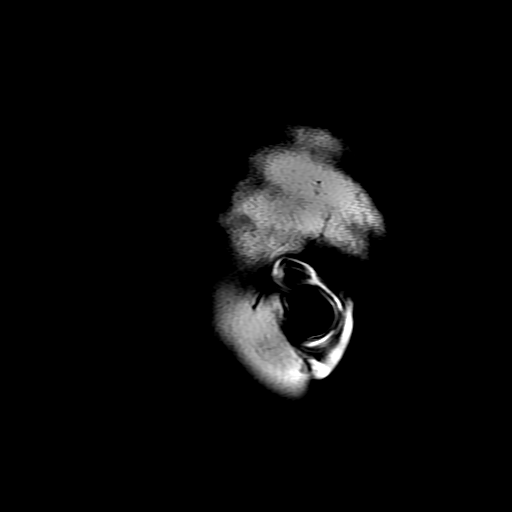

[Series 7: ax mpgr · axial · 5.0mm · 0.43mm/px · z∈[-39,+97]mm · 2 of 24 slices shown]
[im 1/24]
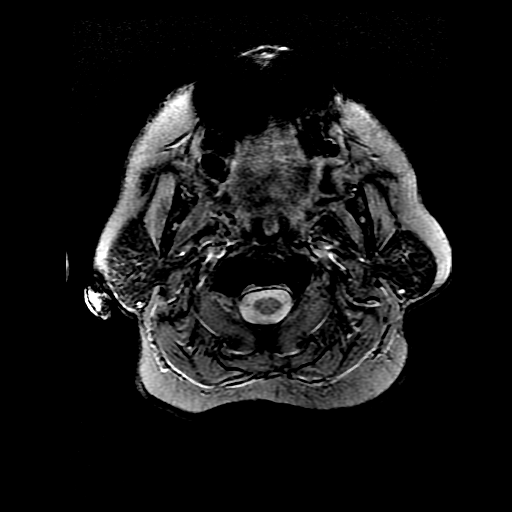
[im 24/24]
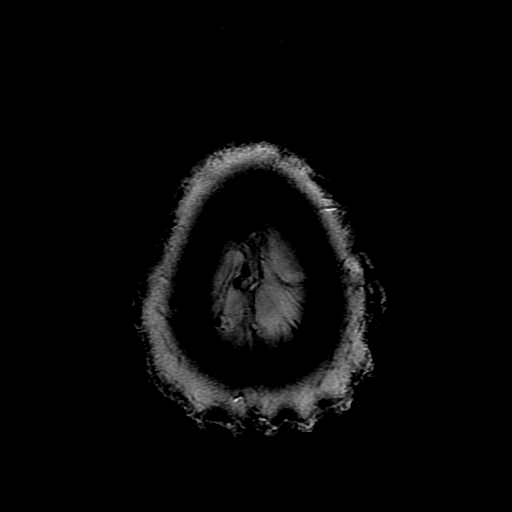

[Series 9: DWI · coronal · 5.0mm · 1.09mm/px · 7 of 72 slices shown (2 of 4)]
[im 1/72]
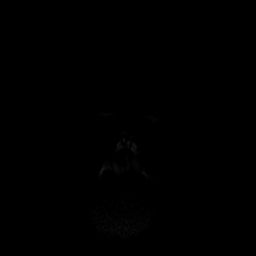
[im 12/72]
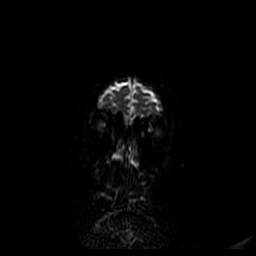
[im 24/72]
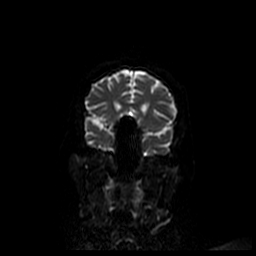
[im 36/72]
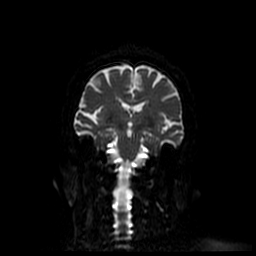
[im 48/72]
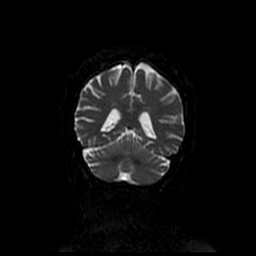
[im 60/72]
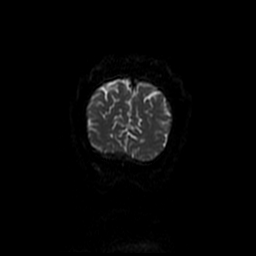
[im 72/72]
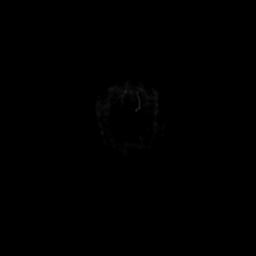

[Series 10: T2 · coronal · 5.0mm · 0.39mm/px · 3 of 27 slices shown (2 of 2)]
[im 1/27]
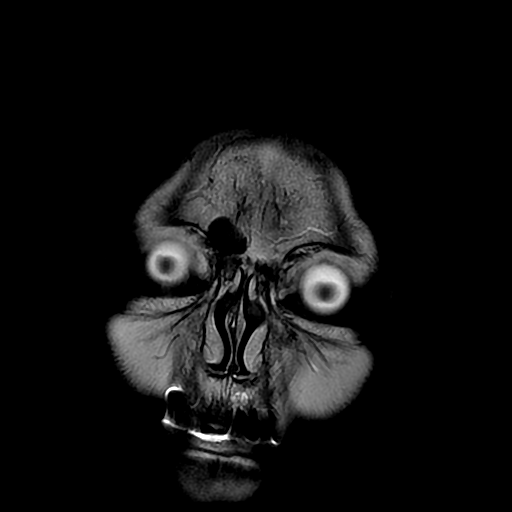
[im 14/27]
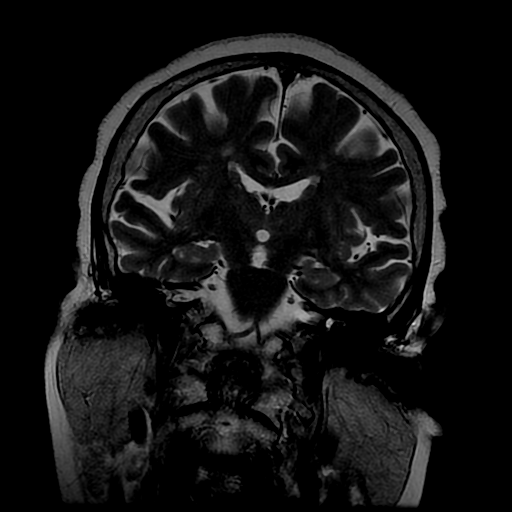
[im 27/27]
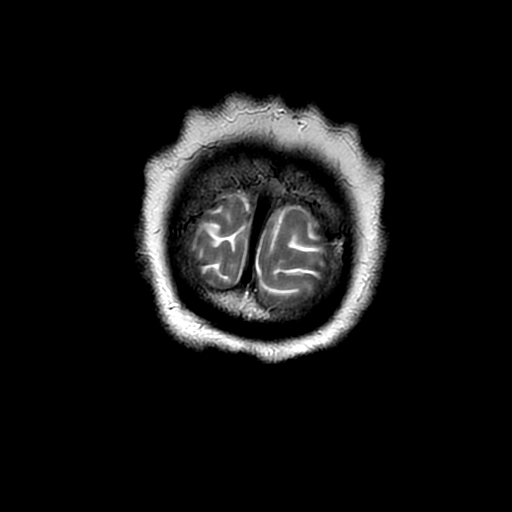

[Series 300: DWI · axial · 3.0mm · 1.09mm/px · z∈[-58,+83]mm · 5 of 49 slices shown (3 of 4)]
[im 1/49]
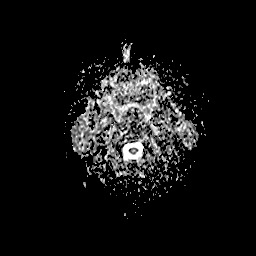
[im 13/49]
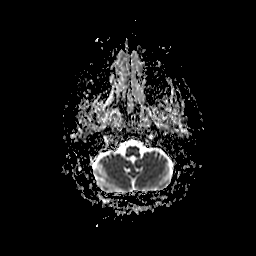
[im 25/49]
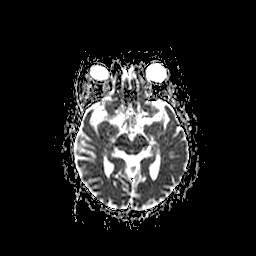
[im 37/49]
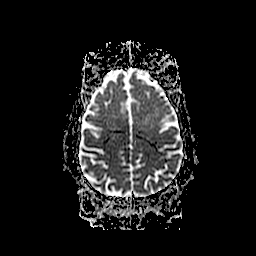
[im 49/49]
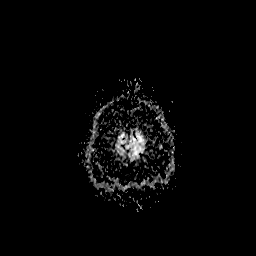

[Series 900: DWI · coronal · 5.0mm · 1.09mm/px · 4 of 36 slices shown (4 of 4)]
[im 1/36]
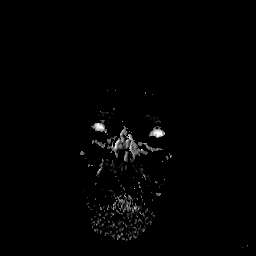
[im 12/36]
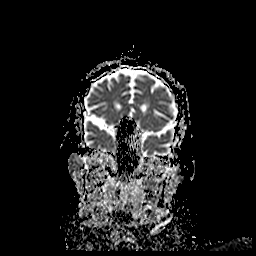
[im 24/36]
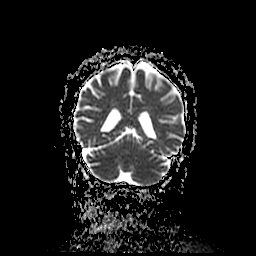
[im 36/36]
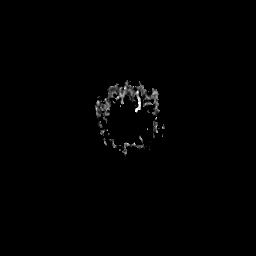

[38 of 48 positions shown; findings below may reference images not displayed]

FINDINGS: There is no evidence of acute infarct, intracranial hemorrhage,
mass, midline shift, or extra-axial fluid collection. There is mild
generalized cerebral atrophy, within normal limits for age and
unchanged. Patchy T2 hyperintensities in the periventricular greater
than subcortical cerebral white matter do not appear significantly
changed and are compatible with mild chronic small vessel ischemic
disease.

Orbits are unremarkable. Right maxillary sinus fluid has decreased
from the prior study. Mastoid air cells are clear. Major
intracranial vascular flow voids are preserved.
IMPRESSION: 1. No acute intracranial abnormality.
2. Mild chronic small vessel ischemic disease.

## 2017-01-08 ENCOUNTER — Emergency Department (HOSPITAL_COMMUNITY): Payer: Medicare Other

## 2017-01-08 ENCOUNTER — Inpatient Hospital Stay (HOSPITAL_COMMUNITY)
Admission: EM | Admit: 2017-01-08 | Discharge: 2017-01-11 | DRG: 872 | Disposition: A | Discharge status: Home Health | Payer: Medicare Other | Attending: Internal Medicine | Admitting: Internal Medicine

## 2017-01-08 ENCOUNTER — Encounter (HOSPITAL_COMMUNITY): Payer: Self-pay | Admitting: Emergency Medicine

## 2017-01-08 DIAGNOSIS — G4733 Obstructive sleep apnea (adult) (pediatric): Secondary | ICD-10-CM | POA: Diagnosis present

## 2017-01-08 DIAGNOSIS — B9689 Other specified bacterial agents as the cause of diseases classified elsewhere: Secondary | ICD-10-CM | POA: Diagnosis present

## 2017-01-08 DIAGNOSIS — A419 Sepsis, unspecified organism: Principal | ICD-10-CM | POA: Diagnosis present

## 2017-01-08 DIAGNOSIS — K5900 Constipation, unspecified: Secondary | ICD-10-CM | POA: Diagnosis present

## 2017-01-08 DIAGNOSIS — R109 Unspecified abdominal pain: Secondary | ICD-10-CM | POA: Diagnosis not present

## 2017-01-08 DIAGNOSIS — E118 Type 2 diabetes mellitus with unspecified complications: Secondary | ICD-10-CM

## 2017-01-08 DIAGNOSIS — M069 Rheumatoid arthritis, unspecified: Secondary | ICD-10-CM | POA: Diagnosis present

## 2017-01-08 DIAGNOSIS — E1165 Type 2 diabetes mellitus with hyperglycemia: Secondary | ICD-10-CM | POA: Diagnosis present

## 2017-01-08 DIAGNOSIS — I110 Hypertensive heart disease with heart failure: Secondary | ICD-10-CM | POA: Diagnosis present

## 2017-01-08 DIAGNOSIS — K219 Gastro-esophageal reflux disease without esophagitis: Secondary | ICD-10-CM | POA: Diagnosis present

## 2017-01-08 DIAGNOSIS — B9620 Unspecified Escherichia coli [E. coli] as the cause of diseases classified elsewhere: Secondary | ICD-10-CM | POA: Diagnosis present

## 2017-01-08 DIAGNOSIS — R079 Chest pain, unspecified: Secondary | ICD-10-CM | POA: Diagnosis present

## 2017-01-08 DIAGNOSIS — K0889 Other specified disorders of teeth and supporting structures: Secondary | ICD-10-CM | POA: Diagnosis present

## 2017-01-08 DIAGNOSIS — E7800 Pure hypercholesterolemia, unspecified: Secondary | ICD-10-CM | POA: Diagnosis present

## 2017-01-08 DIAGNOSIS — Z7984 Long term (current) use of oral hypoglycemic drugs: Secondary | ICD-10-CM

## 2017-01-08 DIAGNOSIS — E860 Dehydration: Secondary | ICD-10-CM | POA: Diagnosis present

## 2017-01-08 DIAGNOSIS — Z87440 Personal history of urinary (tract) infections: Secondary | ICD-10-CM

## 2017-01-08 DIAGNOSIS — N390 Urinary tract infection, site not specified: Secondary | ICD-10-CM

## 2017-01-08 DIAGNOSIS — E119 Type 2 diabetes mellitus without complications: Secondary | ICD-10-CM

## 2017-01-08 DIAGNOSIS — Z7982 Long term (current) use of aspirin: Secondary | ICD-10-CM

## 2017-01-08 DIAGNOSIS — I5032 Chronic diastolic (congestive) heart failure: Secondary | ICD-10-CM | POA: Diagnosis present

## 2017-01-08 DIAGNOSIS — R6510 Systemic inflammatory response syndrome (SIRS) of non-infectious origin without acute organ dysfunction: Secondary | ICD-10-CM

## 2017-01-08 DIAGNOSIS — Z833 Family history of diabetes mellitus: Secondary | ICD-10-CM

## 2017-01-08 DIAGNOSIS — R7989 Other specified abnormal findings of blood chemistry: Secondary | ICD-10-CM | POA: Diagnosis present

## 2017-01-08 DIAGNOSIS — E785 Hyperlipidemia, unspecified: Secondary | ICD-10-CM | POA: Diagnosis present

## 2017-01-08 DIAGNOSIS — R112 Nausea with vomiting, unspecified: Secondary | ICD-10-CM

## 2017-01-08 DIAGNOSIS — Z7952 Long term (current) use of systemic steroids: Secondary | ICD-10-CM

## 2017-01-08 DIAGNOSIS — R460 Very low level of personal hygiene: Secondary | ICD-10-CM | POA: Diagnosis present

## 2017-01-08 DIAGNOSIS — N179 Acute kidney failure, unspecified: Secondary | ICD-10-CM

## 2017-01-08 DIAGNOSIS — J449 Chronic obstructive pulmonary disease, unspecified: Secondary | ICD-10-CM | POA: Diagnosis present

## 2017-01-08 LAB — CBC
HCT: 48.1 % — ABNORMAL HIGH (ref 36.0–46.0)
Hemoglobin: 15 g/dL (ref 12.0–15.0)
MCH: 27.8 pg (ref 26.0–34.0)
MCHC: 31.2 g/dL (ref 30.0–36.0)
MCV: 89.2 fL (ref 78.0–100.0)
PLATELETS: 253 10*3/uL (ref 150–400)
RBC: 5.39 MIL/uL — ABNORMAL HIGH (ref 3.87–5.11)
RDW: 14 % (ref 11.5–15.5)
WBC: 8.4 10*3/uL (ref 4.0–10.5)

## 2017-01-08 LAB — BASIC METABOLIC PANEL
Anion gap: 13 (ref 5–15)
BUN: 22 mg/dL — AB (ref 6–20)
CALCIUM: 9.4 mg/dL (ref 8.9–10.3)
CHLORIDE: 104 mmol/L (ref 101–111)
CO2: 24 mmol/L (ref 22–32)
CREATININE: 1.48 mg/dL — AB (ref 0.44–1.00)
GFR calc Af Amer: 38 mL/min — ABNORMAL LOW (ref >60)
GFR calc non Af Amer: 33 mL/min — ABNORMAL LOW (ref >60)
Glucose, Bld: 226 mg/dL — ABNORMAL HIGH (ref 65–99)
Potassium: 3.9 mmol/L (ref 3.5–5.1)
SODIUM: 141 mmol/L (ref 135–145)

## 2017-01-08 LAB — I-STAT CG4 LACTIC ACID, ED: Lactic Acid, Venous: 2.2 mmol/L (ref 0.5–1.9)

## 2017-01-08 LAB — I-STAT TROPONIN, ED: TROPONIN I, POC: 0.01 ng/mL (ref 0.00–0.08)

## 2017-01-08 MED ORDER — ONDANSETRON HCL 4 MG/2ML IJ SOLN
INTRAMUSCULAR | Status: AC
  Filled 2017-01-08: qty 2

## 2017-01-08 MED ORDER — PIPERACILLIN-TAZOBACTAM 3.375 G IVPB 30 MIN
3.3750 g | Freq: Once | INTRAVENOUS | Status: AC
  Administered 2017-01-08: 3.375 g via INTRAVENOUS
  Filled 2017-01-08: qty 50

## 2017-01-08 MED ORDER — ACETAMINOPHEN 500 MG PO TABS
1000.0000 mg | ORAL_TABLET | Freq: Once | ORAL | Status: AC
  Administered 2017-01-09: 1000 mg via ORAL
  Filled 2017-01-08: qty 2

## 2017-01-08 MED ORDER — SODIUM CHLORIDE 0.9 % IV BOLUS (SEPSIS)
1000.0000 mL | Freq: Once | INTRAVENOUS | Status: AC
  Administered 2017-01-08: 1000 mL via INTRAVENOUS

## 2017-01-08 MED ORDER — IOPAMIDOL (ISOVUE-300) INJECTION 61%
INTRAVENOUS | Status: AC
  Administered 2017-01-08: 100 mL
  Filled 2017-01-08: qty 100

## 2017-01-08 MED ORDER — ONDANSETRON HCL 4 MG/2ML IJ SOLN
4.0000 mg | Freq: Once | INTRAMUSCULAR | Status: AC | PRN
  Administered 2017-01-08: 4 mg via INTRAVENOUS

## 2017-01-08 MED ORDER — VANCOMYCIN HCL IN DEXTROSE 1-5 GM/200ML-% IV SOLN
1000.0000 mg | Freq: Once | INTRAVENOUS | Status: AC
  Administered 2017-01-09: 1000 mg via INTRAVENOUS
  Filled 2017-01-08: qty 200

## 2017-01-08 NOTE — ED Triage Notes (Signed)
Pt presents to ER with family for pallor, diaphoresis, N/V, weakness since Sunday; family states she might have eat something bad; pt reporting she feels like she might pass out; pt tachycardic in triage rate of 140

## 2017-01-08 NOTE — ED Notes (Signed)
Patient returned from xray.

## 2017-01-08 NOTE — ED Notes (Signed)
Patient placed on 2L O2  d/t SpO2 decreasing to 88% RA. Patient transported to x-ray.

## 2017-01-08 NOTE — ED Notes (Signed)
Dr Kathrynn Humble given a copy of lactic acid results 2.20

## 2017-01-08 NOTE — ED Provider Notes (Signed)
[facility] DEPT Provider Note   CSN: [id number] Arrival date & time: 01/08/17  2113     History   Chief Complaint Chief Complaint  Patient presents with  . Abdominal Pain  . Nausea  . Tachycardia  . Pallor  . Excessive Sweating    HPI Dwanna Goshert is a 81 y.o. female.  Joint with past medical history remarkable for chronic bronchitis, anemia, GERD, hypertension and hyperlipidemia and diabetes presents to the emergency department with chief complaint of nausea and vomiting. She reports vomiting for the past 3 days. She reports some lower abdominal pain which is associated with vomiting. She states that she may have had a fever, but never measured a temperature. She denies any chills. She denies any diarrhea, and states that she is generally constipated. She denies any hematuria or dysuria. Additionally, patient states that she is generally short of breath, this is a chronic problem for her, and she normally uses a nebulizer machine at home twice a day. She states that she has been inconsistent about using this, but states that her shortness of breath no worse than normal.   The history is provided by the patient. No language interpreter was used.    Past Medical History:  Diagnosis Date  . Anemia    "when I was young"  . Anginal pain ([place])   . Arthritis    "all over my body"   . Chronic bronchitis ([place])    "get it just about q yr" (06/14/2015)  . Exertional shortness of breath   . Frequent UTI    "recently" (06/14/2015)  . GERD (gastroesophageal reflux disease)   . Headache(784.0)    "probably every other day " (06/14/2015)  . High cholesterol   . History of blood transfusion ~ 1954   "related to my periods"  . Hypertension   . OSA (obstructive sleep apnea)    "I think I'm using a BiPAP" (06/14/2015)  . Type II diabetes mellitus [facility])     Patient Active Problem List   Diagnosis Date Noted  . Multiple falls 06/14/2015  . Weight loss 06/14/2015  . Acute  respiratory failure with hypoxia ([place]) 06/14/2015  . Orthostasis 06/14/2015  . Acute respiratory failure ([place]) 06/14/2015  . Elevated lactic acid level 01/22/2015  . 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 Chronic diastolic heart failure, NYHA class 1 ([place]) 01/22/2015  . Tachycardia with 100 - 120 beats per minute 04/08/2014  . DM (diabetes mellitus) ([place]) 04/08/2014  . Chest pain 04/07/2014  . Hyperglycemia 12/11/2012  . Syncope and collapse 12/11/2012  . Dehydration, moderate 12/11/2012  . Noncompliance 12/11/2012  . UTI (lower urinary tract infection) 12/11/2012  . Chronic steroid use 12/11/2012  . Rheumatoid arthritis ([place]) 12/11/2012    Past Surgical History:  Procedure Laterality Date  . APPENDECTOMY  1969  . BACK SURGERY    . [place]  . FRACTURE SURGERY    . LAPAROSCOPIC CHOLECYSTECTOMY  1990's  . LUMBAR DISC SURGERY     "slipped disc" (12/11/2012)  . ORIF SHOULDER FRACTURE Left ~ 2009   "put cement down in it"   . TONSILLECTOMY  1950's?  Marland Kitchen VAGINAL HYSTERECTOMY  ~ 1973    OB History    No data available       Home Medications    Prior to Admission medications   Medication Sig Start Date End Date Taking? Authorizing Provider  Alum & Mag Hydroxide-Simeth (MAGIC MOUTHWASH W/LIDOCAINE) SOLN Take 5 mLs by mouth 3 (three) times  daily as needed for mouth pain. 03/14/15  Yes Szekalski, Kaitlyn, PA-C  aspirin EC 81 MG tablet Take 1 tablet (81 mg total) by mouth daily. 04/09/14  Yes Debbe Odea, MD  atorvastatin (LIPITOR) 40 MG tablet Take 40 mg by mouth at bedtime. 12/19/16  Yes [provider]  diazepam (VALIUM) 5 MG tablet Take 5 mg by mouth daily as needed for anxiety.   Yes [provider]  hydroxychloroquine (PLAQUENIL) 200 MG tablet Take 200 mg by mouth 2 (two) times daily. 05/15/15  Yes [provider]  IBU 800 MG tablet 800 mg at bedtime as needed for pain. 01/04/17  Yes [provider]  JANUVIA 100 MG tablet  Take 100 mg by mouth daily.  01/11/15  Yes [provider]  losartan (COZAAR) 25 MG tablet Take 25 mg by mouth daily. 12/19/16  Yes [provider]  metFORMIN (GLUCOPHAGE) 500 MG tablet Take 500 mg by mouth 2 (two) times daily. 12/19/16  Yes [provider]  methylPREDNISolone (MEDROL) 4 MG tablet Take 2-4 mg by mouth every morning.  05/21/15  Yes [provider]  omeprazole (PRILOSEC) 20 MG capsule Take 20 mg by mouth 2 (two) times daily. 12/17/16  Yes [provider]  traMADol (ULTRAM) 50 MG tablet Take 50 mg by mouth 2 (two) times daily as needed for pain.  05/04/12  Yes Barbara Cower, MD  docusate sodium (COLACE) 100 MG capsule Take 1 capsule (100 mg total) by mouth 2 (two) times daily. Patient not taking: Reported on 10/06/2015 06/17/15   Reyne Dumas, MD  feeding supplement, ENSURE ENLIVE, (ENSURE ENLIVE) LIQD Take 237 mLs by mouth 2 (two) times daily between meals. Patient not taking: Reported on 10/06/2015 06/17/15   Reyne Dumas, MD  hydrocortisone (CORTEF) 10 MG tablet Take 1 tablet (10 mg total) by mouth 2 (two) times daily. Patient not taking: Reported on 10/06/2015 06/17/15   Reyne Dumas, MD  hydrOXYzine (ATARAX/VISTARIL) 25 MG tablet Take 1 tablet (25 mg total) by mouth every 6 (six) hours. Patient not taking: Reported on 01/08/2017 10/06/15   Veryl Speak, MD  insulin glargine (LANTUS) 100 unit/mL SOPN Inject 0.05 mLs (5 Units total) into the skin daily at 10 pm. Patient not taking: Reported on 10/06/2015 01/24/15   Elgergawy, Silver Huguenin, MD  metoprolol tartrate (LOPRESSOR) 25 MG tablet Take 0.5 tablets (12.5 mg total) by mouth 2 (two) times daily. Patient not taking: Reported on 10/06/2015 06/17/15   Reyne Dumas, MD  SUMAtriptan (IMITREX) 50 MG tablet Take 1 tablet (50 mg total) by mouth every 2 (two) hours as needed for migraine. Patient not taking: Reported on 10/06/2015 12/15/12   Verlee Monte, MD  traZODone (DESYREL) 100 MG tablet Take  1 tablet (100 mg total) by mouth at bedtime as needed for sleep. Patient not taking: Reported on 10/06/2015 06/17/15   Reyne Dumas, MD    Family History Family History  Problem Relation Age of Onset  . Hypertension Mother   . Heart attack Father   . Diabetes Mellitus II Sister   . Hypertension Sister   . Cancer - Other Sister     Social History Social History  Substance Use Topics  . Smoking status: Never Smoker  . Smokeless tobacco: Never Used     Comment: 04/07/2014"1 pack of cigarettes would last 3-4 months; haven't had any cigarettes for 40 years"  . Alcohol use Yes     Comment: "used to drink beer in the 1990's every once in awhile"  Allergies   Patient has no known allergies.   Review of Systems Review of Systems  All other systems reviewed and are negative.    Physical Exam Updated Vital Signs BP 90/77 (BP Location: Right Arm)   Pulse (!) 126   Temp 98 F (36.7 C) (Oral)   Resp (!) 24   Ht 5\' 5"  (1.651 m)   Wt 64 kg (141 lb)   SpO2 92%   BMI 23.46 kg/m   Physical Exam  Constitutional: She is oriented to person, place, and time. She appears well-developed and well-nourished.  HENT:  Head: Normocephalic and atraumatic.  Eyes: Conjunctivae and EOM are normal. Pupils are equal, round, and reactive to light.  Neck: Normal range of motion. Neck supple.  Cardiovascular: Regular rhythm.  Exam reveals no gallop and no friction rub.   No murmur heard. Tachycardic  Pulmonary/Chest: Effort normal and breath sounds normal. No respiratory distress. She has no wheezes. She has no rales. She exhibits no tenderness.  Abdominal: Soft. Bowel sounds are normal. She exhibits no distension and no mass. There is tenderness. There is no rebound and no guarding.  Generalized lower abdominal tenderness  Musculoskeletal: Normal range of motion. She exhibits no edema or tenderness.  Neurological: She is alert and oriented to person, place, and time.  Skin: Skin is warm and  dry.  Psychiatric: She has a normal mood and affect. Her behavior is normal. Judgment and thought content normal.  Nursing note and vitals reviewed.    ED Treatments / Results  Labs (all labs ordered are listed, but only abnormal results are displayed) Labs Reviewed  BASIC METABOLIC PANEL - Abnormal; Notable for the following:       Result Value   Glucose, Bld 226 (*)    BUN 22 (*)    Creatinine, Ser 1.48 (*)    GFR calc non Af Amer 33 (*)    GFR calc Af Amer 38 (*)    All other components within normal limits  CBC - Abnormal; Notable for the following:    RBC 5.39 (*)    HCT 48.1 (*)    All other components within normal limits  URINALYSIS, ROUTINE W REFLEX MICROSCOPIC  I-STAT TROPOININ, ED  I-STAT CG4 LACTIC ACID, ED    EKG  EKG Interpretation None       Radiology Dg Chest 2 View  Result Date: 01/08/2017 CLINICAL DATA:  Acute onset of generalized weakness and tachycardia. Nausea and vomiting. Initial encounter. EXAM: CHEST  2 VIEW COMPARISON:  Chest radiograph performed 10/06/2015 FINDINGS: The lungs are well-aerated and clear. There is no evidence of focal opacification, pleural effusion or pneumothorax. The heart is mildly enlarged. No acute osseous abnormalities are seen. Changes of vertebroplasty are noted at the lower thoracic spine. Clips are noted within the right upper quadrant, reflecting prior cholecystectomy. IMPRESSION: Mild cardiomegaly.  Lungs remain grossly clear. Electronically Signed   By: Garald Balding M.D.   On: 01/08/2017 22:26    Procedures Procedures (including critical care time)  Medications Ordered in ED Medications  iopamidol (ISOVUE-300) 61 % injection (not administered)  ondansetron (ZOFRAN) injection 4 mg (4 mg Intravenous Given 01/08/17 2154)  sodium chloride 0.9 % bolus 1,000 mL (1,000 mLs Intravenous New Bag/Given 01/08/17 2233)     Initial Impression / Assessment and Plan / ED Course  I have reviewed the triage vital signs and the  nursing notes.  Pertinent labs & imaging results that were available during my care of the patient were  reviewed by me and considered in my medical decision making (see chart for details).    Patient with 3 days of vomiting. She also has some lower abdominal pain, and has not had bowel movement. Will check CT abdomen pelvis. Will give fluids to treat tachycardia, which I believe is currently from dehydration. She is afebrile, however will check rectal temperature. Also check lactate.   CT shows no acute process.  SIRS without source.  Seen by and discussed with Dr. Tyrone Nine, who recommends admission.    Appreciate Dr. Roel Cluck for admitting the patient.  Final Clinical Impressions(s) / ED Diagnoses   Final diagnoses:  SIRS (systemic inflammatory response syndrome) (HCC)  Nausea and vomiting, intractability of vomiting not specified, unspecified vomiting type    New Prescriptions New Prescriptions   No medications on file     Montine Circle, Hershal Coria 01/09/17 Vito Berger, MD 01/09/17 1114

## 2017-01-08 NOTE — ED Notes (Signed)
Patient transported to CT via stretcher.

## 2017-01-09 ENCOUNTER — Observation Stay (HOSPITAL_BASED_OUTPATIENT_CLINIC_OR_DEPARTMENT_OTHER): Payer: Medicare Other

## 2017-01-09 ENCOUNTER — Encounter (HOSPITAL_COMMUNITY): Payer: Self-pay | Admitting: Internal Medicine

## 2017-01-09 DIAGNOSIS — B9620 Unspecified Escherichia coli [E. coli] as the cause of diseases classified elsewhere: Secondary | ICD-10-CM | POA: Diagnosis present

## 2017-01-09 DIAGNOSIS — E1121 Type 2 diabetes mellitus with diabetic nephropathy: Secondary | ICD-10-CM | POA: Diagnosis not present

## 2017-01-09 DIAGNOSIS — I110 Hypertensive heart disease with heart failure: Secondary | ICD-10-CM | POA: Diagnosis present

## 2017-01-09 DIAGNOSIS — J410 Simple chronic bronchitis: Secondary | ICD-10-CM | POA: Diagnosis not present

## 2017-01-09 DIAGNOSIS — Z7984 Long term (current) use of oral hypoglycemic drugs: Secondary | ICD-10-CM | POA: Diagnosis not present

## 2017-01-09 DIAGNOSIS — B9689 Other specified bacterial agents as the cause of diseases classified elsewhere: Secondary | ICD-10-CM | POA: Diagnosis present

## 2017-01-09 DIAGNOSIS — E860 Dehydration: Secondary | ICD-10-CM | POA: Diagnosis present

## 2017-01-09 DIAGNOSIS — R460 Very low level of personal hygiene: Secondary | ICD-10-CM | POA: Diagnosis present

## 2017-01-09 DIAGNOSIS — A419 Sepsis, unspecified organism: Secondary | ICD-10-CM | POA: Diagnosis not present

## 2017-01-09 DIAGNOSIS — R079 Chest pain, unspecified: Secondary | ICD-10-CM | POA: Diagnosis not present

## 2017-01-09 DIAGNOSIS — E1122 Type 2 diabetes mellitus with diabetic chronic kidney disease: Secondary | ICD-10-CM

## 2017-01-09 DIAGNOSIS — J449 Chronic obstructive pulmonary disease, unspecified: Secondary | ICD-10-CM | POA: Diagnosis not present

## 2017-01-09 DIAGNOSIS — N390 Urinary tract infection, site not specified: Secondary | ICD-10-CM | POA: Diagnosis present

## 2017-01-09 DIAGNOSIS — R112 Nausea with vomiting, unspecified: Secondary | ICD-10-CM

## 2017-01-09 DIAGNOSIS — M069 Rheumatoid arthritis, unspecified: Secondary | ICD-10-CM | POA: Diagnosis present

## 2017-01-09 DIAGNOSIS — E7800 Pure hypercholesterolemia, unspecified: Secondary | ICD-10-CM | POA: Diagnosis present

## 2017-01-09 DIAGNOSIS — I5030 Unspecified diastolic (congestive) heart failure: Secondary | ICD-10-CM

## 2017-01-09 DIAGNOSIS — M05719 Rheumatoid arthritis with rheumatoid factor of unspecified shoulder without organ or systems involvement: Secondary | ICD-10-CM | POA: Diagnosis not present

## 2017-01-09 DIAGNOSIS — R109 Unspecified abdominal pain: Secondary | ICD-10-CM | POA: Diagnosis present

## 2017-01-09 DIAGNOSIS — E785 Hyperlipidemia, unspecified: Secondary | ICD-10-CM | POA: Diagnosis present

## 2017-01-09 DIAGNOSIS — Z87440 Personal history of urinary (tract) infections: Secondary | ICD-10-CM | POA: Diagnosis not present

## 2017-01-09 DIAGNOSIS — G4733 Obstructive sleep apnea (adult) (pediatric): Secondary | ICD-10-CM | POA: Diagnosis present

## 2017-01-09 DIAGNOSIS — E118 Type 2 diabetes mellitus with unspecified complications: Secondary | ICD-10-CM | POA: Diagnosis not present

## 2017-01-09 DIAGNOSIS — Z7952 Long term (current) use of systemic steroids: Secondary | ICD-10-CM | POA: Diagnosis not present

## 2017-01-09 DIAGNOSIS — K219 Gastro-esophageal reflux disease without esophagitis: Secondary | ICD-10-CM | POA: Diagnosis present

## 2017-01-09 DIAGNOSIS — J439 Emphysema, unspecified: Secondary | ICD-10-CM | POA: Diagnosis not present

## 2017-01-09 DIAGNOSIS — Z833 Family history of diabetes mellitus: Secondary | ICD-10-CM | POA: Diagnosis not present

## 2017-01-09 DIAGNOSIS — K5900 Constipation, unspecified: Secondary | ICD-10-CM | POA: Diagnosis present

## 2017-01-09 DIAGNOSIS — R7881 Bacteremia: Secondary | ICD-10-CM | POA: Diagnosis not present

## 2017-01-09 DIAGNOSIS — N182 Chronic kidney disease, stage 2 (mild): Secondary | ICD-10-CM

## 2017-01-09 DIAGNOSIS — E1165 Type 2 diabetes mellitus with hyperglycemia: Secondary | ICD-10-CM | POA: Diagnosis present

## 2017-01-09 DIAGNOSIS — Z7982 Long term (current) use of aspirin: Secondary | ICD-10-CM | POA: Diagnosis not present

## 2017-01-09 DIAGNOSIS — N179 Acute kidney failure, unspecified: Secondary | ICD-10-CM | POA: Diagnosis present

## 2017-01-09 DIAGNOSIS — R7989 Other specified abnormal findings of blood chemistry: Secondary | ICD-10-CM

## 2017-01-09 DIAGNOSIS — K0889 Other specified disorders of teeth and supporting structures: Secondary | ICD-10-CM | POA: Diagnosis present

## 2017-01-09 DIAGNOSIS — I5032 Chronic diastolic (congestive) heart failure: Secondary | ICD-10-CM | POA: Diagnosis present

## 2017-01-09 HISTORY — DX: Chronic obstructive pulmonary disease, unspecified: J44.9

## 2017-01-09 HISTORY — DX: Sepsis, unspecified organism: A41.9

## 2017-01-09 LAB — URINALYSIS, ROUTINE W REFLEX MICROSCOPIC
Bilirubin Urine: NEGATIVE
Glucose, UA: >=500 mg/dL — AB
Hgb urine dipstick: NEGATIVE
KETONES UR: 5 mg/dL — AB
Nitrite: POSITIVE — AB
PROTEIN: NEGATIVE mg/dL
Specific Gravity, Urine: >1.046 — ABNORMAL HIGH (ref 1.005–1.030)
pH: 6 (ref 5.0–8.0)

## 2017-01-09 LAB — CBC
HCT: 42.5 % (ref 36.0–46.0)
HEMOGLOBIN: 13.4 g/dL (ref 12.0–15.0)
MCH: 28.3 pg (ref 26.0–34.0)
MCHC: 31.5 g/dL (ref 30.0–36.0)
MCV: 89.9 fL (ref 78.0–100.0)
Platelets: 196 10*3/uL (ref 150–400)
RBC: 4.73 MIL/uL (ref 3.87–5.11)
RDW: 13.9 % (ref 11.5–15.5)
WBC: 7.3 10*3/uL (ref 4.0–10.5)

## 2017-01-09 LAB — LACTIC ACID, PLASMA
LACTIC ACID, VENOUS: 2.2 mmol/L — AB (ref 0.5–1.9)
LACTIC ACID, VENOUS: 1.7 mmol/L (ref 0.5–1.9)
LACTIC ACID, VENOUS: 2.5 mmol/L — AB (ref 0.5–1.9)
LACTIC ACID, VENOUS: 1.9 mmol/L (ref 0.5–1.9)
Lactic Acid, Venous: 2.7 mmol/L (ref 0.5–1.9)

## 2017-01-09 LAB — COMPREHENSIVE METABOLIC PANEL
ALBUMIN: 3.1 g/dL — AB (ref 3.5–5.0)
ALK PHOS: 41 U/L (ref 38–126)
ALT: 17 U/L (ref 14–54)
AST: 24 U/L (ref 15–41)
Anion gap: 10 (ref 5–15)
BILIRUBIN TOTAL: 0.6 mg/dL (ref 0.3–1.2)
BUN: 18 mg/dL (ref 6–20)
CALCIUM: 8.5 mg/dL — AB (ref 8.9–10.3)
CO2: 27 mmol/L (ref 22–32)
CREATININE: 1.29 mg/dL — AB (ref 0.44–1.00)
Chloride: 104 mmol/L (ref 101–111)
GFR calc Af Amer: 45 mL/min — ABNORMAL LOW (ref >60)
GFR calc non Af Amer: 39 mL/min — ABNORMAL LOW (ref >60)
GLUCOSE: 144 mg/dL — AB (ref 65–99)
Potassium: 4 mmol/L (ref 3.5–5.1)
SODIUM: 141 mmol/L (ref 135–145)
TOTAL PROTEIN: 6.1 g/dL — AB (ref 6.5–8.1)

## 2017-01-09 LAB — GLUCOSE, CAPILLARY
GLUCOSE-CAPILLARY: 153 mg/dL — AB (ref 65–99)
GLUCOSE-CAPILLARY: 153 mg/dL — AB (ref 65–99)
GLUCOSE-CAPILLARY: 97 mg/dL (ref 65–99)
GLUCOSE-CAPILLARY: 176 mg/dL — AB (ref 65–99)
Glucose-Capillary: 168 mg/dL — ABNORMAL HIGH (ref 65–99)
Glucose-Capillary: 194 mg/dL — ABNORMAL HIGH (ref 65–99)

## 2017-01-09 LAB — TSH: TSH: 0.214 u[IU]/mL — ABNORMAL LOW (ref 0.350–4.500)

## 2017-01-09 LAB — TROPONIN I: Troponin I: <0.03 ng/mL (ref <0.03)

## 2017-01-09 LAB — ECHOCARDIOGRAM COMPLETE
Height: 64 in
Weight: 2268.09 oz

## 2017-01-09 LAB — CREATININE, URINE, RANDOM: Creatinine, Urine: 51.31 mg/dL

## 2017-01-09 LAB — SODIUM, URINE, RANDOM: SODIUM UR: 72 mmol/L

## 2017-01-09 LAB — PHOSPHORUS: Phosphorus: 3.8 mg/dL (ref 2.5–4.6)

## 2017-01-09 LAB — MAGNESIUM: MAGNESIUM: 2.3 mg/dL (ref 1.7–2.4)

## 2017-01-09 LAB — PROCALCITONIN: PROCALCITONIN: 0.16 ng/mL

## 2017-01-09 LAB — I-STAT CG4 LACTIC ACID, ED: Lactic Acid, Venous: 1.73 mmol/L (ref 0.5–1.9)

## 2017-01-09 LAB — MRSA PCR SCREENING: MRSA BY PCR: NEGATIVE

## 2017-01-09 MED ORDER — HYDROCORTISONE NA SUCCINATE PF 100 MG IJ SOLR
100.0000 mg | Freq: Three times a day (TID) | INTRAMUSCULAR | Status: DC
  Administered 2017-01-09 – 2017-01-10 (×4): 100 mg via INTRAVENOUS
  Filled 2017-01-09 (×5): qty 2

## 2017-01-09 MED ORDER — SODIUM CHLORIDE 0.9% FLUSH
3.0000 mL | Freq: Two times a day (BID) | INTRAVENOUS | Status: DC
  Administered 2017-01-09 – 2017-01-11 (×2): 3 mL via INTRAVENOUS

## 2017-01-09 MED ORDER — ORAL CARE MOUTH RINSE
15.0000 mL | Freq: Two times a day (BID) | OROMUCOSAL | Status: DC
  Administered 2017-01-09 – 2017-01-11 (×4): 15 mL via OROMUCOSAL

## 2017-01-09 MED ORDER — ATORVASTATIN CALCIUM 40 MG PO TABS
40.0000 mg | ORAL_TABLET | Freq: Every day | ORAL | Status: DC
  Administered 2017-01-09 – 2017-01-10 (×2): 40 mg via ORAL
  Filled 2017-01-09 (×2): qty 1

## 2017-01-09 MED ORDER — HYDROCODONE-ACETAMINOPHEN 5-325 MG PO TABS: 1.0000 | ORAL_TABLET | ORAL | Status: DC | PRN

## 2017-01-09 MED ORDER — SODIUM CHLORIDE 0.9 % IV BOLUS (SEPSIS)
1000.0000 mL | Freq: Once | INTRAVENOUS | Status: AC
  Administered 2017-01-09: 1000 mL via INTRAVENOUS

## 2017-01-09 MED ORDER — HYDROXYCHLOROQUINE SULFATE 200 MG PO TABS
200.0000 mg | ORAL_TABLET | Freq: Two times a day (BID) | ORAL | Status: DC
  Administered 2017-01-09 – 2017-01-11 (×5): 200 mg via ORAL
  Filled 2017-01-09 (×6): qty 1

## 2017-01-09 MED ORDER — PANTOPRAZOLE SODIUM 40 MG PO TBEC
40.0000 mg | DELAYED_RELEASE_TABLET | Freq: Every day | ORAL | Status: DC
  Administered 2017-01-09 – 2017-01-11 (×3): 40 mg via ORAL
  Filled 2017-01-09 (×3): qty 1

## 2017-01-09 MED ORDER — DEXTROSE 5 % IV SOLN: 2.0000 g | Freq: Once | INTRAVENOUS | Status: DC

## 2017-01-09 MED ORDER — LEVALBUTEROL HCL 0.63 MG/3ML IN NEBU
0.6300 mg | INHALATION_SOLUTION | RESPIRATORY_TRACT | Status: DC | PRN
  Administered 2017-01-11: 0.63 mg via RESPIRATORY_TRACT
  Filled 2017-01-09: qty 3

## 2017-01-09 MED ORDER — POLYETHYLENE GLYCOL 3350 17 G PO PACK: 17.0000 g | PACK | Freq: Every day | ORAL | Status: DC | PRN

## 2017-01-09 MED ORDER — BISACODYL 10 MG RE SUPP: 10.0000 mg | Freq: Every day | RECTAL | Status: DC | PRN

## 2017-01-09 MED ORDER — ASPIRIN EC 81 MG PO TBEC
81.0000 mg | DELAYED_RELEASE_TABLET | Freq: Every day | ORAL | Status: DC
  Administered 2017-01-09 – 2017-01-11 (×3): 81 mg via ORAL
  Filled 2017-01-09 (×3): qty 1

## 2017-01-09 MED ORDER — DEXTROSE 5 % IV SOLN
1.0000 g | INTRAVENOUS | Status: DC
  Administered 2017-01-09: 1 g via INTRAVENOUS
  Filled 2017-01-09 (×2): qty 10

## 2017-01-09 MED ORDER — INSULIN ASPART 100 UNIT/ML ~~LOC~~ SOLN
0.0000 [IU] | SUBCUTANEOUS | Status: DC
  Administered 2017-01-09 (×4): 2 [IU] via SUBCUTANEOUS

## 2017-01-09 MED ORDER — ACETAMINOPHEN 650 MG RE SUPP: 650.0000 mg | Freq: Four times a day (QID) | RECTAL | Status: DC | PRN

## 2017-01-09 MED ORDER — SENNA 8.6 MG PO TABS
1.0000 | ORAL_TABLET | Freq: Two times a day (BID) | ORAL | Status: DC
  Administered 2017-01-11: 8.6 mg via ORAL
  Filled 2017-01-09: qty 1

## 2017-01-09 MED ORDER — ONDANSETRON HCL 4 MG/2ML IJ SOLN: 4.0000 mg | Freq: Four times a day (QID) | INTRAMUSCULAR | Status: DC | PRN

## 2017-01-09 MED ORDER — SODIUM CHLORIDE 0.9 % IV SOLN
INTRAVENOUS | Status: DC
  Administered 2017-01-09 – 2017-01-10 (×2): via INTRAVENOUS

## 2017-01-09 MED ORDER — ACETAMINOPHEN 325 MG PO TABS
650.0000 mg | ORAL_TABLET | Freq: Four times a day (QID) | ORAL | Status: DC | PRN
  Administered 2017-01-10: 650 mg via ORAL
  Filled 2017-01-09: qty 2

## 2017-01-09 MED ORDER — ENOXAPARIN SODIUM 30 MG/0.3ML ~~LOC~~ SOLN
30.0000 mg | SUBCUTANEOUS | Status: DC
  Administered 2017-01-09 – 2017-01-10 (×2): 30 mg via SUBCUTANEOUS
  Filled 2017-01-09 (×2): qty 0.3

## 2017-01-09 MED ORDER — GUAIFENESIN ER 600 MG PO TB12
600.0000 mg | ORAL_TABLET | Freq: Two times a day (BID) | ORAL | Status: DC
  Administered 2017-01-09 – 2017-01-11 (×5): 600 mg via ORAL
  Filled 2017-01-09 (×5): qty 1

## 2017-01-09 MED ORDER — INSULIN ASPART 100 UNIT/ML ~~LOC~~ SOLN
0.0000 [IU] | Freq: Three times a day (TID) | SUBCUTANEOUS | Status: DC
  Administered 2017-01-10: 1 [IU] via SUBCUTANEOUS
  Administered 2017-01-10: 3 [IU] via SUBCUTANEOUS
  Administered 2017-01-10: 2 [IU] via SUBCUTANEOUS

## 2017-01-09 MED ORDER — ONDANSETRON HCL 4 MG PO TABS: 4.0000 mg | ORAL_TABLET | Freq: Four times a day (QID) | ORAL | Status: DC | PRN

## 2017-01-09 MED ORDER — TRAMADOL HCL 50 MG PO TABS
50.0000 mg | ORAL_TABLET | Freq: Four times a day (QID) | ORAL | Status: DC | PRN
  Administered 2017-01-09 – 2017-01-11 (×4): 50 mg via ORAL
  Filled 2017-01-09 (×5): qty 1

## 2017-01-09 MED ORDER — PIPERACILLIN-TAZOBACTAM 3.375 G IVPB
3.3750 g | Freq: Three times a day (TID) | INTRAVENOUS | Status: DC
  Filled 2017-01-09: qty 50

## 2017-01-09 MED ORDER — SODIUM CHLORIDE 0.9 % IV BOLUS (SEPSIS)
500.0000 mL | Freq: Once | INTRAVENOUS | Status: AC
  Administered 2017-01-09: 500 mL via INTRAVENOUS

## 2017-01-09 NOTE — Progress Notes (Signed)
CRITICAL VALUE ALERT  Critical Value:  Lactic Acid 2.2  Date & Time Notied:  01/09/17 0550  Provider Notified: On call NP Schorr  Orders Received/Actions taken: 500 mL bolus. Will continue to monitor.

## 2017-01-09 NOTE — Progress Notes (Signed)
CRITICAL VALUE ALERT  Critical Value:  Lactic Acid 2.5, up from 1.7 @ 0730  Date & Time Notied:  6812  Provider Notified: Sherral Hammers via pager.   Paged: 1030 Lactic acid results back up at 2.5  Pt has received total of 1.5L, light headed upon standing. thanks.  Orders Received/Actions taken: awaiting orders.

## 2017-01-09 NOTE — Progress Notes (Signed)
MD at bedside, patients mouth examined & assessed to have multiple broken teeth that are black in middle on top. Patient states she uses mouthwash prescribed by previous MD and had no complaints of pain in area. Patient R side of face is swollen greater than L. Patient states that previous MD had plans "to take them out but had to wait until the infection cleared".

## 2017-01-09 NOTE — Progress Notes (Signed)
PROGRESS NOTE    [name]  OMB:[id number] DOB: 1936/03/26 DOA: 01/08/2017 PCP: Nolene Ebbs, MD   Brief Narrative:  81 y.o. BF PMHx  DM type 2, chronic bronchitis, OSA (BiPAP?), anemia, GERD, HLD, RA on chronic steroids and plaquenil    Presented with 1 week  history of nausea vomiting and generalized fatigue to feeling lightheaded patient was worried that she has eaten something bad her heart rate is   has been elevated up to 140s. She has been having intermittent left sided chest pain worse with exertion. She reports feeling lightheaded when she stands up. Curretnly chest pain free.  She's been having lower abdominal pain no diarrhea although she has been feeling constipated and she has not had any dysuria but she feels that she may have been some afebrile. She is chronically short of breath and notes that her baseline she uses nebulizers twice a day. She reports no BM for the past 3 days  reports frequent UTI   Subjective: 6/14 A/O x 4 states N/V for several weeks. Also teeth pain which was being managed with some type of medication she uses to rub or gums. Have been told by periodontist her damaged teeth would be removed one infection treated.    Assessment & Plan:   Active Problems:   Dehydration, moderate   Rheumatoid arthritis (HCC)   Chest pain   DM (diabetes mellitus) (HCC)   Elevated lactic acid level   Chronic diastolic heart failure, NYHA class 1 (HCC)   Acute lower UTI   Sepsis (HCC)   Constipation   COPD (chronic obstructive pulmonary disease) (HCC)   Sepsis ([place]) Admit per Sepsis protocol likely source being  UTI   - rehydrate    -Cover with zosyn reviewed prior sensitivities  given her recurrent UTIs and severity of illness will call broadly until urine culture has been obtained this was discussed with [facility] in for coronary recommendation  -  obtain blood cultures  - Obtain serial lactic acid  - Obtain procalcitonin level  - Admit and monitor vital  signs closely  -  PCCM is aware  Sepsis -   . Acute lower UTI  - be able to cover with Zosyn for now await results of urine culture . Chronic diastolic heart failure, NYHA class 1 (HCC) stable given tachycardia and AKI  will hydrate monitor for any evidence fluid overload . Dehydration, moderate rehydrate and monitor check orthostatics in a.m. Marland Kitchen Elevated lactic acid level continue to cycle lactic acid and rehydrate . Rheumatoid arthritis (HCC) continue Plaquenil, order hydrocortisone IV while critically ill  Diabetes mellitus -  hold Januvia  order a sliding scale . Constipation  - order bowel regimen  AKI most likely secondary to dehydration secondary to nausea and vomiting will rehydrate and follow kidney function obtain urine electrolytes  Chest pain given risk factors will cycle cardiac enzymes monitored and her telemetry obtain echogram likely setting of severe tachycardia likely benefit from cardiology evaluation when acute illness resolves.  Chronic bronchitis COPD continue home medications Other plan as per orders.   DVT prophylaxis: Lovenox Code Status: Full Family Communication: None Disposition Plan: Resolution sepsis   Consultants:    Procedures/Significant Events:  None   VENTILATOR SETTINGS: None   Cultures Pending  Antimicrobials: Anti-infectives    Start     Stop   01/09/17 1000  hydroxychloroquine (PLAQUENIL) tablet 200 mg         01/09/17 0800  piperacillin-tazobactam (ZOSYN) IVPB 3.375 g  Status:  Discontinued     01/09/17 0427   01/09/17 0600  cefTRIAXone (ROCEPHIN) 1 g in dextrose 5 % 50 mL IVPB         01/09/17 0415  cefTRIAXone (ROCEPHIN) 2 g in dextrose 5 % 50 mL IVPB  Status:  Discontinued     01/09/17 0415   01/08/17 2330  piperacillin-tazobactam (ZOSYN) IVPB 3.375 g     01/09/17 0023   01/08/17 2330  vancomycin (VANCOCIN) IVPB 1000 mg/200 mL premix     01/09/17 0132       Devices None  LINES / TUBES:   None    Continuous Infusions: . cefTRIAXone (ROCEPHIN)  IV Stopped (01/09/17 0559)     Objective: Vitals:   01/09/17 0230 01/09/17 0300 01/09/17 0348 01/09/17 0406  BP: (!) 110/50 111/69 114/63 131/62  Pulse: (!) 114 (!) 111 99 (!) 101  Resp: 20 (!) 25 20 (!) 24  Temp:    98.5 F (36.9 C)  TempSrc:    Oral  SpO2:  99% 100% 96%  Weight:    141 lb 12.1 oz (64.3 kg)  Height:    5\' 4"  (1.626 m)    Intake/Output Summary (Last 24 hours) at 01/09/17 0726 Last data filed at 01/09/17 0400  Gross per 24 hour  Intake             1730 ml  Output                0 ml  Net             1730 ml   Filed Weights   01/08/17 2122 01/09/17 0406  Weight: 141 lb (64 kg) 141 lb 12.1 oz (64.3 kg)    Examination:  General: A/O 4, No acute respiratory distress Eyes: negative scleral hemorrhage, negative anisocoria, negative icterus ENT: Negative Runny nose, negative gingival bleeding, extremely poor dentation multiple teeth fractured off at the gum with obvious rot. Right-sided facial swelling/neck swelling negative pain to palpation Neck:  Negative scars, masses, torticollis, lymphadenopathy, JVD Lungs: Clear to auscultation bilaterally without wheezes or crackles Cardiovascular: Regular rate and rhythm without murmur gallop or rub normal S1 and S2 Abdomen: negative abdominal pain, nondistended, positive soft, bowel sounds, no rebound, no ascites, no appreciable mass Extremities: No significant cyanosis, clubbing, or edema bilateral lower extremities Skin: Negative rashes, lesions, ulcers Psychiatric:  Negative depression, negative anxiety, negative fatigue, negative mania  Central nervous system:  Cranial nerves II through XII intact, tongue/uvula midline, all extremities muscle strength 5/5, sensation intact throughout, f negative dysarthria, negative expressive aphasia, negative receptive aphasia.  .     Data Reviewed: Care during the described time interval was provided by me .  I  have reviewed this patient's available data, including medical history, events of note, physical examination, and all test results as part of my evaluation. I have personally reviewed and interpreted all radiology studies.  CBC:  Recent Labs Lab 01/08/17 2126 01/09/17 0448  WBC 8.4 7.3  HGB 15.0 13.4  HCT 48.1* 42.5  MCV 89.2 89.9  PLT 253 254   Basic Metabolic Panel:  Recent Labs Lab 01/08/17 2126 01/09/17 0448  NA 141 141  K 3.9 4.0  CL 104 104  CO2 24 27  GLUCOSE 226* 144*  BUN 22* 18  CREATININE 1.48* 1.29*  CALCIUM 9.4 8.5*  MG  --  2.3  PHOS  --  3.8   GFR: Estimated Creatinine Clearance: 31 mL/min (A) (by C-G formula based on SCr of  1.29 mg/dL (H)). Liver Function Tests:  Recent Labs Lab 01/09/17 0448  AST 24  ALT 17  ALKPHOS 41  BILITOT 0.6  PROT 6.1*  ALBUMIN 3.1*   No results for input(s): LIPASE, AMYLASE in the last 168 hours. No results for input(s): AMMONIA in the last 168 hours. Coagulation Profile: No results for input(s): INR, PROTIME in the last 168 hours. Cardiac Enzymes:  Recent Labs Lab 01/09/17 0221  TROPONINI <0.03   BNP (last 3 results) No results for input(s): PROBNP in the last 8760 hours. HbA1C: No results for input(s): HGBA1C in the last 72 hours. CBG:  Recent Labs Lab 01/09/17 0404  GLUCAP 153*   Lipid Profile: No results for input(s): CHOL, HDL, LDLCALC, TRIG, CHOLHDL, LDLDIRECT in the last 72 hours. Thyroid Function Tests:  Recent Labs  01/09/17 0448  TSH 0.214*   Anemia Panel: No results for input(s): VITAMINB12, FOLATE, FERRITIN, TIBC, IRON, RETICCTPCT in the last 72 hours. Urine analysis:    Component Value Date/Time   COLORURINE STRAW (A) 01/08/2017 2351   APPEARANCEUR CLEAR 01/08/2017 2351   LABSPEC >1.046 (H) 01/08/2017 2351   PHURINE 6.0 01/08/2017 2351   GLUCOSEU >=500 (A) 01/08/2017 2351   HGBUR NEGATIVE 01/08/2017 2351   BILIRUBINUR NEGATIVE 01/08/2017 2351   KETONESUR 5 (A) 01/08/2017 2351    PROTEINUR NEGATIVE 01/08/2017 2351   UROBILINOGEN 1.0 03/10/2015 1459   NITRITE POSITIVE (A) 01/08/2017 2351   LEUKOCYTESUR SMALL (A) 01/08/2017 2351   Sepsis Labs: @LABRCNTIP (procalcitonin:4,lacticidven:4)  )No results found for this or any previous visit (from the past 240 hour(s)).       Radiology Studies: Dg Chest 2 View  Result Date: 01/08/2017 CLINICAL DATA:  Acute onset of generalized weakness and tachycardia. Nausea and vomiting. Initial encounter. EXAM: CHEST  2 VIEW COMPARISON:  Chest radiograph performed 10/06/2015 FINDINGS: The lungs are well-aerated and clear. There is no evidence of focal opacification, pleural effusion or pneumothorax. The heart is mildly enlarged. No acute osseous abnormalities are seen. Changes of vertebroplasty are noted at the lower thoracic spine. Clips are noted within the right upper quadrant, reflecting prior cholecystectomy. IMPRESSION: Mild cardiomegaly.  Lungs remain grossly clear. Electronically Signed   By: Garald Balding M.D.   On: 01/08/2017 22:26   Ct Abdomen Pelvis W Contrast  Result Date: 01/08/2017 CLINICAL DATA:  Generalized body weakness, nausea and vomiting EXAM: CT ABDOMEN AND PELVIS WITH CONTRAST TECHNIQUE: Multidetector CT imaging of the abdomen and pelvis was performed using the standard protocol following bolus administration of intravenous contrast. CONTRAST:  133mL ISOVUE-300 IOPAMIDOL (ISOVUE-300) INJECTION 61% COMPARISON:  CT exam from 05/24/2006 and 01/29/2015 FINDINGS: Lower chest: Normal size cardiac chambers. Scarring and atelectasis at the right lung base. No effusion or pneumothorax. No pericardial effusion. Hepatobiliary: Cholecystectomy. No space-occupying mass of the liver. No choledocholithiasis. Pancreas: No space-occupying mass of the pancreas. Pancreatic duct is mildly dilated up to 4-5 mm without obstructing source noted and may reflect chronic changes secondary to cholecystectomy. This may represent some ectasia.  Spleen: Normal in size without focal abnormality. Adrenals/Urinary Tract: Normal bilateral adrenal glands and kidneys. No obstructive uropathy. Bladder is unremarkable. Stomach/Bowel: Physiologically distended stomach. Interposed between the left hepatic lobe and stomach is a calcified mass measuring 2.1 x 2.2 cm, series 3, image 15 which dates back to 2007 but is densely calcified and likely to reflect a benign finding possibly a gastric diverticulum with calcification seen within. There is normal small bowel rotation without obstruction or inflammation. The patient is status post  hysterectomy. No large bowel abnormality is noted. Vascular/Lymphatic: Aortic atherosclerosis without aneurysm. No lymphadenopathy. Reproductive: Status post hysterectomy.  No adnexal mass. Other: No ascites.  No hernia. Musculoskeletal: Vertebral augmentation at T11 and T12 with slight interval progression of compression fracture of L4 involving the inferior endplate since 2440. Vacuum disc with worsening of degenerative disc disease also noted at L2-3. IMPRESSION: 1. No acute intraabdominal nor pelvic abnormality. 2. Status post cholecystectomy with chronic mild ductal dilatation the pancreas and common bile duct likely representing reservoir effect from prior surgery. 3. 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 4. Aortic atherosclerosis. 5. Lumbar spondylosis with vertebral augmentation at T11 and T12. Worsening of degenerative disc disease at L2-3 and inferior endplate compression of L4 since 2016 comparison. Electronically Signed   By: Ashley Royalty M.D.   On: 01/08/2017 23:27        Scheduled Meds: . aspirin EC  81 mg Oral Daily  . atorvastatin  40 mg Oral QHS  . enoxaparin (LOVENOX) injection  30 mg Subcutaneous Q24H  . guaiFENesin  600  mg Oral BID  . hydrocortisone sod succinate (SOLU-CORTEF) inj  100 mg Intravenous Q8H  . hydroxychloroquine  200 mg Oral BID  . insulin aspart  0-9 Units Subcutaneous Q4H  . mouth rinse  15 mL Mouth Rinse BID  . pantoprazole  40 mg Oral Daily  . senna  1 tablet Oral BID  . sodium chloride flush  3 mL Intravenous Q12H   Continuous Infusions: . cefTRIAXone (ROCEPHIN)  IV Stopped (01/09/17 0559)     LOS: 0 days    Time spent: 40 minutes    WOODS, Geraldo Docker, MD [facility] Pager [phone]   If 7PM-7AM, please contact night-coverage [email or web address] Password [facility] 01/09/2017, 7:26 AM

## 2017-01-09 NOTE — Care Management Note (Signed)
Case Management Note  Patient Details  Name: [name] MRN: [id number] Date of Birth: [birth year]  Subjective/Objective:   From home alone, presents with Sepsis, ? Uti.  She states her niece and nephew, Gaylan Gerold and [place] [employer] checks on her every day, and she has very good neighbors who check on her also.  She would like to have a 3 n 1 to go over her commode. She states she does not want [place] services.  NCM informed her , will follow along to see what her needs will before she goes home.  Await pt/ot eval.    PCP  Nolene Ebbs               Action/Plan: NCM will follow for dc needs.   Expected Discharge Date:                  Expected Discharge Plan:  [place]  In-House Referral:     Discharge planning Services  CM Consult  Post Acute Care Choice:    Choice offered to:     DME Arranged:    DME Agency:     HH Arranged:    [place] Agency:     Status of Service:  In process, will continue to follow  If discussed at Long Length of Stay Meetings, dates discussed:    Additional Comments:  Zenon Mayo, RN 01/09/2017, 3:03 PM

## 2017-01-09 NOTE — Progress Notes (Addendum)
Pharmacy Antibiotic Note  [name] is a 81 y.o. female admitted on 01/08/2017 with UTI.  Pharmacy has been consulted for Zosyn dosing.  Plan: Zosyn 3.375gm IV q8h - doses over 4 hours Will f/u micro data, renal function, and pt's clinical condition  Addendum: Changed to Rocephin per pharmacy for UTI Plan: Rocephin 1gm IV q24h Pharmacy will sign off - please reconsult if needed    Height: 5\' 5"  (165.1 cm) Weight: 141 lb (64 kg) IBW/kg (Calculated) : 57  Temp (24hrs), Avg:99.6 F (37.6 C), Min:98 F (36.7 C), Max:101.2 F (38.4 C)   Recent Labs Lab 01/08/17 2126 01/08/17 2318  WBC 8.4  --   CREATININE 1.48*  --   LATICACIDVEN  --  2.20*    Estimated Creatinine Clearance: 28.2 mL/min (A) (by C-G formula based on SCr of 1.48 mg/dL (H)).    No Known Allergies  Antimicrobials this admission: 6/14 Rocephin >> 6/14 Zosyn x1  6/14 Vanc x1  Microbiology results: 6/13 BCx x2:  6/14 UCx:   6/14 MRSA PCR:   Thank you for allowing pharmacy to be a part of this patient's care.  Sherlon Handing, PharmD, BCPS Clinical pharmacist, pager [phone] 01/09/2017 1:17 AM

## 2017-01-09 NOTE — Progress Notes (Signed)
PT Cancellation Note  Patient Details Name: [name] MRN: [id number] DOB: February 09, 1936   Cancelled Treatment:    Reason Eval/Treat Not Completed: Other (comment).  Per RN, not quite medically ready to start PT today, check back tomorrow.   Thanks,    Barbarann Ehlers. [place], [place], DPT [phone]   01/09/2017, 12:08 PM

## 2017-01-09 NOTE — H&P (Addendum)
[name] NWG:[id number] DOB: September 16, 1935 DOA: 01/08/2017     PCP: Nolene Ebbs, MD   Outpatient Specialists: none  Patient coming from:   home Lives  Alone but  Family nearby    Chief Complaint: abdominal pain fever.   HPI: [name] is a 81 y.o. female with medical history significant of HTN, diabetes mellitus type 2 chronic bronchitis, anemia, GERD, hyperlipidemia    Presented with 1 week  history of nausea vomiting and generalized fatigue to feeling lightheaded patient was worried that she has eaten something bad her heart rate is   has been elevated up to 140s. She has been having intermittent left sided chest pain worse with exertion. She reports feeling lightheaded when she stands up. Curretnly chest pain free.  She's been having lower abdominal pain no diarrhea although she has been feeling constipated and she has not had any dysuria but she feels that she may have been some afebrile. She is chronically short of breath and notes that her baseline she uses nebulizers twice a day. She reports no BM for the past 3 days  reports frequent UTI Regarding pertinent Chronic problems: Diabetes mellitus on Januvia Patient has history of rheumatoid arthritis she is on chronic steroids and plaquenil  IN ER:  Temp (24hrs), Avg:99.6 F (37.6 C), Min:98 F (36.7 C), Max:101.2 F (38.4 C)      on arrival  ED Triage Vitals  Enc Vitals Group     BP 01/08/17 2122 109/78     Pulse Rate 01/08/17 2122 (S) (!) 136     Resp 01/08/17 2233 (!) 24     Temp 01/08/17 2122 98 F (36.7 C)     Temp Source 01/08/17 2122 Oral     SpO2 01/08/17 2122 97 %     Weight 01/08/17 2122 141 lb (64 kg)     Height 01/08/17 2122 5\' 5"  (1.651 m)     Head Circumference --      Peak Flow --      Pain Score 01/08/17 2122 9     Pain Loc --      Pain Edu? --      Excl. in [place]? --    rr 18, 100% on 2 L heart rate down to 121 BP 124/63  Patient meeting sepsis criteria Because of 2.2 Troponin  0.01 Na  141 K 3.9 Cr 1.48 up from baseline of 1.14 WBC 8.4 hemoglobin 15 CT abdomen nonacute status post cholecystectomy she has calcified masslike unchanged from 2007 Chest x-ray nonacute Following Medications were ordered in ER: Medications  vancomycin (VANCOCIN) IVPB 1000 mg/200 mL premix (not administered)  ondansetron (ZOFRAN) injection 4 mg (4 mg Intravenous Given 01/08/17 2154)  sodium chloride 0.9 % bolus 1,000 mL (1,000 mLs Intravenous New Bag/Given 01/08/17 2233)  iopamidol (ISOVUE-300) 61 % injection (100 mLs  Contrast Given 01/08/17 2243)  piperacillin-tazobactam (ZOSYN) IVPB 3.375 g (0 g Intravenous Stopped 01/09/17 0023)  acetaminophen (TYLENOL) tablet 1,000 mg (1,000 mg Oral Given 01/09/17 0020)     Hospitalist was called for admission for Sepsis secondary to UTI  Review of Systems:    Pertinent positives include:  Fevers, chills, fatigue, abdominal pain, nausea, vomiting, constipation  Constitutional:  No weight loss, night sweats, weight loss  HEENT:  No headaches, Difficulty swallowing,Tooth/dental problems,Sore throat,  No sneezing, itching, ear ache, nasal congestion, post nasal drip,  Cardio-vascular:  No chest pain, Orthopnea, PND, anasarca, dizziness, palpitations.no Bilateral lower extremity swelling  GI:  No heartburn, indigestion,  diarrhea, change in bowel habits, loss of appetite, melena, blood in stool, hematemesis Resp:  no shortness of breath at rest. No dyspnea on exertion, No excess mucus, no productive cough, No non-productive cough, No coughing up of blood.No change in color of mucus.No wheezing. Skin:  no rash or lesions. No jaundice GU:  no dysuria, change in color of urine, no urgency or frequency. No straining to urinate.  No flank pain.  Musculoskeletal:  No joint pain or no joint swelling. No decreased range of motion. No back pain.  Psych:  No change in mood or affect. No depression or anxiety. No memory loss.  Neuro: no localizing neurological  complaints, no tingling, no weakness, no double vision, no gait abnormality, no slurred speech, no confusion  As per HPI otherwise 10 point review of systems negative.   Past Medical History: Past Medical History:  Diagnosis Date  . Anemia    "when I was young"  . Anginal pain ([place])   . Arthritis    "all over my body"   . Chronic bronchitis ([place])    "get it just about q yr" (06/14/2015)  . Exertional shortness of breath   . Frequent UTI    "recently" (06/14/2015)  . GERD (gastroesophageal reflux disease)   . Headache(784.0)    "probably every other day " (06/14/2015)  . High cholesterol   . History of blood transfusion ~ 1954   "related to my periods"  . Hypertension   . OSA (obstructive sleep apnea)    "I think I'm using a BiPAP" (06/14/2015)  . Type II diabetes mellitus ([place])    Past Surgical History:  Procedure Laterality Date  . APPENDECTOMY  1969  . BACK SURGERY    . [place]  . FRACTURE SURGERY    . LAPAROSCOPIC CHOLECYSTECTOMY  1990's  . LUMBAR DISC SURGERY     "slipped disc" (12/11/2012)  . ORIF SHOULDER FRACTURE Left ~ 2009   "put cement down in it"   . TONSILLECTOMY  1950's?  Marland Kitchen VAGINAL HYSTERECTOMY  ~ 1973     Social History:  Ambulatory    independently      reports that she has never smoked. She has never used smokeless tobacco. She reports that she drinks alcohol. She reports that she does not use drugs.  Allergies:  No Known Allergies   Family History:   Family History  Problem Relation Age of Onset  . Hypertension Mother   . Heart attack Father   . Diabetes Mellitus II Sister   . Hypertension Sister   . Cancer - Other Sister     Medications: Prior to Admission medications   Medication Sig Start Date End Date Taking? Authorizing Provider  Alum & Mag Hydroxide-Simeth (MAGIC MOUTHWASH W/LIDOCAINE) SOLN Take 5 mLs by mouth 3 (three) times daily as needed for mouth pain. 03/14/15  Yes Szekalski, Kaitlyn, PA-C  aspirin  EC 81 MG tablet Take 1 tablet (81 mg total) by mouth daily. 04/09/14  Yes Debbe Odea, MD  atorvastatin (LIPITOR) 40 MG tablet Take 40 mg by mouth at bedtime. 12/19/16  Yes [provider]  diazepam (VALIUM) 5 MG tablet Take 5 mg by mouth daily as needed for anxiety.   Yes [provider]  hydroxychloroquine (PLAQUENIL) 200 MG tablet Take 200 mg by mouth 2 (two) times daily. 05/15/15  Yes [provider]  IBU 800 MG tablet 800 mg at bedtime as needed for pain. 01/04/17  Yes [provider]  JANUVIA 100 MG tablet Take 100 mg by mouth daily.  01/11/15  Yes [provider]  losartan (COZAAR) 25 MG tablet Take 25 mg by mouth daily. 12/19/16  Yes [provider]  metFORMIN (GLUCOPHAGE) 500 MG tablet Take 500 mg by mouth 2 (two) times daily. 12/19/16  Yes [provider]  methylPREDNISolone (MEDROL) 4 MG tablet Take 2-4 mg by mouth every morning.  05/21/15  Yes [provider]  omeprazole (PRILOSEC) 20 MG capsule Take 20 mg by mouth 2 (two) times daily. 12/17/16  Yes [provider]  traMADol (ULTRAM) 50 MG tablet Take 50 mg by mouth 2 (two) times daily as needed for pain.  05/04/12  Yes Barbara Cower, MD  docusate sodium (COLACE) 100 MG capsule Take 1 capsule (100 mg total) by mouth 2 (two) times daily. Patient not taking: Reported on 10/06/2015 06/17/15   Reyne Dumas, MD  feeding supplement, ENSURE ENLIVE, (ENSURE ENLIVE) LIQD Take 237 mLs by mouth 2 (two) times daily between meals. Patient not taking: Reported on 10/06/2015 06/17/15   Reyne Dumas, MD  hydrocortisone (CORTEF) 10 MG tablet Take 1 tablet (10 mg total) by mouth 2 (two) times daily. Patient not taking: Reported on 10/06/2015 06/17/15   Reyne Dumas, MD  hydrOXYzine (ATARAX/VISTARIL) 25 MG tablet Take 1 tablet (25 mg total) by mouth every 6 (six) hours. Patient not taking: Reported on 01/08/2017 10/06/15   Veryl Speak, MD  insulin glargine (LANTUS) 100 unit/mL  SOPN Inject 0.05 mLs (5 Units total) into the skin daily at 10 pm. Patient not taking: Reported on 10/06/2015 01/24/15   Elgergawy, Silver Huguenin, MD  metoprolol tartrate (LOPRESSOR) 25 MG tablet Take 0.5 tablets (12.5 mg total) by mouth 2 (two) times daily. Patient not taking: Reported on 10/06/2015 06/17/15   Reyne Dumas, MD  SUMAtriptan (IMITREX) 50 MG tablet Take 1 tablet (50 mg total) by mouth every 2 (two) hours as needed for migraine. Patient not taking: Reported on 10/06/2015 12/15/12   Verlee Monte, MD  traZODone (DESYREL) 100 MG tablet Take 1 tablet (100 mg total) by mouth at bedtime as needed for sleep. Patient not taking: Reported on 10/06/2015 06/17/15   Reyne Dumas, MD    Physical Exam: Patient Vitals for the past 24 hrs:  BP Temp Temp src Pulse Resp SpO2 Height Weight  01/09/17 0015 - - - (!) 123 18 100 % - -  01/09/17 0000 124/63 - - - (!) 26 - - -  01/08/17 2345 (!) 127/104 - - (!) 110 (!) 29 100 % - -  01/08/17 2330 129/85 - - (!) 110 (!) 25 100 % - -  01/08/17 2310 122/72 (!) 101.2 F (38.4 C) Rectal (!) 115 (!) 22 99 % - -  01/08/17 2245 119/63 - - (!) 111 17 95 % - -  01/08/17 2243 - - - (!) 107 (!) 23 96 % - -  01/08/17 2233 106/70 - - (!) 126 (!) 24 92 % - -  01/08/17 2122 109/78 98 F (36.7 C) Oral (!) 136 - 97 % 5\' 5"  (1.651 m) 64 kg (141 lb)    1. General:  in No Acute distress 2. Psychological: Alert and   Oriented 3. Head/ENT:  Dry Mucous Membranes                          Head Non traumatic, neck supple  Poor Dentition 4. SKIN:  decreased Skin turgor,  Skin clean Dry and intact no rash 5. Heart: Regular rate and rhythm no  Murmur, Rub or gallop 6. Lungs:  o wheezes or crackles   7. Abdomen: Soft, suprapubic tenderness, Non distended 8. Lower extremities: no clubbing, cyanosis, or edema 9. Neurologically Grossly intact, moving all 4 extremities equally   10. MSK: Normal range of motion   body mass index is 23.46 kg/m.  Labs on  Admission:   Labs on Admission: I have personally reviewed following labs and imaging studies  CBC:  Recent Labs Lab 01/08/17 2126  WBC 8.4  HGB 15.0  HCT 48.1*  MCV 89.2  PLT 235   Basic Metabolic Panel:  Recent Labs Lab 01/08/17 2126  NA 141  K 3.9  CL 104  CO2 24  GLUCOSE 226*  BUN 22*  CREATININE 1.48*  CALCIUM 9.4   GFR: Estimated Creatinine Clearance: 28.2 mL/min (A) (by C-G formula based on SCr of 1.48 mg/dL (H)). Liver Function Tests: No results for input(s): AST, ALT, ALKPHOS, BILITOT, PROT, ALBUMIN in the last 168 hours. No results for input(s): LIPASE, AMYLASE in the last 168 hours. No results for input(s): AMMONIA in the last 168 hours. Coagulation Profile: No results for input(s): INR, PROTIME in the last 168 hours. Cardiac Enzymes: No results for input(s): CKTOTAL, CKMB, CKMBINDEX, TROPONINI in the last 168 hours. BNP (last 3 results) No results for input(s): PROBNP in the last 8760 hours. HbA1C: No results for input(s): HGBA1C in the last 72 hours. CBG: No results for input(s): GLUCAP in the last 168 hours. Lipid Profile: No results for input(s): CHOL, HDL, LDLCALC, TRIG, CHOLHDL, LDLDIRECT in the last 72 hours. Thyroid Function Tests: No results for input(s): TSH, T4TOTAL, FREET4, T3FREE, THYROIDAB in the last 72 hours. Anemia Panel: No results for input(s): VITAMINB12, FOLATE, FERRITIN, TIBC, IRON, RETICCTPCT in the last 72 hours. Urine analysis:    Component Value Date/Time   COLORURINE STRAW (A) 01/08/2017 2351   APPEARANCEUR CLEAR 01/08/2017 2351   LABSPEC >1.046 (H) 01/08/2017 2351   PHURINE 6.0 01/08/2017 2351   GLUCOSEU >=500 (A) 01/08/2017 2351   HGBUR NEGATIVE 01/08/2017 2351   BILIRUBINUR NEGATIVE 01/08/2017 2351   KETONESUR 5 (A) 01/08/2017 2351   PROTEINUR NEGATIVE 01/08/2017 2351   UROBILINOGEN 1.0 03/10/2015 1459   NITRITE POSITIVE (A) 01/08/2017 2351   LEUKOCYTESUR SMALL (A) 01/08/2017 2351   Sepsis  Labs: @LABRCNTIP (procalcitonin:4,lacticidven:4) )No results found for this or any previous visit (from the past 240 hour(s)).   UA   evidence of UTI    Lab Results  Component Value Date   HGBA1C 5.9 (H) 06/14/2015    Estimated Creatinine Clearance: 28.2 mL/min (A) (by C-G formula based on SCr of 1.48 mg/dL (H)).  BNP (last 3 results) No results for input(s): PROBNP in the last 8760 hours.   ECG REPORT  Independently reviewed Rate: 135  Rhythm: Sinus tachycardia ST&T Change: Nonspecific likely rate related changes QTC  436  Filed Weights   01/08/17 2122  Weight: 64 kg (141 lb)     Cultures:    Component Value Date/Time   SDES URINE, CATHETERIZED 06/14/2015 1638   SPECREQUEST NONE 06/14/2015 1638   CULT >=100,000 COLONIES/mL ESCHERICHIA COLI 06/14/2015 1638   REPTSTATUS 06/16/2015 FINAL 06/14/2015 1638     Radiological Exams on Admission: Dg Chest 2 View  Result Date: 01/08/2017 CLINICAL DATA:  Acute onset of generalized weakness and tachycardia. Nausea and vomiting. Initial encounter. EXAM: CHEST  2 VIEW COMPARISON:  Chest radiograph performed 10/06/2015 FINDINGS: The lungs are well-aerated and clear. There is no evidence of focal opacification, pleural effusion or pneumothorax. The heart is mildly enlarged. No acute osseous abnormalities are seen. Changes of vertebroplasty are noted at the lower thoracic spine. Clips are noted within the right upper quadrant, reflecting prior cholecystectomy. IMPRESSION: Mild cardiomegaly.  Lungs remain grossly clear. Electronically Signed   By: Garald Balding M.D.   On: 01/08/2017 22:26   Ct Abdomen Pelvis W Contrast  Result Date: 01/08/2017 CLINICAL DATA:  Generalized body weakness, nausea and vomiting EXAM: CT ABDOMEN AND PELVIS WITH CONTRAST TECHNIQUE: Multidetector CT imaging of the abdomen and pelvis was performed using the standard protocol following bolus administration of intravenous contrast. CONTRAST:  121mL ISOVUE-300 IOPAMIDOL  (ISOVUE-300) INJECTION 61% COMPARISON:  CT exam from 05/24/2006 and 01/29/2015 FINDINGS: Lower chest: Normal size cardiac chambers. Scarring and atelectasis at the right lung base. No effusion or pneumothorax. No pericardial effusion. Hepatobiliary: Cholecystectomy. No space-occupying mass of the liver. No choledocholithiasis. Pancreas: No space-occupying mass of the pancreas. Pancreatic duct is mildly dilated up to 4-5 mm without obstructing source noted and may reflect chronic changes secondary to cholecystectomy. This may represent some ectasia. Spleen: Normal in size without focal abnormality. Adrenals/Urinary Tract: Normal bilateral adrenal glands and kidneys. No obstructive uropathy. Bladder is unremarkable. Stomach/Bowel: Physiologically distended stomach. Interposed between the left hepatic lobe and stomach is a calcified mass measuring 2.1 x 2.2 cm, series 3, image 15 which dates back to 2007 but is densely calcified and likely to reflect a benign finding possibly a gastric diverticulum with calcification seen within. There is normal small bowel rotation without obstruction or inflammation. The patient is status post hysterectomy. No large bowel abnormality is noted. Vascular/Lymphatic: Aortic atherosclerosis without aneurysm. No lymphadenopathy. Reproductive: Status post hysterectomy.  No adnexal mass. Other: No ascites.  No hernia. Musculoskeletal: Vertebral augmentation at T11 and T12 with slight interval progression of compression fracture of L4 involving the inferior endplate since 1610. Vacuum disc with worsening of degenerative disc disease also noted at L2-3. IMPRESSION: 1. No acute intraabdominal nor pelvic abnormality. 2. Status post cholecystectomy with chronic mild ductal dilatation the pancreas and common bile duct likely representing reservoir effect from prior surgery. 3. Calcified masslike abnormality interposed between the stomach and left hepatic lobe measuring 2.2 x 2.1 cm, dating back to  2007 consistent with a benign finding possibly a gastric diverticulum now containing calcification within. 4. Aortic atherosclerosis. 5. Lumbar spondylosis with vertebral augmentation at T11 and T12. Worsening of degenerative disc disease at L2-3 and inferior endplate compression of L4 since 2016 comparison. Electronically Signed   By: Ashley Royalty M.D.   On: 01/08/2017 23:27    Chart has been reviewed    Assessment/Plan   81 y.o. female with medical history significant of HTN, diabetes mellitus type 2 chronic bronchitis, anemia, GERD, hyperlipidemia  Admitted for  Sepsis secondary to UTI   Present on Admission: . Sepsis ([place]) Admit per Sepsis protocol likely source being  UTI   - rehydrate    -Cover with zosyn reviewed prior sensitivities  given her recurrent UTIs and severity of illness will call broadly until urine culture has been obtained this was discussed with [facility] M in for coronary recommendation  -  obtain blood cultures  - Obtain serial lactic acid  - Obtain procalcitonin level  - Admit and monitor vital signs closely  -  PCCM is aware  Sepsis - Repeat Assessment  Performed  at:    [facility]     Blood pressure 124/63, pulse (!) 123, temperature (!) 101.2 F (38.4 C), temperature source Rectal, resp. rate 18, height 5\' 5"  (1.651 m), weight 64 kg (141 lb), SpO2 100 %.  Heart:     Tachycardic  Lungs:    CTA  Capillary Refill:   <2 sec  Peripheral Pulse:   Radial pulse palpable  Skin:     Normal Color   . Acute lower UTI  - be able to cover with Zosyn for now await results of urine culture . Chronic diastolic heart failure, NYHA class 1 (HCC) stable given tachycardia and AKI  will hydrate monitor for any evidence fluid overload . Dehydration, moderate rehydrate and monitor check orthostatics in a.m. Marland Kitchen Elevated lactic acid level continue to cycle lactic acid and rehydrate . Rheumatoid arthritis (HCC) continue Plaquenil, order hydrocortisone IV while critically  ill  Diabetes mellitus -  hold Januvia  order a sliding scale . Constipation  - order bowel regimen  AKI most likely secondary to dehydration secondary to nausea and vomiting will rehydrate and follow kidney function obtain urine electrolytes  Chest pain given risk factors will cycle cardiac enzymes monitored and her telemetry obtain echogram likely setting of severe tachycardia likely benefit from cardiology evaluation when acute illness resolves.  Chronic bronchitis COPD continue home medications Other plan as per orders.  DVT prophylaxis:    Lovenox     Code Status:  FULL CODE  as per patient    Family Communication:   Family not  at  Bedside    Disposition Plan:    To home once workup is complete and patient is stable                                                 Consults called: discussed with PCCM   Admission status:  obs   Level of care     SDU    Medical risk high secondary to sepsis and unstable vital signs   I have spent a total of 44min on this admission  extra time was spent to discuss case with consultants  Audree Schrecengost 01/09/2017, 1:20 AM    [facility]  Pager [phone]   after 2 AM please page floor coverage PA If 7AM-7PM, please contact the day team taking care of the patient  [email or web address]  Password [email or web address]

## 2017-01-09 NOTE — Progress Notes (Signed)
  Echocardiogram 2D Echocardiogram has been performed.  [name] 01/09/2017, 10:34 AM

## 2017-01-09 NOTE — Progress Notes (Signed)
Pt light-headed upon standing, utilized BSC near bedside. Void and BM, watery/loose. Pt declined assistance with peri care. Pt wiped from rectum to front. Pt educated on peri care and issue with recurrent UTI. Pt stated she "didn't know that and thank you for telling me". Will continue to educate importance of good hygiene.   Pt also has very poor dental hygiene with sores along her top and bottom gum lines. Missing all top teeth. Reinforced good oral care.

## 2017-01-09 NOTE — Progress Notes (Signed)
CRITICAL VALUE ALERT  Critical Value:  Lactic 2.7  Date & Time Notied:  [id number]  Provider Notified: Sherral Hammers, MD  Orders Received/Actions taken: fluids currently running at 41ml.hr, patient has received total of 2.5 L NS [place] from previous RN/shifts, awaiting new orders

## 2017-01-09 NOTE — Care Management Obs Status (Signed)
[place] NOTIFICATION   Patient Details  Name: Allessandra Bernardi MRN: [id number] Date of Birth: May 24, 1936    Medicare Observation Status Notification Given:       Zenon Mayo, RN 01/09/2017, 5:33 PM

## 2017-01-10 DIAGNOSIS — J449 Chronic obstructive pulmonary disease, unspecified: Secondary | ICD-10-CM

## 2017-01-10 DIAGNOSIS — N179 Acute kidney failure, unspecified: Secondary | ICD-10-CM

## 2017-01-10 DIAGNOSIS — M05719 Rheumatoid arthritis with rheumatoid factor of unspecified shoulder without organ or systems involvement: Secondary | ICD-10-CM

## 2017-01-10 DIAGNOSIS — E1121 Type 2 diabetes mellitus with diabetic nephropathy: Secondary | ICD-10-CM

## 2017-01-10 DIAGNOSIS — I5032 Chronic diastolic (congestive) heart failure: Secondary | ICD-10-CM

## 2017-01-10 DIAGNOSIS — R7881 Bacteremia: Secondary | ICD-10-CM

## 2017-01-10 DIAGNOSIS — N390 Urinary tract infection, site not specified: Secondary | ICD-10-CM

## 2017-01-10 DIAGNOSIS — E1165 Type 2 diabetes mellitus with hyperglycemia: Secondary | ICD-10-CM

## 2017-01-10 LAB — BLOOD CULTURE ID PANEL (REFLEXED)
ACINETOBACTER BAUMANNII: NOT DETECTED
CANDIDA ALBICANS: NOT DETECTED
CANDIDA GLABRATA: NOT DETECTED
CANDIDA KRUSEI: NOT DETECTED
Candida parapsilosis: NOT DETECTED
Candida tropicalis: NOT DETECTED
ENTEROBACTER CLOACAE COMPLEX: NOT DETECTED
ENTEROBACTERIACEAE SPECIES: NOT DETECTED
ENTEROCOCCUS SPECIES: NOT DETECTED
ESCHERICHIA COLI: NOT DETECTED
Haemophilus influenzae: NOT DETECTED
KLEBSIELLA OXYTOCA: NOT DETECTED
Klebsiella pneumoniae: NOT DETECTED
LISTERIA MONOCYTOGENES: NOT DETECTED
Methicillin resistance: NOT DETECTED
Neisseria meningitidis: NOT DETECTED
PSEUDOMONAS AERUGINOSA: NOT DETECTED
Proteus species: NOT DETECTED
STREPTOCOCCUS AGALACTIAE: NOT DETECTED
STREPTOCOCCUS PNEUMONIAE: NOT DETECTED
STREPTOCOCCUS PYOGENES: NOT DETECTED
Serratia marcescens: NOT DETECTED
Staphylococcus aureus (BCID): NOT DETECTED
Staphylococcus species: DETECTED — AB
Streptococcus species: NOT DETECTED

## 2017-01-10 LAB — CBC WITH DIFFERENTIAL/PLATELET
Basophils Absolute: 0 10*3/uL (ref 0.0–0.1)
Basophils Relative: 0 %
Eosinophils Absolute: 0 10*3/uL (ref 0.0–0.7)
Eosinophils Relative: 0 %
HEMATOCRIT: 35.9 % — AB (ref 36.0–46.0)
HEMOGLOBIN: 11.1 g/dL — AB (ref 12.0–15.0)
LYMPHS ABS: 2.3 10*3/uL (ref 0.7–4.0)
Lymphocytes Relative: 25 %
MCH: 27.5 pg (ref 26.0–34.0)
MCHC: 30.9 g/dL (ref 30.0–36.0)
MCV: 88.9 fL (ref 78.0–100.0)
MONOS PCT: 4 %
Monocytes Absolute: 0.4 10*3/uL (ref 0.1–1.0)
NEUTROS ABS: 6.5 10*3/uL (ref 1.7–7.7)
Neutrophils Relative %: 71 %
Platelets: 192 10*3/uL (ref 150–400)
RBC: 4.04 MIL/uL (ref 3.87–5.11)
RDW: 13.6 % (ref 11.5–15.5)
WBC: 9.2 10*3/uL (ref 4.0–10.5)

## 2017-01-10 LAB — GLUCOSE, CAPILLARY
GLUCOSE-CAPILLARY: 143 mg/dL — AB (ref 65–99)
Glucose-Capillary: 187 mg/dL — ABNORMAL HIGH (ref 65–99)
Glucose-Capillary: 188 mg/dL — ABNORMAL HIGH (ref 65–99)
Glucose-Capillary: 137 mg/dL — ABNORMAL HIGH (ref 65–99)

## 2017-01-10 LAB — BASIC METABOLIC PANEL
Anion gap: 7 (ref 5–15)
BUN: 7 mg/dL (ref 6–20)
CALCIUM: 7.9 mg/dL — AB (ref 8.9–10.3)
CO2: 26 mmol/L (ref 22–32)
Chloride: 108 mmol/L (ref 101–111)
Creatinine, Ser: 0.71 mg/dL (ref 0.44–1.00)
GFR calc Af Amer: >60 mL/min (ref >60)
GFR calc non Af Amer: >60 mL/min (ref >60)
GLUCOSE: 185 mg/dL — AB (ref 65–99)
POTASSIUM: 4.2 mmol/L (ref 3.5–5.1)
Sodium: 141 mmol/L (ref 135–145)

## 2017-01-10 LAB — HEMOGLOBIN A1C
Hgb A1c MFr Bld: 10.1 % — ABNORMAL HIGH (ref 4.8–5.6)
Mean Plasma Glucose: 243 mg/dL

## 2017-01-10 LAB — MAGNESIUM: Magnesium: 1.7 mg/dL (ref 1.7–2.4)

## 2017-01-10 MED ORDER — LINAGLIPTIN 5 MG PO TABS
5.0000 mg | ORAL_TABLET | Freq: Every day | ORAL | Status: DC
  Administered 2017-01-10 – 2017-01-11 (×2): 5 mg via ORAL
  Filled 2017-01-10 (×2): qty 1

## 2017-01-10 MED ORDER — DEXTROSE 5 % IV SOLN
2.0000 g | INTRAVENOUS | Status: DC
  Administered 2017-01-10 – 2017-01-11 (×2): 2 g via INTRAVENOUS
  Filled 2017-01-10 (×2): qty 2

## 2017-01-10 MED ORDER — METHYLPREDNISOLONE 4 MG PO TABS
4.0000 mg | ORAL_TABLET | Freq: Every day | ORAL | Status: DC
  Administered 2017-01-11: 4 mg via ORAL
  Filled 2017-01-10: qty 1

## 2017-01-10 MED ORDER — CEFAZOLIN SODIUM-DEXTROSE 1-4 GM/50ML-% IV SOLN
1.0000 g | Freq: Three times a day (TID) | INTRAVENOUS | Status: DC
  Filled 2017-01-10: qty 50

## 2017-01-10 MED ORDER — CEFAZOLIN SODIUM-DEXTROSE 2-4 GM/100ML-% IV SOLN
2.0000 g | Freq: Three times a day (TID) | INTRAVENOUS | Status: DC
  Filled 2017-01-10: qty 100

## 2017-01-10 MED ORDER — METHYLPREDNISOLONE 2 MG PO TABS
2.0000 mg | ORAL_TABLET | Freq: Every day | ORAL | Status: DC
Start: 2017-01-10 — End: 2017-01-10

## 2017-01-10 MED ORDER — ENOXAPARIN SODIUM 40 MG/0.4ML ~~LOC~~ SOLN
40.0000 mg | SUBCUTANEOUS | Status: DC
  Administered 2017-01-11: 40 mg via SUBCUTANEOUS
  Filled 2017-01-10: qty 0.4

## 2017-01-10 MED ORDER — INSULIN GLARGINE 100 UNIT/ML ~~LOC~~ SOLN
6.0000 [IU] | Freq: Every day | SUBCUTANEOUS | Status: DC
  Administered 2017-01-10: 6 [IU] via SUBCUTANEOUS
  Filled 2017-01-10 (×2): qty 0.06

## 2017-01-10 MED ORDER — HYDROCORTISONE 10 MG PO TABS
10.0000 mg | ORAL_TABLET | Freq: Two times a day (BID) | ORAL | Status: DC
  Filled 2017-01-10: qty 1

## 2017-01-10 NOTE — Progress Notes (Addendum)
PHARMACY - PHYSICIAN COMMUNICATION CRITICAL VALUE ALERT - BLOOD CULTURE IDENTIFICATION (BCID)  Results for orders placed or performed during the hospital encounter of 01/08/17  Blood Culture ID Panel (Reflexed) (Collected: 01/08/2017 11:51 PM)  Result Value Ref Range   Enterococcus species NOT DETECTED NOT DETECTED   Listeria monocytogenes NOT DETECTED NOT DETECTED   Staphylococcus species DETECTED (A) NOT DETECTED   Staphylococcus aureus NOT DETECTED NOT DETECTED   Methicillin resistance NOT DETECTED NOT DETECTED   Streptococcus species NOT DETECTED NOT DETECTED   Streptococcus agalactiae NOT DETECTED NOT DETECTED   Streptococcus pneumoniae NOT DETECTED NOT DETECTED   Streptococcus pyogenes NOT DETECTED NOT DETECTED   Acinetobacter baumannii NOT DETECTED NOT DETECTED   Enterobacteriaceae species NOT DETECTED NOT DETECTED   Enterobacter cloacae complex NOT DETECTED NOT DETECTED   Escherichia coli NOT DETECTED NOT DETECTED   Klebsiella oxytoca NOT DETECTED NOT DETECTED   Klebsiella pneumoniae NOT DETECTED NOT DETECTED   Proteus species NOT DETECTED NOT DETECTED   Serratia marcescens NOT DETECTED NOT DETECTED   Haemophilus influenzae NOT DETECTED NOT DETECTED   Neisseria meningitidis NOT DETECTED NOT DETECTED   Pseudomonas aeruginosa NOT DETECTED NOT DETECTED   Candida albicans NOT DETECTED NOT DETECTED   Candida glabrata NOT DETECTED NOT DETECTED   Candida krusei NOT DETECTED NOT DETECTED   Candida parapsilosis NOT DETECTED NOT DETECTED   Candida tropicalis NOT DETECTED NOT DETECTED    Name of physician (or Provider) Contacted: Chaney Malling  Changes to prescribed antibiotics required: Change Rocephin to 2g.  Wynona Neat, PharmD, BCPS  01/10/2017  2:38 AM

## 2017-01-10 NOTE — Evaluation (Signed)
Occupational Therapy Evaluation Patient Details Name: [name] MRN: [id number] DOB: 1935-08-20 Today's Date: 01/10/2017    History of Present Illness 81 y.o. female admitted on 01/08/17 for abdominal pain, N/V/D and fever.  Pt dx with sepsis due to UTI and possibly oral infection due to bad teech.     Clinical Impression   Pt admitted with above. She demonstrates the below listed deficits and will benefit from continued OT to maximize safety and independence with BADLs.  Pt presents to OT with generalized weakness, decreased activity tolerance, and impaired balance.  She requires min guard assist with ADLs.  She lives alone, but has niece and nephew who check on her several times/day and assist as needed.  Feel she would benefit from [facility] and tub transfer bench.  Will follow acutely.       Follow Up Recommendations  Home health OT;Supervision/Assistance - 24 hour    Equipment Recommendations  Tub/shower bench    Recommendations for Other Services       Precautions / Restrictions Precautions Precautions: Fall      Mobility Bed Mobility               General bed mobility comments: Pt up in chair   Transfers Overall transfer level: Needs assistance Equipment used: Rolling walker (2 wheeled) Transfers: Sit to/from [employer] Transfers Sit to Stand: Min guard Stand pivot transfers: Min guard            Balance Overall balance assessment: Needs assistance Sitting-balance support: Feet supported;Bilateral upper extremity supported Sitting balance-Leahy Scale: Good     Standing balance support: No upper extremity supported Standing balance-Leahy Scale: Fair                             ADL either performed or assessed with clinical judgement   ADL Overall ADL's : Needs assistance/impaired Eating/Feeding: Independent   Grooming: Wash/dry hands;Wash/dry face;Oral care;Standing;Min guard   Upper Body Bathing: Standing;Min guard   Lower  Body Bathing: Sit to/from stand;Min guard   Upper Body Dressing : Set up;Sitting   Lower Body Dressing: Sit to/from stand;Min guard   Toilet Transfer: Ambulation;RW;Min guard   Toileting- Clothing Manipulation and Hygiene: Sit to/from stand;Min guard   Tub/ Banker: Min guard;Tub transfer;Rolling walker Tub/Shower Transfer Details (indicate cue type and reason): discussed option of tub transfer bench and discussed with CM  Functional mobility during ADLs: Supervision/safety;Rolling walker General ADL Comments: Simulated shower in standing, and pt able to perform without LOB - min guard for safety       Vision         Perception     Praxis      Pertinent Vitals/Pain Pain Assessment: Faces Faces Pain Scale: No hurt     Hand Dominance Right   Extremity/Trunk Assessment Upper Extremity Assessment Upper Extremity Assessment: Generalized weakness (arthritic changes bil. hands )   Lower Extremity Assessment Lower Extremity Assessment: Defer to PT evaluation   Cervical / Trunk Assessment Cervical / Trunk Assessment: Other exceptions Cervical / Trunk Exceptions: h/o low back surgery.    Communication Communication Communication: No difficulties   Cognition Arousal/Alertness: Awake/alert Behavior During Therapy: WFL for tasks assessed/performed Overall Cognitive Status: No family/caregiver present to determine baseline cognitive functioning                                 General Comments: Pt appears  WFL for basic ADLs - she is likely at her baseline    General Comments       Exercises     Shoulder Instructions      Home Living Family/patient expects to be discharged to:: Private residence Living Arrangements: Alone Available Help at Discharge: Family;Other (Comment) (nephew and niece come over) Type of Home: Apartment Home Access: Stairs to enter [employer] of Steps: 2 (3 steps at the street with a rail) Entrance Stairs-Rails:  Right Home Layout: One level     Bathroom Shower/Tub: Teacher, early years/pre: Standard     Home Equipment: Environmental consultant - 2 wheels   Additional Comments: h/o falls.  Pt reports her niece and nephew check on her frequently       Prior Functioning/Environment Level of Independence: Needs assistance  Gait / Transfers Assistance Needed: uses RW at times ADL's / Homemaking Assistance Needed: Pt reports niece assists with grocery shopping and intermittently assists with tub transfers             OT Problem List: Decreased strength;Decreased activity tolerance;Impaired balance (sitting and/or standing);Decreased safety awareness;Decreased knowledge of use of DME or AE      OT Treatment/Interventions: Self-care/ADL training;DME and/or AE instruction;Therapeutic activities;Patient/family education;Balance training    OT Goals(Current goals can be found in the care plan section) Acute Rehab OT Goals Patient Stated Goal: To get stronger  OT Goal Formulation: With patient Time For Goal Achievement: 01/24/17 Potential to Achieve Goals: Good ADL Goals Pt Will Perform Grooming: with modified independence;standing Pt Will Perform Upper Body Bathing: sitting;standing Pt Will Perform Lower Body Bathing: with modified independence;sit to/from stand Pt Will Perform Upper Body Dressing: with modified independence;sitting Pt Will Perform Lower Body Dressing: with modified independence;sit to/from stand Pt Will Transfer to Toilet: with modified independence;ambulating;regular height toilet;bedside commode;grab bars Pt Will Perform Toileting - Clothing Manipulation and hygiene: with modified independence;sit to/from stand Pt Will Perform Tub/Shower Transfer: Tub transfer;with supervision;ambulating;tub bench;rolling walker  OT Frequency: Min 2X/week   Barriers to D/C:            Co-evaluation              AM-PAC PT "6 Clicks" Daily Activity     Outcome Measure Help from  another person eating meals?: None Help from another person taking care of personal grooming?: A Little Help from another person toileting, which includes using toliet, bedpan, or urinal?: A Little Help from another person bathing (including washing, rinsing, drying)?: A Little Help from another person to put on and taking off regular upper body clothing?: A Little Help from another person to put on and taking off regular lower body clothing?: A Little 6 Click Score: 19   End of Session Nurse Communication: Mobility status  Activity Tolerance: Patient tolerated treatment well Patient left: in chair;with call bell/phone within reach;with chair alarm set  OT Visit Diagnosis: Unsteadiness on feet (R26.81)                Time: 0370-4888 OT Time Calculation (min): 17 min Charges:  OT Evaluation $OT Eval Low Complexity: 1 Procedure G-Codes:     Lucille Passy, OTR/L [phone]   Ladonna Snide, Rusell Meneely M 01/10/2017, 2:12 PM

## 2017-01-10 NOTE — Care Management Note (Signed)
Case Management Note  Patient Details  Name: [name] MRN: [id number] Date of Birth: 06-14-1936  Subjective/Objective:  From home alone, presents with Sepsis, ? Uti.  She states her niece and nephew, Gaylan Gerold and [place] [employer] checks on her every day, and she has very good neighbors who check on her also.  She would like to have a 3 n 1 to go over her commode. She states she does not want [facility] services.  NCM informed her , will follow along to see what her needs will before she goes home.  Await pt/ot eval.    Per pt/ot eval rec HHPT/HHOT, patient chose [facility], referral made to [facility] with [facility] , soc will begin 24-48hrs post dc.   PCP  Nolene Ebbs                        Action/Plan: NCM will follow for dc needs.  Expected Discharge Date:                  Expected Discharge Plan:  [place]  In-House Referral:     Discharge planning Services  CM Consult  Post Acute Care Choice:  Durable Medical Equipment, Home Health Choice offered to:  Patient  DME Arranged:  3-N-1 DME Agency:  [place]:  PT, OT [place] Agency:  [facility] ([facility]  Status of Service:  Completed, signed off  If discussed at [employer] of Stay Meetings, dates discussed:    Additional Comments:  Zenon Mayo, RN 01/10/2017, 4:57 PM

## 2017-01-10 NOTE — Progress Notes (Signed)
PROGRESS NOTE    [name]  ASN:[id number] DOB: Jun 28, 1936 DOA: 01/08/2017 PCP: Nolene Ebbs, MD   Brief Narrative:  81 y.o. BF PMHx  DM type 2, chronic bronchitis, OSA (BiPAP?), anemia, GERD, HLD, RA on chronic steroids and plaquenil    Presented with 1 week  history of nausea vomiting and generalized fatigue to feeling lightheaded patient was worried that she has eaten something bad her heart rate is   has been elevated up to 140s. She has been having intermittent left sided chest pain worse with exertion. She reports feeling lightheaded when she stands up. Curretnly chest pain free.  She's been having lower abdominal pain no diarrhea although she has been feeling constipated and she has not had any dysuria but she feels that she may have been some afebrile. She is chronically short of breath and notes that her baseline she uses nebulizers twice a day. She reports no BM for the past 3 days  reports frequent UTI   Subjective: 6/15 A/O x 4 negative CP, negative abdominal pain, negative N/V. Sitting in chair comfortably.     Assessment & Plan:   Active Problems:   Dehydration, moderate   Rheumatoid arthritis (HCC)   Chest pain   DM (diabetes mellitus) (HCC)   Elevated lactic acid level   Chronic diastolic heart failure, NYHA class 1 (HCC)   Acute lower UTI   Sepsis (HCC)   Constipation   COPD (chronic obstructive pulmonary disease) (HCC)   Sepsis UTIpositive GNR/Bacteremia positive coag negative staph (contaminant?) -Complete 5 days antibiotics. Awaiting susceptibility -Patient was also counseled by RN on proper hygiene when having a BM.   Rheumatoid arthritis (HCC)  -continue Plaquenil, 200 mg BID -Transition to Methylprednisolone 2-4 daily   Diabetes mellitus Type 2 uncontrolled with complications -1/93 Hemoglobin A1c= 10.1 -Patient on two PO . Diabetic medications without proper control. Guidelines would indicate patient would need to transition to  insulin -Start Lantus 6 units QHS -In place of Januvia 100 mg daily: Start Tradjenta 5 mg daily  Acute renal failure - most likely secondary to dehydration secondary to nausea and vomiting will rehydrate and follow kidney function obtain urine electrolytes Lab Results  Component Value Date   CREATININE 0.71 01/10/2017   CREATININE 1.29 (H) 01/09/2017   CREATININE 1.48 (H) 01/08/2017  -Resolved with hydration  Chest pain - Troponin 3 negative -CP resolved  Chronic diastolic CHF -Echocardiogram shows mild diastolic dysfunction  Chronic Bronchitis/ COPD  -Currently nonissue.    DVT prophylaxis: Lovenox Code Status: Full Family Communication: None Disposition Plan: Resolution sepsis   Consultants:  None  Procedures/Significant Events:  6/14 Echocardiogram:Left ventricle: moderate LVH. -LVEF = 65%  to 70%.  -(grade 1 diastolic dysfunction).     VENTILATOR SETTINGS: None   Cultures 6/13 blood positive coag negative staph (contaminant?) 6/13 blood pending 6/13 urine positive GNR   Antimicrobials: Anti-infectives    Start     Stop   01/09/17 1000  hydroxychloroquine (PLAQUENIL) tablet 200 mg         01/09/17 0800  piperacillin-tazobactam (ZOSYN) IVPB 3.375 g  Status:  Discontinued     01/09/17 0427   01/09/17 0600  cefTRIAXone (ROCEPHIN) 1 g in dextrose 5 % 50 mL IVPB         01/09/17 0415  cefTRIAXone (ROCEPHIN) 2 g in dextrose 5 % 50 mL IVPB  Status:  Discontinued     01/09/17 0415   01/08/17 2330  piperacillin-tazobactam (ZOSYN) IVPB 3.375 g  01/09/17 0023   01/08/17 2330  vancomycin (VANCOCIN) IVPB 1000 mg/200 mL premix     01/09/17 0132       Devices None  LINES / TUBES:  None    Continuous Infusions: . sodium chloride 125 mL/hr at 01/09/17 1846  . cefTRIAXone (ROCEPHIN)  IV Stopped (01/10/17 0355)     Objective: Vitals:   01/09/17 1533 01/09/17 1939 01/09/17 2309 01/10/17 0326  BP: (!) 146/64 (!) 131/91 128/65 133/80  Pulse:   99 92 93  Resp:  (!) 24 (!) 25 20  Temp: 98.3 F (36.8 C) 98.3 F (36.8 C) 98.3 F (36.8 C) 98.2 F (36.8 C)  TempSrc: Oral Oral Oral Oral  SpO2:  96% 92% 95%  Weight:      Height:        Intake/Output Summary (Last 24 hours) at 01/10/17 0800 Last data filed at 01/10/17 0400  Gross per 24 hour  Intake          2759.67 ml  Output              200 ml  Net          2559.67 ml   Filed Weights   01/08/17 2122 01/09/17 0406  Weight: 141 lb (64 kg) 141 lb 12.1 oz (64.3 kg)    Examination:  General: A/O 4, No acute respiratory distress Eyes: negative scleral hemorrhage, negative anisocoria, negative icterus ENT: Negative Runny nose, negative gingival bleeding, extremely poor dentation multiple teeth fractured off at the gum with obvious rot. Right-sided facial swelling/neck swelling negative pain to palpation Neck:  Negative scars, masses, torticollis, lymphadenopathy, JVD Lungs: Clear to auscultation bilaterally without wheezes or crackles Cardiovascular: Regular rate and rhythm without murmur gallop or rub normal S1 and S2 Abdomen: negative abdominal pain, nondistended, positive soft, bowel sounds, no rebound, no ascites, no appreciable mass Extremities: No significant cyanosis, clubbing, or edema bilateral lower extremities Skin: Negative rashes, lesions, ulcers Psychiatric:  Negative depression, negative anxiety, negative fatigue, negative mania  Central nervous system:  Cranial nerves II through XII intact, tongue/uvula midline, all extremities muscle strength 5/5, sensation intact throughout, f negative dysarthria, negative expressive aphasia, negative receptive aphasia.  .     Data Reviewed: Care during the described time interval was provided by me .  I have reviewed this patient's available data, including medical history, events of note, physical examination, and all test results as part of my evaluation. I have personally reviewed and interpreted all radiology  studies.  CBC:  Recent Labs Lab 01/08/17 2126 01/09/17 0448  WBC 8.4 7.3  HGB 15.0 13.4  HCT 48.1* 42.5  MCV 89.2 89.9  PLT 253 347   Basic Metabolic Panel:  Recent Labs Lab 01/08/17 2126 01/09/17 0448  NA 141 141  K 3.9 4.0  CL 104 104  CO2 24 27  GLUCOSE 226* 144*  BUN 22* 18  CREATININE 1.48* 1.29*  CALCIUM 9.4 8.5*  MG  --  2.3  PHOS  --  3.8   GFR: Estimated Creatinine Clearance: 31 mL/min (A) (by C-G formula based on SCr of 1.29 mg/dL (H)). Liver Function Tests:  Recent Labs Lab 01/09/17 0448  AST 24  ALT 17  ALKPHOS 41  BILITOT 0.6  PROT 6.1*  ALBUMIN 3.1*   No results for input(s): LIPASE, AMYLASE in the last 168 hours. No results for input(s): AMMONIA in the last 168 hours. Coagulation Profile: No results for input(s): INR, PROTIME in the last 168 hours. Cardiac Enzymes:  Recent Labs Lab 01/09/17 0221 01/09/17 0724 01/09/17 1352  TROPONINI <0.03 <0.03 <0.03   BNP (last 3 results) No results for input(s): PROBNP in the last 8760 hours. HbA1C:  Recent Labs  01/09/17 0448  HGBA1C 10.1*   CBG:  Recent Labs Lab 01/09/17 1115 01/09/17 1531 01/09/17 1941 01/09/17 2109 01/10/17 0758  GLUCAP 153* 97 176* 194* 187*   Lipid Profile: No results for input(s): CHOL, HDL, LDLCALC, TRIG, CHOLHDL, LDLDIRECT in the last 72 hours. Thyroid Function Tests:  Recent Labs  01/09/17 0448  TSH 0.214*   Anemia Panel: No results for input(s): VITAMINB12, FOLATE, FERRITIN, TIBC, IRON, RETICCTPCT in the last 72 hours. Urine analysis:    Component Value Date/Time   COLORURINE STRAW (A) 01/08/2017 2351   APPEARANCEUR CLEAR 01/08/2017 2351   LABSPEC >1.046 (H) 01/08/2017 2351   PHURINE 6.0 01/08/2017 2351   GLUCOSEU >=500 (A) 01/08/2017 2351   HGBUR NEGATIVE 01/08/2017 2351   BILIRUBINUR NEGATIVE 01/08/2017 2351   KETONESUR 5 (A) 01/08/2017 2351   PROTEINUR NEGATIVE 01/08/2017 2351   UROBILINOGEN 1.0 03/10/2015 1459   NITRITE POSITIVE (A)  01/08/2017 2351   LEUKOCYTESUR SMALL (A) 01/08/2017 2351   Sepsis Labs: @LABRCNTIP (procalcitonin:4,lacticidven:4)  ) Recent Results (from the past 240 hour(s))  Blood Culture (routine x 2)     Status: None (Preliminary result)   Collection Time: 01/08/17 11:51 PM  Result Value Ref Range Status   Specimen Description BLOOD RIGHT ANTECUBITAL  Final   Special Requests   Final    BOTTLES DRAWN AEROBIC AND ANAEROBIC Blood Culture adequate volume   Culture  Setup Time   Final    GRAM POSITIVE COCCI IN CLUSTERS AEROBIC BOTTLE ONLY Organism ID to follow CRITICAL RESULT CALLED TO, READ BACK BY AND VERIFIED WITH: V.BRYK,PHARMD AT 0217 ON 01/10/17 BY [email or web address]    Culture PENDING  Incomplete   Report Status PENDING  Incomplete  Urine Culture     Status: Abnormal (Preliminary result)   Collection Time: 01/08/17 11:51 PM  Result Value Ref Range Status   Specimen Description URINE, CLEAN CATCH  Final   Special Requests ADDED [id number]  Final   Culture >=100,000 COLONIES/mL GRAM NEGATIVE RODS (A)  Final   Report Status PENDING  Incomplete  Blood Culture ID Panel (Reflexed)     Status: Abnormal   Collection Time: 01/08/17 11:51 PM  Result Value Ref Range Status   Enterococcus species NOT DETECTED NOT DETECTED Final   Listeria monocytogenes NOT DETECTED NOT DETECTED Final   Staphylococcus species DETECTED (A) NOT DETECTED Final    Comment: Methicillin (oxacillin) susceptible coagulase negative staphylococcus. Possible blood culture contaminant (unless isolated from more than one blood culture draw or clinical case suggests pathogenicity). No antibiotic treatment is indicated for blood  culture contaminants. CRITICAL RESULT CALLED TO, READ BACK BY AND VERIFIED WITH: V.BRYK,PHARMD AT 0217 ON 01/10/17 BY [email or web address]    Staphylococcus aureus NOT DETECTED NOT DETECTED Final   Methicillin resistance NOT DETECTED NOT DETECTED Final   Streptococcus species NOT DETECTED NOT DETECTED Final   Streptococcus  agalactiae NOT DETECTED NOT DETECTED Final   Streptococcus pneumoniae NOT DETECTED NOT DETECTED Final   Streptococcus pyogenes NOT DETECTED NOT DETECTED Final   Acinetobacter baumannii NOT DETECTED NOT DETECTED Final   Enterobacteriaceae species NOT DETECTED NOT DETECTED Final   Enterobacter cloacae complex NOT DETECTED NOT DETECTED Final   Escherichia coli NOT DETECTED NOT DETECTED Final   Klebsiella oxytoca NOT DETECTED NOT DETECTED Final   Klebsiella pneumoniae NOT DETECTED  NOT DETECTED Final   Proteus species NOT DETECTED NOT DETECTED Final   Serratia marcescens NOT DETECTED NOT DETECTED Final   Haemophilus influenzae NOT DETECTED NOT DETECTED Final   Neisseria meningitidis NOT DETECTED NOT DETECTED Final   Pseudomonas aeruginosa NOT DETECTED NOT DETECTED Final   Candida albicans NOT DETECTED NOT DETECTED Final   Candida glabrata NOT DETECTED NOT DETECTED Final   Candida krusei NOT DETECTED NOT DETECTED Final   Candida parapsilosis NOT DETECTED NOT DETECTED Final   Candida tropicalis NOT DETECTED NOT DETECTED Final  MRSA PCR Screening     Status: None   Collection Time: 01/09/17 12:47 AM  Result Value Ref Range Status   MRSA by PCR NEGATIVE NEGATIVE Final    Comment:        The GeneXpert MRSA Assay (FDA approved for NASAL specimens only), is one component of a comprehensive MRSA colonization surveillance program. It is not intended to diagnose MRSA infection nor to guide or monitor treatment for MRSA infections.          Radiology Studies: Dg Chest 2 View  Result Date: 01/08/2017 CLINICAL DATA:  Acute onset of generalized weakness and tachycardia. Nausea and vomiting. Initial encounter. EXAM: CHEST  2 VIEW COMPARISON:  Chest radiograph performed 10/06/2015 FINDINGS: The lungs are well-aerated and clear. There is no evidence of focal opacification, pleural effusion or pneumothorax. The heart is mildly enlarged. No acute osseous abnormalities are seen. Changes of  vertebroplasty are noted at the lower thoracic spine. Clips are noted within the right upper quadrant, reflecting prior cholecystectomy. IMPRESSION: Mild cardiomegaly.  Lungs remain grossly clear. Electronically Signed   By: Garald Balding M.D.   On: 01/08/2017 22:26   Ct Abdomen Pelvis W Contrast  Result Date: 01/08/2017 CLINICAL DATA:  Generalized body weakness, nausea and vomiting EXAM: CT ABDOMEN AND PELVIS WITH CONTRAST TECHNIQUE: Multidetector CT imaging of the abdomen and pelvis was performed using the standard protocol following bolus administration of intravenous contrast. CONTRAST:  178mL ISOVUE-300 IOPAMIDOL (ISOVUE-300) INJECTION 61% COMPARISON:  CT exam from 05/24/2006 and 01/29/2015 FINDINGS: Lower chest: Normal size cardiac chambers. Scarring and atelectasis at the right lung base. No effusion or pneumothorax. No pericardial effusion. Hepatobiliary: Cholecystectomy. No space-occupying mass of the liver. No choledocholithiasis. Pancreas: No space-occupying mass of the pancreas. Pancreatic duct is mildly dilated up to 4-5 mm without obstructing source noted and may reflect chronic changes secondary to cholecystectomy. This may represent some ectasia. Spleen: Normal in size without focal abnormality. Adrenals/Urinary Tract: Normal bilateral adrenal glands and kidneys. No obstructive uropathy. Bladder is unremarkable. Stomach/Bowel: Physiologically distended stomach. Interposed between the left hepatic lobe and stomach is a calcified mass measuring 2.1 x 2.2 cm, series 3, image 15 which dates back to 2007 but is densely calcified and likely to reflect a benign finding possibly a gastric diverticulum with calcification seen within. There is normal small bowel rotation without obstruction or inflammation. The patient is status post hysterectomy. No large bowel abnormality is noted. Vascular/Lymphatic: Aortic atherosclerosis without aneurysm. No lymphadenopathy. Reproductive: Status post hysterectomy.   No adnexal mass. Other: No ascites.  No hernia. Musculoskeletal: Vertebral augmentation at T11 and T12 with slight interval progression of compression fracture of L4 involving the inferior endplate since 0626. Vacuum disc with worsening of degenerative disc disease also noted at L2-3. IMPRESSION: 1. No acute intraabdominal nor pelvic abnormality. 2. Status post cholecystectomy with chronic mild ductal dilatation the pancreas and common bile duct likely representing reservoir effect from prior surgery. 3. Calcified masslike  abnormality interposed between the stomach and left hepatic lobe measuring 2.2 x 2.1 cm, dating back to 2007 consistent with a benign finding possibly a gastric diverticulum now containing calcification within. 4. Aortic atherosclerosis. 5. Lumbar spondylosis with vertebral augmentation at T11 and T12. Worsening of degenerative disc disease at L2-3 and inferior endplate compression of L4 since 2016 comparison. Electronically Signed   By: Ashley Royalty M.D.   On: 01/08/2017 23:27        Scheduled Meds: . aspirin EC  81 mg Oral Daily  . atorvastatin  40 mg Oral QHS  . enoxaparin (LOVENOX) injection  30 mg Subcutaneous Q24H  . guaiFENesin  600 mg Oral BID  . hydrocortisone sod succinate (SOLU-CORTEF) inj  100 mg Intravenous Q8H  . hydroxychloroquine  200 mg Oral BID  . insulin aspart  0-9 Units Subcutaneous TID WC  . mouth rinse  15 mL Mouth Rinse BID  . pantoprazole  40 mg Oral Daily  . senna  1 tablet Oral BID  . sodium chloride flush  3 mL Intravenous Q12H   Continuous Infusions: . sodium chloride 125 mL/hr at 01/09/17 1846  . cefTRIAXone (ROCEPHIN)  IV Stopped (01/10/17 0355)     LOS: 1 day    Time spent: 40 minutes    WOODS, Geraldo Docker, MD [facility] Pager [phone]   If 7PM-7AM, please contact night-coverage [email or web address] Password [email or web address] 01/10/2017, 8:00 AM

## 2017-01-10 NOTE — Evaluation (Signed)
Physical Therapy Evaluation Patient Details Name: [name] MRN: [id number] DOB: [birth year] Today's Date: 01/10/2017   History of Present Illness  81 y.o. female admitted on 01/08/17 for abdominal pain, N/V/D and fever.  Pt dx with sepsis due to UTI and possibly oral infection due to bad teeth.  Pt with significant PMH of DM2, HTN, anemia, anginal pain, L shoulder ORIF, and back surgery.  Clinical Impression  Pt is generally weak and deconditioned from her current acute illness and hospital stay, but looks fairly stable using RW for gait and has one at home to use.  She would benefit from HHPT to work on strength and endurance training when she d/c home.   PT to follow acutely for deficits listed below.       Follow Up Recommendations Home health PT    Equipment Recommendations  3in1 (PT)    Recommendations for Other Services   NA    Precautions / Restrictions Precautions Precautions: Fall      Mobility  Bed Mobility Overal bed mobility: Modified Independent                Transfers Overall transfer level: Needs assistance   Transfers: Sit to/from Stand Sit to Stand: Min assist         General transfer comment: Min assist to support trunk during transitions.  Verbal cues for safe hand placement.   Ambulation/Gait Ambulation/Gait assistance: Min guard;Supervision Ambulation Distance (Feet): 85 Feet Assistive device: Rolling walker (2 wheeled) Gait Pattern/deviations: Step-through pattern;Shuffle Gait velocity: decreased Gait velocity interpretation: Below normal speed for age/gender General Gait Details: Pt with slow, yet steady gait with RW.  She reports she can use her full time at home until she feels better and less weak on her feet.           Balance Overall balance assessment: Needs assistance Sitting-balance support: Feet supported;Bilateral upper extremity supported Sitting balance-Leahy Scale: Good     Standing balance support: Bilateral  upper extremity supported;Single extremity supported;No upper extremity supported Standing balance-Leahy Scale: Fair                               Pertinent Vitals/Pain Pain Assessment: Faces Faces Pain Scale: Hurts little more Pain Location: chronic low back pain Pain Descriptors / Indicators: Grimacing;Guarding Pain Intervention(s): Limited activity within patient's tolerance;Monitored during session;Repositioned    Home Living Family/patient expects to be discharged to:: Private residence Living Arrangements: Alone Available Help at Discharge: Family;Other (Comment) (nephew and niece come over) Type of Home: Apartment Home Access: Stairs to enter Entrance Stairs-Rails: Right Entrance Stairs-Number of Steps: 2 (3 steps at the street with a rail) Home Layout: One level Home Equipment: Walker - 2 wheels Additional Comments: h/o falls    Prior Function Level of Independence: Needs assistance   Gait / Transfers Assistance Needed: uses RW at times  ADL's / Homemaking Assistance Needed: assist with bathing from niece, does get in the tub.          Hand Dominance   Dominant Hand: Right    Extremity/Trunk Assessment   Upper Extremity Assessment Upper Extremity Assessment: Defer to OT evaluation    Lower Extremity Assessment Lower Extremity Assessment: Generalized weakness (grossly at least 3/5 throughout, equal bil, sensation intact)    Cervical / Trunk Assessment Cervical / Trunk Assessment: Other exceptions Cervical / Trunk Exceptions: h/o low back surgery.   Communication   Communication: No difficulties  Cognition Arousal/Alertness: Awake/alert  Behavior During Therapy: WFL for tasks assessed/performed Overall Cognitive Status: No family/caregiver present to determine baseline cognitive functioning                                 General Comments: Not specifically tested, but conversation WNL.  RN recommended alarm for chair.               Assessment/Plan    PT Assessment Patient needs continued PT services  PT Problem List Decreased strength;Decreased activity tolerance;Decreased balance;Decreased mobility;Decreased knowledge of use of DME;Pain       PT Treatment Interventions DME instruction;Gait training;Stair training;Functional mobility training;Therapeutic activities;Therapeutic exercise;Balance training;Patient/family education    PT Goals (Current goals can be found in the Care Plan section)  Acute Rehab PT Goals Patient Stated Goal: to go back home and get stronger.  PT Goal Formulation: With patient Time For Goal Achievement: 01/24/17 Potential to Achieve Goals: Good    Frequency Min 3X/week           AM-PAC PT "6 Clicks" Daily Activity  Outcome Measure Difficulty turning over in bed (including adjusting bedclothes, sheets and blankets)?: None Difficulty moving from lying on back to sitting on the side of the bed? : None Difficulty sitting down on and standing up from a chair with arms (e.g., wheelchair, bedside commode, etc,.)?: Total Help needed moving to and from a bed to chair (including a wheelchair)?: A Little Help needed walking in hospital room?: A Little Help needed climbing 3-5 steps with a railing? : A Little 6 Click Score: 18    End of Session Equipment Utilized During Treatment: Gait belt Activity Tolerance: Patient limited by fatigue Patient left: in chair;with call bell/phone within reach;with chair alarm set Nurse Communication: Mobility status PT Visit Diagnosis: Muscle weakness (generalized) (M62.81);Other abnormalities of gait and mobility (R26.89)    Time: 5852-7782 PT Time Calculation (min) (ACUTE ONLY): 43 min   Charges:          Wells Guiles B. Chihiro Frey, PT, DPT [phone]   PT Evaluation $PT Eval Moderate Complexity: 1 Procedure PT Treatments $Gait Training: 8-22 mins $Therapeutic Activity: 8-22 mins   01/10/2017, 10:08 AM

## 2017-01-11 DIAGNOSIS — E118 Type 2 diabetes mellitus with unspecified complications: Secondary | ICD-10-CM

## 2017-01-11 DIAGNOSIS — A419 Sepsis, unspecified organism: Secondary | ICD-10-CM

## 2017-01-11 DIAGNOSIS — J439 Emphysema, unspecified: Secondary | ICD-10-CM

## 2017-01-11 DIAGNOSIS — N390 Urinary tract infection, site not specified: Secondary | ICD-10-CM

## 2017-01-11 DIAGNOSIS — E1165 Type 2 diabetes mellitus with hyperglycemia: Secondary | ICD-10-CM

## 2017-01-11 DIAGNOSIS — E119 Type 2 diabetes mellitus without complications: Secondary | ICD-10-CM

## 2017-01-11 DIAGNOSIS — N179 Acute kidney failure, unspecified: Secondary | ICD-10-CM

## 2017-01-11 DIAGNOSIS — M069 Rheumatoid arthritis, unspecified: Secondary | ICD-10-CM

## 2017-01-11 DIAGNOSIS — I5032 Chronic diastolic (congestive) heart failure: Secondary | ICD-10-CM

## 2017-01-11 LAB — GLUCOSE, CAPILLARY
Glucose-Capillary: 77 mg/dL (ref 65–99)
Glucose-Capillary: 125 mg/dL — ABNORMAL HIGH (ref 65–99)

## 2017-01-11 LAB — CULTURE, BLOOD (ROUTINE X 2): Special Requests: ADEQUATE

## 2017-01-11 MED ORDER — CEFIXIME 400 MG PO CAPS: 400.0000 mg | ORAL_CAPSULE | Freq: Every day | ORAL | 0 refills | Status: DC

## 2017-01-11 MED ORDER — INSULIN GLARGINE 100 UNITS/ML SOLOSTAR PEN: 6.0000 [IU] | PEN_INJECTOR | Freq: Every day | SUBCUTANEOUS | 11 refills | Status: DC

## 2017-01-11 MED ORDER — CEFIXIME 400 MG PO CAPS
400.0000 mg | ORAL_CAPSULE | Freq: Every day | ORAL | Status: DC
  Administered 2017-01-11: 400 mg via ORAL
  Filled 2017-01-11: qty 1

## 2017-01-11 MED ORDER — "PEN NEEDLES 1/2"" 29G X 12MM MISC": 6.0000 [IU] | Freq: Every day | 0 refills | Status: DC

## 2017-01-11 NOTE — Progress Notes (Signed)
Paged Sarah with care management concerning home health and 3 in 1 for pt before she is discharged today. Waiting for call back

## 2017-01-11 NOTE — Progress Notes (Signed)
CM notified [facility] DME rep, Jermaine to please deliver the 3n1 to room so pt can discharge. CM has texted MD to add on [place] so pt can get [facility] aide as aide cannot stand alone as a HH order.  Pt is already set up with [facility] and just waiting for order to schedule start of care. No other CM needs were communicated.

## 2017-01-11 NOTE — Discharge Summary (Addendum)
Physician Discharge Summary  [name] IEP:[id number] DOB: [birth year] DOA: 01/08/2017  PCP: Nolene Ebbs, MD  Admit date: 01/08/2017 Discharge date: 01/11/2017  Time spent: 30 minutes  Recommendations for Outpatient Follow-up:  Sepsis UTIpositive GNR/Bacteremia positive coag negative staph (contaminant) -Complete 5 days antibiotics.  -Patient was also counseled by RN on proper hygiene when having a BM. -Although susceptibilities have not returned patient has defervesced and is now asymptomatic. DC Ceftriaxone and have patient complete course of antibiotics with Cefixime.    Rheumatoid arthritis (HCC) -continue Plaquenil, 200 mg BID -Transition to Methylprednisolone 2-4 daily   Diabetes mellitus Type 2 uncontrolled with complications/Hyperglycemia -6/14 Hemoglobin A1c= 10.1 -Patient on two PO . Diabetic medications without proper control. Guidelines would indicate patient would need to transition to insulin -Start Lantus 6 units QHS -Januvia 100 mg daily -Given patient's acute renal failure and the fact that was not controlling her diabetes would hold off on restarting Metformin. -Schedule follow-up with Dr. Nolene Ebbs in one week DM type II uncontrolled, frequent UTI, acute renal failure  Acute renal failure - most likely secondary to dehydration secondary to nausea and vomiting will rehydrate and follow kidney function obtain urine electrolytes Lab Results  Component Value Date   CREATININE 0.71 01/10/2017   CREATININE 1.29 (H) 01/09/2017   CREATININE 1.48 (H) 01/08/2017  -Resolved  Chest pain - Troponin 3 negative -CP resolved  Chronic diastolic CHF -Echocardiogram shows mild diastolic dysfunction  Chronic Bronchitis/ COPD  -Currently nonissue.  Poor Dentation -Recommend patient make appointment with DDS. Scott Rhem at [facility] [place] [phone]    Discharge Diagnoses:  Active Problems:   Dehydration,  moderate   Rheumatoid arthritis (HCC)   Chest pain   DM (diabetes mellitus) (HCC)   Elevated lactic acid level   Chronic diastolic heart failure, NYHA class 1 (HCC)   Acute lower UTI   Sepsis (HCC)   Constipation   COPD (chronic obstructive pulmonary disease) (HCC)   Sepsis secondary to UTI [facility])   Uncontrolled type 2 diabetes mellitus with complication (HCC)   Acute renal failure (HCC)   Chronic diastolic CHF (congestive heart failure) ([place])   Discharge Condition: Stable  Diet recommendation: American diabetic Association  Filed Weights   01/08/17 2122 01/09/17 0406  Weight: 141 lb (64 kg) 141 lb 12.1 oz (64.3 kg)    History of present illness:  81 y.o.BF PMHx  DM type 2, chronic bronchitis, OSA (BiPAP?), anemia, GERD, HLD, RA on chronic steroids and plaquenil   Presented with 1 week history of nausea vomiting and generalized fatigue to feeling lightheaded patient was worried that she has eaten something bad her heart rate is has been elevated up to 140s. She has been having intermittent left sided chest pain worse with exertion. She reports feeling lightheaded when she stands up. Curretnly chest pain free.  She's been having lower abdominal pain no diarrhea although she has been feeling constipated and she has not had any dysuria but she feels that she may have been some afebrile. She is chronically short of breath and notes that her baseline she uses nebulizers twice a day. She reports no BM for the past 3 days reports frequent UTI Patient was admitted and treated for her sepsis UTI. Will finish her course of antibiotics PO. Patient was also given referral to a [facility] in order to treat her extremely poor dentation.   Procedures: 6/14 Echocardiogram:Left ventricle: moderate LVH. -LVEF = 65%to 70%.  -(  grade 1 diastolic dysfunction).  Consultations: None  Culture 6/13 blood positive coag negative staph (contaminant?) 6/13  blood pending 6/13 urine positive ESCHERICHIA COLI / ENTEROBACTER AEROGENES    Antibiotics Anti-infectives    Start     Stop   01/11/17 1415  Cefixime (SUPRAX) capsule 400 mg         01/11/17 0000  Cefixime (SUPRAX) 400 MG CAPS capsule         01/10/17 0400  ceFAZolin (ANCEF) IVPB 1 g/50 mL premix  Status:  Discontinued     01/10/17 0234   01/10/17 0400  ceFAZolin (ANCEF) IVPB 2g/100 mL premix  Status:  Discontinued     01/10/17 0245   01/10/17 0400  cefTRIAXone (ROCEPHIN) 2 g in dextrose 5 % 50 mL IVPB  Status:  Discontinued     01/11/17 1414   01/09/17 1000  hydroxychloroquine (PLAQUENIL) tablet 200 mg         01/09/17 0800  piperacillin-tazobactam (ZOSYN) IVPB 3.375 g  Status:  Discontinued     01/09/17 0427   01/09/17 0600  cefTRIAXone (ROCEPHIN) 1 g in dextrose 5 % 50 mL IVPB  Status:  Discontinued     01/10/17 0245   01/09/17 0415  cefTRIAXone (ROCEPHIN) 2 g in dextrose 5 % 50 mL IVPB  Status:  Discontinued     01/09/17 0415   01/08/17 2330  piperacillin-tazobactam (ZOSYN) IVPB 3.375 g     01/09/17 0023   01/08/17 2330  vancomycin (VANCOCIN) IVPB 1000 mg/200 mL premix     01/09/17 0132        Discharge Exam: Vitals:   01/10/17 2330 01/11/17 0313 01/11/17 0722 01/11/17 1210  BP: 109/83 (!) 141/80 (!) 144/79 (!) 131/99  Pulse: 85 85 79 86  Resp: 15 (!) 22 (!) 23 17  Temp: 97.9 F (36.6 C) 97.9 F (36.6 C)  97.5 F (36.4 C)  TempSrc: Oral Oral  Axillary  SpO2: 94% 97% 96% 99%  Weight:      Height:        General: A/O 4, No acute respiratory distress Eyes: negative scleral hemorrhage, negative anisocoria, negative icterus ENT: Negative Runny nose, negative gingival bleeding, extremely poor dentation multiple teeth fractured off at the gum with obvious rot. Right-sided facial swelling/neck swelling negative pain to palpation Neck:  Negative scars, masses, torticollis, lymphadenopathy, JVD Lungs: Clear to auscultation bilaterally without wheezes or  crackles Cardiovascular: Regular rate and rhythm without murmur gallop or rub normal S1 and S2 Abdomen: negative abdominal pain, nondistended, positive soft, bowel sounds, no rebound, no ascites, no appreciable mass    Discharge Instructions   Allergies as of 01/11/2017   No Known Allergies     Medication List    STOP taking these medications   docusate sodium 100 MG capsule Commonly known as:  COLACE   feeding supplement (ENSURE ENLIVE) Liqd   hydrocortisone 10 MG tablet Commonly known as:  CORTEF   hydrOXYzine 25 MG tablet Commonly known as:  ATARAX/VISTARIL   IBU 800 MG tablet Generic drug:  ibuprofen   losartan 25 MG tablet Commonly known as:  COZAAR   magic mouthwash w/lidocaine Soln   metFORMIN 500 MG tablet Commonly known as:  GLUCOPHAGE   metoprolol tartrate 25 MG tablet Commonly known as:  LOPRESSOR   SUMAtriptan 50 MG tablet Commonly known as:  IMITREX   traZODone 100 MG tablet Commonly known as:  DESYREL     TAKE these medications   aspirin EC 81 MG tablet Take 1  tablet (81 mg total) by mouth daily.   atorvastatin 40 MG tablet Commonly known as:  LIPITOR Take 40 mg by mouth at bedtime.   Cefixime 400 MG Caps capsule Commonly known as:  SUPRAX Take 1 capsule (400 mg total) by mouth daily.   diazepam 5 MG tablet Commonly known as:  VALIUM Take 5 mg by mouth daily as needed for anxiety.   hydroxychloroquine 200 MG tablet Commonly known as:  PLAQUENIL Take 200 mg by mouth 2 (two) times daily.   insulin glargine 100 unit/mL Sopn Commonly known as:  LANTUS Inject 0.06 mLs (6 Units total) into the skin at bedtime. What changed:  how much to take  when to take this   JANUVIA 100 MG tablet Generic drug:  sitaGLIPtin Take 100 mg by mouth daily.   methylPREDNISolone 4 MG tablet Commonly known as:  MEDROL Take 4 mg by mouth every morning.   omeprazole 20 MG capsule Commonly known as:  PRILOSEC Take 20 mg by mouth 2 (two) times  daily.   PEN NEEDLES 29GX1/2" 29G X 12MM Misc 6 Units by Does not apply route at bedtime.   traMADol 50 MG tablet Commonly known as:  ULTRAM Take 50 mg by mouth 2 (two) times daily as needed for pain.            Durable Medical Equipment        Start     Ordered   01/10/17 1656  For home use only DME 3 n 1  Once     01/10/17 1656     No Known Allergies Follow-up Information    [facility] Follow up.   Why:  3 n 1 Contact information: [address] [place] [address] [phone]        [facility] up.   Specialty:  Home Health Services Why:  HHPT, HHOT Contact information: [address] [place] [address] [phone]        Nolene Ebbs, MD. Schedule an appointment as soon as possible for a visit in 1 week(s).   Specialty:  Internal Medicine Why:  Schedule follow-up with Dr. Nolene Ebbs in one week DM type II uncontrolled, frequent UTI, acute renal failure Contact information: [place] [address] [phone]        Feliberto Harts R. Schedule an appointment as soon as possible for a visit in 1 week(s).   Why:  make appointment with Dr. Scheryl Darter at [facility] [place] [phone]           The results of significant diagnostics from this hospitalization (including imaging, microbiology, ancillary and laboratory) are listed below for reference.    Significant Diagnostic Studies: Dg Chest 2 View  Result Date: 01/08/2017 CLINICAL DATA:  Acute onset of generalized weakness and tachycardia. Nausea and vomiting. Initial encounter. EXAM: CHEST  2 VIEW COMPARISON:  Chest radiograph performed 10/06/2015 FINDINGS: The lungs are well-aerated and clear. There is no evidence of focal opacification, pleural effusion or pneumothorax. The heart is mildly enlarged. No acute osseous abnormalities are seen. Changes of vertebroplasty are noted at the  lower thoracic spine. Clips are noted within the right upper quadrant, reflecting prior cholecystectomy. IMPRESSION: Mild cardiomegaly.  Lungs remain grossly clear. Electronically Signed   By: Garald Balding M.D.   On: 01/08/2017 22:26   Ct Abdomen Pelvis W Contrast  Result Date: 01/08/2017 CLINICAL DATA:  Generalized body weakness, nausea  and vomiting EXAM: CT ABDOMEN AND PELVIS WITH CONTRAST TECHNIQUE: Multidetector CT imaging of the abdomen and pelvis was performed using the standard protocol following bolus administration of intravenous contrast. CONTRAST:  148mL ISOVUE-300 IOPAMIDOL (ISOVUE-300) INJECTION 61% COMPARISON:  CT exam from 05/24/2006 and 01/29/2015 FINDINGS: Lower chest: Normal size cardiac chambers. Scarring and atelectasis at the right lung base. No effusion or pneumothorax. No pericardial effusion. Hepatobiliary: Cholecystectomy. No space-occupying mass of the liver. No choledocholithiasis. Pancreas: No space-occupying mass of the pancreas. Pancreatic duct is mildly dilated up to 4-5 mm without obstructing source noted and may reflect chronic changes secondary to cholecystectomy. This may represent some ectasia. Spleen: Normal in size without focal abnormality. Adrenals/Urinary Tract: Normal bilateral adrenal glands and kidneys. No obstructive uropathy. Bladder is unremarkable. Stomach/Bowel: Physiologically distended stomach. Interposed between the left hepatic lobe and stomach is a calcified mass measuring 2.1 x 2.2 cm, series 3, image 15 which dates back to 2007 but is densely calcified and likely to reflect a benign finding possibly a gastric diverticulum with calcification seen within. There is normal small bowel rotation without obstruction or inflammation. The patient is status post hysterectomy. No large bowel abnormality is noted. Vascular/Lymphatic: Aortic atherosclerosis without aneurysm. No lymphadenopathy. Reproductive: Status post hysterectomy.  No adnexal mass. Other: No  ascites.  No hernia. Musculoskeletal: Vertebral augmentation at T11 and T12 with slight interval progression of compression fracture of L4 involving the inferior endplate since 7106. Vacuum disc with worsening of degenerative disc disease also noted at L2-3. IMPRESSION: 1. No acute intraabdominal nor pelvic abnormality. 2. Status post cholecystectomy with chronic mild ductal dilatation the pancreas and common bile duct likely representing reservoir effect from prior surgery. 3. 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 4. Aortic atherosclerosis. 5. Lumbar spondylosis with vertebral augmentation at T11 and T12. Worsening of degenerative disc disease at L2-3 and inferior endplate compression of L4 since 2016 comparison. Electronically Signed   By: Ashley Royalty M.D.   On: 01/08/2017 23:27    Microbiology: Recent Results (from the past 240 hour(s))  Blood Culture (routine x 2)     Status: Abnormal   Collection Time: 01/08/17 11:51 PM  Result Value Ref Range Status   Specimen Description BLOOD RIGHT ANTECUBITAL  Final   Special Requests   Final    BOTTLES DRAWN AEROBIC AND ANAEROBIC Blood Culture adequate volume   Culture  Setup Time   Final    GRAM POSITIVE COCCI IN CLUSTERS AEROBIC BOTTLE ONLY CRITICAL RESULT CALLED TO, READ BACK BY AND VERIFIED WITH: V.BRYK,PHARMD AT 0217 ON 01/10/17 BY G.[email or web address]    Culture (A)  Final    STAPHYLOCOCCUS SPECIES (COAGULASE NEGATIVE) THE SIGNIFICANCE OF ISOLATING THIS ORGANISM FROM A SINGLE SET OF BLOOD CULTURES WHEN MULTIPLE SETS ARE DRAWN IS UNCERTAIN. PLEASE NOTIFY THE MICROBIOLOGY DEPARTMENT WITHIN ONE WEEK IF SPECIATION AND SENSITIVITIES ARE REQUIRED.    Report Status 01/11/2017 FINAL  Final  Urine Culture     Status: Abnormal (Preliminary result)   Collection Time: 01/08/17  11:51 PM  Result Value Ref Range Status   Specimen Description URINE, CLEAN CATCH  Final   Special Requests ADDED [phone] [id number]  Final   Culture (A)  Final    >=100,000 COLONIES/mL ESCHERICHIA COLI >=100,000 COLONIES/mL ENTEROBACTER AEROGENES SUSCEPTIBILITIES TO FOLLOW    Report Status PENDING  Incomplete  Blood Culture ID Panel (  Reflexed)     Status: Abnormal   Collection Time: 01/08/17 11:51 PM  Result Value Ref Range Status   Enterococcus species NOT DETECTED NOT DETECTED Final   Listeria monocytogenes NOT DETECTED NOT DETECTED Final   Staphylococcus species DETECTED (A) NOT DETECTED Final    Comment: Methicillin (oxacillin) susceptible coagulase negative staphylococcus. Possible blood culture contaminant (unless isolated from more than one blood culture draw or clinical case suggests pathogenicity). No antibiotic treatment is indicated for blood  culture contaminants. CRITICAL RESULT CALLED TO, READ BACK BY AND VERIFIED WITH: V.BRYK,PHARMD AT 0217 ON 01/10/17 BY G.MCADOO    Staphylococcus aureus NOT DETECTED NOT DETECTED Final   Methicillin resistance NOT DETECTED NOT DETECTED Final   Streptococcus species NOT DETECTED NOT DETECTED Final   Streptococcus agalactiae NOT DETECTED NOT DETECTED Final   Streptococcus pneumoniae NOT DETECTED NOT DETECTED Final   Streptococcus pyogenes NOT DETECTED NOT DETECTED Final   Acinetobacter baumannii NOT DETECTED NOT DETECTED Final   Enterobacteriaceae species NOT DETECTED NOT DETECTED Final   Enterobacter cloacae complex NOT DETECTED NOT DETECTED Final   Escherichia coli NOT DETECTED NOT DETECTED Final   Klebsiella oxytoca NOT DETECTED NOT DETECTED Final   Klebsiella pneumoniae NOT DETECTED NOT DETECTED Final   Proteus species NOT DETECTED NOT DETECTED Final   Serratia marcescens NOT DETECTED NOT DETECTED Final   Haemophilus influenzae NOT DETECTED NOT DETECTED Final   Neisseria meningitidis NOT DETECTED NOT DETECTED Final   Pseudomonas  aeruginosa NOT DETECTED NOT DETECTED Final   Candida albicans NOT DETECTED NOT DETECTED Final   Candida glabrata NOT DETECTED NOT DETECTED Final   Candida krusei NOT DETECTED NOT DETECTED Final   Candida parapsilosis NOT DETECTED NOT DETECTED Final   Candida tropicalis NOT DETECTED NOT DETECTED Final  Blood Culture (routine x 2)     Status: None (Preliminary result)   Collection Time: 01/08/17 11:58 PM  Result Value Ref Range Status   Specimen Description BLOOD RIGHT HAND  Final   Special Requests IN PEDIATRIC BOTTLE Blood Culture adequate volume  Final   Culture NO GROWTH 2 DAYS  Final   Report Status PENDING  Incomplete  MRSA PCR Screening     Status: None   Collection Time: 01/09/17 12:47 AM  Result Value Ref Range Status   MRSA by PCR NEGATIVE NEGATIVE Final    Comment:        The GeneXpert MRSA Assay (FDA approved for NASAL specimens only), is one component of a comprehensive MRSA colonization surveillance program. It is not intended to diagnose MRSA infection nor to guide or monitor treatment for MRSA infections.      Labs: Basic Metabolic Panel:  Recent Labs Lab 01/08/17 2126 01/09/17 0448 01/10/17 0802  NA 141 141 141  K 3.9 4.0 4.2  CL 104 104 108  CO2 24 27 26   GLUCOSE 226* 144* 185*  BUN 22* 18 7  CREATININE 1.48* 1.29* 0.71  CALCIUM 9.4 8.5* 7.9*  MG  --  2.3 1.7  PHOS  --  3.8  --    Liver Function Tests:  Recent Labs Lab 01/09/17 0448  AST 24  ALT 17  ALKPHOS 41  BILITOT 0.6  PROT 6.1*  ALBUMIN 3.1*   No results for input(s): LIPASE, AMYLASE in the last 168 hours. No results for input(s): AMMONIA in the last 168 hours. CBC:  Recent Labs Lab 01/08/17 2126 01/09/17 0448 01/10/17 0802  WBC 8.4 7.3 9.2  NEUTROABS  --   --  6.5  HGB 15.0 13.4 11.1*  HCT 48.1* 42.5 35.9*  MCV 89.2 89.9 88.9  PLT 253 196 192   Cardiac Enzymes:  Recent Labs Lab 01/09/17 0221 01/09/17 0724 01/09/17 1352  TROPONINI <0.03 <0.03 <0.03   BNP: BNP  (last 3 results) No results for input(s): BNP in the last 8760 hours.  ProBNP (last 3 results) No results for input(s): PROBNP in the last 8760 hours.  CBG:  Recent Labs Lab 01/10/17 1126 01/10/17 1725 01/10/17 2155 01/11/17 0834 01/11/17 1306  GLUCAP 188* 137* 143* 77 125*       Signed:  Dia Crawford, MD [facility] [phone] pager

## 2017-01-11 NOTE — Progress Notes (Signed)
Received call back from [place] case Freight forward[employer], waiting on 3 in 1 and paged Dr Sherral Hammers to request order for RN or PT to go along with home health aide order. Reviewed discharge paperwork with pt and nephew including how to take lantus and new medications including dose and when next dose is due. Reviewed s/s of infection and when to follow up with PCP and oral surgeon. Pt verbalizes understanding.

## 2017-01-12 LAB — URINE CULTURE: Culture: >=100000 — AB

## 2017-01-14 LAB — CULTURE, BLOOD (ROUTINE X 2)
Culture: NO GROWTH
Special Requests: ADEQUATE

## 2017-03-02 ENCOUNTER — Emergency Department (HOSPITAL_COMMUNITY)
Admission: EM | Admit: 2017-03-02 | Discharge: 2017-03-02 | Disposition: A | Discharge status: Home / Self Care | Payer: Medicare Other | Attending: Emergency Medicine | Admitting: Emergency Medicine

## 2017-03-02 ENCOUNTER — Emergency Department (HOSPITAL_COMMUNITY): Payer: Medicare Other

## 2017-03-02 DIAGNOSIS — I5032 Chronic diastolic (congestive) heart failure: Secondary | ICD-10-CM | POA: Insufficient documentation

## 2017-03-02 DIAGNOSIS — E1165 Type 2 diabetes mellitus with hyperglycemia: Secondary | ICD-10-CM | POA: Insufficient documentation

## 2017-03-02 DIAGNOSIS — Z79899 Other long term (current) drug therapy: Secondary | ICD-10-CM | POA: Insufficient documentation

## 2017-03-02 DIAGNOSIS — M25552 Pain in left hip: Secondary | ICD-10-CM | POA: Diagnosis not present

## 2017-03-02 DIAGNOSIS — I110 Hypertensive heart disease with heart failure: Secondary | ICD-10-CM | POA: Insufficient documentation

## 2017-03-02 DIAGNOSIS — J449 Chronic obstructive pulmonary disease, unspecified: Secondary | ICD-10-CM | POA: Diagnosis not present

## 2017-03-02 DIAGNOSIS — Z7984 Long term (current) use of oral hypoglycemic drugs: Secondary | ICD-10-CM | POA: Insufficient documentation

## 2017-03-02 DIAGNOSIS — Z7982 Long term (current) use of aspirin: Secondary | ICD-10-CM | POA: Diagnosis not present

## 2017-03-02 DIAGNOSIS — N3000 Acute cystitis without hematuria: Secondary | ICD-10-CM | POA: Diagnosis not present

## 2017-03-02 LAB — CBC WITH DIFFERENTIAL/PLATELET
BASOS ABS: 0.1 10*3/uL (ref 0.0–0.1)
BASOS PCT: 1 %
Eosinophils Absolute: 0.4 10*3/uL (ref 0.0–0.7)
Eosinophils Relative: 4 %
HEMATOCRIT: 39.9 % (ref 36.0–46.0)
HEMOGLOBIN: 13.1 g/dL (ref 12.0–15.0)
LYMPHS PCT: 34 %
Lymphs Abs: 2.9 10*3/uL (ref 0.7–4.0)
MCH: 28.7 pg (ref 26.0–34.0)
MCHC: 32.8 g/dL (ref 30.0–36.0)
MCV: 87.3 fL (ref 78.0–100.0)
MONOS PCT: 10 %
Monocytes Absolute: 0.8 10*3/uL (ref 0.1–1.0)
NEUTROS PCT: 51 %
Neutro Abs: 4.4 10*3/uL (ref 1.7–7.7)
Platelets: 232 10*3/uL (ref 150–400)
RBC: 4.57 MIL/uL (ref 3.87–5.11)
RDW: 14.7 % (ref 11.5–15.5)
WBC: 8.5 10*3/uL (ref 4.0–10.5)

## 2017-03-02 LAB — URINALYSIS, ROUTINE W REFLEX MICROSCOPIC
BILIRUBIN URINE: NEGATIVE
Glucose, UA: NEGATIVE mg/dL
KETONES UR: 20 mg/dL — AB
Nitrite: NEGATIVE
PROTEIN: 30 mg/dL — AB
Specific Gravity, Urine: 1.023 (ref 1.005–1.030)
pH: 5 (ref 5.0–8.0)

## 2017-03-02 LAB — LIPASE, BLOOD: Lipase: 53 U/L — ABNORMAL HIGH (ref 11–51)

## 2017-03-02 LAB — COMPREHENSIVE METABOLIC PANEL
ALBUMIN: 3.4 g/dL — AB (ref 3.5–5.0)
ALT: 17 U/L (ref 14–54)
ANION GAP: 10 (ref 5–15)
AST: 27 U/L (ref 15–41)
Alkaline Phosphatase: 38 U/L (ref 38–126)
BILIRUBIN TOTAL: 0.4 mg/dL (ref 0.3–1.2)
BUN: 11 mg/dL (ref 6–20)
CHLORIDE: 104 mmol/L (ref 101–111)
CO2: 29 mmol/L (ref 22–32)
Calcium: 9.4 mg/dL (ref 8.9–10.3)
Creatinine, Ser: 0.94 mg/dL (ref 0.44–1.00)
GFR calc Af Amer: >60 mL/min (ref >60)
GFR calc non Af Amer: 57 mL/min — ABNORMAL LOW (ref >60)
GLUCOSE: 90 mg/dL (ref 65–99)
POTASSIUM: 3.9 mmol/L (ref 3.5–5.1)
SODIUM: 143 mmol/L (ref 135–145)
TOTAL PROTEIN: 6.4 g/dL — AB (ref 6.5–8.1)

## 2017-03-02 MED ORDER — CEPHALEXIN 250 MG PO CAPS: 250.0000 mg | ORAL_CAPSULE | Freq: Four times a day (QID) | ORAL | 0 refills | Status: AC

## 2017-03-02 MED ORDER — SODIUM CHLORIDE 0.9 % IV BOLUS (SEPSIS)
1000.0000 mL | Freq: Once | INTRAVENOUS | Status: AC
  Administered 2017-03-02: 1000 mL via INTRAVENOUS

## 2017-03-02 MED ORDER — HYDROCODONE-ACETAMINOPHEN 5-325 MG PO TABS: 1.0000 | ORAL_TABLET | Freq: Four times a day (QID) | ORAL | 0 refills | Status: DC | PRN

## 2017-03-02 MED ORDER — ONDANSETRON HCL 4 MG/2ML IJ SOLN
4.0000 mg | Freq: Once | INTRAMUSCULAR | Status: AC
  Administered 2017-03-02: 4 mg via INTRAVENOUS
  Filled 2017-03-02: qty 2

## 2017-03-02 MED ORDER — SODIUM CHLORIDE 0.9 % IV SOLN
INTRAVENOUS | Status: DC
  Administered 2017-03-02: 07:00:00 via INTRAVENOUS

## 2017-03-02 MED ORDER — CEFTRIAXONE SODIUM 1 G IJ SOLR
1.0000 g | Freq: Once | INTRAMUSCULAR | Status: AC
  Administered 2017-03-02: 1 g via INTRAVENOUS
  Filled 2017-03-02: qty 10

## 2017-03-02 MED ORDER — HYDROMORPHONE HCL 1 MG/ML IJ SOLN
0.5000 mg | INTRAMUSCULAR | Status: DC | PRN
  Administered 2017-03-02: 0.5 mg via INTRAVENOUS
  Filled 2017-03-02 (×2): qty 1

## 2017-03-02 NOTE — Discharge Instructions (Signed)
Take the medications as needed for pain, follow-up with your primary care doctor next week to make sure the pain is improving and the infection has resolved

## 2017-03-02 NOTE — ED Notes (Signed)
Bed: EX51 Expected date:  Expected time:  Means of arrival:  Comments: 81 yo F/Abd pain

## 2017-03-02 NOTE — ED Provider Notes (Signed)
[facility] DEPT Provider Note   CSN: [id number] Arrival date & time: 03/02/17  8676     History   Chief Complaint Chief Complaint  Patient presents with  . Abdominal Pain  . Nausea    HPI [name] is a 81 y.o. female.  HPI Patient states she had some trouble with constipation earlier in the week but 2 days ago she took a laxative and had a good bowel movement.  A couple days ago the patient started having discomfort in her left hip. The pain is severe and increases with palpation and movement of her hip. She does not recall any falls or injuries. When the pain gets severe in her hip it almost picks her feel like she has to have a bowel movement. She also has noticed urinary urgency. Whenever she urinates she feels that she still has to go more. It does not sting or burn however. Yesterday she did not have a very good appetite and last night she started having episodes of dry heaving. Patient denies any abdominal pain. She states the pain is hurting in her hip.  She denies any fevers. No chest pain or shortness of breath. Past Medical History:  Diagnosis Date  . Anemia    "when I was young"  . Anginal pain ([place])   . Arthritis    "all over my body"   . Chronic bronchitis ([place])    "get it just about q yr" (06/14/2015)  . Exertional shortness of breath   . Frequent UTI    "recently" (06/14/2015)  . GERD (gastroesophageal reflux disease)   . Headache(784.0)    "probably every other day " (06/14/2015)  . High cholesterol   . History of blood transfusion ~ 1954   "related to my periods"  . Hypertension   . OSA (obstructive sleep apnea)    "I think I'm using a BiPAP" (06/14/2015)  . Type II diabetes mellitus [facility])     Patient Active Problem List   Diagnosis Date Noted  . Sepsis secondary to UTI ([place])   . Uncontrolled type 2 diabetes mellitus with complication ([place])   . Acute renal failure ([place])   . Chronic diastolic CHF (congestive heart failure) ([place])   . Acute  lower UTI 01/09/2017  . Sepsis ([place]) 01/09/2017  . Constipation 01/09/2017  . COPD (chronic obstructive pulmonary disease) ([place]) 01/09/2017  . Multiple falls 06/14/2015  . Weight loss 06/14/2015  . Acute respiratory failure with hypoxia ([place]) 06/14/2015  . Orthostasis 06/14/2015  . Acute respiratory failure ([place]) 06/14/2015  . Elevated lactic acid level 01/22/2015  . 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 Chronic diastolic heart failure, NYHA class 1 ([place]) 01/22/2015  . Tachycardia with 100 - 120 beats per minute 04/08/2014  . DM (diabetes mellitus) ([place]) 04/08/2014  . Chest pain 04/07/2014  . Hyperglycemia 12/11/2012  . Syncope and collapse 12/11/2012  . Dehydration, moderate 12/11/2012  . Noncompliance 12/11/2012  . UTI (lower urinary tract infection) 12/11/2012  . Chronic steroid use 12/11/2012  . Rheumatoid arthritis ([place]) 12/11/2012    Past Surgical History:  Procedure Laterality Date  . APPENDECTOMY  1969  . BACK SURGERY    . [place]  . FRACTURE SURGERY    . LAPAROSCOPIC CHOLECYSTECTOMY  1990's  . LUMBAR DISC SURGERY     "slipped disc" (12/11/2012)  . ORIF SHOULDER FRACTURE Left ~ 2009   "put cement down in it"   . TONSILLECTOMY  1950's?  Marland Kitchen VAGINAL HYSTERECTOMY  ~ 1973  OB History    No data available       Home Medications    Prior to Admission medications   Medication Sig Start Date End Date Taking? Authorizing Provider  aspirin EC 81 MG tablet Take 1 tablet (81 mg total) by mouth daily. 04/09/14  Yes Debbe Odea, MD  atorvastatin (LIPITOR) 40 MG tablet Take 40 mg by mouth at bedtime. 12/19/16  Yes [provider]  hydroxychloroquine (PLAQUENIL) 200 MG tablet Take 200 mg by mouth 2 (two) times daily. 05/15/15  Yes [provider]  JANUVIA 100 MG tablet Take 100 mg by mouth daily.  01/11/15  Yes [provider]  metFORMIN (GLUCOPHAGE) 500 MG tablet Take 500 mg by mouth 2 (two) times daily. 02/13/17  Yes  [provider]  methylPREDNISolone (MEDROL) 4 MG tablet Take 4 mg by mouth every morning.  05/21/15  Yes [provider]  omeprazole (PRILOSEC) 20 MG capsule Take 20 mg by mouth 2 (two) times daily. 12/17/16  Yes [provider]  traMADol (ULTRAM) 50 MG tablet Take 50 mg by mouth 2 (two) times daily as needed for pain.  05/04/12  Yes Barbara Cower, MD  Cefixime (SUPRAX) 400 MG CAPS capsule Take 1 capsule (400 mg total) by mouth daily. Patient not taking: Reported on 03/02/2017 01/11/17   Allie Bossier, MD  cephALEXin (KEFLEX) 250 MG capsule Take 1 capsule (250 mg total) by mouth 4 (four) times daily. 03/02/17 03/09/17  Dorie Rank, MD  HYDROcodone-acetaminophen (NORCO/VICODIN) 5-325 MG tablet Take 1 tablet by mouth every 6 (six) hours as needed. 03/02/17   Dorie Rank, MD  insulin glargine (LANTUS) 100 unit/mL SOPN Inject 0.06 mLs (6 Units total) into the skin at bedtime. Patient not taking: Reported on 03/02/2017 01/11/17   Allie Bossier, MD  Insulin Pen Needle (PEN NEEDLES 29GX1/2") 29G X 12MM MISC 6 Units by Does not apply route at bedtime. Patient not taking: Reported on 03/02/2017 01/11/17   Allie Bossier, MD    Family History Family History  Problem Relation Age of Onset  . Hypertension Mother   . Heart attack Father   . Diabetes Mellitus II Sister   . Hypertension Sister   . Cancer - Other Sister     Social History Social History  Substance Use Topics  . Smoking status: Never Smoker  . Smokeless tobacco: Never Used     Comment: 04/07/2014"1 pack of cigarettes would last 3-4 months; haven't had any cigarettes for 40 years"  . Alcohol use Yes     Comment: "used to drink beer in the 1990's every once in awhile"     Allergies   Patient has no known allergies.   Review of Systems Review of Systems  All other systems reviewed and are negative.    Physical Exam Updated Vital Signs BP 138/70 (BP Location: Right Arm)   Pulse 80   Temp 98.3 F (36.8  C) (Oral)   Resp 18   Ht 1.626 m (5\' 4" )   Wt 62.6 kg (138 lb)   SpO2 100%   BMI 23.69 kg/m   Physical Exam  Constitutional: No distress.  Elderly, frail  HENT:  Head: Normocephalic and atraumatic.  Right Ear: External ear normal.  Left Ear: External ear normal.  Eyes: Conjunctivae are normal. Right eye exhibits no discharge. Left eye exhibits no discharge. No scleral icterus.  Neck: Neck supple. No tracheal deviation present.  Cardiovascular: Normal rate, regular rhythm and intact distal pulses.   Pulmonary/Chest:  Effort normal and breath sounds normal. No stridor. No respiratory distress. She has no wheezes. She has no rales.  Abdominal: Soft. Bowel sounds are normal. She exhibits no distension and no mass. There is no tenderness. There is no rebound and no guarding.  Genitourinary:  Genitourinary Comments: Discussed doing a rectal exam considering the patient's constipation, patient refused, she states she had a normal bowel movement after the laxative, she doesn't feel constipated anymore  Musculoskeletal: She exhibits no edema.       Left hip: She exhibits tenderness. She exhibits no bony tenderness, no swelling and no deformity.       Lumbar back: Normal.  Neurological: She is alert. She has normal strength. No cranial nerve deficit (no facial droop, extraocular movements intact, no slurred speech) or sensory deficit. She exhibits normal muscle tone. She displays no seizure activity. Coordination normal.  Skin: Skin is warm and dry. No rash noted. She is not diaphoretic.  Psychiatric: She has a normal mood and affect.  Nursing note and vitals reviewed.    ED Treatments / Results  Labs (all labs ordered are listed, but only abnormal results are displayed) Labs Reviewed  COMPREHENSIVE METABOLIC PANEL - Abnormal; Notable for the following:       Result Value   Total Protein 6.4 (*)    Albumin 3.4 (*)    GFR calc non Af Amer 57 (*)    All other components within normal  limits  LIPASE, BLOOD - Abnormal; Notable for the following:    Lipase 53 (*)    All other components within normal limits  URINALYSIS, ROUTINE W REFLEX MICROSCOPIC - Abnormal; Notable for the following:    APPearance HAZY (*)    Hgb urine dipstick SMALL (*)    Ketones, ur 20 (*)    Protein, ur 30 (*)    Leukocytes, UA LARGE (*)    Bacteria, UA MANY (*)    Squamous Epithelial / LPF 0-5 (*)    All other components within normal limits  URINE CULTURE  CBC WITH DIFFERENTIAL/PLATELET      Radiology Dg Abdomen Acute W/chest  Result Date: 03/02/2017 CLINICAL DATA:  Sudden onset of left hip pain and lower abdominal pain. History of cholecystectomy. EXAM: DG ABDOMEN ACUTE W/ 1V CHEST COMPARISON:  01/08/2017 and 10/06/2015 FINDINGS: Subtle densities at the left lung basal some of which may be chronic. Otherwise, the lungs are clear. Chronic calcification in left upper abdomen. Normal appearance of the heart and mediastinum. Trachea is midline. Negative for free air. Again noted is bone cement in the T11 and T12 vertebral bodies. Nonobstructive bowel gas pattern. Chronic degenerative changes in the lumbar spine. Phleboliths in the pelvis. Pelvic bony ring is intact. No gross abnormality to the left hip. IMPRESSION: No acute abdominal findings. Subtle densities at the left lung base, some of which may be chronic. Findings could represent atelectasis or subtle infectious process. Electronically Signed   By: Markus Daft M.D.   On: 03/02/2017 07:57   Dg Hip Unilat W Or Wo Pelvis 2-3 Views Left  Result Date: 03/02/2017 CLINICAL DATA:  Sudden onset LEFT hip pain EXAM: DG HIP (WITH OR WITHOUT PELVIS) 2-3V LEFT COMPARISON:  None. FINDINGS: Hips are located. No evidence of pelvic fracture or sacral fracture. Dedicated view of the LEFT hip demonstrates no femoral neck fracture. There is mild sclerosis of the acetabular roofs and medial joint space narrowing. Findings are symmetric. IMPRESSION: No fracture of the  pelvis or LEFT hip. 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 Electronically Signed   By: Suzy Bouchard M.D.   On: 03/02/2017 07:59    Procedures Procedures (including critical care time)  Medications Ordered in ED Medications  sodium chloride 0.9 % bolus 1,000 mL (0 mLs Intravenous Stopped 03/02/17 0953)    And  0.9 %  sodium chloride infusion ( Intravenous New Bag/Given 03/02/17 0724)  HYDROmorphone (DILAUDID) injection 0.5 mg (0.5 mg Intravenous Given 03/02/17 0724)  cefTRIAXone (ROCEPHIN) 1 g in dextrose 5 % 50 mL IVPB (not administered)  ondansetron (ZOFRAN) injection 4 mg (4 mg Intravenous Given 03/02/17 0724)     Initial Impression / Assessment and Plan / ED Course  I have reviewed the triage vital signs and the nursing notes.  Pertinent labs & imaging results that were available during my care of the patient were reviewed by me and considered in my medical decision making (see chart for details).   the patient's hip pain seems to be musculoskeletal in nature. There is no findings to suggest infection on exam. X-ray show arthritic changes. It's possible her symptoms are related to the arthritis in her hip versus a component of radicular lumbosacral pain.  Patient's abdominal exam is benign. No focal tenderness to suggest diverticulitis or colitis. Urinalysis is abnormal and the patient is having urinary symptoms. She is afebrile and nontoxic. I think she is safe for outpatient treatment.  Discussed follow-up with her primary care doctor.  Final Clinical Impressions(s) / ED Diagnoses   Final diagnoses:  Pain of left hip joint  Acute cystitis without hematuria    New Prescriptions New Prescriptions   CEPHALEXIN (KEFLEX) 250 MG CAPSULE    Take 1 capsule (250 mg total) by mouth 4 (four) times daily.   HYDROCODONE-ACETAMINOPHEN (NORCO/VICODIN) 5-325 MG TABLET    Take 1 tablet by mouth every 6 (six) hours as needed.     Dorie Rank, MD 03/02/17 1102

## 2017-03-02 NOTE — ED Triage Notes (Signed)
Pt brought in by EMS from home complaining of abdominal pain. Per EMS patient stated her last BM was 1 week ago.  Patient complain of severe pain mainly on her LLQ.

## 2017-03-04 LAB — URINE CULTURE

## 2017-03-05 ENCOUNTER — Telehealth: Payer: Self-pay | Admitting: Emergency Medicine

## 2017-03-05 NOTE — Telephone Encounter (Signed)
Post ED Visit - Positive Culture Follow-up  Culture report reviewed by antimicrobial stewardship pharmacist:  []  Elenor Quinones, Pharm.D. []  Heide Guile, Pharm.D., BCPS AQ-ID []  Parks Neptune, Pharm.D., BCPS []  Alycia Rossetti, Pharm.D., BCPS []  [place], Pharm.D., BCPS, AAHIVP []  Legrand Como, Pharm.D., BCPS, AAHIVP []  Salome Arnt, PharmD, BCPS []  Dimitri Ped, PharmD, BCPS []  Vincenza Hews, PharmD, BCPS [facility] PharmD  Positive urine culture Treated with cephalexin, organism sensitive to the same and no further patient follow-up is required at this time.  Hazle Nordmann 03/05/2017, 1:03 PM

## 2017-04-03 ENCOUNTER — Emergency Department (HOSPITAL_COMMUNITY)
Admission: EM | Admit: 2017-04-03 | Discharge: 2017-04-03 | Disposition: A | Discharge status: Home / Self Care | Payer: Medicare Other | Attending: Emergency Medicine | Admitting: Emergency Medicine

## 2017-04-03 ENCOUNTER — Emergency Department (HOSPITAL_COMMUNITY): Payer: Medicare Other

## 2017-04-03 ENCOUNTER — Encounter (HOSPITAL_COMMUNITY): Payer: Self-pay | Admitting: *Deleted

## 2017-04-03 DIAGNOSIS — N3090 Cystitis, unspecified without hematuria: Secondary | ICD-10-CM | POA: Diagnosis not present

## 2017-04-03 DIAGNOSIS — R0789 Other chest pain: Secondary | ICD-10-CM | POA: Diagnosis not present

## 2017-04-03 DIAGNOSIS — E119 Type 2 diabetes mellitus without complications: Secondary | ICD-10-CM | POA: Insufficient documentation

## 2017-04-03 DIAGNOSIS — Z7984 Long term (current) use of oral hypoglycemic drugs: Secondary | ICD-10-CM | POA: Diagnosis not present

## 2017-04-03 DIAGNOSIS — Z7982 Long term (current) use of aspirin: Secondary | ICD-10-CM | POA: Diagnosis not present

## 2017-04-03 DIAGNOSIS — Z79899 Other long term (current) drug therapy: Secondary | ICD-10-CM | POA: Diagnosis not present

## 2017-04-03 DIAGNOSIS — R079 Chest pain, unspecified: Secondary | ICD-10-CM | POA: Diagnosis present

## 2017-04-03 LAB — BASIC METABOLIC PANEL
Anion gap: 14 (ref 5–15)
BUN: 11 mg/dL (ref 6–20)
CO2: 26 mmol/L (ref 22–32)
Calcium: 9.5 mg/dL (ref 8.9–10.3)
Chloride: 98 mmol/L — ABNORMAL LOW (ref 101–111)
Creatinine, Ser: 1.19 mg/dL — ABNORMAL HIGH (ref 0.44–1.00)
GFR calc Af Amer: 49 mL/min — ABNORMAL LOW (ref >60)
GFR calc non Af Amer: 42 mL/min — ABNORMAL LOW (ref >60)
Glucose, Bld: 128 mg/dL — ABNORMAL HIGH (ref 65–99)
Potassium: 4.1 mmol/L (ref 3.5–5.1)
Sodium: 138 mmol/L (ref 135–145)

## 2017-04-03 LAB — I-STAT CG4 LACTIC ACID, ED: Lactic Acid, Venous: 2.05 mmol/L (ref 0.5–1.9)

## 2017-04-03 LAB — URINALYSIS, ROUTINE W REFLEX MICROSCOPIC
BILIRUBIN URINE: NEGATIVE
Glucose, UA: 150 mg/dL — AB
Hgb urine dipstick: NEGATIVE
Ketones, ur: NEGATIVE mg/dL
Nitrite: NEGATIVE
PH: 7 (ref 5.0–8.0)
Protein, ur: NEGATIVE mg/dL
SPECIFIC GRAVITY, URINE: 1.009 (ref 1.005–1.030)

## 2017-04-03 LAB — CBC
HCT: 42.6 % (ref 36.0–46.0)
Hemoglobin: 14.2 g/dL (ref 12.0–15.0)
MCH: 28.5 pg (ref 26.0–34.0)
MCHC: 33.3 g/dL (ref 30.0–36.0)
MCV: 85.4 fL (ref 78.0–100.0)
Platelets: 283 10*3/uL (ref 150–400)
RBC: 4.99 MIL/uL (ref 3.87–5.11)
RDW: 15.1 % (ref 11.5–15.5)
WBC: 11.1 10*3/uL — ABNORMAL HIGH (ref 4.0–10.5)

## 2017-04-03 LAB — I-STAT TROPONIN, ED
Troponin i, poc: 0 ng/mL (ref 0.00–0.08)
Troponin i, poc: 0.02 ng/mL (ref 0.00–0.08)

## 2017-04-03 LAB — LACTIC ACID, PLASMA: Lactic Acid, Venous: 2.2 mmol/L (ref 0.5–1.9)

## 2017-04-03 MED ORDER — SODIUM CHLORIDE 0.9 % IV BOLUS (SEPSIS)
1890.0000 mL | Freq: Once | INTRAVENOUS | Status: AC
  Administered 2017-04-03: 1890 mL via INTRAVENOUS

## 2017-04-03 MED ORDER — DEXTROSE 5 % IV SOLN
1.0000 g | Freq: Once | INTRAVENOUS | Status: AC
  Administered 2017-04-03: 1 g via INTRAVENOUS
  Filled 2017-04-03: qty 10

## 2017-04-03 MED ORDER — IBUPROFEN 200 MG PO TABS
600.0000 mg | ORAL_TABLET | Freq: Once | ORAL | Status: AC
  Administered 2017-04-03: 600 mg via ORAL
  Filled 2017-04-03: qty 3

## 2017-04-03 MED ORDER — CEPHALEXIN 500 MG PO CAPS: 500.0000 mg | ORAL_CAPSULE | Freq: Two times a day (BID) | ORAL | 0 refills | Status: DC

## 2017-04-03 NOTE — ED Triage Notes (Signed)
Pt complains of shortness of breath, worse since last night, chest pain for the past 2 days. Pt states she had similar  issue in the past with tachycardia and had to be admitted to the hospital.

## 2017-04-03 NOTE — Discharge Instructions (Addendum)
Please read and follow all provided instructions You have been seen today for your complaint of pain with urination. Your lab work showed urine infection. Your evaluation for your heart was reassuring. Please follow up with your PCP within 48 hours in regards to today's visit.   Your discharge medications include: 1) Antibiotic.   Please take all of your antibiotics until finished!   You may develop abdominal discomfort or diarrhea from the antibiotic.  You may help offset this with probiotics which you can buy or get in yogurt. Do not eat or take the probiotics until 2 hours after your antibiotic. Do not take your medicine if develop an itchy rash, swelling in your mouth or lips, or difficulty breathing.   Home care instructions are as follows:  1) Please drink plenty of water. Avoid tea and beverages with caffeine like coffee or soda 2) If you are sexually active, make sure to urinate immediately after intercourse.  Follow up:  Please follow up with your primary care physician in 1-2 days. If you do not have one please call the [facility] number listed above. Please seek immediate medical care if you develop any of the following symptoms: SEEK MEDICAL CARE IF:  You have back pain.  You develop a fever.  Your symptoms do not begin to resolve within 3 days.  SEEK IMMEDIATE MEDICAL CARE IF:  You have severe back pain or lower abdominal pain.  You develop chills.  You have nausea or vomiting.  You have continued burning or discomfort with urination even after completion of antibiotic. Return to the ED is CP becomes exertional, associated with diaphoresis or nausea, radiates to left jaw/arm, worsens or becomes concerning in any way.

## 2017-04-03 NOTE — ED Provider Notes (Signed)
[facility] DEPT Provider Note   CSN: [id number] Arrival date & time: 04/03/17  1342     History   Chief Complaint Chief Complaint  Patient presents with  . Chest Pain  . Shortness of Breath    HPI [name] is a 81 y.o. female past medical history of chronic bronchitis, anemia, GERD, hypertension, hyperlipidemia and diabetes presents to the emergency department today for chief complaint of chest pain and shortness of breath. Patient states that she has baseline 3/10 central chest pain that "has been there for years". Last night at ~1030 when the patient laid down she felt a central chest pain with shortness of breath. No radiation to arm, jaw or either shoulders. This was relieved with sitting up. Also complains of burping and belching with lying down. No large meal last night prior to lying down. CP not related with exertion. Has reoccurred once after the patient laid back down later that night. She has had mild SOB since last night. Tried 2 bouts of inhaler treatment for this without relief. Says she has had underlying nausea for a few weeks but no increase with CP. No abdominal pain. Also complains of itchy rash on upper right back and abdomen x 1 week. Says she was given cream and antibiotics by PCP for this. Reports urinary frequency without dysuria, urgency or hematuria. She denies fever, chills, recent illness, exogenous estrogen use, recent surgery or travel, trauma, immobilization, previous blood clot, cough, hemoptysis, lower extremity pain or swelling.   HPI  Past Medical History:  Diagnosis Date  . Anemia    "when I was young"  . Anginal pain ([place])   . Arthritis    "all over my body"   . Chronic bronchitis ([place])    "get it just about q yr" (06/14/2015)  . Exertional shortness of breath   . Frequent UTI    "recently" (06/14/2015)  . GERD (gastroesophageal reflux disease)   . Headache(784.0)    "probably every other day " (06/14/2015)  . High cholesterol   .  History of blood transfusion ~ 1954   "related to my periods"  . Hypertension   . OSA (obstructive sleep apnea)    "I think I'm using a BiPAP" (06/14/2015)  . Type II diabetes mellitus [facility])     Patient Active Problem List   Diagnosis Date Noted  . Sepsis secondary to UTI ([place])   . Uncontrolled type 2 diabetes mellitus with complication ([place])   . Acute renal failure ([place])   . Chronic diastolic CHF (congestive heart failure) ([place])   . Acute lower UTI 01/09/2017  . Sepsis ([place]) 01/09/2017  . Constipation 01/09/2017  . COPD (chronic obstructive pulmonary disease) ([place]) 01/09/2017  . Multiple falls 06/14/2015  . Weight loss 06/14/2015  . Acute respiratory failure with hypoxia ([place]) 06/14/2015  . Orthostasis 06/14/2015  . Acute respiratory failure ([place]) 06/14/2015  . Elevated lactic acid level 01/22/2015  . 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 Chronic diastolic heart failure, NYHA class 1 ([place]) 01/22/2015  . Tachycardia with 100 - 120 beats per minute 04/08/2014  . DM (diabetes mellitus) ([place]) 04/08/2014  . Chest pain 04/07/2014  . Hyperglycemia 12/11/2012  . Syncope and collapse 12/11/2012  . Dehydration, moderate 12/11/2012  . Noncompliance 12/11/2012  . UTI (lower urinary tract infection) 12/11/2012  . Chronic steroid use 12/11/2012  . Rheumatoid arthritis ([place]) 12/11/2012    Past Surgical History:  Procedure Laterality Date  . APPENDECTOMY  1969  . BACK SURGERY    .  [place]  . FRACTURE SURGERY    . LAPAROSCOPIC CHOLECYSTECTOMY  1990's  . LUMBAR DISC SURGERY     "slipped disc" (12/11/2012)  . ORIF SHOULDER FRACTURE Left ~ 2009   "put cement down in it"   . TONSILLECTOMY  1950's?  Marland Kitchen VAGINAL HYSTERECTOMY  ~ 1973    OB History    No data available       Home Medications    Prior to Admission medications   Medication Sig Start Date End Date Taking? Authorizing Provider  aspirin EC 81 MG tablet Take 1 tablet (81 mg total) by mouth daily.  04/09/14  Yes Debbe Odea, MD  atorvastatin (LIPITOR) 40 MG tablet Take 40 mg by mouth at bedtime. 12/19/16  Yes [provider]  hydroxychloroquine (PLAQUENIL) 200 MG tablet Take 200 mg by mouth 2 (two) times daily. 05/15/15  Yes [provider]  JANUVIA 100 MG tablet Take 100 mg by mouth daily.  01/11/15  Yes [provider]  metFORMIN (GLUCOPHAGE) 500 MG tablet Take 500 mg by mouth 2 (two) times daily. 02/13/17  Yes [provider]  methylPREDNISolone (MEDROL) 4 MG tablet Take 4 mg by mouth every morning.  05/21/15  Yes [provider]  omeprazole (PRILOSEC) 20 MG capsule Take 20 mg by mouth 2 (two) times daily. 12/17/16  Yes [provider]  traMADol (ULTRAM) 50 MG tablet Take 50 mg by mouth 2 (two) times daily as needed for pain.  05/04/12  Yes Barbara Cower, MD  Cefixime (SUPRAX) 400 MG CAPS capsule Take 1 capsule (400 mg total) by mouth daily. Patient not taking: Reported on 03/02/2017 01/11/17   Allie Bossier, MD  HYDROcodone-acetaminophen (NORCO/VICODIN) 5-325 MG tablet Take 1 tablet by mouth every 6 (six) hours as needed. Patient not taking: Reported on 04/03/2017 03/02/17   Dorie Rank, MD  insulin glargine (LANTUS) 100 unit/mL SOPN Inject 0.06 mLs (6 Units total) into the skin at bedtime. Patient not taking: Reported on 03/02/2017 01/11/17   Allie Bossier, MD  Insulin Pen Needle (PEN NEEDLES 29GX1/2") 29G X 12MM MISC 6 Units by Does not apply route at bedtime. Patient not taking: Reported on 03/02/2017 01/11/17   Allie Bossier, MD    Family History Family History  Problem Relation Age of Onset  . Hypertension Mother   . Heart attack Father   . Diabetes Mellitus II Sister   . Hypertension Sister   . Cancer - Other Sister     Social History Social History  Substance Use Topics  . Smoking status: Never Smoker  . Smokeless tobacco: Never Used     Comment: 04/07/2014"1 pack of cigarettes would last 3-4 months; haven't had any  cigarettes for 40 years"  . Alcohol use Yes     Comment: "used to drink beer in the 1990's every once in awhile"     Allergies   Patient has no known allergies.   Review of Systems Review of Systems  All other systems reviewed and are negative.    Physical Exam Updated Vital Signs BP 93/74   Pulse (!) 113   Temp 98.1 F (36.7 C) (Oral)   Resp 15   SpO2 98%   Physical Exam  Constitutional: She appears well-developed and well-nourished.  Non-toxic appearing  HENT:  Head: Normocephalic and atraumatic.  Right Ear: External ear normal.  Left Ear: External ear normal.  Nose: Nose normal.  Mouth/Throat: Uvula is midline, oropharynx is clear and moist and mucous  membranes are normal. No tonsillar exudate.  Eyes: Pupils are equal, round, and reactive to light. Right eye exhibits no discharge. Left eye exhibits no discharge. No scleral icterus.  Neck: Trachea normal and normal range of motion. Neck supple. No JVD present. No spinous process tenderness present. No neck rigidity. Normal range of motion present.  Cardiovascular: Regular rhythm and intact distal pulses.  Tachycardia present.   No murmur heard. Pulses:      Radial pulses are 2+ on the right side, and 2+ on the left side.       Dorsalis pedis pulses are 2+ on the right side, and 2+ on the left side.       Posterior tibial pulses are 2+ on the right side, and 2+ on the left side.  No lower extremity swelling or edema. Calves symmetric in size bilaterally.  Pulmonary/Chest: Effort normal and breath sounds normal. She exhibits tenderness (central).  Abdominal: Soft. Bowel sounds are normal. She exhibits no distension. There is no tenderness. There is no rebound, no guarding and no CVA tenderness.  Musculoskeletal: She exhibits no edema.  Lymphadenopathy:    She has no cervical adenopathy.  Neurological: She is alert. She has normal strength. No cranial nerve deficit or sensory deficit.  Skin: Skin is warm and dry. No  rash noted. She is not diaphoretic.  Psychiatric: She has a normal mood and affect.  Nursing note and vitals reviewed.    ED Treatments / Results  Labs (all labs ordered are listed, but only abnormal results are displayed) Labs Reviewed  BASIC METABOLIC PANEL - Abnormal; Notable for the following:       Result Value   Chloride 98 (*)    Glucose, Bld 128 (*)    Creatinine, Ser 1.19 (*)    GFR calc non Af Amer 42 (*)    GFR calc Af Amer 49 (*)    All other components within normal limits  CBC - Abnormal; Notable for the following:    WBC 11.1 (*)    All other components within normal limits  URINALYSIS, ROUTINE W REFLEX MICROSCOPIC - Abnormal; Notable for the following:    APPearance HAZY (*)    Glucose, UA 150 (*)    Leukocytes, UA LARGE (*)    Bacteria, UA MANY (*)    Squamous Epithelial / LPF 0-5 (*)    All other components within normal limits  LACTIC ACID, PLASMA - Abnormal; Notable for the following:    Lactic Acid, Venous 2.2 (*)    All other components within normal limits  I-STAT CG4 LACTIC ACID, ED - Abnormal; Notable for the following:    Lactic Acid, Venous 2.05 (*)    All other components within normal limits  CULTURE, BLOOD (ROUTINE X 2)  CULTURE, BLOOD (ROUTINE X 2)  I-STAT TROPONIN, ED  I-STAT TROPONIN, ED  I-STAT CG4 LACTIC ACID, ED    EKG  EKG Interpretation  Date/Time:  Thursday April 03 2017 13:56:16 EDT Ventricular Rate:  125 PR Interval:    QRS Duration: 80 QT Interval:  319 QTC Calculation: 460 R Axis:   -48 Text Interpretation:  Sinus tachycardia Confirmed by Lajean Saver [phone]) on 04/03/2017 2:29:42 PM       Radiology Dg Chest 2 View  Result Date: 04/03/2017 CLINICAL DATA:  Chest pain and shortness of breath for 2 days. EXAM: CHEST  2 VIEW COMPARISON:  03/02/2017 FINDINGS: The heart size and mediastinal contours are within normal limits. Both lungs are clear. No evidence  of pneumothorax or pleural effusion. Lower thoracic  vertebroplasties again noted. IMPRESSION: No active cardiopulmonary disease. Electronically Signed   By: Earle Gell M.D.   On: 04/03/2017 14:50    Procedures Procedures (including critical care time)  Medications Ordered in ED Medications  cefTRIAXone (ROCEPHIN) 1 g in dextrose 5 % 50 mL IVPB (not administered)  ibuprofen (ADVIL,MOTRIN) tablet 600 mg (600 mg Oral Given 04/03/17 1606)  sodium chloride 0.9 % bolus 1,890 mL (1,890 mLs Intravenous New Bag/Given 04/03/17 1800)     Initial Impression / Assessment and Plan / ED Course  I have reviewed the triage vital signs and the nursing notes.  Pertinent labs & imaging results that were available during my care of the patient were reviewed by me and considered in my medical decision making (see chart for details).     81 y.o. female presenting with chest pain and shortness of breath with lying down, increased urinary frequency and urgency. Patient chest pain only occurs with lying down last night with associated sob. Relieved with sitting up. Associated burping and belching. States she has had this pain for "years". Patient also notes urinary frequency and urgency. No dysuria. No abdominal pain. Patient is noted to be tachycardic. On review patient appears to be chronically tachycardic.   Lactic acid 2.2. UA with UTI changes. No CVA tenderness. Afebrile. Cr similar to prior. WBC mildly elevated at 11.1. IVF and Rocephin given in department.   Chest pain is not likely of cardiac or pulmonary etiology d/t presentation, no tracheal deviation, no JVD or new murmur, RRR, breath sounds equal bilaterally, EKG without acute abnormalities, negative troponin x 2, and negative CXR. Pt has been advised start a PPI.   I had a discussion with the patient about inpatient vs outpatient treatment and she would like outpatient treatment. I spoke with Dr. Wilson Singer in regards to this and he feels the patient will do well on an outpatient basis. Advised close follow up  with PCP this week to ensure things are improving. I advised the patient to return to the emergency department with new or worsening symptoms or new concerns. Specific return precautions discussed. Patient to return to the ED is CP becomes exertional, associated with diaphoresis or nausea, radiates to left jaw/arm, worsens or becomes concerning in any way.   The patient verbalized understanding and agreement with plan. All questions answered. No further questions at this time. The patient is hemodynamically stable, mentating appropriately and appears safe for discharge.  Case has been discussed with and seen by Dr. Wilson Singer who agrees with the above plan to discharge.    Final Clinical Impressions(s) / ED Diagnoses   Final diagnoses:  Cystitis  Atypical chest pain    New Prescriptions New Prescriptions   No medications on file     Lorelle Gibbs 04/03/17 Marko Stai    Virgel Manifold, MD 04/20/17 [phone]

## 2017-04-03 NOTE — ED Notes (Signed)
Discharge instructions reviewed with patient. Patient verbalizes understanding. VSS.   

## 2017-04-08 LAB — CULTURE, BLOOD (ROUTINE X 2)
Culture: NO GROWTH
Culture: NO GROWTH
SPECIAL REQUESTS: ADEQUATE
Special Requests: ADEQUATE

## 2017-05-05 ENCOUNTER — Emergency Department (HOSPITAL_COMMUNITY)
Admission: EM | Admit: 2017-05-05 | Discharge: 2017-05-05 | Disposition: A | Discharge status: Home / Self Care | Payer: Medicare Other | Attending: Emergency Medicine | Admitting: Emergency Medicine

## 2017-05-05 ENCOUNTER — Encounter (HOSPITAL_COMMUNITY): Payer: Self-pay | Admitting: Emergency Medicine

## 2017-05-05 ENCOUNTER — Emergency Department (HOSPITAL_COMMUNITY): Payer: Medicare Other

## 2017-05-05 DIAGNOSIS — J449 Chronic obstructive pulmonary disease, unspecified: Secondary | ICD-10-CM | POA: Diagnosis not present

## 2017-05-05 DIAGNOSIS — R630 Anorexia: Secondary | ICD-10-CM | POA: Insufficient documentation

## 2017-05-05 DIAGNOSIS — I5032 Chronic diastolic (congestive) heart failure: Secondary | ICD-10-CM | POA: Insufficient documentation

## 2017-05-05 DIAGNOSIS — Z87891 Personal history of nicotine dependence: Secondary | ICD-10-CM | POA: Insufficient documentation

## 2017-05-05 DIAGNOSIS — R109 Unspecified abdominal pain: Secondary | ICD-10-CM | POA: Insufficient documentation

## 2017-05-05 DIAGNOSIS — E119 Type 2 diabetes mellitus without complications: Secondary | ICD-10-CM | POA: Insufficient documentation

## 2017-05-05 DIAGNOSIS — Z7982 Long term (current) use of aspirin: Secondary | ICD-10-CM | POA: Diagnosis not present

## 2017-05-05 DIAGNOSIS — I110 Hypertensive heart disease with heart failure: Secondary | ICD-10-CM | POA: Insufficient documentation

## 2017-05-05 DIAGNOSIS — Z79899 Other long term (current) drug therapy: Secondary | ICD-10-CM | POA: Diagnosis not present

## 2017-05-05 DIAGNOSIS — Z794 Long term (current) use of insulin: Secondary | ICD-10-CM | POA: Insufficient documentation

## 2017-05-05 DIAGNOSIS — R110 Nausea: Secondary | ICD-10-CM

## 2017-05-05 DIAGNOSIS — R112 Nausea with vomiting, unspecified: Secondary | ICD-10-CM | POA: Insufficient documentation

## 2017-05-05 LAB — COMPREHENSIVE METABOLIC PANEL
ALBUMIN: 3.6 g/dL (ref 3.5–5.0)
ALT: 20 U/L (ref 14–54)
ANION GAP: 13 (ref 5–15)
AST: 43 U/L — ABNORMAL HIGH (ref 15–41)
Alkaline Phosphatase: 54 U/L (ref 38–126)
BILIRUBIN TOTAL: 1 mg/dL (ref 0.3–1.2)
BUN: 7 mg/dL (ref 6–20)
CHLORIDE: 95 mmol/L — AB (ref 101–111)
CO2: 32 mmol/L (ref 22–32)
Calcium: 8.8 mg/dL — ABNORMAL LOW (ref 8.9–10.3)
Creatinine, Ser: 0.91 mg/dL (ref 0.44–1.00)
GFR calc Af Amer: >60 mL/min (ref >60)
GFR, EST NON AFRICAN AMERICAN: 58 mL/min — AB (ref >60)
GLUCOSE: 78 mg/dL (ref 65–99)
POTASSIUM: 3.5 mmol/L (ref 3.5–5.1)
Sodium: 140 mmol/L (ref 135–145)
TOTAL PROTEIN: 6.9 g/dL (ref 6.5–8.1)

## 2017-05-05 LAB — URINALYSIS, ROUTINE W REFLEX MICROSCOPIC
BILIRUBIN URINE: NEGATIVE
Glucose, UA: 50 mg/dL — AB
HGB URINE DIPSTICK: NEGATIVE
KETONES UR: 20 mg/dL — AB
NITRITE: NEGATIVE
PROTEIN: NEGATIVE mg/dL
Specific Gravity, Urine: >1.046 — ABNORMAL HIGH (ref 1.005–1.030)
pH: 7 (ref 5.0–8.0)

## 2017-05-05 LAB — CBC
HCT: 43 % (ref 36.0–46.0)
HEMOGLOBIN: 14.1 g/dL (ref 12.0–15.0)
MCH: 29.1 pg (ref 26.0–34.0)
MCHC: 32.8 g/dL (ref 30.0–36.0)
MCV: 88.8 fL (ref 78.0–100.0)
Platelets: 286 10*3/uL (ref 150–400)
RBC: 4.84 MIL/uL (ref 3.87–5.11)
RDW: 15.1 % (ref 11.5–15.5)
WBC: 9.2 10*3/uL (ref 4.0–10.5)

## 2017-05-05 LAB — LIPASE, BLOOD: Lipase: 36 U/L (ref 11–51)

## 2017-05-05 MED ORDER — SODIUM CHLORIDE 0.9 % IV SOLN
Freq: Once | INTRAVENOUS | Status: AC
  Administered 2017-05-05: 100 mL/h via INTRAVENOUS

## 2017-05-05 MED ORDER — IOPAMIDOL (ISOVUE-300) INJECTION 61%
INTRAVENOUS | Status: AC
  Administered 2017-05-05: 11:00:00
  Filled 2017-05-05: qty 100

## 2017-05-05 MED ORDER — ONDANSETRON HCL 4 MG/2ML IJ SOLN
4.0000 mg | Freq: Once | INTRAMUSCULAR | Status: AC
  Administered 2017-05-05: 4 mg via INTRAVENOUS
  Filled 2017-05-05: qty 2

## 2017-05-05 MED ORDER — ONDANSETRON HCL 4 MG/2ML IJ SOLN
4.0000 mg | Freq: Once | INTRAMUSCULAR | Status: AC | PRN
  Administered 2017-05-05: 4 mg via INTRAVENOUS
  Filled 2017-05-05: qty 2

## 2017-05-05 MED ORDER — METOCLOPRAMIDE HCL 5 MG PO TABS: 5.0000 mg | ORAL_TABLET | Freq: Three times a day (TID) | ORAL | 0 refills | Status: DC

## 2017-05-05 MED ORDER — MORPHINE SULFATE (PF) 4 MG/ML IV SOLN
4.0000 mg | INTRAVENOUS | Status: DC | PRN
  Administered 2017-05-05: 4 mg via INTRAVENOUS
  Filled 2017-05-05: qty 1

## 2017-05-05 MED ORDER — IOPAMIDOL (ISOVUE-300) INJECTION 61%
100.0000 mL | Freq: Once | INTRAVENOUS | Status: AC | PRN
  Administered 2017-05-05: 100 mL via INTRAVENOUS

## 2017-05-05 NOTE — ED Triage Notes (Signed)
Patient reports that she has been vomiting for 2 weeks and when she starts vomiting she gets "choked and SOB".  Patient had several teeth pulled several days ago and only been eating soft foods.

## 2017-05-05 NOTE — ED Notes (Signed)
Bed: WA12 Expected date:  Expected time:  Means of arrival:  Comments: 

## 2017-05-05 NOTE — Discharge Instructions (Signed)
Small, frequent meals. Reglan prescription for nausea, and to help stomach empty. Call [facility] for appointment. You may need to have a test called an endoscopy if your symptoms do not improve

## 2017-05-05 NOTE — ED Notes (Signed)
Charge RN to look for ultrasound IV 

## 2017-05-05 NOTE — ED Provider Notes (Signed)
[facility] DEPT Provider Note   CSN: [id number] Arrival date & time: 05/05/17  5361     History   Chief Complaint Chief Complaint  Patient presents with  . Emesis    HPI [name] is a 81 y.o. female. Chief complaint is 8 weeks of weight loss, abdominal pain, vomiting.  HPI:  81 year old female. She states for the last 2 months she vomits almost every day. She feels like food goes down. She does not feel as though it gets hot or stomach. However she states within an hour or 2 she vomits. She states she has lost "a lot of weight". No night sweats. No cough. No chest pain. Normal bowel habits. No diarrhea. Has not been constipated. No dysuria or urinary symptoms.  Past Medical History:  Diagnosis Date  . Anemia    "when I was young"  . Anginal pain ([place])   . Arthritis    "all over my body"   . Chronic bronchitis ([place])    "get it just about q yr" (06/14/2015)  . Exertional shortness of breath   . Frequent UTI    "recently" (06/14/2015)  . GERD (gastroesophageal reflux disease)   . Headache(784.0)    "probably every other day " (06/14/2015)  . High cholesterol   . History of blood transfusion ~ 1954   "related to my periods"  . Hypertension   . OSA (obstructive sleep apnea)    "I think I'm using a BiPAP" (06/14/2015)  . Type II diabetes mellitus [facility])     Patient Active Problem List   Diagnosis Date Noted  . Sepsis secondary to UTI ([place])   . Uncontrolled type 2 diabetes mellitus with complication ([place])   . Acute renal failure ([place])   . Chronic diastolic CHF (congestive heart failure) ([place])   . Acute lower UTI 01/09/2017  . Sepsis ([place]) 01/09/2017  . Constipation 01/09/2017  . COPD (chronic obstructive pulmonary disease) ([place]) 01/09/2017  . Multiple falls 06/14/2015  . Weight loss 06/14/2015  . Acute respiratory failure with hypoxia ([place]) 06/14/2015  . Orthostasis 06/14/2015  . Acute respiratory failure ([place]) 06/14/2015  . Elevated lactic acid level  01/22/2015  . 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 Chronic diastolic heart failure, NYHA class 1 ([place]) 01/22/2015  . Tachycardia with 100 - 120 beats per minute 04/08/2014  . DM (diabetes mellitus) ([place]) 04/08/2014  . Chest pain 04/07/2014  . Hyperglycemia 12/11/2012  . Syncope and collapse 12/11/2012  . Dehydration, moderate 12/11/2012  . Noncompliance 12/11/2012  . UTI (lower urinary tract infection) 12/11/2012  . Chronic steroid use 12/11/2012  . Rheumatoid arthritis ([place]) 12/11/2012    Past Surgical History:  Procedure Laterality Date  . APPENDECTOMY  1969  . BACK SURGERY    . [place]  . FRACTURE SURGERY    . LAPAROSCOPIC CHOLECYSTECTOMY  1990's  . LUMBAR DISC SURGERY     "slipped disc" (12/11/2012)  . ORIF SHOULDER FRACTURE Left ~ 2009   "put cement down in it"   . TONSILLECTOMY  1950's?  Marland Kitchen VAGINAL HYSTERECTOMY  ~ 1973    OB History    No data available       Home Medications    Prior to Admission medications   Medication Sig Start Date End Date Taking? Authorizing Provider  aspirin EC 81 MG tablet Take 1 tablet (81 mg total) by mouth daily. 04/09/14  Yes Debbe Odea, MD  atorvastatin (LIPITOR) 40 MG tablet Take 40 mg by mouth at bedtime.  12/19/16  Yes [provider]  hydroxychloroquine (PLAQUENIL) 200 MG tablet Take 200 mg by mouth 2 (two) times daily. 05/15/15  Yes [provider]  JANUVIA 100 MG tablet Take 100 mg by mouth daily.  01/11/15  Yes [provider]  metFORMIN (GLUCOPHAGE) 500 MG tablet Take 500 mg by mouth 2 (two) times daily. 02/13/17  Yes [provider]  methylPREDNISolone (MEDROL) 4 MG tablet Take 4 mg by mouth every morning.  05/21/15  Yes [provider]  omeprazole (PRILOSEC) 20 MG capsule Take 20 mg by mouth 2 (two) times daily. 12/17/16  Yes [provider]  traMADol (ULTRAM) 50 MG tablet Take 50 mg by mouth 2 (two) times daily as needed for pain.  05/04/12  Yes  Barbara Cower, MD  insulin glargine (LANTUS) 100 unit/mL SOPN Inject 0.06 mLs (6 Units total) into the skin at bedtime. Patient not taking: Reported on 03/02/2017 01/11/17   Allie Bossier, MD  metoCLOPramide (REGLAN) 5 MG tablet Take 1 tablet (5 mg total) by mouth 4 (four) times daily -  before meals and at bedtime. 05/05/17   Tanna Furry, MD    Family History Family History  Problem Relation Age of Onset  . Hypertension Mother   . Heart attack Father   . Diabetes Mellitus II Sister   . Hypertension Sister   . Cancer - Other Sister     Social History Social History  Substance Use Topics  . Smoking status: Never Smoker  . Smokeless tobacco: Never Used     Comment: 04/07/2014"1 pack of cigarettes would last 3-4 months; haven't had any cigarettes for 40 years"  . Alcohol use Yes     Comment: "used to drink beer in the 1990's every once in awhile"     Allergies   Patient has no known allergies.   Review of Systems Review of Systems  Constitutional: Positive for unexpected weight change. Negative for appetite change, chills, diaphoresis, fatigue and fever.  HENT: Negative for mouth sores, sore throat and trouble swallowing.   Eyes: Negative for visual disturbance.  Respiratory: Negative for cough, chest tightness, shortness of breath and wheezing.   Cardiovascular: Negative for chest pain.  Gastrointestinal: Positive for abdominal pain, nausea and vomiting. Negative for abdominal distention and diarrhea.  Endocrine: Negative for polydipsia, polyphagia and polyuria.  Genitourinary: Negative for dysuria, frequency and hematuria.  Musculoskeletal: Negative for gait problem.  Skin: Negative for color change, pallor and rash.  Neurological: Negative for dizziness, syncope, light-headedness and headaches.  Hematological: Does not bruise/bleed easily.  Psychiatric/Behavioral: Negative for behavioral problems and confusion.     Physical Exam Updated Vital Signs BP 112/77 (BP  Location: Right Arm)   Pulse (!) 116   Temp (!) 97.5 F (36.4 C) (Oral)   Resp 16   SpO2 100%   Physical Exam  Constitutional: She is oriented to person, place, and time. No distress.  81 year old female. Laying on her right side. Holding still in bed. No pain with movement. Thin.  HENT:  Head: Normocephalic.  Eyes: Pupils are equal, round, and reactive to light. Conjunctivae are normal. No scleral icterus.  Neck: Normal range of motion. Neck supple. No thyromegaly present.  Cardiovascular: Normal rate and regular rhythm.  Exam reveals no gallop and no friction rub.   No murmur heard. Pulmonary/Chest: Effort normal and breath sounds normal. No respiratory distress. She has no wheezes. She has no rales.  Abdominal: Soft. Bowel sounds are normal. She exhibits no distension. There is  no tenderness. There is no rebound.  Soft. No tenderness or peritoneal irritation. No palpable mass or pulsation.  Musculoskeletal: Normal range of motion.  Neurological: She is alert and oriented to person, place, and time.  Skin: Skin is warm and dry. No rash noted.  Psychiatric: She has a normal mood and affect. Her behavior is normal.     ED Treatments / Results  Labs (all labs ordered are listed, but only abnormal results are displayed) Labs Reviewed  COMPREHENSIVE METABOLIC PANEL - Abnormal; Notable for the following:       Result Value   Chloride 95 (*)    Calcium 8.8 (*)    AST 43 (*)    GFR calc non Af Amer 58 (*)    All other components within normal limits  URINALYSIS, ROUTINE W REFLEX MICROSCOPIC - Abnormal; Notable for the following:    APPearance HAZY (*)    Specific Gravity, Urine >1.046 (*)    Glucose, UA 50 (*)    Ketones, ur 20 (*)    Leukocytes, UA LARGE (*)    Bacteria, UA MANY (*)    Squamous Epithelial / LPF 0-5 (*)    All other components within normal limits  LIPASE, BLOOD  CBC    EKG  EKG Interpretation None       Radiology Ct Abdomen Pelvis W  Contrast  Result Date: 05/05/2017 CLINICAL DATA:  81 year old hypertensive female with vomiting for 2 weeks. Prior cholecystectomy, appendectomy and hysterectomy. Initial encounter. EXAM: CT ABDOMEN AND PELVIS WITH CONTRAST TECHNIQUE: Multidetector CT imaging of the abdomen and pelvis was performed using the standard protocol following bolus administration of intravenous contrast. CONTRAST:  176mL ISOVUE-300 IOPAMIDOL (ISOVUE-300) INJECTION 61% COMPARISON:  12/29/2016, 10/03/2014 and 05/24/2006 CT. FINDINGS: Lower chest: Scarring lung bases.  Heart size within normal limits. Hepatobiliary: Mild fatty infiltration of liver without worrisome hepatic lesion. Intrahepatic and extrahepatic biliary duct dilation without significant change. Post cholecystectomy. Pancreas: Dilated pancreatic duct unchanged without mass or inflammation noted. Spleen: No mass or enlargement. Adrenals/Urinary Tract: No hydronephrosis or obstructing stone. No worrisome adrenal or renal mass. Noncontrast filled views the urinary bladder unremarkable. Stomach/Bowel: Calcified structure gastric fundus region unchanged. Under distended portions of stomach and colon without extraluminal bowel inflammatory process, free fluid or free air. Gas and fluid-filled see come may be an incidental finding. Vascular/Lymphatic: Atherosclerotic changes aorta our with slight ectasia. No large vessel occlusion. No adenopathy. Reproductive: Post hysterectomy.  No worrisome adnexal mass. Other: No bowel containing hernia. Musculoskeletal: Post cement augmentation T11-T12. Remote Schmorl's node deformity L4 superior and inferior endplate. Prominent degenerative changes L1-2 through L5-S1. No new compression fracture noted. IMPRESSION: No acute abnormality noted. Post cholecystectomy with chronically dilated common bile duct and pancreatic duct. No calcified common bile duct stone or pancreatic mass noted. Post appendectomy. Portions of bowel are under distended  limiting evaluation. Fluid and gas-filled cecum may be an incidental finding as there are no other findings of colitis. Similar appearance of calcified structure gastric fundus region. Prior cement augmentation T11 and T12. Degenerative changes throughout the lumbar spine similar to prior exam. Aortic Atherosclerosis (ICD10-I70.0). Electronically Signed   By: Genia Del M.D.   On: 05/05/2017 12:20   Dg Chest Port 1 View  Result Date: 05/05/2017 CLINICAL DATA:  Dysphagia.  Chest tightness EXAM: PORTABLE CHEST 1 VIEW COMPARISON:  04/03/2017 chest x-ray FINDINGS: Normal heart size and mediastinal contours. Stable calcified densities over the right mid lung. There is no edema, consolidation, effusion, or pneumothorax. Partly  seen changes of vertebroplasty. No acute osseous finding. Cholecystectomy clips. IMPRESSION: Stable low volume chest.  No evidence of active disease. Electronically Signed   By: Monte Fantasia M.D.   On: 05/05/2017 11:14    Procedures Procedures (including critical care time)  Medications Ordered in ED Medications  ondansetron (ZOFRAN) injection 4 mg (not administered)  morphine 4 MG/ML injection 4 mg (4 mg Intravenous Given 05/05/17 1048)  0.9 %  sodium chloride infusion ( Intravenous Stopped 05/05/17 1224)  ondansetron (ZOFRAN) injection 4 mg (4 mg Intravenous Given 05/05/17 1048)  iopamidol (ISOVUE-300) 61 % injection (  Contrast Given 05/05/17 1053)  iopamidol (ISOVUE-300) 61 % injection 100 mL (100 mLs Intravenous Contrast Given 05/05/17 1138)     Initial Impression / Assessment and Plan / ED Course  I have reviewed the triage vital signs and the nursing notes.  Pertinent labs & imaging results that were available during my care of the patient were reviewed by me and considered in my medical decision making (see chart for details).   81 year old female with abdominal pain, vomiting, dysphagia, and weight loss. Plan CT imaging of the abdomen. Chest x-ray. Labs,  symptomatically treatment. Reevaluation.  Final Clinical Impressions(s) / ED Diagnoses   Final diagnoses:  Nausea    Patient does not have signs of thrush to suggest esophagitis. She may be having simple gastroparesis secondary to her diabetes periods plan will be symptomatically treatment with scheduled Reglan. GI follow-up. May be endoscopy to rule out candidiasis, H. Pylori, etc. She is on twice daily PPI.   Patient is asymptomatic with to any GU complaints. No dysuria no flank pain no fever. She has 2 numerous to count white blood cells and positive esterase. Negative nitrites. Some squamous cells. With her symptoms being GI gastric, I have elected not not to treat her empirically. We'll await culture.  New Prescriptions New Prescriptions   METOCLOPRAMIDE (REGLAN) 5 MG TABLET    Take 1 tablet (5 mg total) by mouth 4 (four) times daily -  before meals and at bedtime.     Tanna Furry, MD 05/05/17 [phone]

## 2017-05-07 ENCOUNTER — Emergency Department (HOSPITAL_COMMUNITY)
Admission: EM | Admit: 2017-05-07 | Discharge: 2017-05-08 | Disposition: A | Discharge status: Home / Self Care | Payer: Medicare Other | Attending: Emergency Medicine | Admitting: Emergency Medicine

## 2017-05-07 ENCOUNTER — Encounter (HOSPITAL_COMMUNITY): Payer: Self-pay | Admitting: Emergency Medicine

## 2017-05-07 DIAGNOSIS — Z5321 Procedure and treatment not carried out due to patient leaving prior to being seen by health care provider: Secondary | ICD-10-CM | POA: Insufficient documentation

## 2017-05-07 DIAGNOSIS — R109 Unspecified abdominal pain: Secondary | ICD-10-CM | POA: Diagnosis present

## 2017-05-07 MED ORDER — ONDANSETRON 4 MG PO TBDP
4.0000 mg | ORAL_TABLET | Freq: Once | ORAL | Status: AC | PRN
  Administered 2017-05-07: 4 mg via ORAL
  Filled 2017-05-07: qty 1

## 2017-05-07 NOTE — ED Notes (Signed)
Called pt, no response. 

## 2017-05-07 NOTE — ED Notes (Signed)
Pt was called again and no answer

## 2017-05-07 NOTE — ED Notes (Signed)
Two attempts made on pt for blood draw, blood would not flow either time to obtain blood for labs.

## 2017-05-07 NOTE — ED Triage Notes (Signed)
Pt with [facility] epigastric pain, recently here for the same. EKG normal and vitals are stable. Hx of gallstones.

## 2017-05-08 LAB — URINE CULTURE: Culture: >=100000 — AB

## 2017-05-09 ENCOUNTER — Telehealth: Payer: Self-pay

## 2017-05-09 NOTE — Telephone Encounter (Signed)
No treatment for UC ED 05/05/17 Per Virgina Jock PA-C

## 2017-05-15 ENCOUNTER — Inpatient Hospital Stay (HOSPITAL_COMMUNITY)
Admission: EM | Admit: 2017-05-15 | Discharge: 2017-05-19 | DRG: 392 | Disposition: A | Discharge status: Home / Self Care | Payer: Medicare Other | Attending: Internal Medicine | Admitting: Internal Medicine

## 2017-05-15 ENCOUNTER — Emergency Department (HOSPITAL_COMMUNITY): Payer: Medicare Other

## 2017-05-15 ENCOUNTER — Inpatient Hospital Stay (HOSPITAL_COMMUNITY): Payer: Medicare Other

## 2017-05-15 DIAGNOSIS — M1990 Unspecified osteoarthritis, unspecified site: Secondary | ICD-10-CM | POA: Diagnosis present

## 2017-05-15 DIAGNOSIS — Z9889 Other specified postprocedural states: Secondary | ICD-10-CM | POA: Diagnosis not present

## 2017-05-15 DIAGNOSIS — Z23 Encounter for immunization: Secondary | ICD-10-CM

## 2017-05-15 DIAGNOSIS — E876 Hypokalemia: Secondary | ICD-10-CM

## 2017-05-15 DIAGNOSIS — E1122 Type 2 diabetes mellitus with diabetic chronic kidney disease: Secondary | ICD-10-CM

## 2017-05-15 DIAGNOSIS — G4733 Obstructive sleep apnea (adult) (pediatric): Secondary | ICD-10-CM | POA: Diagnosis present

## 2017-05-15 DIAGNOSIS — Z87440 Personal history of urinary (tract) infections: Secondary | ICD-10-CM

## 2017-05-15 DIAGNOSIS — Z90710 Acquired absence of both cervix and uterus: Secondary | ICD-10-CM | POA: Diagnosis not present

## 2017-05-15 DIAGNOSIS — E119 Type 2 diabetes mellitus without complications: Secondary | ICD-10-CM

## 2017-05-15 DIAGNOSIS — R197 Diarrhea, unspecified: Secondary | ICD-10-CM | POA: Diagnosis present

## 2017-05-15 DIAGNOSIS — R1013 Epigastric pain: Secondary | ICD-10-CM | POA: Diagnosis not present

## 2017-05-15 DIAGNOSIS — Z8249 Family history of ischemic heart disease and other diseases of the circulatory system: Secondary | ICD-10-CM

## 2017-05-15 DIAGNOSIS — Z7984 Long term (current) use of oral hypoglycemic drugs: Secondary | ICD-10-CM

## 2017-05-15 DIAGNOSIS — M069 Rheumatoid arthritis, unspecified: Secondary | ICD-10-CM | POA: Diagnosis present

## 2017-05-15 DIAGNOSIS — K219 Gastro-esophageal reflux disease without esophagitis: Secondary | ICD-10-CM | POA: Diagnosis present

## 2017-05-15 DIAGNOSIS — Z833 Family history of diabetes mellitus: Secondary | ICD-10-CM

## 2017-05-15 DIAGNOSIS — B9620 Unspecified Escherichia coli [E. coli] as the cause of diseases classified elsewhere: Secondary | ICD-10-CM | POA: Diagnosis present

## 2017-05-15 DIAGNOSIS — J449 Chronic obstructive pulmonary disease, unspecified: Secondary | ICD-10-CM | POA: Diagnosis present

## 2017-05-15 DIAGNOSIS — Z79899 Other long term (current) drug therapy: Secondary | ICD-10-CM | POA: Diagnosis not present

## 2017-05-15 DIAGNOSIS — I509 Heart failure, unspecified: Secondary | ICD-10-CM | POA: Diagnosis present

## 2017-05-15 DIAGNOSIS — R000 Tachycardia, unspecified: Secondary | ICD-10-CM | POA: Diagnosis present

## 2017-05-15 DIAGNOSIS — R112 Nausea with vomiting, unspecified: Secondary | ICD-10-CM | POA: Diagnosis present

## 2017-05-15 DIAGNOSIS — N3000 Acute cystitis without hematuria: Secondary | ICD-10-CM

## 2017-05-15 DIAGNOSIS — N182 Chronic kidney disease, stage 2 (mild): Secondary | ICD-10-CM

## 2017-05-15 DIAGNOSIS — N390 Urinary tract infection, site not specified: Secondary | ICD-10-CM | POA: Diagnosis present

## 2017-05-15 DIAGNOSIS — Z9049 Acquired absence of other specified parts of digestive tract: Secondary | ICD-10-CM

## 2017-05-15 DIAGNOSIS — I110 Hypertensive heart disease with heart failure: Secondary | ICD-10-CM | POA: Diagnosis present

## 2017-05-15 DIAGNOSIS — Z794 Long term (current) use of insulin: Secondary | ICD-10-CM

## 2017-05-15 DIAGNOSIS — E785 Hyperlipidemia, unspecified: Secondary | ICD-10-CM | POA: Diagnosis present

## 2017-05-15 HISTORY — DX: Cardiac murmur, unspecified: R01.1

## 2017-05-15 HISTORY — DX: Low back pain: M54.5

## 2017-05-15 HISTORY — DX: Low back pain, unspecified: M54.50

## 2017-05-15 HISTORY — DX: Migraine, unspecified, not intractable, without status migrainosus: G43.909

## 2017-05-15 HISTORY — DX: Other chronic pain: G89.29

## 2017-05-15 LAB — COMPREHENSIVE METABOLIC PANEL
ALK PHOS: 51 U/L (ref 38–126)
ALT: 19 U/L (ref 14–54)
AST: 42 U/L — AB (ref 15–41)
Albumin: 3.2 g/dL — ABNORMAL LOW (ref 3.5–5.0)
Anion gap: 14 (ref 5–15)
BUN: 12 mg/dL (ref 6–20)
CHLORIDE: 95 mmol/L — AB (ref 101–111)
CO2: 27 mmol/L (ref 22–32)
Calcium: 8.8 mg/dL — ABNORMAL LOW (ref 8.9–10.3)
Creatinine, Ser: 0.77 mg/dL (ref 0.44–1.00)
GFR calc Af Amer: >60 mL/min (ref >60)
GFR calc non Af Amer: >60 mL/min (ref >60)
GLUCOSE: 97 mg/dL (ref 65–99)
Potassium: 2.9 mmol/L — ABNORMAL LOW (ref 3.5–5.1)
SODIUM: 136 mmol/L (ref 135–145)
TOTAL PROTEIN: 6.3 g/dL — AB (ref 6.5–8.1)
Total Bilirubin: 1.1 mg/dL (ref 0.3–1.2)

## 2017-05-15 LAB — CORTISOL: CORTISOL PLASMA: 7.3 ug/dL

## 2017-05-15 LAB — URINALYSIS, ROUTINE W REFLEX MICROSCOPIC
BILIRUBIN URINE: NEGATIVE
GLUCOSE, UA: NEGATIVE mg/dL
Hgb urine dipstick: NEGATIVE
Ketones, ur: 80 mg/dL — AB
LEUKOCYTES UA: NEGATIVE
Nitrite: NEGATIVE
PH: 6 (ref 5.0–8.0)
Protein, ur: 30 mg/dL — AB
SPECIFIC GRAVITY, URINE: 1.02 (ref 1.005–1.030)
SQUAMOUS EPITHELIAL / LPF: NONE SEEN

## 2017-05-15 LAB — CBC
HCT: 42.5 % (ref 36.0–46.0)
HEMOGLOBIN: 14.1 g/dL (ref 12.0–15.0)
MCH: 28.1 pg (ref 26.0–34.0)
MCHC: 33.2 g/dL (ref 30.0–36.0)
MCV: 84.8 fL (ref 78.0–100.0)
Platelets: 355 10*3/uL (ref 150–400)
RBC: 5.01 MIL/uL (ref 3.87–5.11)
RDW: 14.7 % (ref 11.5–15.5)
WBC: 12.9 10*3/uL — ABNORMAL HIGH (ref 4.0–10.5)

## 2017-05-15 LAB — I-STAT CG4 LACTIC ACID, ED
LACTIC ACID, VENOUS: 2.47 mmol/L — AB (ref 0.5–1.9)
Lactic Acid, Venous: 2.42 mmol/L (ref 0.5–1.9)

## 2017-05-15 LAB — CBG MONITORING, ED: Glucose-Capillary: 76 mg/dL (ref 65–99)

## 2017-05-15 LAB — I-STAT TROPONIN, ED: Troponin i, poc: 0.01 ng/mL (ref 0.00–0.08)

## 2017-05-15 LAB — BRAIN NATRIURETIC PEPTIDE: B Natriuretic Peptide: 32.7 pg/mL (ref 0.0–100.0)

## 2017-05-15 MED ORDER — SODIUM CHLORIDE 0.9 % IV SOLN
INTRAVENOUS | Status: DC
  Administered 2017-05-15 – 2017-05-17 (×3): via INTRAVENOUS

## 2017-05-15 MED ORDER — MORPHINE SULFATE (PF) 4 MG/ML IV SOLN
2.0000 mg | Freq: Once | INTRAVENOUS | Status: AC
  Administered 2017-05-15: 2 mg via INTRAVENOUS
  Filled 2017-05-15: qty 1

## 2017-05-15 MED ORDER — DOCUSATE SODIUM 100 MG PO CAPS: 100.0000 mg | ORAL_CAPSULE | Freq: Two times a day (BID) | ORAL | Status: DC | PRN

## 2017-05-15 MED ORDER — GI COCKTAIL ~~LOC~~
30.0000 mL | Freq: Once | ORAL | Status: AC
  Administered 2017-05-15: 30 mL via ORAL
  Filled 2017-05-15: qty 30

## 2017-05-15 MED ORDER — IBUPROFEN 400 MG PO TABS: 800.0000 mg | ORAL_TABLET | Freq: Two times a day (BID) | ORAL | Status: DC | PRN

## 2017-05-15 MED ORDER — PANTOPRAZOLE SODIUM 40 MG PO TBEC
40.0000 mg | DELAYED_RELEASE_TABLET | Freq: Two times a day (BID) | ORAL | Status: DC
  Administered 2017-05-15 – 2017-05-19 (×8): 40 mg via ORAL
  Filled 2017-05-15 (×8): qty 1

## 2017-05-15 MED ORDER — ENOXAPARIN SODIUM 40 MG/0.4ML ~~LOC~~ SOLN
40.0000 mg | SUBCUTANEOUS | Status: DC
  Administered 2017-05-16 – 2017-05-18 (×3): 40 mg via SUBCUTANEOUS
  Filled 2017-05-15 (×3): qty 0.4

## 2017-05-15 MED ORDER — HYDROXYZINE HCL 25 MG PO TABS: 25.0000 mg | ORAL_TABLET | Freq: Four times a day (QID) | ORAL | Status: DC | PRN

## 2017-05-15 MED ORDER — ACETAMINOPHEN 650 MG RE SUPP: 650.0000 mg | Freq: Four times a day (QID) | RECTAL | Status: DC | PRN

## 2017-05-15 MED ORDER — ONDANSETRON HCL 4 MG PO TABS: 4.0000 mg | ORAL_TABLET | Freq: Four times a day (QID) | ORAL | Status: DC | PRN

## 2017-05-15 MED ORDER — ACETAMINOPHEN 325 MG PO TABS: 650.0000 mg | ORAL_TABLET | Freq: Four times a day (QID) | ORAL | Status: DC | PRN

## 2017-05-15 MED ORDER — ASPIRIN EC 81 MG PO TBEC
81.0000 mg | DELAYED_RELEASE_TABLET | Freq: Every day | ORAL | Status: DC
  Administered 2017-05-15 – 2017-05-19 (×5): 81 mg via ORAL
  Filled 2017-05-15 (×5): qty 1

## 2017-05-15 MED ORDER — HYDROXYCHLOROQUINE SULFATE 200 MG PO TABS
200.0000 mg | ORAL_TABLET | Freq: Two times a day (BID) | ORAL | Status: DC
  Administered 2017-05-16 – 2017-05-19 (×7): 200 mg via ORAL
  Filled 2017-05-15 (×8): qty 1

## 2017-05-15 MED ORDER — INSULIN ASPART 100 UNIT/ML ~~LOC~~ SOLN
0.0000 [IU] | SUBCUTANEOUS | Status: DC
  Administered 2017-05-16 – 2017-05-18 (×4): 1 [IU] via SUBCUTANEOUS

## 2017-05-15 MED ORDER — TRAMADOL HCL 50 MG PO TABS
50.0000 mg | ORAL_TABLET | Freq: Two times a day (BID) | ORAL | Status: DC | PRN
  Administered 2017-05-16 – 2017-05-19 (×4): 50 mg via ORAL
  Filled 2017-05-15 (×4): qty 1

## 2017-05-15 MED ORDER — POTASSIUM CHLORIDE CRYS ER 20 MEQ PO TBCR
40.0000 meq | EXTENDED_RELEASE_TABLET | Freq: Once | ORAL | Status: AC
  Administered 2017-05-15: 40 meq via ORAL
  Filled 2017-05-15: qty 2

## 2017-05-15 MED ORDER — ONDANSETRON HCL 4 MG/2ML IJ SOLN: 4.0000 mg | Freq: Four times a day (QID) | INTRAMUSCULAR | Status: DC | PRN

## 2017-05-15 MED ORDER — METOCLOPRAMIDE HCL 5 MG/ML IJ SOLN
5.0000 mg | Freq: Four times a day (QID) | INTRAMUSCULAR | Status: DC
  Administered 2017-05-15 – 2017-05-17 (×5): 5 mg via INTRAVENOUS
  Filled 2017-05-15 (×5): qty 2

## 2017-05-15 MED ORDER — DEXTROSE 5 % IV SOLN
1.0000 g | INTRAVENOUS | Status: DC
  Administered 2017-05-16 – 2017-05-18 (×3): 1 g via INTRAVENOUS
  Filled 2017-05-15 (×3): qty 10

## 2017-05-15 MED ORDER — ATORVASTATIN CALCIUM 40 MG PO TABS
40.0000 mg | ORAL_TABLET | Freq: Every day | ORAL | Status: DC
  Administered 2017-05-16 – 2017-05-18 (×3): 40 mg via ORAL
  Filled 2017-05-15 (×3): qty 1

## 2017-05-15 MED ORDER — CEFTRIAXONE SODIUM 1 G IJ SOLR
1.0000 g | Freq: Once | INTRAMUSCULAR | Status: AC
  Administered 2017-05-15: 1 g via INTRAVENOUS
  Filled 2017-05-15: qty 10

## 2017-05-15 MED ORDER — SODIUM CHLORIDE 0.9 % IV BOLUS (SEPSIS)
500.0000 mL | Freq: Once | INTRAVENOUS | Status: AC
  Administered 2017-05-15: 500 mL via INTRAVENOUS

## 2017-05-15 MED ORDER — KETOROLAC TROMETHAMINE 15 MG/ML IJ SOLN: 15.0000 mg | Freq: Once | INTRAMUSCULAR | Status: DC

## 2017-05-15 MED ORDER — LORAZEPAM 2 MG/ML IJ SOLN
0.5000 mg | Freq: Once | INTRAMUSCULAR | Status: AC
  Administered 2017-05-15: 0.5 mg via INTRAVENOUS
  Filled 2017-05-15: qty 1

## 2017-05-15 NOTE — ED Provider Notes (Signed)
Labs reviewed.   UA suggestive of UTI.  Pt does have an elevated lactic acid level.  Will consult with medical service for admission.  IV lost and pt will need another.   Dorie Rank, MD 05/15/17 2011

## 2017-05-15 NOTE — ED Provider Notes (Signed)
[facility] EMERGENCY DEPARTMENT Provider Note   CSN: [id number] Arrival date & time: 05/15/17  1414    History   Chief Complaint Chief Complaint  Patient presents with  . Abdominal Pain  . Chest Pain    HPI [name] is a 81 y.o. female with history of recent admission for urosepsis, recurrent UTIs (prior UCx with E. Coli and Klebsiella), CHF, uncontrolled T2DM (last A1c 10.1 12/2016), GERD, OSA, HTN, HLD, RA, and COPD who presents to the ED from home with epigastric abdominal pain. Patient states pain started ~1 month ago, it is nonradiating, and is associated with nausea, NBNB emesis, intermittent constipation and decreased PO intake. Can not recall when was her last BM. She was recently seen in the ED 10/8 with similar complain. CT abdomen/pelvis with no acute abnormalities at the time. She was managed with anti-emetics and d/c home. Ucx from ED visit showed E. Coli, but it appears like patient was not reporting urinary symptoms at that time. It also seems staff tried to contact her about Ucx results and were unable to do so. She is now reporting straining, urgency, and frequency. Denies pain or burning with urination. No episodes of incontinence. She also endorses subjective fevers at home. No chills. She also endorses SOB that is intermittent in nature. She does have a history of DOE recorded in chart. Does not report DOE has worsened recently. She was satting 100% on room air when seen.   HPI  Past Medical History:  Diagnosis Date  . Anemia    "when I was young"  . Anginal pain ([place])   . Arthritis    "all over my body"   . Chronic bronchitis ([place])    "get it just about q yr" (06/14/2015)  . Exertional shortness of breath   . Frequent UTI    "recently" (06/14/2015)  . GERD (gastroesophageal reflux disease)   . Headache(784.0)    "probably every other day " (06/14/2015)  . High cholesterol   . History of blood transfusion ~ 1954   "related to my  periods"  . Hypertension   . OSA (obstructive sleep apnea)    "I think I'm using a BiPAP" (06/14/2015)  . Type II diabetes mellitus [facility])     Patient Active Problem List   Diagnosis Date Noted  . Sepsis secondary to UTI ([place])   . Uncontrolled type 2 diabetes mellitus with complication ([place])   . Acute renal failure ([place])   . Chronic diastolic CHF (congestive heart failure) ([place])   . Acute lower UTI 01/09/2017  . Sepsis ([place]) 01/09/2017  . Constipation 01/09/2017  . COPD (chronic obstructive pulmonary disease) ([place]) 01/09/2017  . Multiple falls 06/14/2015  . Weight loss 06/14/2015  . Acute respiratory failure with hypoxia ([place]) 06/14/2015  . Orthostasis 06/14/2015  . Acute respiratory failure ([place]) 06/14/2015  . Elevated lactic acid level 01/22/2015  . 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 Chronic diastolic heart failure, NYHA class 1 ([place]) 01/22/2015  . Tachycardia with 100 - 120 beats per minute 04/08/2014  . DM (diabetes mellitus) ([place]) 04/08/2014  . Chest pain 04/07/2014  . Hyperglycemia 12/11/2012  . Syncope and collapse 12/11/2012  . Dehydration, moderate 12/11/2012  . Noncompliance 12/11/2012  . UTI (lower urinary tract infection) 12/11/2012  . Chronic steroid use 12/11/2012  . Rheumatoid arthritis ([place]) 12/11/2012    Past Surgical History:  Procedure Laterality Date  . APPENDECTOMY  1969  . BACK SURGERY    . ECTOPIC PREGNANCY SURGERY  [place]    . LAPAROSCOPIC CHOLECYSTECTOMY  1990's  . LUMBAR DISC SURGERY     "slipped disc" (12/11/2012)  . ORIF SHOULDER FRACTURE Left ~ 2009   "put cement down in it"   . TONSILLECTOMY  1950's?  Marland Kitchen VAGINAL HYSTERECTOMY  ~ 1973    OB History    No data available       Home Medications    Prior to Admission medications   Medication Sig Start Date End Date Taking? Authorizing Provider  aspirin EC 81 MG tablet Take 1 tablet (81 mg total) by mouth daily. 04/09/14   Debbe Odea, MD  atorvastatin (LIPITOR)  40 MG tablet Take 40 mg by mouth at bedtime. 12/19/16   [provider]  hydroxychloroquine (PLAQUENIL) 200 MG tablet Take 200 mg by mouth 2 (two) times daily. 05/15/15   [provider]  insulin glargine (LANTUS) 100 unit/mL SOPN Inject 0.06 mLs (6 Units total) into the skin at bedtime. Patient not taking: Reported on 03/02/2017 01/11/17   Allie Bossier, MD  JANUVIA 100 MG tablet Take 100 mg by mouth daily.  01/11/15   [provider]  metFORMIN (GLUCOPHAGE) 500 MG tablet Take 500 mg by mouth 2 (two) times daily. 02/13/17   [provider]  methylPREDNISolone (MEDROL) 4 MG tablet Take 4 mg by mouth every morning.  05/21/15   [provider]  metoCLOPramide (REGLAN) 5 MG tablet Take 1 tablet (5 mg total) by mouth 4 (four) times daily -  before meals and at bedtime. 05/05/17   Tanna Furry, MD  omeprazole (PRILOSEC) 20 MG capsule Take 20 mg by mouth 2 (two) times daily. 12/17/16   [provider]  traMADol (ULTRAM) 50 MG tablet Take 50 mg by mouth 2 (two) times daily as needed for pain.  05/04/12   Barbara Cower, MD    Family History Family History  Problem Relation Age of Onset  . Hypertension Mother   . Heart attack Father   . Diabetes Mellitus II Sister   . Hypertension Sister   . Cancer - Other Sister     Social History Social History  Substance Use Topics  . Smoking status: Never Smoker  . Smokeless tobacco: Never Used     Comment: 04/07/2014"1 pack of cigarettes would last 3-4 months; haven't had any cigarettes for 40 years"  . Alcohol use Yes     Comment: "used to drink beer in the 1990's every once in awhile"     Allergies   Patient has no known allergies.   Review of Systems Review of Systems  Constitutional: Positive for activity change, appetite change, fatigue, fever and unexpected weight change. Negative for chills and diaphoresis.  Respiratory: Negative for cough, chest tightness, shortness of breath, wheezing and  stridor.   Cardiovascular: Negative for chest pain, palpitations and leg swelling.  Gastrointestinal: Positive for abdominal pain, constipation, nausea and vomiting. Negative for abdominal distention and diarrhea.  Genitourinary: Positive for difficulty urinating, frequency and urgency. Negative for decreased urine volume, dysuria, flank pain and hematuria.  Musculoskeletal: Positive for arthralgias and back pain.  Neurological: Negative for weakness, light-headedness and headaches.  All other systems reviewed and are negative.    Physical Exam Updated Vital Signs BP (!) 135/111   Pulse (!) 115   Temp 97.7 F (36.5 C) (Oral)   Resp (!) 29   SpO2 100%   Physical Exam  Constitutional: She is oriented to person, place, and time.  Patient appears  chronically ill, thin, and uncomfortable secondary to pain. Her breathing is labored but no accessory muscle use.   Eyes: Pupils are equal, round, and reactive to light. Conjunctivae and EOM are normal.  Cardiovascular: Normal rate, regular rhythm, normal heart sounds and intact distal pulses.  Exam reveals no gallop and no friction rub.   No murmur heard. Pulmonary/Chest: Effort normal and breath sounds normal. No respiratory distress. She has no wheezes. She has no rales. She exhibits no tenderness.  Abdominal: Soft. Bowel sounds are normal. She exhibits no distension. There is no tenderness. There is no rebound and no guarding.  Musculoskeletal: She exhibits no edema.  Neurological: She is alert and oriented to person, place, and time.  Skin: Skin is warm. Capillary refill takes less than 2 seconds.     ED Treatments / Results  Labs (all labs ordered are listed, but only abnormal results are displayed) Labs Reviewed  I-STAT CG4 LACTIC ACID, ED - Abnormal; Notable for the following:       Result Value   Lactic Acid, Venous 2.47 (*)    All other components within normal limits  CULTURE, BLOOD (ROUTINE X 2)  CULTURE, BLOOD (ROUTINE X 2)   URINE CULTURE  CBC  BRAIN NATRIURETIC PEPTIDE  URINALYSIS, ROUTINE W REFLEX MICROSCOPIC  COMPREHENSIVE METABOLIC PANEL  I-STAT TROPONIN, ED    EKG  EKG Interpretation  Date/Time:  Thursday May 15 2017 14:57:06 EDT Ventricular Rate:  110 PR Interval:    QRS Duration: 82 QT Interval:  388 QTC Calculation: 525 R Axis:   -68 Text Interpretation:  Sinus tachycardia Left anterior fascicular block Abnormal R-wave progression, early transition Nonspecific repol abnormality, diffuse leads Prolonged QT interval Baseline wander in lead(s) I III aVR aVL Since last tracing rate faster Confirmed by Dorie Rank [phone]) on 05/15/2017 3:04:15 PM Also confirmed by Dorie Rank [phone]), editor Philomena Doheny [phone])  on 05/15/2017 3:41:37 PM       Radiology Dg Chest 2 View  Result Date: 05/15/2017 CLINICAL DATA:  Chest pain for several days EXAM: CHEST  2 VIEW COMPARISON:  05/05/2017 FINDINGS: Cardiac shadow is stable. The lungs are clear bilaterally. Degenerative changes about shoulder joints are noted. Changes of prior vertebral augmentation are again seen and stable. No new focal abnormality is noted. IMPRESSION: No active cardiopulmonary disease. Electronically Signed   By: Inez Catalina M.D.   On: 05/15/2017 15:59    Procedures Procedures (including critical care time)  Medications Ordered in ED Medications  cefTRIAXone (ROCEPHIN) 1 g in dextrose 5 % 50 mL IVPB (not administered)  gi cocktail (Maalox,Lidocaine,Donnatal) (not administered)  LORazepam (ATIVAN) injection 0.5 mg (not administered)  sodium chloride 0.9 % bolus 500 mL (not administered)  morphine 4 MG/ML injection 2 mg (not administered)     Initial Impression / Assessment and Plan / ED Course  I have reviewed the triage vital signs and the nursing notes.  Pertinent labs & imaging results that were available during my care of the patient were reviewed by me and considered in my medical decision making (see chart for  details).    [name] is a 81 y.o. female with history of recent admission for urosepsis, recurrent UTIs (prior UCx with E. Coli and Klebsiella), CHF, uncontrolled T2DM, GERD, OSA, HTN, HLD, RA, and COPD who presents to the ED from home with multiple medical complains including epigastric abdominal pain associated with N/V, intermittent constipation and  decreased PO intake, as well as SOB and urinary urgency, frequency and straining.  Recently seen in the ED 10/8 with epigastric pain with no acute abnormalities seen on CT. She is tachycardic with HR 120s and hypotensive on arrival. Appears to be in mild respiratory distress but is satting 100% while on room air and normal lung exam. No signs of volume overload on exam.  EKG with ST and prolonged QTc 525 ms, but without signs of acute ischemia. DDx includes UTI, sepsis,  ACS,  CHF exacerbation, gastroparesis, PE, PNA, gastritis, ?SBO (low suspicion). Will f/u with CBC, CMP, lactic acid, BNP, UA, Ucx, CXR and blood cx. CTX given for E coli UTI.   1510 GI cocktail and IV Ativan 0.5 mg x1 given for ongoing epigastric pain and nausea (prolonged QTc). 530mL NS bolus given (hx of CHF).   [place] patient. Continues to complain of pain. IV team consulted due to inability to obtain IV access.   1445 IV access obtained. Labs collected.   Case discussed with Dr. Vanita Panda. Patient signed out to Dr. Tomi Bamberger.    Final Clinical Impressions(s) / ED Diagnoses   Final diagnoses:  Epigastric pain  Acute cystitis without hematuria    New Prescriptions New Prescriptions   No medications on file     Welford Roche, MD 05/15/17 1659    Carmin Muskrat, MD 05/17/17 2127

## 2017-05-15 NOTE — ED Notes (Signed)
IV access lost during abx infusion. EDP notified. New IV team consult placed.

## 2017-05-15 NOTE — ED Notes (Addendum)
IV attempt without success. IV team order placed. Phlebotomy notified in order to expedite blood draw.

## 2017-05-15 NOTE — ED Triage Notes (Addendum)
Pt arrived via [facility] from home with a c/c of upper abd pain/chest pain x4 days. Per ems, pt described pain as a "sharp pressure". Pt has not been eating well and has been nauseas, but denies vomiting. Pt is lethargic but responsive; alert and oriented x4. Normal temp noted upon arrival. EMS HR 110-130, Sp02 100% RA, BP 121/91. CBG 159.

## 2017-05-15 NOTE — ED Notes (Signed)
Unable to obtain second set of cultures prior to abx.

## 2017-05-15 NOTE — H&P (Signed)
History and Physical    [name] JKD:[id number] DOB: 1936-07-10 DOA: 05/15/2017  PCP: Nolene Ebbs, MD  Patient coming from: Home  I have personally briefly reviewed patient's old medical records in [place]  Chief Complaint: Abd pain, N/V  HPI: [name] is a 81 y.o. female with medical history significant of RA, frequent UTIs, uncontrolled DM2 with A1C of 10.1.  Patient has been having ~1 month history of epigastric abd pain, NBNB emesis, decreased PO intake.  Thinks last BM was about a week ago.  CT abd / pelvis done 10 days ago showed no acute abnormalities.  Was started on PO reglan.  She has also had 3 day history of urinary urgency / frequency (although she didn't have this 10 days ago).  UA was positive 10 days ago for E.Coli UTI, she confirms she was started on cipro after cultures came back (I guess someone was able to get a-hold of her), looks like this was started around 05/08/17 and has been taking that (although I wonder how much shes kept down with the N/V).  ED Course: UA shows many bacteria, no luk esterase nor WBCs this time.  Given dose of rocephin in ED.  Hospitalist asked to admit.   Review of Systems: As per HPI otherwise 10 point review of systems negative.   Past Medical History:  Diagnosis Date  . Anemia    "when I was young"  . Anginal pain ([place])   . Arthritis    "all over my body"   . Chronic bronchitis ([place])    "get it just about q yr" (06/14/2015)  . Exertional shortness of breath   . Frequent UTI    "recently" (06/14/2015)  . GERD (gastroesophageal reflux disease)   . Headache(784.0)    "probably every other day " (06/14/2015)  . High cholesterol   . History of blood transfusion ~ 1954   "related to my periods"  . Hypertension   . OSA (obstructive sleep apnea)    "I think I'm using a BiPAP" (06/14/2015)  . Type II diabetes mellitus ([place])     Past Surgical History:  Procedure Laterality Date  . APPENDECTOMY  1969    . BACK SURGERY    . [place]  . FRACTURE SURGERY    . LAPAROSCOPIC CHOLECYSTECTOMY  1990's  . LUMBAR DISC SURGERY     "slipped disc" (12/11/2012)  . ORIF SHOULDER FRACTURE Left ~ 2009   "put cement down in it"   . TONSILLECTOMY  1950's?  Marland Kitchen VAGINAL HYSTERECTOMY  ~ 1973     reports that she has never smoked. She has never used smokeless tobacco. She reports that she drinks alcohol. She reports that she does not use drugs.  No Known Allergies  Family History  Problem Relation Age of Onset  . Hypertension Mother   . Heart attack Father   . Diabetes Mellitus II Sister   . Hypertension Sister   . Cancer - Other Sister      Prior to Admission medications   Medication Sig Start Date End Date Taking? Authorizing Provider  docusate sodium (COLACE) 100 MG capsule Take 100 mg by mouth 2 (two) times daily as needed for mild constipation.   Yes [provider]  hydrOXYzine (ATARAX/VISTARIL) 25 MG tablet Take 25 mg by mouth every 6 (six) hours as needed for itching.   Yes [provider]  ibuprofen (ADVIL,MOTRIN) 800 MG tablet Take 800 mg by mouth 2 (two) times daily  as needed (for pain).   Yes [provider]  insulin glargine (LANTUS) 100 unit/mL SOPN Inject 0.06 mLs (6 Units total) into the skin at bedtime. 01/11/17  Yes Allie Bossier, MD  aspirin EC 81 MG tablet Take 1 tablet (81 mg total) by mouth daily. 04/09/14   Debbe Odea, MD  atorvastatin (LIPITOR) 40 MG tablet Take 40 mg by mouth at bedtime. 12/19/16   [provider]  hydroxychloroquine (PLAQUENIL) 200 MG tablet Take 200 mg by mouth 2 (two) times daily. 05/15/15   [provider]  JANUVIA 100 MG tablet Take 100 mg by mouth daily.  01/11/15   [provider]  metFORMIN (GLUCOPHAGE) 500 MG tablet Take 500 mg by mouth 2 (two) times daily. 02/13/17   [provider]  metoCLOPramide (REGLAN) 5 MG tablet Take 1 tablet (5 mg total) by mouth 4 (four)  times daily -  before meals and at bedtime. 05/05/17   Tanna Furry, MD  omeprazole (PRILOSEC) 20 MG capsule Take 20 mg by mouth 2 (two) times daily. 12/17/16   [provider]  traMADol (ULTRAM) 50 MG tablet Take 50 mg by mouth 2 (two) times daily as needed for pain.  05/04/12   Barbara Cower, MD    Physical Exam: Vitals:   05/15/17 1915 05/15/17 1930 05/15/17 1945 05/15/17 2000  BP:  140/78  134/74  Pulse: (!) 113 (!) 103 (!) 103 98  Resp: 18 (!) 23 20 (!) 22  Temp:      TempSrc:      SpO2: 94% 96% 100% 98%    Constitutional: NAD, calm, comfortable Eyes: PERRL, lids and conjunctivae normal ENMT: Mucous membranes are moist. Posterior pharynx clear of any exudate or lesions.Normal dentition.  Neck: normal, supple, no masses, no thyromegaly Respiratory: clear to auscultation bilaterally, no wheezing, no crackles. Normal respiratory effort. No accessory muscle use.  Cardiovascular: Regular rate and rhythm, no murmurs / rubs / gallops. No extremity edema. 2+ pedal pulses. No carotid bruits.  Abdomen: no tenderness, no masses palpated. No hepatosplenomegaly. Bowel sounds positive.  Musculoskeletal: no clubbing / cyanosis. No joint deformity upper and lower extremities. Good ROM, no contractures. Normal muscle tone.  Skin: no rashes, lesions, ulcers. No induration Neurologic: CN 2-12 grossly intact. Sensation intact, DTR normal. Strength 5/5 in all 4.  Psychiatric: Normal judgment and insight. Alert and oriented x 3. Normal mood.    Labs on Admission: I have personally reviewed following labs and imaging studies  CBC:  Recent Labs Lab 05/15/17 1640  WBC 12.9*  HGB 14.1  HCT 42.5  MCV 84.8  PLT 017   Basic Metabolic Panel:  Recent Labs Lab 05/15/17 1529  NA 136  K 2.9*  CL 95*  CO2 27  GLUCOSE 97  BUN 12  CREATININE 0.77  CALCIUM 8.8*   GFR: CrCl cannot be calculated (Unknown ideal weight.). Liver Function Tests:  Recent Labs Lab 05/15/17 1529  AST  42*  ALT 19  ALKPHOS 51  BILITOT 1.1  PROT 6.3*  ALBUMIN 3.2*   No results for input(s): LIPASE, AMYLASE in the last 168 hours. No results for input(s): AMMONIA in the last 168 hours. Coagulation Profile: No results for input(s): INR, PROTIME in the last 168 hours. Cardiac Enzymes: No results for input(s): CKTOTAL, CKMB, CKMBINDEX, TROPONINI in the last 168 hours. BNP (last 3 results) No results for input(s): PROBNP in the last 8760 hours. HbA1C: No results for input(s): HGBA1C in the last 72 hours. CBG: No results  for input(s): GLUCAP in the last 168 hours. Lipid Profile: No results for input(s): CHOL, HDL, LDLCALC, TRIG, CHOLHDL, LDLDIRECT in the last 72 hours. Thyroid Function Tests: No results for input(s): TSH, T4TOTAL, FREET4, T3FREE, THYROIDAB in the last 72 hours. Anemia Panel: No results for input(s): VITAMINB12, FOLATE, FERRITIN, TIBC, IRON, RETICCTPCT in the last 72 hours. Urine analysis:    Component Value Date/Time   COLORURINE AMBER (A) 05/15/2017 1926   APPEARANCEUR HAZY (A) 05/15/2017 1926   LABSPEC 1.020 05/15/2017 1926   PHURINE 6.0 05/15/2017 1926   GLUCOSEU NEGATIVE 05/15/2017 1926   HGBUR NEGATIVE 05/15/2017 [place] NEGATIVE 05/15/2017 1926   KETONESUR 80 (A) 05/15/2017 1926   PROTEINUR 30 (A) 05/15/2017 1926   UROBILINOGEN 1.0 03/10/2015 1459   NITRITE NEGATIVE 05/15/2017 1926   LEUKOCYTESUR NEGATIVE 05/15/2017 1926    Radiological Exams on Admission: Dg Chest 2 View  Result Date: 05/15/2017 CLINICAL DATA:  Chest pain for several days EXAM: CHEST  2 VIEW COMPARISON:  05/05/2017 FINDINGS: Cardiac shadow is stable. The lungs are clear bilaterally. Degenerative changes about shoulder joints are noted. Changes of prior vertebral augmentation are again seen and stable. No new focal abnormality is noted. IMPRESSION: No active cardiopulmonary disease. Electronically Signed   By: Inez Catalina M.D.   On: 05/15/2017 15:59    EKG: Independently  reviewed.  Assessment/Plan Principal Problem:   Intractable nausea and vomiting Active Problems:   Rheumatoid arthritis (HCC)   Tachycardia with heart rate 100-120 beats per minute   DM (diabetes mellitus) ([place])   E-coli UTI    1. Intractable N/V - 1. DDx includes adrenal insufficiency, odontogenic bacteremia, partially treated UTI, gastroparesis, obstruction, other 2. Will check cortisol level, given the 1 month + duration of symptoms and >1 month off of steroids, doesn't appear to be severe adrenal crisis to mandate emergency empiric steroid treatment now.  BPs are certainly stable. 3. BCx pending 4. UCx pending, will continue rocephin while inpatient 5. Will try empiric reglan 5mg  IV Q6H 6. Will obtain KUB, will hold off on CT scan since she just had this done 10 days ago 7. Zofran PRN 8. Consider other differential if above work up is negative and symptoms persist 2. E-Coli UTI - 1. UA appears improved 2. UCx pending 3. Will continue rocephin while inpatient 3. RA - continue plaquinel 4. DM2 - 1. Sensitive SSI Q4H 2. Clear liquid diet 3. Holding PO hypoglycemics  DVT prophylaxis: Lovenox Code Status: Full Family Communication: Family at bedside Disposition Plan: Home after admit Consults called: None Admission status: Admit to inpatient - failed outpatient work up, needs a comprehensive inpatient work up at this point for the differential of causes for her N/V   Demetrius Mahler, [facility] Pager [phone]  If 7AM-7PM, please contact day team taking care of patient [email or web address] Password [email or web address]  05/15/2017, 9:09 PM

## 2017-05-15 NOTE — ED Notes (Signed)
Patient transported to X-ray 

## 2017-05-16 ENCOUNTER — Encounter (HOSPITAL_COMMUNITY): Payer: Self-pay | Admitting: General Practice

## 2017-05-16 DIAGNOSIS — R000 Tachycardia, unspecified: Secondary | ICD-10-CM

## 2017-05-16 LAB — CBG MONITORING, ED
GLUCOSE-CAPILLARY: 70 mg/dL (ref 65–99)
Glucose-Capillary: 78 mg/dL (ref 65–99)
Glucose-Capillary: 76 mg/dL (ref 65–99)

## 2017-05-16 LAB — CBC
HCT: 37.5 % (ref 36.0–46.0)
Hemoglobin: 12.6 g/dL (ref 12.0–15.0)
MCH: 28.6 pg (ref 26.0–34.0)
MCHC: 33.6 g/dL (ref 30.0–36.0)
MCV: 85.2 fL (ref 78.0–100.0)
Platelets: 341 10*3/uL (ref 150–400)
RBC: 4.4 MIL/uL (ref 3.87–5.11)
RDW: 14.9 % (ref 11.5–15.5)
WBC: 10.2 10*3/uL (ref 4.0–10.5)

## 2017-05-16 LAB — GLUCOSE, CAPILLARY
GLUCOSE-CAPILLARY: 121 mg/dL — AB (ref 65–99)
GLUCOSE-CAPILLARY: 127 mg/dL — AB (ref 65–99)
Glucose-Capillary: 103 mg/dL — ABNORMAL HIGH (ref 65–99)

## 2017-05-16 LAB — BASIC METABOLIC PANEL
Anion gap: 11 (ref 5–15)
BUN: 11 mg/dL (ref 6–20)
CO2: 24 mmol/L (ref 22–32)
Calcium: 7.9 mg/dL — ABNORMAL LOW (ref 8.9–10.3)
Chloride: 102 mmol/L (ref 101–111)
Creatinine, Ser: 0.66 mg/dL (ref 0.44–1.00)
GFR calc Af Amer: >60 mL/min (ref >60)
GFR calc non Af Amer: >60 mL/min (ref >60)
Glucose, Bld: 88 mg/dL (ref 65–99)
Potassium: 2.9 mmol/L — ABNORMAL LOW (ref 3.5–5.1)
Sodium: 137 mmol/L (ref 135–145)

## 2017-05-16 LAB — MAGNESIUM: Magnesium: 1.4 mg/dL — ABNORMAL LOW (ref 1.7–2.4)

## 2017-05-16 MED ORDER — MAGNESIUM SULFATE 2 GM/50ML IV SOLN
2.0000 g | Freq: Once | INTRAVENOUS | Status: AC
  Administered 2017-05-16: 2 g via INTRAVENOUS
  Filled 2017-05-16: qty 50

## 2017-05-16 MED ORDER — POTASSIUM CHLORIDE CRYS ER 20 MEQ PO TBCR
40.0000 meq | EXTENDED_RELEASE_TABLET | Freq: Once | ORAL | Status: AC
  Administered 2017-05-16: 40 meq via ORAL
  Filled 2017-05-16: qty 2

## 2017-05-16 MED ORDER — BOOST / RESOURCE BREEZE PO LIQD
1.0000 | Freq: Three times a day (TID) | ORAL | Status: DC
  Administered 2017-05-16 – 2017-05-18 (×4): 1 via ORAL

## 2017-05-16 MED ORDER — INFLUENZA VAC SPLIT HIGH-DOSE 0.5 ML IM SUSY
0.5000 mL | PREFILLED_SYRINGE | INTRAMUSCULAR | Status: AC
  Administered 2017-05-18: 0.5 mL via INTRAMUSCULAR
  Filled 2017-05-16 (×2): qty 0.5

## 2017-05-16 MED ORDER — HYDROCODONE-ACETAMINOPHEN 5-325 MG PO TABS
1.0000 | ORAL_TABLET | Freq: Four times a day (QID) | ORAL | Status: DC | PRN
Start: 2017-05-16 — End: 2017-05-19
  Administered 2017-05-16 – 2017-05-19 (×7): 1 via ORAL
  Filled 2017-05-16 (×7): qty 1

## 2017-05-16 NOTE — ED Notes (Signed)
Bladder scan 250 Admitting aware.

## 2017-05-16 NOTE — ED Notes (Signed)
Pt c/o joint pain from arthritis, requesting "steroid pill" like she takes at home, refused PRN pain medications. Admitting MD paged.

## 2017-05-16 NOTE — ED Notes (Signed)
Meal tray provided.

## 2017-05-16 NOTE — ED Notes (Signed)
Patient given breakfast tray.

## 2017-05-16 NOTE — Progress Notes (Signed)
PROGRESS NOTE    [name]  WLN:[id number] DOB: 12-18-1935 DOA: 05/15/2017 PCP: Nolene Ebbs, MD   Chief Complaint  Patient presents with  . Abdominal Pain  . Chest Pain    Brief Narrative:  HPI on 05/15/2017 by Dr. Jennette Kettle [name] is a 81 y.o. female with medical history significant of RA, frequent UTIs, uncontrolled DM2 with A1C of 10.1.  Patient has been having ~1 month history of epigastric abd pain, NBNB emesis, decreased PO intake.  Thinks last BM was about a week ago. CT abd / pelvis done 10 days ago showed no acute abnormalities.  Was started on PO reglan. She has also had 3 day history of urinary urgency / frequency (although she didn't have this 10 days ago). UA was positive 10 days ago for E.Coli UTI, she confirms she was started on cipro after cultures came back (I guess someone was able to get a-hold of her), looks like this was started around 05/08/17 and has been taking that (although I wonder how much shes kept down with the N/V).  Assessment & Plan   Intractable nausea and vomiting -Has been ongoing for approximately one month -Unknown etiology, possibly due to gastroparesis versus urinary tract infection, versus adrenal insufficiency -Abdominal x-ray showed unremarkable bowel gas pattern -Patient had abdominal CT done on 05/05/2017 which showed no acute abnormality. -Cortisol level 7.3 (PM) -Blood and urine cultures pending -Continue Reglan and antiemetics  -Continue clear liquid diet -May need GI consult  Escherichia coli urinary tract infection -Patient was started on ciprofloxacin on 05/08/2017 -Urine culture -Continue ceftriaxone  Rheumatoid arthritis -Continue Plaquenil  Diabetes mellitus, type II -Januvia, metformin held -Placed on and some sliding scale a CBG monitoring  GERD -Continue PPI  Hyperlipidemia -Continue statin  Hypokalemia/hypomagnesmiae -Likely secondary to GI losses, we'll continue to replace and monitor  BMP -Magnesium level 1.4, will replace and continue to monitor  DVT Prophylaxis  Lovenox  Code Status: Full  Family Communication: None at bedside  Disposition Plan: Admitted, pending improvement in symptoms.   Consultants None  Procedures  None  Antibiotics   Anti-infectives    Start     Dose/Rate Route Frequency Ordered Stop   05/16/17 1830  cefTRIAXone (ROCEPHIN) 1 g in dextrose 5 % 50 mL IVPB     1 g 100 mL/hr over 30 Minutes Intravenous Every 24 hours 05/15/17 2045     05/16/17 0726  hydroxychloroquine (PLAQUENIL) tablet 200 mg     200 mg Oral 2 times daily 05/15/17 2103     05/15/17 1515  cefTRIAXone (ROCEPHIN) 1 g in dextrose 5 % 50 mL IVPB     1 g 100 mL/hr over 30 Minutes Intravenous  Once 05/15/17 1500 05/15/17 1957      Subjective:   [name] seen and examined today.  Feels mildly better. Able to tolerate some of her diet without current nausea or vomiting. Denies chest pain, shortness of breath, dizziness, headache, diarrhea or constipation. Feels she has to urinate and was unable to.  Objective:   Vitals:   05/16/17 1117 05/16/17 1130 05/16/17 1343 05/16/17 1458  BP: 131/75 137/73 139/87 (!) 116/51  Pulse: 90 (!) 101 93 (!) 110  Resp: (!) 24 (!) 21 18 20   Temp:   97.9 F (36.6 C) 98 F (36.7 C)  TempSrc:   Oral Oral  SpO2: 99% 97% 100% 100%  Weight:      Height:        Intake/Output Summary (Last 24 hours)  at 05/16/17 1525 Last data filed at 05/16/17 1137  Gross per 24 hour  Intake              509 ml  Output              150 ml  Net              359 ml   Filed Weights   05/15/17 2300  Weight: 64 kg (141 lb 1.5 oz)    Exam  General: Well developed, well nourished, NAD, appears stated age  HEENT: NCAT, PERRLA, EOMI, Anicteic Sclera, mucous membranes moist.   Neck: Supple, no JVD, no masses  Cardiovascular: S1 S2 auscultated, no rubs, murmurs or gallops. Regular rate and rhythm.  Respiratory: Clear to auscultation bilaterally  with equal chest rise  Abdomen: Soft, nontender, nondistended, + bowel sounds  Extremities: warm dry without cyanosis clubbing or edema  Neuro: AAOx3, cranial nerves grossly intact. Strength 5/5 in patient's upper and lower extremities bilaterally  Skin: Without rashes exudates or nodules  Psych: Normal affect and demeanor with intact judgement and insight   Data Reviewed: I have personally reviewed following labs and imaging studies  CBC:  Recent Labs Lab 05/15/17 1640 05/16/17 0355  WBC 12.9* 10.2  HGB 14.1 12.6  HCT 42.5 37.5  MCV 84.8 85.2  PLT 355 160   Basic Metabolic Panel:  Recent Labs Lab 05/15/17 1529 05/16/17 0355  NA 136 137  K 2.9* 2.9*  CL 95* 102  CO2 27 24  GLUCOSE 97 88  BUN 12 11  CREATININE 0.77 0.66  CALCIUM 8.8* 7.9*  MG  --  1.4*   GFR: Estimated Creatinine Clearance: 49.2 mL/min (by C-G formula based on SCr of 0.66 mg/dL). Liver Function Tests:  Recent Labs Lab 05/15/17 1529  AST 42*  ALT 19  ALKPHOS 51  BILITOT 1.1  PROT 6.3*  ALBUMIN 3.2*   No results for input(s): LIPASE, AMYLASE in the last 168 hours. No results for input(s): AMMONIA in the last 168 hours. Coagulation Profile: No results for input(s): INR, PROTIME in the last 168 hours. Cardiac Enzymes: No results for input(s): CKTOTAL, CKMB, CKMBINDEX, TROPONINI in the last 168 hours. BNP (last 3 results) No results for input(s): PROBNP in the last 8760 hours. HbA1C: No results for input(s): HGBA1C in the last 72 hours. CBG:  Recent Labs Lab 05/15/17 2334 05/16/17 0331 05/16/17 0749 05/16/17 1222  GLUCAP 76 70 78 76   Lipid Profile: No results for input(s): CHOL, HDL, LDLCALC, TRIG, CHOLHDL, LDLDIRECT in the last 72 hours. Thyroid Function Tests: No results for input(s): TSH, T4TOTAL, FREET4, T3FREE, THYROIDAB in the last 72 hours. Anemia Panel: No results for input(s): VITAMINB12, FOLATE, FERRITIN, TIBC, IRON, RETICCTPCT in the last 72 hours. Urine  analysis:    Component Value Date/Time   COLORURINE AMBER (A) 05/15/2017 1926   APPEARANCEUR HAZY (A) 05/15/2017 1926   LABSPEC 1.020 05/15/2017 1926   PHURINE 6.0 05/15/2017 1926   GLUCOSEU NEGATIVE 05/15/2017 1926   HGBUR NEGATIVE 05/15/2017 1926   BILIRUBINUR NEGATIVE 05/15/2017 1926   KETONESUR 80 (A) 05/15/2017 1926   PROTEINUR 30 (A) 05/15/2017 1926   UROBILINOGEN 1.0 03/10/2015 1459   NITRITE NEGATIVE 05/15/2017 1926   LEUKOCYTESUR NEGATIVE 05/15/2017 1926   Sepsis Labs: @LABRCNTIP (procalcitonin:4,lacticidven:4)  )No results found for this or any previous visit (from the past 240 hour(s)).    Radiology Studies: Dg Chest 2 View  Result Date: 05/15/2017 CLINICAL DATA:  Chest pain for several  days EXAM: CHEST  2 VIEW COMPARISON:  05/05/2017 FINDINGS: Cardiac shadow is stable. The lungs are clear bilaterally. Degenerative changes about shoulder joints are noted. Changes of prior vertebral augmentation are again seen and stable. No new focal abnormality is noted. IMPRESSION: No active cardiopulmonary disease. Electronically Signed   By: Inez Catalina M.D.   On: 05/15/2017 15:59   Dg Abd 1 View  Result Date: 05/15/2017 CLINICAL DATA:  Nausea and vomiting times 2-3 weeks EXAM: ABDOMEN - 1 VIEW COMPARISON:  CT 05/05/2017 FINDINGS: The bowel gas pattern is normal. No radiopaque calculi. Stable phleboliths in the pelvis with injection granulomata overlying the right gluteal region. Thoracolumbar spondylosis with vertebral augmentation noted at T11 and T12. Mild bilateral joint space narrowing of the hips. The bony pelvis appears intact. IMPRESSION: Unremarkable bowel gas pattern. Thoracolumbar spondylosis with vertebral augmentation at T11 and T12. Electronically Signed   By: Ashley Royalty M.D.   On: 05/15/2017 21:28     Scheduled Meds: . aspirin EC  81 mg Oral Daily  . atorvastatin  40 mg Oral QHS  . enoxaparin (LOVENOX) injection  40 mg Subcutaneous Q24H  . feeding supplement  1  Container Oral TID BM  . hydroxychloroquine  200 mg Oral BID  . insulin aspart  0-9 Units Subcutaneous Q4H  . metoCLOPramide (REGLAN) injection  5 mg Intravenous Q6H  . pantoprazole  40 mg Oral BID   Continuous Infusions: . sodium chloride Stopped (05/16/17 1057)  . cefTRIAXone (ROCEPHIN)  IV       LOS: 1 day   Time Spent in minutes   30 minutes  Aquita Simmering D.O. on 05/16/2017 at 3:25 PM  Between 7am to 7pm - Pager - [phone]  After 7pm go to [email or web address] - password [email or web address]  And look for the night coverage person covering for me after hours  [facility] Office  [phone]

## 2017-05-16 NOTE — ED Notes (Signed)
Attempted to call report

## 2017-05-17 DIAGNOSIS — N390 Urinary tract infection, site not specified: Secondary | ICD-10-CM

## 2017-05-17 DIAGNOSIS — B9620 Unspecified Escherichia coli [E. coli] as the cause of diseases classified elsewhere: Secondary | ICD-10-CM

## 2017-05-17 DIAGNOSIS — R197 Diarrhea, unspecified: Secondary | ICD-10-CM

## 2017-05-17 DIAGNOSIS — E876 Hypokalemia: Secondary | ICD-10-CM

## 2017-05-17 DIAGNOSIS — R112 Nausea with vomiting, unspecified: Principal | ICD-10-CM

## 2017-05-17 LAB — POTASSIUM: POTASSIUM: 3.5 mmol/L (ref 3.5–5.1)

## 2017-05-17 LAB — GLUCOSE, CAPILLARY
GLUCOSE-CAPILLARY: 123 mg/dL — AB (ref 65–99)
GLUCOSE-CAPILLARY: 82 mg/dL (ref 65–99)
Glucose-Capillary: 101 mg/dL — ABNORMAL HIGH (ref 65–99)
Glucose-Capillary: 82 mg/dL (ref 65–99)
Glucose-Capillary: 88 mg/dL (ref 65–99)
Glucose-Capillary: 88 mg/dL (ref 65–99)

## 2017-05-17 LAB — CBC
HEMATOCRIT: 34.9 % — AB (ref 36.0–46.0)
HEMOGLOBIN: 12.1 g/dL (ref 12.0–15.0)
MCH: 29.6 pg (ref 26.0–34.0)
MCHC: 34.7 g/dL (ref 30.0–36.0)
MCV: 85.3 fL (ref 78.0–100.0)
Platelets: 291 10*3/uL (ref 150–400)
RBC: 4.09 MIL/uL (ref 3.87–5.11)
RDW: 15.6 % — ABNORMAL HIGH (ref 11.5–15.5)
WBC: 10 10*3/uL (ref 4.0–10.5)

## 2017-05-17 LAB — BASIC METABOLIC PANEL
Anion gap: 11 (ref 5–15)
BUN: 6 mg/dL (ref 6–20)
CHLORIDE: 104 mmol/L (ref 101–111)
CO2: 23 mmol/L (ref 22–32)
CREATININE: 0.62 mg/dL (ref 0.44–1.00)
Calcium: 7.9 mg/dL — ABNORMAL LOW (ref 8.9–10.3)
GFR calc Af Amer: >60 mL/min (ref >60)
GFR calc non Af Amer: >60 mL/min (ref >60)
Glucose, Bld: 81 mg/dL (ref 65–99)
POTASSIUM: 2.5 mmol/L — AB (ref 3.5–5.1)
Sodium: 138 mmol/L (ref 135–145)

## 2017-05-17 LAB — MAGNESIUM: Magnesium: 1.7 mg/dL (ref 1.7–2.4)

## 2017-05-17 MED ORDER — METOCLOPRAMIDE HCL 5 MG/ML IJ SOLN
10.0000 mg | Freq: Four times a day (QID) | INTRAMUSCULAR | Status: DC
  Administered 2017-05-18: 10 mg via INTRAVENOUS
  Filled 2017-05-17: qty 2

## 2017-05-17 MED ORDER — MAGNESIUM SULFATE 2 GM/50ML IV SOLN
2.0000 g | Freq: Once | INTRAVENOUS | Status: DC
  Filled 2017-05-17: qty 50

## 2017-05-17 MED ORDER — MAGNESIUM SULFATE 2 GM/50ML IV SOLN
2.0000 g | Freq: Once | INTRAVENOUS | Status: AC
  Administered 2017-05-17: 2 g via INTRAVENOUS
  Filled 2017-05-17 (×2): qty 50

## 2017-05-17 MED ORDER — DIPHENHYDRAMINE HCL 25 MG PO CAPS
25.0000 mg | ORAL_CAPSULE | Freq: Once | ORAL | Status: AC
  Administered 2017-05-18: 25 mg via ORAL
  Filled 2017-05-17: qty 1

## 2017-05-17 MED ORDER — POTASSIUM CHLORIDE IN NACL 40-0.9 MEQ/L-% IV SOLN
INTRAVENOUS | Status: DC
  Administered 2017-05-17: 50 mL/h via INTRAVENOUS
  Filled 2017-05-17 (×2): qty 1000

## 2017-05-17 MED ORDER — POTASSIUM CHLORIDE 10 MEQ/100ML IV SOLN: 10.0000 meq | INTRAVENOUS | Status: DC

## 2017-05-17 MED ORDER — POTASSIUM CHLORIDE 10 MEQ/100ML IV SOLN
10.0000 meq | INTRAVENOUS | Status: AC
  Administered 2017-05-17 (×3): 10 meq via INTRAVENOUS
  Filled 2017-05-17 (×3): qty 100

## 2017-05-17 MED ORDER — POTASSIUM CHLORIDE CRYS ER 20 MEQ PO TBCR
40.0000 meq | EXTENDED_RELEASE_TABLET | Freq: Once | ORAL | Status: AC
  Administered 2017-05-17: 40 meq via ORAL
  Filled 2017-05-17: qty 2

## 2017-05-17 NOTE — Progress Notes (Signed)
This patient was a p[astoral care visit. Patient on her bed but awake. No family on-site but patient talked of good relationship with family members. Patient was very receptive and appreciative of chaplains visitation. Chaplain provided reflective listening, compassionate presence and encouragement.  Chaplain Lagretta Loseke.

## 2017-05-17 NOTE — Progress Notes (Signed)
PROGRESS NOTE    [name]  XTK:[id number] DOB: February 23, 1936 DOA: 05/15/2017 PCP: Nolene Ebbs, MD   Chief Complaint  Patient presents with  . Abdominal Pain  . Chest Pain    Brief Narrative:  HPI on 05/15/2017 by Dr. Jennette Kettle [name] is a 81 y.o. female with medical history significant of RA, frequent UTIs, uncontrolled DM2 with A1C of 10.1.  Patient has been having ~1 month history of epigastric abd pain, NBNB emesis, decreased PO intake.  Thinks last BM was about a week ago. CT abd / pelvis done 10 days ago showed no acute abnormalities.  Was started on PO reglan. She has also had 3 day history of urinary urgency / frequency (although she didn't have this 10 days ago). UA was positive 10 days ago for E.Coli UTI, she confirms she was started on cipro after cultures came back (I guess someone was able to get a-hold of her), looks like this was started around 05/08/17 and has been taking that (although I wonder how much shes kept down with the N/V).  Assessment & Plan   Intractable nausea/vomiting/diarrhea -Has been ongoing for approximately one month -Patient had abdominal CT done on 05/05/2017 which showed fluid filled cecum -Continue Reglan- increase dose and antiemetics  -Continue clear liquid diet -stool studies  Escherichia coli urinary tract infection -Patient was started on ciprofloxacin on 05/08/2017 -Urine culture -Continue ceftriaxone  Rheumatoid arthritis -Continue Plaquenil  Diabetes mellitus, type II -Januvia, metformin held -SSI  GERD -Continue PPI  Hyperlipidemia -Continue statin  Hypokalemia/hypomagnesmiae -replace IV -replace magensium as well  DVT Prophylaxis  Lovenox  Code Status: Full  Family Communication: None at bedside  Disposition Plan: PT Eval-- lives with daughter-- nursing reporting weakness   Consultants None  Procedures  None  Antibiotics   Anti-infectives    Start     Dose/Rate Route Frequency Ordered  Stop   05/16/17 1830  cefTRIAXone (ROCEPHIN) 1 g in dextrose 5 % 50 mL IVPB     1 g 100 mL/hr over 30 Minutes Intravenous Every 24 hours 05/15/17 2045     05/16/17 0726  hydroxychloroquine (PLAQUENIL) tablet 200 mg     200 mg Oral 2 times daily 05/15/17 2103     05/15/17 1515  cefTRIAXone (ROCEPHIN) 1 g in dextrose 5 % 50 mL IVPB     1 g 100 mL/hr over 30 Minutes Intravenous  Once 05/15/17 1500 05/15/17 1957      Subjective:   [name] seen and examined today.  Feels mildly better. Able to tolerate some of her diet without current nausea or vomiting. Denies chest pain, shortness of breath, dizziness, headache, diarrhea or constipation. Feels she has to urinate and was unable to.  Objective:   Vitals:   05/16/17 1458 05/16/17 2051 05/17/17 0534 05/17/17 0816  BP: (!) 116/51 135/66 (!) 157/81 128/63  Pulse: (!) 110 100 92 90  Resp: 20 18 20 17   Temp: 98 F (36.7 C) 98.3 F (36.8 C) 97.6 F (36.4 C) 98.4 F (36.9 C)  TempSrc: Oral Oral Oral Oral  SpO2: 100% 98% 99% 100%  Weight:  52.5 kg (115 lb 12.8 oz)    Height:        Intake/Output Summary (Last 24 hours) at 05/17/17 1105 Last data filed at 05/17/17 0923  Gross per 24 hour  Intake          1913.33 ml  Output  453 ml  Net          1460.33 ml   Filed Weights   05/15/17 2300 05/16/17 2051  Weight: 64 kg (141 lb 1.5 oz) 52.5 kg (115 lb 12.8 oz)    Exam  General: NAD  Cardiovascular: rrr  Respiratory: clear  Abdomen: +BS, soft  Extremities: no edema  Neuro: A+Ox3  Skin: no rash  Psych: normal affect   Data Reviewed: I have personally reviewed following labs and imaging studies  CBC:  Recent Labs Lab 05/15/17 1640 05/16/17 0355 05/17/17 0257  WBC 12.9* 10.2 10.0  HGB 14.1 12.6 12.1  HCT 42.5 37.5 34.9*  MCV 84.8 85.2 85.3  PLT 355 341 474   Basic Metabolic Panel:  Recent Labs Lab 05/15/17 1529 05/16/17 0355 05/17/17 0257  NA 136 137 138  K 2.9* 2.9* 2.5*  CL 95* 102  104  CO2 27 24 23   GLUCOSE 97 88 81  BUN 12 11 6   CREATININE 0.77 0.66 0.62  CALCIUM 8.8* 7.9* 7.9*  MG  --  1.4* 1.7   GFR: Estimated Creatinine Clearance: 47.3 mL/min (by C-G formula based on SCr of 0.62 mg/dL). Liver Function Tests:  Recent Labs Lab 05/15/17 1529  AST 42*  ALT 19  ALKPHOS 51  BILITOT 1.1  PROT 6.3*  ALBUMIN 3.2*   No results for input(s): LIPASE, AMYLASE in the last 168 hours. No results for input(s): AMMONIA in the last 168 hours. Coagulation Profile: No results for input(s): INR, PROTIME in the last 168 hours. Cardiac Enzymes: No results for input(s): CKTOTAL, CKMB, CKMBINDEX, TROPONINI in the last 168 hours. BNP (last 3 results) No results for input(s): PROBNP in the last 8760 hours. HbA1C: No results for input(s): HGBA1C in the last 72 hours. CBG:  Recent Labs Lab 05/16/17 2043 05/17/17 0000 05/17/17 0504 05/17/17 0753 05/17/17 0806  GLUCAP 127* 121* 101* 82 88   Lipid Profile: No results for input(s): CHOL, HDL, LDLCALC, TRIG, CHOLHDL, LDLDIRECT in the last 72 hours. Thyroid Function Tests: No results for input(s): TSH, T4TOTAL, FREET4, T3FREE, THYROIDAB in the last 72 hours. Anemia Panel: No results for input(s): VITAMINB12, FOLATE, FERRITIN, TIBC, IRON, RETICCTPCT in the last 72 hours. Urine analysis:    Component Value Date/Time   COLORURINE AMBER (A) 05/15/2017 1926   APPEARANCEUR HAZY (A) 05/15/2017 1926   LABSPEC 1.020 05/15/2017 1926   PHURINE 6.0 05/15/2017 1926   GLUCOSEU NEGATIVE 05/15/2017 [place] NEGATIVE 05/15/2017 [place] NEGATIVE 05/15/2017 1926   KETONESUR 80 (A) 05/15/2017 1926   PROTEINUR 30 (A) 05/15/2017 1926   UROBILINOGEN 1.0 03/10/2015 1459   NITRITE NEGATIVE 05/15/2017 1926   LEUKOCYTESUR NEGATIVE 05/15/2017 1926      Recent Results (from the past 240 hour(s))  Culture, blood (routine x 2)     Status: None (Preliminary result)   Collection Time: 05/15/17  4:40 PM  Result Value Ref  Range Status   Specimen Description BLOOD LEFT ANTECUBITAL  Final   Special Requests   Final    BOTTLES DRAWN AEROBIC AND ANAEROBIC Blood Culture adequate volume   Culture NO GROWTH < 24 HOURS  Final   Report Status PENDING  Incomplete  Urine culture     Status: Abnormal (Preliminary result)   Collection Time: 05/15/17  7:26 PM  Result Value Ref Range Status   Specimen Description URINE, CATHETERIZED  Final   Special Requests NONE  Final   Culture >=100,000 COLONIES/mL GRAM NEGATIVE RODS (A)  Final  Report Status PENDING  Incomplete  Culture, blood (routine x 2)     Status: None (Preliminary result)   Collection Time: 05/15/17  9:43 PM  Result Value Ref Range Status   Specimen Description BLOOD RIGHT WRIST  Final   Special Requests IN PEDIATRIC BOTTLE Blood Culture adequate volume  Final   Culture NO GROWTH < 24 HOURS  Final   Report Status PENDING  Incomplete      Radiology Studies: Dg Chest 2 View  Result Date: 05/15/2017 CLINICAL DATA:  Chest pain for several days EXAM: CHEST  2 VIEW COMPARISON:  05/05/2017 FINDINGS: Cardiac shadow is stable. The lungs are clear bilaterally. Degenerative changes about shoulder joints are noted. Changes of prior vertebral augmentation are again seen and stable. No new focal abnormality is noted. IMPRESSION: No active cardiopulmonary disease. Electronically Signed   By: Inez Catalina M.D.   On: 05/15/2017 15:59   Dg Abd 1 View  Result Date: 05/15/2017 CLINICAL DATA:  Nausea and vomiting times 2-3 weeks EXAM: ABDOMEN - 1 VIEW COMPARISON:  CT 05/05/2017 FINDINGS: The bowel gas pattern is normal. No radiopaque calculi. Stable phleboliths in the pelvis with injection granulomata overlying the right gluteal region. Thoracolumbar spondylosis with vertebral augmentation noted at T11 and T12. Mild bilateral joint space narrowing of the hips. The bony pelvis appears intact. IMPRESSION: Unremarkable bowel gas pattern. Thoracolumbar spondylosis with vertebral  augmentation at T11 and T12. Electronically Signed   By: Ashley Royalty M.D.   On: 05/15/2017 21:28     Scheduled Meds: . aspirin EC  81 mg Oral Daily  . atorvastatin  40 mg Oral QHS  . enoxaparin (LOVENOX) injection  40 mg Subcutaneous Q24H  . feeding supplement  1 Container Oral TID BM  . hydroxychloroquine  200 mg Oral BID  . Influenza vac split quadrivalent PF  0.5 mL Intramuscular Tomorrow-1000  . insulin aspart  0-9 Units Subcutaneous Q4H  . metoCLOPramide (REGLAN) injection  5 mg Intravenous Q6H  . pantoprazole  40 mg Oral BID   Continuous Infusions: . sodium chloride 100 mL/hr at 05/17/17 0302  . cefTRIAXone (ROCEPHIN)  IV Stopped (05/16/17 1930)  . magnesium sulfate 1 - 4 g bolus IVPB       LOS: 2 days   Time Spent in minutes   30 minutes  JESSICA U VANN D.O. on 05/17/2017 at 11:05 AM   [facility] Office  [phone]

## 2017-05-17 NOTE — Progress Notes (Signed)
Initial Nutrition Assessment  DOCUMENTATION CODES:   Severe malnutrition in context of chronic illness  INTERVENTION:    Continue Boost Breeze po TID, each supplement provides 250 kcal and 9 grams of protein  NUTRITION DIAGNOSIS:   Malnutrition (severe) related to chronic illness (unknown etiology: gastroparesis vs adrenal insufficiency) as evidenced by energy intake < or equal to 50% for > or equal to 1 month, percent weight loss   GOAL:   Patient will meet greater than or equal to 90% of their needs  MONITOR:   PO intake, Supplement acceptance, Labs, Weight trends, Skin, I & O's  REASON FOR ASSESSMENT:   Malnutrition Screening Tool  ASSESSMENT:   80 y.o. female with medical history significant of RA, frequent UTIs, uncontrolled DM2 with A1C of 10.1.  Patient has been having ~1 month history of epigastric abd pain, NBNB emesis, decreased PO intake.  Thinks last BM was about a week ago.  Pt reports a poor appetite with N/V/D x 1 month. Was consuming mostly broth. She shares her poor oral hygiene caused a foul taste in her mouth. Endorses a 100 lbs weight loss. Per readings below, pt has had a 16% loss since 02/2017. Severe for time frame.  Has Boost Breeze ordered and is enjoys drinking. Medications reviewed and include Reglan, Colace and Protonix.  Labs reviewed. K 2.5 (L). CBG's [id number].  Unable to complete Nutrition-Focused physical exam. Pt having to urgently use the bathroom. Suspect muscle and fat depletion.   Diet Order:  Diet clear liquid Room service appropriate? Yes; Fluid consistency: Thin  Skin:  Reviewed, no issues  Last BM:  10/20  Height:   Ht Readings from Last 1 Encounters:  05/15/17 5\' 4"  (1.626 m)    Weight:   Wt Readings from Last 1 Encounters:  05/16/17 115 lb 12.8 oz (52.5 kg)   Wt Readings from Last 10 Encounters:  05/16/17 115 lb 12.8 oz (52.5 kg)  03/02/17 138 lb (62.6 kg)  01/09/17 141 lb 12.1 oz (64.3 kg)  04/15/16 135 lb  (61.2 kg)  06/15/15 137 lb 2 oz (62.2 kg)  03/10/15 147 lb (66.7 kg)  01/29/15 147 lb (66.7 kg)  01/23/15 150 lb 5.7 oz (68.2 kg)  06/19/14 144 lb (65.3 kg)  04/09/14 149 lb 2.1 oz (67.6 kg)    Ideal Body Weight:  54.5 kg  BMI:  Body mass index is 19.88 kg/m.  Estimated Nutritional Needs:   Kcal:  1550-1750  Protein:  80-95 gm  Fluid:  1.5-1.7 L  EDUCATION NEEDS:   No education needs identified at this time  Arthur Holms, RD, LDN Pager #: [phone] After-Hours Pager #: [phone]

## 2017-05-18 ENCOUNTER — Inpatient Hospital Stay (HOSPITAL_COMMUNITY): Payer: Medicare Other

## 2017-05-18 DIAGNOSIS — N182 Chronic kidney disease, stage 2 (mild): Secondary | ICD-10-CM

## 2017-05-18 DIAGNOSIS — E1122 Type 2 diabetes mellitus with diabetic chronic kidney disease: Secondary | ICD-10-CM

## 2017-05-18 LAB — GLUCOSE, CAPILLARY
GLUCOSE-CAPILLARY: 108 mg/dL — AB (ref 65–99)
GLUCOSE-CAPILLARY: 135 mg/dL — AB (ref 65–99)
Glucose-Capillary: 90 mg/dL (ref 65–99)
Glucose-Capillary: 93 mg/dL (ref 65–99)
Glucose-Capillary: 123 mg/dL — ABNORMAL HIGH (ref 65–99)
Glucose-Capillary: 117 mg/dL — ABNORMAL HIGH (ref 65–99)

## 2017-05-18 LAB — URINE CULTURE

## 2017-05-18 MED ORDER — METOCLOPRAMIDE HCL 5 MG/ML IJ SOLN
5.0000 mg | Freq: Four times a day (QID) | INTRAMUSCULAR | Status: DC | PRN
Start: 2017-05-18 — End: 2017-05-19

## 2017-05-18 MED ORDER — POTASSIUM CHLORIDE CRYS ER 20 MEQ PO TBCR
40.0000 meq | EXTENDED_RELEASE_TABLET | Freq: Once | ORAL | Status: AC
  Administered 2017-05-18: 40 meq via ORAL
  Filled 2017-05-18: qty 2

## 2017-05-18 NOTE — Progress Notes (Signed)
Patient c/o she had been unable to get any sleep and wanted something for sleep.  MD called.  Order for Benadryl PO received and given at [place].  Upon reassessment at approximately 0100, the patient was asleep.  At 0255, patient called the front desk.  Upon entering the room, she was crying, stating that the Benadryl gave her the "worst headache of her life".  She c/o of pain behind her eyes and nose, and above the eyes.  She described as throbbing in nature and rated as 10/10.  Mini neuro exam was WNL.  Vicodin 1 tablet given at 0303.  At [place], the patient's headache was 7/10.  [facility] called and made aware.  Stat CT of Head ordered to r/o bleed due to patient being on Lovenox and never having headache like this before.  CT of Head negative for anything acute.  Upon reassessment at 0630, headache now is 5/10.  VS stable.  Will continue to monitor.  [employer] RN-BC, WTA.

## 2017-05-18 NOTE — Evaluation (Signed)
Physical Therapy Evaluation Patient Details Name: [name] MRN: [id number] DOB: 10/13/1935 Today's Date: 05/18/2017   History of Present Illness  81 y.o. female with medical history significant of RA, frequent UTIs, uncontrolled DM2 admitted with epigastric pain N/V/D  Clinical Impression  Pt pleasant and eager to get OOB. Pt able to walk to the bathroom without assist and encouraged increased mobility. Pt able to mobilize and does not want/need purewick with BSC placed beside chair end of session and RN aware. Pt with decreased activity tolerance, balance and function who will benefit from acute therapy to maximize mobility, independence and function. Recommend daily ambulation with nursing staff.     Follow Up Recommendations Home health PT    Equipment Recommendations  3in1 (PT)    Recommendations for Other Services       Precautions / Restrictions Precautions Precautions: Fall      Mobility  Bed Mobility Overal bed mobility: Modified Independent             General bed mobility comments: with rail and HOB 20 degrees  Transfers Overall transfer level: Modified independent                  Ambulation/Gait Ambulation/Gait assistance: Min guard Ambulation Distance (Feet): 200 Feet Assistive device: None Gait Pattern/deviations: Step-through pattern;Decreased stride length   Gait velocity interpretation: Below normal speed for age/gender General Gait Details: pt with decreased stride, 2 periods of sway with pt able to recover without physical assist. Pt limited by fatigue  Stairs            Wheelchair Mobility    Modified Rankin (Stroke Patients Only)       Balance Overall balance assessment: Needs assistance;History of Falls   Sitting balance-Leahy Scale: Good       Standing balance-Leahy Scale: Good                               Pertinent Vitals/Pain Pain Assessment: 0-10 Pain Score: 3  Pain Location: HA Pain  Descriptors / Indicators: Aching Pain Intervention(s): Limited activity within patient's tolerance    Home Living Family/patient expects to be discharged to:: Private residence Living Arrangements: Alone Available Help at Discharge: Family;Available PRN/intermittently Type of Home: Apartment Home Access: Stairs to enter Entrance Stairs-Rails: Right Entrance Stairs-Number of Steps: 2 Home Layout: One level Home Equipment: Walker - 2 wheels;Toilet riser Additional Comments: ex-husband and niece both stop by daily and transport her and do shopping    Prior Function Level of Independence: Needs assistance   Gait / Transfers Assistance Needed: uses RW at times  ADL's / Homemaking Assistance Needed: pt does her homemaking and ADLs, assist for driving and shopping  Comments: 3 falls in the last year     Hand Dominance        Extremity/Trunk Assessment   Upper Extremity Assessment Upper Extremity Assessment: Overall WFL for tasks assessed    Lower Extremity Assessment Lower Extremity Assessment: Overall WFL for tasks assessed    Cervical / Trunk Assessment Cervical / Trunk Assessment: Normal  Communication   Communication: No difficulties  Cognition Arousal/Alertness: Awake/alert Behavior During Therapy: WFL for tasks assessed/performed Overall Cognitive Status: Within Functional Limits for tasks assessed                                        General Comments  Exercises     Assessment/Plan    PT Assessment Patient needs continued PT services  PT Problem List Decreased mobility;Decreased activity tolerance;Decreased balance       PT Treatment Interventions Gait training;Therapeutic exercise;Patient/family education;DME instruction;Therapeutic activities;Functional mobility training;Stair training;Balance training    PT Goals (Current goals can be found in the Care Plan section)  Acute Rehab PT Goals Patient Stated Goal: return home PT Goal  Formulation: With patient Time For Goal Achievement: 06/01/17 Potential to Achieve Goals: Good    Frequency Min 3X/week   Barriers to discharge Decreased caregiver support      Co-evaluation               AM-PAC PT "6 Clicks" Daily Activity  Outcome Measure Difficulty turning over in bed (including adjusting bedclothes, sheets and blankets)?: None Difficulty moving from lying on back to sitting on the side of the bed? : A Little Difficulty sitting down on and standing up from a chair with arms (e.g., wheelchair, bedside commode, etc,.)?: None Help needed moving to and from a bed to chair (including a wheelchair)?: None Help needed walking in hospital room?: A Little Help needed climbing 3-5 steps with a railing? : A Little 6 Click Score: 21    End of Session   Activity Tolerance: Patient tolerated treatment well Patient left: in chair;with chair alarm set;with call bell/phone within reach Nurse Communication: Mobility status PT Visit Diagnosis: Difficulty in walking, not elsewhere classified (R26.2);History of falling (Z91.81)    Time: 9323-5573 PT Time Calculation (min) (ACUTE ONLY): 19 min   Charges:   PT Evaluation $PT Eval Moderate Complexity: 1 Mod     PT G Codes:        Elwyn Reach, PT [phone]   [place] B Bertha Earwood 05/18/2017, 10:19 AM

## 2017-05-18 NOTE — Progress Notes (Signed)
PROGRESS NOTE    [name]  SAY:[id number] DOB: 04/13/1936 DOA: 05/15/2017 PCP: Nolene Ebbs, MD   Chief Complaint  Patient presents with  . Abdominal Pain  . Chest Pain    Brief Narrative:  HPI on 05/15/2017 by Dr. Jennette Kettle [name] is a 81 y.o. female with medical history significant of RA, frequent UTIs, uncontrolled DM2 with A1C of 10.1.  Patient has been having ~1 month history of epigastric abd pain, NBNB emesis, decreased PO intake.  Thinks last BM was about a week ago. CT abd / pelvis done 10 days ago showed no acute abnormalities.  Was started on PO reglan. She has also had 3 day history of urinary urgency / frequency (although she didn't have this 10 days ago). UA was positive 10 days ago for E.Coli UTI, she confirms she was started on cipro after cultures came back (I guess someone was able to get a-hold of her), looks like this was started around 05/08/17 and has been taking that (although I wonder how much shes kept down with the N/V).  Assessment & Plan   Intractable nausea/vomiting/diarrhea -Has been ongoing for approximately one month -Patient had abdominal CT done on 05/05/2017 which showed fluid filled cecum -Continue Reglan- increase dose and antiemetics  -Continue clear liquid diet -stool studies not needed as diarrhea has resolved  Escherichia coli urinary tract infection -Patient was started on ciprofloxacin on 05/08/2017 -Urine culture showing ecoli still -Continue ceftriaxone x 7 days  (change to PO upon d/c)  Rheumatoid arthritis -Continue Plaquenil  Diabetes mellitus, type II -Januvia, metformin held -SSI  GERD -Continue PPI  Hyperlipidemia -Continue statin  Hypokalemia/hypomagnesmiae -replaced IV -replaced magensium as well Recheck in AM  DVT Prophylaxis  Lovenox  Code Status: Full  Family Communication: None at bedside  Disposition Plan: PT Eval--home health  Consultants None  Procedures  None  Antibiotics    Anti-infectives    Start     Dose/Rate Route Frequency Ordered Stop   05/16/17 1830  cefTRIAXone (ROCEPHIN) 1 g in dextrose 5 % 50 mL IVPB     1 g 100 mL/hr over 30 Minutes Intravenous Every 24 hours 05/15/17 2045     05/16/17 0726  hydroxychloroquine (PLAQUENIL) tablet 200 mg     200 mg Oral 2 times daily 05/15/17 2103     05/15/17 1515  cefTRIAXone (ROCEPHIN) 1 g in dextrose 5 % 50 mL IVPB     1 g 100 mL/hr over 30 Minutes Intravenous  Once 05/15/17 1500 05/15/17 1957      Subjective:   Able to eat today, has been up walking around  Objective:   Vitals:   05/18/17 0308 05/18/17 0415 05/18/17 0533 05/18/17 0931  BP: (!) 135/57 (!) 127/52 (!) 146/60 (!) 126/59  Pulse: (!) 106 95 95 96  Resp: 18 16 18 18   Temp: 98.5 F (36.9 C) 98.3 F (36.8 C) 98.3 F (36.8 C) 98 F (36.7 C)  TempSrc: Oral Oral Oral Oral  SpO2: 99% 99% 97% 99%  Weight:      Height:        Intake/Output Summary (Last 24 hours) at 05/18/17 1402 Last data filed at 05/18/17 0900  Gross per 24 hour  Intake              995 ml  Output             1425 ml  Net             -430 ml  Filed Weights   05/15/17 2300 05/16/17 2051 05/17/17 2107  Weight: 64 kg (141 lb 1.5 oz) 52.5 kg (115 lb 12.8 oz) 52.2 kg (115 lb)    Exam  General: nad, in chair  Cardiovascular: rrr  Respiratory: clear  Abdomen: +BS    Data Reviewed: I have personally reviewed following labs and imaging studies  CBC:  Recent Labs Lab 05/15/17 1640 05/16/17 0355 05/17/17 0257  WBC 12.9* 10.2 10.0  HGB 14.1 12.6 12.1  HCT 42.5 37.5 34.9*  MCV 84.8 85.2 85.3  PLT 355 341 341   Basic Metabolic Panel:  Recent Labs Lab 05/15/17 1529 05/16/17 0355 05/17/17 0257 05/17/17 1616  NA 136 137 138  --   K 2.9* 2.9* 2.5* 3.5  CL 95* 102 104  --   CO2 27 24 23   --   GLUCOSE 97 88 81  --   BUN 12 11 6   --   CREATININE 0.77 0.66 0.62  --   CALCIUM 8.8* 7.9* 7.9*  --   MG  --  1.4* 1.7  --    GFR: Estimated Creatinine  Clearance: 47 mL/min (by C-G formula based on SCr of 0.62 mg/dL). Liver Function Tests:  Recent Labs Lab 05/15/17 1529  AST 42*  ALT 19  ALKPHOS 51  BILITOT 1.1  PROT 6.3*  ALBUMIN 3.2*   No results for input(s): LIPASE, AMYLASE in the last 168 hours. No results for input(s): AMMONIA in the last 168 hours. Coagulation Profile: No results for input(s): INR, PROTIME in the last 168 hours. Cardiac Enzymes: No results for input(s): CKTOTAL, CKMB, CKMBINDEX, TROPONINI in the last 168 hours. BNP (last 3 results) No results for input(s): PROBNP in the last 8760 hours. HbA1C: No results for input(s): HGBA1C in the last 72 hours. CBG:  Recent Labs Lab 05/17/17 2007 05/17/17 2340 05/18/17 0120 05/18/17 0401 05/18/17 1130  GLUCAP 123* 82 108* 90 93   Lipid Profile: No results for input(s): CHOL, HDL, LDLCALC, TRIG, CHOLHDL, LDLDIRECT in the last 72 hours. Thyroid Function Tests: No results for input(s): TSH, T4TOTAL, FREET4, T3FREE, THYROIDAB in the last 72 hours. Anemia Panel: No results for input(s): VITAMINB12, FOLATE, FERRITIN, TIBC, IRON, RETICCTPCT in the last 72 hours. Urine analysis:    Component Value Date/Time   COLORURINE AMBER (A) 05/15/2017 1926   APPEARANCEUR HAZY (A) 05/15/2017 1926   LABSPEC 1.020 05/15/2017 1926   PHURINE 6.0 05/15/2017 1926   GLUCOSEU NEGATIVE 05/15/2017 [place] NEGATIVE 05/15/2017 [place] NEGATIVE 05/15/2017 1926   KETONESUR 80 (A) 05/15/2017 1926   PROTEINUR 30 (A) 05/15/2017 1926   UROBILINOGEN 1.0 03/10/2015 1459   NITRITE NEGATIVE 05/15/2017 1926   LEUKOCYTESUR NEGATIVE 05/15/2017 1926      Recent Results (from the past 240 hour(s))  Culture, blood (routine x 2)     Status: None (Preliminary result)   Collection Time: 05/15/17  4:40 PM  Result Value Ref Range Status   Specimen Description BLOOD LEFT ANTECUBITAL  Final   Special Requests   Final    BOTTLES DRAWN AEROBIC AND ANAEROBIC Blood Culture adequate  volume   Culture NO GROWTH 3 DAYS  Final   Report Status PENDING  Incomplete  Urine culture     Status: Abnormal   Collection Time: 05/15/17  7:26 PM  Result Value Ref Range Status   Specimen Description URINE, CATHETERIZED  Final   Special Requests NONE  Final   Culture >=100,000 COLONIES/mL ESCHERICHIA COLI (A)  Final  Report Status 05/18/2017 FINAL  Final   Organism ID, Bacteria ESCHERICHIA COLI (A)  Final      Susceptibility   Escherichia coli - MIC*    AMPICILLIN >=32 RESISTANT Resistant     CEFAZOLIN 16 SENSITIVE Sensitive     CEFTRIAXONE <=1 SENSITIVE Sensitive     CIPROFLOXACIN <=0.25 SENSITIVE Sensitive     GENTAMICIN <=1 SENSITIVE Sensitive     IMIPENEM <=0.25 SENSITIVE Sensitive     NITROFURANTOIN <=16 SENSITIVE Sensitive     TRIMETH/SULFA <=20 SENSITIVE Sensitive     AMPICILLIN/SULBACTAM >=32 RESISTANT Resistant     PIP/TAZO <=4 SENSITIVE Sensitive     Extended ESBL NEGATIVE Sensitive     * >=100,000 COLONIES/mL ESCHERICHIA COLI  Culture, blood (routine x 2)     Status: None (Preliminary result)   Collection Time: 05/15/17  9:43 PM  Result Value Ref Range Status   Specimen Description BLOOD RIGHT WRIST  Final   Special Requests IN PEDIATRIC BOTTLE Blood Culture adequate volume  Final   Culture NO GROWTH 3 DAYS  Final   Report Status PENDING  Incomplete      Radiology Studies: Ct Head Wo Contrast  Result Date: 05/18/2017 CLINICAL DATA:  81 y/o F; frontal headache for several days worsening tonight. EXAM: CT HEAD WITHOUT CONTRAST TECHNIQUE: Contiguous axial images were obtained from the base of the skull through the vertex without intravenous contrast. COMPARISON:  06/14/2015 CT head.  01/22/2015 MRI of the head. FINDINGS: Brain: No evidence of acute infarction, hemorrhage, hydrocephalus, extra-axial collection or mass lesion/mass effect. Stable mild chronic microvascular ischemic changes and parenchymal volume loss of the brain. Vascular: Calcific atherosclerosis of  carotid siphons. No hyperdense vessel identified. Skull: Normal. Negative for fracture or focal lesion. Sinuses/Orbits: No acute finding. Other: None. IMPRESSION: 1. No acute intracranial abnormality identified. 2. Stable mild chronic microvascular ischemic changes and parenchymal volume loss of the brain. Electronically Signed   By: Kristine Garbe M.D.   On: 05/18/2017 06:05     Scheduled Meds: . aspirin EC  81 mg Oral Daily  . atorvastatin  40 mg Oral QHS  . enoxaparin (LOVENOX) injection  40 mg Subcutaneous Q24H  . feeding supplement  1 Container Oral TID BM  . hydroxychloroquine  200 mg Oral BID  . Influenza vac split quadrivalent PF  0.5 mL Intramuscular Tomorrow-1000  . insulin aspart  0-9 Units Subcutaneous Q4H  . pantoprazole  40 mg Oral BID   Continuous Infusions: . cefTRIAXone (ROCEPHIN)  IV Stopped (05/17/17 1753)     LOS: 3 days   Time Spent in minutes   30 minutes  Roma Bondar U Damarious Holtsclaw D.O. on 05/18/2017 at 2:02 PM   [place]  [phone]

## 2017-05-19 LAB — URINALYSIS, ROUTINE W REFLEX MICROSCOPIC
BILIRUBIN URINE: NEGATIVE
Glucose, UA: NEGATIVE mg/dL
HGB URINE DIPSTICK: NEGATIVE
Ketones, ur: NEGATIVE mg/dL
Leukocytes, UA: NEGATIVE
Nitrite: NEGATIVE
PH: 6 (ref 5.0–8.0)
Protein, ur: NEGATIVE mg/dL
SPECIFIC GRAVITY, URINE: 1.006 (ref 1.005–1.030)

## 2017-05-19 LAB — BASIC METABOLIC PANEL
Anion gap: 10 (ref 5–15)
CALCIUM: 8.3 mg/dL — AB (ref 8.9–10.3)
CO2: 22 mmol/L (ref 22–32)
CREATININE: 0.59 mg/dL (ref 0.44–1.00)
Chloride: 106 mmol/L (ref 101–111)
GFR calc Af Amer: >60 mL/min (ref >60)
Glucose, Bld: 82 mg/dL (ref 65–99)
Potassium: 3.3 mmol/L — ABNORMAL LOW (ref 3.5–5.1)
SODIUM: 138 mmol/L (ref 135–145)

## 2017-05-19 LAB — CBC
HCT: 32.4 % — ABNORMAL LOW (ref 36.0–46.0)
Hemoglobin: 10.8 g/dL — ABNORMAL LOW (ref 12.0–15.0)
MCH: 28.6 pg (ref 26.0–34.0)
MCHC: 33.3 g/dL (ref 30.0–36.0)
MCV: 85.9 fL (ref 78.0–100.0)
PLATELETS: 250 10*3/uL (ref 150–400)
RBC: 3.77 MIL/uL — AB (ref 3.87–5.11)
RDW: 15.6 % — AB (ref 11.5–15.5)
WBC: 6.6 10*3/uL (ref 4.0–10.5)

## 2017-05-19 LAB — GLUCOSE, CAPILLARY
GLUCOSE-CAPILLARY: 117 mg/dL — AB (ref 65–99)
Glucose-Capillary: 91 mg/dL (ref 65–99)
Glucose-Capillary: 108 mg/dL — ABNORMAL HIGH (ref 65–99)
Glucose-Capillary: 88 mg/dL (ref 65–99)

## 2017-05-19 MED ORDER — CEPHALEXIN 500 MG PO CAPS: 500.0000 mg | ORAL_CAPSULE | Freq: Two times a day (BID) | ORAL | 0 refills | Status: DC

## 2017-05-19 MED ORDER — CEPHALEXIN 500 MG PO CAPS
500.0000 mg | ORAL_CAPSULE | Freq: Two times a day (BID) | ORAL | Status: DC
  Administered 2017-05-19: 500 mg via ORAL
  Filled 2017-05-19: qty 1

## 2017-05-19 MED ORDER — METOCLOPRAMIDE HCL 5 MG/ML IJ SOLN
10.0000 mg | Freq: Once | INTRAMUSCULAR | Status: AC
  Administered 2017-05-19: 10 mg via INTRAVENOUS
  Filled 2017-05-19: qty 2

## 2017-05-19 MED ORDER — DIPHENHYDRAMINE HCL 50 MG/ML IJ SOLN
25.0000 mg | Freq: Once | INTRAMUSCULAR | Status: AC
  Administered 2017-05-19: 25 mg via INTRAVENOUS
  Filled 2017-05-19: qty 1

## 2017-05-19 MED ORDER — HYDROCORTISONE ACETATE 25 MG RE SUPP
25.0000 mg | Freq: Two times a day (BID) | RECTAL | Status: DC
  Administered 2017-05-19: 25 mg via RECTAL
  Filled 2017-05-19: qty 1

## 2017-05-19 MED ORDER — PSEUDOEPHEDRINE HCL 30 MG PO TABS: 30.0000 mg | ORAL_TABLET | Freq: Three times a day (TID) | ORAL | 0 refills | Status: DC | PRN

## 2017-05-19 MED ORDER — ONDANSETRON HCL 4 MG PO TABS: 4.0000 mg | ORAL_TABLET | Freq: Four times a day (QID) | ORAL | 0 refills | Status: DC | PRN

## 2017-05-19 MED ORDER — PSEUDOEPHEDRINE HCL 30 MG PO TABS
30.0000 mg | ORAL_TABLET | Freq: Three times a day (TID) | ORAL | Status: DC | PRN
  Administered 2017-05-19: 30 mg via ORAL
  Filled 2017-05-19 (×3): qty 1

## 2017-05-19 MED ORDER — KETOROLAC TROMETHAMINE 15 MG/ML IJ SOLN
15.0000 mg | Freq: Once | INTRAMUSCULAR | Status: AC
Start: 2017-05-19 — End: 2017-05-19
  Administered 2017-05-19: 15 mg via INTRAVENOUS
  Filled 2017-05-19: qty 1

## 2017-05-19 MED ORDER — HYDROCORTISONE ACETATE 25 MG RE SUPP: 25.0000 mg | Freq: Two times a day (BID) | RECTAL | 0 refills | Status: DC

## 2017-05-19 MED ORDER — METOCLOPRAMIDE HCL 5 MG PO TABS: 5.0000 mg | ORAL_TABLET | Freq: Four times a day (QID) | ORAL | 0 refills | Status: DC | PRN

## 2017-05-19 MED ORDER — POTASSIUM CHLORIDE CRYS ER 20 MEQ PO TBCR
40.0000 meq | EXTENDED_RELEASE_TABLET | Freq: Once | ORAL | Status: AC
  Administered 2017-05-19: 40 meq via ORAL
  Filled 2017-05-19: qty 2

## 2017-05-19 MED ORDER — OXYMETAZOLINE HCL 0.05 % NA SOLN: 1.0000 | Freq: Two times a day (BID) | NASAL | Status: DC

## 2017-05-19 NOTE — Progress Notes (Signed)
[name] to be D/C'd Home per MD order.  Discussed prescriptions and follow up appointments with the patient. Prescriptions given to patient, medication list explained in detail. Pt verbalized understanding.  Allergies as of 05/19/2017   No Known Allergies     Medication List    STOP taking these medications   ciprofloxacin 250 MG tablet Commonly known as:  CIPRO   HYDROcodone-acetaminophen 5-325 MG tablet Commonly known as:  NORCO/VICODIN   ibuprofen 800 MG tablet Commonly known as:  ADVIL,MOTRIN   sucralfate 1 GM/10ML suspension Commonly known as:  CARAFATE     TAKE these medications   aspirin EC 81 MG tablet Take 1 tablet (81 mg total) by mouth daily.   atorvastatin 40 MG tablet Commonly known as:  LIPITOR Take 40 mg by mouth at bedtime.   cephALEXin 500 MG capsule Commonly known as:  KEFLEX Take 1 capsule (500 mg total) by mouth every 12 (twelve) hours.   docusate sodium 100 MG capsule Commonly known as:  COLACE Take 100 mg by mouth 2 (two) times daily as needed for mild constipation.   hydrocortisone 25 MG suppository Commonly known as:  ANUSOL-HC Place 1 suppository (25 mg total) rectally 2 (two) times daily.   hydroxychloroquine 200 MG tablet Commonly known as:  PLAQUENIL Take 200 mg by mouth 2 (two) times daily.   hydrOXYzine 25 MG tablet Commonly known as:  ATARAX/VISTARIL Take 25 mg by mouth every 6 (six) hours as needed for itching.   insulin glargine 100 unit/mL Sopn Commonly known as:  LANTUS Inject 0.06 mLs (6 Units total) into the skin at bedtime.   JANUVIA 100 MG tablet Generic drug:  sitaGLIPtin Take 100 mg by mouth daily.   metFORMIN 500 MG tablet Commonly known as:  GLUCOPHAGE Take 500 mg by mouth 2 (two) times daily.   metoCLOPramide 5 MG tablet Commonly known as:  REGLAN Take 1 tablet (5 mg total) by mouth every 6 (six) hours as needed for nausea. What changed:  when to take this  reasons to take this   omeprazole 20 MG  capsule Commonly known as:  PRILOSEC Take 20 mg by mouth 2 (two) times daily.   ondansetron 4 MG tablet Commonly known as:  ZOFRAN Take 1 tablet (4 mg total) by mouth every 6 (six) hours as needed for nausea.   pseudoephedrine 30 MG tablet Commonly known as:  SUDAFED Take 1 tablet (30 mg total) by mouth every 8 (eight) hours as needed for congestion (sinus headache).   traMADol 50 MG tablet Commonly known as:  ULTRAM Take 50 mg by mouth 2 (two) times daily as needed for pain.            Durable Medical Equipment        Start     Ordered   05/19/17 1240  For home use only DME 3 n 1  Once     05/19/17 1239      Vitals:   05/19/17 0639 05/19/17 0814  BP: 118/75 130/70  Pulse: 98 (!) 105  Resp: 19 19  Temp: 98.4 F (36.9 C) 98.2 F (36.8 C)  SpO2: 100% 100%    Skin clean, dry and intact without evidence of skin break down, no evidence of skin tears noted. IV catheter discontinued intact. Site without signs and symptoms of complications. Dressing and pressure applied. Pt denies pain at this time. No complaints noted.  An After Visit Summary was printed and given to the patient. Patient escorted via [place],  and D/C home via private auto.  Emilio Math, RN [facility] Phone [phone]

## 2017-05-19 NOTE — Progress Notes (Signed)
Pt complaining of headache but describes it as pressure in between the eyes. Rates pain 7/10.   Pt also had small BM this AM-blood streaked.

## 2017-05-19 NOTE — Progress Notes (Signed)
Pt was in bed awake. Patient's friend on-site sitting in a chair. Patient waiting for discharge in the same day. Chaplain provided reflective listening and compassionate presence.  Chaplain Ysidra Sopher.

## 2017-05-19 NOTE — Progress Notes (Signed)
Physical Therapy Treatment Patient Details Name: [name] MRN: [id number] DOB: 1936-02-18 Today's Date: 05/19/2017    History of Present Illness  81 y.o. female with medical history significant of RA, frequent UTIs, uncontrolled DM2 with A1C of 10.1.  Patient has been having ~1 month history of epigastric abd pain, NBNB emesis, decreased PO intake. Dx of UTI    PT Comments     Pt tolerated increased ambulation distance of 250'  today. Distance limited by back pain. She had 2 minor episodes of staggering from which she recovered independently. Pt reports her balance "isn't good" at baseline, and that she feels weak today.  Use of RW at home was recommended, pt agreeable to this.     Follow Up Recommendations  Home health PT     Equipment Recommendations  3in1 (PT)    Recommendations for Other Services       Precautions / Restrictions Precautions Precautions: Fall Restrictions Weight Bearing Restrictions: No    Mobility  Bed Mobility               General bed mobility comments: up in recliner  Transfers Overall transfer level: Modified independent Equipment used: Rolling walker (2 wheeled)                Ambulation/Gait Ambulation/Gait assistance: Min guard Ambulation Distance (Feet): 250 Feet Assistive device: None Gait Pattern/deviations: Step-through pattern;Decreased stride length;Staggering right   Gait velocity interpretation: at or above normal speed for age/gender General Gait Details: pt with decreased stride, 2 periods of sway to R with pt able to recover without physical assist. Pt limited by back pain. Encouraged pt to use her RW at home until she feels her strength returns.   Stairs            Wheelchair Mobility    Modified Rankin (Stroke Patients Only)       Balance Overall balance assessment: Needs assistance;History of Falls   Sitting balance-Leahy Scale: Good       Standing balance-Leahy Scale: Good                               Cognition Arousal/Alertness: Awake/alert Behavior During Therapy: WFL for tasks assessed/performed Overall Cognitive Status: Within Functional Limits for tasks assessed                                        Exercises      General Comments        Pertinent Vitals/Pain Pain Score: 8  Pain Location: low back (pt reports this is chronic since back surgery 3 years ago) Pain Descriptors / Indicators: Aching Pain Intervention(s): Limited activity within patient's tolerance;Monitored during session;Premedicated before session    Home Living                      Prior Function            PT Goals (current goals can now be found in the care plan section) Acute Rehab PT Goals Patient Stated Goal: return home PT Goal Formulation: With patient Time For Goal Achievement: 06/01/17 Potential to Achieve Goals: Good Progress towards PT goals: Progressing toward goals    Frequency    Min 3X/week      PT Plan Current plan remains appropriate    Co-evaluation  AM-PAC PT "6 Clicks" Daily Activity  Outcome Measure  Difficulty turning over in bed (including adjusting bedclothes, sheets and blankets)?: None Difficulty moving from lying on back to sitting on the side of the bed? : A Little Difficulty sitting down on and standing up from a chair with arms (e.g., wheelchair, bedside commode, etc,.)?: None Help needed moving to and from a bed to chair (including a wheelchair)?: None Help needed walking in hospital room?: A Little Help needed climbing 3-5 steps with a railing? : A Little 6 Click Score: 21    End of Session Equipment Utilized During Treatment: Gait belt Activity Tolerance: Patient tolerated treatment well Patient left: in chair;with chair alarm set;with call bell/phone within reach Nurse Communication: Mobility status PT Visit Diagnosis: Difficulty in walking, not elsewhere classified  (R26.2);History of falling (Z91.81)     Time: 7169-6789 PT Time Calculation (min) (ACUTE ONLY): 14 min  Charges:  $Gait Training: 8-22 mins                    G Codes:          Philomena Doheny 05/19/2017, 12:31 PM [phone]

## 2017-05-19 NOTE — Care Management Important Message (Signed)
Important Message  Patient Details  Name: [name] MRN: [id number] Date of Birth: 02/18/1936   Medicare Important Message Given:  Yes    Tylena Prisk [place] 05/19/2017, 11:00 AM

## 2017-05-19 NOTE — Care Management Note (Signed)
Case Management Note  Patient Details  Name: [name] MRN: [id number] Date of Birth: Aug 06, 1935  Subjective/Objective:       CM following for progression and d/c planning.             Action/Plan: 05/19/2017 Met with pt re [place] and DME needs. 3:1 ordered per pt request and PT recommendation. HHPT per [facility] per pt request and PT recommendation. Spoke with [facility], Linus Mako re [facility] order and [facility] re 3:1.    Expected Discharge Date:                  Expected Discharge Plan:  [place]  In-House Referral:  NA  Discharge planning Services  CM Consult  Post Acute Care Choice:  Durable Medical Equipment, Home Health Choice offered to:  Patient  DME Arranged:  3-N-1 DME Agency:  [place]:  PT [place]:  [facility] (now [facility])  Status of Service:  Completed, signed off  If discussed at [place] of Stay Meetings, dates discussed:    Additional Comments:  Adron Bene, RN 05/19/2017, 12:33 PM

## 2017-05-19 NOTE — Discharge Summary (Signed)
Physician Discharge Summary  [name] JKD:[id number] DOB: [birth year] DOA: 05/15/2017  PCP: Nolene Ebbs, MD  Admit date: 05/15/2017 Discharge date: 05/19/2017   Recommendations for Outpatient Follow-Up:   Home health Cbc 1 week -follow up on hemorrhoids    Discharge Diagnosis:   Principal Problem:   Intractable nausea and vomiting Active Problems:   Rheumatoid arthritis (HCC)   Tachycardia with heart rate 100-120 beats per minute   DM (diabetes mellitus) ([place])   E-coli UTI   Discharge disposition:  Home  Discharge Condition: Improved.  Diet recommendation: Low sodium, heart healthy.  Carbohydrate-modified.  Wound care: None.   History of Present Illness:   HPI on 05/15/2017 by Dr. Denzil Magnuson a 81 y.o.femalewith medical history significant of RA, frequent UTIs, uncontrolled DM2 with A1C of 10.1. Patient has been having ~1 month history of epigastric abd pain, NBNB emesis, decreased PO intake. Thinks last BM was about a week ago. CT abd / pelvis done 10 days ago showed no acute abnormalities. Was started on PO reglan. She has also had 3 day history of urinary urgency / frequency (although she didn't have this 10 days ago). UA was positive 10 days ago for E.Coli UTI, she confirms she was started on cipro after cultures came back (I guess someone was able to get a-hold of her), looks like this was started around 05/08/17 and has been taking that (although I wonder how much shes kept down with the N/V).   Hospital Course by Problem:   Intractable nausea/vomiting/diarrhea -Has been ongoing for approximately one month -Patient had abdominal CT done on 05/05/2017 which showed fluid filled cecum -Continue Reglan- increase dose and antiemetics  -tolerating diet -stool studies not needed as diarrhea has resolved  Escherichia coli urinary tract infection -Patient was started on ciprofloxacin on 05/08/2017 -Urine culture showing ecoli  still -treat with PO keflex upon d/c  Rheumatoid arthritis -Continue Plaquenil  Diabetes mellitus, type II -resume home meds  GERD -Continue PPI  Hyperlipidemia -Continue statin  Hypokalemia/hypomagnesemia -replaced IV -replaced magensium as well     Medical Consultants:    None.   Discharge Exam:   Vitals:   05/19/17 0639 05/19/17 0814  BP: 118/75 130/70  Pulse: 98 (!) 105  Resp: 19 19  Temp: 98.4 F (36.9 C) 98.2 F (36.8 C)  SpO2: 100% 100%   Vitals:   05/18/17 1658 05/18/17 2035 05/19/17 0639 05/19/17 0814  BP: (!) 122/57 137/62 118/75 130/70  Pulse: 100 (!) 110 98 (!) 105  Resp: 16 18 19 19   Temp: 98.4 F (36.9 C) 98.4 F (36.9 C) 98.4 F (36.9 C) 98.2 F (36.8 C)  TempSrc: Oral Oral Oral Oral  SpO2: 100% 100% 100% 100%  Weight:      Height:        Gen:  NAD    The results of significant diagnostics from this hospitalization (including imaging, microbiology, ancillary and laboratory) are listed below for reference.     Procedures and Diagnostic Studies:   Dg Chest 2 View  Result Date: 05/15/2017 CLINICAL DATA:  Chest pain for several days EXAM: CHEST  2 VIEW COMPARISON:  05/05/2017 FINDINGS: Cardiac shadow is stable. The lungs are clear bilaterally. Degenerative changes about shoulder joints are noted. Changes of prior vertebral augmentation are again seen and stable. No new focal abnormality is noted. IMPRESSION: No active cardiopulmonary disease. Electronically Signed   By: Inez Catalina M.D.   On: 05/15/2017 15:59   Dg Abd 1 View  Result Date: 05/15/2017 CLINICAL DATA:  Nausea and vomiting times 2-3 weeks EXAM: ABDOMEN - 1 VIEW COMPARISON:  CT 05/05/2017 FINDINGS: The bowel gas pattern is normal. No radiopaque calculi. Stable phleboliths in the pelvis with injection granulomata overlying the right gluteal region. Thoracolumbar spondylosis with vertebral augmentation noted at T11 and T12. Mild bilateral joint space narrowing of the  hips. The bony pelvis appears intact. IMPRESSION: Unremarkable bowel gas pattern. Thoracolumbar spondylosis with vertebral augmentation at T11 and T12. Electronically Signed   By: Ashley Royalty M.D.   On: 05/15/2017 21:28     Labs:   Basic Metabolic Panel:  Recent Labs Lab 05/15/17 1529 05/16/17 0355 05/17/17 0257 05/17/17 1616 05/19/17 0430  NA 136 137 138  --  138  K 2.9* 2.9* 2.5* 3.5 3.3*  CL 95* 102 104  --  106  CO2 27 24 23   --  22  GLUCOSE 97 88 81  --  82  BUN 12 11 6   --  <5*  CREATININE 0.77 0.66 0.62  --  0.59  CALCIUM 8.8* 7.9* 7.9*  --  8.3*  MG  --  1.4* 1.7  --   --    GFR Estimated Creatinine Clearance: 47 mL/min (by C-G formula based on SCr of 0.59 mg/dL). Liver Function Tests:  Recent Labs Lab 05/15/17 1529  AST 42*  ALT 19  ALKPHOS 51  BILITOT 1.1  PROT 6.3*  ALBUMIN 3.2*   No results for input(s): LIPASE, AMYLASE in the last 168 hours. No results for input(s): AMMONIA in the last 168 hours. Coagulation profile No results for input(s): INR, PROTIME in the last 168 hours.  CBC:  Recent Labs Lab 05/15/17 1640 05/16/17 0355 05/17/17 0257 05/19/17 0430  WBC 12.9* 10.2 10.0 6.6  HGB 14.1 12.6 12.1 10.8*  HCT 42.5 37.5 34.9* 32.4*  MCV 84.8 85.2 85.3 85.9  PLT 355 341 291 250   Cardiac Enzymes: No results for input(s): CKTOTAL, CKMB, CKMBINDEX, TROPONINI in the last 168 hours. BNP: Invalid input(s): POCBNP CBG:  Recent Labs Lab 05/18/17 2106 05/19/17 0013 05/19/17 0453 05/19/17 0756 05/19/17 1232  GLUCAP 117* 117* 91 108* 88   D-Dimer No results for input(s): DDIMER in the last 72 hours. Hgb A1c No results for input(s): HGBA1C in the last 72 hours. Lipid Profile No results for input(s): CHOL, HDL, LDLCALC, TRIG, CHOLHDL, LDLDIRECT in the last 72 hours. Thyroid function studies No results for input(s): TSH, T4TOTAL, T3FREE, THYROIDAB in the last 72 hours.  Invalid input(s): FREET3 Anemia work up No results for input(s):  VITAMINB12, FOLATE, FERRITIN, TIBC, IRON, RETICCTPCT in the last 72 hours. Microbiology Recent Results (from the past 240 hour(s))  Culture, blood (routine x 2)     Status: None (Preliminary result)   Collection Time: 05/15/17  4:40 PM  Result Value Ref Range Status   Specimen Description BLOOD LEFT ANTECUBITAL  Final   Special Requests   Final    BOTTLES DRAWN AEROBIC AND ANAEROBIC Blood Culture adequate volume   Culture NO GROWTH 4 DAYS  Final   Report Status PENDING  Incomplete  Urine culture     Status: Abnormal   Collection Time: 05/15/17  7:26 PM  Result Value Ref Range Status   Specimen Description URINE, CATHETERIZED  Final   Special Requests NONE  Final   Culture >=100,000 COLONIES/mL ESCHERICHIA COLI (A)  Final   Report Status 05/18/2017 FINAL  Final   Organism ID, Bacteria ESCHERICHIA COLI (A)  Final  Susceptibility   Escherichia coli - MIC*    AMPICILLIN >=32 RESISTANT Resistant     CEFAZOLIN 16 SENSITIVE Sensitive     CEFTRIAXONE <=1 SENSITIVE Sensitive     CIPROFLOXACIN <=0.25 SENSITIVE Sensitive     GENTAMICIN <=1 SENSITIVE Sensitive     IMIPENEM <=0.25 SENSITIVE Sensitive     NITROFURANTOIN <=16 SENSITIVE Sensitive     TRIMETH/SULFA <=20 SENSITIVE Sensitive     AMPICILLIN/SULBACTAM >=32 RESISTANT Resistant     PIP/TAZO <=4 SENSITIVE Sensitive     Extended ESBL NEGATIVE Sensitive     * >=100,000 COLONIES/mL ESCHERICHIA COLI  Culture, blood (routine x 2)     Status: None (Preliminary result)   Collection Time: 05/15/17  9:43 PM  Result Value Ref Range Status   Specimen Description BLOOD RIGHT WRIST  Final   Special Requests IN PEDIATRIC BOTTLE Blood Culture adequate volume  Final   Culture NO GROWTH 4 DAYS  Final   Report Status PENDING  Incomplete     Discharge Instructions:   Discharge Instructions    Diet Carb Modified    Complete by:  As directed    Discharge instructions    Complete by:  As directed    Cbc 1 week Bowel regimen for "toothpaste"  consistency stool   Increase activity slowly    Complete by:  As directed      Allergies as of 05/19/2017   No Known Allergies     Medication List    STOP taking these medications   ciprofloxacin 250 MG tablet Commonly known as:  CIPRO   HYDROcodone-acetaminophen 5-325 MG tablet Commonly known as:  NORCO/VICODIN   ibuprofen 800 MG tablet Commonly known as:  ADVIL,MOTRIN   sucralfate 1 GM/10ML suspension Commonly known as:  CARAFATE     TAKE these medications   aspirin EC 81 MG tablet Take 1 tablet (81 mg total) by mouth daily.   atorvastatin 40 MG tablet Commonly known as:  LIPITOR Take 40 mg by mouth at bedtime.   cephALEXin 500 MG capsule Commonly known as:  KEFLEX Take 1 capsule (500 mg total) by mouth every 12 (twelve) hours.   docusate sodium 100 MG capsule Commonly known as:  COLACE Take 100 mg by mouth 2 (two) times daily as needed for mild constipation.   hydrocortisone 25 MG suppository Commonly known as:  ANUSOL-HC Place 1 suppository (25 mg total) rectally 2 (two) times daily.   hydroxychloroquine 200 MG tablet Commonly known as:  PLAQUENIL Take 200 mg by mouth 2 (two) times daily.   hydrOXYzine 25 MG tablet Commonly known as:  ATARAX/VISTARIL Take 25 mg by mouth every 6 (six) hours as needed for itching.   insulin glargine 100 unit/mL Sopn Commonly known as:  LANTUS Inject 0.06 mLs (6 Units total) into the skin at bedtime.   JANUVIA 100 MG tablet Generic drug:  sitaGLIPtin Take 100 mg by mouth daily.   metFORMIN 500 MG tablet Commonly known as:  GLUCOPHAGE Take 500 mg by mouth 2 (two) times daily.   metoCLOPramide 5 MG tablet Commonly known as:  REGLAN Take 1 tablet (5 mg total) by mouth every 6 (six) hours as needed for nausea. What changed:  when to take this  reasons to take this   omeprazole 20 MG capsule Commonly known as:  PRILOSEC Take 20 mg by mouth 2 (two) times daily.   ondansetron 4 MG tablet Commonly known as:   ZOFRAN Take 1 tablet (4 mg total) by mouth every 6 (six)  hours as needed for nausea.   pseudoephedrine 30 MG tablet Commonly known as:  SUDAFED Take 1 tablet (30 mg total) by mouth every 8 (eight) hours as needed for congestion (sinus headache).   traMADol 50 MG tablet Commonly known as:  ULTRAM Take 50 mg by mouth 2 (two) times daily as needed for pain.            Durable Medical Equipment        Start     Ordered   05/19/17 1240  For home use only DME 3 n 1  Once     05/19/17 1239     Follow-up Information    Nolene Ebbs, MD Follow up in 1 week(s).   Specialty:  Internal Medicine Why:  CBC/BMP 1 week Contact information: [address] [place] [address] [phone]            Time coordinating discharge: 35 min  Signed:  Wells Gerdeman U Mikylah Ackroyd   [facility] 05/19/2017, 3:30 PM

## 2017-05-20 LAB — CULTURE, BLOOD (ROUTINE X 2)
Culture: NO GROWTH
Culture: NO GROWTH
SPECIAL REQUESTS: ADEQUATE
Special Requests: ADEQUATE

## 2017-06-11 ENCOUNTER — Other Ambulatory Visit: Payer: Self-pay

## 2017-06-11 ENCOUNTER — Encounter (HOSPITAL_COMMUNITY)
Admission: RE | Admit: 2017-06-11 | Discharge: 2017-06-11 | Disposition: A | Discharge status: Home / Self Care | Payer: Medicare Other | Source: Ambulatory Visit | Attending: Oral Surgery | Admitting: Oral Surgery

## 2017-06-11 ENCOUNTER — Encounter (HOSPITAL_COMMUNITY): Payer: Self-pay

## 2017-06-11 DIAGNOSIS — Z79899 Other long term (current) drug therapy: Secondary | ICD-10-CM | POA: Diagnosis not present

## 2017-06-11 DIAGNOSIS — E119 Type 2 diabetes mellitus without complications: Secondary | ICD-10-CM | POA: Diagnosis not present

## 2017-06-11 DIAGNOSIS — K0530 Chronic periodontitis, unspecified: Secondary | ICD-10-CM | POA: Diagnosis not present

## 2017-06-11 DIAGNOSIS — K029 Dental caries, unspecified: Secondary | ICD-10-CM | POA: Diagnosis present

## 2017-06-11 DIAGNOSIS — E785 Hyperlipidemia, unspecified: Secondary | ICD-10-CM | POA: Diagnosis not present

## 2017-06-11 DIAGNOSIS — M2601 Maxillary hyperplasia: Secondary | ICD-10-CM | POA: Diagnosis not present

## 2017-06-11 DIAGNOSIS — Z794 Long term (current) use of insulin: Secondary | ICD-10-CM | POA: Diagnosis not present

## 2017-06-11 DIAGNOSIS — I10 Essential (primary) hypertension: Secondary | ICD-10-CM | POA: Diagnosis not present

## 2017-06-11 DIAGNOSIS — Z87891 Personal history of nicotine dependence: Secondary | ICD-10-CM | POA: Diagnosis not present

## 2017-06-11 DIAGNOSIS — K219 Gastro-esophageal reflux disease without esophagitis: Secondary | ICD-10-CM | POA: Diagnosis not present

## 2017-06-11 DIAGNOSIS — M1990 Unspecified osteoarthritis, unspecified site: Secondary | ICD-10-CM | POA: Diagnosis not present

## 2017-06-11 DIAGNOSIS — Z7982 Long term (current) use of aspirin: Secondary | ICD-10-CM | POA: Diagnosis not present

## 2017-06-11 DIAGNOSIS — G4733 Obstructive sleep apnea (adult) (pediatric): Secondary | ICD-10-CM | POA: Diagnosis not present

## 2017-06-11 HISTORY — DX: Cardiac arrhythmia, unspecified: I49.9

## 2017-06-11 LAB — BASIC METABOLIC PANEL
ANION GAP: 13 (ref 5–15)
BUN: 9 mg/dL (ref 6–20)
CHLORIDE: 98 mmol/L — AB (ref 101–111)
CO2: 30 mmol/L (ref 22–32)
Calcium: 9.2 mg/dL (ref 8.9–10.3)
Creatinine, Ser: 1.07 mg/dL — ABNORMAL HIGH (ref 0.44–1.00)
GFR calc non Af Amer: 48 mL/min — ABNORMAL LOW (ref >60)
GFR, EST AFRICAN AMERICAN: 56 mL/min — AB (ref >60)
Glucose, Bld: 117 mg/dL — ABNORMAL HIGH (ref 65–99)
POTASSIUM: 3 mmol/L — AB (ref 3.5–5.1)
Sodium: 141 mmol/L (ref 135–145)

## 2017-06-11 LAB — CBC
HEMATOCRIT: 38.3 % (ref 36.0–46.0)
Hemoglobin: 12.5 g/dL (ref 12.0–15.0)
MCH: 29.3 pg (ref 26.0–34.0)
MCHC: 32.6 g/dL (ref 30.0–36.0)
MCV: 89.9 fL (ref 78.0–100.0)
Platelets: 270 10*3/uL (ref 150–400)
RBC: 4.26 MIL/uL (ref 3.87–5.11)
RDW: 17.2 % — AB (ref 11.5–15.5)
WBC: 10.4 10*3/uL (ref 4.0–10.5)

## 2017-06-11 LAB — HEMOGLOBIN A1C
Hgb A1c MFr Bld: 6 % — ABNORMAL HIGH (ref 4.8–5.6)
MEAN PLASMA GLUCOSE: 125.5 mg/dL

## 2017-06-11 LAB — GLUCOSE, CAPILLARY: GLUCOSE-CAPILLARY: 110 mg/dL — AB (ref 65–99)

## 2017-06-11 NOTE — Pre-Procedure Instructions (Addendum)
Deryn Massengale  06/11/2017      [id number], [place] [address] Phone: [phone] Fax: [phone]    Your procedure is scheduled on 06/13/2017.  Report to [facility] Admitting at 07:45 A.M.  Call this number if you have problems the morning of surgery:  [phone]   Remember:  Do not eat food or drink liquids after midnight.  Take these medicines the morning of surgery with A SIP OF WATER: Hydroxychloroquine (Plaquenil) Methylprednisolone (MedroL) - if needed Omeprazole (Prilosec) Tramadol (Ultram) - if needed   7 days prior to surgery STOP taking any Aspirin, Aleve, Naproxen, Ibuprofen, Motrin, Advil, Goody's, BC's, all herbal medications, fish oil, and all vitamins  WHAT DO I DO ABOUT MY DIABETES MEDICATION?   Marland Kitchen Do not take oral diabetes medicines (pills) the morning of surgery. - DO NOT TAKE YOUR METFORMIN OR JANUVIA the morning of surgery      How to Manage Your Diabetes Before and After Surgery  Why is it important to control my blood sugar before and after surgery? . Improving blood sugar levels before and after surgery helps healing and can limit problems. . A way of improving blood sugar control is eating a healthy diet by: o  Eating less sugar and carbohydrates o  Increasing activity/exercise o  Talking with your doctor about reaching your blood sugar goals . High blood sugars (greater than 180 mg/dL) can raise your risk of infections and slow your recovery, so you will need to focus on controlling your diabetes during the weeks before surgery. . Make sure that the doctor who takes care of your diabetes knows about your planned surgery including the date and location.  How do I manage my blood sugar before surgery? . Check your blood sugar at least 4 times a day, starting 2 days before surgery, to make sure that the level is not too high or low. o Check your  blood sugar the morning of your surgery when you wake up and every 2 hours until you get to the Short Stay unit. . If your blood sugar is less than 70 mg/dL, you will need to treat for low blood sugar: o Do not take insulin. o Treat a low blood sugar (less than 70 mg/dL) with  cup of clear juice (cranberry or apple), 4 glucose tablets, OR glucose gel. Recheck blood sugar in 15 minutes after treatment (to make sure it is greater than 70 mg/dL). If your blood sugar is not greater than 70 mg/dL on recheck, call [phone] o  for further instructions. . Report your blood sugar to the short stay nurse when you get to Short Stay.  . If you are admitted to the hospital after surgery: o Your blood sugar will be checked by the staff and you will probably be given insulin after surgery (instead of oral diabetes medicines) to make sure you have good blood sugar levels. o The goal for blood sugar control after surgery is 80-180 mg/dL.     Do not wear jewelry, make-up or nail polish.  Do not wear lotions, powders, or perfumes, or deoderant.  Do not shave 48 hours prior to surgery.  Men may shave face and neck.  Do not bring valuables to the hospital.  [facility] is not responsible for any belongings or valuables.  Contacts, eyeglasses, dentures or bridgework may not be worn into surgery.  Leave your suitcase in the car.  After surgery it may be brought to your room.  For patients admitted to the hospital, discharge time will be determined by your treatment team.  Patients discharged the day of surgery will not be allowed to drive home.   Name and phone number of your driver:    Special instructions:   [facility]- Preparing For Surgery  Before surgery, you can play an important role. Because skin is not sterile, your skin needs to be as free of germs as possible. You can reduce the number of germs on your skin by washing with CHG (chlorahexidine gluconate) Soap before surgery.  CHG is an  antiseptic cleaner which kills germs and bonds with the skin to continue killing germs even after washing.  Please do not use if you have an allergy to CHG or antibacterial soaps. If your skin becomes reddened/irritated stop using the CHG.  Do not shave (including legs and underarms) for at least 48 hours prior to first CHG shower. It is OK to shave your face.  Please follow these instructions carefully.   1. Shower the NIGHT BEFORE SURGERY and the MORNING OF SURGERY with CHG.   2. If you chose to wash your hair, wash your hair first as usual with your normal shampoo.  3. After you shampoo, rinse your hair and body thoroughly to remove the shampoo.  4. Use CHG as you would any other liquid soap. You can apply CHG directly to the skin and wash gently with a scrungie or a clean washcloth.   5. Apply the CHG Soap to your body ONLY FROM THE NECK DOWN.  Do not use on open wounds or open sores. Avoid contact with your eyes, ears, mouth and genitals (private parts). Wash Face and genitals (private parts)  with your normal soap.  6. Wash thoroughly, paying special attention to the area where your surgery will be performed.  7. Thoroughly rinse your body with warm water from the neck down.  8. DO NOT shower/wash with your normal soap after using and rinsing off the CHG Soap.  9. Pat yourself dry with a CLEAN TOWEL.  10. Wear CLEAN PAJAMAS to bed the night before surgery, wear comfortable clothes the morning of surgery  11. Place CLEAN SHEETS on your bed the night of your first shower and DO NOT SLEEP WITH PETS.    Day of Surgery: Shower as stated above. Do not apply any deodorants/lotions. Please wear clean clothes to the hospital/surgery center.      Please read over the following fact sheets that you were given.

## 2017-06-11 NOTE — Progress Notes (Addendum)
PCP - Dr. Nolene Ebbs  Cardiologist - patient denies  Chest x-ray - 05/15/2017 EKG - 05/15/2017; 06/11/2017 Stress Test - patient denies ECHO - 01/09/2017 Cardiac Cath - patient denies  Sleep Study - patient states it was last year but not sure where; not in epic. CPAP - yes  Fasting Blood Sugar - 150's Checks Blood Sugar 1 time a day  Blood Thinner Instructions: Aspirin Instructions: patient states she was not instructed to stop ASA, will stop today until surgery   Patient denies shortness of breath, fever, cough and chest pain at PAT appointment   Patient verbalized understanding of instructions that were given to them at the PAT appointment. Patient was also instructed that they will need to review over the PAT instructions again at home before surgery.  Reviewed vital signs with patient.  She stated her HR is always up.  She states that she sometimes feels like her heart is racing but that she has never had it evaluated.  Anesthesia to review.

## 2017-06-11 NOTE — H&P (Signed)
HISTORY AND PHYSICAL  [name] is a 81 y.o. female patient  Referred by general dentist for full mouth extractions and tori removal for denture fabrication.  No diagnosis found.  Past Medical History:  Diagnosis Date  . Anemia    "when I was young"  . Anginal pain ([place])   . Arthritis    "all over my body"   . Chronic bronchitis ([place])    "get it just about q yr" (06/14/2015)  . Chronic lower back pain   . Exertional shortness of breath   . Frequent UTI    "recently" (06/14/2015)  . GERD (gastroesophageal reflux disease)   . Headache(784.0)    "weekly; wake up w/them " (110/19/2018)  . Heart murmur   . High cholesterol   . History of blood transfusion ~ 1954   "related to my periods"  . Hypertension   . Migraine    "once/month maybe" (05/16/2017)  . OSA (obstructive sleep apnea)    "I think I'm using a BiPAP" (06/14/2015)  . Type II diabetes mellitus ([place])     No current facility-administered medications for this encounter.    Current Outpatient Medications  Medication Sig Dispense Refill  . aspirin EC 81 MG tablet Take 1 tablet (81 mg total) by mouth daily.    Marland Kitchen atorvastatin (LIPITOR) 40 MG tablet Take 40 mg by mouth at bedtime.  0  . cephALEXin (KEFLEX) 500 MG capsule Take 1 capsule (500 mg total) by mouth every 12 (twelve) hours. 4 capsule 0  . docusate sodium (COLACE) 100 MG capsule Take 100 mg by mouth 2 (two) times daily as needed for mild constipation.    . hydrocortisone (ANUSOL-HC) 25 MG suppository Place 1 suppository (25 mg total) rectally 2 (two) times daily. 12 suppository 0  . hydroxychloroquine (PLAQUENIL) 200 MG tablet Take 200 mg by mouth 2 (two) times daily.  0  . hydrOXYzine (ATARAX/VISTARIL) 25 MG tablet Take 25 mg by mouth every 6 (six) hours as needed for itching.    . insulin glargine (LANTUS) 100 unit/mL SOPN Inject 0.06 mLs (6 Units total) into the skin at bedtime. 15 mL 11  . JANUVIA 100 MG tablet Take 100 mg by mouth daily.   0  .  metFORMIN (GLUCOPHAGE) 500 MG tablet Take 500 mg by mouth 2 (two) times daily.  0  . metoCLOPramide (REGLAN) 5 MG tablet Take 1 tablet (5 mg total) by mouth every 6 (six) hours as needed for nausea. 15 tablet 0  . omeprazole (PRILOSEC) 20 MG capsule Take 20 mg by mouth 2 (two) times daily.  0  . ondansetron (ZOFRAN) 4 MG tablet Take 1 tablet (4 mg total) by mouth every 6 (six) hours as needed for nausea. 20 tablet 0  . pseudoephedrine (SUDAFED) 30 MG tablet Take 1 tablet (30 mg total) by mouth every 8 (eight) hours as needed for congestion (sinus headache). 30 tablet 0  . traMADol (ULTRAM) 50 MG tablet Take 50 mg by mouth 2 (two) times daily as needed for pain.      No Known Allergies Active Problems:   * No active hospital problems. *  Vitals: There were no vitals taken for this visit. Lab results:No results found for this or any previous visit (from the past 46 hour(s)). Radiology Results: No results found. General appearance: alert and cooperative Head: Normocephalic, without obvious abnormality, atraumatic Eyes: negative Nose: Nares normal. Septum midline. Mucosa normal. No drainage or sinus tenderness. Throat: multiple carious teeth, bilateral mandibular lingual tori,  maxillary palatal torus, hyperplastic maxillary tuberosity Neck: no adenopathy, supple, symmetrical, trachea midline and thyroid not enlarged, symmetric, no tenderness/mass/nodules Resp: clear to auscultation bilaterally Cardio: regular rate and rhythm, S1, S2 normal, no murmur, click, rub or gallop  Assessment: Nonrestorable teeth secondary to dental caries, periodontitis. Mandibular and maxillary tori. Hyperplastic tuberosity.  Plan: Full dental extractions with alveoloplasty. Removal tori, reduction tuberosity. GA. Nasal. Day surgery.  Gae Bon 06/11/2017

## 2017-06-12 MED ORDER — CEFAZOLIN SODIUM-DEXTROSE 2-4 GM/100ML-% IV SOLN
2.0000 g | INTRAVENOUS | Status: AC
  Administered 2017-06-13: 2 g via INTRAVENOUS
  Filled 2017-06-12: qty 100

## 2017-06-12 NOTE — Progress Notes (Signed)
Anesthesia Chart Review:  Pt is a 81 year old female scheduled for multiple extraction with alveoloplasty on 06/13/2017 with Diona Browner, DDS  - PCP is Nolene Ebbs, MD  PMH includes:  Dysrhythmia (unspecified), HTN, DM, hyperlipidemia, heart murmur, OSA. Former smoker. BMI 19  Medications include: ASA 81mg , lipitor, hydroxychloroquine, januvia, metformin, methylprednisone, prilosec  BP (!) 145/68   Pulse 100   Temp 36.6 C   Resp 18   Ht 5\' 4"  (1.626 m)   Wt 111 lb 1.6 oz (50.4 kg)   SpO2 99%   BMI 19.07 kg/m   Preoperative labs reviewed.  HbA1c 6.0, glucose 117  CXR 05/15/17: No active cardiopulmonary disease.  EKG 06/11/17: NSR  Echo 01/09/17:  - Left ventricle: The cavity size was normal. Wall thickness was increased in a pattern of moderate LVH. Systolic function was vigorous. The estimated ejection fraction was in the range of 65% to 70%. Wall motion was normal; there were no regional wall motion abnormalities. Doppler parameters are consistent with abnormal left ventricular relaxation (grade 1 diastolic dysfunction). - Mitral valve: Calcified annulus. Mildly thickened leaflets . - Atrial septum: No defect or patent foramen ovale was identified.  If no changes, I anticipate pt can proceed with surgery as scheduled.   Willeen Cass, FNP-BC [facility] Short Stay Surgical Center/Anesthesiology Phone: [phone] 06/12/2017 2:12 PM

## 2017-06-13 ENCOUNTER — Ambulatory Visit (HOSPITAL_COMMUNITY): Payer: Medicare Other | Admitting: Certified Registered Nurse Anesthetist

## 2017-06-13 ENCOUNTER — Encounter (HOSPITAL_COMMUNITY): Payer: Self-pay | Admitting: *Deleted

## 2017-06-13 ENCOUNTER — Encounter (HOSPITAL_COMMUNITY)
Admission: RE | Disposition: A | Discharge status: Home / Self Care | Payer: Self-pay | Source: Ambulatory Visit | Attending: Oral Surgery

## 2017-06-13 ENCOUNTER — Ambulatory Visit (HOSPITAL_COMMUNITY): Payer: Medicare Other | Admitting: Emergency Medicine

## 2017-06-13 ENCOUNTER — Ambulatory Visit (HOSPITAL_COMMUNITY)
Admission: RE | Admit: 2017-06-13 | Discharge: 2017-06-13 | Disposition: A | Discharge status: Home / Self Care | Payer: Medicare Other | Source: Ambulatory Visit | Attending: Oral Surgery | Admitting: Oral Surgery

## 2017-06-13 DIAGNOSIS — M2601 Maxillary hyperplasia: Secondary | ICD-10-CM | POA: Insufficient documentation

## 2017-06-13 DIAGNOSIS — I10 Essential (primary) hypertension: Secondary | ICD-10-CM | POA: Insufficient documentation

## 2017-06-13 DIAGNOSIS — K0530 Chronic periodontitis, unspecified: Secondary | ICD-10-CM | POA: Diagnosis not present

## 2017-06-13 DIAGNOSIS — K219 Gastro-esophageal reflux disease without esophagitis: Secondary | ICD-10-CM | POA: Insufficient documentation

## 2017-06-13 DIAGNOSIS — K029 Dental caries, unspecified: Secondary | ICD-10-CM | POA: Diagnosis not present

## 2017-06-13 DIAGNOSIS — Z7982 Long term (current) use of aspirin: Secondary | ICD-10-CM | POA: Insufficient documentation

## 2017-06-13 DIAGNOSIS — Z79899 Other long term (current) drug therapy: Secondary | ICD-10-CM | POA: Insufficient documentation

## 2017-06-13 DIAGNOSIS — E785 Hyperlipidemia, unspecified: Secondary | ICD-10-CM | POA: Insufficient documentation

## 2017-06-13 DIAGNOSIS — E119 Type 2 diabetes mellitus without complications: Secondary | ICD-10-CM | POA: Insufficient documentation

## 2017-06-13 DIAGNOSIS — G4733 Obstructive sleep apnea (adult) (pediatric): Secondary | ICD-10-CM | POA: Insufficient documentation

## 2017-06-13 DIAGNOSIS — M1990 Unspecified osteoarthritis, unspecified site: Secondary | ICD-10-CM | POA: Diagnosis not present

## 2017-06-13 DIAGNOSIS — Z794 Long term (current) use of insulin: Secondary | ICD-10-CM | POA: Insufficient documentation

## 2017-06-13 DIAGNOSIS — Z87891 Personal history of nicotine dependence: Secondary | ICD-10-CM | POA: Insufficient documentation

## 2017-06-13 HISTORY — PX: MULTIPLE EXTRACTIONS WITH ALVEOLOPLASTY: SHX5342

## 2017-06-13 LAB — GLUCOSE, CAPILLARY
GLUCOSE-CAPILLARY: 107 mg/dL — AB (ref 65–99)
Glucose-Capillary: 135 mg/dL — ABNORMAL HIGH (ref 65–99)

## 2017-06-13 SURGERY — MULTIPLE EXTRACTION WITH ALVEOLOPLASTY
Anesthesia: General | Laterality: Bilateral

## 2017-06-13 MED ORDER — SUCCINYLCHOLINE CHLORIDE 20 MG/ML IJ SOLN
INTRAMUSCULAR | Status: DC | PRN
  Administered 2017-06-13: 60 mg via INTRAVENOUS

## 2017-06-13 MED ORDER — 0.9 % SODIUM CHLORIDE (POUR BTL) OPTIME
TOPICAL | Status: DC | PRN
  Administered 2017-06-13: 1000 mL

## 2017-06-13 MED ORDER — OXYMETAZOLINE HCL 0.05 % NA SOLN
NASAL | Status: AC
  Filled 2017-06-13: qty 15

## 2017-06-13 MED ORDER — LIDOCAINE HCL (CARDIAC) 20 MG/ML IV SOLN
INTRAVENOUS | Status: DC | PRN
  Administered 2017-06-13: 60 mg via INTRAVENOUS

## 2017-06-13 MED ORDER — OXYMETAZOLINE HCL 0.05 % NA SOLN
NASAL | Status: DC | PRN
  Administered 2017-06-13: 2

## 2017-06-13 MED ORDER — ALBUMIN HUMAN 5 % IV SOLN
INTRAVENOUS | Status: DC | PRN
  Administered 2017-06-13: 09:00:00 via INTRAVENOUS

## 2017-06-13 MED ORDER — PROPOFOL 10 MG/ML IV BOLUS
INTRAVENOUS | Status: AC
  Filled 2017-06-13: qty 20

## 2017-06-13 MED ORDER — FENTANYL CITRATE (PF) 250 MCG/5ML IJ SOLN
INTRAMUSCULAR | Status: AC
  Filled 2017-06-13: qty 5

## 2017-06-13 MED ORDER — FENTANYL CITRATE (PF) 100 MCG/2ML IJ SOLN: 25.0000 ug | INTRAMUSCULAR | Status: DC | PRN

## 2017-06-13 MED ORDER — PROPOFOL 10 MG/ML IV BOLUS
INTRAVENOUS | Status: DC | PRN
  Administered 2017-06-13: 120 mg via INTRAVENOUS

## 2017-06-13 MED ORDER — ONDANSETRON HCL 4 MG/2ML IJ SOLN
INTRAMUSCULAR | Status: DC | PRN
  Administered 2017-06-13: 4 mg via INTRAVENOUS

## 2017-06-13 MED ORDER — EPHEDRINE SULFATE 50 MG/ML IJ SOLN
INTRAMUSCULAR | Status: DC | PRN
  Administered 2017-06-13 (×4): 10 mg via INTRAVENOUS

## 2017-06-13 MED ORDER — FENTANYL CITRATE (PF) 100 MCG/2ML IJ SOLN
INTRAMUSCULAR | Status: DC | PRN
  Administered 2017-06-13: 50 ug via INTRAVENOUS
  Administered 2017-06-13 (×2): 100 ug via INTRAVENOUS

## 2017-06-13 MED ORDER — DEXAMETHASONE SODIUM PHOSPHATE 4 MG/ML IJ SOLN
INTRAMUSCULAR | Status: DC | PRN
  Administered 2017-06-13: 10 mg via INTRAVENOUS

## 2017-06-13 MED ORDER — LACTATED RINGERS IV SOLN
INTRAVENOUS | Status: DC
  Administered 2017-06-13 (×2): via INTRAVENOUS

## 2017-06-13 MED ORDER — LIDOCAINE-EPINEPHRINE 2 %-1:100000 IJ SOLN
INTRAMUSCULAR | Status: AC
  Filled 2017-06-13: qty 1

## 2017-06-13 MED ORDER — OXYCODONE-ACETAMINOPHEN 5-325 MG PO TABS: 1.0000 | ORAL_TABLET | ORAL | 0 refills | Status: DC | PRN

## 2017-06-13 MED ORDER — SODIUM CHLORIDE 0.9 % IR SOLN
Status: DC | PRN
  Administered 2017-06-13: 1000 mL

## 2017-06-13 MED ORDER — AMOXICILLIN 500 MG PO CAPS: 500.0000 mg | ORAL_CAPSULE | Freq: Two times a day (BID) | ORAL | 0 refills | Status: DC

## 2017-06-13 MED ORDER — OXYCODONE HCL 5 MG/5ML PO SOLN: 5.0000 mg | Freq: Once | ORAL | Status: DC | PRN

## 2017-06-13 MED ORDER — PHENYLEPHRINE HCL 10 MG/ML IJ SOLN
INTRAMUSCULAR | Status: DC | PRN
  Administered 2017-06-13 (×3): 80 ug via INTRAVENOUS

## 2017-06-13 MED ORDER — LIDOCAINE-EPINEPHRINE 2 %-1:100000 IJ SOLN
INTRAMUSCULAR | Status: DC | PRN
  Administered 2017-06-13: 18 mL via INTRADERMAL

## 2017-06-13 MED ORDER — OXYCODONE HCL 5 MG PO TABS: 5.0000 mg | ORAL_TABLET | Freq: Once | ORAL | Status: DC | PRN

## 2017-06-13 SURGICAL SUPPLY — 33 items
BLADE 15 SAFETY STRL DISP (BLADE) ×3 IMPLANT
BUR CROSS CUT FISSURE 1.6 (BURR) ×2 IMPLANT
BUR CROSS CUT FISSURE 1.6MM (BURR) ×1
BUR EGG ELITE 4.0 (BURR) ×2 IMPLANT
BUR EGG ELITE 4.0MM (BURR) ×1
CANISTER SUCT 3000ML PPV (MISCELLANEOUS) ×3 IMPLANT
COVER SURGICAL LIGHT HANDLE (MISCELLANEOUS) ×3 IMPLANT
CRADLE DONUT ADULT HEAD (MISCELLANEOUS) ×3 IMPLANT
DECANTER SPIKE VIAL GLASS SM (MISCELLANEOUS) ×3 IMPLANT
DRAPE U-SHAPE 76X120 STRL (DRAPES) ×3 IMPLANT
FLUID NSS /IRRIG 1000 ML XXX (MISCELLANEOUS) ×3 IMPLANT
GAUZE PACKING FOLDED 2  STR (GAUZE/BANDAGES/DRESSINGS) ×2
GAUZE PACKING FOLDED 2 STR (GAUZE/BANDAGES/DRESSINGS) ×1 IMPLANT
GLOVE BIO SURGEON STRL SZ 6.5 (GLOVE) ×2 IMPLANT
GLOVE BIO SURGEON STRL SZ7.5 (GLOVE) ×3 IMPLANT
GLOVE BIO SURGEONS STRL SZ 6.5 (GLOVE) ×1
GLOVE BIOGEL PI IND STRL 7.0 (GLOVE) IMPLANT
GLOVE BIOGEL PI INDICATOR 7.0 (GLOVE)
GOWN STRL REUS W/ TWL LRG LVL3 (GOWN DISPOSABLE) ×1 IMPLANT
GOWN STRL REUS W/ TWL XL LVL3 (GOWN DISPOSABLE) ×1 IMPLANT
GOWN STRL REUS W/TWL LRG LVL3 (GOWN DISPOSABLE) ×3
GOWN STRL REUS W/TWL XL LVL3 (GOWN DISPOSABLE) ×3
KIT BASIN OR (CUSTOM PROCEDURE TRAY) ×3 IMPLANT
KIT ROOM TURNOVER OR (KITS) ×3 IMPLANT
NEEDLE 22X1 1/2 (OR ONLY) (NEEDLE) ×6 IMPLANT
NEEDLE 27GAX1X1/2 (NEEDLE) IMPLANT
NS IRRIG 1000ML POUR BTL (IV SOLUTION) ×3 IMPLANT
PAD ARMBOARD 7.5X6 YLW CONV (MISCELLANEOUS) ×3 IMPLANT
SUT CHROMIC 3 0 PS 2 (SUTURE) ×6 IMPLANT
SYR CONTROL 10ML LL (SYRINGE) ×6 IMPLANT
TRAY ENT MC OR (CUSTOM PROCEDURE TRAY) ×3 IMPLANT
TUBING IRRIGATION (MISCELLANEOUS) ×3 IMPLANT
YANKAUER SUCT BULB TIP NO VENT (SUCTIONS) ×3 IMPLANT

## 2017-06-13 NOTE — Anesthesia Postprocedure Evaluation (Signed)
Anesthesia Post Note  Patient: [name]  Procedure(s) Performed: MULTIPLE EXTRACTION WITH ALVEOLOPLASTY (Bilateral )     Patient location during evaluation: PACU Anesthesia Type: General Level of consciousness: awake and alert Pain management: pain level controlled Vital Signs Assessment: post-procedure vital signs reviewed and stable Respiratory status: spontaneous breathing, nonlabored ventilation, respiratory function stable and patient connected to nasal cannula oxygen Cardiovascular status: blood pressure returned to baseline and stable Postop Assessment: no apparent nausea or vomiting Anesthetic complications: no    Last Vitals:  Vitals:   06/13/17 1015 06/13/17 1028  BP: (!) 153/68 (!) 146/79  Pulse: (!) 114   Resp: 20   Temp: (!) 36.1 C   SpO2: 97%     Last Pain:  Vitals:   06/13/17 1014  TempSrc:   PainSc: 0-No pain                 Liannah Yarbough

## 2017-06-13 NOTE — Transfer of Care (Signed)
Immediate Anesthesia Transfer of Care Note  Patient: [name]  Procedure(s) Performed: MULTIPLE EXTRACTION WITH ALVEOLOPLASTY (Bilateral )  Patient Location: PACU  Anesthesia Type:General  Level of Consciousness: awake and alert   Airway & Oxygen Therapy: Patient Spontanous Breathing and Patient connected to face mask oxygen  Post-op Assessment: Report given to RN and Post -op Vital signs reviewed and stable  Post vital signs: Reviewed and stable  Last Vitals:  Vitals:   06/13/17 0729 06/13/17 1000  BP: (!) 186/84   Pulse: (!) 103   Resp: 20   Temp: 36.5 C (!) (P) 36.3 C  SpO2: 99%     Last Pain:  Vitals:   06/13/17 0729  TempSrc: Oral         Complications: No apparent anesthesia complications

## 2017-06-13 NOTE — H&P (Signed)
H&P documentation  -History and Physical Reviewed  -Patient has been re-examined  -No change in the plan of care  [name]  

## 2017-06-13 NOTE — Op Note (Signed)
06/13/2017  9:47 AM  PATIENT:  [name]  81 y.o. female  PRE-OPERATIVE DIAGNOSIS:  NON RESTORABLE TEETH # 1, 2, 11, 12, 13, 15, 19, 22, 23, 24, 25, 26, 27, 29, BILATERAL MANDIBULAR LINGUAL TORI,  HYPERPLASTIC LEFT MAXILLARY TUBEROSITY, PALATAL TORUS  POST-OPERATIVE DIAGNOSIS:  SAME + HYPERPLASTIC RIGHT MAXILLARY TUBEROSITY  PROCEDURE:  Procedure(s): MULTIPLE EXTRACTION TEETH # 1, 2, 11, 12, 13, 15, 19, 22, 23, 24, 25, 26, 27, 29,  ALVEOLOPLASTY,  rEMOVAL BILATERAL MANDIBULAR LINGUAL TORI,  HYPERPLASTIC RIGHT & LEFT MAXILLARY TUBEROSITY, PALATAL TORUS  SURGEON:  Surgeon(s): Diona Browner, DDS  ANESTHESIA:   local and general  EBL:  minimal  DRAINS: none   SPECIMEN:  No Specimen  COUNTS:  YES  PLAN OF CARE: Discharge to home after PACU  PATIENT DISPOSITION:  PACU - hemodynamically stable.   PROCEDURE DETAILS: Dictation #  [id number]  Gae Bon, DMD 06/13/2017 9:47 AM

## 2017-06-13 NOTE — Anesthesia Preprocedure Evaluation (Signed)
Anesthesia Evaluation    Airway Mallampati: II  TM Distance: >3 FB Neck ROM: Full    Dental  (+) Missing, Poor Dentition   Pulmonary shortness of breath, sleep apnea , COPD, former smoker,    breath sounds clear to auscultation       Cardiovascular hypertension, Pt. on medications (-) angina+CHF  (-) dysrhythmias  Rhythm:Regular     Neuro/Psych  Headaches, neg Seizures negative psych ROS   GI/Hepatic Neg liver ROS, GERD  Medicated and Controlled,  Endo/Other  diabetes, Type 2, Insulin Dependent  Renal/GU CRFRenal disease     Musculoskeletal  (+) Arthritis ,   Abdominal   Peds  Hematology negative hematology ROS (+)   Anesthesia Other Findings PMH includes:  Dysrhythmia (unspecified), HTN, DM, hyperlipidemia, heart murmur, OSA. Former smoker. BMI 19  Medications include: ASA 81mg , lipitor, hydroxychloroquine, januvia, metformin, methylprednisone, prilosec  BP (!) 145/68   Pulse 100   Temp 36.6 C   Resp 18   Ht 5\' 4"  (1.626 m)   Wt 111 lb 1.6 oz (50.4 kg)   SpO2 99%   BMI 19.07 kg/m   Preoperative labs reviewed.  HbA1c 6.0, glucose 117  CXR 05/15/17: No active cardiopulmonary disease.  EKG 06/11/17: NSR  Echo 01/09/17:  - Left ventricle: The cavity size was normal. Wall thickness wasincreased in a pattern of moderate LVH. Systolic function wasvigorous. The estimated ejection fraction was in the range of 65%to 70%. Wall motion was normal; there were no regional wallmotion abnormalities. Doppler parameters are consistent withabnormal left ventricular relaxation (grade 1 diastolic dysfunction). - Mitral valve: Calcified annulus. Mildly thickened leaflets . - Atrial septum: No defect or patent foramen ovale was identified.    Reproductive/Obstetrics                             Anesthesia Physical Anesthesia Plan  ASA: II  Anesthesia Plan: General   Post-op Pain  Management:    Induction: Intravenous  PONV Risk Score and Plan: 3 and Ondansetron and Treatment may vary due to age or medical condition  Airway Management Planned: Nasal ETT  Additional Equipment: None  Intra-op Plan:   Post-operative Plan: Extubation in OR  Informed Consent: I have reviewed the patients History and Physical, chart, labs and discussed the procedure including the risks, benefits and alternatives for the proposed anesthesia with the patient or authorized representative who has indicated his/her understanding and acceptance.   Dental advisory given  Plan Discussed with: CRNA and Surgeon  Anesthesia Plan Comments:         Anesthesia Quick Evaluation

## 2017-06-13 NOTE — Anesthesia Procedure Notes (Signed)
Procedure Name: Intubation Date/Time: 06/13/2017 8:54 AM Performed by: Taveon Enyeart T, CRNA Pre-anesthesia Checklist: Patient identified, Emergency Drugs available, Suction available and Patient being monitored Patient Re-evaluated:Patient Re-evaluated prior to induction Oxygen Delivery Method: Circle system utilized Preoxygenation: Pre-oxygenation with 100% oxygen Induction Type: IV induction Ventilation: Mask ventilation without difficulty Laryngoscope Size: Mac and 3 Grade View: Grade I Nasal Tubes: Right, Nasal prep performed, Nasal Rae and Magill forceps- large, utilized Tube size: 7.0 mm Number of attempts: 1 Airway Equipment and Method: Patient positioned with wedge pillow and Stylet Placement Confirmation: ETT inserted through vocal cords under direct vision,  positive ETCO2 and breath sounds checked- equal and bilateral Tube secured with: Tape Dental Injury: Teeth and Oropharynx as per pre-operative assessment

## 2017-06-14 ENCOUNTER — Encounter (HOSPITAL_COMMUNITY): Payer: Self-pay | Admitting: Oral Surgery

## 2017-06-14 NOTE — Op Note (Signed)
[name], [name]              ACCOUNT NO.:  [id number]  MEDICAL RECORD NO.:  [id number]  LOCATION:  SDS                          FACILITY:  [facility]  PHYSICIAN:  Gae Bon, M.D.  DATE OF BIRTH:  05-Jun-1936  DATE OF PROCEDURE:  06/13/2017 DATE OF DISCHARGE:                              OPERATIVE REPORT   PREOPERATIVE DIAGNOSES:  Nonrestorable teeth numbers 1, 2, 11, 12, 13, 15, 19, 22, 23, 24, 25, 26, 27, 29; bilateral mandibular lingual tori; hyperplastic left maxillary tuberosity; palatal torus.  POSTOPERATIVE DIAGNOSES:  Nonrestorable teeth numbers 1, 2, 11, 12, 13, 15, 19, 22, 23, 24, 25, 26, 27, 29; bilateral mandibular lingual tori; hyperplastic left maxillary tuberosity; palatal torus; hyperplastic right maxillary tuberosity.  PROCEDURES:  Extraction of teeth numbers 1, 2, 11, 12, 13, 15, 19, 22, 23, 24, 25, 26, 27, 29; alveoplasty of right and left maxilla and mandible; removal of bilateral mandibular lingual tori; removal of hyperplastic maxillary right and left tuberosity; and reduction of palatal torus.  SURGEON:  Gae Bon, MD.  ANESTHESIA:  General, Moser attending.  Nasal intubation.  DESCRIPTION OF PROCEDURE:  The patient was taken to the operating room and placed on the table in supine position.  General anesthesia was administered intravenously and a nasal endotracheal tube was placed and secured.  The eyes were protected and the patient was draped for surgery.  Time-out was performed.  The posterior pharynx was suctioned. A throat pack was placed.  A 2% lidocaine with 1:100,000 epinephrine was infiltrated in an inferior alveolar block on the right and left sides and infiltration of the anterior mandible and a buccal and palatal infiltration was created around the teeth to be removed as well as along the midportion where the palatal torus was.  A total of 18 mL was utilized.  A bite block was placed on the right side of the mouth and a sweetheart  retractor was used to retract the tongue.  A #15 blade was used to make an incision along the crest of the mandibular ridge approximately 1 cm proximal to tooth #19, carrying forward on the ridge to teeth numbers 22, 23, 24, 25, 26.  Around these anterior teeth, an incision was created in the gingival sulcus both buccal and lingually. The periosteum was reflected and the teeth were elevated with a 301 elevator and removed from the mouth with dental forceps.  The periosteum was further reflected to expose the irregular alveolar ridge and the left lingual torus.  Then, an egg-shaped bur was used to smooth the alveolus and remove the lingual torus.  Then, the bone file was used to further smooth the bone.  Then, the areas were irrigated and closed with 3-0 chromic.  Then, a 15 blade was used to make an incision in the area of tooth #15, carried distally to tooth numbers 13, 12, and 11.  Then, the periosteum was reflected and the teeth were elevated and removed from the mouth with a dental forceps.  Sockets were curetted.  Then, the bone was reflected to expose the bony prominence of the left hyperplastic tuberosity.  This was reduced with rongeurs and egg-shaped bur and then further smoothed with  bone file.  Then, the area was irrigated and closed with 3-0 chromic.  The bite block and sweetheart retractor were repositioned to the other side of the mouth and a 15 blade was used to make an incision around teeth numbers 27, 29, and carried proximally for 1 cm beyond tooth #29 along the alveolar crest. The periosteum was reflected.  The teeth were elevated and then removed with a dental forceps.  The periosteum was reflected to expose the alveolar crest both buccal and lingually and then the egg-shaped bur and bone file were used to remove bony interferences and irregularities. The area was then irrigated and closed with 3-0 chromic in the maxilla. A 15 blade was used to make an incision around  teeth numbers 1 and 2. It was carried forward proximally and distally approximately 1 cm beyond these 2 teeth.  The periosteum was reflected and then the teeth were removed using a 301 elevator and the upper Universal forceps.  The sockets were curetted.  It was noted that the buccal bone was protruding and would have an undercut if a denture fabricated.  Therefore, this hyperplastic bone was removed with the egg-shaped bur and bone file. Then, the areas were irrigated and closed with 3-0 chromic.  Then, the palatal torus was reduced.  Next, a Y-shaped incision over the midportion of the palatal torus was created with the proximal and distal extension in the midline.  The periosteum was reflected to expose the torus.  Seldin elevators were used to retract the palatal tissue to avoid damage.  Then, the egg-shaped bur was used to reduce the torus.  A bone file was used to further smooth the area.  Then, this incision was closed with 3-0 chromic.  The oral cavity was then inspected and found to have good contour, hemostasis, and closure.  The oral cavity was irrigated and suctioned.  Throat pack was removed.  The patient was left for Anesthesia to extubate, transport to recovery room and plan was for discharge to home through Day surgery.  ESTIMATED BLOOD LOSS:  Minimum.  COMPLICATIONS:  None.  SPECIMENS:  None.     Gae Bon, M.D.     SMJ/MEDQ  D:  06/13/2017  T:  06/14/2017  Job:  [id number]

## 2017-08-23 ENCOUNTER — Emergency Department (HOSPITAL_COMMUNITY)
Admission: EM | Admit: 2017-08-23 | Discharge: 2017-08-23 | Disposition: A | Discharge status: Home / Self Care | Payer: Medicare Other | Attending: Emergency Medicine | Admitting: Emergency Medicine

## 2017-08-23 ENCOUNTER — Encounter (HOSPITAL_COMMUNITY): Payer: Self-pay

## 2017-08-23 ENCOUNTER — Emergency Department (HOSPITAL_COMMUNITY): Payer: Medicare Other

## 2017-08-23 DIAGNOSIS — I110 Hypertensive heart disease with heart failure: Secondary | ICD-10-CM | POA: Insufficient documentation

## 2017-08-23 DIAGNOSIS — Z79899 Other long term (current) drug therapy: Secondary | ICD-10-CM | POA: Diagnosis not present

## 2017-08-23 DIAGNOSIS — I5032 Chronic diastolic (congestive) heart failure: Secondary | ICD-10-CM | POA: Insufficient documentation

## 2017-08-23 DIAGNOSIS — J449 Chronic obstructive pulmonary disease, unspecified: Secondary | ICD-10-CM | POA: Diagnosis not present

## 2017-08-23 DIAGNOSIS — R109 Unspecified abdominal pain: Secondary | ICD-10-CM

## 2017-08-23 DIAGNOSIS — E119 Type 2 diabetes mellitus without complications: Secondary | ICD-10-CM | POA: Insufficient documentation

## 2017-08-23 DIAGNOSIS — Z794 Long term (current) use of insulin: Secondary | ICD-10-CM | POA: Diagnosis not present

## 2017-08-23 DIAGNOSIS — R1013 Epigastric pain: Secondary | ICD-10-CM | POA: Diagnosis not present

## 2017-08-23 DIAGNOSIS — Z87891 Personal history of nicotine dependence: Secondary | ICD-10-CM | POA: Insufficient documentation

## 2017-08-23 DIAGNOSIS — Z7982 Long term (current) use of aspirin: Secondary | ICD-10-CM | POA: Diagnosis not present

## 2017-08-23 LAB — CBC
HEMATOCRIT: 37.2 % (ref 36.0–46.0)
HEMOGLOBIN: 12.5 g/dL (ref 12.0–15.0)
MCH: 30.4 pg (ref 26.0–34.0)
MCHC: 33.6 g/dL (ref 30.0–36.0)
MCV: 90.5 fL (ref 78.0–100.0)
Platelets: 220 10*3/uL (ref 150–400)
RBC: 4.11 MIL/uL (ref 3.87–5.11)
RDW: 14.1 % (ref 11.5–15.5)
WBC: 6.5 10*3/uL (ref 4.0–10.5)

## 2017-08-23 LAB — COMPREHENSIVE METABOLIC PANEL
ALK PHOS: 53 U/L (ref 38–126)
ALT: 16 U/L (ref 14–54)
ANION GAP: 8 (ref 5–15)
AST: 41 U/L (ref 15–41)
Albumin: 2.9 g/dL — ABNORMAL LOW (ref 3.5–5.0)
BILIRUBIN TOTAL: 0.9 mg/dL (ref 0.3–1.2)
BUN: <5 mg/dL — ABNORMAL LOW (ref 6–20)
CALCIUM: 8.1 mg/dL — AB (ref 8.9–10.3)
CO2: 26 mmol/L (ref 22–32)
Chloride: 108 mmol/L (ref 101–111)
Creatinine, Ser: 0.61 mg/dL (ref 0.44–1.00)
GFR calc non Af Amer: >60 mL/min (ref >60)
Glucose, Bld: 87 mg/dL (ref 65–99)
POTASSIUM: 3.6 mmol/L (ref 3.5–5.1)
Sodium: 142 mmol/L (ref 135–145)
Total Protein: 5.6 g/dL — ABNORMAL LOW (ref 6.5–8.1)

## 2017-08-23 LAB — URINALYSIS, ROUTINE W REFLEX MICROSCOPIC
BILIRUBIN URINE: NEGATIVE
Glucose, UA: NEGATIVE mg/dL
HGB URINE DIPSTICK: NEGATIVE
KETONES UR: NEGATIVE mg/dL
NITRITE: POSITIVE — AB
PH: 7 (ref 5.0–8.0)
Protein, ur: NEGATIVE mg/dL
SPECIFIC GRAVITY, URINE: 1.004 — AB (ref 1.005–1.030)

## 2017-08-23 LAB — LIPASE, BLOOD: LIPASE: 74 U/L — AB (ref 11–51)

## 2017-08-23 MED ORDER — SUCRALFATE 1 G PO TABS: 1.0000 g | ORAL_TABLET | Freq: Four times a day (QID) | ORAL | 0 refills | Status: DC

## 2017-08-23 MED ORDER — IOPAMIDOL (ISOVUE-300) INJECTION 61%
INTRAVENOUS | Status: AC
  Administered 2017-08-23: 100 mL
  Filled 2017-08-23: qty 100

## 2017-08-23 MED ORDER — SODIUM CHLORIDE 0.9 % IV SOLN
INTRAVENOUS | Status: DC
  Administered 2017-08-23: 08:00:00 via INTRAVENOUS

## 2017-08-23 MED ORDER — DIPHENHYDRAMINE HCL 50 MG/ML IJ SOLN
12.5000 mg | Freq: Once | INTRAMUSCULAR | Status: AC
  Administered 2017-08-23: 12.5 mg via INTRAVENOUS
  Filled 2017-08-23: qty 1

## 2017-08-23 MED ORDER — DICYCLOMINE HCL 20 MG PO TABS: 20.0000 mg | ORAL_TABLET | Freq: Two times a day (BID) | ORAL | 0 refills | Status: DC

## 2017-08-23 MED ORDER — SODIUM CHLORIDE 0.9 % IV BOLUS (SEPSIS)
1000.0000 mL | Freq: Once | INTRAVENOUS | Status: AC
  Administered 2017-08-23: 1000 mL via INTRAVENOUS

## 2017-08-23 MED ORDER — METOCLOPRAMIDE HCL 5 MG/ML IJ SOLN
5.0000 mg | Freq: Once | INTRAMUSCULAR | Status: AC
  Administered 2017-08-23: 5 mg via INTRAVENOUS
  Filled 2017-08-23: qty 2

## 2017-08-23 NOTE — ED Notes (Signed)
Bed: WA09 Expected date:  Expected time:  Means of arrival:  Comments: 82 yo F  abd pain, N/V/D

## 2017-08-23 NOTE — ED Triage Notes (Signed)
Patient coming from home with c/o abdominal pain for the past week and N/V started last night. Pt blood sugar was 74.

## 2017-08-23 NOTE — ED Provider Notes (Signed)
[facility] DEPT Provider Note   CSN: [id number] Arrival date & time: 08/23/17  0709     History   Chief Complaint No chief complaint on file.   HPI [name] is a 82 y.o. female.  82 y/o female here w/ abd pain x 6 months Pain is sharp and worse with eating Has non bilious/bloody emesis but no diarrhea Nothing makes it better Has had weight loss of > 50 lbs No fever or urinary sx Has been using GERD meds w/o improvement Saw her pcp 2 days ago and placed on more acid blockers      Past Medical History:  Diagnosis Date  . Anemia    "when I was young"  . Anginal pain ([place])   . Arthritis    "all over my body"   . Chronic bronchitis ([place])    "get it just about q yr" (06/14/2015)  . Chronic lower back pain   . Dysrhythmia    patient unsure what it is but has been told that.  . Exertional shortness of breath   . Frequent UTI    "recently" (06/14/2015)  . GERD (gastroesophageal reflux disease)   . Headache(784.0)    "weekly; wake up w/them " (110/19/2018)  . Heart murmur   . High cholesterol   . History of blood transfusion ~ 1954   "related to my periods"  . Hypertension   . Migraine    "once/month maybe" (05/16/2017)  . OSA (obstructive sleep apnea)    "I think I'm using a BiPAP" (06/14/2015)  . Type II diabetes mellitus [facility])     Patient Active Problem List   Diagnosis Date Noted  . Intractable nausea and vomiting 05/15/2017  . Sepsis secondary to UTI ([place])   . Uncontrolled type 2 diabetes mellitus with complication ([place])   . Acute renal failure ([place])   . Chronic diastolic CHF (congestive heart failure) ([place])   . E-coli UTI 01/09/2017  . Sepsis ([place]) 01/09/2017  . Constipation 01/09/2017  . COPD (chronic obstructive pulmonary disease) ([place]) 01/09/2017  . Multiple falls 06/14/2015  . Weight loss 06/14/2015  . Acute respiratory failure with hypoxia ([place]) 06/14/2015  . Orthostasis 06/14/2015  . Acute respiratory  failure ([place]) 06/14/2015  . Elevated lactic acid level 01/22/2015  . 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 Chronic diastolic heart failure, NYHA class 1 ([place]) 01/22/2015  . Tachycardia with heart rate 100-120 beats per minute 04/08/2014  . DM (diabetes mellitus) ([place]) 04/08/2014  . Chest pain 04/07/2014  . Hyperglycemia 12/11/2012  . Syncope and collapse 12/11/2012  . Dehydration, moderate 12/11/2012  . Noncompliance 12/11/2012  . UTI (lower urinary tract infection) 12/11/2012  . Chronic steroid use 12/11/2012  . Rheumatoid arthritis ([place]) 12/11/2012    Past Surgical History:  Procedure Laterality Date  . APPENDECTOMY  1970   Archie Endo 12/12/2010  . BACK SURGERY    . [place]   Archie Endo 12/12/2010  . FRACTURE SURGERY    . LAPAROSCOPIC CHOLECYSTECTOMY  1990's  . LUMBAR MICRODISCECTOMY Left 09/2006   L5-S1/notes 12/12/2010  . MULTIPLE EXTRACTIONS WITH ALVEOLOPLASTY Bilateral 06/13/2017   Procedure: MULTIPLE EXTRACTION WITH ALVEOLOPLASTY;  Surgeon: Diona Browner, DDS;  Location: [facility];  Service: Oral Surgery;  Laterality: Bilateral;  . MULTIPLE TOOTH EXTRACTIONS    . ORIF SHOULDER FRACTURE Left ~ 2009   "put cement down in it"   . TONSILLECTOMY  1950's?  . TOTAL ABDOMINAL HYSTERECTOMY  1979   Archie Endo 12/12/2010  OB History    No data available       Home Medications    Prior to Admission medications   Medication Sig Start Date End Date Taking? Authorizing Provider  aspirin EC 81 MG tablet Take 1 tablet (81 mg total) by mouth daily. 04/09/14   Debbe Odea, MD  atorvastatin (LIPITOR) 20 MG tablet Take 20 mg at bedtime by mouth.  12/19/16   [provider]  docusate sodium (COLACE) 100 MG capsule Take 100 mg by mouth 2 (two) times daily as needed for mild constipation.    [provider]  hydrocortisone (ANUSOL-HC) 25 MG suppository Place 1 suppository (25 mg total) rectally 2 (two) times daily. Patient not taking: Reported on 06/11/2017  05/19/17   Geradine Girt, DO  hydroxychloroquine (PLAQUENIL) 200 MG tablet Take 200 mg by mouth 2 (two) times daily. 05/15/15   [provider]  insulin glargine (LANTUS) 100 unit/mL SOPN Inject 0.06 mLs (6 Units total) into the skin at bedtime. Patient not taking: Reported on 06/11/2017 01/11/17   Allie Bossier, MD  JANUVIA 100 MG tablet Take 100 mg by mouth daily.  01/11/15   [provider]  metFORMIN (GLUCOPHAGE) 500 MG tablet Take 500 mg by mouth 2 (two) times daily. 02/13/17   [provider]  methylPREDNISolone (MEDROL) 4 MG tablet Take 2-4 mg daily by mouth.    [provider]  metoCLOPramide (REGLAN) 5 MG tablet Take 1 tablet (5 mg total) by mouth every 6 (six) hours as needed for nausea. Patient not taking: Reported on 06/11/2017 05/19/17   Geradine Girt, DO  omeprazole (PRILOSEC) 20 MG capsule Take 20 mg by mouth 2 (two) times daily. 12/17/16   [provider]  ondansetron (ZOFRAN) 4 MG tablet Take 1 tablet (4 mg total) by mouth every 6 (six) hours as needed for nausea. Patient not taking: Reported on 06/11/2017 05/19/17   Geradine Girt, DO  oxyCODONE-acetaminophen (PERCOCET) 5-325 MG tablet Take 1 tablet every 4 (four) hours as needed by mouth for severe pain. 06/13/17   Diona Browner, DDS  pseudoephedrine (SUDAFED) 30 MG tablet Take 1 tablet (30 mg total) by mouth every 8 (eight) hours as needed for congestion (sinus headache). Patient not taking: Reported on 06/11/2017 05/19/17   Geradine Girt, DO  traMADol (ULTRAM) 50 MG tablet Take 50 mg daily as needed by mouth for moderate pain.  05/04/12   Barbara Cower, MD    Family History Family History  Problem Relation Age of Onset  . Hypertension Mother   . Heart attack Father   . Diabetes Mellitus II Sister   . Hypertension Sister   . Cancer - Other Sister     Social History Social History   Tobacco Use  . Smoking status: Former Smoker    Types: Cigarettes  . Smokeless  tobacco: Never Used  . Tobacco comment: 05/16/2017"1 pack of cigarettes would last 3-4 months; haven't had any cigarettes for 40 years"  Substance Use Topics  . Alcohol use: Yes    Comment: 05/16/2017 "used to drink beer in the 1990's; don't drink at all now"  . Drug use: No     Allergies   Patient has no known allergies.   Review of Systems Review of Systems  All other systems reviewed and are negative.    Physical Exam Updated Vital Signs BP (!) 163/106 (BP Location: Right Arm)   Pulse (!) 107   Temp 97.7 F (36.5 C) (Oral)  Resp 18   Ht 1.626 m (5\' 4" )   Wt 45.8 kg (101 lb)   SpO2 98%   BMI 17.34 kg/m   Physical Exam  Constitutional: She is oriented to person, place, and time. She appears well-developed and well-nourished.  Non-toxic appearance. No distress.  HENT:  Head: Normocephalic and atraumatic.  Eyes: Conjunctivae, EOM and lids are normal. Pupils are equal, round, and reactive to light.  Neck: Normal range of motion. Neck supple. No tracheal deviation present. No thyroid mass present.  Cardiovascular: Normal rate, regular rhythm and normal heart sounds. Exam reveals no gallop.  No murmur heard. Pulmonary/Chest: Effort normal and breath sounds normal. No stridor. No respiratory distress. She has no decreased breath sounds. She has no wheezes. She has no rhonchi. She has no rales.  Abdominal: Soft. Normal appearance and bowel sounds are normal. She exhibits no distension. There is tenderness in the epigastric area. There is no rigidity, no rebound and no CVA tenderness.    Musculoskeletal: Normal range of motion. She exhibits no edema or tenderness.  Neurological: She is alert and oriented to person, place, and time. She has normal strength. No cranial nerve deficit or sensory deficit. GCS eye subscore is 4. GCS verbal subscore is 5. GCS motor subscore is 6.  Skin: Skin is warm and dry. No abrasion and no rash noted.  Psychiatric: She has a normal mood and  affect. Her speech is normal and behavior is normal.  Nursing note and vitals reviewed.    ED Treatments / Results  Labs (all labs ordered are listed, but only abnormal results are displayed) Labs Reviewed  CBC  LIPASE, BLOOD  COMPREHENSIVE METABOLIC PANEL  URINALYSIS, ROUTINE W REFLEX MICROSCOPIC    EKG  EKG Interpretation None       Radiology No results found.  Procedures Procedures (including critical care time)  Medications Ordered in ED Medications  sodium chloride 0.9 % bolus 1,000 mL (not administered)  0.9 %  sodium chloride infusion (not administered)     Initial Impression / Assessment and Plan / ED Course  I have reviewed the triage vital signs and the nursing notes.  Pertinent labs & imaging results that were available during my care of the patient were reviewed by me and considered in my medical decision making (see chart for details).    Abdominal CT without acute findings..  Patient has no urinary symptoms at this time.  Urine will be sent for culture..  Abdominal exam is benign.  Will refer to GI  Final Clinical Impressions(s) / ED Diagnoses   Final diagnoses:  None    ED Discharge Orders    None       Lacretia Leigh, MD 08/23/17 1317

## 2017-08-23 NOTE — ED Notes (Signed)
Pt has been made aware of UA sample. Will hit call light when able to use restroom.

## 2017-08-23 NOTE — ED Notes (Signed)
Lab call and phlebotomy call to recollect green tube in room 9. They agree to recollect labs.

## 2017-08-25 ENCOUNTER — Encounter: Payer: Self-pay | Admitting: Physician Assistant

## 2017-09-01 ENCOUNTER — Telehealth: Payer: Self-pay | Admitting: *Deleted

## 2017-09-01 NOTE — Telephone Encounter (Signed)
Called the patient's niece Gaylan Gerold at [phone]. Lm for her to please call me. I did advise on my message that we were able to find the colonoscopy report in our old system. However, there is a discrepincy in the spelling of the first name. I asked the niece to please call me.

## 2017-09-02 NOTE — Telephone Encounter (Signed)
Gaylan Gerold the patient's niece said that we have her name correct in Epic. Her name is [name]. Her father was with her in 2006 when she had her colonoscopy and he said her name fast and they combined it as [place].  I asked that when she comes on Monday 2-11 to bring her Social Security number so we can verify the colonoscopy is this patient's and we can scan it.

## 2017-09-08 ENCOUNTER — Ambulatory Visit (INDEPENDENT_AMBULATORY_CARE_PROVIDER_SITE_OTHER): Payer: Medicare Other | Admitting: Physician Assistant

## 2017-09-08 ENCOUNTER — Encounter: Payer: Self-pay | Admitting: Physician Assistant

## 2017-09-08 VITALS — BP 130/80 | HR 74 | Ht 63.5 in | Wt 109.0 lb

## 2017-09-08 DIAGNOSIS — R1310 Dysphagia, unspecified: Secondary | ICD-10-CM

## 2017-09-08 DIAGNOSIS — R110 Nausea: Secondary | ICD-10-CM | POA: Diagnosis not present

## 2017-09-08 DIAGNOSIS — Z1211 Encounter for screening for malignant neoplasm of colon: Secondary | ICD-10-CM | POA: Diagnosis not present

## 2017-09-08 DIAGNOSIS — R634 Abnormal weight loss: Secondary | ICD-10-CM

## 2017-09-08 DIAGNOSIS — R1013 Epigastric pain: Secondary | ICD-10-CM

## 2017-09-08 DIAGNOSIS — Z86010 Personal history of colonic polyps: Secondary | ICD-10-CM

## 2017-09-08 MED ORDER — PEG-KCL-NACL-NASULF-NA ASC-C 140 G PO SOLR: 1.0000 | Freq: Once | ORAL | 0 refills | Status: AC

## 2017-09-08 NOTE — Progress Notes (Signed)
Subjective:    Patient ID: [name], female    DOB: 1936-07-08, 82 y.o.   MRN: [id number]  HPI [name] is a pleasant 82 year old African-American female, new to GI today referred by Dr. Jake Shark [place] after recent visit. Her primary physician is Dr.Avbuere. Patient has history of hypertension, obstructive sleep apnea, adult-onset diabetes mellitus, congestive heart failure, rheumatoid arthritis, GERD, she status post hysterectomy appendectomy and cholecystectomy. She did have colonoscopy done in 2006 per Dr. Vladimir Faster. with 2 polyps removed which were tubular adenomas. She comes in today stating that she has been having recurrent symptoms over the past year. She describes epigastric/subxiphoid discomfort, ongoing heartburn indigestion and what sounds more like solid food dysphagia with episodes of regurgitation occurring very frequently. She says usually she can swallow liquids okay but solid foods will frequently come right back up. She has lost about 80 pounds over the past year. She has had some nausea and describes very frequent gagging rather than vomiting. No real abdominal pain, bowel movements tend to stay constipated there's been no melena or hematochezia. She says she used to take NSAIDs on a regular basis but stopped several months ago. Recent eval in the emergency room on 08/23/2017 with CT of the abdomen and pelvis with contrast unremarkable. She had stable mild dilation of the common bile duct and pancreatic duct postcholecystectomy. CBC and chemistries unremarkable lipase mildly elevated at 74. Family history is negative for colon cancer and polyps as far she is aware Patient does mention that she is very lactose intolerant. She is currently on omeprazole 20 mg twice daily, and has been taking metoclopramide 5 mg every 6 hours on a when necessary basis for nausea. She had recently also been on Carafate and Bentyl both of which were stopped recently  Review of Systems  Pertinent positive and negative review of systems were noted in the above HPI section.  All other review of systems was otherwise negative.  Outpatient Encounter Medications as of 09/08/2017  Medication Sig  . albuterol (ACCUNEB) 0.63 MG/3ML nebulizer solution Take 2 ampules by nebulization every 6 (six) hours as needed for wheezing.  Marland Kitchen aspirin EC 81 MG tablet Take 1 tablet (81 mg total) by mouth daily.  Marland Kitchen atorvastatin (LIPITOR) 40 MG tablet Take 40 mg by mouth at bedtime.  . budesonide-formoterol (SYMBICORT) 160-4.5 MCG/ACT inhaler Inhale 2 puffs into the lungs 2 (two) times daily.  . cetirizine (ZYRTEC) 10 MG tablet Take 10 mg by mouth daily as needed for allergies.  Marland Kitchen docusate sodium (COLACE) 100 MG capsule Take 100 mg by mouth 2 (two) times daily as needed for mild constipation.  . hydroxychloroquine (PLAQUENIL) 200 MG tablet Take 200 mg by mouth 2 (two) times daily.  . methotrexate (RHEUMATREX) 2.5 MG tablet Take 2.5 mg by mouth once a week. Caution:Chemotherapy. Protect from light.  . methylPREDNISolone (MEDROL) 4 MG tablet Take 2 mg by mouth daily.   . metoCLOPramide (REGLAN) 5 MG tablet Take 1 tablet (5 mg total) by mouth every 6 (six) hours as needed for nausea.  Marland Kitchen omeprazole (PRILOSEC) 20 MG capsule Take 20 mg by mouth 2 (two) times daily.  . traMADol (ULTRAM) 50 MG tablet Take 50 mg daily as needed by mouth for moderate pain.   Marland Kitchen PEG-KCl-NaCl-NaSulf-Na Asc-C (PLENVU) 140 g SOLR Take 1 kit by mouth once for 1 dose.  . [DISCONTINUED] amitriptyline (ELAVIL) 25 MG tablet Take 25 mg by mouth at bedtime.  . [DISCONTINUED] atorvastatin (LIPITOR) 20 MG tablet Take 20  mg at bedtime by mouth.   . [DISCONTINUED] dicyclomine (BENTYL) 20 MG tablet Take 1 tablet (20 mg total) by mouth 2 (two) times daily. (Patient not taking: Reported on 09/08/2017)  . [DISCONTINUED] hydrocortisone (ANUSOL-HC) 25 MG suppository Place 1 suppository (25 mg total) rectally 2 (two) times daily. (Patient not taking:  Reported on 09/08/2017)  . [DISCONTINUED] insulin glargine (LANTUS) 100 unit/mL SOPN Inject 0.06 mLs (6 Units total) into the skin at bedtime. (Patient not taking: Reported on 09/08/2017)  . [DISCONTINUED] JANUVIA 100 MG tablet Take 100 mg by mouth daily.   . [DISCONTINUED] metFORMIN (GLUCOPHAGE) 500 MG tablet Take 500 mg by mouth 2 (two) times daily.  . [DISCONTINUED] nitrofurantoin, macrocrystal-monohydrate, (MACROBID) 100 MG capsule Take 100 mg by mouth 2 (two) times daily.  . [DISCONTINUED] ondansetron (ZOFRAN) 4 MG tablet Take 1 tablet (4 mg total) by mouth every 6 (six) hours as needed for nausea. (Patient not taking: Reported on 09/08/2017)  . [DISCONTINUED] oxyCODONE-acetaminophen (PERCOCET) 5-325 MG tablet Take 1 tablet every 4 (four) hours as needed by mouth for severe pain. (Patient not taking: Reported on 09/08/2017)  . [DISCONTINUED] pseudoephedrine (SUDAFED) 30 MG tablet Take 1 tablet (30 mg total) by mouth every 8 (eight) hours as needed for congestion (sinus headache). (Patient not taking: Reported on 09/08/2017)  . [DISCONTINUED] sucralfate (CARAFATE) 1 g tablet Take 1 tablet (1 g total) by mouth 4 (four) times daily. (Patient not taking: Reported on 09/08/2017)   No facility-administered encounter medications on file as of 09/08/2017.    No Known Allergies Patient Active Problem List   Diagnosis Date Noted  . Intractable nausea and vomiting 05/15/2017  . Sepsis secondary to UTI ([place])   . Uncontrolled type 2 diabetes mellitus with complication ([place])   . Acute renal failure ([place])   . Chronic diastolic CHF (congestive heart failure) ([place])   . E-coli UTI 01/09/2017  . Sepsis ([place]) 01/09/2017  . Constipation 01/09/2017  . COPD (chronic obstructive pulmonary disease) ([place]) 01/09/2017  . Multiple falls 06/14/2015  . Weight loss 06/14/2015  . Acute respiratory failure with hypoxia ([place]) 06/14/2015  . Orthostasis 06/14/2015  . Acute respiratory failure ([place]) 06/14/2015  . Elevated  lactic acid level 01/22/2015  . 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 Chronic diastolic heart failure, NYHA class 1 ([place]) 01/22/2015  . Tachycardia with heart rate 100-120 beats per minute 04/08/2014  . DM (diabetes mellitus) ([place]) 04/08/2014  . Chest pain 04/07/2014  . Hyperglycemia 12/11/2012  . Syncope and collapse 12/11/2012  . Dehydration, moderate 12/11/2012  . Noncompliance 12/11/2012  . UTI (lower urinary tract infection) 12/11/2012  . Chronic steroid use 12/11/2012  . Rheumatoid arthritis ([place]) 12/11/2012   Social History   Socioeconomic History  . Marital status: Widowed    Spouse name: Not on file  . Number of children: Not on file  . Years of education: Not on file  . Highest education level: Not on file  Social Needs  . Financial resource strain: Not on file  . Food insecurity - worry: Not on file  . Food insecurity - inability: Not on file  . Transportation needs - medical: Not on file  . Transportation needs - non-medical: Not on file  Occupational History  . Not on file  Tobacco Use  . Smoking status: Former Smoker    Types: Cigarettes  . Smokeless tobacco: Never Used  . Tobacco comment: 05/16/2017"1 pack of cigarettes would last 3-4 months; haven't had any cigarettes for 40 years"  Substance and Sexual  Activity  . Alcohol use: Yes    Comment: 05/16/2017 "used to drink beer in the 1990's; don't drink at all now"  . Drug use: No  . Sexual activity: No    Birth control/protection: Abstinence  Other Topics Concern  . Not on file  Social History Narrative  . Not on file    [name]'s family history includes Cancer - Other in her sister; Diabetes Mellitus II in her sister; Heart attack in her father; Hypertension in her mother and sister.      Objective:    Vitals:   09/08/17 0834  BP: 130/80  Pulse: 74    Physical Exam  ; well-developed elderly African-American female in no acute distress, pleasant blood pressure 130/80 pulse 74, height 5  foot 3, weight 109, BMI 19.0. HEENT; nontraumatic normocephalic EOMI PERRLA sclera anicteric, Cardiovascular; regular rate and rhythm with S1-S2 no murmur rub or gallop, Pulmonary ;clear bilaterally, Abdomen ;soft, nontender nondistended no palpable mass or hepatosplenomegaly bowel sounds are present, Rectal; exam not done, Extremities ;no clubbing cyanosis or edema skin warm and dry, she is very thin, Neuropsych; mood and affect appropriate       Assessment & Plan:   #44 82 year old African-American female with 1 year history of weight loss of about 80 pounds, complaints of epigastric and subxiphoid discomfort and fairly immediate regurgitation of food after swallowing. I suspect she has issues with dysphagia as primary problem. Rule out esophageal motility disorder, rule out stricture or esophageal ring, rule out malignancy. Also consider diabetic gastroparesis with frequent postprandial vomiting #2 rheumatoid arthritis #3 history of adenomatous colon polyps-last colonoscopy 2006 over due for follow-up #4 hypertension #5 adult-onset diabetes mellitus  #6 congestive heart failure EF 65% #7 Status post hysterectomy ,appendectomy ,and cholecystectomy  Plan; Continue omeprazole 20 mg by mouth twice daily for now Full liquid to mechanical soft diet-avoid lactose Patient will be scheduled for upper endoscopy with possible dilation and colonoscopy with Dr. Havery Moros. Both procedures were discussed in detail with the patient including indications risks and benefits and she is agreeable to proceed. If EGD is unremarkable she will need gastric emptying scan.  Amy S Esterwood PA-C 09/08/2017   Cc: Nolene Ebbs, MD

## 2017-09-08 NOTE — Patient Instructions (Addendum)
You have been scheduled for a colonoscopy and Endoscopy. Please follow written instructions given to you at your visit today.  Please pick up your prep supplies at the pharmacy within the next 1-3 days. [place]  If you use inhalers (even only as needed), please bring them with you on the day of your procedure. Your physician has requested that you go to www.startemmi.com and enter the access code given to you at your visit today. This web site gives a general overview about your procedure. However, you should still follow specific instructions given to you by our office regarding your preparation for the procedure.  Continue Omeprazole 20 mg, take 1 tab twice daily. Very soft and pureed diet.   If you are age 75 or older, your body mass index should be between 23-30. Your Body mass index is 19.01 kg/m. If this is out of the aforementioned range listed, please consider follow up with your Primary Care Provider.

## 2017-09-09 NOTE — Progress Notes (Signed)
Agree with assessment and plan as outlined.  

## 2017-09-11 ENCOUNTER — Telehealth: Payer: Self-pay | Admitting: Gastroenterology

## 2017-09-11 NOTE — Telephone Encounter (Signed)
Called and spoke to niece, Bethena Roys.  The discount card for Plenvu will not work with her insurance and she states her aunt cannot afford the prep.  I told her I would leave a sample for her at the front desk. She intends to pick up today. She expressed understanding and appreciation.

## 2017-09-22 ENCOUNTER — Ambulatory Visit (AMBULATORY_SURGERY_CENTER): Payer: Medicare Other | Admitting: Gastroenterology

## 2017-09-22 ENCOUNTER — Other Ambulatory Visit: Payer: Self-pay

## 2017-09-22 ENCOUNTER — Encounter: Payer: Self-pay | Admitting: Gastroenterology

## 2017-09-22 VITALS — BP 144/72 | HR 96 | Temp 97.1°F | Resp 20 | Ht 63.5 in | Wt 109.0 lb

## 2017-09-22 DIAGNOSIS — R634 Abnormal weight loss: Secondary | ICD-10-CM

## 2017-09-22 DIAGNOSIS — K317 Polyp of stomach and duodenum: Secondary | ICD-10-CM | POA: Diagnosis not present

## 2017-09-22 DIAGNOSIS — R1013 Epigastric pain: Secondary | ICD-10-CM

## 2017-09-22 DIAGNOSIS — K3189 Other diseases of stomach and duodenum: Secondary | ICD-10-CM | POA: Diagnosis not present

## 2017-09-22 DIAGNOSIS — D120 Benign neoplasm of cecum: Secondary | ICD-10-CM | POA: Diagnosis not present

## 2017-09-22 DIAGNOSIS — Z86010 Personal history of colonic polyps: Secondary | ICD-10-CM

## 2017-09-22 DIAGNOSIS — D123 Benign neoplasm of transverse colon: Secondary | ICD-10-CM

## 2017-09-22 DIAGNOSIS — Z860101 Personal history of adenomatous and serrated colon polyps: Secondary | ICD-10-CM

## 2017-09-22 MED ORDER — SODIUM CHLORIDE 0.9 % IV SOLN: 500.0000 mL | INTRAVENOUS | Status: DC

## 2017-09-22 NOTE — Patient Instructions (Signed)
NO IBUPROFEN,NAPROXEN,OR OTHER NON STEROIDAL ANTI INFLAMMATORY PRODUCTS FOR 2 WEEKS AFTER POLYP REMOVAL   INFORMATION ON POLYPS,DIVERTICULOSIS,& HEMORRHOIDS GIVEN TO YOU TODAY  AWAIT PATHOLOGY RESULTS ON BIOPSIES DONE AND ON POLYPS REMOVED   YOU HAD AN ENDOSCOPIC PROCEDURE TODAY AT [facility]:   Refer to the procedure report that was given to you for any specific questions about what was found during the examination.  If the procedure report does not answer your questions, please call your gastroenterologist to clarify.  If you requested that your care partner not be given the details of your procedure findings, then the procedure report has been included in a sealed envelope for you to review at your convenience later.  YOU SHOULD EXPECT: Some feelings of bloating in the abdomen. Passage of more gas than usual.  Walking can help get rid of the air that was put into your GI tract during the procedure and reduce the bloating. If you had a lower endoscopy (such as a colonoscopy or flexible sigmoidoscopy) you may notice spotting of blood in your stool or on the toilet paper. If you underwent a bowel prep for your procedure, you may not have a normal bowel movement for a few days.  Please Note:  You might notice some irritation and congestion in your nose or some drainage.  This is from the oxygen used during your procedure.  There is no need for concern and it should clear up in a day or so.  SYMPTOMS TO REPORT IMMEDIATELY:   Following lower endoscopy (colonoscopy or flexible sigmoidoscopy):  Excessive amounts of blood in the stool  Significant tenderness or worsening of abdominal pains  Swelling of the abdomen that is new, acute  Fever of 100F or higher   Following upper endoscopy (EGD)  Vomiting of blood or coffee ground material  New chest pain or pain under the shoulder blades  Painful or persistently difficult swallowing  New shortness of breath  Fever of 100F or  higher  Black, tarry-looking stools  For urgent or emergent issues, a gastroenterologist can be reached at any hour by calling [phone].   DIET:  We do recommend a small meal at first, but then you may proceed to your regular diet.  Drink plenty of fluids but you should avoid alcoholic beverages for 24 hours.  ACTIVITY:  You should plan to take it easy for the rest of today and you should NOT DRIVE or use heavy machinery until tomorrow (because of the sedation medicines used during the test).    FOLLOW UP: Our staff will call the number listed on your records the next business day following your procedure to check on you and address any questions or concerns that you may have regarding the information given to you following your procedure. If we do not reach you, we will leave a message.  However, if you are feeling well and you are not experiencing any problems, there is no need to return our call.  We will assume that you have returned to your regular daily activities without incident.  If any biopsies were taken you will be contacted by phone or by letter within the next 1-3 weeks.  Please call us at [phone] if you have not heard about the biopsies in 3 weeks.    SIGNATURES/CONFIDENTIALITY: You and/or your care partner have signed paperwork which will be entered into your electronic medical record.  These signatures attest to the fact that that the information above on your  After Visit Summary has been reviewed and is understood.  Full responsibility of the confidentiality of this discharge information lies with you and/or your care-partner.

## 2017-09-22 NOTE — Progress Notes (Signed)
Called to room to assist during endoscopic procedure.  Patient ID and intended procedure confirmed with present staff. Received instructions for my participation in the procedure from the performing physician.  

## 2017-09-22 NOTE — Progress Notes (Signed)
Report given to PACU, vss 

## 2017-09-22 NOTE — Op Note (Signed)
[facility] Patient Name: [name] Procedure Date: 09/22/2017 8:32 AM MRN: [id number] Endoscopist: Remo Lipps P. Armbruster MD, MD Age: 82 Referring MD:  Date of Birth: 1936-03-17 Gender: Female Account #: [id number] Procedure:                Upper GI endoscopy Indications:              Epigastric abdominal pain, Weight loss Medicines:                Monitored Anesthesia Care Procedure:                Pre-Anesthesia Assessment:                           - Prior to the procedure, a History and Physical                            was performed, and patient medications and                            allergies were reviewed. The patient's tolerance of                            previous anesthesia was also reviewed. The risks                            and benefits of the procedure and the sedation                            options and risks were discussed with the patient.                            All questions were answered, and informed consent                            was obtained. Prior Anticoagulants: The patient has                            taken no previous anticoagulant or antiplatelet                            agents. ASA Grade Assessment: III - A patient with                            severe systemic disease. After reviewing the risks                            and benefits, the patient was deemed in                            satisfactory condition to undergo the procedure.                           After obtaining informed consent, the endoscope was  passed under direct vision. Throughout the                            procedure, the patient's blood pressure, pulse, and                            oxygen saturations were monitored continuously. The                            Model GIF-HQ190 [id number]) scope was introduced                            through the mouth, and advanced to the second part                            of  duodenum. The upper GI endoscopy was                            accomplished without difficulty. The patient                            tolerated the procedure well. Scope In: Scope Out: Findings:                 Esophagogastric landmarks were identified: the                            Z-line was found at 36 cm, the gastroesophageal                            junction was found at 36 cm and the upper extent of                            the gastric folds was found at 36 cm from the                            incisors.                           The exam of the esophagus was otherwise normal.                           A subepithelial gastric nodule / mass with no                            bleeding and no stigmata of recent bleeding was                            found in the gastric fundus. It was hard with                            palpation with the forceps. Biopsies were taken  with a cold forceps for histology.                           There were a few subepithelial nodules, few mm in                            size, in the gastric body. The exam of the stomach                            was otherwise normal.                           Biopsies were taken with a cold forceps in the                            gastric body, at the incisura and in the gastric                            antrum for Helicobacter pylori testing.                           The duodenal bulb and second portion of the                            duodenum were normal. Complications:            No immediate complications. Estimated blood loss:                            Minimal. Estimated Blood Loss:     Estimated blood loss was minimal. Impression:               - Esophagogastric landmarks identified.                           - Normal esophagus                           - Subepithelial lesion noted in the gastric fundus.                            Biopsied.                           -  A few small few mm subepithelial nodules in the                            gastric body. Normal stomach otherwise. Biopsies                            taken to rule out H pylori                           - Normal duodenal bulb and second portion of the  duodenum.                           Gastric lesion correlates to calcifed lesion noted                            in the proximal stomach on CT scan, benign                            appearing. Recommendation:           - Patient has a contact number available for                            emergencies. The signs and symptoms of potential                            delayed complications were discussed with the                            patient. Return to normal activities tomorrow.                            Written discharge instructions were provided to the                            patient.                           - Resume previous diet.                           - Continue present medications.                           - Await pathology results.                           - Consideration for EUS to further evaluate gastric                            lesion Remo Lipps P. Armbruster MD, MD 09/22/2017 9:23:35 AM This report has been signed electronically.

## 2017-09-22 NOTE — Op Note (Signed)
[place] Patient Name: [name] Procedure Date: 09/22/2017 8:32 AM MRN: [id number] Endoscopist: Remo Lipps P. Armbruster MD, MD Age: 82 Referring MD:  Date of Birth: 09-Apr-1936 Gender: Female Account #: [id number] Procedure:                Colonoscopy Indications:              Surveillance: Personal history of adenomatous                            polyps on last colonoscopy > 5 years ago Medicines:                Monitored Anesthesia Care Procedure:                Pre-Anesthesia Assessment:                           - Prior to the procedure, a History and Physical                            was performed, and patient medications and                            allergies were reviewed. The patient's tolerance of                            previous anesthesia was also reviewed. The risks                            and benefits of the procedure and the sedation                            options and risks were discussed with the patient.                            All questions were answered, and informed consent                            was obtained. Prior Anticoagulants: The patient has                            taken no previous anticoagulant or antiplatelet                            agents. ASA Grade Assessment: III - A patient with                            severe systemic disease. After reviewing the risks                            and benefits, the patient was deemed in                            satisfactory condition to undergo the procedure.  After obtaining informed consent, the colonoscope                            was passed under direct vision. Throughout the                            procedure, the patient's blood pressure, pulse, and                            oxygen saturations were monitored continuously. The                            Colonoscope was introduced through the anus and                            advanced to  the the cecum, identified by                            appendiceal orifice and ileocecal valve. The                            colonoscopy was performed without difficulty. The                            patient tolerated the procedure well. The quality                            of the bowel preparation was good. The ileocecal                            valve, appendiceal orifice, and rectum were                            photographed. Scope In: 8:47:47 AM Scope Out: 9:10:03 AM Scope Withdrawal Time: 0 hours 19 minutes 14 seconds  Total Procedure Duration: 0 hours 22 minutes 16 seconds  Findings:                 The perianal and digital rectal examinations were                            normal.                           A 8 to 10 mm polyp was found in the cecum. The                            polyp was flat. The polyp was removed with a cold                            snare. Resection and retrieval were complete.                           Three sessile polyps were found in the transverse  colon. The polyps were 4 to 6 mm in size. These                            polyps were removed with a cold snare. Resection                            and retrieval were complete.                           A few small-mouthed diverticula were found in the                            transverse and sigmoid colon.                           Internal hemorrhoids were found during retroflexion.                           The exam was otherwise without abnormality. Complications:            No immediate complications. Estimated blood loss:                            Minimal. Estimated Blood Loss:     Estimated blood loss was minimal. Impression:               - One 8 to 10 mm polyp in the cecum, removed with a                            cold snare. Resected and retrieved.                           - Three 4 to 6 mm polyps in the transverse colon,                            removed  with a cold snare. Resected and retrieved.                           - Diverticulosis in the transverse and sigmoid                            colon.                           - Internal hemorrhoids.                           - The examination was otherwise normal. Recommendation:           - Patient has a contact number available for                            emergencies. The signs and symptoms of potential                            delayed  complications were discussed with the                            patient. Return to normal activities tomorrow.                            Written discharge instructions were provided to the                            patient.                           - Resume previous diet.                           - Continue present medications.                           - Await pathology results.                           - Repeat colonoscopy is recommended for                            surveillance. The colonoscopy date will be                            determined after pathology results from today's                            exam become available for review.                           - No ibuprofen, naproxen, or other non-steroidal                            anti-inflammatory drugs for 2 weeks after polyp                            removal. Remo Lipps P. Armbruster MD, MD 09/22/2017 9:15:51 AM This report has been signed electronically.

## 2017-09-22 NOTE — Progress Notes (Signed)
Esmolol 20 mg IV, vss   0857

## 2017-09-23 ENCOUNTER — Telehealth: Payer: Self-pay

## 2017-09-23 NOTE — Telephone Encounter (Signed)
  Follow up Call-  Call back number 09/22/2017  Post procedure Call Back phone  # [phone]  Permission to leave phone message Yes  Some recent data might be hidden     Patient questions:  Do you have a fever, pain , or abdominal swelling? No. Pain Score  0 *  Have you tolerated food without any problems? Yes.    Have you been able to return to your normal activities? Yes.    Do you have any questions about your discharge instructions: Diet   No. Medications  No. Follow up visit  No.  Do you have questions or concerns about your Care? No.  Actions: * If pain score is 4 or above: No action needed, pain <4.

## 2017-09-25 ENCOUNTER — Other Ambulatory Visit: Payer: Self-pay | Admitting: Internal Medicine

## 2017-09-25 DIAGNOSIS — Z1231 Encounter for screening mammogram for malignant neoplasm of breast: Secondary | ICD-10-CM

## 2017-10-02 ENCOUNTER — Telehealth: Payer: Self-pay

## 2017-10-02 NOTE — Telephone Encounter (Signed)
-----   Message from Milus Banister, MD sent at 10/02/2017 11:20 AM EST ----- Regarding: RE: possible EUS Richardson Landry, There may be two processes going on here. The calcified mass by CT is old (2007) and seems located between the stomach and liver.  That may be different from what is visible in the stomach, maybe not though.  I think EUS evaluation is a good idea.  If you can let her know, we'll get in touch to arrange  Zienna Ahlin, She needs upper EUS, radial +/- linear, next available EUS Thursday with MAC for gastric mass.    Thanks  dj  ----- Message ----- From: Yetta Flock, MD Sent: 10/01/2017  12:29 PM To: Milus Banister, MD Subject: possible EUS                                   Melissa Montane, Was curious what you thought of the gastric lesion on EGD for this patient. Seen on CT scan, seems calcified. Superficial biopsies negative. Not sure if EUS would add anything.   Thanks

## 2017-10-06 ENCOUNTER — Other Ambulatory Visit: Payer: Self-pay

## 2017-10-06 DIAGNOSIS — K3189 Other diseases of stomach and duodenum: Secondary | ICD-10-CM

## 2017-10-06 NOTE — Telephone Encounter (Signed)
10/16/17 2 pm EUS

## 2017-10-06 NOTE — Telephone Encounter (Signed)
EUS scheduled, pt instructed and medications reviewed.  Patient instructions mailed to home.  Patient to call with any questions or concerns.  

## 2017-10-06 NOTE — Telephone Encounter (Signed)
Patient returned phone call. Best # [phone]

## 2017-10-08 ENCOUNTER — Other Ambulatory Visit: Payer: Self-pay

## 2017-10-08 ENCOUNTER — Encounter (HOSPITAL_COMMUNITY): Payer: Self-pay | Admitting: Emergency Medicine

## 2017-10-15 ENCOUNTER — Ambulatory Visit
Admission: RE | Admit: 2017-10-15 | Discharge: 2017-10-15 | Disposition: A | Discharge status: Home / Self Care | Payer: Medicare Other | Source: Ambulatory Visit | Attending: Internal Medicine | Admitting: Internal Medicine

## 2017-10-15 ENCOUNTER — Other Ambulatory Visit: Payer: Self-pay | Admitting: Internal Medicine

## 2017-10-15 DIAGNOSIS — N6320 Unspecified lump in the left breast, unspecified quadrant: Secondary | ICD-10-CM

## 2017-10-15 DIAGNOSIS — N6310 Unspecified lump in the right breast, unspecified quadrant: Secondary | ICD-10-CM

## 2017-10-15 DIAGNOSIS — Z1231 Encounter for screening mammogram for malignant neoplasm of breast: Secondary | ICD-10-CM

## 2017-10-16 ENCOUNTER — Ambulatory Visit (HOSPITAL_COMMUNITY): Payer: Medicare Other | Admitting: Anesthesiology

## 2017-10-16 ENCOUNTER — Encounter (HOSPITAL_COMMUNITY)
Admission: RE | Disposition: A | Discharge status: Home / Self Care | Payer: Self-pay | Source: Ambulatory Visit | Attending: Gastroenterology

## 2017-10-16 ENCOUNTER — Ambulatory Visit (HOSPITAL_COMMUNITY)
Admission: RE | Admit: 2017-10-16 | Discharge: 2017-10-16 | Disposition: A | Discharge status: Home / Self Care | Payer: Medicare Other | Source: Ambulatory Visit | Attending: Gastroenterology | Admitting: Gastroenterology

## 2017-10-16 ENCOUNTER — Other Ambulatory Visit: Payer: Self-pay

## 2017-10-16 ENCOUNTER — Encounter (HOSPITAL_COMMUNITY): Payer: Self-pay | Admitting: Gastroenterology

## 2017-10-16 DIAGNOSIS — F419 Anxiety disorder, unspecified: Secondary | ICD-10-CM | POA: Diagnosis not present

## 2017-10-16 DIAGNOSIS — Z79899 Other long term (current) drug therapy: Secondary | ICD-10-CM | POA: Insufficient documentation

## 2017-10-16 DIAGNOSIS — Z87440 Personal history of urinary (tract) infections: Secondary | ICD-10-CM | POA: Diagnosis not present

## 2017-10-16 DIAGNOSIS — I509 Heart failure, unspecified: Secondary | ICD-10-CM | POA: Diagnosis not present

## 2017-10-16 DIAGNOSIS — I110 Hypertensive heart disease with heart failure: Secondary | ICD-10-CM | POA: Diagnosis not present

## 2017-10-16 DIAGNOSIS — Z87891 Personal history of nicotine dependence: Secondary | ICD-10-CM | POA: Insufficient documentation

## 2017-10-16 DIAGNOSIS — E7800 Pure hypercholesterolemia, unspecified: Secondary | ICD-10-CM | POA: Diagnosis not present

## 2017-10-16 DIAGNOSIS — E119 Type 2 diabetes mellitus without complications: Secondary | ICD-10-CM | POA: Insufficient documentation

## 2017-10-16 DIAGNOSIS — G4733 Obstructive sleep apnea (adult) (pediatric): Secondary | ICD-10-CM | POA: Diagnosis not present

## 2017-10-16 DIAGNOSIS — K3189 Other diseases of stomach and duodenum: Secondary | ICD-10-CM

## 2017-10-16 DIAGNOSIS — K219 Gastro-esophageal reflux disease without esophagitis: Secondary | ICD-10-CM | POA: Diagnosis not present

## 2017-10-16 DIAGNOSIS — K319 Disease of stomach and duodenum, unspecified: Secondary | ICD-10-CM | POA: Diagnosis not present

## 2017-10-16 HISTORY — PX: EUS: SHX5427

## 2017-10-16 LAB — GLUCOSE, CAPILLARY
GLUCOSE-CAPILLARY: 69 mg/dL (ref 65–99)
Glucose-Capillary: 87 mg/dL (ref 65–99)

## 2017-10-16 SURGERY — UPPER ENDOSCOPIC ULTRASOUND (EUS) RADIAL
Anesthesia: Monitor Anesthesia Care

## 2017-10-16 MED ORDER — PROPOFOL 10 MG/ML IV BOLUS
INTRAVENOUS | Status: AC
  Filled 2017-10-16: qty 20

## 2017-10-16 MED ORDER — SODIUM CHLORIDE 0.9 % IV SOLN: INTRAVENOUS | Status: DC

## 2017-10-16 MED ORDER — DEXTROSE 50 % IV SOLN
INTRAVENOUS | Status: AC
  Filled 2017-10-16: qty 50

## 2017-10-16 MED ORDER — PROPOFOL 10 MG/ML IV BOLUS
INTRAVENOUS | Status: AC
  Filled 2017-10-16: qty 40

## 2017-10-16 MED ORDER — PROPOFOL 500 MG/50ML IV EMUL
INTRAVENOUS | Status: DC | PRN
  Administered 2017-10-16: 10 mg via INTRAVENOUS
  Administered 2017-10-16: 40 mg via INTRAVENOUS

## 2017-10-16 MED ORDER — PROPOFOL 500 MG/50ML IV EMUL
INTRAVENOUS | Status: DC | PRN
  Administered 2017-10-16: 150 ug/kg/min via INTRAVENOUS

## 2017-10-16 MED ORDER — DEXTROSE 50 % IV SOLN
25.0000 mL | Freq: Once | INTRAVENOUS | Status: AC
  Administered 2017-10-16: 25 mL via INTRAVENOUS

## 2017-10-16 MED ORDER — LACTATED RINGERS IV SOLN
INTRAVENOUS | Status: DC
  Administered 2017-10-16: 1000 mL via INTRAVENOUS

## 2017-10-16 NOTE — H&P (Signed)
HPI: This is a 82 yo woman  Chief complaint is abnormal stomach on recent EGD  ROS: complete GI ROS as described in HPI, all other review negative.  Constitutional:  No unintentional weight loss   Past Medical History:  Diagnosis Date  . Allergy   . Anemia    "when I was young"  . Anginal pain ([place])   . Anxiety   . Arthritis    "all over my body"   . Chronic bronchitis ([place])    "get it just about q yr" (06/14/2015)  . Chronic lower back pain   . Dysrhythmia    patient unsure what it is but has been told that.  . Exertional shortness of breath   . Frequent UTI    "recently" (06/14/2015)  . GERD (gastroesophageal reflux disease)   . Headache(784.0)    "weekly; wake up w/them " (110/19/2018)  . Heart murmur   . High cholesterol   . History of blood transfusion ~ 1954   "related to my periods"  . Hypertension   . Migraine    "once/month maybe" (05/16/2017)  . OSA (obstructive sleep apnea)    "I think I'm using a BiPAP" (06/14/2015)  . Type II diabetes mellitus ([place])     Past Surgical History:  Procedure Laterality Date  . APPENDECTOMY  1970   Archie Endo 12/12/2010  . BACK SURGERY    . [place]   Archie Endo 12/12/2010  . FRACTURE SURGERY    . LAPAROSCOPIC CHOLECYSTECTOMY  1990's  . LUMBAR MICRODISCECTOMY Left 09/2006   L5-S1/notes 12/12/2010  . MULTIPLE EXTRACTIONS WITH ALVEOLOPLASTY Bilateral 06/13/2017   Procedure: MULTIPLE EXTRACTION WITH ALVEOLOPLASTY;  Surgeon: Diona Browner, DDS;  Location: [facility];  Service: Oral Surgery;  Laterality: Bilateral;  . MULTIPLE TOOTH EXTRACTIONS    . ORIF SHOULDER FRACTURE Left ~ 2009   "put cement down in it"   . TONSILLECTOMY  1950's?  . TOTAL ABDOMINAL HYSTERECTOMY  1979   Archie Endo 12/12/2010    Current Facility-Administered Medications  Medication Dose Route Frequency Provider Last Rate Last Dose  . 0.9 %  sodium chloride infusion   Intravenous Continuous Milus Banister, MD      . lactated ringers  infusion   Intravenous Continuous Milus Banister, MD        Allergies as of 10/06/2017  . (No Known Allergies)    Family History  Problem Relation Age of Onset  . Hypertension Mother   . Heart attack Father   . Diabetes Mellitus II Sister   . Hypertension Sister   . Cancer - Other Sister   . Colon cancer Neg Hx     Social History   Socioeconomic History  . Marital status: Widowed    Spouse name: Not on file  . Number of children: Not on file  . Years of education: Not on file  . Highest education level: Not on file  Occupational History  . Not on file  Social Needs  . Financial resource strain: Not on file  . Food insecurity:    Worry: Not on file    Inability: Not on file  . Transportation needs:    Medical: Not on file    Non-medical: Not on file  Tobacco Use  . Smoking status: Former Smoker    Types: Cigarettes  . Smokeless tobacco: Never Used  . Tobacco comment: 05/16/2017"1 pack of cigarettes would last 3-4 months; haven't had any cigarettes for 40 years"  Substance and Sexual Activity  .  Alcohol use: Yes    Comment: 05/16/2017 "used to drink beer in the 1990's; don't drink at all now"  . Drug use: No  . Sexual activity: Never    Birth control/protection: Abstinence  Lifestyle  . Physical activity:    Days per week: Not on file    Minutes per session: Not on file  . Stress: Not on file  Relationships  . Social connections:    Talks on phone: Not on file    Gets together: Not on file    Attends religious service: Not on file    Active member of club or organization: Not on file    Attends meetings of clubs or organizations: Not on file    Relationship status: Not on file  . Intimate partner violence:    Fear of current or ex partner: Not on file    Emotionally abused: Not on file    Physically abused: Not on file    Forced sexual activity: Not on file  Other Topics Concern  . Not on file  Social History Narrative  . Not on file     Physical  Exam: There were no vitals taken for this visit. Constitutional: generally well-appearing Psychiatric: alert and oriented x3 Abdomen: soft, nontender, nondistended, no obvious ascites, no peritoneal signs, normal bowel sounds No peripheral edema noted in lower extremities  Assessment and plan: 82 y.o. female with abnormal stomach on recent EGD  For upper EUS today  Please see the "Patient Instructions" section for addition details about the plan.  Owens Loffler, MD [facility] 10/16/2017, 12:24 PM

## 2017-10-16 NOTE — Anesthesia Postprocedure Evaluation (Signed)
Anesthesia Post Note  Patient: [name]  Procedure(s) Performed: UPPER ENDOSCOPIC ULTRASOUND (EUS) RADIAL (N/A )     Patient location during evaluation: Endoscopy Anesthesia Type: MAC Level of consciousness: awake Pain management: pain level controlled Vital Signs Assessment: post-procedure vital signs reviewed and stable Respiratory status: spontaneous breathing Cardiovascular status: stable Anesthetic complications: no    Last Vitals:  Vitals Value Taken Time  BP    Temp    Pulse    Resp    SpO2      Last Pain:  Vitals:   10/16/17 1415  TempSrc: Oral  PainSc: 0-No pain                 Erza Mothershead

## 2017-10-16 NOTE — Op Note (Signed)
[facility] Patient Name: [name] Procedure Date: 10/16/2017 MRN: [id number] Attending MD: Milus Banister , MD Date of Birth: Dec 16, 1935 CSN: [id number] Age: 82 Admit Type: Outpatient Procedure:                Upper EUS Indications:              Gastric deformity on endoscopy/Subepithelial tumor                            versus extrinsic compression Providers:                Milus Banister, MD, Angus Seller, Elspeth Cho Tech., Technician, Rosario Adie, CRNA Referring MD:             Jolly Mango, MD Medicines:                Monitored Anesthesia Care Complications:            No immediate complications. Estimated blood loss:                            None. Estimated Blood Loss:     Estimated blood loss: none. Procedure:                Pre-Anesthesia Assessment:                           - Prior to the procedure, a History and Physical                            was performed, and patient medications and                            allergies were reviewed. The patient's tolerance of                            previous anesthesia was also reviewed. The risks                            and benefits of the procedure and the sedation                            options and risks were discussed with the patient.                            All questions were answered, and informed consent                            was obtained. Prior Anticoagulants: The patient has                            taken no previous anticoagulant or antiplatelet                            agents.  ASA Grade Assessment: III - A patient with                            severe systemic disease. After reviewing the risks                            and benefits, the patient was deemed in                            satisfactory condition to undergo the procedure.                           After obtaining informed consent, the endoscope was               passed under direct vision. Throughout the                            procedure, the patient's blood pressure, pulse, and                            oxygen saturations were monitored continuously. The                            [id number] ([id number]) scope was introduced through                            the mouth, and advanced to the second part of                            duodenum. The upper EUS was accomplished without                            difficulty. The patient tolerated the procedure                            well. Scope In: Scope Out: Findings:      Endoscopic Finding (limited views with radial echoendoscope): :      1. 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 Measures approximately 2cm across.      2. UGI tract otherwise normal.      Endosonographic Finding :      1. The subepithelial lesion in the proximal stomach described above       correlates with a densly calcified (hyperechoic, shadowing) lesion that       appears to involve the wall of the stomach. There is no evident       associated soft tissue. The lesion measures 2.3cm across maximally.      2. The gastric wall was otherwise normal by EUS      3. Limited views of liver, pancreas, spleen were all normal. Impression:               - The previously noted firm subepithelial lesion                            correlates with a  heavily calcified lesion in the                            proximal gastric wall that has been present by                            imaging since at least 2007 and has not changed                            significantly since then. I do not think this is                            causing any of her symptoms and it does not require                            any surveillance. Moderate Sedation:      N/A- Per Anesthesia Care Recommendation:           - Discharge patient to home. Procedure Code(s):        --- Professional ---                            [phone], Esophagogastroduodenoscopy, flexible,                            transoral; with endoscopic ultrasound examination                            limited to the esophagus, stomach or duodenum, and                            adjacent structures Diagnosis Code(s):        --- Professional ---                           K31.89, Other diseases of stomach and duodenum CPT copyright 2016 American Medical Association. All rights reserved. The codes documented in this report are preliminary and upon coder review may  be revised to meet current compliance requirements. Milus Banister, MD 10/16/2017 2:17:13 PM This report has been signed electronically. Number of Addenda: 0

## 2017-10-16 NOTE — Progress Notes (Signed)
1239 Blood sugar 69, patient denies being symptomatic, states BS normally "runs 200s or lower"  States does feel symptomatic when running low below 70.  Discussed with Dr. Nyoka Cowden, Dextrose 50% 25 cc given per MD.

## 2017-10-16 NOTE — Discharge Instructions (Signed)
YOU HAD AN ENDOSCOPIC PROCEDURE TODAY: Refer to the procedure report and other information in the discharge instructions given to you for any specific questions about what was found during the examination. If this information does not answer your questions, please call [place] office at [phone] to clarify.  ° °YOU SHOULD EXPECT: Some feelings of bloating in the abdomen. Passage of more gas than usual. Walking can help get rid of the air that was put into your GI tract during the procedure and reduce the bloating. If you had a lower endoscopy (such as a colonoscopy or flexible sigmoidoscopy) you may notice spotting of blood in your stool or on the toilet paper. Some abdominal soreness may be present for a day or two, also. ° °DIET: Your first meal following the procedure should be a light meal and then it is ok to progress to your normal diet. A half-sandwich or bowl of soup is an example of a good first meal. Heavy or fried foods are harder to digest and may make you feel nauseous or bloated. Drink plenty of fluids but you should avoid alcoholic beverages for 24 hours. If you had a esophageal dilation, please see attached instructions for diet.   ° °ACTIVITY: Your care partner should take you home directly after the procedure. You should plan to take it easy, moving slowly for the rest of the day. You can resume normal activity the day after the procedure however YOU SHOULD NOT DRIVE, use power tools, machinery or perform tasks that involve climbing or major physical exertion for 24 hours (because of the sedation medicines used during the test).  ° °SYMPTOMS TO REPORT IMMEDIATELY: °A gastroenterologist can be reached at any hour. Please call [phone]  for any of the following symptoms:  °Following lower endoscopy (colonoscopy, flexible sigmoidoscopy) °Excessive amounts of blood in the stool  °Significant tenderness, worsening of abdominal pains  °Swelling of the abdomen that is new, acute  °Fever of 100° or  higher  °Following upper endoscopy (EGD, EUS, ERCP, esophageal dilation) °Vomiting of blood or coffee ground material  °New, significant abdominal pain  °New, significant chest pain or pain under the shoulder blades  °Painful or persistently difficult swallowing  °New shortness of breath  °Black, tarry-looking or red, bloody stools ° °FOLLOW UP:  °If any biopsies were taken you will be contacted by phone or by letter within the next 1-3 weeks. Call [phone]  if you have not heard about the biopsies in 3 weeks.  °Please also call with any specific questions about appointments or follow up tests. ° °

## 2017-10-16 NOTE — Anesthesia Preprocedure Evaluation (Addendum)
Anesthesia Evaluation  Patient identified by MRN, date of birth, ID band Patient awake    Reviewed: Allergy & Precautions, NPO status , Patient's Chart, lab work & pertinent test results  Airway Mallampati: II  TM Distance: >3 FB     Dental   Pulmonary sleep apnea , COPD, former smoker,    breath sounds clear to auscultation       Cardiovascular hypertension, + angina +CHF  + dysrhythmias  Rhythm:Regular Rate:Normal     Neuro/Psych  Headaches,    GI/Hepatic Neg liver ROS, GERD  ,  Endo/Other  diabetes  Renal/GU Renal disease     Musculoskeletal   Abdominal   Peds  Hematology  (+) anemia ,   Anesthesia Other Findings   Reproductive/Obstetrics                            Anesthesia Physical Anesthesia Plan  ASA: III  Anesthesia Plan: MAC   Post-op Pain Management:    Induction: Intravenous  PONV Risk Score and Plan: 2 and Treatment may vary due to age or medical condition  Airway Management Planned: Simple Face Mask and Nasal Cannula  Additional Equipment:   Intra-op Plan:   Post-operative Plan:   Informed Consent: I have reviewed the patients History and Physical, chart, labs and discussed the procedure including the risks, benefits and alternatives for the proposed anesthesia with the patient or authorized representative who has indicated his/her understanding and acceptance.   Dental advisory given  Plan Discussed with: CRNA and Anesthesiologist  Anesthesia Plan Comments:         Anesthesia Quick Evaluation

## 2017-10-16 NOTE — Anesthesia Procedure Notes (Signed)
Date/Time: 10/16/2017 1:48 PM Performed by: Glory Buff, CRNA Oxygen Delivery Method: Nasal cannula

## 2017-10-16 NOTE — Transfer of Care (Signed)
Immediate Anesthesia Transfer of Care Note  Patient: [name]  Procedure(s) Performed: UPPER ENDOSCOPIC ULTRASOUND (EUS) RADIAL (N/A )  Patient Location: PACU  Anesthesia Type:MAC  Level of Consciousness: awake, alert  and oriented  Airway & Oxygen Therapy: Patient Spontanous Breathing and Patient connected to nasal cannula oxygen  Post-op Assessment: Report given to RN and Post -op Vital signs reviewed and stable  Post vital signs: Reviewed and stable  Last Vitals:  Vitals Value Taken Time  BP    Temp    Pulse 96 10/16/2017  2:14 PM  Resp 24 10/16/2017  2:14 PM  SpO2 100 % 10/16/2017  2:14 PM  Vitals shown include unvalidated device data.  Last Pain:  Vitals:   10/16/17 1233  TempSrc: Oral         Complications: No apparent anesthesia complications

## 2017-10-17 ENCOUNTER — Encounter (HOSPITAL_COMMUNITY): Payer: Self-pay | Admitting: Gastroenterology

## 2018-04-27 ENCOUNTER — Other Ambulatory Visit: Payer: Self-pay | Admitting: Internal Medicine

## 2018-04-27 ENCOUNTER — Ambulatory Visit
Admission: RE | Admit: 2018-04-27 | Discharge: 2018-04-27 | Disposition: A | Discharge status: Home / Self Care | Payer: Medicare Other | Source: Ambulatory Visit | Attending: Internal Medicine | Admitting: Internal Medicine

## 2018-04-27 DIAGNOSIS — N6310 Unspecified lump in the right breast, unspecified quadrant: Secondary | ICD-10-CM

## 2018-06-01 ENCOUNTER — Ambulatory Visit (INDEPENDENT_AMBULATORY_CARE_PROVIDER_SITE_OTHER): Payer: Medicare Other | Admitting: Pulmonary Disease

## 2018-06-01 ENCOUNTER — Other Ambulatory Visit: Payer: Self-pay | Admitting: Pulmonary Disease

## 2018-06-01 ENCOUNTER — Encounter: Payer: Self-pay | Admitting: Pulmonary Disease

## 2018-06-01 VITALS — BP 180/108 | HR 104 | Ht 61.42 in | Wt 138.0 lb

## 2018-06-01 DIAGNOSIS — I10 Essential (primary) hypertension: Secondary | ICD-10-CM | POA: Diagnosis not present

## 2018-06-01 DIAGNOSIS — M069 Rheumatoid arthritis, unspecified: Secondary | ICD-10-CM

## 2018-06-01 DIAGNOSIS — R0600 Dyspnea, unspecified: Secondary | ICD-10-CM | POA: Diagnosis not present

## 2018-06-01 DIAGNOSIS — I5031 Acute diastolic (congestive) heart failure: Secondary | ICD-10-CM | POA: Diagnosis not present

## 2018-06-01 MED ORDER — FUROSEMIDE 40 MG PO TABS: 40.0000 mg | ORAL_TABLET | Freq: Every day | ORAL | 0 refills | Status: DC

## 2018-06-01 MED ORDER — LISINOPRIL-HYDROCHLOROTHIAZIDE 10-12.5 MG PO TABS: 1.0000 | ORAL_TABLET | Freq: Every day | ORAL | 2 refills | Status: DC

## 2018-06-01 NOTE — Progress Notes (Signed)
Synopsis: Referred in November 2019 for dyspnea, she is a lifelong non-smoker. She was told in June 2018 that she had a diagnosis of diastolic heart failure while hospitalized for severe sepsis related to a UTI. She has a history of rheumatoid arthritis and takes Plaquenil  Subjective:   PATIENT ID: [name] GENDER: female DOB: 08-05-1935, MRN: [id number]   HPI  Chief Complaint  Patient presents with  . New Consult    shortness of breath    This is a pleasant 82 year old female who has a past medical history significant for rheumatoid arthritis who has never smoked cigarettes who comes to my clinic today for evaluation of shortness of breath which is been a problem for her for the last 2 to 3 months.  She says that she has been experiencing orthopnea.  Specifically she says that whenever she lay flat she feels like she is smothering and she has to sit up and then go outside on her porch around 2:58 AM to try to catch her breath.  She does not cough, she does not produce mucus, and she does not wheeze.  She says that she has been dealing with shortness of breath for several years and has been taking Symbicort but she says that it does not help.  She is unsure if she has an underlying lung problem.  She says that no one in her family has lung problems.  She tells me that she is never smoked cigarettes.  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  She denies recent problems with leg swelling.  She was seen by her physician in the rheumatology office a week ago and there was concern for an elevated right hemidiaphragm so she was sent to [place] for further evaluation.  Past Medical History:  Diagnosis Date  . Allergy   . Anemia    "when I was young"  . Anginal pain ([place])   . Anxiety   . Arthritis    "all over my body"   . Chronic bronchitis ([place])    "get it just about q yr" (06/14/2015)  . Chronic lower back pain   . Dysrhythmia    patient unsure what it is but has been  told that.  . Exertional shortness of breath   . Frequent UTI    "recently" (06/14/2015)  . GERD (gastroesophageal reflux disease)   . Headache(784.0)    "weekly; wake up w/them " (110/19/2018)  . Heart murmur   . High cholesterol   . History of blood transfusion ~ 1954   "related to my periods"  . Hypertension   . Migraine    "once/month maybe" (05/16/2017)  . OSA (obstructive sleep apnea)    "I think I'm using a BiPAP" (06/14/2015)  . Type II diabetes mellitus (HCC)      Family History  Problem Relation Age of Onset  . Hypertension Mother   . Heart attack Father   . Diabetes Mellitus II Sister   . Hypertension Sister   . Cancer - Other Sister   . Colon cancer Neg Hx      Social History   Socioeconomic History  . Marital status: Widowed    Spouse name: Not on file  . Number of children: Not on file  . Years of education: Not on file  . Highest education level: Not on file  Occupational History  . Not on file  Social Needs  . Financial resource strain: Not on file  . Food  insecurity:    Worry: Not on file    Inability: Not on file  . Transportation needs:    Medical: Not on file    Non-medical: Not on file  Tobacco Use  . Smoking status: Former Smoker    Types: Cigarettes  . Smokeless tobacco: Never Used  . Tobacco comment: 05/16/2017"1 pack of cigarettes would last 3-4 months; haven't had any cigarettes for 40 years"  Substance and Sexual Activity  . Alcohol use: Yes    Comment: 05/16/2017 "used to drink beer in the 1990's; don't drink at all now"  . Drug use: No  . Sexual activity: Never    Birth control/protection: Abstinence  Lifestyle  . Physical activity:    Days per week: Not on file    Minutes per session: Not on file  . Stress: Not on file  Relationships  . Social connections:    Talks on phone: Not on file    Gets together: Not on file    Attends religious service: Not on file    Active member of club or organization: Not on file     Attends meetings of clubs or organizations: Not on file    Relationship status: Not on file  . Intimate partner violence:    Fear of current or ex partner: Not on file    Emotionally abused: Not on file    Physically abused: Not on file    Forced sexual activity: Not on file  Other Topics Concern  . Not on file  Social History Narrative  . Not on file     No Known Allergies   Outpatient Medications Prior to Visit  Medication Sig Dispense Refill  . albuterol (ACCUNEB) 0.63 MG/3ML nebulizer solution Take 2 ampules by nebulization every 6 (six) hours as needed for wheezing.    Marland Kitchen aspirin EC 81 MG tablet Take 1 tablet (81 mg total) by mouth daily.    Marland Kitchen atorvastatin (LIPITOR) 40 MG tablet Take 40 mg by mouth at bedtime.    . budesonide-formoterol (SYMBICORT) 160-4.5 MCG/ACT inhaler Inhale 2 puffs into the lungs 2 (two) times daily.    . cetirizine (ZYRTEC) 10 MG tablet Take 10 mg by mouth daily as needed for allergies.    Marland Kitchen dicyclomine (BENTYL) 20 MG tablet Take 20 mg by mouth 2 (two) times daily.  1  . docusate sodium (COLACE) 100 MG capsule Take 100 mg by mouth 2 (two) times daily as needed for mild constipation.    . fluticasone (FLONASE) 50 MCG/ACT nasal spray Place 2 sprays into both nostrils daily as needed for allergies or rhinitis.    . hydroxychloroquine (PLAQUENIL) 200 MG tablet Take 200 mg by mouth 2 (two) times daily.  0  . methylPREDNISolone (MEDROL) 4 MG tablet Take 2 mg by mouth daily.     Marland Kitchen omeprazole (PRILOSEC) 20 MG capsule Take 20 mg by mouth 2 (two) times daily.  0  . sitaGLIPtin (JANUVIA) 100 MG tablet Take 100 mg by mouth daily.    . traMADol (ULTRAM) 50 MG tablet Take 50 mg daily as needed by mouth for moderate pain.      No facility-administered medications prior to visit.     Review of Systems  Constitutional: Negative for fever and weight loss.  HENT: Positive for congestion. Negative for ear pain, nosebleeds and sore throat.   Eyes: Negative for redness.    Respiratory: Positive for shortness of breath and wheezing. Negative for cough.   Cardiovascular: Positive for palpitations. Negative  for leg swelling and PND.  Gastrointestinal: Negative for nausea and vomiting.  Genitourinary: Negative for dysuria.  Skin: Negative for rash.  Neurological: Positive for headaches.  Endo/Heme/Allergies: Does not bruise/bleed easily.  Psychiatric/Behavioral: Negative for depression. The patient is not nervous/anxious.       Objective:  Physical Exam   Vitals:   06/01/18 1154  BP: (!) 180/108  Pulse: (!) 104  SpO2: 96%  Weight: 138 lb (62.6 kg)  Height: 5' 1.42" (1.56 m)    Gen: chronically ill appearing, no acute distress HENT: NCAT, OP clear, neck supple without masses Eyes: PERRL, EOMi Lymph: no cervical lymphadenopathy PULM: Crackles bases B CV: Tachycardic, no mgr, NOTABLE JVD GI: BS+, soft, nontender, no hsm Derm: no rash or skin breakdown MSK: normal bulk and tone Neuro: A&Ox4, CN II-XII intact, strength 5/5 in all 4 extremities Psyche: normal mood and affect   CBC    Component Value Date/Time   WBC 6.5 08/23/2017 0738   RBC 4.11 08/23/2017 0738   HGB 12.5 08/23/2017 0738   HCT 37.2 08/23/2017 0738   PLT 220 08/23/2017 0738   MCV 90.5 08/23/2017 0738   MCH 30.4 08/23/2017 0738   MCHC 33.6 08/23/2017 0738   RDW 14.1 08/23/2017 0738   LYMPHSABS 2.9 03/02/2017 0714   MONOABS 0.8 03/02/2017 0714   EOSABS 0.4 03/02/2017 0714   BASOSABS 0.1 03/02/2017 0714     Chest imaging: Chest x-ray report from rheumatology office from October 2019 showed an elevated right hemidiaphragm.  PFT:  Labs:  Path:  Echo: June 2018 echocardiogram showed a normal LVEF with moderate left ventricular hypertrophy, normal valves  Heart Catheterization:  Records from her visit with rheumatology last week for rheumatoid arthritis and shortness of breath reviewed, she was continued on hydrochloric when for her rheumatoid arthritis affecting  multiple joints.  A chest x-ray showed an elevated right hemidiaphragm so she was referred to [place] for further evaluation.     Assessment & Plan:   Dyspnea, unspecified type  Essential hypertension  Acute diastolic heart failure (HCC)  Rheumatoid arthritis, involving unspecified site, unspecified rheumatoid factor presence ([place])  Discussion: This is a pleasant 82 year old female who comes to my clinic today for complaints of dyspnea and orthopnea.  On physical exam she is markedly hypertensive, her JVD is elevated and she has crackles in the bases of her lungs.  The most likely explanation for her shortness of breath is acute exacerbation of diastolic heart failure.  However, there is a recent finding of an elevated right hemidiaphragm on a chest x-ray.  I suspect this is likely a pleural effusion.  I would like to give her diuresis and blood pressure control for a week before repeating a chest x-ray.  Differential diagnosis of dyspnea is broad.  Lung problems, heart problems, anemia, and neuromuscular weakness are all possible that in her particular case I believe she has heart failure based on her physical exam and symptoms.  That being said, there was concern for an elevated right hemidiaphragm so when she returns in 1 week I would like to repeat a chest x-ray.  If it is still elevated then we can assess with a diaphragm fluoroscopy.  Plan: Acute diastolic heart failure: Take furosemide 40 mg daily Lab work in 1 week: proBNP, blood chemistry  Shortness of breath: This is due to heart failure We will check a chest x-ray in 1 week after you have taken the diuretic medicine If you are still short of breath in a  week then we can check a lung function test and perform more evaluation. Stop symbicort  Poorly controlled hypertension: Start taking lisinopril/HCTZ once daily  Rheumatoid arthritis: Continue Plaquenil  We will see you back in 1 week with a nurse practitioner   Current  Outpatient Medications:  .  albuterol (ACCUNEB) 0.63 MG/3ML nebulizer solution, Take 2 ampules by nebulization every 6 (six) hours as needed for wheezing., Disp: , Rfl:  .  aspirin EC 81 MG tablet, Take 1 tablet (81 mg total) by mouth daily., Disp: , Rfl:  .  atorvastatin (LIPITOR) 40 MG tablet, Take 40 mg by mouth at bedtime., Disp: , Rfl:  .  budesonide-formoterol (SYMBICORT) 160-4.5 MCG/ACT inhaler, Inhale 2 puffs into the lungs 2 (two) times daily., Disp: , Rfl:  .  cetirizine (ZYRTEC) 10 MG tablet, Take 10 mg by mouth daily as needed for allergies., Disp: , Rfl:  .  dicyclomine (BENTYL) 20 MG tablet, Take 20 mg by mouth 2 (two) times daily., Disp: , Rfl: 1 .  docusate sodium (COLACE) 100 MG capsule, Take 100 mg by mouth 2 (two) times daily as needed for mild constipation., Disp: , Rfl:  .  fluticasone (FLONASE) 50 MCG/ACT nasal spray, Place 2 sprays into both nostrils daily as needed for allergies or rhinitis., Disp: , Rfl:  .  hydroxychloroquine (PLAQUENIL) 200 MG tablet, Take 200 mg by mouth 2 (two) times daily., Disp: , Rfl: 0 .  methylPREDNISolone (MEDROL) 4 MG tablet, Take 2 mg by mouth daily. , Disp: , Rfl:  .  omeprazole (PRILOSEC) 20 MG capsule, Take 20 mg by mouth 2 (two) times daily., Disp: , Rfl: 0 .  sitaGLIPtin (JANUVIA) 100 MG tablet, Take 100 mg by mouth daily., Disp: , Rfl:  .  traMADol (ULTRAM) 50 MG tablet, Take 50 mg daily as needed by mouth for moderate pain. , Disp: , Rfl:  .  furosemide (LASIX) 40 MG tablet, Take 1 tablet (40 mg total) by mouth daily for 10 days., Disp: 10 tablet, Rfl: 0 .  lisinopril-hydrochlorothiazide (PRINZIDE,ZESTORETIC) 10-12.5 MG tablet, Take 1 tablet by mouth daily., Disp: 30 tablet, Rfl: 2

## 2018-06-01 NOTE — Patient Instructions (Signed)
Acute diastolic heart failure: Take furosemide 40 mg daily Lab work in 1 week: proBNP, blood chemistry  Shortness of breath: This is due to heart failure We will check a chest x-ray in 1 week after you have taken the diuretic medicine If you are still short of breath in a week then we can check a lung function test and perform more evaluation. Stop symbicort  Poorly controlled hypertension: Start taking lisinopril/HCTZ once daily  Rheumatoid arthritis: Continue Plaquenil  We will see you back in 1 week with a nurse practitioner

## 2018-06-08 ENCOUNTER — Other Ambulatory Visit (INDEPENDENT_AMBULATORY_CARE_PROVIDER_SITE_OTHER): Payer: Medicare Other

## 2018-06-08 ENCOUNTER — Encounter: Payer: Self-pay | Admitting: Nurse Practitioner

## 2018-06-08 ENCOUNTER — Ambulatory Visit (INDEPENDENT_AMBULATORY_CARE_PROVIDER_SITE_OTHER)
Admission: RE | Admit: 2018-06-08 | Discharge: 2018-06-08 | Disposition: A | Discharge status: Home / Self Care | Payer: Medicare Other | Source: Ambulatory Visit | Attending: Pulmonary Disease | Admitting: Pulmonary Disease

## 2018-06-08 ENCOUNTER — Ambulatory Visit (INDEPENDENT_AMBULATORY_CARE_PROVIDER_SITE_OTHER): Payer: Medicare Other | Admitting: Nurse Practitioner

## 2018-06-08 ENCOUNTER — Other Ambulatory Visit: Payer: Self-pay | Admitting: Pulmonary Disease

## 2018-06-08 VITALS — BP 140/94 | HR 98 | Ht 61.42 in | Wt 138.0 lb

## 2018-06-08 DIAGNOSIS — R0600 Dyspnea, unspecified: Secondary | ICD-10-CM

## 2018-06-08 DIAGNOSIS — J449 Chronic obstructive pulmonary disease, unspecified: Secondary | ICD-10-CM | POA: Diagnosis not present

## 2018-06-08 DIAGNOSIS — J986 Disorders of diaphragm: Secondary | ICD-10-CM

## 2018-06-08 DIAGNOSIS — J441 Chronic obstructive pulmonary disease with (acute) exacerbation: Secondary | ICD-10-CM | POA: Diagnosis not present

## 2018-06-08 LAB — BASIC METABOLIC PANEL
BUN: 12 mg/dL (ref 6–23)
CHLORIDE: 98 meq/L (ref 96–112)
CO2: 29 mEq/L (ref 19–32)
CREATININE: 0.85 mg/dL (ref 0.40–1.20)
Calcium: 10 mg/dL (ref 8.4–10.5)
GFR: 82.71 mL/min (ref >60.00)
GLUCOSE: 179 mg/dL — AB (ref 70–99)
Potassium: 3.7 mEq/L (ref 3.5–5.1)
Sodium: 138 mEq/L (ref 135–145)

## 2018-06-08 NOTE — Patient Instructions (Addendum)
Patient states that she could not tolerate lasix and lisinopril/HCTZ together. She became very dizzy and quit both medications Please continue lisinopril/HCTZ Low sodium diet  Stay active Will order PFT with appointment directly after with Dr. Lake Bells. Follow up sooner if needed Will call with lab results Right hemidiaphragm still elevated - will order diaphragm fluoroscopy

## 2018-06-08 NOTE — Progress Notes (Signed)
Reviewed, agree 

## 2018-06-08 NOTE — Progress Notes (Signed)
@Patient  ID: [name], female    DOB: March 14, 1936, 82 y.o.   MRN: [id number]  Chief Complaint  Patient presents with  . Follow-up    Referring provider: Nolene Ebbs, MD  HPI  82 year old female former smoker with COPD followed by Dr. Lake Bells  Tests:  Chest imaging: Chest x-ray report from rheumatology office from October 2019 showed an elevated right hemidiaphragm. CXR:   Echo: June 2018 echocardiogram showed a normal LVEF with moderate left ventricular hypertrophy, normal valves  Note: Records from her visit with rheumatology for rheumatoid arthritis and shortness of breath reviewed, she was continued on hydrochloric for her rheumatoid arthritis affecting multiple joints.  A chest x-ray showed an elevated right hemidiaphragm so she was referred to [place] for further evaluation.  OV 06/08/18 - Follow up Patient presents for follow up on dyspnea, orthopnea, and hypertension. She was prescribed lasix and lisinopril with HCTZ at last visit with Dr. Lake Bells. She states that the medications made her dizzy so she stopped taking them after 3 days. She has not checked her blood pressures at home. She states that she is still short of breath. Her blood pressure was elevated in office today, but much better than last visit. Her right hemidiaphragm still looks elevated on chest x ray today. She denies any fever, chest pain, or edema.       Allergies  Allergen Reactions  . Lisinopril-Hydrochlorothiazide     Patient stated the medication made her feel dizzy, did not like hot it made her feel.    Immunization History  Administered Date(s) Administered  . Influenza, High Dose Seasonal PF 05/18/2017  . Influenza,inj,Quad PF,6+ Mos 04/08/2014  . Influenza-Unspecified 04/14/2015, 04/28/2018  . Pneumococcal Polysaccharide-23 12/11/2012    Past Medical History:  Diagnosis Date  . Allergy   . Anemia    "when I was young"  . Anginal pain ([place])   . Anxiety   . Arthritis    "all over my body"   . Chronic bronchitis ([place])    "get it just about q yr" (06/14/2015)  . Chronic lower back pain   . Dysrhythmia    patient unsure what it is but has been told that.  . Exertional shortness of breath   . Frequent UTI    "recently" (06/14/2015)  . GERD (gastroesophageal reflux disease)   . Headache(784.0)    "weekly; wake up w/them " (110/19/2018)  . Heart murmur   . High cholesterol   . History of blood transfusion ~ 1954   "related to my periods"  . Hypertension   . Migraine    "once/month maybe" (05/16/2017)  . OSA (obstructive sleep apnea)    "I think I'm using a BiPAP" (06/14/2015)  . Type II diabetes mellitus (HCC)     Tobacco History: Social History   Tobacco Use  Smoking Status Former Smoker  . Types: Cigarettes  Smokeless Tobacco Never Used  Tobacco Comment   05/16/2017"1 pack of cigarettes would last 3-4 months; haven't had any cigarettes for 40 years"   Counseling given: Not Answered Comment: 05/16/2017"1 pack of cigarettes would last 3-4 months; haven't had any cigarettes for 40 years"   Outpatient Encounter Medications as of 06/08/2018  Medication Sig  . albuterol (ACCUNEB) 0.63 MG/3ML nebulizer solution Take 2 ampules by nebulization every 6 (six) hours as needed for wheezing.  Marland Kitchen atorvastatin (LIPITOR) 40 MG tablet Take 40 mg by mouth at bedtime.  . cetirizine (ZYRTEC) 10 MG tablet Take 10 mg by mouth daily  as needed for allergies.  Marland Kitchen dicyclomine (BENTYL) 20 MG tablet Take 20 mg by mouth 2 (two) times daily.  Marland Kitchen lisinopril-hydrochlorothiazide (PRINZIDE,ZESTORETIC) 10-12.5 MG tablet Take 1 tablet by mouth daily.  Marland Kitchen omeprazole (PRILOSEC) 20 MG capsule Take 20 mg by mouth 2 (two) times daily.  . traMADol (ULTRAM) 50 MG tablet Take 50 mg daily as needed by mouth for moderate pain.   Marland Kitchen aspirin EC 81 MG tablet Take 1 tablet (81 mg total) by mouth daily. (Patient not taking: Reported on 06/08/2018)  . budesonide-formoterol (SYMBICORT) 160-4.5  MCG/ACT inhaler Inhale 2 puffs into the lungs 2 (two) times daily.  Marland Kitchen docusate sodium (COLACE) 100 MG capsule Take 100 mg by mouth 2 (two) times daily as needed for mild constipation.  . fluticasone (FLONASE) 50 MCG/ACT nasal spray Place 2 sprays into both nostrils daily as needed for allergies or rhinitis.  . furosemide (LASIX) 40 MG tablet Take 1 tablet (40 mg total) by mouth daily for 10 days. (Patient not taking: Reported on 06/08/2018)  . hydroxychloroquine (PLAQUENIL) 200 MG tablet Take 200 mg by mouth 2 (two) times daily.  . methylPREDNISolone (MEDROL) 4 MG tablet Take 2 mg by mouth daily.   . sitaGLIPtin (JANUVIA) 100 MG tablet Take 100 mg by mouth daily.   No facility-administered encounter medications on file as of 06/08/2018.      Review of Systems  Review of Systems  Constitutional: Negative.  Negative for chills.  HENT: Negative.   Respiratory: Positive for shortness of breath. Negative for cough.   Cardiovascular: Negative.  Negative for chest pain, palpitations and leg swelling.  Gastrointestinal: Negative.   Allergic/Immunologic: Negative.   Neurological: Negative.   Psychiatric/Behavioral: Negative.        Physical Exam  BP (!) 140/94 (BP Location: Left Arm, Patient Position: Sitting, Cuff Size: Normal)   Pulse 98   Ht 5' 1.42" (1.56 m)   Wt 138 lb (62.6 kg)   SpO2 99%   BMI 25.72 kg/m   Wt Readings from Last 5 Encounters:  06/08/18 138 lb (62.6 kg)  06/01/18 138 lb (62.6 kg)  10/16/17 109 lb (49.4 kg)  09/22/17 109 lb (49.4 kg)  09/08/17 109 lb (49.4 kg)     Physical Exam  Constitutional: She is oriented to person, place, and time. She appears well-developed and well-nourished. No distress.  Cardiovascular: Normal rate and regular rhythm.  Pulmonary/Chest: Effort normal and breath sounds normal. No respiratory distress. She has no wheezes. She has no rales.  Musculoskeletal: She exhibits no edema.  Neurological: She is alert and oriented to person,  place, and time.  Psychiatric: She has a normal mood and affect.  Nursing note and vitals reviewed.   Imaging: Dg Chest 2 View  Result Date: 06/08/2018 CLINICAL DATA:  Shortness of breath over the last 3 months. Intermittent substernal chest pain. EXAM: CHEST - 2 VIEW COMPARISON:  05/15/2017 FINDINGS: Borderline cardiomegaly. Aortic atherosclerosis with tortuosity. The lungs are clear. No infiltrate, mass, collapse or effusion. No acute bone finding. Previous augmentation of 2 vertebral bodies at the thoracolumbar junction region. IMPRESSION: No active disease.  Mild cardiomegaly.  Aortic atherosclerosis. Electronically Signed   By: Nelson Chimes M.D.   On: 06/08/2018 11:10     Assessment & Plan:   COPD (chronic obstructive pulmonary disease) ([place]) Patient Instructions  Patient states that she could not tolerate lasix and lisinopril/HCTZ together. She became very dizzy and quit both medications Please continue lisinopril/HCTZ Low sodium diet  Stay active Will order  PFT with appointment directly after with Dr. Lake Bells. Follow up sooner if needed Will call with lab results Right hemidiaphragm still elevated - will order diaphragm fluoroscopy       Fenton Foy, NP 06/08/2018

## 2018-06-08 NOTE — Assessment & Plan Note (Signed)
Patient Instructions  Patient states that she could not tolerate lasix and lisinopril/HCTZ together. She became very dizzy and quit both medications Please continue lisinopril/HCTZ Low sodium diet  Stay active Will order PFT with appointment directly after with Dr. Lake Bells. Follow up sooner if needed Will call with lab results Right hemidiaphragm still elevated - will order diaphragm fluoroscopy

## 2018-06-09 LAB — PRO B NATRIURETIC PEPTIDE: NT-Pro BNP: 98 pg/mL (ref 0–738)

## 2018-06-10 ENCOUNTER — Ambulatory Visit (HOSPITAL_COMMUNITY): Payer: Self-pay

## 2018-06-15 ENCOUNTER — Encounter: Payer: Self-pay | Admitting: Acute Care

## 2018-06-15 ENCOUNTER — Ambulatory Visit (HOSPITAL_COMMUNITY)
Admission: RE | Admit: 2018-06-15 | Discharge: 2018-06-15 | Disposition: A | Discharge status: Home / Self Care | Payer: Medicare Other | Source: Ambulatory Visit | Attending: Nurse Practitioner | Admitting: Nurse Practitioner

## 2018-06-15 ENCOUNTER — Ambulatory Visit (INDEPENDENT_AMBULATORY_CARE_PROVIDER_SITE_OTHER): Payer: Medicare Other | Admitting: Acute Care

## 2018-06-15 DIAGNOSIS — I10 Essential (primary) hypertension: Secondary | ICD-10-CM

## 2018-06-15 DIAGNOSIS — I5031 Acute diastolic (congestive) heart failure: Secondary | ICD-10-CM | POA: Diagnosis not present

## 2018-06-15 DIAGNOSIS — J986 Disorders of diaphragm: Secondary | ICD-10-CM

## 2018-06-15 DIAGNOSIS — J410 Simple chronic bronchitis: Secondary | ICD-10-CM | POA: Diagnosis not present

## 2018-06-15 HISTORY — DX: Disorders of diaphragm: J98.6

## 2018-06-15 HISTORY — DX: Essential (primary) hypertension: I10

## 2018-06-15 NOTE — Patient Instructions (Addendum)
It is nice to see you today. Please continue taking you Lisinopril as you have been doing. Please check your BP when you feel your heart is racing, and keep it in a notebook. Please bring this to your next visit. Please continue using you Symbicort 2 puffs twice daily every day. This is your maintenance medication. Remember to rinse your mouth after use. Continue using your nebulizer medication as needed for breakthrough shortness of breath. Sniff Test today as scheduled at [facility] We will schedule you for PFT's in 1 month with follow up with Lake Bells or Judson Roch same day. Please contact office for sooner follow up if symptoms do not improve or worsen or seek emergency care

## 2018-06-15 NOTE — Assessment & Plan Note (Signed)
Elevated Right hemidiaphragm SNIFF test 06/15/2018>> Stable chronic mild elevation of the right hemidiaphragm, compatible with mild eventration. Plan: Monitor as needed Consider surgical consult if worsens

## 2018-06-15 NOTE — Progress Notes (Signed)
History of Present Illness [name] is a 82 y.o. female former smoker with  COPD followed by [name]  Synopsis: Referred in November 2019 for dyspnea, and for CXR with elevated right hemidiaphragm. She is a lifelong non-smoker. She was told in June 2018 that she had a diagnosis of diastolic heart failure while hospitalized for severe sepsis related to a UTI. She has a history of rheumatoid arthritis and takes Plaquenil   06/15/2018   Pt. Presents for follow up. She was seen for referral by [name] 06/01/2018 for dyspnea of about 10 years and  elevated right hemidiaphragm.Marland KitchenShe was prescribed lisinopril  HCTZ , and lasix 40 mg daily at that first visit for HTN, and suspected acute HF component to her dyspnea.. At follow up with Vicki Arms NP on 06/08/2018 she reported that these medications  made her dizzy and she stopped taking them after 3 days.She had not been checking her BP at home. Right hemidiaphragm was still elevated on CXR.She was told at that visit to continue her lisinopril HCTZ. She was to stop her lasix. She returns today stating she is better using just the lisinopril HCTZ. Her dizziness is resolved.Her BP is 124/70. She is using her Symbicort, which was prescribed by her PCP 2 puffs twice daily. She is also using ACCU Nebs that her PCP prescribed. She is using these about once daily.She states her breathing is a little better. She states she still has exertional dyspnea and notices that her HR increases when she gets up and moves around.She does have a blood pressure monitor at home. She is not documenting her BP.She states she is checking it though. She denies any swelling in her ankles.She states she does not have a cough. She denies fever, chest pain, orthopnea or hemoptysis.She denies cough  Test Results: CXR CXR 06/08/2018>> No active disease.  Mild cardiomegaly.  Aortic atherosclerosis.  Chest x-ray report from rheumatology office from October 2019 showed an  elevated right hemidiaphragm.   Echo: June 2018 echocardiogram showed a normal LVEF with moderate left ventricular hypertrophy, normal valves  06/15/2018>>  SNIFF test Normal muscular function of the diaphragms bilaterally. Stable chronic mild elevation of the right hemidiaphragm, compatible with mild eventration.    CBC Latest Ref Rng & Units 08/23/2017 06/11/2017 05/19/2017  WBC 4.0 - 10.5 K/uL 6.5 10.4 6.6  Hemoglobin 12.0 - 15.0 g/dL 12.5 12.5 10.8(L)  Hematocrit 36.0 - 46.0 % 37.2 38.3 32.4(L)  Platelets 150 - 400 K/uL 220 270 250    BMP Latest Ref Rng & Units 06/08/2018 08/23/2017 06/11/2017  Glucose 70 - 99 mg/dL 179(H) 87 117(H)  BUN 6 - 23 mg/dL 12 <5(L) 9  Creatinine 0.40 - 1.20 mg/dL 0.85 0.61 1.07(H)  Sodium 135 - 145 mEq/L 138 142 141  Potassium 3.5 - 5.1 mEq/L 3.7 3.6 3.0(L)  Chloride 96 - 112 mEq/L 98 108 98(L)  CO2 19 - 32 mEq/L 29 26 30   Calcium 8.4 - 10.5 mg/dL 10.0 8.1(L) 9.2    BNP    Component Value Date/Time   BNP 32.7 05/15/2017 1640    ProBNP    Component Value Date/Time   PROBNP 98 06/08/2018 1158   PROBNP 63.8 04/07/2014 2035    PFT No results found for: FEV1PRE, FEV1POST, FVCPRE, FVCPOST, TLC, DLCOUNC, PREFEV1FVCRT, PSTFEV1FVCRT  Dg Chest 2 View  Result Date: 06/08/2018 CLINICAL DATA:  Shortness of breath over the last 3 months. Intermittent substernal chest pain. EXAM: CHEST - 2 VIEW COMPARISON:  05/15/2017 FINDINGS:  Borderline cardiomegaly. Aortic atherosclerosis with tortuosity. The lungs are clear. No infiltrate, mass, collapse or effusion. No acute bone finding. Previous augmentation of 2 vertebral bodies at the thoracolumbar junction region. IMPRESSION: No active disease.  Mild cardiomegaly.  Aortic atherosclerosis. Electronically Signed   By: [name] M.D.   On: 06/08/2018 11:10   Dg Sniff Test  Result Date: 06/15/2018 CLINICAL DATA:  Elevated right diaphragm on chest radiograph. Chronic dyspnea. EXAM: CHEST FLUOROSCOPY  TECHNIQUE: Real-time fluoroscopic evaluation of the chest was performed. FLUOROSCOPY TIME:  Fluoroscopy Time:  0 minutes 18 seconds Radiation Exposure Index (if provided by the fluoroscopic device): 2.1 mGy Number of Acquired Spot Images: 0 COMPARISON:  06/08/2018 chest radiograph. FINDINGS: There is mild elevation of the right hemidiaphragm at rest, unchanged from prior chest radiograph. There is normal muscular function of the diaphragms bilaterally with deep breathing, cough and sniff maneuvers. IMPRESSION: Normal muscular function of the diaphragms bilaterally. Stable chronic mild elevation of the right hemidiaphragm, compatible with mild eventration. Electronically Signed   By: [name] M.D.   On: 06/15/2018 11:25     Past medical hx Past Medical History:  Diagnosis Date  . Allergy   . Anemia    "when I was young"  . Anginal pain ([place])   . Anxiety   . Arthritis    "all over my body"   . Chronic bronchitis ([place])    "get it just about q yr" (06/14/2015)  . Chronic lower back pain   . Dysrhythmia    patient unsure what it is but has been told that.  . Exertional shortness of breath   . Frequent UTI    "recently" (06/14/2015)  . GERD (gastroesophageal reflux disease)   . Headache(784.0)    "weekly; wake up w/them " (110/19/2018)  . Heart murmur   . High cholesterol   . History of blood transfusion ~ 1954   "related to my periods"  . Hypertension   . Migraine    "once/month maybe" (05/16/2017)  . OSA (obstructive sleep apnea)    "I think I'm using a BiPAP" (06/14/2015)  . Type II diabetes mellitus (HCC)      Social History   Tobacco Use  . Smoking status: Former Smoker    Types: Cigarettes  . Smokeless tobacco: Never Used  . Tobacco comment: 05/16/2017"1 pack of cigarettes would last 3-4 months; haven't had any cigarettes for 40 years"  Substance Use Topics  . Alcohol use: Yes    Comment: 05/16/2017 "used to drink beer in the 1990's; don't drink at all now"  . Drug  use: No    Ms.Rising reports that she has quit smoking. Her smoking use included cigarettes. She has never used smokeless tobacco. She reports that she drinks alcohol. She reports that she does not use drugs.  Tobacco Cessation: Former smoker , quit 40 years ago  Past surgical hx, Family hx, Social hx all reviewed.  Current Outpatient Medications on File Prior to Visit  Medication Sig  . albuterol (ACCUNEB) 0.63 MG/3ML nebulizer solution Take 2 ampules by nebulization every 6 (six) hours as needed for wheezing.  Marland Kitchen aspirin EC 81 MG tablet Take 1 tablet (81 mg total) by mouth daily.  Marland Kitchen atorvastatin (LIPITOR) 40 MG tablet Take 40 mg by mouth at bedtime.  . budesonide-formoterol (SYMBICORT) 160-4.5 MCG/ACT inhaler Inhale 2 puffs into the lungs 2 (two) times daily.  . cetirizine (ZYRTEC) 10 MG tablet Take 10 mg by mouth daily as needed for allergies.  Marland Kitchen  dicyclomine (BENTYL) 20 MG tablet Take 20 mg by mouth 2 (two) times daily.  Marland Kitchen docusate sodium (COLACE) 100 MG capsule Take 100 mg by mouth 2 (two) times daily as needed for mild constipation.  . fluticasone (FLONASE) 50 MCG/ACT nasal spray Place 2 sprays into both nostrils daily as needed for allergies or rhinitis.  . hydroxychloroquine (PLAQUENIL) 200 MG tablet Take 200 mg by mouth 2 (two) times daily.  Marland Kitchen lisinopril-hydrochlorothiazide (PRINZIDE,ZESTORETIC) 10-12.5 MG tablet Take 1 tablet by mouth daily.  . methylPREDNISolone (MEDROL) 4 MG tablet Take 2 mg by mouth daily.   Marland Kitchen omeprazole (PRILOSEC) 20 MG capsule Take 20 mg by mouth 2 (two) times daily.  . sitaGLIPtin (JANUVIA) 100 MG tablet Take 100 mg by mouth daily.  . traMADol (ULTRAM) 50 MG tablet Take 50 mg daily as needed by mouth for moderate pain.   . furosemide (LASIX) 40 MG tablet Take 1 tablet (40 mg total) by mouth daily for 10 days. (Patient not taking: Reported on 06/08/2018)   No current facility-administered medications on file prior to visit.      Allergies  Allergen  Reactions  . Lisinopril-Hydrochlorothiazide     Patient stated the medication made her feel dizzy, did not like hot it made her feel.    Review Of Systems:  Constitutional:   No  weight loss, night sweats,  Fevers, chills, fatigue, or  lassitude.  HEENT:   No headaches,  Difficulty swallowing,  Tooth/dental problems, or  Sore throat,                No sneezing, itching, ear ache, nasal congestion, post nasal drip,   CV:  No chest pain,  Orthopnea, PND, swelling in lower extremities, anasarca, dizziness, palpitations, syncope.   GI  No heartburn, indigestion, abdominal pain, nausea, vomiting, diarrhea, change in bowel habits, loss of appetite, bloody stools.   Resp: + shortness of breath with exertion none  at rest.  No excess mucus, no productive cough,  No non-productive cough,  No coughing up of blood.  No change in color of mucus.  No wheezing.  No chest wall deformity  Skin: no rash or lesions.  GU: no dysuria, change in color of urine, no urgency or frequency.  No flank pain, no hematuria   MS:  No joint pain or swelling.  No decreased range of motion.  No back pain.  Psych:  No change in mood or affect. No depression or anxiety.  No memory loss.   Vital Signs BP 124/70 (BP Location: Left Arm, Cuff Size: Normal)   Pulse 91   Ht 5' 1.42" (1.56 m)   Wt 137 lb 6.4 oz (62.3 kg)   SpO2 97%   BMI 25.61 kg/m    Physical Exam:  General- No distress,  A&Ox3, pleasant ENT: No sinus tenderness, TM clear, pale nasal mucosa, no oral exudate,no post nasal drip, no LAN Cardiac: S1, S2, regular rate and rhythm, no murmur, negative JVD Chest: No wheeze/ rales/ dullness; no accessory muscle use, no nasal flaring, no sternal retractions, diminished per bases R>L, no crackles on auscultation Abd.: Soft Non-tender, ND, BS +, Body mass index is 25.61 kg/m. Ext: No clubbing cyanosis, edema Neuro:  normal strength, cranial nerves intact Skin: No rashes, warm and dry Psych: normal mood  and behavior   Assessment/Plan  Acute diastolic heart failure (HCC) Prescribed Lasix 40 mg daily and  lisinopril HCTZ. Stopped taking after 3 days due to dizziness On 11/11 she was told to take  lisinopril HCTZ only She has been compliant with this JVD negative 11/18 No further dizziness BP controlled Has noticed HR racing when she gets anxious Plan: Please continue taking you Lisinopril HCTZ  as you have been doing. Please check your BP when you feel your heart is racing, and keep it in a notebook. Please bring this to your next visit. Will need BMET at follow up Follow up in 1 month with [name] Please contact office for sooner follow up if symptoms do not improve or worsen or seek emergency care   Essential hypertension Compliant with lisinopril HCTZ BP well controlled in the office 11/18 Plan: Please continue taking you Lisinopril HCTZ  as you have been doing. Please check your BP when you feel your heart is racing, and keep it in a notebook. Please bring this to your next visit. Will need BMET at follow up Continue low sodium diet Follow up in 1 month with [name] Please contact office for sooner follow up if symptoms do not improve or worsen or seek emergency care    COPD (chronic obstructive pulmonary disease) ([place]) Continues to complain of some exertional dyspnea which has improved since starting Symbicort . Uses her ACCU nebs prn for breakthrough shortness of breath Plan: Please continue using you Symbicort 2 puffs twice daily every day. This is your maintenance medication. Remember to rinse your mouth after use. Continue using your nebulizer medication as needed for breakthrough shortness of breath. Sniff Test today as scheduled at [facility] We will schedule you for PFT's in 1 month with follow up with [place] or Judson Roch same day. Please contact office for sooner follow up if symptoms do not improve or worsen or seek emergency care      Elevated  diaphragm Elevated Right hemidiaphragm SNIFF test 06/15/2018>> Stable chronic mild elevation of the right hemidiaphragm, compatible with mild eventration. Plan: Monitor as needed Consider surgical consult if worsens    Magdalen Spatz, NP 06/15/2018  9:40 PM

## 2018-06-15 NOTE — Assessment & Plan Note (Signed)
Continues to complain of some exertional dyspnea which has improved since starting Symbicort . Uses her ACCU nebs prn for breakthrough shortness of breath Plan: Please continue using you Symbicort 2 puffs twice daily every day. This is your maintenance medication. Remember to rinse your mouth after use. Continue using your nebulizer medication as needed for breakthrough shortness of breath. Sniff Test today as scheduled at [facility] We will schedule you for PFT's in 1 month with follow up with [name] or [name] same day. Please contact office for sooner follow up if symptoms do not improve or worsen or seek emergency care

## 2018-06-15 NOTE — Assessment & Plan Note (Addendum)
Compliant with lisinopril HCTZ BP well controlled in the office 11/18 Plan: Please continue taking you Lisinopril HCTZ  as you have been doing. Please check your BP when you feel your heart is racing, and keep it in a notebook. Please bring this to your next visit. Will need BMET at follow up Continue low sodium diet Follow up in 1 month with Dr. [place] Bells Please contact office for sooner follow up if symptoms do not improve or worsen or seek emergency care

## 2018-06-15 NOTE — Assessment & Plan Note (Signed)
Prescribed Lasix 40 mg daily and  lisinopril HCTZ. Stopped taking after 3 days due to dizziness On 11/11 she was told to take lisinopril HCTZ only She has been compliant with this JVD negative 11/18 No further dizziness BP controlled Has noticed HR racing when she gets anxious Plan: Please continue taking you Lisinopril HCTZ  as you have been doing. Please check your BP when you feel your heart is racing, and keep it in a notebook. Please bring this to your next visit. Will need BMET at follow up Follow up in 1 month with Dr. [place] Bells Please contact office for sooner follow up if symptoms do not improve or worsen or seek emergency care

## 2018-06-28 ENCOUNTER — Encounter (HOSPITAL_COMMUNITY): Payer: Self-pay

## 2018-06-28 ENCOUNTER — Emergency Department (HOSPITAL_COMMUNITY): Payer: Medicare Other

## 2018-06-28 ENCOUNTER — Emergency Department (HOSPITAL_COMMUNITY)
Admission: EM | Admit: 2018-06-28 | Discharge: 2018-06-28 | Disposition: A | Discharge status: Home / Self Care | Payer: Medicare Other | Attending: Emergency Medicine | Admitting: Emergency Medicine

## 2018-06-28 DIAGNOSIS — J449 Chronic obstructive pulmonary disease, unspecified: Secondary | ICD-10-CM | POA: Diagnosis not present

## 2018-06-28 DIAGNOSIS — I5032 Chronic diastolic (congestive) heart failure: Secondary | ICD-10-CM | POA: Diagnosis not present

## 2018-06-28 DIAGNOSIS — E119 Type 2 diabetes mellitus without complications: Secondary | ICD-10-CM | POA: Diagnosis not present

## 2018-06-28 DIAGNOSIS — I110 Hypertensive heart disease with heart failure: Secondary | ICD-10-CM | POA: Insufficient documentation

## 2018-06-28 DIAGNOSIS — K5900 Constipation, unspecified: Secondary | ICD-10-CM

## 2018-06-28 DIAGNOSIS — Z79899 Other long term (current) drug therapy: Secondary | ICD-10-CM | POA: Insufficient documentation

## 2018-06-28 DIAGNOSIS — Z7982 Long term (current) use of aspirin: Secondary | ICD-10-CM | POA: Diagnosis not present

## 2018-06-28 DIAGNOSIS — Z87891 Personal history of nicotine dependence: Secondary | ICD-10-CM | POA: Insufficient documentation

## 2018-06-28 DIAGNOSIS — R1030 Lower abdominal pain, unspecified: Secondary | ICD-10-CM | POA: Diagnosis present

## 2018-06-28 LAB — LIPASE, BLOOD: LIPASE: 69 U/L — AB (ref 11–51)

## 2018-06-28 LAB — CBC WITH DIFFERENTIAL/PLATELET
ABS IMMATURE GRANULOCYTES: 0.05 10*3/uL (ref 0.00–0.07)
BASOS PCT: 1 %
Basophils Absolute: 0.1 10*3/uL (ref 0.0–0.1)
EOS ABS: 0 10*3/uL (ref 0.0–0.5)
Eosinophils Relative: 1 %
HEMATOCRIT: 45 % (ref 36.0–46.0)
Hemoglobin: 14.6 g/dL (ref 12.0–15.0)
IMMATURE GRANULOCYTES: 1 %
LYMPHS ABS: 3.4 10*3/uL (ref 0.7–4.0)
Lymphocytes Relative: 43 %
MCH: 29.6 pg (ref 26.0–34.0)
MCHC: 32.4 g/dL (ref 30.0–36.0)
MCV: 91.3 fL (ref 80.0–100.0)
MONO ABS: 0.5 10*3/uL (ref 0.1–1.0)
Monocytes Relative: 6 %
NEUTROS ABS: 3.8 10*3/uL (ref 1.7–7.7)
NEUTROS PCT: 48 %
Platelets: 251 10*3/uL (ref 150–400)
RBC: 4.93 MIL/uL (ref 3.87–5.11)
RDW: 13.7 % (ref 11.5–15.5)
WBC: 7.8 10*3/uL (ref 4.0–10.5)
nRBC: 0 % (ref 0.0–0.2)

## 2018-06-28 LAB — COMPREHENSIVE METABOLIC PANEL
ALBUMIN: 3.8 g/dL (ref 3.5–5.0)
ALK PHOS: 42 U/L (ref 38–126)
ALT: 17 U/L (ref 0–44)
AST: 29 U/L (ref 15–41)
Anion gap: 13 (ref 5–15)
BILIRUBIN TOTAL: 0.3 mg/dL (ref 0.3–1.2)
BUN: 28 mg/dL — ABNORMAL HIGH (ref 8–23)
CO2: 23 mmol/L (ref 22–32)
Calcium: 9.7 mg/dL (ref 8.9–10.3)
Chloride: 105 mmol/L (ref 98–111)
Creatinine, Ser: 1.49 mg/dL — ABNORMAL HIGH (ref 0.44–1.00)
GFR calc Af Amer: 38 mL/min — ABNORMAL LOW (ref >60)
GFR, EST NON AFRICAN AMERICAN: 33 mL/min — AB (ref >60)
GLUCOSE: 132 mg/dL — AB (ref 70–99)
POTASSIUM: 3.9 mmol/L (ref 3.5–5.1)
Sodium: 141 mmol/L (ref 135–145)
TOTAL PROTEIN: 7.2 g/dL (ref 6.5–8.1)

## 2018-06-28 MED ORDER — IOPAMIDOL (ISOVUE-300) INJECTION 61%
INTRAVENOUS | Status: AC
  Filled 2018-06-28: qty 100

## 2018-06-28 MED ORDER — SODIUM CHLORIDE (PF) 0.9 % IJ SOLN
INTRAMUSCULAR | Status: AC
  Filled 2018-06-28: qty 50

## 2018-06-28 MED ORDER — IOHEXOL 300 MG/ML  SOLN: 80.0000 mL | Freq: Once | INTRAMUSCULAR | Status: DC | PRN

## 2018-06-28 MED ORDER — TRAMADOL HCL 50 MG PO TABS
50.0000 mg | ORAL_TABLET | Freq: Once | ORAL | Status: AC
  Administered 2018-06-28: 50 mg via ORAL
  Filled 2018-06-28: qty 1

## 2018-06-28 MED ORDER — POLYETHYLENE GLYCOL 3350 17 G PO PACK: 17.0000 g | PACK | Freq: Every day | ORAL | 0 refills | Status: AC

## 2018-06-28 MED ORDER — SODIUM CHLORIDE 0.9 % IV BOLUS
500.0000 mL | Freq: Once | INTRAVENOUS | Status: AC
  Administered 2018-06-28: 500 mL via INTRAVENOUS

## 2018-06-28 MED ORDER — IOPAMIDOL (ISOVUE-300) INJECTION 61%
80.0000 mL | Freq: Once | INTRAVENOUS | Status: AC | PRN
  Administered 2018-06-28: 80 mL via INTRAVENOUS

## 2018-06-28 NOTE — ED Triage Notes (Signed)
Patient c/o right shoulder pain about 2 months.Patient was given two prescriptions. Patient then started having dizziness after taking prescribed medication. Lisinopril and patient unsure of other medication.   Patient c/o of constipation. Patient took MOM Wednesday night with no relief.   Patient C/O nausea and generalized lower abdominal pain.   Patient C/O of difficulty breathing.

## 2018-06-28 NOTE — ED Provider Notes (Signed)
[facility] DEPT Provider Note   CSN: [id number] Arrival date & time: 06/28/18  2263     History   Chief Complaint Chief Complaint  Patient presents with  . Abdominal Pain    HPI [name] is a 82 y.o. female.  The history is provided by the patient.  Abdominal Pain   This is a recurrent problem. The current episode started more than 2 days ago. The problem occurs every several days. The problem has not changed since onset.Associated with: constipation  The pain is located in the generalized abdominal region. The quality of the pain is aching. The pain is at a severity of 3/10. The pain is moderate. Associated symptoms include constipation and arthralgias. Pertinent negatives include anorexia, fever, belching, diarrhea, flatus, hematochezia, melena, nausea, vomiting, dysuria, frequency, hematuria, headaches and myalgias. Nothing aggravates the symptoms. Nothing relieves the symptoms. Past workup does not include GI consult. Her past medical history is significant for GERD.    Past Medical History:  Diagnosis Date  . Allergy   . Anemia    "when I was young"  . Anginal pain ([place])   . Anxiety   . Arthritis    "all over my body"   . Chronic bronchitis ([place])    "get it just about q yr" (06/14/2015)  . Chronic lower back pain   . Dysrhythmia    patient unsure what it is but has been told that.  . Exertional shortness of breath   . Frequent UTI    "recently" (06/14/2015)  . GERD (gastroesophageal reflux disease)   . Headache(784.0)    "weekly; wake up w/them " (110/19/2018)  . Heart murmur   . High cholesterol   . History of blood transfusion ~ 1954   "related to my periods"  . Hypertension   . Migraine    "once/month maybe" (05/16/2017)  . OSA (obstructive sleep apnea)    "I think I'm using a BiPAP" (06/14/2015)  . Type II diabetes mellitus [facility])     Patient Active Problem List   Diagnosis Date Noted  . Acute diastolic heart  failure ([place]) 06/15/2018  . Essential hypertension 06/15/2018  . 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 Gastric mass   . Intractable nausea and vomiting 05/15/2017  . Sepsis secondary to UTI ([place])   . Uncontrolled type 2 diabetes mellitus with complication ([place])   . Acute renal failure ([place])   . Chronic diastolic CHF (congestive heart failure) ([place])   . E-coli UTI 01/09/2017  . Sepsis ([place]) 01/09/2017  . Constipation 01/09/2017  . COPD (chronic obstructive pulmonary disease) ([place]) 01/09/2017  . Multiple falls 06/14/2015  . Weight loss 06/14/2015  . Acute respiratory failure with hypoxia ([place]) 06/14/2015  . Orthostasis 06/14/2015  . Acute respiratory failure ([place]) 06/14/2015  . Elevated lactic acid level 01/22/2015  . 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 Chronic diastolic heart failure, NYHA class 1 ([place]) 01/22/2015  . Tachycardia with heart rate 100-120 beats per minute 04/08/2014  . DM (diabetes mellitus) ([place]) 04/08/2014  . Chest pain 04/07/2014  . Hyperglycemia 12/11/2012  . Syncope and collapse 12/11/2012  . Dehydration, moderate 12/11/2012  . Noncompliance 12/11/2012  . UTI (lower urinary tract infection) 12/11/2012  . Chronic steroid use 12/11/2012  . Rheumatoid arthritis ([place]) 12/11/2012    Past Surgical History:  Procedure Laterality Date  . APPENDECTOMY  1970   Archie Endo 12/12/2010  . BACK SURGERY    . [place]   Archie Endo 12/12/2010  . EUS  N/A 10/16/2017   Procedure: UPPER ENDOSCOPIC ULTRASOUND (EUS) RADIAL;  Surgeon: Milus Banister, MD;  Location: [facility];  Service: Endoscopy;  Laterality: N/A;  . FRACTURE SURGERY    . LAPAROSCOPIC CHOLECYSTECTOMY  1990's  . LUMBAR MICRODISCECTOMY Left 09/2006   L5-S1/notes 12/12/2010  . MULTIPLE EXTRACTIONS WITH ALVEOLOPLASTY Bilateral 06/13/2017   Procedure: MULTIPLE EXTRACTION WITH ALVEOLOPLASTY;  Surgeon: Diona Browner, DDS;  Location: [facility];  Service: Oral Surgery;  Laterality: Bilateral;  . MULTIPLE  TOOTH EXTRACTIONS    . ORIF SHOULDER FRACTURE Left ~ 2009   "put cement down in it"   . TONSILLECTOMY  1950's?  . TOTAL ABDOMINAL HYSTERECTOMY  1979   Archie Endo 12/12/2010     OB History   None      Home Medications    Prior to Admission medications   Medication Sig Start Date End Date Taking? Authorizing Provider  albuterol (ACCUNEB) 0.63 MG/3ML nebulizer solution Take 2 ampules by nebulization every 6 (six) hours as needed for wheezing.   Yes [provider]  aspirin EC 81 MG tablet Take 1 tablet (81 mg total) by mouth daily. 04/09/14  Yes Rizwan, Eunice Blase, MD  Aspirin-Caffeine (BC FAST PAIN RELIEF PO) Take 1 packet by mouth daily as needed (shoulder pain).   Yes [provider]  budesonide-formoterol (SYMBICORT) 160-4.5 MCG/ACT inhaler Inhale 2 puffs into the lungs 2 (two) times daily.   Yes [provider]  dicyclomine (BENTYL) 20 MG tablet Take 20 mg by mouth 2 (two) times daily. 10/13/17  Yes [provider]  diphenhydrAMINE (BENADRYL) 25 MG tablet Take 25 mg by mouth daily as needed for allergies.   Yes [provider]  hydroxychloroquine (PLAQUENIL) 200 MG tablet Take 200 mg by mouth 2 (two) times daily. 05/15/15  Yes [provider]  ibuprofen (ADVIL,MOTRIN) 800 MG tablet Take 800 mg by mouth daily as needed for pain. 06/27/18  Yes [provider]  lisinopril-hydrochlorothiazide (PRINZIDE,ZESTORETIC) 10-12.5 MG tablet Take 1 tablet by mouth daily. 06/01/18  Yes Juanito Doom, MD  methylPREDNISolone (MEDROL) 4 MG tablet Take 2 mg by mouth daily.    Yes [provider]  omeprazole (PRILOSEC) 20 MG capsule Take 20 mg by mouth 2 (two) times daily. 12/17/16  Yes [provider]  PROAIR HFA 108 (90 Base) MCG/ACT inhaler Inhale 2 puffs into the lungs 4 (four) times daily as needed for wheezing or shortness of breath. 06/11/18  Yes [provider]  Simethicone (GAS-X EXTRA STRENGTH PO) Take 180 mg by  mouth daily.   Yes [provider]  traMADol (ULTRAM) 50 MG tablet Take 50 mg daily as needed by mouth for moderate pain.  05/04/12  Yes Barbara Cower, MD  furosemide (LASIX) 40 MG tablet Take 1 tablet (40 mg total) by mouth daily for 10 days. Patient not taking: Reported on 06/08/2018 06/01/18 06/11/18  Juanito Doom, MD  polyethylene glycol (MIRALAX / GLYCOLAX) packet Take 17 g by mouth daily for 30 doses. 06/28/18 07/28/18  Lennice Sites, DO    Family History Family History  Problem Relation Age of Onset  . Hypertension Mother   . Heart attack Father   . Diabetes Mellitus II Sister   . Hypertension Sister   . Cancer - Other Sister   . Colon cancer Neg Hx     Social History Social History   Tobacco Use  . Smoking status: Former Smoker    Types: Cigarettes  . Smokeless tobacco: Never Used  . Tobacco comment:  05/16/2017"1 pack of cigarettes would last 3-4 months; haven't had any cigarettes for 40 years"  Substance Use Topics  . Alcohol use: Yes    Comment: 05/16/2017 "used to drink beer in the 1990's; don't drink at all now"  . Drug use: No     Allergies   Lisinopril-hydrochlorothiazide   Review of Systems Review of Systems  Constitutional: Negative for chills and fever.  HENT: Negative for ear pain and sore throat.   Eyes: Negative for pain and visual disturbance.  Respiratory: Negative for cough and shortness of breath.   Cardiovascular: Negative for chest pain and palpitations.  Gastrointestinal: Positive for abdominal pain and constipation. Negative for anorexia, diarrhea, flatus, hematochezia, melena, nausea and vomiting.  Genitourinary: Negative for decreased urine volume, dysuria, frequency, hematuria, vaginal bleeding and vaginal discharge.  Musculoskeletal: Positive for arthralgias. Negative for back pain and myalgias.  Skin: Negative for color change and rash.  Neurological: Negative for seizures, syncope and headaches.  All other systems  reviewed and are negative.    Physical Exam Updated Vital Signs  ED Triage Vitals  Enc Vitals Group     BP 06/28/18 0846 123/66     Pulse Rate 06/28/18 0846 (!) 115     Resp 06/28/18 0846 16     Temp 06/28/18 0846 97.6 F (36.4 C)     Temp Source 06/28/18 0846 Oral     SpO2 06/28/18 0846 100 %     Weight --      Height --      Head Circumference --      Peak Flow --      Pain Score 06/28/18 0848 8     Pain Loc --      Pain Edu? --      Excl. in [place]? --     Physical Exam  Constitutional: She appears well-developed and well-nourished. No distress.  HENT:  Head: Normocephalic and atraumatic.  Mouth/Throat: Oropharynx is clear and moist. No oropharyngeal exudate.  Eyes: Pupils are equal, round, and reactive to light. Conjunctivae and EOM are normal.  Neck: Neck supple.  Cardiovascular: Normal rate, regular rhythm, normal heart sounds and intact distal pulses.  No murmur heard. Pulmonary/Chest: Effort normal and breath sounds normal. No respiratory distress.  Abdominal: Soft. Normal appearance. There is generalized tenderness and tenderness in the epigastric area. There is no rigidity, no rebound, no guarding, no CVA tenderness, no tenderness at McBurney's point and negative Murphy's sign.  Musculoskeletal: She exhibits no edema.  Neurological: She is alert.  Skin: Skin is warm and dry. Capillary refill takes less than 2 seconds.  Psychiatric: She has a normal mood and affect.  Nursing note and vitals reviewed.    ED Treatments / Results  Labs (all labs ordered are listed, but only abnormal results are displayed) Labs Reviewed  COMPREHENSIVE METABOLIC PANEL - Abnormal; Notable for the following components:      Result Value   Glucose, Bld 132 (*)    BUN 28 (*)    Creatinine, Ser 1.49 (*)    GFR calc non Af Amer 33 (*)    GFR calc Af Amer 38 (*)    All other components within normal limits  LIPASE, BLOOD - Abnormal; Notable for the following components:   Lipase 69  (*)    All other components within normal limits  CBC WITH DIFFERENTIAL/PLATELET    EKG EKG Interpretation  Date/Time:  Sunday June 28 2018 09:33:26 EST Ventricular Rate:  96 PR Interval:  QRS Duration: 86 QT Interval:  346 QTC Calculation: 438 R Axis:   -52 Text Interpretation:  Sinus rhythm LAD, consider left anterior fascicular block Abnormal R-wave progression, late transition Baseline wander in lead(s) V6 No STEMI.  Confirmed by Nanda Quinton [phone]) on 06/28/2018 9:40:24 AM Also confirmed by Nanda Quinton [phone]), editor Philomena Doheny [phone])  on 06/28/2018 10:01:19 AM   Radiology Ct Abdomen Pelvis W Contrast  Result Date: 06/28/2018 CLINICAL DATA:  Abdominal distension, nausea and lower abdominal pain. Constipation. EXAM: CT ABDOMEN AND PELVIS WITH CONTRAST TECHNIQUE: Multidetector CT imaging of the abdomen and pelvis was performed using the standard protocol following bolus administration of intravenous contrast. CONTRAST:  5mL ISOVUE-300 IOPAMIDOL (ISOVUE-300) INJECTION 61% COMPARISON:  08/23/2017 CT abdomen/pelvis. FINDINGS: Lower chest: No significant pulmonary nodules or acute consolidative airspace disease. Hepatobiliary: Normal liver size. No liver mass. Cholecystectomy. Bile ducts are stable and within normal post cholecystectomy limits. Pancreas: Stable mild diffuse pancreatic ductal dilatation (4 mm diameter) back to 2007 CT, without pancreatic mass. Spleen: Normal size. No mass. Adrenals/Urinary Tract: Normal adrenals. Normal kidneys with no hydronephrosis and no renal mass. Normal bladder. Stomach/Bowel: There is a coarsely calcified 2.3 cm exophytic mass along lesser curvature adjacent to the gastric cardia (series 2/image 17), unchanged since 08/23/2017 CT, stable in size since 05/24/2006 CT (where was not calcified), compatible with a benign lesion. Otherwise normal nondistended stomach. Normal caliber small bowel with no small bowel wall thickening. Appendectomy. Normal  large bowel with no diverticulosis, large bowel wall thickening or pericolonic fat stranding. Mild colonic stool volume. Vascular/Lymphatic: Atherosclerotic nonaneurysmal abdominal aorta. Patent portal, splenic, hepatic and renal veins. No pathologically enlarged lymph nodes in the abdomen or pelvis. Reproductive: Status post hysterectomy, with no abnormal findings at the vaginal cuff. No adnexal mass. Other: No pneumoperitoneum, ascites or focal fluid collection. Musculoskeletal: No aggressive appearing focal osseous lesions. Stable vertebroplasty changes within the T11 and T12 vertebral compression fractures. Stable chronic mild to moderate L4 vertebral compression fracture. Severe lumbar spondylosis. IMPRESSION: 1. No acute abnormality. No evidence of bowel obstruction or acute bowel inflammation. Mild colonic stool volume. 2. Chronic calcified exophytic 2.3 cm mass in the proximal stomach is stable in size since 2007 CT, compatible with a benign lesion. 3. Chronic mild diffuse pancreatic ductal dilatation since 2007 CT without pancreatic mass. 4.  Aortic Atherosclerosis (ICD10-I70.0). Electronically Signed   By: Ilona Sorrel M.D.   On: 06/28/2018 11:36    Procedures Procedures (including critical care time)  Medications Ordered in ED Medications  iopamidol (ISOVUE-300) 61 % injection (has no administration in time range)  sodium chloride (PF) 0.9 % injection (has no administration in time range)  traMADol (ULTRAM) tablet 50 mg (has no administration in time range)  sodium chloride 0.9 % bolus 500 mL (500 mLs Intravenous New Bag/Given 06/28/18 0920)  iopamidol (ISOVUE-300) 61 % injection 80 mL (80 mLs Intravenous Contrast Given 06/28/18 1031)     Initial Impression / Assessment and Plan / ED Course  I have reviewed the triage vital signs and the nursing notes.  Pertinent labs & imaging results that were available during my care of the patient were reviewed by me and considered in my medical  decision making (see chart for details).     Elsbeth Yearick is an 82 year old female with history of hypertension, chronic abdominal pain, chronic shoulder pain who presents to the ED with constipation.  Patient with normal vitals.  No fever.  Patient states that she has not had a bowel movement  last several days that was normal for her.  Patient had EKG performed that showed sinus rhythm.  No ischemic changes.  Doubt cardiac process.  She has some mild tenderness on abdominal exam.  No peritonitis.  No urinary symptoms. Doubt UTI.  No fever, no chills.  Patient has used milk of magnesia without much relief.  Overall she is well-appearing.  Abdominal exam is fairly unremarkable.  We will get basic labs including CT abdomen and pelvis.  Patient given normal saline bolus.  Given tramadol for pain.  Patient with mild stool but no acute findings on CT of abdomen and pelvis.  Chronic changes to her pancreas but otherwise unremarkable work-up.  No significant electrolyte abnormality, kidney injury, leukocytosis.  Patient likely with constipation, chronic pain.  Recommend follow-up with primary care doctor for further pain management.  She states that she no longer has her prescription for tramadol.  Was given 1 dose of tramadol here but recommend that she follow-up with primary doctor for further prescription.  Given return precautions and discharged from ED in good condition.  This chart was dictated using voice recognition software.  Despite best efforts to proofread,  errors can occur which can change the documentation meaning.   Final Clinical Impressions(s) / ED Diagnoses   Final diagnoses:  Constipation, unspecified constipation type    ED Discharge Orders         Ordered    polyethylene glycol (MIRALAX / GLYCOLAX) packet  Daily     06/28/18 [place], [place], DO 06/28/18 1212

## 2018-06-28 NOTE — ED Notes (Signed)
Pt. Unable to urinate at this time. Will collect urine when pt. Voids. Nurses aware.

## 2018-07-20 ENCOUNTER — Ambulatory Visit: Payer: Self-pay | Admitting: Acute Care

## 2018-08-10 ENCOUNTER — Ambulatory Visit (INDEPENDENT_AMBULATORY_CARE_PROVIDER_SITE_OTHER): Payer: Medicare Other | Admitting: Emergency Medicine

## 2018-08-10 ENCOUNTER — Encounter: Payer: Self-pay | Admitting: Emergency Medicine

## 2018-08-10 DIAGNOSIS — J449 Chronic obstructive pulmonary disease, unspecified: Secondary | ICD-10-CM | POA: Diagnosis not present

## 2018-08-10 DIAGNOSIS — R0602 Shortness of breath: Secondary | ICD-10-CM | POA: Diagnosis not present

## 2018-08-10 DIAGNOSIS — R0600 Dyspnea, unspecified: Secondary | ICD-10-CM | POA: Insufficient documentation

## 2018-08-10 LAB — PULMONARY FUNCTION TEST
DL/VA % pred: 125 %
DL/VA: 5.71 ml/min/mmHg/L
DLCO COR % PRED: 62 %
DLCO COR: 13.58 ml/min/mmHg
DLCO UNC % PRED: 65 %
DLCO UNC: 14.06 ml/min/mmHg
FEF 25-75 POST: 1.45 L/s
FEF 25-75 Pre: 1.65 L/sec
FEF2575-%Change-Post: -12 %
FEF2575-%PRED-POST: 123 %
FEF2575-%Pred-Pre: 140 %
FEV1-%CHANGE-POST: -5 %
FEV1-%Pred-Post: 78 %
FEV1-%Pred-Pre: 82 %
FEV1-POST: 1.08 L
FEV1-Pre: 1.14 L
FEV1FVC-%Change-Post: 1 %
FEV1FVC-%Pred-Pre: 118 %
FEV6-%CHANGE-POST: -5 %
FEV6-%PRED-POST: 69 %
FEV6-%Pred-Pre: 73 %
FEV6-PRE: 1.26 L
FEV6-Post: 1.19 L
FEV6FVC-%Pred-Post: 105 %
FEV6FVC-%Pred-Pre: 105 %
FVC-%CHANGE-POST: -6 %
FVC-%Pred-Post: 65 %
FVC-%Pred-Pre: 70 %
FVC-POST: 1.19 L
FVC-Pre: 1.27 L
POST FEV1/FVC RATIO: 91 %
POST FEV6/FVC RATIO: 100 %
PRE FEV6/FVC RATIO: 100 %
Pre FEV1/FVC ratio: 89 %
RV % pred: 60 %
RV: 1.39 L
TLC % pred: 60 %
TLC: 2.86 L

## 2018-08-10 NOTE — Assessment & Plan Note (Signed)
Her pulmonary function testing does not show any evidence for significant obstruction, does show restriction.  This is in the setting of right hemi-diaphragmatic elevation (not paralysis) and an exophytic calcified gastric mass that is reportedly stable in size, benign.  No clear hiatal hernia.  I do not see any evidence for interstitial disease on her chest x-ray from 06/08/2018.    Difficult to say whether she missed the Symbicort when it was stopped, she does state that she has benefited from the albuterol that was prescribed by Dr Jeanie Cooks.  Based on the PFT I will not restart scheduled bronchodilator or ICS.  She has diastolic dysfunction and is on the lisinopril/HCTZ that was started by Dr. Lake Bells, is tolerating it.  Would continue

## 2018-08-10 NOTE — Progress Notes (Signed)
Subjective:    Patient ID: [name], female    DOB: 05-21-1936, 83 y.o.   MRN: [id number]  HPI This is an 83 year old never smoker who is been seen in our office by Dr. Lake Bells for exertional dyspnea.  She has a history of rheumatoid arthritis which has been treated with Plaquenil, also has hypertension and carries a history of diastolic dysfunction, obstructive sleep apnea (on BiPAP), diabetes, chronic low back pain.  She has been treated with Symbicort in the past, had discussed stopping this with Dr. Lake Bells - I think she did this, she has trouble remembering.  As best I can tell she is also stopped her albuterol nebs  She was treated here with lisinopril / HCTZ, short course lasix. She reports that she was also started on albuterol HFA by Dr Jeanie Cooks last month - she believe that she is benefiting from this. She denies any cough.   She underwent pulmonary function testing today that I reviewed.  This shows spirometry consistent with restriction without a bronchodilator response, restricted lung volumes, and a decreased diffusion capacity that adjusted the normal range for alveolar volume.  She also underwent a sniff test on 06/15/2018 to evaluate an elevated right hemidiaphragm.  This showed chronic elevation of the R HD but good diaphragmatic excursion-  inconsistent with paralysis.   Review of Systems  Past Medical History:  Diagnosis Date  . Allergy   . Anemia    "when I was young"  . Anginal pain ([place])   . Anxiety   . Arthritis    "all over my body"   . Chronic bronchitis ([place])    "get it just about q yr" (06/14/2015)  . Chronic lower back pain   . Dysrhythmia    patient unsure what it is but has been told that.  . Exertional shortness of breath   . Frequent UTI    "recently" (06/14/2015)  . GERD (gastroesophageal reflux disease)   . Headache(784.0)    "weekly; wake up w/them " (110/19/2018)  . Heart murmur   . High cholesterol   . History of blood transfusion ~  1954   "related to my periods"  . Hypertension   . Migraine    "once/month maybe" (05/16/2017)  . OSA (obstructive sleep apnea)    "I think I'm using a BiPAP" (06/14/2015)  . Type II diabetes mellitus ([facility])      Family History  Problem Relation Age of Onset  . Hypertension Mother   . Heart attack Father   . Diabetes Mellitus II Sister   . Hypertension Sister   . Cancer - Other Sister   . Colon cancer Neg Hx      Social History   Socioeconomic History  . Marital status: Widowed    Spouse name: Not on file  . Number of children: Not on file  . Years of education: Not on file  . Highest education level: Not on file  Occupational History  . Not on file  Social Needs  . Financial resource strain: Not on file  . Food insecurity:    Worry: Not on file    Inability: Not on file  . Transportation needs:    Medical: Not on file    Non-medical: Not on file  Tobacco Use  . Smoking status: Former Smoker    Types: Cigarettes  . Smokeless tobacco: Never Used  . Tobacco comment: 05/16/2017"1 pack of cigarettes would last 3-4 months; haven't had any cigarettes for 40 years"  Substance  and Sexual Activity  . Alcohol use: Yes    Comment: 05/16/2017 "used to drink beer in the 1990's; don't drink at all now"  . Drug use: No  . Sexual activity: Never    Birth control/protection: Abstinence  Lifestyle  . Physical activity:    Days per week: Not on file    Minutes per session: Not on file  . Stress: Not on file  Relationships  . Social connections:    Talks on phone: Not on file    Gets together: Not on file    Attends religious service: Not on file    Active member of club or organization: Not on file    Attends meetings of clubs or organizations: Not on file    Relationship status: Not on file  . Intimate partner violence:    Fear of current or ex partner: Not on file    Emotionally abused: Not on file    Physically abused: Not on file    Forced sexual activity: Not on  file  Other Topics Concern  . Not on file  Social History Narrative  . Not on file     Allergies  Allergen Reactions  . Lisinopril-Hydrochlorothiazide     Patient stated the medication made her feel dizzy, did not like hot it made her feel.     Outpatient Medications Prior to Visit  Medication Sig Dispense Refill  . albuterol (ACCUNEB) 0.63 MG/3ML nebulizer solution Take 2 ampules by nebulization every 6 (six) hours as needed for wheezing.    Marland Kitchen aspirin EC 81 MG tablet Take 1 tablet (81 mg total) by mouth daily.    . Aspirin-Caffeine (BC FAST PAIN RELIEF PO) Take 1 packet by mouth daily as needed (shoulder pain).    Marland Kitchen dicyclomine (BENTYL) 20 MG tablet Take 20 mg by mouth 2 (two) times daily.  1  . diphenhydrAMINE (BENADRYL) 25 MG tablet Take 25 mg by mouth daily as needed for allergies.    . hydroxychloroquine (PLAQUENIL) 200 MG tablet Take 200 mg by mouth 2 (two) times daily.  0  . ibuprofen (ADVIL,MOTRIN) 800 MG tablet Take 800 mg by mouth daily as needed for pain.    Marland Kitchen lisinopril-hydrochlorothiazide (PRINZIDE,ZESTORETIC) 10-12.5 MG tablet Take 1 tablet by mouth daily. 30 tablet 2  . methylPREDNISolone (MEDROL) 4 MG tablet Take 2 mg by mouth daily.     Marland Kitchen omeprazole (PRILOSEC) 20 MG capsule Take 20 mg by mouth 2 (two) times daily.  0  . PROAIR HFA 108 (90 Base) MCG/ACT inhaler Inhale 2 puffs into the lungs 4 (four) times daily as needed for wheezing or shortness of breath.  2  . Simethicone (GAS-X EXTRA STRENGTH PO) Take 180 mg by mouth daily.    . traMADol (ULTRAM) 50 MG tablet Take 50 mg daily as needed by mouth for moderate pain.     . budesonide-formoterol (SYMBICORT) 160-4.5 MCG/ACT inhaler Inhale 2 puffs into the lungs 2 (two) times daily.    . furosemide (LASIX) 40 MG tablet Take 1 tablet (40 mg total) by mouth daily for 10 days. (Patient not taking: Reported on 06/08/2018) 10 tablet 0   No facility-administered medications prior to visit.         Objective:   Physical  Exam Vitals:   08/10/18 1615  BP: 136/90  Pulse: (!) 110  SpO2: 98%  Weight: 138 lb (62.6 kg)  Height: 5\' 2"  (1.575 m)   Gen: Pleasant, well-nourished, in no distress,  normal affect  ENT:  No lesions,  mouth clear,  oropharynx clear, no postnasal drip  Neck: No JVD, no stridor  Lungs: No use of accessory muscles, no crackles or wheezing on normal respiration, no wheeze on forced expiration  Cardiovascular: RRR, heart sounds normal, no murmur or gallops, no peripheral edema  Musculoskeletal: No deformities, no cyanosis or clubbing  Neuro: alert, awake, non focal  Skin: Warm, no lesions or rash     Assessment & Plan:  Dyspnea Her pulmonary function testing does not show any evidence for significant obstruction, does show restriction.  This is in the setting of right hemi-diaphragmatic elevation (not paralysis) and an exophytic calcified gastric mass that is reportedly stable in size, benign.  No clear hiatal hernia.  I do not see any evidence for interstitial disease on her chest x-ray from 06/08/2018.    Difficult to say whether she missed the Symbicort when it was stopped, she does state that she has benefited from the albuterol that was prescribed by Dr Jeanie Cooks.  Based on the PFT I will not restart scheduled bronchodilator or ICS.  She has diastolic dysfunction and is on the lisinopril/HCTZ that was started by Dr. Lake Bells, is tolerating it.  Would continue  Baltazar Apo, MD, PhD 08/10/2018, 5:08 PM [facility] [phone] or if no answer [phone]

## 2018-08-10 NOTE — Patient Instructions (Addendum)
We will stay off Symbicort. You can continue to use albuterol 2 puffs up to 4 times a day if you need it for shortness of breath, chest tightness, coughing, wheezing Continue your lisinopril/HCTZ as you are taking it. Follow with Dr. Lake Bells in 6 months or sooner if you have any problems or questions

## 2018-08-10 NOTE — Progress Notes (Signed)
PFT done today. 

## 2018-08-13 ENCOUNTER — Ambulatory Visit (INDEPENDENT_AMBULATORY_CARE_PROVIDER_SITE_OTHER): Payer: Medicare Other | Admitting: Internal Medicine

## 2018-08-13 ENCOUNTER — Encounter: Payer: Self-pay | Admitting: Internal Medicine

## 2018-08-13 ENCOUNTER — Other Ambulatory Visit: Payer: Self-pay

## 2018-08-13 VITALS — BP 136/74 | HR 100 | Temp 98.6°F | Ht 62.0 in | Wt 136.0 lb

## 2018-08-13 DIAGNOSIS — J449 Chronic obstructive pulmonary disease, unspecified: Secondary | ICD-10-CM

## 2018-08-13 DIAGNOSIS — E118 Type 2 diabetes mellitus with unspecified complications: Secondary | ICD-10-CM | POA: Diagnosis not present

## 2018-08-13 DIAGNOSIS — IMO0002 Reserved for concepts with insufficient information to code with codable children: Secondary | ICD-10-CM

## 2018-08-13 DIAGNOSIS — I509 Heart failure, unspecified: Secondary | ICD-10-CM

## 2018-08-13 DIAGNOSIS — I110 Hypertensive heart disease with heart failure: Secondary | ICD-10-CM | POA: Diagnosis not present

## 2018-08-13 DIAGNOSIS — G44029 Chronic cluster headache, not intractable: Secondary | ICD-10-CM

## 2018-08-13 DIAGNOSIS — E1165 Type 2 diabetes mellitus with hyperglycemia: Secondary | ICD-10-CM

## 2018-08-13 DIAGNOSIS — I10 Essential (primary) hypertension: Secondary | ICD-10-CM

## 2018-08-13 LAB — POCT URINALYSIS DIPSTICK
Bilirubin, UA: NEGATIVE
Blood, UA: NEGATIVE
Glucose, UA: NEGATIVE
KETONES UA: NEGATIVE
Nitrite, UA: NEGATIVE
Protein, UA: NEGATIVE
Spec Grav, UA: <=1.005 — AB (ref 1.010–1.025)
Urobilinogen, UA: 0.2 E.U./dL
pH, UA: 5.5 (ref 5.0–8.0)

## 2018-08-13 LAB — POCT UA - MICROALBUMIN
Creatinine, POC: 100 mg/dL
Microalbumin Ur, POC: 10 mg/L

## 2018-08-13 NOTE — Progress Notes (Signed)
Subjective:     Patient ID: [name] , female    DOB: 1936-04-07 , 83 y.o.   MRN: [id number]   CC- establish care  HPI She is here to establish care with [place]. Has hx of HTN and DM. Has not been taking any DM med, but she does not know why. Her prior PCP has not mentioned to her she needed to get back on meds since she lost wt.  Her glucose runs from 41-199. Has not had any hypoglycemia.   Past Medical History:  Diagnosis Date  . Allergy   . Anemia    "when I was young"  . Anginal pain ([place])   . Anxiety   . Arthritis    "all over my body"   . Chronic bronchitis ([place])    "get it just about q yr" (06/14/2015)  . Chronic lower back pain   . Dysrhythmia    patient unsure what it is but has been told that.  . Exertional shortness of breath   . Frequent UTI    "recently" (06/14/2015)  . GERD (gastroesophageal reflux disease)   . Headache(784.0)    "weekly; wake up w/them " (110/19/2018)  . Heart murmur   . High cholesterol   . History of blood transfusion ~ 1954   "related to my periods"  . Hypertension   . Migraine    "once/month maybe" (05/16/2017)  . OSA (obstructive sleep apnea)    "I think I'm using a BiPAP" (06/14/2015)  . Type II diabetes mellitus (HCC)      Family History  Problem Relation Age of Onset  . Hypertension Mother   . Heart attack Father   . Diabetes Mellitus II Sister   . Hypertension Sister   . Cancer - Other Sister   . Colon cancer Neg Hx      Current Outpatient Medications:  .  albuterol (ACCUNEB) 0.63 MG/3ML nebulizer solution, Take 2 ampules by nebulization every 6 (six) hours as needed for wheezing., Disp: , Rfl:  .  aspirin EC 81 MG tablet, Take 1 tablet (81 mg total) by mouth daily., Disp: , Rfl:  .  Aspirin-Caffeine (BC FAST PAIN RELIEF PO), Take 1 packet by mouth daily as needed (shoulder pain)., Disp: , Rfl:  .  dicyclomine (BENTYL) 20 MG tablet, Take 20 mg by mouth 2 (two) times daily., Disp: , Rfl: 1 .  diphenhydrAMINE  (BENADRYL) 25 MG tablet, Take 25 mg by mouth daily as needed for allergies., Disp: , Rfl:  .  hydroxychloroquine (PLAQUENIL) 200 MG tablet, Take 200 mg by mouth 2 (two) times daily., Disp: , Rfl: 0 .  ibuprofen (ADVIL,MOTRIN) 800 MG tablet, Take 800 mg by mouth daily as needed for pain., Disp: , Rfl:  .  lisinopril-hydrochlorothiazide (PRINZIDE,ZESTORETIC) 10-12.5 MG tablet, Take 1 tablet by mouth daily., Disp: 30 tablet, Rfl: 2 .  methylPREDNISolone (MEDROL) 4 MG tablet, Take 2 mg by mouth daily. , Disp: , Rfl:  .  omeprazole (PRILOSEC) 20 MG capsule, Take 20 mg by mouth 2 (two) times daily., Disp: , Rfl: 0 .  PROAIR HFA 108 (90 Base) MCG/ACT inhaler, Inhale 2 puffs into the lungs 4 (four) times daily as needed for wheezing or shortness of breath., Disp: , Rfl: 2 .  Simethicone (GAS-X EXTRA STRENGTH PO), Take 180 mg by mouth daily., Disp: , Rfl:  .  traMADol (ULTRAM) 50 MG tablet, Take 50 mg daily as needed by mouth for moderate pain. , Disp: , Rfl:  .  furosemide (LASIX) 40 MG tablet, Take 1 tablet (40 mg total) by mouth daily for 10 days. (Patient not taking: Reported on 06/08/2018), Disp: 10 tablet, Rfl: 0   Allergies  Allergen Reactions  . Lisinopril-Hydrochlorothiazide     Patient stated the medication made her feel dizzy, did not like hot it made her feel.     Review of Systems  Constitutional: Negative for diaphoresis.  Respiratory: Positive for cough and shortness of breath. Negative for chest tightness.        Gets chest pains when very active and gets SOB as well. This is not new for her. She has never seen a cardiologist.   Cardiovascular: Positive for chest pain.  Gastrointestinal: Negative for abdominal pain.  Genitourinary: Negative for difficulty urinating.  Skin: Negative for wound.  Neurological: Positive for headaches. Negative for tremors, weakness and numbness.       Has them every day. Her prior PCP placed her on tramadol and is the only thing that has helped her. She  has never seen a neurologist for this.      Today's Vitals   08/13/18 1450  BP: 136/74  Pulse: 100  Temp: 98.6 F (37 C)  TempSrc: Oral  SpO2: 96%  Weight: 136 lb (61.7 kg)  Height: 5\' 2"  (1.575 m)   Body mass index is 24.87 kg/m.   Objective:  Physical Exam   Constitutional: She is oriented to person, place, and time. She appears well-developed and well-nourished. No distress.  HENT:  Head: Normocephalic and atraumatic.  Right Ear: External ear normal.  Left Ear: External ear normal.  Nose: Nose normal.  Eyes: Conjunctivae are normal. Right eye exhibits no discharge. Left eye exhibits no discharge. No scleral icterus.  Neck: Neck supple. No thyromegaly present.  No carotid bruits bilaterally  Cardiovascular: Normal rate and regular rhythm.  No murmur heard. Pulmonary/Chest: Effort normal and breath sounds normal. No respiratory distress.  Musculoskeletal: Normal range of motion. She exhibits no edema.  Lymphadenopathy:    She has no cervical adenopathy.  Neurological: She is alert and oriented to person, place, and time.  Skin: Skin is warm and dry. Capillary refill takes less than 2 seconds. No rash noted. She is not diaphoretic.  Psychiatric: She has a normal mood and affect. Her behavior is normal. Judgment and thought content normal.  Nursing note reviewed.  Assessment And Plan:   1. Uncontrolled type 2 diabetes mellitus with complication ([place])- diet controlled - Ambulatory referral to Ophthalmology - Hemoglobin A1c - POCT Urinalysis Dipstick (81002) - POCT UA - Microalbumin  2. Essential hypertension- chronic and stable. May continue same meds - Ambulatory referral to Cardiology - CMP14 + Anion Gap - CBC no Diff - Lipid Profile  3. Chronic congestive heart failure, unspecified heart failure type ([place])- chronic.  - Ambulatory referral to Cardiology  4. Chronic obstructive pulmonary disease, unspecified COPD type ([place])- chronic.  - Ambulatory referral to  Pulmonology  5. Chronic cluster headache, not intractable- chronic.  - Ambulatory referral to Neurology  FU in a few weeks for a physical.   Vicki Stillinger RODRIGUEZ-SOUTHWORTH, PA-C

## 2018-08-14 ENCOUNTER — Encounter: Payer: Self-pay | Admitting: Internal Medicine

## 2018-08-14 LAB — LIPID PANEL
CHOLESTEROL TOTAL: 368 mg/dL — AB (ref 100–199)
Chol/HDL Ratio: 5.3 ratio — ABNORMAL HIGH (ref 0.0–4.4)
HDL: 70 mg/dL (ref >39)
LDL CALC: 243 mg/dL — AB (ref 0–99)
TRIGLYCERIDES: 275 mg/dL — AB (ref 0–149)
VLDL Cholesterol Cal: 55 mg/dL — ABNORMAL HIGH (ref 5–40)

## 2018-08-14 LAB — CMP14 + ANION GAP
A/G RATIO: 1.4 (ref 1.2–2.2)
ALT: 9 IU/L (ref 0–32)
AST: 20 IU/L (ref 0–40)
Albumin: 4.2 g/dL (ref 3.5–4.7)
Alkaline Phosphatase: 50 IU/L (ref 39–117)
Anion Gap: 22 mmol/L — ABNORMAL HIGH (ref 10.0–18.0)
BUN/Creatinine Ratio: 9 — ABNORMAL LOW (ref 12–28)
BUN: 14 mg/dL (ref 8–27)
CALCIUM: 10.1 mg/dL (ref 8.7–10.3)
CO2: 17 mmol/L — ABNORMAL LOW (ref 20–29)
Chloride: 102 mmol/L (ref 96–106)
Creatinine, Ser: 1.49 mg/dL — ABNORMAL HIGH (ref 0.57–1.00)
GFR calc Af Amer: 38 mL/min/{1.73_m2} — ABNORMAL LOW (ref >59)
GFR, EST NON AFRICAN AMERICAN: 33 mL/min/{1.73_m2} — AB (ref >59)
GLUCOSE: 129 mg/dL — AB (ref 65–99)
Globulin, Total: 3.1 g/dL (ref 1.5–4.5)
POTASSIUM: 4.6 mmol/L (ref 3.5–5.2)
Sodium: 141 mmol/L (ref 134–144)
Total Protein: 7.3 g/dL (ref 6.0–8.5)

## 2018-08-14 LAB — CBC
HEMOGLOBIN: 13.1 g/dL (ref 11.1–15.9)
Hematocrit: 39.1 % (ref 34.0–46.6)
MCH: 29.6 pg (ref 26.6–33.0)
MCHC: 33.5 g/dL (ref 31.5–35.7)
MCV: 88 fL (ref 79–97)
Platelets: 244 10*3/uL (ref 150–450)
RBC: 4.43 x10E6/uL (ref 3.77–5.28)
RDW: 14.4 % (ref 11.7–15.4)
WBC: 9.7 10*3/uL (ref 3.4–10.8)

## 2018-08-14 LAB — HEMOGLOBIN A1C
ESTIMATED AVERAGE GLUCOSE: 160 mg/dL
Hgb A1c MFr Bld: 7.2 % — ABNORMAL HIGH (ref 4.8–5.6)

## 2018-08-18 ENCOUNTER — Other Ambulatory Visit: Payer: Self-pay | Admitting: Internal Medicine

## 2018-08-18 DIAGNOSIS — N289 Disorder of kidney and ureter, unspecified: Secondary | ICD-10-CM

## 2018-08-18 MED ORDER — PRAVASTATIN SODIUM 20 MG PO TABS: 20.0000 mg | ORAL_TABLET | Freq: Every evening | ORAL | 0 refills | Status: DC

## 2018-08-18 NOTE — Progress Notes (Signed)
BMP order placed and Pravachol sent.

## 2018-08-19 ENCOUNTER — Other Ambulatory Visit: Payer: Self-pay | Admitting: Internal Medicine

## 2018-08-19 NOTE — Progress Notes (Signed)
Cardiology Office Note   Date:  08/24/2018   ID:  [name], DOB 1935-10-16, MRN [id number]  PCP:  [name], PA-C  Cardiologist:   Jenkins Rouge, MD   No chief complaint on file.     History of Present Illness: [name] is a 83 y.o. female who presents for consultation regarding HTN and CHF. Referred by PA Rodriguez-Southworth.  She has not been on good meds for DM. A1c 7.2  She has chronic atypical pain in chest  And dyspnea with activity which seems functional She is a mild smoker with diagnosis of COPD. Not clear to me That she has had any clinical CHF. TTE done 01/09/17 showed EF 65-70% grade one diastolic function witch Is normal for her age group no significant valve disease   She says her HR has been high since birth    Past Medical History:  Diagnosis Date  . Acute diastolic heart failure ([place]) 06/15/2018  . Acute kidney injury ([place]) 01/22/2015  . Acute renal failure ([place])   . Acute respiratory failure with hypoxia ([place]) 06/14/2015  . Allergy   . Anemia    "when I was young"  . Anginal pain ([place])   . Anxiety   . Arthritis    "all over my body"   . Chest pain 04/07/2014  . Chronic bronchitis ([place])    "get it just about q yr" (06/14/2015)  . Chronic diastolic CHF (congestive heart failure) ([place])   . Chronic diastolic heart failure, NYHA class 1 ([place]) 01/22/2015  . Chronic lower back pain   . Chronic steroid use 12/11/2012  . Constipation 01/09/2017  . COPD (chronic obstructive pulmonary disease) ([place]) 01/09/2017  . Dehydration, moderate 12/11/2012  . 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 DM (diabetes mellitus) ([place]) 04/08/2014  . Dyspnea 08/10/2018  . Dysrhythmia    patient unsure what it is but has been told that.  . E-coli UTI 01/09/2017  . 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 Elevated lactic acid level 01/22/2015  . Essential hypertension 06/15/2018  . Exertional shortness of breath   . Frequent UTI    "recently" (06/14/2015)  .  Gastric mass   . GERD (gastroesophageal reflux disease)   . Headache(784.0)    "weekly; wake up w/them " (110/19/2018)  . Heart murmur   . High cholesterol   . History of blood transfusion ~ 1954   "related to my periods"  . Hyperglycemia 12/11/2012  . Hypertension   . Intractable nausea and vomiting 05/15/2017  . Migraine    "once/month maybe" (05/16/2017)  . Multiple falls 06/14/2015  . Noncompliance 12/11/2012  . Orthostasis 06/14/2015  . OSA (obstructive sleep apnea)    "I think I'm using a BiPAP" (06/14/2015)  . Sepsis ([place]) 01/09/2017  . Sepsis secondary to UTI ([place])   . Syncope and collapse 12/11/2012  . Tachycardia with heart rate 100-120 beats per minute 04/08/2014  . Type II diabetes mellitus ([place])   . Uncontrolled type 2 diabetes mellitus with complication ([place])   . UTI (lower urinary tract infection) 12/11/2012  . Weight loss 06/14/2015    Past Surgical History:  Procedure Laterality Date  . APPENDECTOMY  1970   Archie Endo 12/12/2010  . BACK SURGERY    . [place]   Archie Endo 12/12/2010  . EUS N/A 10/16/2017   Procedure: UPPER ENDOSCOPIC ULTRASOUND (EUS) RADIAL;  Surgeon: Milus Banister, MD;  Location: [facility];  Service: Endoscopy;  Laterality: N/A;  . FRACTURE SURGERY    .  LAPAROSCOPIC CHOLECYSTECTOMY  1990's  . LUMBAR MICRODISCECTOMY Left 09/2006   L5-S1/notes 12/12/2010  . MULTIPLE EXTRACTIONS WITH ALVEOLOPLASTY Bilateral 06/13/2017   Procedure: MULTIPLE EXTRACTION WITH ALVEOLOPLASTY;  Surgeon: Diona Browner, DDS;  Location: [facility];  Service: Oral Surgery;  Laterality: Bilateral;  . MULTIPLE TOOTH EXTRACTIONS    . ORIF SHOULDER FRACTURE Left ~ 2009   "put cement down in it"   . TONSILLECTOMY  1950's?  . TOTAL ABDOMINAL HYSTERECTOMY  1979   Archie Endo 12/12/2010     Current Outpatient Medications  Medication Sig Dispense Refill  . albuterol (ACCUNEB) 0.63 MG/3ML nebulizer solution Take 2 ampules by nebulization every 6 (six) hours as needed  for wheezing.    Marland Kitchen aspirin EC 81 MG tablet Take 1 tablet (81 mg total) by mouth daily.    . Aspirin-Caffeine (BC FAST PAIN RELIEF PO) Take 1 packet by mouth daily as needed (shoulder pain).    Marland Kitchen dicyclomine (BENTYL) 20 MG tablet Take 20 mg by mouth 2 (two) times daily.  1  . hydroxychloroquine (PLAQUENIL) 200 MG tablet Take 200 mg by mouth 2 (two) times daily.  0  . ibuprofen (ADVIL,MOTRIN) 800 MG tablet Take 800 mg by mouth daily as needed for pain.    Marland Kitchen JANUVIA 100 MG tablet TAKE 1 TABLET BY MOUTH EVERY DAY 90 tablet 0  . methylPREDNISolone (MEDROL) 4 MG tablet Take 2 mg by mouth daily.     Marland Kitchen omeprazole (PRILOSEC) 20 MG capsule Take 20 mg by mouth 2 (two) times daily.  0  . pravastatin (PRAVACHOL) 20 MG tablet Take 1 tablet (20 mg total) by mouth every evening. 90 tablet 0  . PROAIR HFA 108 (90 Base) MCG/ACT inhaler Inhale 2 puffs into the lungs 4 (four) times daily as needed for wheezing or shortness of breath.  2  . Simethicone (GAS-X EXTRA STRENGTH PO) Take 180 mg by mouth daily.    . traMADol (ULTRAM) 50 MG tablet Take 50 mg daily as needed by mouth for moderate pain.      No current facility-administered medications for this visit.     Allergies:   Lisinopril-hydrochlorothiazide    Social History:  The patient  reports that she has quit smoking. Her smoking use included cigarettes. She has never used smokeless tobacco. She reports previous alcohol use. She reports that she does not use drugs.   Family History:  The patient's family history includes Cancer - Other in her sister; Diabetes Mellitus II in her sister; Heart attack in her father; Hypertension in her mother and sister.    ROS:  Please see the history of present illness.   Otherwise, review of systems are positive for none.   All other systems are reviewed and negative.    PHYSICAL EXAM: VS:  BP 128/80   Pulse 87   Ht 5\' 2"  (1.575 m)   Wt 135 lb (61.2 kg)   BMI 24.69 kg/m  , BMI Body mass index is 24.69  kg/m. Affect appropriate Healthy:  appears stated age 21: normal Neck supple with no adenopathy JVP normal no bruits no thyromegaly Lungs clear with no wheezing and good diaphragmatic motion Heart:  S1/S2 no murmur, no rub, gallop or click PMI normal Abdomen: benighn, BS positve, no tenderness, no AAA no bruit.  No HSM or HJR Distal pulses intact with no bruits No edema Neuro non-focal Skin warm and dry No muscular weakness    EKG:  06/30/18 SR rate 96 LAD normal ST segments    Recent  Labs: 06/08/2018: NT-Pro BNP 98 08/13/2018: ALT 9; BUN 14; Creatinine, Ser 1.49; Hemoglobin 13.1; Platelets 244; Potassium 4.6; Sodium 141    Lipid Panel    Component Value Date/Time   CHOL 368 (H) 08/13/2018 1606   TRIG 275 (H) 08/13/2018 1606   HDL 70 08/13/2018 1606   CHOLHDL 5.3 (H) 08/13/2018 1606   CHOLHDL 4.2 Ratio 07/02/2010 2140   VLDL 45 (H) 07/02/2010 2140   LDLCALC 243 (H) 08/13/2018 1606      Wt Readings from Last 3 Encounters:  08/24/18 135 lb (61.2 kg)  08/13/18 136 lb (61.7 kg)  08/10/18 138 lb (62.6 kg)      Other studies Reviewed: Additional studies/ records that were reviewed today include: Notes from primary ECG, TTE labs .    ASSESSMENT AND PLAN:  1.  HTN:  Well controlled.  Continue current medications and low sodium Dash type diet.   2. DM: Discussed low carb diet.  Target hemoglobin A1c is 6.5 or less.  Continue current medications. 3. Diastolic Dysfunction: not clinical primary Rx is weight loss and good Rx for HTN and DM which should be done by primary care physician will order TTE but doubt she will have anything but grade one diastolic if that consistent with age    Current medicines are reviewed at length with the patient today.  The patient does not have concerns regarding medicines.  The following changes have been made:  no change  Labs/ tests ordered today include: TTE   Orders Placed This Encounter  Procedures  . ECHOCARDIOGRAM  COMPLETE     Disposition:   FU with cardiology PRN      Signed, Jenkins Rouge, MD  08/24/2018 9:36 AM    [facility] [address], [place], [place]  [address] Phone: [phone]; Fax: [phone]

## 2018-08-24 ENCOUNTER — Ambulatory Visit (INDEPENDENT_AMBULATORY_CARE_PROVIDER_SITE_OTHER): Payer: Medicare Other | Admitting: Cardiovascular Disease

## 2018-08-24 VITALS — BP 128/80 | HR 87 | Ht 62.0 in | Wt 135.0 lb

## 2018-08-24 DIAGNOSIS — I509 Heart failure, unspecified: Secondary | ICD-10-CM

## 2018-08-24 NOTE — Patient Instructions (Signed)

## 2018-08-31 ENCOUNTER — Other Ambulatory Visit: Payer: Self-pay | Admitting: Internal Medicine

## 2018-08-31 ENCOUNTER — Other Ambulatory Visit (HOSPITAL_COMMUNITY): Payer: Self-pay

## 2018-08-31 NOTE — Progress Notes (Unsigned)
Please call pt and inform her that I read her cardiology's note and he recommends for her DM to have better control, and when I consulted with Dr Baird Cancer she recommends that due to her heart failure, we need to get her off the Metformin and have her on Trulicity  Or Ozempic weekly injection. Please ask if she is willing to do this, if so she needs AM visit.

## 2018-09-07 ENCOUNTER — Ambulatory Visit (HOSPITAL_COMMUNITY): Payer: Medicare Other | Attending: Cardiology

## 2018-09-07 DIAGNOSIS — I509 Heart failure, unspecified: Secondary | ICD-10-CM | POA: Diagnosis present

## 2018-09-10 ENCOUNTER — Encounter: Payer: Self-pay | Admitting: Internal Medicine

## 2018-09-10 ENCOUNTER — Ambulatory Visit (INDEPENDENT_AMBULATORY_CARE_PROVIDER_SITE_OTHER): Payer: Medicare Other | Admitting: Internal Medicine

## 2018-09-10 ENCOUNTER — Other Ambulatory Visit: Payer: Self-pay | Admitting: Internal Medicine

## 2018-09-10 ENCOUNTER — Ambulatory Visit (INDEPENDENT_AMBULATORY_CARE_PROVIDER_SITE_OTHER): Payer: Medicare Other

## 2018-09-10 VITALS — BP 144/88 | HR 100 | Temp 98.4°F | Ht 62.0 in | Wt 138.0 lb

## 2018-09-10 DIAGNOSIS — M05741 Rheumatoid arthritis with rheumatoid factor of right hand without organ or systems involvement: Secondary | ICD-10-CM

## 2018-09-10 DIAGNOSIS — J42 Unspecified chronic bronchitis: Secondary | ICD-10-CM | POA: Diagnosis not present

## 2018-09-10 DIAGNOSIS — N179 Acute kidney failure, unspecified: Secondary | ICD-10-CM | POA: Diagnosis not present

## 2018-09-10 DIAGNOSIS — E1165 Type 2 diabetes mellitus with hyperglycemia: Secondary | ICD-10-CM

## 2018-09-10 DIAGNOSIS — M05742 Rheumatoid arthritis with rheumatoid factor of left hand without organ or systems involvement: Secondary | ICD-10-CM

## 2018-09-10 DIAGNOSIS — I110 Hypertensive heart disease with heart failure: Secondary | ICD-10-CM

## 2018-09-10 DIAGNOSIS — R140 Abdominal distension (gaseous): Secondary | ICD-10-CM

## 2018-09-10 DIAGNOSIS — I10 Essential (primary) hypertension: Secondary | ICD-10-CM

## 2018-09-10 DIAGNOSIS — E878 Other disorders of electrolyte and fluid balance, not elsewhere classified: Secondary | ICD-10-CM

## 2018-09-10 DIAGNOSIS — I5032 Chronic diastolic (congestive) heart failure: Secondary | ICD-10-CM | POA: Diagnosis not present

## 2018-09-10 DIAGNOSIS — Z0000 Encounter for general adult medical examination without abnormal findings: Secondary | ICD-10-CM | POA: Diagnosis not present

## 2018-09-10 DIAGNOSIS — E118 Type 2 diabetes mellitus with unspecified complications: Secondary | ICD-10-CM

## 2018-09-10 DIAGNOSIS — IMO0002 Reserved for concepts with insufficient information to code with codable children: Secondary | ICD-10-CM

## 2018-09-10 DIAGNOSIS — J9601 Acute respiratory failure with hypoxia: Secondary | ICD-10-CM

## 2018-09-10 DIAGNOSIS — Z0001 Encounter for general adult medical examination with abnormal findings: Secondary | ICD-10-CM

## 2018-09-10 DIAGNOSIS — N289 Disorder of kidney and ureter, unspecified: Secondary | ICD-10-CM

## 2018-09-10 LAB — POCT URINALYSIS DIPSTICK
Bilirubin, UA: NEGATIVE
Blood, UA: NEGATIVE
Glucose, UA: NEGATIVE
Ketones, UA: NEGATIVE
Leukocytes, UA: NEGATIVE
Nitrite, UA: POSITIVE
PH UA: 6 (ref 5.0–8.0)
Protein, UA: NEGATIVE
Spec Grav, UA: 1.015 (ref 1.010–1.025)
UROBILINOGEN UA: 0.2 U/dL

## 2018-09-10 LAB — POCT UA - MICROALBUMIN
Creatinine, POC: 50 mg/dL
Microalbumin Ur, POC: 10 mg/L

## 2018-09-10 MED ORDER — BUDESONIDE-FORMOTEROL FUMARATE 160-4.5 MCG/ACT IN AERO: 2.0000 | INHALATION_SPRAY | Freq: Two times a day (BID) | RESPIRATORY_TRACT | 2 refills | Status: DC

## 2018-09-10 NOTE — Progress Notes (Signed)
Subjective:   Naeema Patlan is a 83 y.o. female who presents for Medicare Annual (Subsequent) preventive examination.  Review of Systems:  n/a Cardiac Risk Factors include: advanced age (>60men, >76 women);diabetes mellitus;hypertension;sedentary lifestyle     Objective:     Vitals: BP (!) 144/88 (BP Location: Left Arm, Patient Position: Sitting)   Pulse 100   Temp 98.4 F (36.9 C) (Oral)   Ht 5\' 2"  (1.575 m)   Wt 138 lb (62.6 kg)   SpO2 96%   BMI 25.24 kg/m   Body mass index is 25.24 kg/m.  Advanced Directives 09/10/2018 06/28/2018 10/16/2017 08/23/2017 06/13/2017 06/11/2017 05/16/2017  Does Patient Have a Medical Advance Directive? Yes No No No No No No  Type of Paramedic of [place];Living will - - - - - -  Does patient want to make changes to medical advance directive? - - - - - - -  Copy of [place] in Chart? No - copy requested - - - - - -  Would patient like information on creating a medical advance directive? - No - Patient declined No - Patient declined No - Patient declined - No - Patient declined Yes (Inpatient - patient defers creating a medical advance directive at this time)    Tobacco Social History   Tobacco Use  Smoking Status Former Smoker  . Types: Cigarettes  Smokeless Tobacco Never Used  Tobacco Comment   05/16/2017"1 pack of cigarettes would last 3-4 months; haven't had any cigarettes for 40 years"     Counseling given: Not Answered Comment: 05/16/2017"1 pack of cigarettes would last 3-4 months; haven't had any cigarettes for 40 years"   Clinical Intake:  Pre-visit preparation completed: Yes  Pain : No/denies pain Pain Score: 0-No pain     Nutritional Status: BMI 25 -29 Overweight Nutritional Risks: None Diabetes: Yes CBG done?: No Did pt. bring in CBG monitor from home?: No  How often do you need to have someone help you when you read instructions, pamphlets, or other written materials from  your doctor or pharmacy?: 4 - Often(family helps with reading things and filling out forms) What is the last grade level you completed in school?: 9th grade     Information entered by :: NAllen LPN  Past Medical History:  Diagnosis Date  . Acute diastolic heart failure ([place]) 06/15/2018  . Acute kidney injury ([place]) 01/22/2015  . Acute renal failure ([place])   . Acute respiratory failure with hypoxia ([place]) 06/14/2015  . Allergy   . Anemia    "when I was young"  . Anginal pain ([place])   . Anxiety   . Arthritis    "all over my body"   . Chest pain 04/07/2014  . Chronic bronchitis ([place])    "get it just about q yr" (06/14/2015)  . Chronic diastolic CHF (congestive heart failure) ([place])   . Chronic diastolic heart failure, NYHA class 1 ([place]) 01/22/2015  . Chronic lower back pain   . Chronic steroid use 12/11/2012  . Constipation 01/09/2017  . COPD (chronic obstructive pulmonary disease) ([place]) 01/09/2017  . Dehydration, moderate 12/11/2012  . 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 DM (diabetes mellitus) ([place]) 04/08/2014  . Dyspnea 08/10/2018  . Dysrhythmia    patient unsure what it is but has been told that.  . E-coli UTI 01/09/2017  . 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 Elevated lactic acid level 01/22/2015  . Essential hypertension 06/15/2018  . Exertional shortness of breath   . Frequent  UTI    "recently" (06/14/2015)  . Gastric mass   . GERD (gastroesophageal reflux disease)   . Headache(784.0)    "weekly; wake up w/them " (110/19/2018)  . Heart murmur   . High cholesterol   . History of blood transfusion ~ 1954   "related to my periods"  . Hyperglycemia 12/11/2012  . Hypertension   . Intractable nausea and vomiting 05/15/2017  . Migraine    "once/month maybe" (05/16/2017)  . Multiple falls 06/14/2015  . Noncompliance 12/11/2012  . Orthostasis 06/14/2015  . OSA (obstructive sleep apnea)    "I think I'm using a BiPAP" (06/14/2015)  . Sepsis ([place]) 01/09/2017  . Sepsis secondary to UTI  ([place])   . Syncope and collapse 12/11/2012  . Tachycardia with heart rate 100-120 beats per minute 04/08/2014  . Type II diabetes mellitus ([place])   . Uncontrolled type 2 diabetes mellitus with complication ([place])   . UTI (lower urinary tract infection) 12/11/2012  . Weight loss 06/14/2015   Past Surgical History:  Procedure Laterality Date  . APPENDECTOMY  1970   Archie Endo 12/12/2010  . BACK SURGERY    . [place]   Archie Endo 12/12/2010  . EUS N/A 10/16/2017   Procedure: UPPER ENDOSCOPIC ULTRASOUND (EUS) RADIAL;  Surgeon: Milus Banister, MD;  Location: [facility];  Service: Endoscopy;  Laterality: N/A;  . FRACTURE SURGERY    . LAPAROSCOPIC CHOLECYSTECTOMY  1990's  . LUMBAR MICRODISCECTOMY Left 09/2006   L5-S1/notes 12/12/2010  . MULTIPLE EXTRACTIONS WITH ALVEOLOPLASTY Bilateral 06/13/2017   Procedure: MULTIPLE EXTRACTION WITH ALVEOLOPLASTY;  Surgeon: Diona Browner, DDS;  Location: [facility];  Service: Oral Surgery;  Laterality: Bilateral;  . MULTIPLE TOOTH EXTRACTIONS    . ORIF SHOULDER FRACTURE Left ~ 2009   "put cement down in it"   . TONSILLECTOMY  1950's?  . TOTAL ABDOMINAL HYSTERECTOMY  1979   Archie Endo 12/12/2010   Family History  Problem Relation Age of Onset  . Hypertension Mother   . Heart attack Father   . Diabetes Mellitus II Sister   . Hypertension Sister   . Cancer - Other Sister   . Colon cancer Neg Hx    Social History   Socioeconomic History  . Marital status: Widowed    Spouse name: Not on file  . Number of children: Not on file  . Years of education: Not on file  . Highest education level: Not on file  Occupational History  . Occupation: retired  Scientific laboratory technician  . Financial resource strain: Not hard at all  . Food insecurity:    Worry: Never true    Inability: Never true  . Transportation needs:    Medical: No    Non-medical: No  Tobacco Use  . Smoking status: Former Smoker    Types: Cigarettes  . Smokeless tobacco: Never Used  . Tobacco  comment: 05/16/2017"1 pack of cigarettes would last 3-4 months; haven't had any cigarettes for 40 years"  Substance and Sexual Activity  . Alcohol use: Not Currently    Frequency: Never    Comment: 05/16/2017 "used to drink beer in the 1990's; don't drink at all now"  . Drug use: No  . Sexual activity: Not Currently    Birth control/protection: Abstinence  Lifestyle  . Physical activity:    Days per week: 0 days    Minutes per session: 0 min  . Stress: Not at all  Relationships  . Social connections:    Talks on phone: Not on file  Gets together: Not on file    Attends religious service: Not on file    Active member of club or organization: Not on file    Attends meetings of clubs or organizations: Not on file    Relationship status: Not on file  Other Topics Concern  . Not on file  Social History Narrative  . Not on file    Outpatient Encounter Medications as of 09/10/2018  Medication Sig  . albuterol (ACCUNEB) 0.63 MG/3ML nebulizer solution Take 2 ampules by nebulization every 6 (six) hours as needed for wheezing.  Marland Kitchen aspirin EC 81 MG tablet Take 1 tablet (81 mg total) by mouth daily.  . Aspirin-Caffeine (BC FAST PAIN RELIEF PO) Take 1 packet by mouth daily as needed (shoulder pain).  . budesonide-formoterol (SYMBICORT) 160-4.5 MCG/ACT inhaler Inhale 2 puffs into the lungs 2 (two) times daily.  Marland Kitchen dicyclomine (BENTYL) 20 MG tablet Take 20 mg by mouth 2 (two) times daily.  . hydroxychloroquine (PLAQUENIL) 200 MG tablet Take 200 mg by mouth 2 (two) times daily.  . hydrOXYzine (ATARAX/VISTARIL) 10 MG tablet Take 10 mg by mouth at bedtime as needed.  Marland Kitchen JANUVIA 100 MG tablet TAKE 1 TABLET BY MOUTH EVERY DAY  . methylPREDNISolone (MEDROL) 4 MG tablet Take 2 mg by mouth daily.   Marland Kitchen omeprazole (PRILOSEC) 20 MG capsule Take 20 mg by mouth 2 (two) times daily.  . pravastatin (PRAVACHOL) 20 MG tablet Take 1 tablet (20 mg total) by mouth every evening.  Marland Kitchen PROAIR HFA 108 (90 Base) MCG/ACT  inhaler Inhale 2 puffs into the lungs 4 (four) times daily as needed for wheezing or shortness of breath.  . Simethicone (GAS-X EXTRA STRENGTH PO) Take 180 mg by mouth daily.  Marland Kitchen ibuprofen (ADVIL,MOTRIN) 800 MG tablet Take 800 mg by mouth daily as needed for pain.  . traMADol (ULTRAM) 50 MG tablet Take 50 mg daily as needed by mouth for moderate pain.    No facility-administered encounter medications on file as of 09/10/2018.     Activities of Daily Living In your present state of health, do you have any difficulty performing the following activities: 09/10/2018  Hearing? N  Vision? N  Difficulty concentrating or making decisions? N  Walking or climbing stairs? Y  Comment some trouble  Dressing or bathing? N  Doing errands, shopping? N  Preparing Food and eating ? N  Using the Toilet? N  In the past six months, have you accidently leaked urine? N  Do you have problems with loss of bowel control? N  Managing your Medications? N  Managing your Finances? N  Housekeeping or managing your Housekeeping? N  Some recent data might be hidden    Patient Care Team: Rodriguez-[place], Sandrea Matte as PCP - General (Internal Medicine)    Assessment:   This is a routine wellness examination for Astrid.  Exercise Activities and Dietary recommendations Current Exercise Habits: The patient does not participate in regular exercise at present, Exercise limited by: None identified  Goals    . Patient Stated (pt-stated)     Just wants to keep on for her family.       Fall Risk Fall Risk  09/10/2018 08/13/2018  Falls in the past year? 1 0  Number falls in past yr: 0 -  Comment slipped in front of the store. It was raining -  Injury with Fall? 1 -  Comment broke left pinky finger -  Risk for fall due to : History of fall(s);Medication side effect -  Follow up Falls prevention discussed;Education provided -   Is the patient's home free of loose throw rugs in walkways, pet beds, electrical  cords, etc?   yes      Grab bars in the bathroom? no      Handrails on the stairs?   n/a      Adequate lighting?   yes  Timed Get Up and Go performed: n/a  Depression Screen PHQ 2/9 Scores 09/10/2018 08/13/2018  PHQ - 2 Score 0 0  PHQ- 9 Score 3 -     Cognitive Function     6CIT Screen 09/10/2018  What Year? 0 points  What month? 3 points  What time? 0 points  Count back from 20 2 points  Months in reverse 4 points  Repeat phrase 10 points  Total Score 19    Immunization History  Administered Date(s) Administered  . Influenza, High Dose Seasonal PF 05/18/2017  . Influenza,inj,Quad PF,6+ Mos 04/08/2014  . Influenza-Unspecified 04/14/2015, 04/28/2018  . Pneumococcal Polysaccharide-23 12/11/2012    Qualifies for Shingles Vaccine? yes  Screening Tests Health Maintenance  Topic Date Due  . FOOT EXAM  03/18/1946  . OPHTHALMOLOGY EXAM  03/18/1946  . TETANUS/TDAP  09/11/2019 (Originally 03/18/1955)  . PNA vac Low Risk Adult (2 of 2 - PCV13) 09/11/2019 (Originally 12/11/2013)  . HEMOGLOBIN A1C  02/11/2019  . URINE MICROALBUMIN  08/14/2019  . COLONOSCOPY  09/22/2020  . INFLUENZA VACCINE  Completed  . DEXA SCAN  Completed    Cancer Screenings: Lung: Low Dose CT Chest recommended if Age 48-80 years, 30 pack-year currently smoking OR have quit w/in 15years. Patient does not qualify. Breast:  Up to date on Mammogram? Yes   Up to date of Bone Density/Dexa? Yes Colorectal: not required  Additional Screenings: : Hepatitis C Screening: n/a     Plan:   Declines Prevnar 13 and TDAP.  States she will follow up with her niece's eye doctor for eye exam. States she is not interested in another bone density.  I have personally reviewed and noted the following in the patient's chart:   . Medical and social history . Use of alcohol, tobacco or illicit drugs  . Current medications and supplements . Functional ability and status . Nutritional status . Physical activity . Advanced  directives . List of other physicians . Hospitalizations, surgeries, and ER visits in previous 12 months . Vitals . Screenings to include cognitive, depression, and falls . Referrals and appointments  In addition, I have reviewed and discussed with patient certain preventive protocols, quality metrics, and best practice recommendations. A written personalized care plan for preventive services as well as general preventive health recommendations were provided to patient.     Kellie Simmering, LPN  03/05/8109

## 2018-09-10 NOTE — Patient Instructions (Signed)
[name] , Thank you for taking time to come for your Medicare Wellness Visit. I appreciate your ongoing commitment to your health goals. Please review the following plan we discussed and let me know if I can assist you in the future.   Screening recommendations/referrals: Colonoscopy: 08/2017 Mammogram: 09/2017 Bone Density: 12/2013 Recommended yearly ophthalmology/optometry visit for glaucoma screening and checkup Recommended yearly dental visit for hygiene and checkup  Vaccinations: Influenza vaccine: 04/2018 Pneumococcal vaccine: 11/2012 Tdap vaccine: decline Shingles vaccine: declines    Advanced directives: Please bring a copy of your POA (Power of Attorney) and/or Living Will to your next appointment.    Conditions/risks identified: Overweight  Next appointment: 09/10/2018   Preventive Care 47 Years and Older, Female Preventive care refers to lifestyle choices and visits with your health care provider that can promote health and wellness. What does preventive care include?  A yearly physical exam. This is also called an annual well check.  Dental exams once or twice a year.  Routine eye exams. Ask your health care provider how often you should have your eyes checked.  Personal lifestyle choices, including:  Daily care of your teeth and gums.  Regular physical activity.  Eating a healthy diet.  Avoiding tobacco and drug use.  Limiting alcohol use.  Practicing safe sex.  Taking low-dose aspirin every day.  Taking vitamin and mineral supplements as recommended by your health care provider. What happens during an annual well check? The services and screenings done by your health care provider during your annual well check will depend on your age, overall health, lifestyle risk factors, and family history of disease. Counseling  Your health care provider may ask you questions about your:  Alcohol use.  Tobacco use.  Drug use.  Emotional  well-being.  Home and relationship well-being.  Sexual activity.  Eating habits.  History of falls.  Memory and ability to understand (cognition).  Work and work Statistician.  Reproductive health. Screening  You may have the following tests or measurements:  Height, weight, and BMI.  Blood pressure.  Lipid and cholesterol levels. These may be checked every 5 years, or more frequently if you are over 17 years old.  Skin check.  Lung cancer screening. You may have this screening every year starting at age 27 if you have a 30-pack-year history of smoking and currently smoke or have quit within the past 15 years.  Fecal occult blood test (FOBT) of the stool. You may have this test every year starting at age 43.  Flexible sigmoidoscopy or colonoscopy. You may have a sigmoidoscopy every 5 years or a colonoscopy every 10 years starting at age 52.  Hepatitis C blood test.  Hepatitis B blood test.  Sexually transmitted disease (STD) testing.  Diabetes screening. This is done by checking your blood sugar (glucose) after you have not eaten for a while (fasting). You may have this done every 1-3 years.  Bone density scan. This is done to screen for osteoporosis. You may have this done starting at age 70.  Mammogram. This may be done every 1-2 years. Talk to your health care provider about how often you should have regular mammograms. Talk with your health care provider about your test results, treatment options, and if necessary, the need for more tests. Vaccines  Your health care provider may recommend certain vaccines, such as:  Influenza vaccine. This is recommended every year.  Tetanus, diphtheria, and acellular pertussis (Tdap, Td) vaccine. You may need a Td booster every 10  years.  Zoster vaccine. You may need this after age 27.  Pneumococcal 13-valent conjugate (PCV13) vaccine. One dose is recommended after age 36.  Pneumococcal polysaccharide (PPSV23) vaccine. One  dose is recommended after age 71. Talk to your health care provider about which screenings and vaccines you need and how often you need them. This information is not intended to replace advice given to you by your health care provider. Make sure you discuss any questions you have with your health care provider. Document Released: 08/11/2015 Document Revised: 04/03/2016 Document Reviewed: 05/16/2015 Elsevier Interactive Patient Education  2017 [place] Prevention in the Home Falls can cause injuries. They can happen to people of all ages. There are many things you can do to make your home safe and to help prevent falls. What can I do on the outside of my home?  Regularly fix the edges of walkways and driveways and fix any cracks.  Remove anything that might make you trip as you walk through a door, such as a raised step or threshold.  Trim any bushes or trees on the path to your home.  Use bright outdoor lighting.  Clear any walking paths of anything that might make someone trip, such as rocks or tools.  Regularly check to see if handrails are loose or broken. Make sure that both sides of any steps have handrails.  Any raised decks and porches should have guardrails on the edges.  Have any leaves, snow, or ice cleared regularly.  Use sand or salt on walking paths during winter.  Clean up any spills in your garage right away. This includes oil or grease spills. What can I do in the bathroom?  Use night lights.  Install grab bars by the toilet and in the tub and shower. Do not use towel bars as grab bars.  Use non-skid mats or decals in the tub or shower.  If you need to sit down in the shower, use a plastic, non-slip stool.  Keep the floor dry. Clean up any water that spills on the floor as soon as it happens.  Remove soap buildup in the tub or shower regularly.  Attach bath mats securely with double-sided non-slip rug tape.  Do not have throw rugs and other  things on the floor that can make you trip. What can I do in the bedroom?  Use night lights.  Make sure that you have a light by your bed that is easy to reach.  Do not use any sheets or blankets that are too big for your bed. They should not hang down onto the floor.  Have a firm chair that has side arms. You can use this for support while you get dressed.  Do not have throw rugs and other things on the floor that can make you trip. What can I do in the kitchen?  Clean up any spills right away.  Avoid walking on wet floors.  Keep items that you use a lot in easy-to-reach places.  If you need to reach something above you, use a strong step stool that has a grab bar.  Keep electrical cords out of the way.  Do not use floor polish or wax that makes floors slippery. If you must use wax, use non-skid floor wax.  Do not have throw rugs and other things on the floor that can make you trip. What can I do with my stairs?  Do not leave any items on the stairs.  Make sure that  there are handrails on both sides of the stairs and use them. Fix handrails that are broken or loose. Make sure that handrails are as long as the stairways.  Check any carpeting to make sure that it is firmly attached to the stairs. Fix any carpet that is loose or worn.  Avoid having throw rugs at the top or bottom of the stairs. If you do have throw rugs, attach them to the floor with carpet tape.  Make sure that you have a light switch at the top of the stairs and the bottom of the stairs. If you do not have them, ask someone to add them for you. What else can I do to help prevent falls?  Wear shoes that:  Do not have high heels.  Have rubber bottoms.  Are comfortable and fit you well.  Are closed at the toe. Do not wear sandals.  If you use a stepladder:  Make sure that it is fully opened. Do not climb a closed stepladder.  Make sure that both sides of the stepladder are locked into place.  Ask  someone to hold it for you, if possible.  Clearly mark and make sure that you can see:  Any grab bars or handrails.  First and last steps.  Where the edge of each step is.  Use tools that help you move around (mobility aids) if they are needed. These include:  Canes.  Walkers.  Scooters.  Crutches.  Turn on the lights when you go into a dark area. Replace any light bulbs as soon as they burn out.  Set up your furniture so you have a clear path. Avoid moving your furniture around.  If any of your floors are uneven, fix them.  If there are any pets around you, be aware of where they are.  Review your medicines with your doctor. Some medicines can make you feel dizzy. This can increase your chance of falling. Ask your doctor what other things that you can do to help prevent falls. This information is not intended to replace advice given to you by your health care provider. Make sure you discuss any questions you have with your health care provider. Document Released: 05/11/2009 Document Revised: 12/21/2015 Document Reviewed: 08/19/2014 Elsevier Interactive Patient Education  2017 [employer].

## 2018-09-10 NOTE — Progress Notes (Unsigned)
Please call pt and find out the dose of the lisinopril-hctz she is taking, and did she take it today before her visit? If yes, then we need to increase her dose from her current one. Also tell her she needed to have another kidney function test today, but I missed it. She can come any time Friday or Monday. I placed the order.  She will need FU for HTN in 2 weeks if we end up increasing her dose.

## 2018-09-10 NOTE — Progress Notes (Signed)
Subjective:     Patient ID: [name] , female    DOB: 1936-07-23 , 83 y.o.   MRN: [id number]   CC " physical and Medicare visit"   HPI Pt is here for medicare visit with LPN and complete physical. Has been dealing with increased bloating in the past month. States she had been getting constipated a month ago, but has been taking Miralax and this has helped. She has been waking up with abdominal bloating in the middle of the night and after getting up and walking it gets better. Has tried Gas-ex, but has not helped. She denies any changes in her diet, like increased roughage or more fruit in her diet. She is scheduled to have a mammogram in April. There was an area that has been watched via ultrasound of her R breast which they seemed to believe is benign but was recommended to have 6 month FU.     Past Medical History:  Diagnosis Date  . Acute diastolic heart failure ([place]) 06/15/2018  . Acute kidney injury ([place]) 01/22/2015  . Acute renal failure ([place])   . Acute respiratory failure with hypoxia ([place]) 06/14/2015  . Allergy   . Anemia    "when I was young"  . Anginal pain ([place])   . Anxiety   . Arthritis    "all over my body"   . Chest pain 04/07/2014  . Chronic bronchitis ([place])    "get it just about q yr" (06/14/2015)  . Chronic diastolic CHF (congestive heart failure) ([place])   . Chronic diastolic heart failure, NYHA class 1 ([place]) 01/22/2015  . Chronic lower back pain   . Chronic steroid use 12/11/2012  . Constipation 01/09/2017  . COPD (chronic obstructive pulmonary disease) ([place]) 01/09/2017  . Dehydration, moderate 12/11/2012  . 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 DM (diabetes mellitus) ([place]) 04/08/2014  . Dyspnea 08/10/2018  . Dysrhythmia    patient unsure what it is but has been told that.  . E-coli UTI 01/09/2017  . 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 Elevated lactic acid level 01/22/2015  . Essential hypertension 06/15/2018  . Exertional shortness of breath   . Frequent UTI     "recently" (06/14/2015)  . Gastric mass   . GERD (gastroesophageal reflux disease)   . Headache(784.0)    "weekly; wake up w/them " (110/19/2018)  . Heart murmur   . High cholesterol   . History of blood transfusion ~ 1954   "related to my periods"  . Hyperglycemia 12/11/2012  . Hypertension   . Intractable nausea and vomiting 05/15/2017  . Migraine    "once/month maybe" (05/16/2017)  . Multiple falls 06/14/2015  . Noncompliance 12/11/2012  . Orthostasis 06/14/2015  . OSA (obstructive sleep apnea)    "I think I'm using a BiPAP" (06/14/2015)  . Sepsis ([place]) 01/09/2017  . Sepsis secondary to UTI ([place])   . Syncope and collapse 12/11/2012  . Tachycardia with heart rate 100-120 beats per minute 04/08/2014  . Type II diabetes mellitus ([place])   . Uncontrolled type 2 diabetes mellitus with complication ([place])   . UTI (lower urinary tract infection) 12/11/2012  . Weight loss 06/14/2015     Family History  Problem Relation Age of Onset  . Hypertension Mother   . Heart attack Father   . Diabetes Mellitus II Sister   . Hypertension Sister   . Cancer - Other Sister   . Colon cancer Neg Hx      Current Outpatient Medications:  .  albuterol (ACCUNEB)  0.63 MG/3ML nebulizer solution, Take 2 ampules by nebulization every 6 (six) hours as needed for wheezing., Disp: , Rfl:  .  aspirin EC 81 MG tablet, Take 1 tablet (81 mg total) by mouth daily., Disp: , Rfl:  .  Aspirin-Caffeine (BC FAST PAIN RELIEF PO), Take 1 packet by mouth daily as needed (shoulder pain)., Disp: , Rfl:  .  budesonide-formoterol (SYMBICORT) 160-4.5 MCG/ACT inhaler, Inhale 2 puffs into the lungs 2 (two) times daily., Disp: , Rfl:  .  dicyclomine (BENTYL) 20 MG tablet, Take 20 mg by mouth 2 (two) times daily., Disp: , Rfl: 1 .  hydroxychloroquine (PLAQUENIL) 200 MG tablet, Take 200 mg by mouth 2 (two) times daily., Disp: , Rfl: 0 .  hydrOXYzine (ATARAX/VISTARIL) 10 MG tablet, Take 10 mg by mouth at bedtime as needed., Disp: ,  Rfl:  .  ibuprofen (ADVIL,MOTRIN) 800 MG tablet, Take 800 mg by mouth daily as needed for pain., Disp: , Rfl:  .  JANUVIA 100 MG tablet, TAKE 1 TABLET BY MOUTH EVERY DAY, Disp: 90 tablet, Rfl: 0 .  methylPREDNISolone (MEDROL) 4 MG tablet, Take 2 mg by mouth daily. , Disp: , Rfl:  .  omeprazole (PRILOSEC) 20 MG capsule, Take 20 mg by mouth 2 (two) times daily., Disp: , Rfl: 0 .  pravastatin (PRAVACHOL) 20 MG tablet, Take 1 tablet (20 mg total) by mouth every evening., Disp: 90 tablet, Rfl: 0 .  PROAIR HFA 108 (90 Base) MCG/ACT inhaler, Inhale 2 puffs into the lungs 4 (four) times daily as needed for wheezing or shortness of breath., Disp: , Rfl: 2 .  Simethicone (GAS-X EXTRA STRENGTH PO), Take 180 mg by mouth daily., Disp: , Rfl:  .  traMADol (ULTRAM) 50 MG tablet, Take 50 mg daily as needed by mouth for moderate pain. , Disp: , Rfl:    Allergies  Allergen Reactions  . Lisinopril-Hydrochlorothiazide     Patient stated the medication made her feel dizzy, did not like hot it made her feel.     Review of Systems  Constitutional: Negative for appetite change, chills, diaphoresis, fatigue, fever and unexpected weight change.  HENT: Negative for congestion, ear discharge, ear pain, sinus pressure, sinus pain, sore throat and voice change.   Eyes: Negative for discharge.  Respiratory: Positive for cough and shortness of breath. Negative for chest tightness and wheezing.        Gets occasional cough  Cardiovascular: Negative for chest pain, palpitations and leg swelling.  Gastrointestinal: Positive for abdominal distention and constipation. Negative for abdominal pain, blood in stool, diarrhea, nausea and vomiting.       Constipation off and on, but Myralax helps.   Endocrine: Negative for cold intolerance, heat intolerance, polydipsia and polyphagia.  Genitourinary: Negative for difficulty urinating, dysuria, frequency, urgency and vaginal bleeding.  Musculoskeletal: Positive for arthralgias,  joint swelling and neck stiffness. Negative for gait problem, myalgias and neck pain.  Skin: Negative.   Allergic/Immunologic: Negative for environmental allergies.  Neurological: Negative for dizziness, tremors, syncope, speech difficulty, weakness, light-headedness, numbness and headaches.  Psychiatric/Behavioral: Negative for agitation and behavioral problems. The patient is not nervous/anxious.        Denies depression   Rest is negative   Today's Vitals   09/10/18 1457  BP: (!) 144/88  Pulse: 100  Temp: 98.4 F (36.9 C)  TempSrc: Oral  Weight: 138 lb (62.6 kg)  Height: 5\' 2"  (1.575 m)   Body mass index is 25.24 kg/m.   Objective:  Physical Exam  BP (!) 144/88 (BP Location: Left Arm, Patient Position: Sitting, Cuff Size: Normal)   Pulse 100   Temp 98.4 F (36.9 C) (Oral)   Ht 5\' 2"  (1.575 m)   Wt 138 lb (62.6 kg)   BMI 25.24 kg/m   General Appearance:    Alert, cooperative, no distress, appears stated age  Head:    Normocephalic, without obvious abnormality, atraumatic  Eyes:    PERRL, conjunctiva/corneas clear, EOM's intact, fundi    benign, both eyes  Ears:    Normal TM's and external ear canals, both ears  Nose:   Nares normal, septum midline, mucosa normal, no drainage    or sinus tenderness  Throat:   Lips, mucosa, and tongue normal; teeth and gums normal  Neck:   Has stiffness with ROM, symmetrical, trachea midline, no adenopathy;    thyroid:  no enlargement/tenderness/nodules; no carotid   Bruit.  Back:     Symmetric, no curvature, ROM normal, no CVA tenderness  Lungs:     Clear to auscultation bilaterally, respirations unlabored  Chest Wall:    No tenderness or deformity   Heart:    Regular rate and rhythm, S1 and S2 normal, 1-2/6 murmur, rub   or gallop  Breast Exam:    No tenderness, masses, or nipple abnormality  Abdomen:     Soft, non-tender, bowel sounds active all four quadrants,    no masses, no organomegaly    Rectal-    Declined.       Extremities:   Hand joints are arthritis, not as bad on feet and toes, no cyanosis or edema. Cant supinate hands due to her arthritis.   Pulses:   2+ and symmetric all extremities  Skin:   Skin color, texture, turgor normal, no rashes or lesions  Lymph nodes:   Cervical, supraclavicular, and axillary nodes normal  Neurologic:   CNII-XII intact, normal strength, sensation and reflexes    Throughout. Rhomberg neg, normal nose to finger, tip toe and heel walk. Was unsteady trying to do tandem gait.    Assessment And Plan:    1. Encounter for general adult medical examination with abnormal findings- routine.  2. Rheumatoid arthritis involving both hands with positive rheumatoid factor ([place])- chronic. Will continue medrol 2 mg qd.   4. Essential hypertension- poorly controled. But did not take her BP med. Needs to take it consistently and FU in 2 weeks..   5. Chronic diastolic heart failure, NYHA class 1 ([place])- chronic. Last echo EF 60-65% 09/07/18   6. Chronic bronchitis, unspecified chronic bronchitis type ([place])- stable on Symbicort.   7. Acute renal failure, unspecified acute renal failure type ([place])- has stopped ibuprofen type meds. Needs BMP.   8. Abdominal bloating- I will have her try Anise tea and see if this helps prevent bloating.   9. Disequilibrium syndrome- chronic. No action.   I cant find her Dexa report in epic which I looked for after pt left, so I asked my MA Tianna to find out where pt had it for me to review it.   Kersten Salmons RODRIGUEZ-SOUTHWORTH, PA-C

## 2018-09-10 NOTE — Patient Instructions (Addendum)
GET ANISE SEEDS  OR ANISE TEA AND USE  1 TEASPOON INTO A CUP OF WATER, COVER THE CUP WITH  SOMETHING NAD LET IT STEEP FOR 15 MINUTES. DRINK IT 1-2 TIMES A DAY, SPECIALLY WITH EVENING MEAL TO SEE IF THIS HELPS PREVENT BLOATING AT NIGHT.    Preventive Care 30 Years and Older, Female Preventive care refers to lifestyle choices and visits with your health care provider that can promote health and wellness. What does preventive care include?  A yearly physical exam. This is also called an annual well check.  Dental exams once or twice a year.  Routine eye exams. Ask your health care provider how often you should have your eyes checked.  Personal lifestyle choices, including: ? Daily care of your teeth and gums. ? Regular physical activity. ? Eating a healthy diet. ? Avoiding tobacco and drug use. ? Limiting alcohol use. ? Practicing safe sex. ? Taking low-dose aspirin every day. ? Taking vitamin and mineral supplements as recommended by your health care provider. What happens during an annual well check? The services and screenings done by your health care provider during your annual well check will depend on your age, overall health, lifestyle risk factors, and family history of disease. Counseling Your health care provider may ask you questions about your:  Alcohol use.  Tobacco use.  Drug use.  Emotional well-being.  Home and relationship well-being.  Sexual activity.  Eating habits.  History of falls.  Memory and ability to understand (cognition).  Work and work Statistician.  Reproductive health.  Screening You may have the following tests or measurements:  Height, weight, and BMI.  Blood pressure.  Lipid and cholesterol levels. These may be checked every 5 years, or more frequently if you are over 53 years old.  Skin check.  Lung cancer screening. You may have this screening every year starting at age 71 if you have a 30-pack-year history of smoking and  currently smoke or have quit within the past 15 years.  Colorectal cancer screening. All adults should have this screening starting at age 34 and continuing until age 9. You will have tests every 1-10 years, depending on your results and the type of screening test. People at increased risk should start screening at an earlier age. Screening tests may include: ? Guaiac-based fecal occult blood testing. ? Fecal immunochemical test (FIT). ? Stool DNA test. ? Virtual colonoscopy. ? Sigmoidoscopy. During this test, a flexible tube with a tiny camera (sigmoidoscope) is used to examine your rectum and lower colon. The sigmoidoscope is inserted through your anus into your rectum and lower colon. ? Colonoscopy. During this test, a long, thin, flexible tube with a tiny camera (colonoscope) is used to examine your entire colon and rectum.  Hepatitis C blood test.  Hepatitis B blood test.  Sexually transmitted disease (STD) testing.  Diabetes screening. This is done by checking your blood sugar (glucose) after you have not eaten for a while (fasting). You may have this done every 1-3 years.  Bone density scan. This is done to screen for osteoporosis. You may have this done starting at age 64.  Mammogram. This may be done every 1-2 years. Talk to your health care provider about how often you should have regular mammograms. Talk with your health care provider about your test results, treatment options, and if necessary, the need for more tests. Vaccines Your health care provider may recommend certain vaccines, such as:  Influenza vaccine. This is recommended every year.  Tetanus, diphtheria, and acellular pertussis (Tdap, Td) vaccine. You may need a Td booster every 10 years.  Varicella vaccine. You may need this if you have not been vaccinated.  Zoster vaccine. You may need this after age 24.  Measles, mumps, and rubella (MMR) vaccine. You may need at least one dose of MMR if you were born in  1957 or later. You may also need a second dose.  Pneumococcal 13-valent conjugate (PCV13) vaccine. One dose is recommended after age 30.  Pneumococcal polysaccharide (PPSV23) vaccine. One dose is recommended after age 43.  Meningococcal vaccine. You may need this if you have certain conditions.  Hepatitis A vaccine. You may need this if you have certain conditions or if you travel or work in places where you may be exposed to hepatitis A.  Hepatitis B vaccine. You may need this if you have certain conditions or if you travel or work in places where you may be exposed to hepatitis B.  Haemophilus influenzae type b (Hib) vaccine. You may need this if you have certain conditions. Talk to your health care provider about which screenings and vaccines you need and how often you need them. This information is not intended to replace advice given to you by your health care provider. Make sure you discuss any questions you have with your health care provider. Document Released: 08/11/2015 Document Revised: 09/04/2017 Document Reviewed: 05/16/2015 Elsevier Interactive Patient Education  2019 [employer].

## 2018-09-15 NOTE — Progress Notes (Signed)
OK, tell her I need to see her in 1-2 weeks for FU HTN when she is taking her BP med every morning.

## 2018-09-16 ENCOUNTER — Other Ambulatory Visit: Payer: Self-pay | Admitting: Internal Medicine

## 2018-09-16 ENCOUNTER — Encounter: Payer: Self-pay | Admitting: Internal Medicine

## 2018-09-16 NOTE — Progress Notes (Unsigned)
Please call pt

## 2018-09-23 NOTE — Progress Notes (Signed)
Any news from the breast center?

## 2018-09-24 ENCOUNTER — Ambulatory Visit: Payer: Self-pay | Admitting: Internal Medicine

## 2018-10-16 ENCOUNTER — Encounter: Payer: Self-pay | Admitting: *Deleted

## 2018-10-16 NOTE — Telephone Encounter (Signed)
error 

## 2018-10-19 ENCOUNTER — Telehealth: Payer: Self-pay | Admitting: *Deleted

## 2018-10-19 ENCOUNTER — Ambulatory Visit: Payer: Self-pay | Admitting: Neurology

## 2018-10-19 ENCOUNTER — Encounter: Payer: Self-pay | Admitting: *Deleted

## 2018-10-19 NOTE — Telephone Encounter (Signed)
d/t covid 19 pandemic,our office is severely reducing in person visits for at least the next two week, in order to minimize the risk to our patients and healthcare providers. Advised that we recommend patient convert to a telemedicine visit. Although there may be some limitations with this type of visit, we will take all precautions to reduce any security or privacy concerns. This will be treated like an in-office visit and we will file with your insurance, and there may be a patient responsible charge related to this service.   Patient gave verbal consent to phone visit & to file to insurance. She stated her niece helps her with her medical care, etc so she will talk to her and have her there for the phone visit tomorrow. We were able to review allergies, med/sx/fam/social hx today however medications will be reconciled during visit. We planned for tele visit to be done tomorrow at 3:00 pm. Pt verbalized appreciation.

## 2018-10-19 NOTE — Telephone Encounter (Signed)
Please reschedule then thanks

## 2018-10-19 NOTE — Telephone Encounter (Signed)
Called pt and let her know that Dr. Jaynee Eagles prefers to do a webex appt where she can see her over video. Pt stated she didn't have a Computer and neither does her niece. She also doesn't think her niece has a compatible phone. I advised if these are not options, r/s is possible. She thinks we may need to wait on her appt then. She said she feels fine. I advised I will see what Dr. Jaynee Eagles advises.

## 2018-10-20 ENCOUNTER — Other Ambulatory Visit: Payer: Self-pay

## 2018-10-20 ENCOUNTER — Ambulatory Visit: Payer: Self-pay | Admitting: Neurology

## 2018-10-20 NOTE — Telephone Encounter (Signed)
I spoke with the pt and rescheduled her to Monday 12/14/2018 @ 08:00 arrival 07:30 and also explained that d/t uncertainties with the covid 19 we will let her know if there are any changes when it gets closer to her appt. She verbalized appreciation.

## 2018-10-22 ENCOUNTER — Ambulatory Visit: Payer: Self-pay | Admitting: Internal Medicine

## 2018-11-02 ENCOUNTER — Ambulatory Visit
Admission: RE | Admit: 2018-11-02 | Discharge: 2018-11-02 | Disposition: A | Discharge status: Home / Self Care | Payer: Medicare Other | Source: Ambulatory Visit | Attending: Internal Medicine | Admitting: Internal Medicine

## 2018-11-02 ENCOUNTER — Other Ambulatory Visit: Payer: Self-pay | Admitting: Internal Medicine

## 2018-11-02 ENCOUNTER — Other Ambulatory Visit: Payer: Self-pay

## 2018-11-02 DIAGNOSIS — N6310 Unspecified lump in the right breast, unspecified quadrant: Secondary | ICD-10-CM

## 2018-11-05 ENCOUNTER — Ambulatory Visit: Payer: Self-pay | Admitting: Internal Medicine

## 2018-11-16 ENCOUNTER — Ambulatory Visit (INDEPENDENT_AMBULATORY_CARE_PROVIDER_SITE_OTHER): Payer: Medicare Other | Admitting: Nurse Practitioner

## 2018-11-16 ENCOUNTER — Encounter: Payer: Self-pay | Admitting: Nurse Practitioner

## 2018-11-16 ENCOUNTER — Other Ambulatory Visit: Payer: Self-pay

## 2018-11-16 DIAGNOSIS — I10 Essential (primary) hypertension: Secondary | ICD-10-CM

## 2018-11-16 DIAGNOSIS — E1165 Type 2 diabetes mellitus with hyperglycemia: Secondary | ICD-10-CM | POA: Diagnosis not present

## 2018-11-16 DIAGNOSIS — IMO0002 Reserved for concepts with insufficient information to code with codable children: Secondary | ICD-10-CM

## 2018-11-16 DIAGNOSIS — E782 Mixed hyperlipidemia: Secondary | ICD-10-CM | POA: Diagnosis not present

## 2018-11-16 DIAGNOSIS — E118 Type 2 diabetes mellitus with unspecified complications: Secondary | ICD-10-CM

## 2018-11-16 MED ORDER — LISINOPRIL 20 MG PO TABS: 20.0000 mg | ORAL_TABLET | Freq: Every day | ORAL | 1 refills | Status: DC

## 2018-11-16 NOTE — Progress Notes (Signed)
Virtual Visit via Telephone Note    This visit type was conducted due to national recommendations for restrictions regarding the COVID-19 Pandemic (e.g. social distancing) in an effort to limit this patient's exposure and mitigate transmission in our community.  This format is felt to be most appropriate for this patient at this time.  All issues noted in this document were discussed and addressed.  No physical exam was performed (except for noted visual exam findings with Video Visits).  Please refer to the patient's chart (MyChart message for video visits and phone note for telephone visits) for the patient's consent to telehealth for [facility].  Date:  12/06/2018   ID:  [name], DOB [birth year], MRN [id number]  Patient Location:  Home -spoke with [name]  Provider location:   Office    Chief Complaint:  hypertension  History of Present Illness:    [name] is a 83 y.o. female who presents via video conferencing for a telehealth visit today.    The patient does not have symptoms concerning for COVID-19 infection (fever, chills, cough, or new shortness of breath).   Hypertension  This is a chronic problem. The current episode started more than 1 year ago. The problem is unchanged. The problem is controlled. Pertinent negatives include no anxiety. Risk factors for coronary artery disease include sedentary lifestyle and diabetes mellitus. Past treatments include ACE inhibitors. There are no compliance problems.  There is no history of angina. There is no history of chronic renal disease.  Diabetes  She presents for her follow-up diabetic visit. She has type 2 diabetes mellitus. Pertinent negatives for hypoglycemia include no confusion. There are no diabetic associated symptoms. Pertinent negatives for diabetes include no polydipsia, no polyphagia and no polyuria. There are no hypoglycemic complications. Symptoms are stable. There are no diabetic complications. There are no  known risk factors for coronary artery disease. Current diabetic treatment includes oral agent (monotherapy). She is compliant with treatment all of the time. She is following a generally healthy diet. She has not had a previous visit with a dietitian. She rarely participates in exercise. An ACE inhibitor/angiotensin II receptor blocker is being taken. She does not see a podiatrist.Eye exam is current.     Past Medical History:  Diagnosis Date  . Acute diastolic heart failure ([place]) 06/15/2018  . Acute kidney injury ([place]) 01/22/2015  . Acute renal failure ([place])   . Acute respiratory failure with hypoxia ([place]) 06/14/2015  . Allergy   . Anemia    "when I was young"  . Anginal pain ([place])   . Anxiety   . Arthritis    "all over my body"   . Chest pain 04/07/2014  . Chronic bronchitis ([place])    "get it just about q yr" (06/14/2015)  . Chronic diastolic CHF (congestive heart failure) ([place])   . Chronic diastolic heart failure, NYHA class 1 ([place]) 01/22/2015  . Chronic lower back pain   . Chronic steroid use 12/11/2012  . Constipation 01/09/2017  . COPD (chronic obstructive pulmonary disease) ([place]) 01/09/2017  . Dehydration, moderate 12/11/2012  . 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 DM (diabetes mellitus) ([place]) 04/08/2014  . Dyspnea 08/10/2018  . Dysrhythmia    patient unsure what it is but has been told that.  . E-coli UTI 01/09/2017  . 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 Elevated lactic acid level 01/22/2015  . Essential hypertension 06/15/2018  . Exertional shortness of breath   . Frequent UTI    "recently" (06/14/2015)  . Gastric  mass   . GERD (gastroesophageal reflux disease)   . Headache(784.0)    "weekly; wake up w/them " (110/19/2018)  . Heart murmur   . High cholesterol   . History of blood transfusion ~ 1954   "related to my periods"  . Hyperglycemia 12/11/2012  . Hypertension   . Intractable nausea and vomiting 05/15/2017  . Migraine    "once/month maybe" (05/16/2017)  . Multiple  falls 06/14/2015  . Noncompliance 12/11/2012  . Orthostasis 06/14/2015  . OSA (obstructive sleep apnea)    "I think I'm using a BiPAP" (06/14/2015)  . Sepsis ([place]) 01/09/2017  . Sepsis secondary to UTI ([place])   . Syncope and collapse 12/11/2012  . Tachycardia with heart rate 100-120 beats per minute 04/08/2014  . Type II diabetes mellitus ([place])   . Uncontrolled type 2 diabetes mellitus with complication ([place])   . UTI (lower urinary tract infection) 12/11/2012  . Weight loss 06/14/2015   Past Surgical History:  Procedure Laterality Date  . APPENDECTOMY  1970   Archie Endo 12/12/2010  . BACK SURGERY    . [place]   Archie Endo 12/12/2010  . EUS N/A 10/16/2017   Procedure: UPPER ENDOSCOPIC ULTRASOUND (EUS) RADIAL;  Surgeon: Milus Banister, MD;  Location: [facility];  Service: Endoscopy;  Laterality: N/A;  . FRACTURE SURGERY    . LAPAROSCOPIC CHOLECYSTECTOMY  1990's  . LUMBAR MICRODISCECTOMY Left 09/2006   L5-S1/notes 12/12/2010  . MULTIPLE EXTRACTIONS WITH ALVEOLOPLASTY Bilateral 06/13/2017   Procedure: MULTIPLE EXTRACTION WITH ALVEOLOPLASTY;  Surgeon: Diona Browner, DDS;  Location: [facility];  Service: Oral Surgery;  Laterality: Bilateral;  . MULTIPLE TOOTH EXTRACTIONS    . ORIF SHOULDER FRACTURE Left ~ 2009   "put cement down in it"   . TONSILLECTOMY  1950's?  . TOTAL ABDOMINAL HYSTERECTOMY  1979   Archie Endo 12/12/2010     Current Meds  Medication Sig  . albuterol (ACCUNEB) 0.63 MG/3ML nebulizer solution Take 2 ampules by nebulization every 6 (six) hours as needed for wheezing.  Marland Kitchen aspirin EC 81 MG tablet Take 1 tablet (81 mg total) by mouth daily.  . Aspirin-Caffeine (BC FAST PAIN RELIEF PO) Take 1 packet by mouth daily as needed (shoulder pain).  . budesonide-formoterol (SYMBICORT) 160-4.5 MCG/ACT inhaler Inhale 2 puffs into the lungs 2 (two) times daily.  Marland Kitchen dicyclomine (BENTYL) 20 MG tablet Take 20 mg by mouth 2 (two) times daily.  . hydroxychloroquine (PLAQUENIL) 200 MG  tablet Take 200 mg by mouth 2 (two) times daily.  . hydrOXYzine (ATARAX/VISTARIL) 10 MG tablet Take 10 mg by mouth at bedtime as needed.  Marland Kitchen JANUVIA 100 MG tablet TAKE 1 TABLET BY MOUTH EVERY DAY  . methylPREDNISolone (MEDROL) 4 MG tablet Take 2 mg by mouth daily.   Marland Kitchen omeprazole (PRILOSEC) 20 MG capsule Take 20 mg by mouth 2 (two) times daily.  . pravastatin (PRAVACHOL) 20 MG tablet Take 1 tablet (20 mg total) by mouth every evening.  Marland Kitchen PROAIR HFA 108 (90 Base) MCG/ACT inhaler Inhale 2 puffs into the lungs 4 (four) times daily as needed for wheezing or shortness of breath.  . Simethicone (GAS-X EXTRA STRENGTH PO) Take 180 mg by mouth daily.     Allergies:   Patient has no known allergies.   Social History   Tobacco Use  . Smoking status: Former Smoker    Types: Cigarettes  . Smokeless tobacco: Never Used  . Tobacco comment: 05/16/2017"1 pack of cigarettes would last 3-4 months; haven't had any cigarettes  for 40 years"  Substance Use Topics  . Alcohol use: Not Currently    Frequency: Never    Comment: 05/16/2017 "used to drink beer in the 1990's; don't drink at all now"  . Drug use: No     Family Hx: The patient's family history includes Cancer - Other in her sister; Diabetes Mellitus II in her sister; Heart attack in her father; Hypertension in her mother and sister; Migraines in her niece. There is no history of Colon cancer.  ROS:   Please see the history of present illness.    Review of Systems  Constitutional: Negative.   HENT: Negative.   Respiratory: Negative.   Cardiovascular: Negative.   Musculoskeletal: Negative.   Skin: Negative.   Endo/Heme/Allergies: Negative for polydipsia and polyphagia.  Psychiatric/Behavioral: Negative for confusion.    All other systems reviewed and are negative.   Labs/Other Tests and Data Reviewed:    Recent Labs: 06/08/2018: NT-Pro BNP 98 08/13/2018: ALT 9; BUN 14; Creatinine, Ser 1.49; Hemoglobin 13.1; Platelets 244; Potassium 4.6;  Sodium 141   Recent Lipid Panel Lab Results  Component Value Date/Time   CHOL 368 (H) 08/13/2018 04:06 PM   TRIG 275 (H) 08/13/2018 04:06 PM   HDL 70 08/13/2018 04:06 PM   CHOLHDL 5.3 (H) 08/13/2018 04:06 PM   CHOLHDL 4.2 Ratio 07/02/2010 09:40 PM   LDLCALC 243 (H) 08/13/2018 04:06 PM    Wt Readings from Last 3 Encounters:  09/10/18 138 lb (62.6 kg)  09/10/18 138 lb (62.6 kg)  08/24/18 135 lb (61.2 kg)     Exam:    Vital Signs:  There were no vitals taken for this visit.    Physical Exam This is a telephone visit unable to visualize patient however her speech is clear and no acute distress noted. ASSESSMENT & PLAN:    1. Essential hypertension . Unable to get blood pressure today due to a telephone visit..  . CMP ordered to check renal function. - she will come in tomorrow for labs - lisinopril (ZESTRIL) 20 MG tablet; Take 1 tablet (20 mg total) by mouth daily.  Dispense: 90 tablet; Refill: 1  2. Uncontrolled type 2 diabetes mellitus with complication (HCC)  Chronic, poorly controlled  Continue with current medications  Encouraged to limit intake of sugary foods and drinks  Encouraged to increase physical activity to 150 minutes per week - Hemoglobin A1c; Future - CMP14 + Anion Gap - Ambulatory referral to Ophthalmology  3. Mixed hyperlipidemia  Chronic, controlled  Continue with current medications - Lipid Profile; Future   COVID-19 Education: The signs and symptoms of COVID-19 were discussed with the patient and how to seek care for testing (follow up with PCP or arrange E-visit).  The importance of social distancing was discussed today.  Patient Risk:   After full review of this patients clinical status, I feel that they are at least moderate risk at this time.  Time:   Today, I have spent 11 minutes/ seconds with the patient with telehealth technology discussing above diagnoses.     Medication Adjustments/Labs and Tests Ordered: Current medicines  are reviewed at length with the patient today.  Concerns regarding medicines are outlined above.   Tests Ordered: Orders Placed This Encounter  Procedures  . Hemoglobin A1c  . CMP14 + Anion Gap  . Lipid Profile  . Ambulatory referral to Ophthalmology    Medication Changes: Meds ordered this encounter  Medications  . lisinopril (ZESTRIL) 20 MG tablet    Sig: Take  1 tablet (20 mg total) by mouth daily.    Dispense:  90 tablet    Refill:  1    Disposition:  Follow up in 3 month(s)  Signed, Minette Brine, FNP

## 2018-11-17 ENCOUNTER — Other Ambulatory Visit: Payer: Self-pay | Admitting: Internal Medicine

## 2018-11-24 ENCOUNTER — Telehealth: Payer: Self-pay

## 2018-11-24 NOTE — Telephone Encounter (Signed)
Called pt to see if she is taking her Januvia as directed due to her ins stating she is not taking it regularly she stated she is taking it. YRL,RMA

## 2018-11-30 ENCOUNTER — Ambulatory Visit: Payer: Self-pay | Admitting: Nurse Practitioner

## 2018-12-06 ENCOUNTER — Encounter: Payer: Self-pay | Admitting: Nurse Practitioner

## 2018-12-09 NOTE — Telephone Encounter (Signed)
Pt covid19 risk score is high. D/t risks and office policy right now d/t virus, called pt & LVM asking for call back. Will r/s.

## 2018-12-10 NOTE — Telephone Encounter (Signed)
Tried to reach patient again about appt on Monday 5/18 @ 8AM. I called pt & LVM asking for call back. Let her know the risks are still high regarding [id number] complications for her so I asked for a return call to discuss r/s her appt.

## 2018-12-14 ENCOUNTER — Ambulatory Visit: Payer: Self-pay | Admitting: Neurology

## 2019-02-15 ENCOUNTER — Other Ambulatory Visit: Payer: Self-pay | Admitting: Internal Medicine

## 2019-02-15 ENCOUNTER — Telehealth: Payer: Self-pay | Admitting: Emergency Medicine

## 2019-02-15 NOTE — Telephone Encounter (Signed)
Called [facility], spoke with [employer]. [place] requested to have lisinopril-hydrochlorothiazide changed to another medication, because medication is not available at this time.  Per Epic, prescription was sent 02/15/19, by [name], PA. Nunzio Cobbs to contact [place], [place] for changes.  Nothing further at this time.

## 2019-02-16 ENCOUNTER — Ambulatory Visit: Payer: Self-pay | Admitting: Internal Medicine

## 2019-02-18 ENCOUNTER — Encounter: Payer: Self-pay | Admitting: Internal Medicine

## 2019-02-18 ENCOUNTER — Other Ambulatory Visit: Payer: Self-pay

## 2019-02-18 ENCOUNTER — Ambulatory Visit (INDEPENDENT_AMBULATORY_CARE_PROVIDER_SITE_OTHER): Payer: Medicare Other | Admitting: Internal Medicine

## 2019-02-18 VITALS — BP 130/78 | HR 102 | Temp 98.4°F | Ht 62.0 in | Wt 147.0 lb

## 2019-02-18 DIAGNOSIS — K3189 Other diseases of stomach and duodenum: Secondary | ICD-10-CM

## 2019-02-18 DIAGNOSIS — M05741 Rheumatoid arthritis with rheumatoid factor of right hand without organ or systems involvement: Secondary | ICD-10-CM

## 2019-02-18 DIAGNOSIS — E1165 Type 2 diabetes mellitus with hyperglycemia: Secondary | ICD-10-CM

## 2019-02-18 DIAGNOSIS — IMO0002 Reserved for concepts with insufficient information to code with codable children: Secondary | ICD-10-CM

## 2019-02-18 DIAGNOSIS — M25511 Pain in right shoulder: Secondary | ICD-10-CM

## 2019-02-18 DIAGNOSIS — I10 Essential (primary) hypertension: Secondary | ICD-10-CM | POA: Diagnosis not present

## 2019-02-18 DIAGNOSIS — M05742 Rheumatoid arthritis with rheumatoid factor of left hand without organ or systems involvement: Secondary | ICD-10-CM

## 2019-02-18 NOTE — Progress Notes (Signed)
Subjective:     Patient ID: [name] , female    DOB: 1936/05/26 , 83 y.o.   MRN: [id number]   Chief Complaint  Patient presents with  . Hypertension  . Diabetes    HPI 1- DM FU- glucose has been 150-190 post prandial which is the only time she checks it.  2- R shoulder pain since she kept on hitting food falling from her freezer 2 days ago, and  cant lift arm up due to pain. Has tried ice and heat and tylenol. She has apt with her Rheumatologist in 3 days. 3- HTN FU, her niece checks her BP but she know what it runs.  She had her last q 6 month oncology apt, and was told she can come back yearly now.   Past Medical History:  Diagnosis Date  . Acute diastolic heart failure ([place]) 06/15/2018  . Acute kidney injury ([place]) 01/22/2015  . Acute renal failure ([place])   . Acute respiratory failure with hypoxia ([place]) 06/14/2015  . Allergy   . Anemia    "when I was young"  . Anginal pain ([place])   . Anxiety   . Arthritis    "all over my body"   . Chest pain 04/07/2014  . Chronic bronchitis ([place])    "get it just about q yr" (06/14/2015)  . Chronic diastolic CHF (congestive heart failure) ([place])   . Chronic diastolic heart failure, NYHA class 1 ([place]) 01/22/2015  . Chronic lower back pain   . Chronic steroid use 12/11/2012  . Constipation 01/09/2017  . COPD (chronic obstructive pulmonary disease) ([place]) 01/09/2017  . Dehydration, moderate 12/11/2012  . 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 DM (diabetes mellitus) ([place]) 04/08/2014  . Dyspnea 08/10/2018  . Dysrhythmia    patient unsure what it is but has been told that.  . E-coli UTI 01/09/2017  . 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 Elevated lactic acid level 01/22/2015  . Essential hypertension 06/15/2018  . Exertional shortness of breath   . Frequent UTI    "recently" (06/14/2015)  . Gastric mass   . GERD (gastroesophageal reflux disease)   . Headache(784.0)    "weekly; wake up w/them " (110/19/2018)  . Heart murmur   . High cholesterol    . History of blood transfusion ~ 1954   "related to my periods"  . Hyperglycemia 12/11/2012  . Hypertension   . Intractable nausea and vomiting 05/15/2017  . Migraine    "once/month maybe" (05/16/2017)  . Multiple falls 06/14/2015  . Noncompliance 12/11/2012  . Orthostasis 06/14/2015  . OSA (obstructive sleep apnea)    "I think I'm using a BiPAP" (06/14/2015)  . Sepsis ([place]) 01/09/2017  . Sepsis secondary to UTI ([place])   . Syncope and collapse 12/11/2012  . Tachycardia with heart rate 100-120 beats per minute 04/08/2014  . Type II diabetes mellitus ([place])   . Uncontrolled type 2 diabetes mellitus with complication ([place])   . UTI (lower urinary tract infection) 12/11/2012  . Weight loss 06/14/2015     Family History  Problem Relation Age of Onset  . Hypertension Mother   . Heart attack Father   . Diabetes Mellitus II Sister   . Hypertension Sister   . Cancer - Other Sister   . Migraines Niece   . Colon cancer Neg Hx      Current Outpatient Medications:  .  albuterol (ACCUNEB) 0.63 MG/3ML nebulizer solution, Take 2 ampules by nebulization every 6 (six) hours as needed for wheezing., Disp: , Rfl:  .  aspirin EC 81 MG tablet, Take 1 tablet (81 mg total) by mouth daily., Disp: , Rfl:  .  Aspirin-Caffeine (BC FAST PAIN RELIEF PO), Take 1 packet by mouth daily as needed (shoulder pain)., Disp: , Rfl:  .  budesonide-formoterol (SYMBICORT) 160-4.5 MCG/ACT inhaler, Inhale 2 puffs into the lungs 2 (two) times daily., Disp: 1 Inhaler, Rfl: 2 .  dicyclomine (BENTYL) 20 MG tablet, Take 20 mg by mouth 2 (two) times daily., Disp: , Rfl: 1 .  hydroxychloroquine (PLAQUENIL) 200 MG tablet, Take 200 mg by mouth 2 (two) times daily., Disp: , Rfl: 0 .  hydrOXYzine (ATARAX/VISTARIL) 10 MG tablet, Take 10 mg by mouth at bedtime as needed., Disp: , Rfl:  .  ibuprofen (ADVIL,MOTRIN) 800 MG tablet, Take 800 mg by mouth daily as needed for pain., Disp: , Rfl:  .  JANUVIA 100 MG tablet, TAKE 1 TABLET BY MOUTH  EVERY DAY, Disp: 90 tablet, Rfl: 0 .  lisinopril-hydrochlorothiazide (ZESTORETIC) 10-12.5 MG tablet, TAKE 1 TABLET BY MOUTH EVERY DAY, Disp: 90 tablet, Rfl: 0 .  methylPREDNISolone (MEDROL) 4 MG tablet, Take 2 mg by mouth daily. , Disp: , Rfl:  .  omeprazole (PRILOSEC) 20 MG capsule, Take 20 mg by mouth 2 (two) times daily., Disp: , Rfl: 0 .  PROAIR HFA 108 (90 Base) MCG/ACT inhaler, Inhale 2 puffs into the lungs 4 (four) times daily as needed for wheezing or shortness of breath., Disp: , Rfl: 2 .  Simethicone (GAS-X EXTRA STRENGTH PO), Take 180 mg by mouth daily., Disp: , Rfl:  .  lisinopril (ZESTRIL) 20 MG tablet, Take 1 tablet (20 mg total) by mouth daily. (Patient not taking: Reported on 02/18/2019), Disp: 90 tablet, Rfl: 1 .  pravastatin (PRAVACHOL) 20 MG tablet, Take 1 tablet (20 mg total) by mouth every evening. (Patient not taking: Reported on 02/18/2019), Disp: 90 tablet, Rfl: 0 .  traMADol (ULTRAM) 50 MG tablet, Take 50 mg daily as needed by mouth for moderate pain. , Disp: , Rfl:    No Known Allergies   Review of Systems   + for R shoulder pain, negative for CP, SOB, HA's, sweats, urinary difficulty, abdominal pain, constipation or diarrhea. She has been feeling more SOB at night time due to the heat and gives herself a neb treatment which helps. This has been going on since the weather has been hotter. She does not do this in the Winter time.  Today's Vitals   02/18/19 1437  BP: 130/78  Pulse: (!) 102  Temp: 98.4 F (36.9 C)  TempSrc: Oral  SpO2: 97%  Weight: 147 lb (66.7 kg)  Height: 5\' 2"  (1.575 m)   Body mass index is 26.89 kg/m.   Objective:  Physical Exam    Constitutional: She is oriented to person, place, and time. She appears well-developed and well-nourished. No distress.  HENT:  Head: Normocephalic and atraumatic.  Right Ear: External ear normal.  Left Ear: External ear normal.  Nose: Nose normal.  Eyes: Conjunctivae are normal. Right eye exhibits no  discharge. Left eye exhibits no discharge. No scleral icterus.  Neck: Neck supple. No thyromegaly present.  No carotid bruits bilaterally  Cardiovascular: Normal rate and regular rhythm.  No murmur heard. Pulmonary/Chest: Effort normal and breath sounds normal. No respiratory distress.  Musculoskeletal: has painful deltoid and decreased ROM of her R shoulder. Skin shows no swelling or ecchymosis. Hands have arthritis deformities.  Lymphadenopathy:    She has no cervical adenopathy.  Neurological: She is alert and  oriented to person, place, and time.  Skin: Skin is warm and dry. Capillary refill takes less than 2 seconds. No rash noted. She is not diaphoretic.  Psychiatric: She has a normal mood and affect. Her behavior is normal. Judgment and thought content normal.  Nursing note reviewed.  Assessment And Plan:  1. Acute pain of right shoulder- acute.  - DG Shoulder Right; Future  We will inform her of the results when back. Advised her to do stretches to prevent it from freezing.  2. Essential hypertension- stable. May continue same med.  - CMP14 + Anion Gap - CBC no Diff  3. Uncontrolled type 2 diabetes mellitus with complication (HCC)-chronic. May continue same meds.  - Hemoglobin A1c  4. Gastric mass-stable. Will continue with oncology care.   5. Rheumatoid arthritis involving both hands with positive rheumatoid factor (HCC)-chronic. Will continue care with her rheumatologist.    Ayssa Bentivegna RODRIGUEZ-SOUTHWORTH, PA-C    THE PATIENT IS ENCOURAGED TO PRACTICE SOCIAL DISTANCING DUE TO THE COVID-19 PANDEMIC.

## 2019-02-18 NOTE — Patient Instructions (Signed)
Shoulder Sprain  A shoulder sprain is a partial or complete tear in one of the tough, fiber-like tissues (ligaments) in the shoulder. The ligaments in the shoulder help to hold the shoulder in place. What are the causes? This condition may be caused by:  A fall.  A hit to the shoulder.  A twist of the arm. What increases the risk? You are more likely to develop this condition if you:  Play sports.  Have problems with balance or coordination. What are the signs or symptoms? Symptoms of this condition include:  Pain when moving the shoulder.  Limited ability to move the shoulder.  Swelling and tenderness on top of the shoulder.  Warmth in the shoulder.  A change in the shape of the shoulder.  Redness or bruising on the shoulder. How is this diagnosed? This condition is diagnosed with:  A physical exam. During the exam, you may be asked to do simple exercises with your shoulder.  Imaging tests such as X-rays, MRI, or a CT scan. These tests can show how severe the sprain is. How is this treated? This condition may be treated with:  Rest.  Pain medicine.  Ice.  A sling or brace. This is used to keep the arm still while the shoulder is healing.  Physical therapy or rehabilitation exercises. These help to improve the range of motion and strength of the shoulder.  Surgery (rare). Surgery may be needed if the sprain caused a joint to become unstable. Surgery may also be needed to reduce pain. Some people may develop ongoing shoulder pain or lose some range of motion in the shoulder. However, most people do not develop long-term problems. Follow these instructions at home:  If you have a sling or brace:  Wear the sling or brace as told by your health care provider. Remove it only as told by your health care provider.  Loosen the sling or brace if your fingers tingle, become numb, or turn cold and blue.  Keep the sling or brace clean.  If the sling or brace is not  waterproof: ? Do not let it get wet. ? Cover it with a watertight covering when you take a bath or shower. Activity  Rest your shoulder.  Move your arm only as much as told by your health care provider, but move your hand and fingers often to prevent stiffness and swelling.  Return to your normal activities as told by your health care provider. Ask your health care provider what activities are safe for you.  Ask your health care provider when it is safe for you to drive if you have a sling or brace on your shoulder.  If you were shown how to do any exercises, do them as told by your health care provider. General instructions  If directed, put ice on the affected area. ? Put ice in a plastic bag. ? Place a towel between your skin and the bag. ? Leave the ice on for 20 minutes, 2-3 times a day.  Take over-the-counter and prescription medicines only as told by your health care provider.  Do not use any products that contain nicotine or tobacco, such as cigarettes, e-cigarettes, and chewing tobacco. These can delay healing. If you need help quitting, ask your health care provider.  Keep all follow-up visits as told by your health care provider. This is important. Contact a health care provider if:  Your pain gets worse.  Your pain is not relieved with medicines.  You have increased  redness or swelling. Get help right away if:  You have a fever.  You cannot move your arm or shoulder.  You develop severe numbness or tingling in your arm, hand, or fingers.  Your arm, hand, or fingers feel cold and turn blue, white, or gray. Summary  A shoulder sprain is a partial or complete tear in one of the tough, fiber-like tissues (ligaments) in the shoulder.  This condition may be caused by a fall, a hit to the shoulder, or a twist of the arm.  Treatment usually includes rest, ice, and pain medicine as needed.  If you have a sling or brace, wear it as told by your health care  provider. Remove it only as told by your health care provider. This information is not intended to replace advice given to you by your health care provider. Make sure you discuss any questions you have with your health care provider. Document Released: 12/01/2008 Document Revised: 12/19/2017 Document Reviewed: 12/19/2017 Elsevier Patient Education  2020 [employer].

## 2019-02-19 LAB — CBC
Hematocrit: 38.4 % (ref 34.0–46.6)
Hemoglobin: 13.2 g/dL (ref 11.1–15.9)
MCH: 29.5 pg (ref 26.6–33.0)
MCHC: 34.4 g/dL (ref 31.5–35.7)
MCV: 86 fL (ref 79–97)
Platelets: 274 10*3/uL (ref 150–450)
RBC: 4.47 x10E6/uL (ref 3.77–5.28)
RDW: 14.2 % (ref 11.7–15.4)
WBC: 11.5 10*3/uL — ABNORMAL HIGH (ref 3.4–10.8)

## 2019-02-19 LAB — CMP14 + ANION GAP
ALT: 15 IU/L (ref 0–32)
AST: 21 IU/L (ref 0–40)
Albumin/Globulin Ratio: 1.8 (ref 1.2–2.2)
Albumin: 4.8 g/dL — ABNORMAL HIGH (ref 3.7–4.7)
Alkaline Phosphatase: 59 IU/L (ref 39–117)
Anion Gap: 19 mmol/L — ABNORMAL HIGH (ref 10.0–18.0)
BUN/Creatinine Ratio: 13 (ref 12–28)
BUN: 14 mg/dL (ref 8–27)
Bilirubin Total: <0.2 mg/dL (ref 0.0–1.2)
CO2: 20 mmol/L (ref 20–29)
Calcium: 10.1 mg/dL (ref 8.7–10.3)
Chloride: 98 mmol/L (ref 96–106)
Creatinine, Ser: 1.05 mg/dL — ABNORMAL HIGH (ref 0.57–1.00)
GFR calc Af Amer: 58 mL/min/{1.73_m2} — ABNORMAL LOW (ref >59)
GFR calc non Af Amer: 50 mL/min/{1.73_m2} — ABNORMAL LOW (ref >59)
Globulin, Total: 2.6 g/dL (ref 1.5–4.5)
Glucose: 126 mg/dL — ABNORMAL HIGH (ref 65–99)
Potassium: 4.4 mmol/L (ref 3.5–5.2)
Sodium: 137 mmol/L (ref 134–144)
Total Protein: 7.4 g/dL (ref 6.0–8.5)

## 2019-02-19 LAB — HEMOGLOBIN A1C
Est. average glucose Bld gHb Est-mCnc: 183 mg/dL
Hgb A1c MFr Bld: 8 % — ABNORMAL HIGH (ref 4.8–5.6)

## 2019-02-25 ENCOUNTER — Other Ambulatory Visit: Payer: Self-pay | Admitting: Internal Medicine

## 2019-02-25 MED ORDER — EMPAGLIFLOZIN 25 MG PO TABS: 25.0000 mg | ORAL_TABLET | Freq: Every day | ORAL | 0 refills | Status: DC

## 2019-03-11 ENCOUNTER — Ambulatory Visit (INDEPENDENT_AMBULATORY_CARE_PROVIDER_SITE_OTHER): Payer: Medicare Other | Admitting: Internal Medicine

## 2019-03-11 ENCOUNTER — Encounter: Payer: Self-pay | Admitting: Internal Medicine

## 2019-03-11 ENCOUNTER — Other Ambulatory Visit: Payer: Self-pay

## 2019-03-11 VITALS — BP 130/72 | HR 110 | Temp 98.3°F | Ht 62.0 in | Wt 146.0 lb

## 2019-03-11 DIAGNOSIS — E1165 Type 2 diabetes mellitus with hyperglycemia: Secondary | ICD-10-CM | POA: Diagnosis not present

## 2019-03-11 DIAGNOSIS — R0602 Shortness of breath: Secondary | ICD-10-CM

## 2019-03-11 DIAGNOSIS — R0789 Other chest pain: Secondary | ICD-10-CM | POA: Diagnosis not present

## 2019-03-11 DIAGNOSIS — I10 Essential (primary) hypertension: Secondary | ICD-10-CM

## 2019-03-11 DIAGNOSIS — IMO0002 Reserved for concepts with insufficient information to code with codable children: Secondary | ICD-10-CM

## 2019-03-11 DIAGNOSIS — E118 Type 2 diabetes mellitus with unspecified complications: Secondary | ICD-10-CM | POA: Diagnosis not present

## 2019-03-11 DIAGNOSIS — F4321 Adjustment disorder with depressed mood: Secondary | ICD-10-CM

## 2019-03-11 DIAGNOSIS — R000 Tachycardia, unspecified: Secondary | ICD-10-CM

## 2019-03-11 MED ORDER — HYDROXYZINE PAMOATE 25 MG PO CAPS: ORAL_CAPSULE | ORAL | 0 refills | Status: DC

## 2019-03-11 NOTE — Progress Notes (Signed)
Subjective:     Patient ID: [name] , female    DOB: 1935/11/17 , 83 y.o.   MRN: [id number]   Chief Complaint  Patient presents with  . Diabetes    HPI  Here for Dm FU, due to elevated creatinine, I switched her medication to Jardiance 50 mg and today she needs to have her creatinine checked. Her glucose post prandial has been as high as 200, and fasting 60-90.  Is sad due to brother passing 2 weeks ago. Says has not been feeling well and has felt chest pressure and tightness, and sometimes would get SOB at night time from the heat but the inhalers helped. She is having trouble sleeping. His funeral is tomorrow.   Has been avoding protein shakes, and she hardly eats much carbs. Eating more fruit instead of refined carbs.  Past Medical History:  Diagnosis Date  . Acute diastolic heart failure ([place]) 06/15/2018  . Acute kidney injury ([place]) 01/22/2015  . Acute renal failure ([place])   . Acute respiratory failure with hypoxia ([place]) 06/14/2015  . Allergy   . Anemia    "when I was young"  . Anginal pain ([place])   . Anxiety   . Arthritis    "all over my body"   . Chest pain 04/07/2014  . Chronic bronchitis ([place])    "get it just about q yr" (06/14/2015)  . Chronic diastolic CHF (congestive heart failure) ([place])   . Chronic diastolic heart failure, NYHA class 1 ([place]) 01/22/2015  . Chronic lower back pain   . Chronic steroid use 12/11/2012  . Constipation 01/09/2017  . COPD (chronic obstructive pulmonary disease) ([place]) 01/09/2017  . Dehydration, moderate 12/11/2012  . 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 DM (diabetes mellitus) ([place]) 04/08/2014  . Dyspnea 08/10/2018  . Dysrhythmia    patient unsure what it is but has been told that.  . E-coli UTI 01/09/2017  . 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 Elevated lactic acid level 01/22/2015  . Essential hypertension 06/15/2018  . Exertional shortness of breath   . Frequent UTI    "recently" (06/14/2015)  . Gastric mass   . GERD (gastroesophageal  reflux disease)   . Headache(784.0)    "weekly; wake up w/them " (110/19/2018)  . Heart murmur   . High cholesterol   . History of blood transfusion ~ 1954   "related to my periods"  . Hyperglycemia 12/11/2012  . Hypertension   . Intractable nausea and vomiting 05/15/2017  . Migraine    "once/month maybe" (05/16/2017)  . Multiple falls 06/14/2015  . Noncompliance 12/11/2012  . Orthostasis 06/14/2015  . OSA (obstructive sleep apnea)    "I think I'm using a BiPAP" (06/14/2015)  . Sepsis ([place]) 01/09/2017  . Sepsis secondary to UTI ([place])   . Syncope and collapse 12/11/2012  . Tachycardia with heart rate 100-120 beats per minute 04/08/2014  . Type II diabetes mellitus ([place])   . Uncontrolled type 2 diabetes mellitus with complication ([place])   . UTI (lower urinary tract infection) 12/11/2012  . Weight loss 06/14/2015     Family History  Problem Relation Age of Onset  . Hypertension Mother   . Heart attack Father   . Diabetes Mellitus II Sister   . Hypertension Sister   . Cancer - Other Sister   . Migraines Niece   . Colon cancer Neg Hx      Current Outpatient Medications:  .  albuterol (ACCUNEB) 0.63 MG/3ML nebulizer solution, Take 2 ampules by nebulization every 6 (  six) hours as needed for wheezing., Disp: , Rfl:  .  aspirin EC 81 MG tablet, Take 1 tablet (81 mg total) by mouth daily., Disp: , Rfl:  .  Aspirin-Caffeine (BC FAST PAIN RELIEF PO), Take 1 packet by mouth daily as needed (shoulder pain)., Disp: , Rfl:  .  budesonide-formoterol (SYMBICORT) 160-4.5 MCG/ACT inhaler, Inhale 2 puffs into the lungs 2 (two) times daily., Disp: 1 Inhaler, Rfl: 2 .  dicyclomine (BENTYL) 20 MG tablet, Take 20 mg by mouth 2 (two) times daily., Disp: , Rfl: 1 .  empagliflozin (JARDIANCE) 25 MG TABS tablet, Take 25 mg by mouth daily., Disp: 90 tablet, Rfl: 0 .  hydroxychloroquine (PLAQUENIL) 200 MG tablet, Take 200 mg by mouth 2 (two) times daily., Disp: , Rfl: 0 .  hydrOXYzine (ATARAX/VISTARIL) 10  MG tablet, Take 10 mg by mouth at bedtime as needed., Disp: , Rfl:  .  ibuprofen (ADVIL,MOTRIN) 800 MG tablet, Take 800 mg by mouth daily as needed for pain., Disp: , Rfl:  .  lisinopril-hydrochlorothiazide (ZESTORETIC) 10-12.5 MG tablet, TAKE 1 TABLET BY MOUTH EVERY DAY, Disp: 90 tablet, Rfl: 0 .  methylPREDNISolone (MEDROL) 4 MG tablet, Take 2 mg by mouth daily. , Disp: , Rfl:  .  omeprazole (PRILOSEC) 20 MG capsule, Take 20 mg by mouth 2 (two) times daily., Disp: , Rfl: 0 .  pravastatin (PRAVACHOL) 20 MG tablet, Take 1 tablet (20 mg total) by mouth every evening. (Patient not taking: Reported on 02/18/2019), Disp: 90 tablet, Rfl: 0 .  PROAIR HFA 108 (90 Base) MCG/ACT inhaler, Inhale 2 puffs into the lungs 4 (four) times daily as needed for wheezing or shortness of breath., Disp: , Rfl: 2 .  Simethicone (GAS-X EXTRA STRENGTH PO), Take 180 mg by mouth daily., Disp: , Rfl:  .  traMADol (ULTRAM) 50 MG tablet, Take 50 mg daily as needed by mouth for moderate pain. , Disp: , Rfl:    No Known Allergies   Review of Systems  Constitutional: Positive for appetite change. Negative for chills, diaphoresis, fatigue and fever.  HENT: Negative for congestion.   Respiratory: Positive for cough and shortness of breath. Negative for choking, chest tightness and wheezing.   Cardiovascular: Positive for chest pain. Negative for palpitations and leg swelling.  Endocrine: Negative for polydipsia, polyphagia and polyuria.  Genitourinary: Negative for difficulty urinating.  Skin: Negative for wound.  Allergic/Immunologic: Negative for environmental allergies.  Neurological: Negative for dizziness, light-headedness and headaches.  Psychiatric/Behavioral: Positive for sleep disturbance.       Has been sad and crying a lot due to death of her brother last week     Today's Vitals   03/11/19 1446  BP: 130/72  Pulse: (!) 110  Temp: 98.3 F (36.8 C)  TempSrc: Oral  SpO2: 98%  Weight: 146 lb (66.2 kg)  Height:  5\' 2"  (1.575 m)   Body mass index is 26.7 kg/m.   Objective:  Physical Exam   Constitutional: She is oriented to person, place, and time. She appears well-developed and well-nourished. No distress.  HENT:  Head: Normocephalic and atraumatic.  Right Ear: External ear normal.  Left Ear: External ear normal.  Nose: Nose normal.  Eyes: Conjunctivae are normal. Right eye exhibits no discharge. Left eye exhibits no discharge. No scleral icterus.  Neck: Neck supple. No thyromegaly present.  No carotid bruits bilaterally  Cardiovascular: Normal rate and regular rhythm.  No murmur heard. Pulmonary/Chest: Effort normal and breath sounds normal. No respiratory distress.  Musculoskeletal: Normal  range of motion. She exhibits no edema.  Lymphadenopathy:    She has no cervical adenopathy.  Neurological: She is alert and oriented to person, place, and time.  Skin: Skin is warm and dry. Capillary refill takes less than 2 seconds. No rash noted. She is not diaphoretic.  Psychiatric: She has a normal mood and affect. Her behavior is normal. Judgment and thought content normal.  Nursing note reviewed. EKG- HR= 103  unchanged when compared to 06/2018 one.     Assessment And Plan:   1. SOB (shortness of breath)- improved with her inhalers, seems more so from COPD.  - EKG 12-Lead- unchanged when compared to last year in December. Had a normal cardiology visit this year.   2. Chest tightness- as noted above. No action.  - EKG 12-Lead  3. Uncontrolled type 2 diabetes mellitus with complication ([place])- unknown status. I will await on creatinine results and see if we need to change her medication or not. If unchanged or worse, then I will switch her to Trajenta.  - Hemoglobin A1c I asked her to bring her glucose readings next visit in 1 month.  4. Essential hypertension- stable.  - CMP14 + Anion Gap - CBC no Diff  5. Tachycardia with heart rate 100-120 beats per minute- chronic since childhood and I  think was worse today due to grieving  - TSH  6- grieving- acute. I will have her tray atarax 25 mg 1-2 qhs to help her sleep.    Vicki Bracco RODRIGUEZ-SOUTHWORTH, PA-C    THE PATIENT IS ENCOURAGED TO PRACTICE SOCIAL DISTANCING DUE TO THE COVID-19 PANDEMIC.

## 2019-03-12 LAB — CBC
Hematocrit: 36 % (ref 34.0–46.6)
Hemoglobin: 12.4 g/dL (ref 11.1–15.9)
MCH: 30.2 pg (ref 26.6–33.0)
MCHC: 34.4 g/dL (ref 31.5–35.7)
MCV: 88 fL (ref 79–97)
Platelets: 306 10*3/uL (ref 150–450)
RBC: 4.11 x10E6/uL (ref 3.77–5.28)
RDW: 13.7 % (ref 11.7–15.4)
WBC: 11.5 10*3/uL — ABNORMAL HIGH (ref 3.4–10.8)

## 2019-03-12 LAB — CMP14 + ANION GAP
ALT: 15 IU/L (ref 0–32)
AST: 23 IU/L (ref 0–40)
Albumin/Globulin Ratio: 1.7 (ref 1.2–2.2)
Albumin: 4.5 g/dL (ref 3.7–4.7)
Alkaline Phosphatase: 55 IU/L (ref 39–117)
Anion Gap: 19 mmol/L — ABNORMAL HIGH (ref 10.0–18.0)
BUN/Creatinine Ratio: 12 (ref 12–28)
BUN: 17 mg/dL (ref 8–27)
Bilirubin Total: <0.2 mg/dL (ref 0.0–1.2)
CO2: 18 mmol/L — ABNORMAL LOW (ref 20–29)
Calcium: 9.5 mg/dL (ref 8.7–10.3)
Chloride: 92 mmol/L — ABNORMAL LOW (ref 96–106)
Creatinine, Ser: 1.45 mg/dL — ABNORMAL HIGH (ref 0.57–1.00)
GFR calc Af Amer: 39 mL/min/{1.73_m2} — ABNORMAL LOW (ref >59)
GFR calc non Af Amer: 34 mL/min/{1.73_m2} — ABNORMAL LOW (ref >59)
Globulin, Total: 2.6 g/dL (ref 1.5–4.5)
Glucose: 137 mg/dL — ABNORMAL HIGH (ref 65–99)
Potassium: 4.3 mmol/L (ref 3.5–5.2)
Sodium: 129 mmol/L — ABNORMAL LOW (ref 134–144)
Total Protein: 7.1 g/dL (ref 6.0–8.5)

## 2019-03-12 LAB — TSH: TSH: 0.501 u[IU]/mL (ref 0.450–4.500)

## 2019-03-12 LAB — HEMOGLOBIN A1C
Est. average glucose Bld gHb Est-mCnc: 189 mg/dL
Hgb A1c MFr Bld: 8.2 % — ABNORMAL HIGH (ref 4.8–5.6)

## 2019-03-25 ENCOUNTER — Other Ambulatory Visit: Payer: Self-pay

## 2019-03-25 ENCOUNTER — Ambulatory Visit (INDEPENDENT_AMBULATORY_CARE_PROVIDER_SITE_OTHER): Payer: Medicare Other | Admitting: Internal Medicine

## 2019-03-25 ENCOUNTER — Encounter: Payer: Self-pay | Admitting: Internal Medicine

## 2019-03-25 VITALS — BP 132/74 | HR 111 | Temp 98.6°F | Ht 62.0 in | Wt 146.6 lb

## 2019-03-25 DIAGNOSIS — IMO0002 Reserved for concepts with insufficient information to code with codable children: Secondary | ICD-10-CM

## 2019-03-25 DIAGNOSIS — E782 Mixed hyperlipidemia: Secondary | ICD-10-CM

## 2019-03-25 DIAGNOSIS — E1165 Type 2 diabetes mellitus with hyperglycemia: Secondary | ICD-10-CM

## 2019-03-25 DIAGNOSIS — N183 Chronic kidney disease, stage 3 unspecified: Secondary | ICD-10-CM

## 2019-03-25 DIAGNOSIS — E119 Type 2 diabetes mellitus without complications: Secondary | ICD-10-CM | POA: Diagnosis not present

## 2019-03-25 MED ORDER — ONETOUCH VERIO VI STRP: ORAL_STRIP | 12 refills | Status: AC

## 2019-03-25 MED ORDER — RYBELSUS 3 MG PO TABS: 3.0000 mg | ORAL_TABLET | ORAL | 0 refills | Status: AC

## 2019-03-25 MED ORDER — COENZYME Q10 30 MG PO CAPS: 30.0000 mg | ORAL_CAPSULE | Freq: Every day | ORAL | 3 refills | Status: DC

## 2019-03-25 MED ORDER — ATORVASTATIN CALCIUM 10 MG PO TABS: ORAL_TABLET | ORAL | 0 refills | Status: DC

## 2019-03-25 MED ORDER — ONETOUCH VERIO VI STRP: ORAL_STRIP | 12 refills | Status: DC

## 2019-03-25 NOTE — Progress Notes (Signed)
Subjective:     Patient ID: [name] , female    DOB: [birth year] , 83 y.o.   MRN: [id number]   Chief Complaint  Patient presents with  . Diabetes    HPI PT IS HERE with her niece FOR DM FU and has brought all her meds. She had been on Januvia 100 mg but has not been taking this for several months. She also has never started on simvastatin. She has brought all her medication bottles and we have updated her list. She does not have a renal Dr.    Past Medical History:  Diagnosis Date  . Acute diastolic heart failure ([place]) 06/15/2018  . Acute kidney injury ([place]) 01/22/2015  . Acute renal failure ([place])   . Acute respiratory failure with hypoxia ([place]) 06/14/2015  . Allergy   . Anemia    "when I was young"  . Anginal pain ([place])   . Anxiety   . Arthritis    "all over my body"   . Chest pain 04/07/2014  . Chronic bronchitis ([place])    "get it just about q yr" (06/14/2015)  . Chronic diastolic CHF (congestive heart failure) ([place])   . Chronic diastolic heart failure, NYHA class 1 ([place]) 01/22/2015  . Chronic lower back pain   . Chronic steroid use 12/11/2012  . Constipation 01/09/2017  . COPD (chronic obstructive pulmonary disease) ([place]) 01/09/2017  . Dehydration, moderate 12/11/2012  . 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 DM (diabetes mellitus) ([place]) 04/08/2014  . Dyspnea 08/10/2018  . Dysrhythmia    patient unsure what it is but has been told that.  . E-coli UTI 01/09/2017  . 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 Elevated lactic acid level 01/22/2015  . Essential hypertension 06/15/2018  . Exertional shortness of breath   . Frequent UTI    "recently" (06/14/2015)  . Gastric mass   . GERD (gastroesophageal reflux disease)   . Headache(784.0)    "weekly; wake up w/them " (110/19/2018)  . Heart murmur   . High cholesterol   . History of blood transfusion ~ 1954   "related to my periods"  . Hyperglycemia 12/11/2012  . Hypertension   . Intractable nausea and vomiting 05/15/2017  .  Migraine    "once/month maybe" (05/16/2017)  . Multiple falls 06/14/2015  . Noncompliance 12/11/2012  . Orthostasis 06/14/2015  . OSA (obstructive sleep apnea)    "I think I'm using a BiPAP" (06/14/2015)  . Sepsis ([place]) 01/09/2017  . Sepsis secondary to UTI ([place])   . Syncope and collapse 12/11/2012  . Tachycardia with heart rate 100-120 beats per minute 04/08/2014  . Type II diabetes mellitus ([place])   . Uncontrolled type 2 diabetes mellitus with complication ([place])   . UTI (lower urinary tract infection) 12/11/2012  . Weight loss 06/14/2015     Family History  Problem Relation Age of Onset  . Hypertension Mother   . Heart attack Father   . Diabetes Mellitus II Sister   . Hypertension Sister   . Cancer - Other Sister   . Migraines Niece   . Colon cancer Neg Hx      Current Outpatient Medications:  .  albuterol (ACCUNEB) 0.63 MG/3ML nebulizer solution, Take 2 ampules by nebulization every 6 (six) hours as needed for wheezing., Disp: , Rfl:  .  aspirin EC 81 MG tablet, Take 1 tablet (81 mg total) by mouth daily., Disp: , Rfl:  .  Aspirin-Caffeine (BC FAST PAIN RELIEF PO), Take 1 packet by mouth daily as needed (  shoulder pain)., Disp: , Rfl:  .  budesonide-formoterol (SYMBICORT) 160-4.5 MCG/ACT inhaler, Inhale 2 puffs into the lungs 2 (two) times daily., Disp: 1 Inhaler, Rfl: 2 .  dicyclomine (BENTYL) 20 MG tablet, Take 20 mg by mouth 2 (two) times daily., Disp: , Rfl: 1 .  empagliflozin (JARDIANCE) 25 MG TABS tablet, Take 25 mg by mouth daily., Disp: 90 tablet, Rfl: 0 .  hydroxychloroquine (PLAQUENIL) 200 MG tablet, Take 200 mg by mouth 2 (two) times daily., Disp: , Rfl: 0 .  hydrOXYzine (ATARAX/VISTARIL) 10 MG tablet, Take 10 mg by mouth at bedtime as needed., Disp: , Rfl:  .  hydrOXYzine (VISTARIL) 25 MG capsule, 1-2 qhs for sleep and anxiety, Disp: 30 capsule, Rfl: 0 .  ibuprofen (ADVIL,MOTRIN) 800 MG tablet, Take 800 mg by mouth daily as needed for pain., Disp: , Rfl:  .   lisinopril-hydrochlorothiazide (ZESTORETIC) 10-12.5 MG tablet, TAKE 1 TABLET BY MOUTH EVERY DAY, Disp: 90 tablet, Rfl: 0 .  methylPREDNISolone (MEDROL) 4 MG tablet, Take 2 mg by mouth daily. , Disp: , Rfl:  .  omeprazole (PRILOSEC) 20 MG capsule, Take 20 mg by mouth 2 (two) times daily., Disp: , Rfl: 0 .  pravastatin (PRAVACHOL) 20 MG tablet, Take 1 tablet (20 mg total) by mouth every evening. (Patient not taking: Reported on 02/18/2019), Disp: 90 tablet, Rfl: 0 .  PROAIR HFA 108 (90 Base) MCG/ACT inhaler, Inhale 2 puffs into the lungs 4 (four) times daily as needed for wheezing or shortness of breath., Disp: , Rfl: 2 .  Simethicone (GAS-X EXTRA STRENGTH PO), Take 180 mg by mouth daily., Disp: , Rfl:  .  traMADol (ULTRAM) 50 MG tablet, Take 50 mg daily as needed by mouth for moderate pain. , Disp: , Rfl:    No Known Allergies   Review of Systems   Today's Vitals   03/25/19 1515  BP: 132/74  Pulse: (!) 111  Temp: 98.6 F (37 C)  TempSrc: Oral  SpO2: 97%  Weight: 146 lb 9.6 oz (66.5 kg)  Height: 5\' 2"  (1.575 m)   Body mass index is 26.81 kg/m.   Objective:  Physical Exam   Constitutional: She is oriented to person, place, and time. She appears well-developed and well-nourished. No distress.  HENT:  Head: Normocephalic and atraumatic.  Right Ear: External ear normal.  Left Ear: External ear normal.  Nose: Nose normal.  Eyes: Conjunctivae are normal. Right eye exhibits no discharge. Left eye exhibits no discharge. No scleral icterus.  Neck: Neck supple. No thyromegaly present.  No carotid bruits bilaterally  Cardiovascular: Normal rate and regular rhythm.  No murmur heard. Pulmonary/Chest: Effort normal and breath sounds normal. No respiratory distress.  Musculoskeletal: Normal range of motion. She exhibits no edema.  Lymphadenopathy:    She has no cervical adenopathy.  Neurological: She is alert and oriented to person, place, and time.  Skin: Skin is warm and dry. Capillary  refill takes less than 2 seconds. No rash noted. She is not diaphoretic.  Psychiatric: She has a normal mood and affect. Her behavior is normal. Judgment and thought content normal.  Nursing note reviewed.    Assessment And Plan:     1. CKD (chronic kidney disease) stage 3, GFR 30-59 ml/min ([place])- sent to see Nephrology to get established and have close follow up.  2. Uncontrolled type 2 diabetes mellitus with complication [facility])      She will stay off the [place] as she has been and I gave her samples of  Rybelsus 3 mg qd. Medication precautions reviewed and this will be better for her kidneys.   3. Mixed hyperlipidemia- chronic I stated her on Cq10 to start for 2 weeks  Before she starts the simvastatin 10 mg. I will slowly increase it as her liver and kidney functions test are watched close.  She was also given a glucometer and needs to bring those readings with her in 2 weeks.    Rainna Nearhood RODRIGUEZ-SOUTHWORTH, PA-C    THE PATIENT IS ENCOURAGED TO PRACTICE SOCIAL DISTANCING DUE TO THE COVID-19 PANDEMIC.

## 2019-03-25 NOTE — Patient Instructions (Signed)
PLEASE BRING YOUR GLUCOSE MACHINE NEXT WEEK AND LET ME SEE YOUR READINGS  START CHOLESTEROL MEDICATION CALLED SIMVASTATIN IN 2 WEEKS, ONES YOU HAVE BEEN ON COq10 FOR 10 WEEKS, THEN CONTINUE THE COq10 WHILE ON THE CHOLESTEROL MEDICAITON.   I WILL BE SEEING YOU WEEKLY TO CHECK ON YOUR SUGARS AND KIDNEY FUNCTION.   THE THE NEW DIABETES MEDICATION CALLED RYBELSUS 30 MINUTES BEFORE YOU EAT, AND MAKE YOUR PORTIONS SMALLER, SO YOU WONT GET SICK IF YOU OVER EAT.

## 2019-04-01 ENCOUNTER — Other Ambulatory Visit: Payer: Self-pay

## 2019-04-01 MED ORDER — ATORVASTATIN CALCIUM 10 MG PO TABS: ORAL_TABLET | ORAL | 0 refills | Status: DC

## 2019-04-08 ENCOUNTER — Other Ambulatory Visit: Payer: Self-pay

## 2019-04-08 ENCOUNTER — Encounter: Payer: Self-pay | Admitting: Internal Medicine

## 2019-04-08 ENCOUNTER — Ambulatory Visit (INDEPENDENT_AMBULATORY_CARE_PROVIDER_SITE_OTHER): Payer: Medicare Other | Admitting: Internal Medicine

## 2019-04-08 VITALS — BP 134/88 | HR 98 | Temp 98.2°F | Ht 61.6 in | Wt 143.4 lb

## 2019-04-08 DIAGNOSIS — J449 Chronic obstructive pulmonary disease, unspecified: Secondary | ICD-10-CM

## 2019-04-08 DIAGNOSIS — G8929 Other chronic pain: Secondary | ICD-10-CM | POA: Diagnosis not present

## 2019-04-08 DIAGNOSIS — M05741 Rheumatoid arthritis with rheumatoid factor of right hand without organ or systems involvement: Secondary | ICD-10-CM | POA: Diagnosis not present

## 2019-04-08 DIAGNOSIS — M05742 Rheumatoid arthritis with rheumatoid factor of left hand without organ or systems involvement: Secondary | ICD-10-CM | POA: Diagnosis not present

## 2019-04-08 DIAGNOSIS — Z23 Encounter for immunization: Secondary | ICD-10-CM

## 2019-04-08 DIAGNOSIS — IMO0002 Reserved for concepts with insufficient information to code with codable children: Secondary | ICD-10-CM

## 2019-04-08 DIAGNOSIS — E1165 Type 2 diabetes mellitus with hyperglycemia: Secondary | ICD-10-CM

## 2019-04-08 NOTE — Progress Notes (Signed)
Subjective:     Patient ID: [name] , female    DOB: 07/06/1936 , 83 y.o.   MRN: [id number]   Chief Complaint  Patient presents with  . Diabetes    f/u    HPI  Pt is here for FU trial of Rybelssus 3 mg. She did not bring her glucometer today for me to see her readings. Her glucose has been in the 200's for 2 days then started going down. The lowest has been 120. She has felt nausesous and is eating small portions. She is not as badly nauseous if she  Eats before she takes the Rybelssus.  She has been by Rheumatology and prior to coming here she used to take tramadol, 20 pills/ month only when she had severe pain which was prescribed by her PCP. She wants to know if I could prescribe this, and we had talked about this when I met her and told her no since this practice does not prescribe chronic pain meds.   Past Medical History:  Diagnosis Date  . Acute diastolic heart failure ([place]) 06/15/2018  . Acute kidney injury ([place]) 01/22/2015  . Acute renal failure ([place])   . Acute respiratory failure with hypoxia ([place]) 06/14/2015  . Allergy   . Anemia    "when I was young"  . Anginal pain ([place])   . Anxiety   . Arthritis    "all over my body"   . Chest pain 04/07/2014  . Chronic bronchitis ([place])    "get it just about q yr" (06/14/2015)  . Chronic diastolic CHF (congestive heart failure) ([place])   . Chronic diastolic heart failure, NYHA class 1 ([place]) 01/22/2015  . Chronic lower back pain   . Chronic steroid use 12/11/2012  . Constipation 01/09/2017  . COPD (chronic obstructive pulmonary disease) ([place]) 01/09/2017  . Dehydration, moderate 12/11/2012  . 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 DM (diabetes mellitus) ([place]) 04/08/2014  . Dyspnea 08/10/2018  . Dysrhythmia    patient unsure what it is but has been told that.  . E-coli UTI 01/09/2017  . 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 Elevated lactic acid level 01/22/2015  . Essential hypertension 06/15/2018  . Exertional shortness of breath   .  Frequent UTI    "recently" (06/14/2015)  . Gastric mass   . GERD (gastroesophageal reflux disease)   . Headache(784.0)    "weekly; wake up w/them " (110/19/2018)  . Heart murmur   . High cholesterol   . History of blood transfusion ~ 1954   "related to my periods"  . Hyperglycemia 12/11/2012  . Hypertension   . Intractable nausea and vomiting 05/15/2017  . Migraine    "once/month maybe" (05/16/2017)  . Multiple falls 06/14/2015  . Noncompliance 12/11/2012  . Orthostasis 06/14/2015  . OSA (obstructive sleep apnea)    "I think I'm using a BiPAP" (06/14/2015)  . Sepsis ([place]) 01/09/2017  . Sepsis secondary to UTI ([place])   . Syncope and collapse 12/11/2012  . Tachycardia with heart rate 100-120 beats per minute 04/08/2014  . Type II diabetes mellitus ([place])   . Uncontrolled type 2 diabetes mellitus with complication ([place])   . UTI (lower urinary tract infection) 12/11/2012  . Weight loss 06/14/2015     Family History  Problem Relation Age of Onset  . Hypertension Mother   . Heart attack Father   . Diabetes Mellitus II Sister   . Hypertension Sister   . Cancer - Other Sister   . Migraines Niece   .  Colon cancer Neg Hx      Current Outpatient Medications:  .  albuterol (ACCUNEB) 0.63 MG/3ML nebulizer solution, Take 2 ampules by nebulization every 6 (six) hours as needed for wheezing., Disp: , Rfl:  .  aspirin EC 81 MG tablet, Take 1 tablet (81 mg total) by mouth daily., Disp: , Rfl:  .  Aspirin-Caffeine (BC FAST PAIN RELIEF PO), Take 1 packet by mouth daily as needed (shoulder pain)., Disp: , Rfl:  .  atorvastatin (LIPITOR) 10 MG tablet, One qd x 1 month, then 2 qd if tolerated, Disp: 90 tablet, Rfl: 0 .  budesonide-formoterol (SYMBICORT) 160-4.5 MCG/ACT inhaler, Inhale 2 puffs into the lungs 2 (two) times daily., Disp: 1 Inhaler, Rfl: 2 .  co-enzyme Q-10 30 MG capsule, Take 1 capsule (30 mg total) by mouth daily. To help your heart and prevent muscle cramps from cholesterol  medication, Disp: 90 capsule, Rfl: 3 .  dicyclomine (BENTYL) 20 MG tablet, Take 20 mg by mouth 2 (two) times daily., Disp: , Rfl: 1 .  glucose blood (ONETOUCH VERIO) test strip, Use to check blood sugar 3 to 4 times a day before meals, Disp: 100 each, Rfl: 12 .  hydrOXYzine (ATARAX/VISTARIL) 10 MG tablet, Take 10 mg by mouth at bedtime as needed., Disp: , Rfl:  .  Simethicone (GAS-X EXTRA STRENGTH PO), Take 180 mg by mouth daily., Disp: , Rfl:  .  hydrOXYzine (VISTARIL) 25 MG capsule, 1-2 qhs for sleep and anxiety (Patient not taking: Reported on 04/08/2019), Disp: 30 capsule, Rfl: 0 .  ibuprofen (ADVIL,MOTRIN) 800 MG tablet, Take 800 mg by mouth daily as needed for pain., Disp: , Rfl:  .  lisinopril-hydrochlorothiazide (ZESTORETIC) 10-12.5 MG tablet, TAKE 1 TABLET BY MOUTH EVERY DAY, Disp: 90 tablet, Rfl: 0 .  methylPREDNISolone (MEDROL) 4 MG tablet, Take 2 mg by mouth daily. , Disp: , Rfl:  .  omeprazole (PRILOSEC) 20 MG capsule, Take 20 mg by mouth 2 (two) times daily., Disp: , Rfl: 0 .  pravastatin (PRAVACHOL) 20 MG tablet, Take 1 tablet (20 mg total) by mouth every evening. (Patient not taking: Reported on 02/18/2019), Disp: 90 tablet, Rfl: 0 .  PROAIR HFA 108 (90 Base) MCG/ACT inhaler, Inhale 2 puffs into the lungs 4 (four) times daily as needed for wheezing or shortness of breath., Disp: , Rfl: 2 .  traMADol (ULTRAM) 50 MG tablet, Take 50 mg daily as needed by mouth for moderate pain. , Disp: , Rfl:    No Known Allergies   Review of Systems   + for nausea, denies vomiting, constipation, chest pains, SOB, wheezing, edema, would slow to heal or urinary difficulty.  Today's Vitals   04/08/19 1418  BP: 134/88  Pulse: 98  Temp: 98.2 F (36.8 C)  TempSrc: Oral  SpO2: 98%  Weight: 143 lb 6.4 oz (65 kg)  Height: 5' 1.6" (1.565 m)   Body mass index is 26.57 kg/m.   Objective:  Physical Exam  Wt is down 3 lbs.  Constitutional: She is oriented to person, place, and time. She appears  well-developed and well-nourished. No distress.  HENT:  Head: Normocephalic and atraumatic.  Right Ear: External ear normal.  Left Ear: External ear normal.  Nose: Nose normal.  Eyes: Conjunctivae are normal. Right eye exhibits no discharge. Left eye exhibits no discharge. No scleral icterus.  Neck: Neck supple. No thyromegaly present.  No carotid bruits bilaterally  Cardiovascular: Normal rate and regular rhythm.  No murmur heard. Pulmonary/Chest: Effort normal and breath  sounds normal. No respiratory distress.  Musculoskeletal: Normal range of motion. She exhibits no edema.  Lymphadenopathy:    She has no cervical adenopathy.  Neurological: She is alert and oriented to person, place, and time.  Skin: Skin is warm and dry. Capillary refill takes less than 2 seconds. No rash noted. She is not diaphoretic.  Psychiatric: She has a normal mood and affect. Her behavior is normal. Judgment and thought content normal.  Nursing note reviewed.  Assessment And Plan:  1. Need for influenza vaccination - Flu vaccine HIGH DOSE PF (Fluzone High dose)  2. Other chronic pain- chronic - Drug Screen 8 w/Conf, Ur  3. Uncontrolled type 2 diabetes mellitus with complication (HCC)-trying new medication and her numbers seemed improved, but needs to bring her glucometer next time in 2 weeks. I asked her if she wanted to d/c the Rybelssus, but she wanted to keep on with it.   4. Chronic obstructive pulmonary disease, unspecified COPD type ([place])- stable. No action.  5. Rheumatoid arthritis involving both hands with positive rheumatoid factor ([place])- chronic. I  discuss her case with Dr Baird Cancer and see if OK for me to prescribe her 20 Tramadol's per month and she agreed with this as long as her drug screen was clean. The PDMP shows the last time this was prescribed was 06/2018, and score is low.         Chia Rock RODRIGUEZ-SOUTHWORTH, PA-C    THE PATIENT IS ENCOURAGED TO PRACTICE SOCIAL DISTANCING DUE TO THE  COVID-19 PANDEMIC.

## 2019-04-08 NOTE — Patient Instructions (Signed)
PLEASE BRING YOUR GLUCOSE MACHINE NEXT TIME.

## 2019-04-09 LAB — DRUG SCREEN 8 W/CONF, UR
Amphetamines, Urine: NEGATIVE ng/mL
BENZODIAZ UR QL: NEGATIVE ng/mL
Barbiturate screen, urine: NEGATIVE ng/mL
Buprenorphine, Urine: NEGATIVE ng/mL
Cannabinoid Quant, Ur: NEGATIVE ng/mL
Cocaine (Metab.): NEGATIVE ng/mL
Methadone Screen, Urine: NEGATIVE ng/mL
OPIATE SCREEN URINE: NEGATIVE ng/mL

## 2019-04-12 ENCOUNTER — Other Ambulatory Visit: Payer: Self-pay | Admitting: Internal Medicine

## 2019-04-15 ENCOUNTER — Telehealth: Payer: Self-pay

## 2019-04-15 ENCOUNTER — Other Ambulatory Visit: Payer: Self-pay | Admitting: Internal Medicine

## 2019-04-15 MED ORDER — TRAMADOL HCL 50 MG PO TABS: 50.0000 mg | ORAL_TABLET | Freq: Two times a day (BID) | ORAL | 0 refills | Status: DC | PRN

## 2019-04-15 NOTE — Progress Notes (Unsigned)
Please fax Rx for Tramadol for pt and inform her that we are able to prescribe this for her, since it is a small amount.

## 2019-04-15 NOTE — Telephone Encounter (Signed)
Per Sunday Spillers  Please fax Rx for Tramadol for pt and inform her that we are able to prescribe this for her, since it is a small amount.  Spoke with pt she said thank you and bless you, she will go pick the prescription up

## 2019-04-22 ENCOUNTER — Ambulatory Visit (INDEPENDENT_AMBULATORY_CARE_PROVIDER_SITE_OTHER): Payer: Medicare Other | Admitting: Internal Medicine

## 2019-04-22 ENCOUNTER — Encounter: Payer: Self-pay | Admitting: Internal Medicine

## 2019-04-22 ENCOUNTER — Other Ambulatory Visit: Payer: Self-pay

## 2019-04-22 VITALS — BP 132/86 | HR 75 | Temp 98.3°F | Ht 61.6 in | Wt 140.4 lb

## 2019-04-22 DIAGNOSIS — I10 Essential (primary) hypertension: Secondary | ICD-10-CM

## 2019-04-22 DIAGNOSIS — G8929 Other chronic pain: Secondary | ICD-10-CM | POA: Insufficient documentation

## 2019-04-22 DIAGNOSIS — IMO0002 Reserved for concepts with insufficient information to code with codable children: Secondary | ICD-10-CM

## 2019-04-22 DIAGNOSIS — Z9119 Patient's noncompliance with other medical treatment and regimen: Secondary | ICD-10-CM

## 2019-04-22 DIAGNOSIS — E1165 Type 2 diabetes mellitus with hyperglycemia: Secondary | ICD-10-CM | POA: Diagnosis not present

## 2019-04-22 MED ORDER — RYBELSUS 3 MG PO TABS: 3.0000 mg | ORAL_TABLET | ORAL | 0 refills | Status: AC

## 2019-04-22 MED ORDER — HYDROXYZINE HCL 10 MG PO TABS: ORAL_TABLET | ORAL | 0 refills | Status: AC

## 2019-04-22 NOTE — Progress Notes (Signed)
Subjective:     Patient ID: [name] , female    DOB: 07-13-1936 , 83 y.o.   MRN: [id number]   Chief Complaint  Patient presents with  . Follow-up    Diabetes    HPI  Pt is here for FU DM and review her glucose readings. States she forgot her glucometer and admitted she does not know how to use it. Denies having any episodes of having weakness or sweating.  She is tolerating the Tramadol well and her pain is so much less and feels she is more functional.   Past Medical History:  Diagnosis Date  . Acute diastolic heart failure ([place]) 06/15/2018  . Acute kidney injury ([place]) 01/22/2015  . Acute renal failure ([place])   . Acute respiratory failure with hypoxia ([place]) 06/14/2015  . Allergy   . Anemia    "when I was young"  . Anginal pain ([place])   . Anxiety   . Arthritis    "all over my body"   . Chest pain 04/07/2014  . Chronic bronchitis ([place])    "get it just about q yr" (06/14/2015)  . Chronic diastolic CHF (congestive heart failure) ([place])   . Chronic diastolic heart failure, NYHA class 1 ([place]) 01/22/2015  . Chronic lower back pain   . Chronic steroid use 12/11/2012  . Constipation 01/09/2017  . COPD (chronic obstructive pulmonary disease) ([place]) 01/09/2017  . Dehydration, moderate 12/11/2012  . 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 DM (diabetes mellitus) ([place]) 04/08/2014  . Dyspnea 08/10/2018  . Dysrhythmia    patient unsure what it is but has been told that.  . E-coli UTI 01/09/2017  . 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 Elevated lactic acid level 01/22/2015  . Essential hypertension 06/15/2018  . Exertional shortness of breath   . Frequent UTI    "recently" (06/14/2015)  . Gastric mass   . GERD (gastroesophageal reflux disease)   . Headache(784.0)    "weekly; wake up w/them " (110/19/2018)  . Heart murmur   . High cholesterol   . History of blood transfusion ~ 1954   "related to my periods"  . Hyperglycemia 12/11/2012  . Hypertension   . Intractable nausea and vomiting  05/15/2017  . Migraine    "once/month maybe" (05/16/2017)  . Multiple falls 06/14/2015  . Noncompliance 12/11/2012  . Orthostasis 06/14/2015  . OSA (obstructive sleep apnea)    "I think I'm using a BiPAP" (06/14/2015)  . Sepsis ([place]) 01/09/2017  . Sepsis secondary to UTI ([place])   . Syncope and collapse 12/11/2012  . Tachycardia with heart rate 100-120 beats per minute 04/08/2014  . Type II diabetes mellitus ([place])   . Uncontrolled type 2 diabetes mellitus with complication ([place])   . UTI (lower urinary tract infection) 12/11/2012  . Weight loss 06/14/2015     Family History  Problem Relation Age of Onset  . Hypertension Mother   . Heart attack Father   . Diabetes Mellitus II Sister   . Hypertension Sister   . Cancer - Other Sister   . Migraines Niece   . Colon cancer Neg Hx      Current Outpatient Medications:  .  albuterol (ACCUNEB) 0.63 MG/3ML nebulizer solution, Take 2 ampules by nebulization every 6 (six) hours as needed for wheezing., Disp: , Rfl:  .  aspirin EC 81 MG tablet, Take 1 tablet (81 mg total) by mouth daily., Disp: , Rfl:  .  Aspirin-Caffeine (BC FAST PAIN RELIEF PO), Take 1 packet by mouth daily as  needed (shoulder pain)., Disp: , Rfl:  .  atorvastatin (LIPITOR) 10 MG tablet, One qd x 1 month, then 2 qd if tolerated, Disp: 90 tablet, Rfl: 0 .  budesonide-formoterol (SYMBICORT) 160-4.5 MCG/ACT inhaler, Inhale 2 puffs into the lungs 2 (two) times daily., Disp: 1 Inhaler, Rfl: 2 .  co-enzyme Q-10 30 MG capsule, Take 1 capsule (30 mg total) by mouth daily. To help your heart and prevent muscle cramps from cholesterol medication, Disp: 90 capsule, Rfl: 3 .  dicyclomine (BENTYL) 20 MG tablet, Take 20 mg by mouth 2 (two) times daily., Disp: , Rfl: 1 .  glucose blood (ONETOUCH VERIO) test strip, Use to check blood sugar 3 to 4 times a day before meals, Disp: 100 each, Rfl: 12 .  hydrOXYzine (ATARAX/VISTARIL) 10 MG tablet, Take 10 mg by mouth at bedtime as needed., Disp: , Rfl:   .  ibuprofen (ADVIL,MOTRIN) 800 MG tablet, Take 800 mg by mouth daily as needed for pain., Disp: , Rfl:  .  lisinopril-hydrochlorothiazide (ZESTORETIC) 10-12.5 MG tablet, TAKE 1 TABLET BY MOUTH EVERY DAY, Disp: 90 tablet, Rfl: 0 .  methylPREDNISolone (MEDROL) 4 MG tablet, Take 2 mg by mouth daily. , Disp: , Rfl:  .  omeprazole (PRILOSEC) 20 MG capsule, Take 20 mg by mouth 2 (two) times daily., Disp: , Rfl: 0 .  pravastatin (PRAVACHOL) 20 MG tablet, Take 1 tablet (20 mg total) by mouth every evening., Disp: 90 tablet, Rfl: 0 .  PROAIR HFA 108 (90 Base) MCG/ACT inhaler, Inhale 2 puffs into the lungs 4 (four) times daily as needed for wheezing or shortness of breath., Disp: , Rfl: 2 .  Simethicone (GAS-X EXTRA STRENGTH PO), Take 180 mg by mouth daily., Disp: , Rfl:  .  traMADol (ULTRAM) 50 MG tablet, Take 1 tablet (50 mg total) by mouth every 12 (twelve) hours as needed for moderate pain., Disp: 20 tablet, Rfl: 0   No Known Allergies   Review of Systems  Review of Systems  Constitutional: Negative for diaphoresis and unexpected weight change.  HENT: Negative for tinnitus.   Eyes: Negative for visual disturbance.  Respiratory: Negative for chest tightness and shortness of breath.   Cardiovascular: Negative for chest pain, palpitations and leg swelling.  Gastrointestinal: Negative for constipation, diarrhea and nausea.  Endocrine: Negative for polydipsia, polyphagia and polyuria.  Genitourinary: Negative for dysuria and frequency.  Skin: Negative for rash and wound.  Neurological: Negative for dizziness, speech difficulty, weakness, numbness and headaches.   Today's Vitals   04/22/19 1409  BP: 132/86  Pulse: 75  Temp: 98.3 F (36.8 C)  TempSrc: Oral  Weight: 140 lb 6.4 oz (63.7 kg)  Height: 5' 1.6" (1.565 m)   Body mass index is 26.01 kg/m.   Objective:  Physical Exam  Wt is down 3 lbs in 2 weeks.     Constitutional: She is oriented to person, place, and time. She appears  well-developed and well-nourished. No distress.  HENT:  Head: Normocephalic and atraumatic.  Right Ear: External ear normal.  Left Ear: External ear normal.  Nose: Nose normal.  Eyes: Conjunctivae are normal. Right eye exhibits no discharge. Left eye exhibits no discharge. No scleral icterus.  Neck: Neck supple. No thyromegaly present.  No carotid bruits bilaterally  Cardiovascular: Normal rate and regular rhythm.  No murmur heard. Pulmonary/Chest: Effort normal and breath sounds normal. No respiratory distress.  Musculoskeletal: Normal range of motion. She exhibits no edema.  Lymphadenopathy:    She has no cervical adenopathy.  Neurological: She is alert and oriented to person, place, and time.  Skin: Skin is warm and dry. Capillary refill takes less than 2 seconds. No rash noted. She is not diaphoretic.  Psychiatric: She has a normal mood and affect. Her behavior is normal. Judgment and thought content normal.  Nursing note reviewed.  Assessment And Plan:     1. Essential hypertension- stable. May continue same meds.   2. Uncontrolled type 2 diabetes mellitus with complication ([place])- chronic with non-compliance to bring her glucose readings. Tolerating Rybelssus fine.  Needs to see MA Monday to be taught to use her glucometer, and I will see her in 1 week to review those readings. I told her I will see her weekly until her Dm is controlled and I have to review her glucose weekly.  3. Other chronic pain- rheumatologic improving.      Josiephine Simao RODRIGUEZ-SOUTHWORTH, PA-C    THE PATIENT IS ENCOURAGED TO PRACTICE SOCIAL DISTANCING DUE TO THE COVID-19 PANDEMIC.

## 2019-04-22 NOTE — Patient Instructions (Signed)
PLEASE  COME BACK NEXT WEEK SO THE MEDICAL ASSISTANT CAN HELP YOU TO LEARN HOW TO USE YOUR SUGAR MACHINE  UNTIL I SEE THOSE READINGS YOU WILL NEED TO COME WEEKLY. TEST IT FASTING THEN 2 HOURS AFTER EATING EVERY DAY.

## 2019-04-29 ENCOUNTER — Other Ambulatory Visit: Payer: Self-pay

## 2019-04-29 ENCOUNTER — Ambulatory Visit (INDEPENDENT_AMBULATORY_CARE_PROVIDER_SITE_OTHER): Payer: Medicare Other | Admitting: Internal Medicine

## 2019-04-29 ENCOUNTER — Encounter: Payer: Self-pay | Admitting: Internal Medicine

## 2019-04-29 VITALS — BP 128/84 | HR 102 | Temp 98.5°F | Ht 62.0 in | Wt 139.8 lb

## 2019-04-29 DIAGNOSIS — J42 Unspecified chronic bronchitis: Secondary | ICD-10-CM | POA: Diagnosis not present

## 2019-04-29 DIAGNOSIS — IMO0002 Reserved for concepts with insufficient information to code with codable children: Secondary | ICD-10-CM

## 2019-04-29 DIAGNOSIS — E1165 Type 2 diabetes mellitus with hyperglycemia: Secondary | ICD-10-CM | POA: Diagnosis not present

## 2019-04-29 DIAGNOSIS — I10 Essential (primary) hypertension: Secondary | ICD-10-CM

## 2019-04-29 MED ORDER — RYBELSUS 7 MG PO TABS: ORAL_TABLET | ORAL | 0 refills | Status: DC

## 2019-04-29 NOTE — Progress Notes (Signed)
Subjective:     Patient ID: [name] , female    DOB: [birth year] , 83 y.o.   MRN: [id number]   Chief Complaint  Patient presents with  . Diabetes    f/u help using machine    HPI  Pt is here for DM FU, she was supposed to have her glucose diaries with her, but is here asking to be educated how to use the glucometer. She states she did not have a ride on Monday to come do the glucometer education. She is tolerating the Rybelsus well. Denies constipation or nausea. Denies any episodes of hypoglycemia or sweating for no reason   Past Medical History:  Diagnosis Date  . Acute diastolic heart failure ([place]) 06/15/2018  . Acute kidney injury ([place]) 01/22/2015  . Acute renal failure ([place])   . Acute respiratory failure with hypoxia ([place]) 06/14/2015  . Allergy   . Anemia    "when I was young"  . Anginal pain ([place])   . Anxiety   . Arthritis    "all over my body"   . Chest pain 04/07/2014  . Chronic bronchitis ([place])    "get it just about q yr" (06/14/2015)  . Chronic diastolic CHF (congestive heart failure) ([place])   . Chronic diastolic heart failure, NYHA class 1 ([place]) 01/22/2015  . Chronic lower back pain   . Chronic steroid use 12/11/2012  . Constipation 01/09/2017  . COPD (chronic obstructive pulmonary disease) ([place]) 01/09/2017  . Dehydration, moderate 12/11/2012  . 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 DM (diabetes mellitus) ([place]) 04/08/2014  . Dyspnea 08/10/2018  . Dysrhythmia    patient unsure what it is but has been told that.  . E-coli UTI 01/09/2017  . 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 Elevated lactic acid level 01/22/2015  . Essential hypertension 06/15/2018  . Exertional shortness of breath   . Frequent UTI    "recently" (06/14/2015)  . Gastric mass   . GERD (gastroesophageal reflux disease)   . Headache(784.0)    "weekly; wake up w/them " (110/19/2018)  . Heart murmur   . High cholesterol   . History of blood transfusion ~ 1954   "related to my periods"  .  Hyperglycemia 12/11/2012  . Hypertension   . Intractable nausea and vomiting 05/15/2017  . Migraine    "once/month maybe" (05/16/2017)  . Multiple falls 06/14/2015  . Noncompliance 12/11/2012  . Orthostasis 06/14/2015  . OSA (obstructive sleep apnea)    "I think I'm using a BiPAP" (06/14/2015)  . Sepsis ([place]) 01/09/2017  . Sepsis secondary to UTI ([place])   . Syncope and collapse 12/11/2012  . Tachycardia with heart rate 100-120 beats per minute 04/08/2014  . Type II diabetes mellitus ([place])   . Uncontrolled type 2 diabetes mellitus with complication ([place])   . UTI (lower urinary tract infection) 12/11/2012  . Weight loss 06/14/2015     Family History  Problem Relation Age of Onset  . Hypertension Mother   . Heart attack Father   . Diabetes Mellitus II Sister   . Hypertension Sister   . Cancer - Other Sister   . Migraines Niece   . Colon cancer Neg Hx      Current Outpatient Medications:  .  albuterol (ACCUNEB) 0.63 MG/3ML nebulizer solution, Take 2 ampules by nebulization every 6 (six) hours as needed for wheezing., Disp: , Rfl:  .  aspirin EC 81 MG tablet, Take 1 tablet (81 mg total) by mouth daily., Disp: , Rfl:  .  Aspirin-Caffeine (  BC FAST PAIN RELIEF PO), Take 1 packet by mouth daily as needed (shoulder pain)., Disp: , Rfl:  .  atorvastatin (LIPITOR) 10 MG tablet, One qd x 1 month, then 2 qd if tolerated, Disp: 90 tablet, Rfl: 0 .  budesonide-formoterol (SYMBICORT) 160-4.5 MCG/ACT inhaler, Inhale 2 puffs into the lungs 2 (two) times daily., Disp: 1 Inhaler, Rfl: 2 .  co-enzyme Q-10 30 MG capsule, Take 1 capsule (30 mg total) by mouth daily. To help your heart and prevent muscle cramps from cholesterol medication, Disp: 90 capsule, Rfl: 3 .  dicyclomine (BENTYL) 20 MG tablet, Take 20 mg by mouth 2 (two) times daily., Disp: , Rfl: 1 .  glucose blood (ONETOUCH VERIO) test strip, Use to check blood sugar 3 to 4 times a day before meals, Disp: 100 each, Rfl: 12 .  hydrOXYzine  (ATARAX/VISTARIL) 10 MG tablet, 1 q 8h prn itching, Disp: 30 tablet, Rfl: 0 .  ibuprofen (ADVIL,MOTRIN) 800 MG tablet, Take 800 mg by mouth daily as needed for pain., Disp: , Rfl:  .  lisinopril-hydrochlorothiazide (ZESTORETIC) 10-12.5 MG tablet, TAKE 1 TABLET BY MOUTH EVERY DAY, Disp: 90 tablet, Rfl: 0 .  methylPREDNISolone (MEDROL) 4 MG tablet, Take 2 mg by mouth daily. , Disp: , Rfl:  .  omeprazole (PRILOSEC) 20 MG capsule, Take 20 mg by mouth 2 (two) times daily., Disp: , Rfl: 0 .  pravastatin (PRAVACHOL) 20 MG tablet, Take 1 tablet (20 mg total) by mouth every evening., Disp: 90 tablet, Rfl: 0 .  PROAIR HFA 108 (90 Base) MCG/ACT inhaler, Inhale 2 puffs into the lungs 4 (four) times daily as needed for wheezing or shortness of breath., Disp: , Rfl: 2 .  Simethicone (GAS-X EXTRA STRENGTH PO), Take 180 mg by mouth daily., Disp: , Rfl:  .  traMADol (ULTRAM) 50 MG tablet, Take 1 tablet (50 mg total) by mouth every 12 (twelve) hours as needed for moderate pain., Disp: 20 tablet, Rfl: 0   No Known Allergies   Review of Systems  Review of Systems  Constitutional: Negative for diaphoresis and unexpected weight change.  HENT: Negative for tinnitus.   Eyes: Negative for visual disturbance.  Respiratory: Negative for chest tightness and shortness of breath.   Cardiovascular: Negative for chest pain, palpitations and leg swelling.  Gastrointestinal: Negative for constipation, diarrhea and nausea.  Endocrine: Negative for polydipsia, polyphagia and polyuria.  Genitourinary: Negative for dysuria and frequency.  Skin: Negative for rash and wound.  Neurological: Negative for dizziness, speech difficulty, weakness, numbness and headaches.    Today's Vitals   04/29/19 1413  BP: 128/84  Pulse: (!) 102  Temp: 98.5 F (36.9 C)  TempSrc: Oral  Weight: 139 lb 12.8 oz (63.4 kg)  Height: 5\' 2"  (1.575 m)   Body mass index is 25.57 kg/m.   Objective:  Physical Exam  Glucose 204    Constitutional:  She is oriented to person, place, and time. She appears well-developed and well-nourished. No distress.  HENT:  Head: Normocephalic and atraumatic.  Right Ear: External ear normal.  Left Ear: External ear normal.  Nose: Nose normal.  Eyes: Conjunctivae are normal. Right eye exhibits no discharge. Left eye exhibits no discharge. No scleral icterus.  Neck: Neck supple. No thyromegaly present.  No carotid bruits bilaterally  Cardiovascular: Normal rate and regular rhythm.  No murmur heard. Pulmonary/Chest: Effort normal and breath sounds normal. No respiratory distress.  Musculoskeletal: Normal range of motion. She exhibits no edema.  Lymphadenopathy:    She has  no cervical adenopathy.  Neurological: She is alert and oriented to person, place, and time.  Skin: Skin is warm and dry. Capillary refill takes less than 2 seconds. No rash noted. She is not diaphoretic.  Psychiatric: She has a normal mood and affect. Her behavior is normal. Judgment and thought content normal.  Nursing note reviewed. Assessment And Plan:    1. Chronic bronchitis, unspecified chronic bronchitis type ([place])- stable. No action  2. Essential hypertension- stable. May continue current med  3. Uncontrolled type 2 diabetes mellitus with complication ([place])- chronic. Will increase the Rybelsus to 7 mg next week.   MA educated her how to use a new glucometer pt bought herself. She could not use the one we gave her.  Fu in 2 weeks and needs to bring her glucometer readings.     Kase Shughart RODRIGUEZ-SOUTHWORTH, PA-C    THE PATIENT IS ENCOURAGED TO PRACTICE SOCIAL DISTANCING DUE TO THE COVID-19 PANDEMIC.

## 2019-05-04 ENCOUNTER — Telehealth: Payer: Self-pay

## 2019-05-04 NOTE — Telephone Encounter (Signed)
Called pt to let her know we have a coupon for her prescription Rybelsus 7mg  waiting upfront for her, pt stated she will pick it up when she come in for her appt

## 2019-05-06 ENCOUNTER — Other Ambulatory Visit: Payer: Self-pay | Admitting: Internal Medicine

## 2019-05-06 NOTE — Telephone Encounter (Signed)
Refill of tramadol you saw her today

## 2019-05-07 MED ORDER — TRAMADOL HCL 50 MG PO TABS: 50.0000 mg | ORAL_TABLET | Freq: Two times a day (BID) | ORAL | 0 refills | Status: DC | PRN

## 2019-05-07 NOTE — Telephone Encounter (Signed)
Pt is in agreement to only get 20 pills per month. She is not due for refill.

## 2019-05-19 ENCOUNTER — Emergency Department (HOSPITAL_COMMUNITY): Payer: Medicare Other

## 2019-05-19 ENCOUNTER — Other Ambulatory Visit: Payer: Self-pay

## 2019-05-19 ENCOUNTER — Encounter (HOSPITAL_COMMUNITY): Payer: Self-pay

## 2019-05-19 ENCOUNTER — Inpatient Hospital Stay (HOSPITAL_COMMUNITY)
Admission: EM | Admit: 2019-05-19 | Discharge: 2019-05-26 | DRG: 872 | Disposition: A | Discharge status: Home / Self Care | Payer: Medicare Other | Attending: Internal Medicine | Admitting: Internal Medicine

## 2019-05-19 DIAGNOSIS — K5900 Constipation, unspecified: Secondary | ICD-10-CM | POA: Diagnosis present

## 2019-05-19 DIAGNOSIS — K449 Diaphragmatic hernia without obstruction or gangrene: Secondary | ICD-10-CM | POA: Diagnosis present

## 2019-05-19 DIAGNOSIS — R748 Abnormal levels of other serum enzymes: Secondary | ICD-10-CM | POA: Diagnosis present

## 2019-05-19 DIAGNOSIS — I5032 Chronic diastolic (congestive) heart failure: Secondary | ICD-10-CM | POA: Diagnosis present

## 2019-05-19 DIAGNOSIS — A419 Sepsis, unspecified organism: Secondary | ICD-10-CM

## 2019-05-19 DIAGNOSIS — Z791 Long term (current) use of non-steroidal anti-inflammatories (NSAID): Secondary | ICD-10-CM

## 2019-05-19 DIAGNOSIS — E875 Hyperkalemia: Secondary | ICD-10-CM | POA: Diagnosis present

## 2019-05-19 DIAGNOSIS — Z79891 Long term (current) use of opiate analgesic: Secondary | ICD-10-CM

## 2019-05-19 DIAGNOSIS — R1084 Generalized abdominal pain: Secondary | ICD-10-CM

## 2019-05-19 DIAGNOSIS — N179 Acute kidney failure, unspecified: Secondary | ICD-10-CM | POA: Diagnosis present

## 2019-05-19 DIAGNOSIS — I10 Essential (primary) hypertension: Secondary | ICD-10-CM | POA: Diagnosis not present

## 2019-05-19 DIAGNOSIS — Z833 Family history of diabetes mellitus: Secondary | ICD-10-CM

## 2019-05-19 DIAGNOSIS — Z87891 Personal history of nicotine dependence: Secondary | ICD-10-CM

## 2019-05-19 DIAGNOSIS — R6520 Severe sepsis without septic shock: Secondary | ICD-10-CM | POA: Diagnosis present

## 2019-05-19 DIAGNOSIS — E785 Hyperlipidemia, unspecified: Secondary | ICD-10-CM

## 2019-05-19 DIAGNOSIS — Z79899 Other long term (current) drug therapy: Secondary | ICD-10-CM

## 2019-05-19 DIAGNOSIS — E872 Acidosis: Secondary | ICD-10-CM | POA: Diagnosis present

## 2019-05-19 DIAGNOSIS — J449 Chronic obstructive pulmonary disease, unspecified: Secondary | ICD-10-CM | POA: Diagnosis present

## 2019-05-19 DIAGNOSIS — Z9049 Acquired absence of other specified parts of digestive tract: Secondary | ICD-10-CM

## 2019-05-19 DIAGNOSIS — Z8709 Personal history of other diseases of the respiratory system: Secondary | ICD-10-CM | POA: Insufficient documentation

## 2019-05-19 DIAGNOSIS — R140 Abdominal distension (gaseous): Secondary | ICD-10-CM | POA: Diagnosis not present

## 2019-05-19 DIAGNOSIS — R109 Unspecified abdominal pain: Secondary | ICD-10-CM | POA: Diagnosis not present

## 2019-05-19 DIAGNOSIS — N183 Chronic kidney disease, stage 3 unspecified: Secondary | ICD-10-CM | POA: Diagnosis present

## 2019-05-19 DIAGNOSIS — I130 Hypertensive heart and chronic kidney disease with heart failure and stage 1 through stage 4 chronic kidney disease, or unspecified chronic kidney disease: Secondary | ICD-10-CM | POA: Diagnosis present

## 2019-05-19 DIAGNOSIS — Z20828 Contact with and (suspected) exposure to other viral communicable diseases: Secondary | ICD-10-CM | POA: Diagnosis present

## 2019-05-19 DIAGNOSIS — K648 Other hemorrhoids: Secondary | ICD-10-CM | POA: Diagnosis present

## 2019-05-19 DIAGNOSIS — E669 Obesity, unspecified: Secondary | ICD-10-CM | POA: Diagnosis present

## 2019-05-19 DIAGNOSIS — R112 Nausea with vomiting, unspecified: Secondary | ICD-10-CM

## 2019-05-19 DIAGNOSIS — K228 Other specified diseases of esophagus: Secondary | ICD-10-CM | POA: Diagnosis not present

## 2019-05-19 DIAGNOSIS — E1122 Type 2 diabetes mellitus with diabetic chronic kidney disease: Secondary | ICD-10-CM | POA: Diagnosis present

## 2019-05-19 DIAGNOSIS — E876 Hypokalemia: Secondary | ICD-10-CM | POA: Diagnosis present

## 2019-05-19 DIAGNOSIS — M1990 Unspecified osteoarthritis, unspecified site: Secondary | ICD-10-CM | POA: Diagnosis present

## 2019-05-19 DIAGNOSIS — M05741 Rheumatoid arthritis with rheumatoid factor of right hand without organ or systems involvement: Secondary | ICD-10-CM | POA: Diagnosis not present

## 2019-05-19 DIAGNOSIS — Z8249 Family history of ischemic heart disease and other diseases of the circulatory system: Secondary | ICD-10-CM

## 2019-05-19 DIAGNOSIS — K5730 Diverticulosis of large intestine without perforation or abscess without bleeding: Secondary | ICD-10-CM | POA: Diagnosis present

## 2019-05-19 DIAGNOSIS — Z7951 Long term (current) use of inhaled steroids: Secondary | ICD-10-CM

## 2019-05-19 DIAGNOSIS — Z7982 Long term (current) use of aspirin: Secondary | ICD-10-CM

## 2019-05-19 DIAGNOSIS — M069 Rheumatoid arthritis, unspecified: Secondary | ICD-10-CM | POA: Diagnosis present

## 2019-05-19 DIAGNOSIS — R197 Diarrhea, unspecified: Secondary | ICD-10-CM

## 2019-05-19 DIAGNOSIS — R17 Unspecified jaundice: Secondary | ICD-10-CM | POA: Diagnosis present

## 2019-05-19 DIAGNOSIS — E1169 Type 2 diabetes mellitus with other specified complication: Secondary | ICD-10-CM | POA: Diagnosis not present

## 2019-05-19 DIAGNOSIS — K219 Gastro-esophageal reflux disease without esophagitis: Secondary | ICD-10-CM | POA: Diagnosis present

## 2019-05-19 DIAGNOSIS — J9811 Atelectasis: Secondary | ICD-10-CM | POA: Diagnosis present

## 2019-05-19 DIAGNOSIS — M05742 Rheumatoid arthritis with rheumatoid factor of left hand without organ or systems involvement: Secondary | ICD-10-CM | POA: Diagnosis not present

## 2019-05-19 DIAGNOSIS — R29898 Other symptoms and signs involving the musculoskeletal system: Secondary | ICD-10-CM

## 2019-05-19 DIAGNOSIS — N390 Urinary tract infection, site not specified: Secondary | ICD-10-CM | POA: Diagnosis present

## 2019-05-19 DIAGNOSIS — D490 Neoplasm of unspecified behavior of digestive system: Secondary | ICD-10-CM | POA: Diagnosis not present

## 2019-05-19 DIAGNOSIS — G4733 Obstructive sleep apnea (adult) (pediatric): Secondary | ICD-10-CM | POA: Diagnosis present

## 2019-05-19 DIAGNOSIS — Q399 Congenital malformation of esophagus, unspecified: Secondary | ICD-10-CM | POA: Diagnosis not present

## 2019-05-19 DIAGNOSIS — Z90710 Acquired absence of both cervix and uterus: Secondary | ICD-10-CM

## 2019-05-19 LAB — COMPREHENSIVE METABOLIC PANEL
ALT: 25 U/L (ref 0–44)
AST: 44 U/L — ABNORMAL HIGH (ref 15–41)
Albumin: 3.7 g/dL (ref 3.5–5.0)
Alkaline Phosphatase: 52 U/L (ref 38–126)
Anion gap: 18 — ABNORMAL HIGH (ref 5–15)
BUN: 29 mg/dL — ABNORMAL HIGH (ref 8–23)
CO2: 15 mmol/L — ABNORMAL LOW (ref 22–32)
Calcium: 9.7 mg/dL (ref 8.9–10.3)
Chloride: 103 mmol/L (ref 98–111)
Creatinine, Ser: 4.93 mg/dL — ABNORMAL HIGH (ref 0.44–1.00)
GFR calc Af Amer: 9 mL/min — ABNORMAL LOW (ref >60)
GFR calc non Af Amer: 8 mL/min — ABNORMAL LOW (ref >60)
Glucose, Bld: 250 mg/dL — ABNORMAL HIGH (ref 70–99)
Potassium: 5.2 mmol/L — ABNORMAL HIGH (ref 3.5–5.1)
Sodium: 136 mmol/L (ref 135–145)
Total Bilirubin: 1.9 mg/dL — ABNORMAL HIGH (ref 0.3–1.2)
Total Protein: 7.3 g/dL (ref 6.5–8.1)

## 2019-05-19 LAB — CBC WITH DIFFERENTIAL/PLATELET
Abs Immature Granulocytes: 0.06 10*3/uL (ref 0.00–0.07)
Basophils Absolute: 0.1 10*3/uL (ref 0.0–0.1)
Basophils Relative: 1 %
Eosinophils Absolute: 0.1 10*3/uL (ref 0.0–0.5)
Eosinophils Relative: 1 %
HCT: 44.9 % (ref 36.0–46.0)
Hemoglobin: 14.6 g/dL (ref 12.0–15.0)
Immature Granulocytes: 1 %
Lymphocytes Relative: 28 %
Lymphs Abs: 2.9 10*3/uL (ref 0.7–4.0)
MCH: 29.9 pg (ref 26.0–34.0)
MCHC: 32.5 g/dL (ref 30.0–36.0)
MCV: 92 fL (ref 80.0–100.0)
Monocytes Absolute: 0.6 10*3/uL (ref 0.1–1.0)
Monocytes Relative: 6 %
Neutro Abs: 6.5 10*3/uL (ref 1.7–7.7)
Neutrophils Relative %: 63 %
Platelets: 375 10*3/uL (ref 150–400)
RBC: 4.88 MIL/uL (ref 3.87–5.11)
RDW: 13.9 % (ref 11.5–15.5)
WBC: 10.2 10*3/uL (ref 4.0–10.5)
nRBC: 0 % (ref 0.0–0.2)

## 2019-05-19 LAB — LIPASE, BLOOD: Lipase: 64 U/L — ABNORMAL HIGH (ref 11–51)

## 2019-05-19 LAB — PROTIME-INR
INR: 1 (ref 0.8–1.2)
Prothrombin Time: 13.1 seconds (ref 11.4–15.2)

## 2019-05-19 LAB — APTT: aPTT: 21 seconds — ABNORMAL LOW (ref 24–36)

## 2019-05-19 LAB — LACTIC ACID, PLASMA: Lactic Acid, Venous: 3.9 mmol/L (ref 0.5–1.9)

## 2019-05-19 MED ORDER — LACTATED RINGERS IV BOLUS (SEPSIS)
1000.0000 mL | Freq: Once | INTRAVENOUS | Status: AC
  Administered 2019-05-19: 1000 mL via INTRAVENOUS

## 2019-05-19 MED ORDER — SODIUM CHLORIDE 0.9 % IV SOLN
2.0000 g | Freq: Once | INTRAVENOUS | Status: AC
  Administered 2019-05-19: 2 g via INTRAVENOUS
  Filled 2019-05-19: qty 2

## 2019-05-19 MED ORDER — METRONIDAZOLE IN NACL 5-0.79 MG/ML-% IV SOLN
500.0000 mg | Freq: Once | INTRAVENOUS | Status: AC
  Administered 2019-05-19: 500 mg via INTRAVENOUS
  Filled 2019-05-19: qty 100

## 2019-05-19 MED ORDER — SODIUM CHLORIDE 0.9 % IV BOLUS
1000.0000 mL | Freq: Once | INTRAVENOUS | Status: AC
  Administered 2019-05-19: 1000 mL via INTRAVENOUS

## 2019-05-19 MED ORDER — HEPARIN SODIUM (PORCINE) 5000 UNIT/ML IJ SOLN
5000.0000 [IU] | Freq: Three times a day (TID) | INTRAMUSCULAR | Status: DC
  Administered 2019-05-20 – 2019-05-26 (×17): 5000 [IU] via SUBCUTANEOUS
  Filled 2019-05-19 (×16): qty 1

## 2019-05-19 MED ORDER — SODIUM CHLORIDE 0.9 % IV SOLN
1.0000 g | INTRAVENOUS | Status: DC
  Administered 2019-05-20 – 2019-05-21 (×2): 1 g via INTRAVENOUS
  Filled 2019-05-19 (×2): qty 1

## 2019-05-19 NOTE — H&P (Signed)
History and Physical    [name] [id number] DOB: 12/31/1935 DOA: 05/19/2019  PCP: Vicki Mattocks, PA-C  Patient coming from: Home, lives alone but has niece that lives nearby that checks up on her  I have personally briefly reviewed patient's old medical records in [place]  Chief Complaint: Dizziness and weakness  HPI: [name] is a 83 y.o. female with medical history significant of chronic diastolic heart failure, COPD, hypertension, type II diabetes, rheumatoid arthritis who presents with concerns of dizziness and weakness for the past 3 days.  She also noticed increased bowel movement.  Denies any diarrhea but reports that she is on MiraLAX and recently stopped this due to increased bowel movement.  Also has nausea and vomiting that started yesterday.  Has had decreased appetite. Notes epigastric abdominal pain.   Denies any fevers.  Denies any chest pain or shortness of breath.  No new cough or runny nose.   ED Course: She is afebrile and and hypotensive down to 80 over 40s but improved with 3 L of LR fluid resuscitation.  CBC showed no leukocytosis or anemia.  CMP showed mildly elevated potassium of 5.2.  Creatinine significantly elevated at 4.93 and BUN of 29.  Prior creatinine 2 months ago was at 1.45.  Mildly elevated total bilirubin of 1.9.  Anion gap of 18.  Lactate of 3.9. COVID test pending.  CT head was obtained by ED PA since patient endorsed occasional right upper extremity weakness but had no acute findings.  CT abdomen pelvis showed no evidence of any acute diverticulitis or urethral obstructions.   Review of Systems: Constitutional: No Weight Change, No Fever ENT/Mouth: No sore throat, No Rhinorrhea Eyes: No Eye Pain, No Vision Changes Cardiovascular: No Chest Pain, no SOB Respiratory: No Cough, No Sputum, No Wheezing, no Dyspnea  Gastrointestinal: + Nausea,+ Vomiting, No Diarrhea, No Constipation, + Pain Genitourinary: no Urinary  Incontinence, No Urgency, No Flank Pain Musculoskeletal: No Arthralgias, No Myalgias Skin: No Skin Lesions, No Pruritus, Neuro: no Weakness, No Numbness,  No Loss of Consciousness, No Syncope Psych: No Anxiety/Panic, No Depression, no decrease appetite Heme/Lymph: No Bruising, No Bleeding  Past Medical History:  Diagnosis Date  . Acute diastolic heart failure ([place]) 06/15/2018  . Acute kidney injury ([place]) 01/22/2015  . Acute renal failure ([place])   . Acute respiratory failure with hypoxia ([place]) 06/14/2015  . Allergy   . Anemia    "when I was young"  . Anginal pain ([place])   . Anxiety   . Arthritis    "all over my body"   . Chest pain 04/07/2014  . Chronic bronchitis ([place])    "get it just about q yr" (06/14/2015)  . Chronic diastolic CHF (congestive heart failure) ([place])   . Chronic diastolic heart failure, NYHA class 1 ([place]) 01/22/2015  . Chronic lower back pain   . Chronic steroid use 12/11/2012  . Constipation 01/09/2017  . COPD (chronic obstructive pulmonary disease) ([place]) 01/09/2017  . Dehydration, moderate 12/11/2012  . 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 DM (diabetes mellitus) ([place]) 04/08/2014  . Dyspnea 08/10/2018  . Dysrhythmia    patient unsure what it is but has been told that.  . E-coli UTI 01/09/2017  . 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 Elevated lactic acid level 01/22/2015  . Essential hypertension 06/15/2018  . Exertional shortness of breath   . Frequent UTI    "recently" (06/14/2015)  . Gastric mass   . GERD (gastroesophageal reflux disease)   . Headache(784.0)    "  weekly; wake up w/them " (110/19/2018)  . Heart murmur   . High cholesterol   . History of blood transfusion ~ 1954   "related to my periods"  . Hyperglycemia 12/11/2012  . Hypertension   . Intractable nausea and vomiting 05/15/2017  . Migraine    "once/month maybe" (05/16/2017)  . Multiple falls 06/14/2015  . Noncompliance 12/11/2012  . Orthostasis 06/14/2015  . OSA (obstructive sleep apnea)    "I  think I'm using a BiPAP" (06/14/2015)  . Sepsis ([place]) 01/09/2017  . Sepsis secondary to UTI ([place])   . Syncope and collapse 12/11/2012  . Tachycardia with heart rate 100-120 beats per minute 04/08/2014  . Type II diabetes mellitus ([place])   . Uncontrolled type 2 diabetes mellitus with complication ([place])   . UTI (lower urinary tract infection) 12/11/2012  . Weight loss 06/14/2015    Past Surgical History:  Procedure Laterality Date  . APPENDECTOMY  1970   Archie Endo 12/12/2010  . BACK SURGERY    . [place]   Archie Endo 12/12/2010  . EUS N/A 10/16/2017   Procedure: UPPER ENDOSCOPIC ULTRASOUND (EUS) RADIAL;  Surgeon: Milus Banister, MD;  Location: [facility];  Service: Endoscopy;  Laterality: N/A;  . FRACTURE SURGERY    . LAPAROSCOPIC CHOLECYSTECTOMY  1990's  . LUMBAR MICRODISCECTOMY Left 09/2006   L5-S1/notes 12/12/2010  . MULTIPLE EXTRACTIONS WITH ALVEOLOPLASTY Bilateral 06/13/2017   Procedure: MULTIPLE EXTRACTION WITH ALVEOLOPLASTY;  Surgeon: Diona Browner, DDS;  Location: [facility];  Service: Oral Surgery;  Laterality: Bilateral;  . MULTIPLE TOOTH EXTRACTIONS    . ORIF SHOULDER FRACTURE Left ~ 2009   "put cement down in it"   . TONSILLECTOMY  1950's?  . TOTAL ABDOMINAL HYSTERECTOMY  1979   Archie Endo 12/12/2010     reports that she has quit smoking. Her smoking use included cigarettes. She has never used smokeless tobacco. She reports previous alcohol use. She reports that she does not use drugs.  No Known Allergies  Family History  Problem Relation Age of Onset  . Hypertension Mother   . Heart attack Father   . Diabetes Mellitus II Sister   . Hypertension Sister   . Cancer - Other Sister   . Migraines Niece   . Colon cancer Neg Hx    Family history reviewed and not pertinent   Prior to Admission medications   Medication Sig Start Date End Date Taking? Authorizing Provider  dicyclomine (BENTYL) 20 MG tablet Take 20 mg by mouth 2 (two) times daily. 10/13/17  Yes  [provider]  hydrOXYzine (ATARAX/VISTARIL) 10 MG tablet 1 q 8h prn itching Patient taking differently: Take 10 mg by mouth every 8 (eight) hours as needed for itching.  04/22/19  Yes Rodriguez-Southworth, Sunday Spillers, PA-C  lisinopril-hydrochlorothiazide (ZESTORETIC) 10-12.5 MG tablet TAKE 1 TABLET BY MOUTH EVERY DAY Patient taking differently: Take 1 tablet by mouth daily.  04/12/19  Yes Rodriguez-Southworth, Sunday Spillers, PA-C  methylPREDNISolone (MEDROL) 4 MG tablet Take 4 mg by mouth daily.    Yes [provider]  omeprazole (PRILOSEC) 20 MG capsule Take 20 mg by mouth daily.  12/17/16  Yes [provider]  albuterol (ACCUNEB) 0.63 MG/3ML nebulizer solution Take 2 ampules by nebulization every 6 (six) hours as needed for wheezing.    [provider]  aspirin EC 81 MG tablet Take 1 tablet (81 mg total) by mouth daily. 04/09/14   Debbe Odea, MD  Aspirin-Caffeine (BC FAST PAIN RELIEF PO) Take 1 packet by mouth daily  as needed (shoulder pain).    [provider]  atorvastatin (LIPITOR) 10 MG tablet One qd x 1 month, then 2 qd if tolerated 04/01/19   Rodriguez-Southworth, Sunday Spillers, PA-C  budesonide-formoterol (SYMBICORT) 160-4.5 MCG/ACT inhaler Inhale 2 puffs into the lungs 2 (two) times daily. 09/10/18   Rodriguez-Southworth, Sunday Spillers, PA-C  co-enzyme Q-10 30 MG capsule Take 1 capsule (30 mg total) by mouth daily. To help your heart and prevent muscle cramps from cholesterol medication 03/25/19   Rodriguez-Southworth, Sunday Spillers, PA-C  glucose blood (ONETOUCH VERIO) test strip Use to check blood sugar 3 to 4 times a day before meals 03/25/19   Rodriguez-Southworth, Sunday Spillers, PA-C  ibuprofen (ADVIL,MOTRIN) 800 MG tablet Take 800 mg by mouth daily as needed for pain. 06/27/18   [provider]  pravastatin (PRAVACHOL) 20 MG tablet Take 1 tablet (20 mg total) by mouth every evening. 08/18/18 08/18/19  Rodriguez-Southworth, Sunday Spillers, PA-C  PROAIR HFA 108 (90 Base) MCG/ACT  inhaler Inhale 2 puffs into the lungs 4 (four) times daily as needed for wheezing or shortness of breath. 06/11/18   [provider]  Semaglutide (RYBELSUS) 7 MG TABS Take 1 tablet by mouth daily for 30 days, THEN 1 tablet daily. X 1 month, then 2 qd. 05/06/19 07/05/19  Rodriguez-Southworth, Sunday Spillers, PA-C  Simethicone (GAS-X EXTRA STRENGTH PO) Take 180 mg by mouth daily.    [provider]  traMADol (ULTRAM) 50 MG tablet Take 1 tablet (50 mg total) by mouth every 12 (twelve) hours as needed for moderate pain. 05/07/19   Rodriguez-SouthworthSunday Spillers, PA-C    Physical Exam: Vitals:   05/19/19 2130 05/19/19 2144 05/19/19 2145 05/19/19 2312  BP: (!) 92/55 (!) 92/55 112/90 109/72  Pulse: (!) 119 (!) 125 (!) 124 (!) 111  Resp: (!) 34 (!) 25 (!) 30 (!) 22  Temp:      TempSrc:      SpO2: 96% 100% 97% 100%  Weight:      Height:        Constitutional: NAD, calm, comfortable, well-appearing thin elderly female laying flat in bed Vitals:   05/19/19 2130 05/19/19 2144 05/19/19 2145 05/19/19 2312  BP: (!) 92/55 (!) 92/55 112/90 109/72  Pulse: (!) 119 (!) 125 (!) 124 (!) 111  Resp: (!) 34 (!) 25 (!) 30 (!) 22  Temp:      TempSrc:      SpO2: 96% 100% 97% 100%  Weight:      Height:       Eyes: PERRL, lids and conjunctivae normal ENMT: Mucous membranes are moist.  Neck: normal, supple, no masses Respiratory: clear to auscultation bilaterally, no wheezing, no crackles. Normal respiratory effort. No accessory muscle use.  Cardiovascular: Regular rate and rhythm, no murmurs / rubs / gallops. No extremity edema. 2+ pedal pulses.  Abdomen:  tenderness to epigastric region, no masses palpated.  No guarding or rebound tenderness.  Bowel sounds positive.  Musculoskeletal: no clubbing / cyanosis. No joint deformity upper and lower extremities. Good ROM, no contractures. Normal muscle tone.  Skin: no rashes, lesions, ulcers. No induration Neurologic: CN 2-12 grossly intact. Sensation intact.  Strength 5/5 in all 4.  Psychiatric: Normal judgment and insight. Alert and oriented x 3. Normal mood.     Labs on Admission: I have personally reviewed following labs and imaging studies  CBC: Recent Labs  Lab 05/19/19 1926  WBC 10.2  NEUTROABS 6.5  HGB 14.6  HCT 44.9  MCV 92.0  PLT 123456   Basic Metabolic Panel: Recent  Labs  Lab 05/19/19 1926  NA 136  K 5.2*  CL 103  CO2 15*  GLUCOSE 250*  BUN 29*  CREATININE 4.93*  CALCIUM 9.7   GFR: Estimated Creatinine Clearance: 7.7 mL/min (A) (by C-G formula based on SCr of 4.93 mg/dL (H)). Liver Function Tests: Recent Labs  Lab 05/19/19 1926  AST 44*  ALT 25  ALKPHOS 52  BILITOT 1.9*  PROT 7.3  ALBUMIN 3.7   Recent Labs  Lab 05/19/19 1926  LIPASE 64*   No results for input(s): AMMONIA in the last 168 hours. Coagulation Profile: Recent Labs  Lab 05/19/19 1926  INR 1.0   Cardiac Enzymes: No results for input(s): CKTOTAL, CKMB, CKMBINDEX, TROPONINI in the last 168 hours. BNP (last 3 results) Recent Labs    06/08/18 1158  PROBNP 98   HbA1C: No results for input(s): HGBA1C in the last 72 hours. CBG: No results for input(s): GLUCAP in the last 168 hours. Lipid Profile: No results for input(s): CHOL, HDL, LDLCALC, TRIG, CHOLHDL, LDLDIRECT in the last 72 hours. Thyroid Function Tests: No results for input(s): TSH, T4TOTAL, FREET4, T3FREE, THYROIDAB in the last 72 hours. Anemia Panel: No results for input(s): VITAMINB12, FOLATE, FERRITIN, TIBC, IRON, RETICCTPCT in the last 72 hours. Urine analysis:    Component Value Date/Time   COLORURINE YELLOW 08/23/2017 0915   APPEARANCEUR CLEAR 08/23/2017 0915   LABSPEC 1.004 (L) 08/23/2017 0915   PHURINE 7.0 08/23/2017 0915   GLUCOSEU NEGATIVE 08/23/2017 0915   HGBUR NEGATIVE 08/23/2017 0915   BILIRUBINUR negative 09/10/2018 1523   KETONESUR NEGATIVE 08/23/2017 0915   PROTEINUR Negative 09/10/2018 1523   PROTEINUR NEGATIVE 08/23/2017 0915   UROBILINOGEN 0.2  09/10/2018 1523   UROBILINOGEN 1.0 03/10/2015 1459   NITRITE positive 09/10/2018 1523   NITRITE POSITIVE (A) 08/23/2017 0915   LEUKOCYTESUR Negative 09/10/2018 1523    Radiological Exams on Admission: Ct Abdomen Pelvis Wo Contrast  Result Date: 05/19/2019 CLINICAL DATA:  Pt presents with abd pain, N/V/D x2 daysAbd pain, diverticulitis suspected EXAM: CT ABDOMEN AND PELVIS WITHOUT CONTRAST TECHNIQUE: Multidetector CT imaging of the abdomen and pelvis was performed following the standard protocol without IV contrast. COMPARISON:  CT 06/28/2018 FINDINGS: Lower chest: Mild linear atelectasis at the RIGHT lung base. Hepatobiliary: No focal hepatic lesion. Postcholecystectomy. No biliary dilatation. Pancreas: Pancreas is normal. No ductal dilatation. No pancreatic inflammation. Spleen: Normal spleen Adrenals/urinary tract: Adrenal glands and kidneys are normal. The ureters and bladder normal. Stomach/Bowel: Stomach, small-bowel and cecum normal. The ascending and transverse colon normal. Descending colon normal. No diverticulitis. Fluid in the rectum. Vascular/Lymphatic: Abdominal aorta is normal caliber with atherosclerotic calcification. There is no retroperitoneal or periportal lymphadenopathy. No pelvic lymphadenopathy. Reproductive: Post hysterectomy. Other: No free fluid. Musculoskeletal: Degenerative osteophytosis of the spine. Multiple levels of degenerative change spine. Augmentation lower thoracic spine. IMPRESSION: 1. No evidence of acute diverticulitis. 2. Fluid within the rectum. 3. No ureteral obstruction. Aortic Atherosclerosis (ICD10-I70.0). Electronically Signed   By: Suzy Bouchard M.D.   On: 05/19/2019 21:04   Ct Head Wo Contrast  Result Date: 05/19/2019 CLINICAL DATA:  Right upper extremity weakness EXAM: CT HEAD WITHOUT CONTRAST TECHNIQUE: Contiguous axial images were obtained from the base of the skull through the vertex without intravenous contrast. COMPARISON:  CT brain 05/18/2017  FINDINGS: Brain: No acute territorial infarction, hemorrhage, or intracranial mass. Atrophy and mild small vessel ischemic changes of the white matter. Stable ventricle size. Vascular: No hyperdense vessels.  Carotid vascular calcification Skull: Normal. Negative for fracture  or focal lesion. Sinuses/Orbits: No acute finding. Old fracture medial wall left orbit. Other: None IMPRESSION: 1. No CT evidence for acute intracranial abnormality. 2. Atrophy and small vessel ischemic changes of the white matter Electronically Signed   By: Donavan Foil M.D.   On: 05/19/2019 21:01   Dg Chest Port 1 View  Result Date: 05/19/2019 CLINICAL DATA:  Chest pain EXAM: PORTABLE CHEST 1 VIEW COMPARISON:  Chest radiograph dated 06/08/2018. FINDINGS: The heart size and mediastinal contours are within normal limits. Both lungs are clear. Vertebroplasty changes are seen in the lower thoracic spine. Degenerative changes are seen in the shoulders. IMPRESSION: No active cardiopulmonary disease. Electronically Signed   By: Zerita Boers M.D.   On: 05/19/2019 19:48    EKG: Independently reviewed.   Assessment/Plan Sepsis secondary to questionable intra-abdominal source -Lactic of 3.9 and hypotensive on admission -Received 3 L LR fluid resuscitation in the ED -Continue to trend lactic -Continue broad-spectrum antibiotics with cefepime and Flagyl -CT abdomen and pelvis did not show any findings.  Possibly this could be due to a viral process.  However will keep on broad-spectrum antibiotics pending results of blood culture and patient's clinical status.  Acute kidney injury -Creatinine significantly elevated on admission to 4.93 (prior of 1.45 few months ago.) -Prerenal versus ATN due to hypotension - no significant findings on UA -Check fractional excretion of sodium - follow BMP. Renal ultrasound and nephrology consult if no improvement  Metabolic acidosis -Anion gap of 19 on admission.  Likely due to elevated lactate  and acute kidney injury. -Continue to monitor.  Mild hyperkalemia - 5.2 on admission - monitor after fluids  Elevated bilirubin -Mildly elevated bilirubin of 1.9 admission -Likely secondary to hypoperfusion from hypotension -Monitor with CMP  Type 2 diabetes -low dose SSI  History of hypertension -Hold all antihypertensives due to hypotension       Orene Desanctis DO [facility]  If 7PM-7AM, please contact night-coverage [email or web address] Password [email or web address]  05/19/2019, 11:51 PM

## 2019-05-19 NOTE — Progress Notes (Signed)
Pharmacy Antibiotic Note  [name] is a 83 y.o. female admitted on 05/19/2019 with sepsis. Pt has PMH of DM Pharmacy has been consulted for cefepime dosing.  WBC is wnls at 10.2. Scr is significantly elevated at 4.93 (pt not on HD outpatient). Baseline Scr is ~ 1. Pt is hypothermic with a Tmax of 96.9  Plan: Cefepime IV 1g daily Monitor renal function, WBC, temp, and clinical status and adjust cefepime dose as necessary Continue Flagyl per MD  Height: 5\' 2"  (157.5 cm) Weight: 135 lb (61.2 kg) IBW/kg (Calculated) : 50.1  Temp (24hrs), Avg:96.9 F (36.1 C), Min:96.9 F (36.1 C), Max:96.9 F (36.1 C)  Recent Labs  Lab 05/19/19 1926  WBC 10.2  CREATININE 4.93*    Estimated Creatinine Clearance: 7.7 mL/min (A) (by C-G formula based on SCr of 4.93 mg/dL (H)).    No Known Allergies  Antimicrobials this admission: Cefepime 10/21 >>  Metronidazole 10/21 >>  Microbiology results: 10/21 BCx: Sent 10/21 UCx: Sent  Thank you for allowing pharmacy to be a part of this patient's care.  Sherren Kerns, PharmD PGY1 Acute Care Pharmacy Resident 05/19/2019 8:29 PM

## 2019-05-19 NOTE — ED Notes (Signed)
Date and time results received: 05/19/19 2116 (use smartphrase ".now" to insert current time)  Test: lactic acis Critical Value: 3.9  Name of Provider Notified: [place]  Orders Received? Or Actions Taken?: Orders Received - See Orders for details

## 2019-05-19 NOTE — ED Triage Notes (Signed)
Pt presents with abd pain, N/V/D x2 days

## 2019-05-19 NOTE — ED Notes (Signed)
Pt voided on self for 2nd time since arrival. Pt cleaned and linens changed. PurWick applied. Pt requested that pajama bottoms and panties be thrown in the trash because they were soiled; this nurse complied.

## 2019-05-20 ENCOUNTER — Ambulatory Visit: Payer: Medicare Other | Admitting: Internal Medicine

## 2019-05-20 LAB — COMPREHENSIVE METABOLIC PANEL
ALT: 18 U/L (ref 0–44)
AST: 27 U/L (ref 15–41)
Albumin: 2.9 g/dL — ABNORMAL LOW (ref 3.5–5.0)
Alkaline Phosphatase: 40 U/L (ref 38–126)
Anion gap: 14 (ref 5–15)
BUN: 31 mg/dL — ABNORMAL HIGH (ref 8–23)
CO2: 14 mmol/L — ABNORMAL LOW (ref 22–32)
Calcium: 8.3 mg/dL — ABNORMAL LOW (ref 8.9–10.3)
Chloride: 112 mmol/L — ABNORMAL HIGH (ref 98–111)
Creatinine, Ser: 3.59 mg/dL — ABNORMAL HIGH (ref 0.44–1.00)
GFR calc Af Amer: 13 mL/min — ABNORMAL LOW (ref >60)
GFR calc non Af Amer: 11 mL/min — ABNORMAL LOW (ref >60)
Glucose, Bld: 110 mg/dL — ABNORMAL HIGH (ref 70–99)
Potassium: 4.2 mmol/L (ref 3.5–5.1)
Sodium: 140 mmol/L (ref 135–145)
Total Bilirubin: 0.6 mg/dL (ref 0.3–1.2)
Total Protein: 5.7 g/dL — ABNORMAL LOW (ref 6.5–8.1)

## 2019-05-20 LAB — FRACTIONAL EXCRET-NA(24HR UR +SERUM)
Creatinine, Ser: 3.61 mg/dL — ABNORMAL HIGH (ref 0.44–1.00)
Creatinine, Urine: 370.99 mg/dL
Fractional Excretion of Na: 0 %
Sodium, Ur: 61 mmol/L
Sodium: 140 mmol/L (ref 135–145)

## 2019-05-20 LAB — CBG MONITORING, ED
Glucose-Capillary: 127 mg/dL — ABNORMAL HIGH (ref 70–99)
Glucose-Capillary: 103 mg/dL — ABNORMAL HIGH (ref 70–99)

## 2019-05-20 LAB — URINALYSIS, ROUTINE W REFLEX MICROSCOPIC
Bilirubin Urine: NEGATIVE
Glucose, UA: NEGATIVE mg/dL
Ketones, ur: 5 mg/dL — AB
Nitrite: NEGATIVE
Protein, ur: 30 mg/dL — AB
Specific Gravity, Urine: 1.018 (ref 1.005–1.030)
pH: 5 (ref 5.0–8.0)

## 2019-05-20 LAB — CBC
HCT: 37.2 % (ref 36.0–46.0)
Hemoglobin: 12 g/dL (ref 12.0–15.0)
MCH: 30.2 pg (ref 26.0–34.0)
MCHC: 32.3 g/dL (ref 30.0–36.0)
MCV: 93.7 fL (ref 80.0–100.0)
Platelets: 216 10*3/uL (ref 150–400)
RBC: 3.97 MIL/uL (ref 3.87–5.11)
RDW: 13.8 % (ref 11.5–15.5)
WBC: 9.8 10*3/uL (ref 4.0–10.5)
nRBC: 0 % (ref 0.0–0.2)

## 2019-05-20 LAB — GLUCOSE, CAPILLARY
Glucose-Capillary: 119 mg/dL — ABNORMAL HIGH (ref 70–99)
Glucose-Capillary: 118 mg/dL — ABNORMAL HIGH (ref 70–99)

## 2019-05-20 LAB — HEMOGLOBIN A1C
Hgb A1c MFr Bld: 7.9 % — ABNORMAL HIGH (ref 4.8–5.6)
Mean Plasma Glucose: 180.03 mg/dL

## 2019-05-20 LAB — LACTIC ACID, PLASMA: Lactic Acid, Venous: 2.3 mmol/L (ref 0.5–1.9)

## 2019-05-20 LAB — SARS CORONAVIRUS 2 (TAT 6-24 HRS): SARS Coronavirus 2: NEGATIVE

## 2019-05-20 MED ORDER — INSULIN ASPART 100 UNIT/ML ~~LOC~~ SOLN
0.0000 [IU] | Freq: Three times a day (TID) | SUBCUTANEOUS | Status: DC
  Administered 2019-05-21: 1 [IU] via SUBCUTANEOUS

## 2019-05-20 MED ORDER — MOMETASONE FURO-FORMOTEROL FUM 200-5 MCG/ACT IN AERO
2.0000 | INHALATION_SPRAY | Freq: Two times a day (BID) | RESPIRATORY_TRACT | Status: DC
  Administered 2019-05-20 – 2019-05-26 (×11): 2 via RESPIRATORY_TRACT
  Filled 2019-05-20: qty 8.8

## 2019-05-20 MED ORDER — STERILE WATER FOR INJECTION IV SOLN
INTRAVENOUS | Status: AC
  Administered 2019-05-20 (×2): via INTRAVENOUS
  Filled 2019-05-20 (×2): qty 850

## 2019-05-20 MED ORDER — HYOSCYAMINE SULFATE 0.125 MG SL SUBL
0.2500 mg | SUBLINGUAL_TABLET | Freq: Once | SUBLINGUAL | Status: AC
  Administered 2019-05-21: 0.25 mg via SUBLINGUAL
  Filled 2019-05-20: qty 2

## 2019-05-20 MED ORDER — PANTOPRAZOLE SODIUM 40 MG PO TBEC
40.0000 mg | DELAYED_RELEASE_TABLET | Freq: Every day | ORAL | Status: DC
  Administered 2019-05-20 – 2019-05-25 (×6): 40 mg via ORAL
  Filled 2019-05-20 (×6): qty 1

## 2019-05-20 MED ORDER — SIMETHICONE 80 MG PO CHEW
160.0000 mg | CHEWABLE_TABLET | Freq: Every day | ORAL | Status: DC
  Administered 2019-05-20 – 2019-05-23 (×4): 160 mg via ORAL
  Filled 2019-05-20 (×5): qty 2

## 2019-05-20 MED ORDER — METRONIDAZOLE IN NACL 5-0.79 MG/ML-% IV SOLN
500.0000 mg | Freq: Three times a day (TID) | INTRAVENOUS | Status: DC
  Administered 2019-05-20: 500 mg via INTRAVENOUS
  Filled 2019-05-20: qty 100

## 2019-05-20 MED ORDER — ONDANSETRON HCL 4 MG/2ML IJ SOLN
4.0000 mg | Freq: Four times a day (QID) | INTRAMUSCULAR | Status: DC | PRN
  Administered 2019-05-20 – 2019-05-26 (×3): 4 mg via INTRAVENOUS
  Filled 2019-05-20 (×3): qty 2

## 2019-05-20 MED ORDER — PRAVASTATIN SODIUM 10 MG PO TABS
20.0000 mg | ORAL_TABLET | Freq: Every evening | ORAL | Status: DC
  Administered 2019-05-20 – 2019-05-25 (×6): 20 mg via ORAL
  Filled 2019-05-20 (×6): qty 2

## 2019-05-20 MED ORDER — HYDROXYZINE HCL 10 MG PO TABS
10.0000 mg | ORAL_TABLET | Freq: Three times a day (TID) | ORAL | Status: DC | PRN
  Administered 2019-05-23 – 2019-05-25 (×4): 10 mg via ORAL
  Filled 2019-05-20 (×5): qty 1

## 2019-05-20 MED ORDER — HYOSCYAMINE SULFATE 0.125 MG/ML PO SOLN
0.2500 mg | Freq: Once | ORAL | Status: DC
  Filled 2019-05-20: qty 2

## 2019-05-20 MED ORDER — ALBUTEROL SULFATE (2.5 MG/3ML) 0.083% IN NEBU: 3.0000 mL | INHALATION_SOLUTION | Freq: Four times a day (QID) | RESPIRATORY_TRACT | Status: DC | PRN

## 2019-05-20 NOTE — ED Notes (Signed)
TELE 

## 2019-05-20 NOTE — ED Notes (Signed)
Patient is requesting nausea medicine. Attending notified via [place]

## 2019-05-20 NOTE — Progress Notes (Signed)
PROGRESS NOTE  Shawta Spearin [id number] DOB: [birth year] DOA: 05/19/2019 PCP: [name], PA-C  HPI/Recap of past 24 hours:  [name] is a 83 y.o. female with medical history significant of chronic diastolic heart failure, COPD, hypertension, type II diabetes, rheumatoid arthritis who presents with concerns of dizziness and weakness for the past 3 days.  She also noticed increased bowel movement.  Denies any diarrhea but reports that she is on MiraLAX and recently stopped this due to increased bowel movement.  Also has nausea and vomiting that started yesterday.  Has had decreased appetite. Notes epigastric abdominal pain.   Denies any fevers.  Denies any chest pain or shortness of breath.  No new cough or runny nose.   ED Course: She is afebrile and and hypotensive down to 80 over 40s but improved with 3 L of LR fluid resuscitation.  CBC showed no leukocytosis or anemia.  CMP showed mildly elevated potassium of 5.2.  Creatinine significantly elevated at 4.93 and BUN of 29.  Prior creatinine 2 months ago was at 1.45.  Mildly elevated total bilirubin of 1.9.  Anion gap of 18.  Lactate of 3.9. COVID test pending.  CT head was obtained by ED PA since patient endorsed occasional right upper extremity weakness but had no acute findings.  CT abdomen pelvis showed no evidence of any acute diverticulitis or urethral obstructions.  05/20/19: Patient was seen and examined at her bedside this morning in the ED.  She reports nausea and diffuse abdominal pain.  She had 3 loose bowel movements since she arrived to the hospital overnight.    States her last colonoscopy was about a year ago with [facility].  CT abdomen and pelvis without contrast unrevealing.  Was started on IV antibiotics empirically cefepime and IV Flagyl.  UA positive for pyuria, urine culture in process.  Assessment/Plan: Active Problems:   Sepsis ([place])  Sepsis secondary to presumed UTI versus possible  intra-abdominal process Presented with lactic acidosis, tachycardia, positive urine. CT abdomen pelvis remarkable unrevealing Urine analysis with pyuria, urine culture in process Blood cultures x2 peripherally negative in less than 24 hours. Continue IV cefepime empirically  AKI on CKD 3, likely secondary to poor renal perfusion Baseline creatinine 1.45 with GFR of 39 Presented with creatinine of 4.91 with GFR of 9 Creatinine 3.61 with GFR of 13 on 05/20/2019. Continue to avoid nephrotoxins like NSAIDs Maintain MAP greater than 65 to avoid renal hypoperfusion Closely monitor urine output Monitor electrolytes and start Daily BMPs  High anion gap metabolic acidosis Presented with chemistry bicarb of 15, anion gap of 18 and AKI Start isotonic bicarb at 75 cc/h Continue to monitor electrolytes Continue daily BMPs  Mild hyperkalemia Presented with potassium 5.2 Continue isotonic bicarb  Presumed UTI Management as stated above History of Klebsiella pneumonia and E. coli UTI in 2018  Intractable nausea frequent stool IV antiemetics Recently MiraLAX If symptoms persist will consult [facility]  Chronic diastolic CHF Appears euvolemic on exam Last 2D echo done on 09/07/2018 showed LVEF 6065% Start strict I's and O's and daily weight  Type 2 diabetes Well controlled Last A1c 8.2 on August 13 th 2020 Continue sensitive insulin sliding scale Avoid hypoglycemia in the setting of renal insufficiency and insulin use. Continue CBGs  Mildly elevated lipase level Continue to monitor No acute intra-abdominal findings in CT abdomen pelvis.    Code Status: Full code  Family Communication: None at bedside  Disposition Plan: Patient is currently not appropriate for discharge at  this time due to persistent symptomatology with ongoing work-up.  Patient will require at least 2 midnights for further assessment and treatment of present  condition.   Consultants:  None  Procedures:  None  Antimicrobials:  IV cefepime on 05/20/2019.  DVT prophylaxis: Subcu heparin 3 times daily.   Objective: Vitals:   05/20/19 0600 05/20/19 0647 05/20/19 0930 05/20/19 1112  BP: 113/60 100/60    Pulse: (!) 102 (!) 107 (!) 104 (!) 102  Resp: 20 (!) 22 (!) 25 (!) 23  Temp:      TempSrc:      SpO2: 96% 97% 99% 96%  Weight:      Height:        Intake/Output Summary (Last 24 hours) at 05/20/2019 1127 Last data filed at 05/20/2019 0250 Gross per 24 hour  Intake 1000 ml  Output 90 ml  Net 910 ml   Filed Weights   05/19/19 2028  Weight: 61.2 kg    Exam:  . General: 83 y.o. year-old female well developed well nourished in no acute distress.  Alert and oriented x3. . Cardiovascular: Regular rate and rhythm with no rubs or gallops.  No thyromegaly or JVD noted.   Marland Kitchen Respiratory: Clear to auscultation with no wheezes or rales. Good inspiratory effort. . Abdomen: Soft mild tenderness diffusely with palpation nondistended with normal bowel sounds x4 quadrants. . Musculoskeletal: No lower extremity edema. 2/4 pulses in all 4 extremities. Marland Kitchen Psychiatry: Mood is appropriate for condition and setting   Data Reviewed: CBC: Recent Labs  Lab 05/19/19 1926 05/20/19 0500  WBC 10.2 9.8  NEUTROABS 6.5  --   HGB 14.6 12.0  HCT 44.9 37.2  MCV 92.0 93.7  PLT 375 [id number]   Basic Metabolic Panel: Recent Labs  Lab 05/19/19 1926 05/20/19 0248 05/20/19 0253  NA 136 140 140  K 5.2* 4.2  --   CL 103 112*  --   CO2 15* 14*  --   GLUCOSE 250* 110*  --   BUN 29* 31*  --   CREATININE 4.93* 3.59* 3.61*  CALCIUM 9.7 8.3*  --    GFR: Estimated Creatinine Clearance: 10.5 mL/min (A) (by C-G formula based on SCr of 3.61 mg/dL (H)). Liver Function Tests: Recent Labs  Lab 05/19/19 1926 05/20/19 0248  AST 44* 27  ALT 25 18  ALKPHOS 52 40  BILITOT 1.9* 0.6  PROT 7.3 5.7*  ALBUMIN 3.7 2.9*   Recent Labs  Lab 05/19/19 1926   LIPASE 64*   No results for input(s): AMMONIA in the last 168 hours. Coagulation Profile: Recent Labs  Lab 05/19/19 1926  INR 1.0   Cardiac Enzymes: No results for input(s): CKTOTAL, CKMB, CKMBINDEX, TROPONINI in the last 168 hours. BNP (last 3 results) Recent Labs    06/08/18 1158  PROBNP 98   HbA1C: No results for input(s): HGBA1C in the last 72 hours. CBG: Recent Labs  Lab 05/20/19 0951  GLUCAP 127*   Lipid Profile: No results for input(s): CHOL, HDL, LDLCALC, TRIG, CHOLHDL, LDLDIRECT in the last 72 hours. Thyroid Function Tests: No results for input(s): TSH, T4TOTAL, FREET4, T3FREE, THYROIDAB in the last 72 hours. Anemia Panel: No results for input(s): VITAMINB12, FOLATE, FERRITIN, TIBC, IRON, RETICCTPCT in the last 72 hours. Urine analysis:    Component Value Date/Time   COLORURINE YELLOW 05/20/2019 0256   APPEARANCEUR HAZY (A) 05/20/2019 0256   LABSPEC 1.018 05/20/2019 0256   PHURINE 5.0 05/20/2019 0256   GLUCOSEU NEGATIVE 05/20/2019 0256   HGBUR  SMALL (A) 05/20/2019 0256   BILIRUBINUR NEGATIVE 05/20/2019 0256   BILIRUBINUR negative 09/10/2018 1523   KETONESUR 5 (A) 05/20/2019 0256   PROTEINUR 30 (A) 05/20/2019 0256   UROBILINOGEN 0.2 09/10/2018 1523   UROBILINOGEN 1.0 03/10/2015 1459   NITRITE NEGATIVE 05/20/2019 0256   LEUKOCYTESUR MODERATE (A) 05/20/2019 0256   Sepsis Labs: @LABRCNTIP (procalcitonin:4,lacticidven:4)  ) Recent Results (from the past 240 hour(s))  Blood Culture (routine x 2)     Status: None (Preliminary result)   Collection Time: 05/19/19  8:20 PM   Specimen: BLOOD  Result Value Ref Range Status   Specimen Description BLOOD LEFT ANTECUBITAL  Final   Special Requests   Final    BOTTLES DRAWN AEROBIC AND ANAEROBIC Blood Culture results may not be optimal due to an inadequate volume of blood received in culture bottles   Culture   Final    NO GROWTH < 12 HOURS Performed at [facility] [address], [place], [place]  [address]    Report Status PENDING  Incomplete  Blood Culture (routine x 2)     Status: None (Preliminary result)   Collection Time: 05/19/19  8:20 PM   Specimen: BLOOD LEFT FOREARM  Result Value Ref Range Status   Specimen Description BLOOD LEFT FOREARM  Final   Special Requests   Final    BOTTLES DRAWN AEROBIC AND ANAEROBIC Blood Culture results may not be optimal due to an inadequate volume of blood received in culture bottles   Culture   Final    NO GROWTH < 12 HOURS Performed at [facility], [place] [address], [place], [place] [address]    Report Status PENDING  Incomplete  SARS CORONAVIRUS 2 (TAT 6-24 HRS) Nasopharyngeal Nasopharyngeal Swab     Status: None   Collection Time: 05/19/19 10:21 PM   Specimen: Nasopharyngeal Swab  Result Value Ref Range Status   SARS Coronavirus 2 NEGATIVE NEGATIVE Final    Comment: (NOTE) SARS-CoV-2 target nucleic acids are NOT DETECTED. The SARS-CoV-2 RNA is generally detectable in upper and lower respiratory specimens during the acute phase of infection. Negative results do not preclude SARS-CoV-2 infection, do not rule out co-infections with other pathogens, and should not be used as the sole basis for treatment or other patient management decisions. Negative results must be combined with clinical observations, patient history, and epidemiological information. The expected result is Negative. Fact Sheet for Patients: SugarRoll.be Fact Sheet for Healthcare Providers: https://www.woods-mathews.com/ This test is not yet approved or cleared by the [place] FDA and  has been authorized for detection and/or diagnosis of SARS-CoV-2 by FDA under an Emergency Use Authorization (EUA). This EUA will remain  in effect (meaning this test can be used) for the duration of the COVID-19 declaration under Section 56 4(b)(1) of the Act, 21 U.S.C. section 360bbb-3(b)(1), unless the authorization is terminated  or revoked sooner. Performed at [facility], [place] [address], [place], [place] [address]       Studies: Ct Abdomen Pelvis Wo Contrast  Result Date: 05/19/2019 CLINICAL DATA:  Pt presents with abd pain, N/V/D x2 daysAbd pain, diverticulitis suspected EXAM: CT ABDOMEN AND PELVIS WITHOUT CONTRAST TECHNIQUE: Multidetector CT imaging of the abdomen and pelvis was performed following the standard protocol without IV contrast. COMPARISON:  CT 06/28/2018 FINDINGS: Lower chest: Mild linear atelectasis at the RIGHT lung base. Hepatobiliary: No focal hepatic lesion. Postcholecystectomy. No biliary dilatation. Pancreas: Pancreas is normal. No ductal dilatation. No pancreatic inflammation. Spleen: Normal spleen Adrenals/urinary  tract: Adrenal glands and kidneys are normal. The ureters and bladder normal. Stomach/Bowel: Stomach, small-bowel and cecum normal. The ascending and transverse colon normal. Descending colon normal. No diverticulitis. Fluid in the rectum. Vascular/Lymphatic: Abdominal aorta is normal caliber with atherosclerotic calcification. There is no retroperitoneal or periportal lymphadenopathy. No pelvic lymphadenopathy. Reproductive: Post hysterectomy. Other: No free fluid. Musculoskeletal: Degenerative osteophytosis of the spine. Multiple levels of degenerative change spine. Augmentation lower thoracic spine. IMPRESSION: 1. No evidence of acute diverticulitis. 2. Fluid within the rectum. 3. No ureteral obstruction. Aortic Atherosclerosis (ICD10-I70.0). Electronically Signed   By: Suzy Bouchard M.D.   On: 05/19/2019 21:04   Ct Head Wo Contrast  Result Date: 05/19/2019 CLINICAL DATA:  Right upper extremity weakness EXAM: CT HEAD WITHOUT CONTRAST TECHNIQUE: Contiguous axial images were obtained from the base of the skull through the vertex without intravenous contrast. COMPARISON:  CT brain 05/18/2017 FINDINGS: Brain: No acute territorial infarction, hemorrhage, or intracranial mass.  Atrophy and mild small vessel ischemic changes of the white matter. Stable ventricle size. Vascular: No hyperdense vessels.  Carotid vascular calcification Skull: Normal. Negative for fracture or focal lesion. Sinuses/Orbits: No acute finding. Old fracture medial wall left orbit. Other: None IMPRESSION: 1. No CT evidence for acute intracranial abnormality. 2. Atrophy and small vessel ischemic changes of the white matter Electronically Signed   By: Donavan Foil M.D.   On: 05/19/2019 21:01   Dg Chest Port 1 View  Result Date: 05/19/2019 CLINICAL DATA:  Chest pain EXAM: PORTABLE CHEST 1 VIEW COMPARISON:  Chest radiograph dated 06/08/2018. FINDINGS: The heart size and mediastinal contours are within normal limits. Both lungs are clear. Vertebroplasty changes are seen in the lower thoracic spine. Degenerative changes are seen in the shoulders. IMPRESSION: No active cardiopulmonary disease. Electronically Signed   By: Zerita Boers M.D.   On: 05/19/2019 19:48    Scheduled Meds: . heparin  5,000 Units Subcutaneous Q8H  . insulin aspart  0-9 Units Subcutaneous TID WC    Continuous Infusions: . ceFEPime (MAXIPIME) IV    .  sodium bicarbonate (isotonic) infusion in sterile water 75 mL/hr at 05/20/19 [id number]     LOS: 1 day     Kayleen Memos, MD [facility] Pager [phone]  If 7PM-7AM, please contact night-coverage [email or web address] Password [facility] 05/20/2019, 11:27 AM

## 2019-05-20 NOTE — ED Notes (Signed)
Tele ordered lunch

## 2019-05-20 NOTE — ED Notes (Signed)
Breakfast Ordered 

## 2019-05-20 NOTE — ED Provider Notes (Signed)
[facility] EMERGENCY DEPARTMENT Provider Note   CSN: [id number] Arrival date & time: 05/19/19  [id number]     History   Chief Complaint Chief Complaint  Patient presents with  . Abdominal Pain    HPI [name] is a 83 y.o. female.     HPI  83 year old female with a history of COPD, chronic diastolic heart failure, diabetes, hypertension, presents with concern for 2 to 3 days of abdominal pain, nausea, vomiting and diarrhea.  Patient reports periumbilical abdominal pain with radiation to the epigastrium which is sharp and severe.  Reports is been present for approximately 2 to 3 days.  Reports she is had associated nausea, vomiting and diarrhea.  Reports vomiting approximately 10 times per day at least, and reports associated diarrhea, with 4 episodes today.  Denies dysuria.  No known fevers.  Does report chronic cough, shortness of breath and chest pain for "2 to 3 years."  No known sick contacts or recent changes in medication.  Reports feeling generalized weakness.  On exam, right upper extremity weakness noted, patient reports this is also been present for approximately 2 days.  Denies any other numbness, weakness.  Denies falls or head trauma.  Does note mild headache.  Past Medical History:  Diagnosis Date  . Acute diastolic heart failure ([place]) 06/15/2018  . Acute kidney injury ([place]) 01/22/2015  . Acute renal failure ([place])   . Acute respiratory failure with hypoxia ([place]) 06/14/2015  . Allergy   . Anemia    "when I was young"  . Anginal pain ([place])   . Anxiety   . Arthritis    "all over my body"   . Chest pain 04/07/2014  . Chronic bronchitis ([place])    "get it just about q yr" (06/14/2015)  . Chronic diastolic CHF (congestive heart failure) ([place])   . Chronic diastolic heart failure, NYHA class 1 ([place]) 01/22/2015  . Chronic lower back pain   . Chronic steroid use 12/11/2012  . Constipation 01/09/2017  . COPD (chronic obstructive pulmonary disease) ([place])  01/09/2017  . Dehydration, moderate 12/11/2012  . 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 DM (diabetes mellitus) ([place]) 04/08/2014  . Dyspnea 08/10/2018  . Dysrhythmia    patient unsure what it is but has been told that.  . E-coli UTI 01/09/2017  . 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 Elevated lactic acid level 01/22/2015  . Essential hypertension 06/15/2018  . Exertional shortness of breath   . Frequent UTI    "recently" (06/14/2015)  . Gastric mass   . GERD (gastroesophageal reflux disease)   . Headache(784.0)    "weekly; wake up w/them " (110/19/2018)  . Heart murmur   . High cholesterol   . History of blood transfusion ~ 1954   "related to my periods"  . Hyperglycemia 12/11/2012  . Hypertension   . Intractable nausea and vomiting 05/15/2017  . Migraine    "once/month maybe" (05/16/2017)  . Multiple falls 06/14/2015  . Noncompliance 12/11/2012  . Orthostasis 06/14/2015  . OSA (obstructive sleep apnea)    "I think I'm using a BiPAP" (06/14/2015)  . Sepsis ([place]) 01/09/2017  . Sepsis secondary to UTI ([place])   . Syncope and collapse 12/11/2012  . Tachycardia with heart rate 100-120 beats per minute 04/08/2014  . Type II diabetes mellitus ([place])   . Uncontrolled type 2 diabetes mellitus with complication ([place])   . UTI (lower urinary tract infection) 12/11/2012  . Weight loss 06/14/2015    Patient Active Problem List  Diagnosis Date Noted  . History of COPD   . Other chronic pain 04/22/2019  . Dyspnea 08/10/2018  . Acute diastolic heart failure ([place]) 06/15/2018  . Essential hypertension 06/15/2018  . 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 Gastric mass   . Sepsis secondary to UTI ([place])   . Uncontrolled type 2 diabetes mellitus with complication ([place])   . Acute renal failure ([place])   . Chronic diastolic CHF (congestive heart failure) ([place])   . E-coli UTI 01/09/2017  . Sepsis ([place]) 01/09/2017  . Constipation 01/09/2017  . COPD (chronic obstructive pulmonary disease) ([place]) 01/09/2017  .  Multiple falls 06/14/2015  . Weight loss 06/14/2015  . Acute respiratory failure with hypoxia ([place]) 06/14/2015  . Orthostasis 06/14/2015  . Acute respiratory failure ([place]) 06/14/2015  . Elevated lactic acid level 01/22/2015  . 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 Chronic diastolic heart failure, NYHA class 1 ([place]) 01/22/2015  . Tachycardia with heart rate 100-120 beats per minute 04/08/2014  . DM (diabetes mellitus) ([place]) 04/08/2014  . Chest pain 04/07/2014  . Hyperglycemia 12/11/2012  . Syncope and collapse 12/11/2012  . Dehydration, moderate 12/11/2012  . Noncompliance 12/11/2012  . UTI (lower urinary tract infection) 12/11/2012  . Chronic steroid use 12/11/2012  . Rheumatoid arthritis ([place]) 12/11/2012    Past Surgical History:  Procedure Laterality Date  . APPENDECTOMY  1970   Archie Endo 12/12/2010  . BACK SURGERY    . [place]   Archie Endo 12/12/2010  . EUS N/A 10/16/2017   Procedure: UPPER ENDOSCOPIC ULTRASOUND (EUS) RADIAL;  Surgeon: Milus Banister, MD;  Location: [facility];  Service: Endoscopy;  Laterality: N/A;  . FRACTURE SURGERY    . LAPAROSCOPIC CHOLECYSTECTOMY  1990's  . LUMBAR MICRODISCECTOMY Left 09/2006   L5-S1/notes 12/12/2010  . MULTIPLE EXTRACTIONS WITH ALVEOLOPLASTY Bilateral 06/13/2017   Procedure: MULTIPLE EXTRACTION WITH ALVEOLOPLASTY;  Surgeon: Diona Browner, DDS;  Location: [facility];  Service: Oral Surgery;  Laterality: Bilateral;  . MULTIPLE TOOTH EXTRACTIONS    . ORIF SHOULDER FRACTURE Left ~ 2009   "put cement down in it"   . TONSILLECTOMY  1950's?  . TOTAL ABDOMINAL HYSTERECTOMY  1979   Archie Endo 12/12/2010     OB History   No obstetric history on file.      Home Medications    Prior to Admission medications   Medication Sig Start Date End Date Taking? Authorizing Provider  dicyclomine (BENTYL) 20 MG tablet Take 20 mg by mouth 2 (two) times daily. 10/13/17  Yes [provider]  hydrOXYzine (ATARAX/VISTARIL) 10 MG  tablet 1 q 8h prn itching Patient taking differently: Take 10 mg by mouth every 8 (eight) hours as needed for itching.  04/22/19  Yes Rodriguez-Southworth, Sunday Spillers, PA-C  lisinopril-hydrochlorothiazide (ZESTORETIC) 10-12.5 MG tablet TAKE 1 TABLET BY MOUTH EVERY DAY Patient taking differently: Take 1 tablet by mouth daily.  04/12/19  Yes Rodriguez-Southworth, Sunday Spillers, PA-C  methylPREDNISolone (MEDROL) 4 MG tablet Take 4 mg by mouth daily.    Yes [provider]  omeprazole (PRILOSEC) 20 MG capsule Take 20 mg by mouth daily.  12/17/16  Yes [provider]  albuterol (ACCUNEB) 0.63 MG/3ML nebulizer solution Take 2 ampules by nebulization every 6 (six) hours as needed for wheezing.    [provider]  aspirin EC 81 MG tablet Take 1 tablet (81 mg total) by mouth daily. 04/09/14   Debbe Odea, MD  Aspirin-Caffeine (BC FAST PAIN RELIEF PO) Take 1 packet by mouth daily as needed (shoulder pain).  [provider]  atorvastatin (LIPITOR) 10 MG tablet One qd x 1 month, then 2 qd if tolerated 04/01/19   Rodriguez-Southworth, Sunday Spillers, PA-C  budesonide-formoterol (SYMBICORT) 160-4.5 MCG/ACT inhaler Inhale 2 puffs into the lungs 2 (two) times daily. 09/10/18   Rodriguez-Southworth, Sunday Spillers, PA-C  co-enzyme Q-10 30 MG capsule Take 1 capsule (30 mg total) by mouth daily. To help your heart and prevent muscle cramps from cholesterol medication 03/25/19   Rodriguez-Southworth, Sunday Spillers, PA-C  glucose blood (ONETOUCH VERIO) test strip Use to check blood sugar 3 to 4 times a day before meals 03/25/19   Rodriguez-Southworth, Sunday Spillers, PA-C  ibuprofen (ADVIL,MOTRIN) 800 MG tablet Take 800 mg by mouth daily as needed for pain. 06/27/18   [provider]  pravastatin (PRAVACHOL) 20 MG tablet Take 1 tablet (20 mg total) by mouth every evening. 08/18/18 08/18/19  Rodriguez-Southworth, Sunday Spillers, PA-C  PROAIR HFA 108 (90 Base) MCG/ACT inhaler Inhale 2 puffs into the lungs 4 (four) times daily as needed  for wheezing or shortness of breath. 06/11/18   [provider]  Semaglutide (RYBELSUS) 7 MG TABS Take 1 tablet by mouth daily for 30 days, THEN 1 tablet daily. X 1 month, then 2 qd. 05/06/19 07/05/19  Rodriguez-Southworth, Sunday Spillers, PA-C  Simethicone (GAS-X EXTRA STRENGTH PO) Take 180 mg by mouth daily.    [provider]  traMADol (ULTRAM) 50 MG tablet Take 1 tablet (50 mg total) by mouth every 12 (twelve) hours as needed for moderate pain. 05/07/19   Rodriguez-Southworth, Sunday Spillers, PA-C    Family History Family History  Problem Relation Age of Onset  . Hypertension Mother   . Heart attack Father   . Diabetes Mellitus II Sister   . Hypertension Sister   . Cancer - Other Sister   . Migraines Niece   . Colon cancer Neg Hx     Social History Social History   Tobacco Use  . Smoking status: Former Smoker    Types: Cigarettes  . Smokeless tobacco: Never Used  . Tobacco comment: 05/16/2017"1 pack of cigarettes would last 3-4 months; haven't had any cigarettes for 40 years"  Substance Use Topics  . Alcohol use: Not Currently    Frequency: Never    Comment: 05/16/2017 "used to drink beer in the 1990's; don't drink at all now"  . Drug use: No     Allergies   Patient has no known allergies.   Review of Systems Review of Systems  Constitutional: Positive for fatigue. Negative for fever.  HENT: Negative for congestion.   Eyes: Negative for visual disturbance.  Respiratory: Positive for cough and shortness of breath.   Cardiovascular: Positive for chest pain (unchanged).  Gastrointestinal: Positive for abdominal pain, diarrhea, nausea and vomiting.  Genitourinary: Positive for decreased urine volume. Negative for difficulty urinating and dysuria.  Musculoskeletal: Negative for back pain.  Neurological: Positive for light-headedness. Negative for headaches.     Physical Exam Updated Vital Signs BP (!) 115/57   Pulse (!) 108   Temp (!) 96.9 F (36.1 C) (Temporal)    Resp (!) 24   Ht 5\' 2"  (1.575 m)   Wt 61.2 kg   SpO2 95%   BMI 24.69 kg/m   Physical Exam Vitals signs and nursing note reviewed.  Constitutional:      General: She is not in acute distress.    Appearance: She is well-developed. She is not diaphoretic.  HENT:     Head: Normocephalic and atraumatic.  Eyes:     Conjunctiva/sclera: Conjunctivae normal.  Neck:     Musculoskeletal: Normal range of motion.  Cardiovascular:     Rate and Rhythm: Normal rate and regular rhythm.     Heart sounds: Normal heart sounds. No murmur. No friction rub. No gallop.   Pulmonary:     Effort: Pulmonary effort is normal. No respiratory distress.     Breath sounds: Normal breath sounds. No wheezing or rales.  Abdominal:     General: There is no distension.     Palpations: Abdomen is soft.     Tenderness: There is abdominal tenderness in the epigastric area and left lower quadrant. There is no guarding.  Musculoskeletal:        General: No tenderness.  Skin:    General: Skin is warm and dry.     Findings: No erythema or rash.  Neurological:     Mental Status: She is alert and oriented to person, place, and time.      ED Treatments / Results  Labs (all labs ordered are listed, but only abnormal results are displayed) Labs Reviewed  LACTIC ACID, PLASMA - Abnormal; Notable for the following components:      Result Value   Lactic Acid, Venous 3.9 (*)    All other components within normal limits  LACTIC ACID, PLASMA - Abnormal; Notable for the following components:   Lactic Acid, Venous 2.3 (*)    All other components within normal limits  COMPREHENSIVE METABOLIC PANEL - Abnormal; Notable for the following components:   Potassium 5.2 (*)    CO2 15 (*)    Glucose, Bld 250 (*)    BUN 29 (*)    Creatinine, Ser 4.93 (*)    AST 44 (*)    Total Bilirubin 1.9 (*)    GFR calc non Af Amer 8 (*)    GFR calc Af Amer 9 (*)    Anion gap 18 (*)    All other components within normal limits  APTT -  Abnormal; Notable for the following components:   aPTT 21 (*)    All other components within normal limits  LIPASE, BLOOD - Abnormal; Notable for the following components:   Lipase 64 (*)    All other components within normal limits  CULTURE, BLOOD (ROUTINE X 2)  CULTURE, BLOOD (ROUTINE X 2)  URINE CULTURE  SARS CORONAVIRUS 2 (TAT 6-24 HRS)  CBC WITH DIFFERENTIAL/PLATELET  PROTIME-INR  URINALYSIS, ROUTINE W REFLEX MICROSCOPIC  CBC  BASIC METABOLIC PANEL    EKG EKG Interpretation  Date/Time:  Wednesday May 19 2019 19:01:36 EDT Ventricular Rate:  131 PR Interval:    QRS Duration: 79 QT Interval:  302 QTC Calculation: 446 R Axis:   -36 Text Interpretation:  Sinus tachycardia LAE, consider biatrial enlargement Left axis deviation Nonspecific T abnormalities, lateral leads Since prior ECG< rate has increased Confirmed by Gareth Morgan [phone]) on 05/20/2019 1:51:59 AM   Radiology Ct Abdomen Pelvis Wo Contrast  Result Date: 05/19/2019 CLINICAL DATA:  Pt presents with abd pain, N/V/D x2 daysAbd pain, diverticulitis suspected EXAM: CT ABDOMEN AND PELVIS WITHOUT CONTRAST TECHNIQUE: Multidetector CT imaging of the abdomen and pelvis was performed following the standard protocol without IV contrast. COMPARISON:  CT 06/28/2018 FINDINGS: Lower chest: Mild linear atelectasis at the RIGHT lung base. Hepatobiliary: No focal hepatic lesion. Postcholecystectomy. No biliary dilatation. Pancreas: Pancreas is normal. No ductal dilatation. No pancreatic inflammation. Spleen: Normal spleen Adrenals/urinary tract: Adrenal glands and kidneys are normal. The ureters and bladder normal. Stomach/Bowel: Stomach, small-bowel and cecum normal. The  ascending and transverse colon normal. Descending colon normal. No diverticulitis. Fluid in the rectum. Vascular/Lymphatic: Abdominal aorta is normal caliber with atherosclerotic calcification. There is no retroperitoneal or periportal lymphadenopathy. No pelvic  lymphadenopathy. Reproductive: Post hysterectomy. Other: No free fluid. Musculoskeletal: Degenerative osteophytosis of the spine. Multiple levels of degenerative change spine. Augmentation lower thoracic spine. IMPRESSION: 1. No evidence of acute diverticulitis. 2. Fluid within the rectum. 3. No ureteral obstruction. Aortic Atherosclerosis (ICD10-I70.0). Electronically Signed   By: Suzy Bouchard M.D.   On: 05/19/2019 21:04   Ct Head Wo Contrast  Result Date: 05/19/2019 CLINICAL DATA:  Right upper extremity weakness EXAM: CT HEAD WITHOUT CONTRAST TECHNIQUE: Contiguous axial images were obtained from the base of the skull through the vertex without intravenous contrast. COMPARISON:  CT brain 05/18/2017 FINDINGS: Brain: No acute territorial infarction, hemorrhage, or intracranial mass. Atrophy and mild small vessel ischemic changes of the white matter. Stable ventricle size. Vascular: No hyperdense vessels.  Carotid vascular calcification Skull: Normal. Negative for fracture or focal lesion. Sinuses/Orbits: No acute finding. Old fracture medial wall left orbit. Other: None IMPRESSION: 1. No CT evidence for acute intracranial abnormality. 2. Atrophy and small vessel ischemic changes of the white matter Electronically Signed   By: Donavan Foil M.D.   On: 05/19/2019 21:01   Dg Chest Port 1 View  Result Date: 05/19/2019 CLINICAL DATA:  Chest pain EXAM: PORTABLE CHEST 1 VIEW COMPARISON:  Chest radiograph dated 06/08/2018. FINDINGS: The heart size and mediastinal contours are within normal limits. Both lungs are clear. Vertebroplasty changes are seen in the lower thoracic spine. Degenerative changes are seen in the shoulders. IMPRESSION: No active cardiopulmonary disease. Electronically Signed   By: Zerita Boers M.D.   On: 05/19/2019 19:48    Procedures .Critical Care Performed by: Gareth Morgan, MD Authorized by: Gareth Morgan, MD   Critical care provider statement:    Critical care time  (minutes):  45   Critical care was necessary to treat or prevent imminent or life-threatening deterioration of the following conditions:  Sepsis and dehydration   Critical care was time spent personally by me on the following activities:  Discussions with consultants, evaluation of patient's response to treatment, examination of patient, ordering and performing treatments and interventions, ordering and review of laboratory studies, ordering and review of radiographic studies, pulse oximetry, re-evaluation of patient's condition, obtaining history from patient or surrogate and review of old charts   (including critical care time)  Medications Ordered in ED Medications  ceFEPIme (MAXIPIME) 1 g in sodium chloride 0.9 % 100 mL IVPB (has no administration in time range)  heparin injection 5,000 Units (has no administration in time range)  lactated ringers bolus 1,000 mL (0 mLs Intravenous Stopped 05/19/19 2140)    And  lactated ringers bolus 1,000 mL (0 mLs Intravenous Stopped 05/19/19 2315)  ceFEPIme (MAXIPIME) 2 g in sodium chloride 0.9 % 100 mL IVPB (0 g Intravenous Stopped 05/19/19 2139)  metroNIDAZOLE (FLAGYL) IVPB 500 mg (0 mg Intravenous Stopped 05/19/19 2140)  sodium chloride 0.9 % bolus 1,000 mL (0 mLs Intravenous Stopped 05/20/19 0136)     Initial Impression / Assessment and Plan / ED Course  I have reviewed the triage vital signs and the nursing notes.  Pertinent labs & imaging results that were available during my care of the patient were reviewed by me and considered in my medical decision making (see chart for details).        83 year old female with a history of COPD, chronic diastolic  heart failure, diabetes, hypertension, presents with concern for 2 to 3 days of abdominal pain, nausea, vomiting and diarrhea.  On arrival to the emergency department, patient noted to be tachycardic to the 130s with blood pressures 81/47.  On history, concern for possible sepsis secondary to  intra-abdominal or gastrointestinal source, or dehydration.  Code sepsis was initiated, patient received 30 cc/kg of IV fluids, as well as cefepime and Flagyl for presumed intra-abdominal source.  Initial lactic acid 3.9.  Labs significant for severe acute on chronic renal injury with a creatinine of 4.93, with associated metabolic acidosis. Glucose 250, but suspect more likely acidosis secondary to lactic acidosis and renal failure than DKA. Urine pending.  CT abdomen pelvis completed showing no sign of acute intra-abdominal abnormalities.  Of note, patient reports chronic chest pain, dyspnea, cough which are unchanged for several years.  She has equal bilateral upper lower extremity pulses on my exam, do not feel history and physical exam orders consistent with aortic dissection or ruptured AAA.  She is noted to have right upper extremity weakness on exam which she reports is new.  CT head completed shows no evidence of acute intracranial abnormality.  Recommend continued reevaluation as inpatient after stabilization of sepsis/dehydration.  BP improved to [id number] systolic, pulse [id number] after 3L of fluid.  COVID pending. Admitted for further care.   Final Clinical Impressions(s) / ED Diagnoses   Final diagnoses:  Sepsis with acute renal failure without septic shock, due to unspecified organism, unspecified acute renal failure type ([place])  AKI (acute kidney injury) ([place])  Generalized abdominal pain  Nausea vomiting and diarrhea  Weakness of right upper extremity    ED Discharge Orders    None       Gareth Morgan, MD 05/20/19 0157

## 2019-05-21 LAB — BASIC METABOLIC PANEL
Anion gap: 13 (ref 5–15)
BUN: 30 mg/dL — ABNORMAL HIGH (ref 8–23)
CO2: 26 mmol/L (ref 22–32)
Calcium: 8.2 mg/dL — ABNORMAL LOW (ref 8.9–10.3)
Chloride: 100 mmol/L (ref 98–111)
Creatinine, Ser: 1.83 mg/dL — ABNORMAL HIGH (ref 0.44–1.00)
GFR calc Af Amer: 29 mL/min — ABNORMAL LOW (ref >60)
GFR calc non Af Amer: 25 mL/min — ABNORMAL LOW (ref >60)
Glucose, Bld: 121 mg/dL — ABNORMAL HIGH (ref 70–99)
Potassium: 3 mmol/L — ABNORMAL LOW (ref 3.5–5.1)
Sodium: 139 mmol/L (ref 135–145)

## 2019-05-21 LAB — GLUCOSE, CAPILLARY
Glucose-Capillary: 123 mg/dL — ABNORMAL HIGH (ref 70–99)
Glucose-Capillary: 130 mg/dL — ABNORMAL HIGH (ref 70–99)
Glucose-Capillary: 97 mg/dL (ref 70–99)
Glucose-Capillary: 112 mg/dL — ABNORMAL HIGH (ref 70–99)

## 2019-05-21 LAB — URINE CULTURE: Culture: <10000 — AB

## 2019-05-21 MED ORDER — POTASSIUM CHLORIDE CRYS ER 20 MEQ PO TBCR
40.0000 meq | EXTENDED_RELEASE_TABLET | Freq: Two times a day (BID) | ORAL | Status: AC
  Administered 2019-05-21 (×2): 40 meq via ORAL
  Filled 2019-05-21 (×2): qty 2

## 2019-05-21 MED ORDER — TRAMADOL HCL 50 MG PO TABS
50.0000 mg | ORAL_TABLET | Freq: Once | ORAL | Status: AC
  Administered 2019-05-21: 50 mg via ORAL
  Filled 2019-05-21: qty 1

## 2019-05-21 MED ORDER — LACTATED RINGERS IV SOLN
INTRAVENOUS | Status: DC
  Administered 2019-05-21 – 2019-05-22 (×2): via INTRAVENOUS

## 2019-05-21 MED ORDER — ALUM & MAG HYDROXIDE-SIMETH 200-200-20 MG/5ML PO SUSP
30.0000 mL | Freq: Once | ORAL | Status: AC
  Administered 2019-05-21: 30 mL via ORAL
  Filled 2019-05-21: qty 30

## 2019-05-21 MED ORDER — SIMETHICONE 80 MG PO CHEW
160.0000 mg | CHEWABLE_TABLET | Freq: Once | ORAL | Status: AC
  Administered 2019-05-21: 160 mg via ORAL

## 2019-05-21 MED ORDER — LIDOCAINE VISCOUS HCL 2 % MT SOLN
15.0000 mL | Freq: Once | OROMUCOSAL | Status: AC
  Administered 2019-05-21: 15 mL via ORAL
  Filled 2019-05-21: qty 15

## 2019-05-21 NOTE — Progress Notes (Addendum)
PROGRESS NOTE  [name] [id number] DOB: 22-May-1936 DOA: 05/19/2019 PCP: [name], PA-C  HPI/Recap of past 24 hours:  [name] is a 83 y.o. female with medical history significant of chronic diastolic heart failure, COPD, hypertension, type II diabetes, rheumatoid arthritis who presents with concerns of dizziness and weakness for the past 3 days.  She also noticed increased bowel movement.  Denies any diarrhea but reports that she is on MiraLAX and recently stopped this due to increased bowel movement.  Also has nausea and vomiting that started yesterday.  Has had decreased appetite. Notes epigastric abdominal pain.   Denies any fevers.  Denies any chest pain or shortness of breath.  No new cough or runny nose.   ED Course: She is afebrile and and hypotensive down to 80 over 40s but improved with 3 L of LR fluid resuscitation.  CBC showed no leukocytosis or anemia.  CMP showed mildly elevated potassium of 5.2.  Creatinine significantly elevated at 4.93 and BUN of 29.  Prior creatinine 2 months ago was at 1.45.  Mildly elevated total bilirubin of 1.9.  Anion gap of 18.  Lactate of 3.9. COVID test pending.  CT head was obtained by ED PA since patient endorsed occasional right upper extremity weakness but had no acute findings.  CT abdomen pelvis showed no evidence of any acute diverticulitis or urethral obstructions.  05/20/19: Patient was seen and examined at her bedside this morning in the ED.  She reports nausea and diffuse abdominal pain.  She had 3 loose bowel movements since she arrived to the hospital overnight.    States her last colonoscopy was about a year ago with [facility].  CT abdomen and pelvis without contrast unrevealing.  Was started on IV antibiotics empirically cefepime and IV Flagyl.  UA positive for pyuria, urine culture in process.  05/21/19: Patient was seen and examined at her bedside.  She states her abdominal pain and nausea are improved.   Reports she still feels very weak.  PT OT evaluation.  Assessment/Plan: Active Problems:   Sepsis ([place])  Sepsis secondary to unclear etiology, possible intra-abdominal process Presented with lactic acidosis, tachycardia, positive urine. CT abdomen pelvis unrevealing Urine analysis with pyuria and negative urine culture Blood cultures x2 peripherally negative x2 days.   Currently on IV cefepime empirically, started on 05/19/2019.  Improving AKI on CKD 3, likely secondary to poor renal perfusion Baseline creatinine 1.45 with GFR of 39 Presented with creatinine of 4.91 with GFR of 9 Creatinine 3.61 with GFR of 13 on 05/20/2019. Creatinine 1.83 with GFR of 29 on 05/21/2019. Continue to avoid nephrotoxins Continue to monitor urine output Maintain map greater than 65 to avoid renal hypoprofusion Continue to monitor electrolytes and daily BMPs.  Generalized weakness/physical debility PT OT to assess Continue fall precaution Out of bed to chair every shift with assistance and fall precautions.  Resolving high anion gap metabolic acidosis Presented with chemistry bicarb of 15, anion gap of 18 and AKI Completed isotonic bicarb at 75 cc/h Chemistry bicarb 26 with anion gap of 13 on 05/21/2019. Continue to monitor electrolytes Continue daily BMPs  Hypokalemia Potassium 3.0 Repleted with p.o. KCl 40 mEq x 2.  Elevated lipase Lipase 64 on 05/19/2019 No evidence of pancreatitis on imaging No history of alcohol use Repeat lipase level due to location of pain  Pyuria Urine culture negative History of Klebsiella pneumonia and E. coli UTI in 2018  Resolving intractable nausea frequent stool IV antiemetics as needed Recently MiraLAX  Chronic diastolic CHF Appears euvolemic on exam Last 2D echo done on 09/07/2018 showed LVEF 6065% Continue strict I's and O's and daily weight  Type 2 diabetes Well controlled Last A1c 8.2 on August 13 th 2020 Continue sensitive insulin sliding  scale Avoid hypoglycemia in the setting of renal insufficiency and insulin use. Continue CBGs  Mildly elevated lipase level Continue to monitor No acute intra-abdominal findings in CT abdomen pelvis.    Code Status: Full code  Family Communication: None at bedside  Disposition Plan: Patient is currently not appropriate for discharge at this time due to persistent symptomatology with ongoing work-up.     Consultants:  None  Procedures:  None  Antimicrobials:  IV cefepime on 05/20/2019.  DVT prophylaxis: Subcu heparin 3 times daily.   Objective: Vitals:   05/20/19 2120 05/21/19 0442 05/21/19 0454 05/21/19 0756  BP:  113/70    Pulse: (!) 109 (!) 112 (!) 107   Resp:  19    Temp:  98.4 F (36.9 C)    TempSrc:      SpO2: 93% 97% 93% 97%  Weight:      Height:        Intake/Output Summary (Last 24 hours) at 05/21/2019 0850 Last data filed at 05/21/2019 0600 Gross per 24 hour  Intake 1551.98 ml  Output 101 ml  Net 1450.98 ml   Filed Weights   05/19/19 2028  Weight: 61.2 kg    Exam:  . General: 83 y.o. year-old female well-developed well-nourished no acute distress alert oriented x3.  \ . Cardiovascular: Regular rate and rhythm no rubs or gallops no JVD or thyromegaly..   . Respiratory: Clear to auscultation no wheezes or rales.  Poor inspiratory effort.   . Abdomen: Soft, tenderness with moderate palpation noted in epigastric region.  Nondistended with normal bowel sounds x4 quadrants. . Musculoskeletal: No lower extremity edema.  Peripheral pulses in all 4 extremities.   Marland Kitchen Psychiatry: Mood is appropriate for condition and setting.   Data Reviewed: CBC: Recent Labs  Lab 05/19/19 1926 05/20/19 0500  WBC 10.2 9.8  NEUTROABS 6.5  --   HGB 14.6 12.0  HCT 44.9 37.2  MCV 92.0 93.7  PLT 375 [id number]   Basic Metabolic Panel: Recent Labs  Lab 05/19/19 1926 05/20/19 0248 05/20/19 0253 05/21/19 0552  NA 136 140 140 139  K 5.2* 4.2  --  3.0*  CL 103  112*  --  100  CO2 15* 14*  --  26  GLUCOSE 250* 110*  --  121*  BUN 29* 31*  --  30*  CREATININE 4.93* 3.59* 3.61* 1.83*  CALCIUM 9.7 8.3*  --  8.2*   GFR: Estimated Creatinine Clearance: 20.7 mL/min (A) (by C-G formula based on SCr of 1.83 mg/dL (H)). Liver Function Tests: Recent Labs  Lab 05/19/19 1926 05/20/19 0248  AST 44* 27  ALT 25 18  ALKPHOS 52 40  BILITOT 1.9* 0.6  PROT 7.3 5.7*  ALBUMIN 3.7 2.9*   Recent Labs  Lab 05/19/19 1926  LIPASE 64*   No results for input(s): AMMONIA in the last 168 hours. Coagulation Profile: Recent Labs  Lab 05/19/19 1926  INR 1.0   Cardiac Enzymes: No results for input(s): CKTOTAL, CKMB, CKMBINDEX, TROPONINI in the last 168 hours. BNP (last 3 results) Recent Labs    06/08/18 1158  PROBNP 98   HbA1C: Recent Labs    05/20/19 0500  HGBA1C 7.9*   CBG: Recent Labs  Lab 05/20/19 0951 05/20/19 1211  05/20/19 1625 05/20/19 2051 05/21/19 0709  GLUCAP 127* 103* 119* 118* 123*   Lipid Profile: No results for input(s): CHOL, HDL, LDLCALC, TRIG, CHOLHDL, LDLDIRECT in the last 72 hours. Thyroid Function Tests: No results for input(s): TSH, T4TOTAL, FREET4, T3FREE, THYROIDAB in the last 72 hours. Anemia Panel: No results for input(s): VITAMINB12, FOLATE, FERRITIN, TIBC, IRON, RETICCTPCT in the last 72 hours. Urine analysis:    Component Value Date/Time   COLORURINE YELLOW 05/20/2019 0256   APPEARANCEUR HAZY (A) 05/20/2019 0256   LABSPEC 1.018 05/20/2019 0256   PHURINE 5.0 05/20/2019 0256   GLUCOSEU NEGATIVE 05/20/2019 0256   HGBUR SMALL (A) 05/20/2019 0256   BILIRUBINUR NEGATIVE 05/20/2019 0256   BILIRUBINUR negative 09/10/2018 1523   KETONESUR 5 (A) 05/20/2019 0256   PROTEINUR 30 (A) 05/20/2019 0256   UROBILINOGEN 0.2 09/10/2018 1523   UROBILINOGEN 1.0 03/10/2015 1459   NITRITE NEGATIVE 05/20/2019 0256   LEUKOCYTESUR MODERATE (A) 05/20/2019 0256   Sepsis  Labs: @LABRCNTIP (procalcitonin:4,lacticidven:4)  ) Recent Results (from the past 240 hour(s))  Blood Culture (routine x 2)     Status: None (Preliminary result)   Collection Time: 05/19/19  8:20 PM   Specimen: BLOOD  Result Value Ref Range Status   Specimen Description BLOOD LEFT ANTECUBITAL  Final   Special Requests   Final    BOTTLES DRAWN AEROBIC AND ANAEROBIC Blood Culture results may not be optimal due to an inadequate volume of blood received in culture bottles   Culture   Final    NO GROWTH 2 DAYS Performed at [facility], [place] [address] [place], [place] [address]    Report Status PENDING  Incomplete  Blood Culture (routine x 2)     Status: None (Preliminary result)   Collection Time: 05/19/19  8:20 PM   Specimen: BLOOD LEFT FOREARM  Result Value Ref Range Status   Specimen Description BLOOD LEFT FOREARM  Final   Special Requests   Final    BOTTLES DRAWN AEROBIC AND ANAEROBIC Blood Culture results may not be optimal due to an inadequate volume of blood received in culture bottles   Culture   Final    NO GROWTH 2 DAYS Performed at [facility], [place] [address] [place], [place] [address]    Report Status PENDING  Incomplete  SARS CORONAVIRUS 2 (TAT 6-24 HRS) Nasopharyngeal Nasopharyngeal Swab     Status: None   Collection Time: 05/19/19 10:21 PM   Specimen: Nasopharyngeal Swab  Result Value Ref Range Status   SARS Coronavirus 2 NEGATIVE NEGATIVE Final    Comment: (NOTE) SARS-CoV-2 target nucleic acids are NOT DETECTED. The SARS-CoV-2 RNA is generally detectable in upper and lower respiratory specimens during the acute phase of infection. Negative results do not preclude SARS-CoV-2 infection, do not rule out co-infections with other pathogens, and should not be used as the sole basis for treatment or other patient management decisions. Negative results must be combined with clinical observations, patient history, and epidemiological information. The  expected result is Negative. Fact Sheet for Patients: SugarRoll.be Fact Sheet for Healthcare Providers: https://www.woods-mathews.com/ This test is not yet approved or cleared by the [place] FDA and  has been authorized for detection and/or diagnosis of SARS-CoV-2 by FDA under an Emergency Use Authorization (EUA). This EUA will remain  in effect (meaning this test can be used) for the duration of the COVID-19 declaration under Section 56 4(b)(1) of the Act, 21 U.S.C. section 360bbb-3(b)(1), unless the authorization is terminated or revoked sooner.  Performed at [facility], [place] [address] [place], [place] [address]   Urine culture     Status: Abnormal   Collection Time: 05/20/19  2:28 AM   Specimen: In/Out Cath Urine  Result Value Ref Range Status   Specimen Description IN/OUT CATH URINE  Final   Special Requests NONE  Final   Culture (A)  Final    <10,000 COLONIES/mL INSIGNIFICANT GROWTH Performed at [facility], [place] [address] [place], [place] [address]    Report Status 05/21/2019 FINAL  Final      Studies: No results found.  Scheduled Meds: . heparin  5,000 Units Subcutaneous Q8H  . insulin aspart  0-9 Units Subcutaneous TID WC  . mometasone-formoterol  2 puff Inhalation BID  . pantoprazole  40 mg Oral Daily  . pravastatin  20 mg Oral QPM  . simethicone  160 mg Oral Daily    Continuous Infusions: . ceFEPime (MAXIPIME) IV 1 g (05/20/19 2034)     LOS: 2 days     Kayleen Memos, MD [facility] Pager [phone]  If 7PM-7AM, please contact night-coverage [email or web address] Password [email or web address] 05/21/2019, 8:50 AM

## 2019-05-21 NOTE — Evaluation (Signed)
Occupational Therapy Evaluation Patient Details Name: [name] MRN: [id number] DOB: [birth year] Today's Date: 05/21/2019    History of Present Illness Pt is an 83 yo female admitted with nausea, diarrhea, dizziness and weakness x2 days. Pt found to be septic.  Pt with PMH of CHF, COPD, HTN, DMII and RA.   Clinical Impression   Pt admitted with the above diagnosis and has the deficits outlined below. Pt would benefit from cont OT to increase independence with basic adls so she can safely d/c home alone. Pt has excellent support from niece and ex-husband but does not want [place] services.  Feel she would benefit greatly from these services and will continue conversations about this.  Pt will not have 24 hour supervision but exhusband states he will be over there "as much as needed."  Pt also very against the use of a walker or cane.  Pt did not lose balance walking in room without one but feel fatigue may be a factor if outside with no assistive device. Will follow acutely.    Follow Up Recommendations  Home health OT;Supervision/Assistance - 24 hour    Equipment Recommendations  None recommended by OT    Recommendations for Other Services       Precautions / Restrictions Precautions Precautions: Fall Precaution Comments: Last fall was 8 months ago. Restrictions Weight Bearing Restrictions: No      Mobility Bed Mobility Overal bed mobility: Modified Independent             General bed mobility comments: Pt used bedrail.  Transfers Overall transfer level: Needs assistance Equipment used: 1 person hand held assist Transfers: Sit to/from [facility] Sit to Stand: Min guard Stand pivot transfers: Min guard       General transfer comment: pt did not want to use cane or walker    Balance Overall balance assessment: History of Falls;Mild deficits observed, not formally tested                                         ADL either performed  or assessed with clinical judgement   ADL Overall ADL's : Needs assistance/impaired Eating/Feeding: Set up;Sitting Eating/Feeding Details (indicate cue type and reason): some set up opening packages due to RA Grooming: Oral care;Wash/dry face;Wash/dry hands;Supervision/safety;Standing Grooming Details (indicate cue type and reason): Pt stood at sink for 2 minutes before requesting to return to bed. Upper Body Bathing: Set up;Sitting   Lower Body Bathing: Minimal assistance;Sit to/from stand Lower Body Bathing Details (indicate cue type and reason): assist only in standing. Upper Body Dressing : Set up;Sitting   Lower Body Dressing: Minimal assistance;Sit to/from stand Lower Body Dressing Details (indicate cue type and reason): assist only in standing for balance. Toilet Transfer: Ambulation;Min Set designer Details (indicate cue type and reason): Pt walked to bathroom with IV pole only.  No overt LOB but pt states she feels weak and shakey. Toileting- Water quality scientist and Hygiene: Min guard;Sit to/from stand       Functional mobility during ADLs: Min guard General ADL Comments: Pt completes adls with very little assist. Pt states she just feels weak and a little bit unsteady.  Pt does live alone but family checks on her regularly.       Vision Baseline Vision/History: No visual deficits Patient Visual Report: No change from baseline Vision Assessment?: No apparent visual deficits  Perception Perception Perception Tested?: No   Praxis Praxis Praxis tested?: Within functional limits    Pertinent Vitals/Pain Pain Assessment: Faces Faces Pain Scale: Hurts little more Pain Location: abdomen Pain Descriptors / Indicators: Aching;Sore Pain Intervention(s): Limited activity within patient's tolerance;Monitored during session;Repositioned     Hand Dominance Right   Extremity/Trunk Assessment Upper Extremity Assessment Upper Extremity Assessment:  Generalized weakness   Lower Extremity Assessment Lower Extremity Assessment: Defer to PT evaluation   Cervical / Trunk Assessment Cervical / Trunk Assessment: Normal   Communication Communication Communication: No difficulties   Cognition Arousal/Alertness: Awake/alert Behavior During Therapy: WFL for tasks assessed/performed Overall Cognitive Status: Impaired/Different from baseline Area of Impairment: Orientation;Memory                 Orientation Level: Time   Memory: Decreased recall of precautions;Decreased short-term memory         General Comments: Pt thought "Bush" was the president.  Not certain about hosptial she was in and recalled 1/3 words given after 30 second delay.   General Comments  Pt most limited by weakness and fatigue. Has supportive family.    Exercises     Shoulder Instructions      Home Living Family/patient expects to be discharged to:: Private residence Living Arrangements: Alone Available Help at Discharge: Family;Available PRN/intermittently Type of Home: Apartment Home Access: Stairs to enter Entrance Stairs-Number of Steps: 3 Entrance Stairs-Rails: Right Home Layout: One level     Bathroom Shower/Tub: Tub/shower unit;Curtain   Bathroom Toilet: Standard     Home Equipment: Tub bench;Bedside commode   Additional Comments: Pt does not use assistive device to walk and does not want to.  Pt used to have [place] therapy after her fall 8 months ago and she does not like HH therapy.  She is adamently against it.        Prior Functioning/Environment Level of Independence: Independent with assistive device(s)        Comments: uses 3:1 and tub bench.  Does not drive.  Ex husband helpful with driving.        OT Problem List: Decreased strength;Decreased range of motion;Decreased activity tolerance;Impaired balance (sitting and/or standing);Decreased knowledge of use of DME or AE;Pain      OT Treatment/Interventions: Self-care/ADL  training;Therapeutic activities;DME and/or AE instruction;Balance training    OT Goals(Current goals can be found in the care plan section) Acute Rehab OT Goals Patient Stated Goal: to go home feeling better OT Goal Formulation: With patient Time For Goal Achievement: 06/04/19 Potential to Achieve Goals: Good ADL Goals Pt Will Perform Grooming: with modified independence;standing Pt Will Perform Tub/Shower Transfer: Tub transfer;with modified independence;ambulating;tub bench Additional ADL Goal #1: Pt will walk to bathroom with no assistive device and complete toileting on 3:1 over commode with modified independence. Additional ADL Goal #2: Pt will gather all clothing and dress self independently.  OT Frequency: Min 2X/week   Barriers to D/C: Decreased caregiver support  Pt lives alone and will not have 24/7 supervision. She has alot of family available to check in on her.         Co-evaluation              AM-PAC OT "6 Clicks" Daily Activity     Outcome Measure Help from another person eating meals?: None Help from another person taking care of personal grooming?: None Help from another person toileting, which includes using toliet, bedpan, or urinal?: A Little Help from another person bathing (including washing, rinsing, drying)?: A  Little Help from another person to put on and taking off regular upper body clothing?: None Help from another person to put on and taking off regular lower body clothing?: A Little 6 Click Score: 21   End of Session Nurse Communication: Mobility status  Activity Tolerance:   Patient left: in bed;with family/visitor present  OT Visit Diagnosis: Unsteadiness on feet (R26.81);Pain Pain - part of body: (abdomen)                Time: [id number] OT Time Calculation (min): 32 min Charges:  OT General Charges $OT Visit: 1 Visit OT Evaluation $OT Eval Moderate Complexity: 1 Mod OT Treatments $Self Care/Home Management : 8-22 mins  Glenford Peers 05/21/2019, 12:05 PM

## 2019-05-21 NOTE — Care Management Important Message (Signed)
Important Message  Patient Details  Name: Dannya Vargason MRN: [id number] Date of Birth: [birth year]   Medicare Important Message Given:  Yes     Shivansh Hardaway 05/21/2019, 3:03 PM

## 2019-05-21 NOTE — Plan of Care (Signed)
  Problem: Education: Goal: Knowledge of General Education information will improve Description: Including pain rating scale, medication(s)/side effects and non-pharmacologic comfort measures Outcome: Progressing   Problem: Activity: Goal: Risk for activity intolerance will decrease Outcome: Progressing   

## 2019-05-21 NOTE — Progress Notes (Signed)
PT Cancellation Note  Patient Details Name: Zinab Debacco MRN: [id number] DOB: 02-Jan-1936   Cancelled Treatment:    Reason Eval/Treat Not Completed: Medical issues which prohibited therapy Pt reports not feeling well and states she wants to wait until tomorrow for therapy. Will follow up as schedule allows.   Leighton Ruff, PT, DPT  Acute Rehabilitation Services  Pager: [phone] Office: [phone]  Rudean Hitt 05/21/2019, 1:48 PM

## 2019-05-22 LAB — BASIC METABOLIC PANEL
Anion gap: 12 (ref 5–15)
BUN: 24 mg/dL — ABNORMAL HIGH (ref 8–23)
CO2: 24 mmol/L (ref 22–32)
Calcium: 7.9 mg/dL — ABNORMAL LOW (ref 8.9–10.3)
Chloride: 102 mmol/L (ref 98–111)
Creatinine, Ser: 1.38 mg/dL — ABNORMAL HIGH (ref 0.44–1.00)
GFR calc Af Amer: 41 mL/min — ABNORMAL LOW (ref >60)
GFR calc non Af Amer: 36 mL/min — ABNORMAL LOW (ref >60)
Glucose, Bld: 110 mg/dL — ABNORMAL HIGH (ref 70–99)
Potassium: 3.9 mmol/L (ref 3.5–5.1)
Sodium: 138 mmol/L (ref 135–145)

## 2019-05-22 LAB — GLUCOSE, CAPILLARY
Glucose-Capillary: 107 mg/dL — ABNORMAL HIGH (ref 70–99)
Glucose-Capillary: 116 mg/dL — ABNORMAL HIGH (ref 70–99)
Glucose-Capillary: 117 mg/dL — ABNORMAL HIGH (ref 70–99)
Glucose-Capillary: 113 mg/dL — ABNORMAL HIGH (ref 70–99)

## 2019-05-22 LAB — LIPASE, BLOOD: Lipase: 103 U/L — ABNORMAL HIGH (ref 11–51)

## 2019-05-22 MED ORDER — ALUM & MAG HYDROXIDE-SIMETH 200-200-20 MG/5ML PO SUSP
30.0000 mL | Freq: Four times a day (QID) | ORAL | Status: DC | PRN
  Administered 2019-05-22 – 2019-05-25 (×3): 30 mL via ORAL
  Filled 2019-05-22 (×3): qty 30

## 2019-05-22 MED ORDER — LACTATED RINGERS IV SOLN
INTRAVENOUS | Status: AC
  Administered 2019-05-22 – 2019-05-23 (×2): via INTRAVENOUS

## 2019-05-22 MED ORDER — ACETAMINOPHEN 325 MG PO TABS
650.0000 mg | ORAL_TABLET | Freq: Four times a day (QID) | ORAL | Status: DC | PRN
  Filled 2019-05-22: qty 2

## 2019-05-22 MED ORDER — TRAMADOL HCL 50 MG PO TABS
50.0000 mg | ORAL_TABLET | Freq: Two times a day (BID) | ORAL | Status: DC | PRN
  Administered 2019-05-22 – 2019-05-23 (×2): 50 mg via ORAL
  Filled 2019-05-22 (×3): qty 1

## 2019-05-22 MED ORDER — TRAMADOL HCL 50 MG PO TABS
50.0000 mg | ORAL_TABLET | Freq: Once | ORAL | Status: AC
  Administered 2019-05-22: 50 mg via ORAL
  Filled 2019-05-22: qty 1

## 2019-05-22 MED ORDER — METHYLPREDNISOLONE 4 MG PO TABS
4.0000 mg | ORAL_TABLET | Freq: Every day | ORAL | Status: DC
  Administered 2019-05-22 – 2019-05-26 (×5): 4 mg via ORAL
  Filled 2019-05-22 (×5): qty 1

## 2019-05-22 NOTE — Progress Notes (Signed)
PT Cancellation Note  Patient Details Name: [name] MRN: [id number] DOB: Aug 09, 1935   Cancelled Treatment:    Reason Eval/Treat Not Completed: Other (comment). Pt adamantly declining participation with acute PT services despite max encouragement and education. Reports she has necessary assist at home, "and I can do everything I need to do by myself." Pt also adamantly declining follow-up HHPT services. Educ on importance of mobility and fall risk reduction. Will sign off.   Mabeline Caras, PT, DPT Acute Rehabilitation Services  Pager [phone] Office [place] 05/22/2019, 4:40 PM

## 2019-05-22 NOTE — Progress Notes (Signed)
PROGRESS NOTE  [name] [id number] DOB: 05/21/1936 DOA: 05/19/2019 PCP: [name], PA-C  HPI/Recap of past 24 hours:  [name] is a 83 y.o. female with medical history significant of chronic diastolic heart failure, COPD, hypertension, type II diabetes, rheumatoid arthritis who presents with concerns of dizziness and weakness for the past 3 days.  She also noticed increased bowel movement.  Denies any diarrhea but reports that she is on MiraLAX and recently stopped this due to increased bowel movement.  Also has nausea and vomiting that started yesterday.  Has had decreased appetite. Notes epigastric abdominal pain.   Denies any fevers.  Denies any chest pain or shortness of breath.  No new cough or runny nose.   ED Course: She is afebrile and and hypotensive down to 80 over 40s but improved with 3 L of LR fluid resuscitation.  CBC showed no leukocytosis or anemia.  CMP showed mildly elevated potassium of 5.2.  Creatinine significantly elevated at 4.93 and BUN of 29.  Prior creatinine 2 months ago was at 1.45.  Mildly elevated total bilirubin of 1.9.  Anion gap of 18.  Lactate of 3.9. COVID test pending.  CT head was obtained by ED PA since patient endorsed occasional right upper extremity weakness but had no acute findings.  CT abdomen pelvis showed no evidence of any acute diverticulitis or urethral obstructions.  Hospital course complicated intermittent abdominal pain worse in epigastric region.  Lipase trending up from 64 on 05/19/19 to 103 on 05/22/2019.  Last endoscopy and colonoscopy done at [facility] were unrevealing.  Completed 3 days of IV antibiotics cefepime and IV Flagyl empirically.  Afebrile with no leukocytosis.  05/22/19: Patient was seen and examined at her bedside this morning.  She states she is unable to tolerate a full liquid diet.  Has epigastric pain after eating.  Nausea improved with antiemetics.  Lipase level trending up.  Start gentle IV  fluid hydration.  Will repeat levels in the morning.  Assessment/Plan: Active Problems:   Sepsis ([place])  Sepsis secondary to unclear etiology, possible intra-abdominal process Presented with lactic acidosis, tachycardia, positive urine analysis. CT abdomen pelvis unrevealing Urine analysis with pyuria and negative urine culture Blood cultures x2 peripherally negative to date.   Completed 3 days of IV cefepime empirically, started on 05/19/2019, ending 05/22/2019.  Elevated lipase No sign of acute pancreatitis on CT abdomen and pelvis done on admission Lipase levels are trending up 103 on 05/22/2019 Denies history of alcohol use. Last LDL 243 on 08/13/18. Repeat lipase level in the morning Obtain CMP in the morning Restarted IV fluid, lactated Ringer at 75 cc/h x 1 day.  Improving AKI on CKD 3, likely secondary to poor renal perfusion Appears to be at his baseline creatinine 1.3 with GFR of 36 Renal function continues to improve Presented with creatinine of 4.91 with GFR of 9 Continue to avoid nephrotoxins Continue to monitor urine output Maintain map greater than 65 to avoid renal hypoprofusion Continue to monitor electrolytes and daily BMPs.  Rheumatoid arthritis Resume p.o. Medrol 4 mg daily and tramadol as needed  Generalized weakness/physical debility PT OT to assess OT recommended home health OT with 24 supervision and assistance. Continue fall precaution Out of bed to chair every shift with assistance and fall precautions.  Resolved high anion gap metabolic acidosis Presented with chemistry bicarb of 15, anion gap of 18 and AKI Completed isotonic bicarb at 75 cc/h Chemistry bicarb 26 with anion gap of 13 on 05/21/2019 Chemistry  bicarb 24 and anion gap of 12 on 05/22/2019 Continue to monitor electrolytes Continue daily BMPs  Resolved post repletion: Hypokalemia Potassium 3.0>> 3.9 on 05/22/2019. Repleted with p.o. KCl 40 mEq x 2.  Pyuria Urine culture  negative History of Klebsiella pneumonia and E. coli UTI in 2018  Resolving intractable nausea frequent stool Continue IV antiemetics as needed Recently MiraLAX  Chronic diastolic CHF Appears euvolemic on exam Last 2D echo done on 09/07/2018 showed LVEF 60-65% Continue strict I's and O's and daily weight Continue to monitor volume status  Type 2 diabetes Well controlled Last A1c 8.2 on August 13 th 2020 Continue sensitive insulin sliding scale Avoid hypoglycemia in the setting of renal insufficiency and insulin use. Continue CBGs    Code Status: Full code  Family Communication: None at bedside  Disposition Plan: Patient is currently not appropriate for discharge at this time due to persistent symptomatology with ongoing work-up.     Consultants:  None  Procedures:  None  Antimicrobials:  None.  DVT prophylaxis: Subcu heparin 3 times daily.   Objective: Vitals:   05/21/19 1700 05/21/19 2126 05/22/19 0531 05/22/19 0858  BP: (!) 101/59 (!) 111/44 123/60 112/60  Pulse: 100 (!) 108 99 93  Resp: 18 19 18 18   Temp: 98.2 F (36.8 C) 98.2 F (36.8 C) 98.3 F (36.8 C) 98.4 F (36.9 C)  TempSrc: Oral  Oral Oral  SpO2: 92% 91% 92% 91%  Weight:      Height:        Intake/Output Summary (Last 24 hours) at 05/22/2019 1614 Last data filed at 05/22/2019 1245 Gross per 24 hour  Intake 1537 ml  Output 0 ml  Net 1537 ml   Filed Weights   05/19/19 2028  Weight: 61.2 kg    Exam:  . General: 83 y.o. year-old female well-developed well-nourished in no acute distress.  Alert and oriented x3. . Cardiovascular: Regular rate and rhythm no rubs or gallops no JVD or thyromegaly noted.   Marland Kitchen Respiratory: Clear to auscultation no wheezes or rales.  Poor inspiratory effort. . Abdomen: Tenderness on palpation of epigastric region.  Bowel sounds present.   . Musculoskeletal: No lower extremity edema.  2 out of 4 pulses in all 4 extremities.   Marland Kitchen Psychiatry: Mood is  appropriate for condition and setting.   Data Reviewed: CBC: Recent Labs  Lab 05/19/19 1926 05/20/19 0500  WBC 10.2 9.8  NEUTROABS 6.5  --   HGB 14.6 12.0  HCT 44.9 37.2  MCV 92.0 93.7  PLT 375 123XX123   Basic Metabolic Panel: Recent Labs  Lab 05/19/19 1926 05/20/19 0248 05/20/19 0253 05/21/19 0552 05/22/19 0357  NA 136 140 140 139 138  K 5.2* 4.2  --  3.0* 3.9  CL 103 112*  --  100 102  CO2 15* 14*  --  26 24  GLUCOSE 250* 110*  --  121* 110*  BUN 29* 31*  --  30* 24*  CREATININE 4.93* 3.59* 3.61* 1.83* 1.38*  CALCIUM 9.7 8.3*  --  8.2* 7.9*   GFR: Estimated Creatinine Clearance: 27.5 mL/min (A) (by C-G formula based on SCr of 1.38 mg/dL (H)). Liver Function Tests: Recent Labs  Lab 05/19/19 1926 05/20/19 0248  AST 44* 27  ALT 25 18  ALKPHOS 52 40  BILITOT 1.9* 0.6  PROT 7.3 5.7*  ALBUMIN 3.7 2.9*   Recent Labs  Lab 05/19/19 1926 05/22/19 0357  LIPASE 64* 103*   No results for input(s): AMMONIA in the  last 168 hours. Coagulation Profile: Recent Labs  Lab 05/19/19 1926  INR 1.0   Cardiac Enzymes: No results for input(s): CKTOTAL, CKMB, CKMBINDEX, TROPONINI in the last 168 hours. BNP (last 3 results) Recent Labs    06/08/18 1158  PROBNP 98   HbA1C: Recent Labs    05/20/19 0500  HGBA1C 7.9*   CBG: Recent Labs  Lab 05/21/19 1111 05/21/19 1549 05/21/19 2126 05/22/19 0714 05/22/19 1142  GLUCAP 130* 97 112* 107* 116*   Lipid Profile: No results for input(s): CHOL, HDL, LDLCALC, TRIG, CHOLHDL, LDLDIRECT in the last 72 hours. Thyroid Function Tests: No results for input(s): TSH, T4TOTAL, FREET4, T3FREE, THYROIDAB in the last 72 hours. Anemia Panel: No results for input(s): VITAMINB12, FOLATE, FERRITIN, TIBC, IRON, RETICCTPCT in the last 72 hours. Urine analysis:    Component Value Date/Time   COLORURINE YELLOW 05/20/2019 0256   APPEARANCEUR HAZY (A) 05/20/2019 0256   LABSPEC 1.018 05/20/2019 0256   PHURINE 5.0 05/20/2019 0256    GLUCOSEU NEGATIVE 05/20/2019 0256   HGBUR SMALL (A) 05/20/2019 0256   BILIRUBINUR NEGATIVE 05/20/2019 0256   BILIRUBINUR negative 09/10/2018 1523   KETONESUR 5 (A) 05/20/2019 0256   PROTEINUR 30 (A) 05/20/2019 0256   UROBILINOGEN 0.2 09/10/2018 1523   UROBILINOGEN 1.0 03/10/2015 1459   NITRITE NEGATIVE 05/20/2019 0256   LEUKOCYTESUR MODERATE (A) 05/20/2019 0256   Sepsis Labs: @LABRCNTIP (procalcitonin:4,lacticidven:4)  ) Recent Results (from the past 240 hour(s))  Blood Culture (routine x 2)     Status: None (Preliminary result)   Collection Time: 05/19/19  8:20 PM   Specimen: BLOOD  Result Value Ref Range Status   Specimen Description BLOOD LEFT ANTECUBITAL  Final   Special Requests   Final    BOTTLES DRAWN AEROBIC AND ANAEROBIC Blood Culture results may not be optimal due to an inadequate volume of blood received in culture bottles   Culture   Final    NO GROWTH 3 DAYS Performed at [facility], [place] [address], [place], [place] [address]    Report Status PENDING  Incomplete  Blood Culture (routine x 2)     Status: None (Preliminary result)   Collection Time: 05/19/19  8:20 PM   Specimen: BLOOD LEFT FOREARM  Result Value Ref Range Status   Specimen Description BLOOD LEFT FOREARM  Final   Special Requests   Final    BOTTLES DRAWN AEROBIC AND ANAEROBIC Blood Culture results may not be optimal due to an inadequate volume of blood received in culture bottles   Culture   Final    NO GROWTH 3 DAYS Performed at [facility], [place] [address] [place], [place] [address]    Report Status PENDING  Incomplete  SARS CORONAVIRUS 2 (TAT 6-24 HRS) Nasopharyngeal Nasopharyngeal Swab     Status: None   Collection Time: 05/19/19 10:21 PM   Specimen: Nasopharyngeal Swab  Result Value Ref Range Status   SARS Coronavirus 2 NEGATIVE NEGATIVE Final    Comment: (NOTE) SARS-CoV-2 target nucleic acids are NOT DETECTED. The SARS-CoV-2 RNA is generally detectable in upper and  lower respiratory specimens during the acute phase of infection. Negative results do not preclude SARS-CoV-2 infection, do not rule out co-infections with other pathogens, and should not be used as the sole basis for treatment or other patient management decisions. Negative results must be combined with clinical observations, patient history, and epidemiological information. The expected result is Negative. Fact Sheet for Patients: SugarRoll.be Fact Sheet for Healthcare Providers: https://www.woods-mathews.com/ This test  is not yet approved or cleared by the [place] and  has been authorized for detection and/or diagnosis of SARS-CoV-2 by FDA under an Emergency Use Authorization (EUA). This EUA will remain  in effect (meaning this test can be used) for the duration of the COVID-19 declaration under Section 56 4(b)(1) of the Act, 21 U.S.C. section 360bbb-3(b)(1), unless the authorization is terminated or revoked sooner. Performed at [facility], [place] [address] [place], [place] [address]   Urine culture     Status: Abnormal   Collection Time: 05/20/19  2:28 AM   Specimen: In/Out Cath Urine  Result Value Ref Range Status   Specimen Description IN/OUT CATH URINE  Final   Special Requests NONE  Final   Culture (A)  Final    <10,000 COLONIES/mL INSIGNIFICANT GROWTH Performed at [facility], [place] [address], [place], [place] [address]    Report Status 05/21/2019 FINAL  Final      Studies: No results found.  Scheduled Meds: . heparin  5,000 Units Subcutaneous Q8H  . insulin aspart  0-9 Units Subcutaneous TID WC  . methylPREDNISolone  4 mg Oral Q breakfast  . mometasone-formoterol  2 puff Inhalation BID  . pantoprazole  40 mg Oral Daily  . pravastatin  20 mg Oral QPM  . simethicone  160 mg Oral Daily    Continuous Infusions: . lactated ringers 75 mL/hr at 05/22/19 1518     LOS: 3 days     Kayleen Memos,  MD [facility] Pager [phone]  If 7PM-7AM, please contact night-coverage [email or web address] Password [email or web address] 05/22/2019, 4:14 PM

## 2019-05-22 NOTE — Progress Notes (Signed)
Printed new armband and barcode still wont read.   Eleanora Neighbor, RN

## 2019-05-23 DIAGNOSIS — R748 Abnormal levels of other serum enzymes: Secondary | ICD-10-CM

## 2019-05-23 LAB — COMPREHENSIVE METABOLIC PANEL
ALT: 19 U/L (ref 0–44)
AST: 38 U/L (ref 15–41)
Albumin: 2.7 g/dL — ABNORMAL LOW (ref 3.5–5.0)
Alkaline Phosphatase: 33 U/L — ABNORMAL LOW (ref 38–126)
Anion gap: 10 (ref 5–15)
BUN: 9 mg/dL (ref 8–23)
CO2: 27 mmol/L (ref 22–32)
Calcium: 7.9 mg/dL — ABNORMAL LOW (ref 8.9–10.3)
Chloride: 102 mmol/L (ref 98–111)
Creatinine, Ser: 1.07 mg/dL — ABNORMAL HIGH (ref 0.44–1.00)
GFR calc Af Amer: 56 mL/min — ABNORMAL LOW (ref >60)
GFR calc non Af Amer: 49 mL/min — ABNORMAL LOW (ref >60)
Glucose, Bld: 107 mg/dL — ABNORMAL HIGH (ref 70–99)
Potassium: 3.7 mmol/L (ref 3.5–5.1)
Sodium: 139 mmol/L (ref 135–145)
Total Bilirubin: 0.5 mg/dL (ref 0.3–1.2)
Total Protein: 5.5 g/dL — ABNORMAL LOW (ref 6.5–8.1)

## 2019-05-23 LAB — LIPASE, BLOOD: Lipase: 111 U/L — ABNORMAL HIGH (ref 11–51)

## 2019-05-23 LAB — GLUCOSE, CAPILLARY
Glucose-Capillary: 96 mg/dL (ref 70–99)
Glucose-Capillary: 116 mg/dL — ABNORMAL HIGH (ref 70–99)
Glucose-Capillary: 130 mg/dL — ABNORMAL HIGH (ref 70–99)
Glucose-Capillary: 96 mg/dL (ref 70–99)

## 2019-05-23 MED ORDER — HYDROXYCHLOROQUINE SULFATE 200 MG PO TABS
200.0000 mg | ORAL_TABLET | Freq: Two times a day (BID) | ORAL | Status: DC
  Administered 2019-05-23 – 2019-05-26 (×7): 200 mg via ORAL
  Filled 2019-05-23 (×7): qty 1

## 2019-05-23 MED ORDER — SODIUM CHLORIDE 0.9 % IV SOLN
INTRAVENOUS | Status: DC
  Administered 2019-05-23 – 2019-05-25 (×6): via INTRAVENOUS

## 2019-05-23 MED ORDER — SIMETHICONE 80 MG PO CHEW: 160.0000 mg | CHEWABLE_TABLET | Freq: Every day | ORAL | Status: DC | PRN

## 2019-05-23 MED ORDER — TRAMADOL HCL 50 MG PO TABS
50.0000 mg | ORAL_TABLET | Freq: Four times a day (QID) | ORAL | Status: DC | PRN
  Administered 2019-05-23 – 2019-05-26 (×7): 50 mg via ORAL
  Filled 2019-05-23 (×7): qty 1

## 2019-05-23 NOTE — Progress Notes (Signed)
PROGRESS NOTE  [name] [id number] DOB: 06/03/1936 DOA: 05/19/2019 PCP: [name], PA-C  HPI/Recap of past 24 hours:  [name] is a 83 y.o. female with medical history significant of chronic diastolic heart failure, COPD, hypertension, type II diabetes, rheumatoid arthritis who presents with concerns of dizziness and weakness for the past 3 days.  She also noticed increased bowel movement.  Denies any diarrhea but reports that she is on MiraLAX and recently stopped this due to increased bowel movement.  Also has nausea and vomiting that started yesterday.  Has had decreased appetite. Notes epigastric abdominal pain.   Denies any fevers.  Denies any chest pain or shortness of breath.  No new cough or runny nose.   ED Course: She is afebrile and and hypotensive down to 80 over 40s but improved with 3 L of LR fluid resuscitation.  CBC showed no leukocytosis or anemia.  CMP showed mildly elevated potassium of 5.2.  Creatinine significantly elevated at 4.93 and BUN of 29.  Prior creatinine 2 months ago was at 1.45.  Mildly elevated total bilirubin of 1.9.  Anion gap of 18.  Lactate of 3.9. COVID test pending.  CT head was obtained by ED PA since patient endorsed occasional right upper extremity weakness but had no acute findings.  CT abdomen pelvis showed no evidence of any acute diverticulitis or urethral obstructions.  Hospital course complicated intermittent abdominal pain worse in epigastric region.  Lipase trending up from 64 on 05/19/19 to 103 on 05/22/2019.  Last endoscopy and colonoscopy done at [place] were unrevealing.  Completed 3 days of IV antibiotics cefepime and IV Flagyl empirically.  Afebrile with no leukocytosis.  05/23/19:  Patient continues to have epigastric pain. Also complains of generalized musculoskeletal pain throughout.  She does have a history of rheumatoid arthritis and is on Plaquenil outside of the hospital.  This had been held  initially.  Assessment/Plan: Active Problems:   Rheumatoid arthritis ([place])   Sepsis ([place])  Sepsis secondary to unclear etiology, possible intra-abdominal process Presented with lactic acidosis, tachycardia, positive urine analysis. CT abdomen pelvis unrevealing Urine analysis with pyuria and negative urine culture Blood cultures x2 peripherally negative to date.   Completed 3 days of IV cefepime empirically, started on 05/19/2019, ending 05/22/2019.  Elevated lipase No sign of acute pancreatitis on CT abdomen and pelvis done on admission Denies history of alcohol use. Last LDL 243 on 08/13/18. Lipase levels have continued to increase and is 111 on 10/25 Increase IV fluids to 125 mL's per hour (normal saline) Repeat lipase level and BMP in the morning Change diet to clear liquid If continue to be elevated, may need to consult GI   Improving AKI on CKD 3, likely secondary to poor renal perfusion Appears to be at his baseline creatinine 1.3 with GFR of 36 Renal function continues to improve Presented with creatinine of 4.91 with GFR of 9 Continue to avoid nephrotoxins Continue to monitor urine output Maintain map greater than 65 to avoid renal hypoprofusion Continue to monitor electrolytes and daily BMPs.  Rheumatoid arthritis Resume p.o. Medrol 4 mg daily and tramadol as needed Resume Plaquenil  Generalized weakness/physical debility PT OT to assess OT recommended home health OT with 24 supervision and assistance. Continue fall precaution Out of bed to chair every shift with assistance and fall precautions.  Resolved high anion gap metabolic acidosis Presented with chemistry bicarb of 15, anion gap of 18 and AKI Completed isotonic bicarb at 75 cc/h Chemistry bicarb 26  with anion gap of 13 on 05/21/2019 Chemistry bicarb 24 and anion gap of 12 on 05/22/2019 Continue to monitor electrolytes Continue daily BMPs  Resolved post repletion: Hypokalemia Potassium 3.0>> 3.9 on  05/22/2019. Repleted with p.o. KCl 40 mEq x 2.  Pyuria Urine culture negative History of Klebsiella pneumonia and E. coli UTI in 2018  Resolving intractable nausea frequent stool Continue IV antiemetics as needed Recently MiraLAX  Chronic diastolic CHF Appears euvolemic on exam Last 2D echo done on 09/07/2018 showed LVEF 60-65% Continue strict I's and O's and daily weight Continue to monitor volume status  Type 2 diabetes Well controlled Last A1c 8.2 on August 13 th 2020 Continue sensitive insulin sliding scale Avoid hypoglycemia in the setting of renal insufficiency and insulin use. Continue CBGs    Code Status: Full code  Family Communication: None at bedside  Disposition Plan: Patient is currently not appropriate for discharge at this time due to persistent symptomatology with ongoing work-up.     Consultants:  None  Procedures:  None  Antimicrobials:  None.  DVT prophylaxis: Subcu heparin 3 times daily.   Objective: Vitals:   05/22/19 2058 05/23/19 0428 05/23/19 0720 05/23/19 0844  BP:  (!) 108/53  111/66  Pulse:  (!) 101  86  Resp:  16  18  Temp:  98.9 F (37.2 C)  98.2 F (36.8 C)  TempSrc:  Oral  Oral  SpO2: 92% 92% 91% 93%  Weight:  62.9 kg    Height:        Intake/Output Summary (Last 24 hours) at 05/23/2019 1257 Last data filed at 05/23/2019 0845 Gross per 24 hour  Intake 1262.27 ml  Output 0 ml  Net 1262.27 ml   Filed Weights   05/19/19 2028 05/23/19 0428  Weight: 61.2 kg 62.9 kg    Exam:  . General: 83 y.o. year-old female well-developed well-nourished in no acute distress.  Alert and oriented x3. . Cardiovascular: Regular rate with no murmurs, rubs, gallops. Marland Kitchen Respiratory: Clear to auscultation bilaterally with no wheezes, rales, rhonchi . Abdomen: Mild to moderate epigastric tenderness.  No left upper quadrant tenderness.  No rebound or guarding present. . Musculoskeletal: No lower extremity edema.  Peripheral pulses  intact . Psychiatry: Mood is appropriate for condition and setting.   Data Reviewed: CBC: Recent Labs  Lab 05/19/19 1926 05/20/19 0500  WBC 10.2 9.8  NEUTROABS 6.5  --   HGB 14.6 12.0  HCT 44.9 37.2  MCV 92.0 93.7  PLT 375 [id number]   Basic Metabolic Panel: Recent Labs  Lab 05/19/19 1926 05/20/19 0248 05/20/19 0253 05/21/19 0552 05/22/19 0357 05/23/19 0537  NA 136 140 140 139 138 139  K 5.2* 4.2  --  3.0* 3.9 3.7  CL 103 112*  --  100 102 102  CO2 15* 14*  --  26 24 27   GLUCOSE 250* 110*  --  121* 110* 107*  BUN 29* 31*  --  30* 24* 9  CREATININE 4.93* 3.59* 3.61* 1.83* 1.38* 1.07*  CALCIUM 9.7 8.3*  --  8.2* 7.9* 7.9*   GFR: Estimated Creatinine Clearance: 35.9 mL/min (A) (by C-G formula based on SCr of 1.07 mg/dL (H)). Liver Function Tests: Recent Labs  Lab 05/19/19 1926 05/20/19 0248 05/23/19 0537  AST 44* 27 38  ALT 25 18 19   ALKPHOS 52 40 33*  BILITOT 1.9* 0.6 0.5  PROT 7.3 5.7* 5.5*  ALBUMIN 3.7 2.9* 2.7*   Recent Labs  Lab 05/19/19 1926 05/22/19 0357 05/23/19 0537  LIPASE 64* 103* 111*   No results for input(s): AMMONIA in the last 168 hours. Coagulation Profile: Recent Labs  Lab 05/19/19 1926  INR 1.0   Cardiac Enzymes: No results for input(s): CKTOTAL, CKMB, CKMBINDEX, TROPONINI in the last 168 hours. BNP (last 3 results) Recent Labs    06/08/18 1158  PROBNP 98   HbA1C: No results for input(s): HGBA1C in the last 72 hours. CBG: Recent Labs  Lab 05/22/19 1142 05/22/19 1619 05/22/19 2025 05/23/19 0657 05/23/19 1131  GLUCAP 116* 117* 113* 96 116*   Lipid Profile: No results for input(s): CHOL, HDL, LDLCALC, TRIG, CHOLHDL, LDLDIRECT in the last 72 hours. Thyroid Function Tests: No results for input(s): TSH, T4TOTAL, FREET4, T3FREE, THYROIDAB in the last 72 hours. Anemia Panel: No results for input(s): VITAMINB12, FOLATE, FERRITIN, TIBC, IRON, RETICCTPCT in the last 72 hours. Urine analysis:    Component Value Date/Time    COLORURINE YELLOW 05/20/2019 0256   APPEARANCEUR HAZY (A) 05/20/2019 0256   LABSPEC 1.018 05/20/2019 0256   PHURINE 5.0 05/20/2019 0256   GLUCOSEU NEGATIVE 05/20/2019 0256   HGBUR SMALL (A) 05/20/2019 0256   BILIRUBINUR NEGATIVE 05/20/2019 0256   BILIRUBINUR negative 09/10/2018 1523   KETONESUR 5 (A) 05/20/2019 0256   PROTEINUR 30 (A) 05/20/2019 0256   UROBILINOGEN 0.2 09/10/2018 1523   UROBILINOGEN 1.0 03/10/2015 1459   NITRITE NEGATIVE 05/20/2019 0256   LEUKOCYTESUR MODERATE (A) 05/20/2019 0256   Sepsis Labs: @LABRCNTIP (procalcitonin:4,lacticidven:4)  ) Recent Results (from the past 240 hour(s))  Blood Culture (routine x 2)     Status: None (Preliminary result)   Collection Time: 05/19/19  8:20 PM   Specimen: BLOOD  Result Value Ref Range Status   Specimen Description BLOOD LEFT ANTECUBITAL  Final   Special Requests   Final    BOTTLES DRAWN AEROBIC AND ANAEROBIC Blood Culture results may not be optimal due to an inadequate volume of blood received in culture bottles   Culture   Final    NO GROWTH 3 DAYS Performed at [facility], [place] [address], [place], [place] [address]    Report Status PENDING  Incomplete  Blood Culture (routine x 2)     Status: None (Preliminary result)   Collection Time: 05/19/19  8:20 PM   Specimen: BLOOD LEFT FOREARM  Result Value Ref Range Status   Specimen Description BLOOD LEFT FOREARM  Final   Special Requests   Final    BOTTLES DRAWN AEROBIC AND ANAEROBIC Blood Culture results may not be optimal due to an inadequate volume of blood received in culture bottles   Culture   Final    NO GROWTH 3 DAYS Performed at [facility], [place] [address] [place], [place] [address]    Report Status PENDING  Incomplete  SARS CORONAVIRUS 2 (TAT 6-24 HRS) Nasopharyngeal Nasopharyngeal Swab     Status: None   Collection Time: 05/19/19 10:21 PM   Specimen: Nasopharyngeal Swab  Result Value Ref Range Status   SARS Coronavirus 2 NEGATIVE NEGATIVE  Final    Comment: (NOTE) SARS-CoV-2 target nucleic acids are NOT DETECTED. The SARS-CoV-2 RNA is generally detectable in upper and lower respiratory specimens during the acute phase of infection. Negative results do not preclude SARS-CoV-2 infection, do not rule out co-infections with other pathogens, and should not be used as the sole basis for treatment or other patient management decisions. Negative results must be combined with clinical observations, patient history, and epidemiological information. The expected result is Negative. Fact Sheet  for Patients: SugarRoll.be Fact Sheet for Healthcare Providers: https://www.woods-mathews.com/ This test is not yet approved or cleared by the [place] FDA and  has been authorized for detection and/or diagnosis of SARS-CoV-2 by FDA under an Emergency Use Authorization (EUA). This EUA will remain  in effect (meaning this test can be used) for the duration of the COVID-19 declaration under Section 56 4(b)(1) of the Act, 21 U.S.C. section 360bbb-3(b)(1), unless the authorization is terminated or revoked sooner. Performed at [facility], [place] [address] [place], [place] [address]   Urine culture     Status: Abnormal   Collection Time: 05/20/19  2:28 AM   Specimen: In/Out Cath Urine  Result Value Ref Range Status   Specimen Description IN/OUT CATH URINE  Final   Special Requests NONE  Final   Culture (A)  Final    <10,000 COLONIES/mL INSIGNIFICANT GROWTH Performed at [facility], [place] [address] [place], [place] [address]    Report Status 05/21/2019 FINAL  Final      Studies: No results found.  Scheduled Meds: . heparin  5,000 Units Subcutaneous Q8H  . hydroxychloroquine  200 mg Oral BID  . insulin aspart  0-9 Units Subcutaneous TID WC  . methylPREDNISolone  4 mg Oral Q breakfast  . mometasone-formoterol  2 puff Inhalation BID  . pantoprazole  40 mg Oral Daily  .  pravastatin  20 mg Oral QPM    Continuous Infusions: . sodium chloride 125 mL/hr at 05/23/19 1044     LOS: 4 days     Truett Mainland, MD [facility] Pager [phone]  If 7PM-7AM, please contact night-coverage [email or web address] Password [facility] 05/23/2019, 12:57 PM

## 2019-05-24 DIAGNOSIS — R140 Abdominal distension (gaseous): Secondary | ICD-10-CM

## 2019-05-24 DIAGNOSIS — M05742 Rheumatoid arthritis with rheumatoid factor of left hand without organ or systems involvement: Secondary | ICD-10-CM

## 2019-05-24 DIAGNOSIS — R109 Unspecified abdominal pain: Secondary | ICD-10-CM

## 2019-05-24 DIAGNOSIS — M05741 Rheumatoid arthritis with rheumatoid factor of right hand without organ or systems involvement: Secondary | ICD-10-CM

## 2019-05-24 LAB — BASIC METABOLIC PANEL
Anion gap: 10 (ref 5–15)
BUN: <5 mg/dL — ABNORMAL LOW (ref 8–23)
CO2: 24 mmol/L (ref 22–32)
Calcium: 7.5 mg/dL — ABNORMAL LOW (ref 8.9–10.3)
Chloride: 107 mmol/L (ref 98–111)
Creatinine, Ser: 0.82 mg/dL (ref 0.44–1.00)
GFR calc Af Amer: >60 mL/min (ref >60)
GFR calc non Af Amer: >60 mL/min (ref >60)
Glucose, Bld: 82 mg/dL (ref 70–99)
Potassium: 3.4 mmol/L — ABNORMAL LOW (ref 3.5–5.1)
Sodium: 141 mmol/L (ref 135–145)

## 2019-05-24 LAB — GLUCOSE, CAPILLARY
Glucose-Capillary: 71 mg/dL (ref 70–99)
Glucose-Capillary: 89 mg/dL (ref 70–99)
Glucose-Capillary: 110 mg/dL — ABNORMAL HIGH (ref 70–99)
Glucose-Capillary: 70 mg/dL (ref 70–99)

## 2019-05-24 LAB — CULTURE, BLOOD (ROUTINE X 2)
Culture: NO GROWTH
Culture: NO GROWTH

## 2019-05-24 LAB — LIPASE, BLOOD: Lipase: 89 U/L — ABNORMAL HIGH (ref 11–51)

## 2019-05-24 MED ORDER — POTASSIUM CHLORIDE CRYS ER 20 MEQ PO TBCR
40.0000 meq | EXTENDED_RELEASE_TABLET | Freq: Once | ORAL | Status: AC
  Administered 2019-05-24: 40 meq via ORAL
  Filled 2019-05-24: qty 2

## 2019-05-24 NOTE — Consult Note (Signed)
[facility] Consult: 1:56 PM 05/24/2019  LOS: 5 days    Referring Provider: Dr Irene Pap  Primary Care Physician:  [name], PA-C Primary Gastroenterologist:  Dr. Havery Moros     Reason for Consultation:  Abdominal bloating, pt requested GI eval and Dr Nevada Crane complied.     HPI: [name] is a 83 y.o. female.  Hx of OSA, not on CPAP.  Obesity.  Rheumatoid arthritis.  Hypertension.  Diastolic CHF.  GERD.  Type 2 diabetes. UTIs.  Prior surgeries include cholecystectomy, appendectomy, hysterectomy. Takes low-dose, 4 mg, Medrol daily for her arthritis  2006 colonoscopy by Dr. Vladimir Faster.  2, tubular adenomatous, polyps removed. 08/2017 colonoscopy.  Dr Havery Moros. For weight loss and surveillance of adenomatous polyps.  Flat, 10 mm cecal polyp.  three, 4 to 6 mm, sessile polyps in transverse colon.  Transverse and sigmoid diverticulosis.  Internal hemorrhoids.  Pathology on the polyps were adenomatous, no H GD. 08/2017 EGD.  Subepithelial gastric nodule/mass not bleeding and no stigmata of recent bleeding in the gastric fundus.  Biopsied (fundic gland polyp).  Small subepithelial nodules in the gastric body, biopsied (gastric, antral and oxyntic mucosa with no specific histopathologic changes.  No H. Pylori).   09/2017 Upper EUS.  For evaluation of gastric nodule:  2.3 cm, subepithelial lesion in proximal stomach correlates with densely calcified lesion that appears to involve the wall of the stomach.  No evident associated soft tissue involvement.  Otherwise normal gastric wall.  Limited views of liver, pancreas, spleen all normal.  The subepithelial lesion correlates with a heavily calcified lesion in the proximal gastric wall that has been present by imaging since at least 2007 and has not changed  significantly.  Dr. Ardis Hughs did not feel the lesions were causing her symptoms and would not require any surveillance.   For at least 52months she has been having problems with bloating associated with constipation, epigastric discomfort.  Did not tolerated milk of magnesia in the past, it caused nausea, vomiting.  She was started on daily MiraLAX 3 months ago which allowed her to have regular bowel movements and would relieve the bloating.  However bloating/epigastric discomfort eventually recurr.  Simethicone has not helped.  She is compliant with omeprazole 40 mg and 81 mg ASA daily.  She also takes ibuprofen 800 mg up to twice daily, 3 or 4 times a week.  Takes up to 3 BC powders daily, 3-4 times a week.  Stools are brown, no blood.  No dysphagia.  In recent weeks has not been eating quite as well but no nausea or vomiting. Looking back at her initial [facility] office visit in February 2019, she was complaining of similar epigastric, subxiphoid discomfort but was having regurgitation at that time Presented with 3 days weakness, dizziness and increased BMs.  Nonbloody, noncoffee-ground nausea, vomiting began 24 hours PTA.  These symptoms have all but resolved over the course of her admission but she is now complaining of the chronic bloating.  Labs show mild elevation of lipase to max of 111, 89 today.  LFTs normal. AKI, resolved.  Hypokalemia. No anemia or elevated WBCs. COVID-19 negative CTAP wo contrast: Right lung atelectasis.  Gallbladder surgically absent.  Bile ducts, pancreatic duct normal.  Pancreas, spleen, liver normal.  Stomach, SB, colon Normal other than fluid seen in the rectum. S/p hysterectomy.      Past Medical History:  Diagnosis Date  . Acute diastolic heart failure ([place]) 06/15/2018  . Acute kidney injury ([place]) 01/22/2015  . Acute renal failure ([place])   . Acute respiratory failure with hypoxia ([place]) 06/14/2015  . Allergy   . Anemia    "when I was young"  . Anginal pain  ([place])   . Anxiety   . Arthritis    "all over my body"   . Chest pain 04/07/2014  . Chronic bronchitis ([place])    "get it just about q yr" (06/14/2015)  . Chronic diastolic CHF (congestive heart failure) ([place])   . Chronic diastolic heart failure, NYHA class 1 ([place]) 01/22/2015  . Chronic lower back pain   . Chronic steroid use 12/11/2012  . Constipation 01/09/2017  . COPD (chronic obstructive pulmonary disease) ([place]) 01/09/2017  . Dehydration, moderate 12/11/2012  . 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 DM (diabetes mellitus) ([place]) 04/08/2014  . Dyspnea 08/10/2018  . Dysrhythmia    patient unsure what it is but has been told that.  . E-coli UTI 01/09/2017  . 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 Elevated lactic acid level 01/22/2015  . Essential hypertension 06/15/2018  . Exertional shortness of breath   . Frequent UTI    "recently" (06/14/2015)  . Gastric mass   . GERD (gastroesophageal reflux disease)   . Headache(784.0)    "weekly; wake up w/them " (110/19/2018)  . Heart murmur   . High cholesterol   . History of blood transfusion ~ 1954   "related to my periods"  . Hyperglycemia 12/11/2012  . Hypertension   . Intractable nausea and vomiting 05/15/2017  . Migraine    "once/month maybe" (05/16/2017)  . Multiple falls 06/14/2015  . Noncompliance 12/11/2012  . Orthostasis 06/14/2015  . OSA (obstructive sleep apnea)    "I think I'm using a BiPAP" (06/14/2015)  . Sepsis ([place]) 01/09/2017  . Sepsis secondary to UTI ([place])   . Syncope and collapse 12/11/2012  . Tachycardia with heart rate 100-120 beats per minute 04/08/2014  . Type II diabetes mellitus ([place])   . Uncontrolled type 2 diabetes mellitus with complication ([place])   . UTI (lower urinary tract infection) 12/11/2012  . Weight loss 06/14/2015    Past Surgical History:  Procedure Laterality Date  . APPENDECTOMY  1970   Archie Endo 12/12/2010  . BACK SURGERY    . [place]   Archie Endo 12/12/2010  . EUS N/A 10/16/2017    Procedure: UPPER ENDOSCOPIC ULTRASOUND (EUS) RADIAL;  Surgeon: Milus Banister, MD;  Location: [facility];  Service: Endoscopy;  Laterality: N/A;  . FRACTURE SURGERY    . LAPAROSCOPIC CHOLECYSTECTOMY  1990's  . LUMBAR MICRODISCECTOMY Left 09/2006   L5-S1/notes 12/12/2010  . MULTIPLE EXTRACTIONS WITH ALVEOLOPLASTY Bilateral 06/13/2017   Procedure: MULTIPLE EXTRACTION WITH ALVEOLOPLASTY;  Surgeon: Diona Browner, DDS;  Location: [facility];  Service: Oral Surgery;  Laterality: Bilateral;  . MULTIPLE TOOTH EXTRACTIONS    . ORIF SHOULDER FRACTURE Left ~ 2009   "put cement down in it"   . TONSILLECTOMY  1950's?  . TOTAL ABDOMINAL HYSTERECTOMY  1979   Archie Endo 12/12/2010    Prior to Admission medications   Medication Sig  Start Date End Date Taking? Authorizing Provider  aspirin EC 81 MG tablet Take 1 tablet (81 mg total) by mouth daily. Patient taking differently: Take 81 mg by mouth 3 (three) times a week.  04/09/14  Yes Debbe Odea, MD  atorvastatin (LIPITOR) 10 MG tablet One qd x 1 month, then 2 qd if tolerated Patient taking differently: Take 10-20 mg by mouth See admin instructions. Take 10 mg by mouth once a day for 1 month, then increase to 20 mg once a day (if tolerated) 04/01/19  Yes Rodriguez-Southworth, Sunday Spillers, PA-C  dicyclomine (BENTYL) 20 MG tablet Take 20 mg by mouth 2 (two) times daily. 10/13/17  Yes [provider]  hydroxychloroquine (PLAQUENIL) 200 MG tablet Take 200 mg by mouth 2 (two) times daily.   Yes [provider]  hydrOXYzine (ATARAX/VISTARIL) 10 MG tablet 1 q 8h prn itching Patient taking differently: Take 10 mg by mouth every 8 (eight) hours as needed for itching.  04/22/19  Yes Rodriguez-Southworth, Sunday Spillers, PA-C  ibuprofen (ADVIL,MOTRIN) 800 MG tablet Take 800 mg by mouth daily as needed for pain. 06/27/18  Yes [provider]  lisinopril-hydrochlorothiazide (ZESTORETIC) 10-12.5 MG tablet TAKE 1 TABLET BY MOUTH EVERY DAY Patient taking differently:  Take 1 tablet by mouth daily.  04/12/19  Yes Rodriguez-Southworth, Sunday Spillers, PA-C  methylPREDNISolone (MEDROL) 4 MG tablet Take 4 mg by mouth daily.    Yes [provider]  omeprazole (PRILOSEC) 20 MG capsule Take 20 mg by mouth daily before breakfast.  12/17/16  Yes [provider]  simethicone (GAS-X EXTRA STRENGTH) 125 MG chewable tablet Chew 125 mg by mouth every 6 (six) hours as needed for flatulence.   Yes [provider]  traMADol (ULTRAM) 50 MG tablet Take 1 tablet (50 mg total) by mouth every 12 (twelve) hours as needed for moderate pain. 05/07/19  Yes Rodriguez-Southworth, Sunday Spillers, PA-C  albuterol (ACCUNEB) 0.63 MG/3ML nebulizer solution Take 2 ampules by nebulization every 6 (six) hours as needed for wheezing.    [provider]  budesonide-formoterol (SYMBICORT) 160-4.5 MCG/ACT inhaler Inhale 2 puffs into the lungs 2 (two) times daily. Patient not taking: Reported on 05/21/2019 09/10/18   Rodriguez-Southworth, Sunday Spillers, PA-C  glucose blood (ONETOUCH VERIO) test strip Use to check blood sugar 3 to 4 times a day before meals 03/25/19   Rodriguez-Southworth, Sunday Spillers, PA-C  Semaglutide (RYBELSUS) 7 MG TABS Take 1 tablet by mouth daily for 30 days, THEN 1 tablet daily. X 1 month, then 2 qd. 05/06/19 07/05/19  Rodriguez-Southworth, Sunday Spillers, PA-C    Scheduled Meds: . heparin  5,000 Units Subcutaneous Q8H  . hydroxychloroquine  200 mg Oral BID  . insulin aspart  0-9 Units Subcutaneous TID WC  . methylPREDNISolone  4 mg Oral Q breakfast  . mometasone-formoterol  2 puff Inhalation BID  . pantoprazole  40 mg Oral Daily  . pravastatin  20 mg Oral QPM   Infusions: . sodium chloride 125 mL/hr at 05/24/19 1303   PRN Meds: acetaminophen, albuterol, alum & mag hydroxide-simeth, hydrOXYzine, ondansetron (ZOFRAN) IV, simethicone, traMADol   Allergies as of 05/19/2019  . (No Known Allergies)    Family History  Problem Relation Age of Onset  . Hypertension Mother   .  Heart attack Father   . Diabetes Mellitus II Sister   . Hypertension Sister   . Cancer - Other Sister   . Migraines Niece   . Colon cancer Neg Hx     Social History   Socioeconomic History  . Marital status:  Widowed    Spouse name: Not on file  . Number of children: Not on file  . Years of education: Not on file  . Highest education level: Not on file  Occupational History  . Occupation: retired  Scientific laboratory technician  . Financial resource strain: Not hard at all  . Food insecurity    Worry: Never true    Inability: Never true  . Transportation needs    Medical: No    Non-medical: No  Tobacco Use  . Smoking status: Former Smoker    Types: Cigarettes  . Smokeless tobacco: Never Used  . Tobacco comment: 05/16/2017"1 pack of cigarettes would last 3-4 months; haven't had any cigarettes for 40 years"  Substance and Sexual Activity  . Alcohol use: Not Currently    Frequency: Never    Comment: 05/16/2017 "used to drink beer in the 1990's; don't drink at all now"  . Drug use: No  . Sexual activity: Not Currently    Birth control/protection: Abstinence  Lifestyle  . Physical activity    Days per week: 0 days    Minutes per session: 0 min  . Stress: Not at all  Relationships  . Social Herbalist on phone: Not on file    Gets together: Not on file    Attends religious service: Not on file    Active member of club or organization: Not on file    Attends meetings of clubs or organizations: Not on file    Relationship status: Not on file  . Intimate partner violence    Fear of current or ex partner: No    Emotionally abused: No    Physically abused: No    Forced sexual activity: No  Other Topics Concern  . Not on file  Social History Narrative   Lives at home alone    Her niece comes to check on her everyday. Her son comes by every other day as well   Right handed   Caffeine: none    REVIEW OF SYSTEMS: Constitutional: She walks to her garbage bins daily.  She  sleeps off her front and back porch daily ENT:  No nose bleeds.  Edentulous, her dentures do not fit well. Pulm: Recent shortness of breath, resolved.  No cough. CV:  No palpitations.  No angina.  Has never had swelling in her ankles or feet until it occurred yesterday but it is resolved today. GU:  No hematuria, no frequency GI: See HPI.  Patient avoids solid, difficult to chew foods such as beef because of her difficulty chewing them. Heme: Denies excessive or unusual bleeding or bruising. Transfusions: Does not recall previous transfusion with RBCs or other blood products. Neuro:  No headaches, no peripheral tingling or numbness.  No syncope, no seizures. Derm:  No itching, no rash or sores.  Endocrine:  No sweats or chills.  No polyuria or dysuria Immunization: Reviewed.  She received flu vaccination in early 03/2019. Travel:  None beyond local counties in last few months.    PHYSICAL EXAM: Vital signs in last 24 hours: Vitals:   05/24/19 0737 05/24/19 0935  BP:  136/77  Pulse: 90 95  Resp: 18 20  Temp:  97.7 F (36.5 C)  SpO2: 96% 92%   Wt Readings from Last 3 Encounters:  05/23/19 62.9 kg  04/29/19 63.4 kg  04/22/19 63.7 kg    General: Pleasant, somewhat frail, elderly AAF.  Sitting on bedside commode, comfortable.  Able to transfer back to bed  with minimal assistance. Head: No facial asymmetry or swelling.  No signs of head trauma. Eyes: No scleral icterus or conjunctival pallor. Ears: Not HOH Nose: No congestion or discharge. Mouth: Tongue midline.  Edentulous.  Oral mucosa moist, pink, clear. Neck: No JVD, no masses, no thyromegaly Lungs: Clear bilaterally.  No labored breathing, no cough. Heart: RRR.  No MRG.  S1, S2 present. Abdomen: Soft, nondistended, nontender.  Active bowel sounds.  No HSM, masses, bruits, hernias..   Rectal: Deferred Musc/Skeltl: Deformities in the fingers consistent with RA.  No gross deformity of knees. Extremities: No CCE. Neurologic:  Oriented x3.  Good historian.  Moves all 4 limbs without tremor.  Strength not tested. Skin: No rash, no sores, no telangiectasia. Nodes: No cervical or inguinal adenopathy. Psych: Calm, pleasant, cooperative.  Does not seem depressed or anxious.  Intake/Output from previous day: 10/25 0701 - 10/26 0700 In: 2668.3 [P.O.:360; I.V.:2308.3] Out: 0  Intake/Output this shift: No intake/output data recorded.  LAB RESULTS: No results for input(s): WBC, HGB, HCT, PLT in the last 72 hours. BMET Lab Results  Component Value Date   NA 141 05/24/2019   NA 139 05/23/2019   NA 138 05/22/2019   K 3.4 (L) 05/24/2019   K 3.7 05/23/2019   K 3.9 05/22/2019   CL 107 05/24/2019   CL 102 05/23/2019   CL 102 05/22/2019   CO2 24 05/24/2019   CO2 27 05/23/2019   CO2 24 05/22/2019   GLUCOSE 82 05/24/2019   GLUCOSE 107 (H) 05/23/2019   GLUCOSE 110 (H) 05/22/2019   BUN <5 (L) 05/24/2019   BUN 9 05/23/2019   BUN 24 (H) 05/22/2019   CREATININE 0.82 05/24/2019   CREATININE 1.07 (H) 05/23/2019   CREATININE 1.38 (H) 05/22/2019   CALCIUM 7.5 (L) 05/24/2019   CALCIUM 7.9 (L) 05/23/2019   CALCIUM 7.9 (L) 05/22/2019   LFT Recent Labs    05/23/19 0537  PROT 5.5*  ALBUMIN 2.7*  AST 38  ALT 19  ALKPHOS 33*  BILITOT 0.5   PT/INR Lab Results  Component Value Date   INR 1.0 05/19/2019   INR 1.13 01/22/2015   INR 0.9 11/09/2007   Lipase     Component Value Date/Time   LIPASE 89 (H) 05/24/2019 0527     RADIOLOGY STUDIES: No results found.    IMPRESSION:   *     Chronic abdominal bloating. Resolved acute nausea, vomiting, diarrhea. Suspect IBS.  Symptoms somewhat resolved for up to a day after MiraLAX induced bowel movement.  *     Minor elevation of lipase.  No suspicion for acute pancreatitis.  Surgically absent gallbladder.  Normal biliary tree per CT imaging.  *    Heavy use of prescription strength ibuprofen and BC powders. Takes full-strength omeprazole daily. Benign/stable  fundic gland polyp, benign subepithelial nodules on EGD and upper EUS in 2019.  *     AKI, resolved.  Needs to avoid ibuprofen in future.   *     Hypokalemia.  Overall improved but not resolved.  Previously normal or above normal potassium    PLAN:     *     Per Dr. Rush Landmark.   Azucena Freed  05/24/2019, 1:56 PM Phone [phone]

## 2019-05-24 NOTE — Progress Notes (Signed)
PT id by name, DOB and MRN

## 2019-05-24 NOTE — Progress Notes (Signed)
PROGRESS NOTE  Lakima Tkac [id number] DOB: [birth year] DOA: 05/19/2019 PCP: [name], PA-C  HPI/Recap of past 24 hours:  [name] is a 83 y.o. female with medical history significant of chronic diastolic heart failure, COPD, hypertension, type II diabetes, rheumatoid arthritis who presents with concerns of dizziness and weakness for the past 3 days.  She also noticed increased bowel movement.  Denies any diarrhea but reports that she is on MiraLAX and recently stopped this due to increased bowel movement.  Also has nausea and vomiting that started yesterday.  Has had decreased appetite. Notes epigastric abdominal pain.   Denies any fevers.  Denies any chest pain or shortness of breath.  No new cough or runny nose.   ED Course: She is afebrile and and hypotensive down to 80 over 40s but improved with 3 L of LR fluid resuscitation.  CBC showed no leukocytosis or anemia.  CMP showed mildly elevated potassium of 5.2.  Creatinine significantly elevated at 4.93 and BUN of 29.  Prior creatinine 2 months ago was at 1.45.  Mildly elevated total bilirubin of 1.9.  Anion gap of 18.  Lactate of 3.9. COVID test pending.  CT head was obtained by ED PA since patient endorsed occasional right upper extremity weakness but had no acute findings.  CT abdomen pelvis showed no evidence of any acute diverticulitis or urethral obstructions.  Hospital course complicated intermittent abdominal pain worse in epigastric region.  Lipase trending up from 64 on 05/19/19 to 103 on 05/22/2019.  Last endoscopy and colonoscopy done at [facility] were unrevealing.  Completed 3 days of IV antibiotics cefepime and IV Flagyl empirically.  Afebrile with no leukocytosis.  05/24/19: Patient was seen and examined at her bedside this morning.  She reports intermittent epigastric pain, worse after eating a clear liquid diet.  Nausea has improved.  Lipase level improving. Patient requests GI consult inpatient. GI  Velora Heckler will see in consultation.    Assessment/Plan: Active Problems:   Rheumatoid arthritis ([place])   Sepsis ([place])  SIRS unclear etiology, possible intra-abdominal process Presented with lactic acidosis, tachycardia, positive urine analysis. CT abdomen pelvis unrevealing Urine analysis with pyuria and negative urine culture Blood cultures x2 peripherally negative to date.   Completed 3 days of IV cefepime empirically, started on 05/19/2019, ending 05/22/2019. Last endoscopy and colonoscopy done at [facility] were unrevealing.    Elevated lipase, trending down No sign of acute pancreatitis on CT abdomen and pelvis done on admission Lipase levels 103 on 05/22/2019>> 111>> 89 on 05/24/19 Denies history of alcohol use. Last LDL 243 on 08/13/18. Currently on NS at 125cc/hr Intermittent epigastric pain, worse after eating Requests GI consult, GI [place] will see  Improving AKI on CKD 3, likely secondary to poor renal perfusion Appears to be at his baseline creatinine 1.3 with GFR of 36 Renal function continues to improve Presented with creatinine of 4.91 with GFR of 9 Continue to avoid nephrotoxins Continue to monitor urine output Maintain map greater than 65 to avoid renal hypoprofusion Continue to monitor electrolytes and daily BMPs.  Rheumatoid arthritis C/w p.o. Medrol 4 mg daily and tramadol as needed C/w plaquenil  Generalized weakness/physical debility PT OT to assess OT recommended home health OT with 24 supervision and assistance. Continue fall precaution Out of bed to chair every shift with assistance and fall precautions.  Resolved high anion gap metabolic acidosis Presented with chemistry bicarb of 15, anion gap of 18 and AKI Completed isotonic bicarb at 75 cc/h Chemistry bicarb  26 with anion gap of 13 on 05/21/2019 Chemistry bicarb 24 and anion gap of 12 on 05/22/2019 Continue to monitor electrolytes Continue daily BMPs  Resolved post repletion:  Hypokalemia Potassium 3.0>> 3.9 on 05/22/2019. Repleted with p.o. KCl 40 mEq x 2.  Pyuria Urine culture negative History of Klebsiella pneumonia and E. coli UTI in 2018  Resolving intractable nausea frequent stool Continue IV antiemetics as needed Recently MiraLAX  Chronic diastolic CHF Appears euvolemic on exam Last 2D echo done on 09/07/2018 showed LVEF 60-65% Continue strict I's and O's and daily weight Continue to monitor volume status  Type 2 diabetes Well controlled Last A1c 8.2 on August 13 th 2020 Continue sensitive insulin sliding scale Avoid hypoglycemia in the setting of renal insufficiency and insulin use. Continue CBGs    Code Status: Full code  Family Communication: None at bedside  Disposition Plan: Patient is currently not appropriate for discharge at this time due to persistent symptomatology with ongoing work-up.     Consultants:  None  Procedures:  None  Antimicrobials:  None.  DVT prophylaxis: Subcu heparin 3 times daily.   Objective: Vitals:   05/23/19 2124 05/24/19 0554 05/24/19 0737 05/24/19 0935  BP: (!) 137/53 132/60  136/77  Pulse: 96 90 90 95  Resp: 18 18 18 20   Temp: 97.8 F (36.6 C) 98 F (36.7 C)  97.7 F (36.5 C)  TempSrc: Oral Oral  Oral  SpO2: 95% 98% 96% 92%  Weight: 62.9 kg     Height:        Intake/Output Summary (Last 24 hours) at 05/24/2019 1352 Last data filed at 05/24/2019 0600 Gross per 24 hour  Intake 2668.25 ml  Output 0 ml  Net 2668.25 ml   Filed Weights   05/19/19 2028 05/23/19 0428 05/23/19 2124  Weight: 61.2 kg 62.9 kg 62.9 kg    Exam:  . General: 83 y.o. year-old female well-developed well-nourished in no acute distress.  Alert oriented x3.   . Cardiovascular: Regular rate and rhythm no rubs or gallops no JVD or thyromegaly noted.   Marland Kitchen Respiratory: Clear to Auscultation No Wheezes or Rales.  Poor inspiratory effort. . Abdomen: Mild tenderness on palpation epigastric region.  Bowel sounds  present.   . Musculoskeletal: No LE extremity edema.  Palpable pulses in all 4 extremities.    Psychiatry: Mood is appropriate for condition and setting.   Data Reviewed: CBC: Recent Labs  Lab 05/19/19 1926 05/20/19 0500  WBC 10.2 9.8  NEUTROABS 6.5  --   HGB 14.6 12.0  HCT 44.9 37.2  MCV 92.0 93.7  PLT 375 [id number]   Basic Metabolic Panel: Recent Labs  Lab 05/20/19 0248 05/20/19 0253 05/21/19 0552 05/22/19 0357 05/23/19 0537 05/24/19 0527  NA 140 140 139 138 139 141  K 4.2  --  3.0* 3.9 3.7 3.4*  CL 112*  --  100 102 102 107  CO2 14*  --  26 24 27 24   GLUCOSE 110*  --  121* 110* 107* 82  BUN 31*  --  30* 24* 9 <5*  CREATININE 3.59* 3.61* 1.83* 1.38* 1.07* 0.82  CALCIUM 8.3*  --  8.2* 7.9* 7.9* 7.5*   GFR: Estimated Creatinine Clearance: 46.9 mL/min (by C-G formula based on SCr of 0.82 mg/dL). Liver Function Tests: Recent Labs  Lab 05/19/19 1926 05/20/19 0248 05/23/19 0537  AST 44* 27 38  ALT 25 18 19   ALKPHOS 52 40 33*  BILITOT 1.9* 0.6 0.5  PROT 7.3 5.7* 5.5*  ALBUMIN 3.7 2.9* 2.7*   Recent Labs  Lab 05/19/19 1926 05/22/19 0357 05/23/19 0537 05/24/19 0527  LIPASE 64* 103* 111* 89*   No results for input(s): AMMONIA in the last 168 hours. Coagulation Profile: Recent Labs  Lab 05/19/19 1926  INR 1.0   Cardiac Enzymes: No results for input(s): CKTOTAL, CKMB, CKMBINDEX, TROPONINI in the last 168 hours. BNP (last 3 results) Recent Labs    06/08/18 1158  PROBNP 98   HbA1C: No results for input(s): HGBA1C in the last 72 hours. CBG: Recent Labs  Lab 05/23/19 1131 05/23/19 1606 05/23/19 2117 05/24/19 0719 05/24/19 1139  GLUCAP 116* 130* 96 71 89   Lipid Profile: No results for input(s): CHOL, HDL, LDLCALC, TRIG, CHOLHDL, LDLDIRECT in the last 72 hours. Thyroid Function Tests: No results for input(s): TSH, T4TOTAL, FREET4, T3FREE, THYROIDAB in the last 72 hours. Anemia Panel: No results for input(s): VITAMINB12, FOLATE, FERRITIN, TIBC,  IRON, RETICCTPCT in the last 72 hours. Urine analysis:    Component Value Date/Time   COLORURINE YELLOW 05/20/2019 0256   APPEARANCEUR HAZY (A) 05/20/2019 0256   LABSPEC 1.018 05/20/2019 0256   PHURINE 5.0 05/20/2019 0256   GLUCOSEU NEGATIVE 05/20/2019 0256   HGBUR SMALL (A) 05/20/2019 0256   BILIRUBINUR NEGATIVE 05/20/2019 0256   BILIRUBINUR negative 09/10/2018 1523   KETONESUR 5 (A) 05/20/2019 0256   PROTEINUR 30 (A) 05/20/2019 0256   UROBILINOGEN 0.2 09/10/2018 1523   UROBILINOGEN 1.0 03/10/2015 1459   NITRITE NEGATIVE 05/20/2019 0256   LEUKOCYTESUR MODERATE (A) 05/20/2019 0256   Sepsis Labs: @LABRCNTIP (procalcitonin:4,lacticidven:4)  ) Recent Results (from the past 240 hour(s))  Blood Culture (routine x 2)     Status: None   Collection Time: 05/19/19  8:20 PM   Specimen: BLOOD  Result Value Ref Range Status   Specimen Description BLOOD LEFT ANTECUBITAL  Final   Special Requests   Final    BOTTLES DRAWN AEROBIC AND ANAEROBIC Blood Culture results may not be optimal due to an inadequate volume of blood received in culture bottles   Culture   Final    NO GROWTH 5 DAYS Performed at [facility], [place] [address], [place], [place] [address]    Report Status 05/24/2019 FINAL  Final  Blood Culture (routine x 2)     Status: None   Collection Time: 05/19/19  8:20 PM   Specimen: BLOOD LEFT FOREARM  Result Value Ref Range Status   Specimen Description BLOOD LEFT FOREARM  Final   Special Requests   Final    BOTTLES DRAWN AEROBIC AND ANAEROBIC Blood Culture results may not be optimal due to an inadequate volume of blood received in culture bottles   Culture   Final    NO GROWTH 5 DAYS Performed at [facility], [place] [address] [place], [place] [address]    Report Status 05/24/2019 FINAL  Final  SARS CORONAVIRUS 2 (TAT 6-24 HRS) Nasopharyngeal Nasopharyngeal Swab     Status: None   Collection Time: 05/19/19 10:21 PM   Specimen: Nasopharyngeal Swab  Result Value Ref  Range Status   SARS Coronavirus 2 NEGATIVE NEGATIVE Final    Comment: (NOTE) SARS-CoV-2 target nucleic acids are NOT DETECTED. The SARS-CoV-2 RNA is generally detectable in upper and lower respiratory specimens during the acute phase of infection. Negative results do not preclude SARS-CoV-2 infection, do not rule out co-infections with other pathogens, and should not be used as the sole basis for treatment or other patient management decisions. Negative results  must be combined with clinical observations, patient history, and epidemiological information. The expected result is Negative. Fact Sheet for Patients: SugarRoll.be Fact Sheet for Healthcare Providers: https://www.woods-mathews.com/ This test is not yet approved or cleared by the [place] FDA and  has been authorized for detection and/or diagnosis of SARS-CoV-2 by FDA under an Emergency Use Authorization (EUA). This EUA will remain  in effect (meaning this test can be used) for the duration of the COVID-19 declaration under Section 56 4(b)(1) of the Act, 21 U.S.C. section 360bbb-3(b)(1), unless the authorization is terminated or revoked sooner. Performed at [facility], [place] [address] [place], [place] [address]   Urine culture     Status: Abnormal   Collection Time: 05/20/19  2:28 AM   Specimen: In/Out Cath Urine  Result Value Ref Range Status   Specimen Description IN/OUT CATH URINE  Final   Special Requests NONE  Final   Culture (A)  Final    <10,000 COLONIES/mL INSIGNIFICANT GROWTH Performed at [facility], [place] [address] [place], [place] [address]    Report Status 05/21/2019 FINAL  Final      Studies: No results found.  Scheduled Meds: . heparin  5,000 Units Subcutaneous Q8H  . hydroxychloroquine  200 mg Oral BID  . insulin aspart  0-9 Units Subcutaneous TID WC  . methylPREDNISolone  4 mg Oral Q breakfast  . mometasone-formoterol  2 puff  Inhalation BID  . pantoprazole  40 mg Oral Daily  . pravastatin  20 mg Oral QPM    Continuous Infusions: . sodium chloride 125 mL/hr at 05/24/19 1303     LOS: 5 days     Kayleen Memos, MD [facility] Pager [phone]  If 7PM-7AM, please contact night-coverage [email or web address] Password [email or web address] 05/24/2019, 1:52 PM

## 2019-05-24 NOTE — Progress Notes (Signed)
OT Cancellation Note  Patient Details Name: [name] MRN: [id number] DOB: [birth year]   Cancelled Treatment:    Reason Eval/Treat Not Completed: Other (comment). Pt adamantly refusing OT services while here and at home, had a bad experience with [facility] therapy. Says she can manage by herself here and at home. Went to check on a question family member in room had and upon return with answer, pt very apologetic to me and still noes not want therapy. We will sign off.  [place], OTR/L Acute Rehab Services Pager [phone] Office [phone]     Almon Register 05/24/2019, 3:24 PM

## 2019-05-24 NOTE — H&P (View-Only) (Signed)
[facility] Consult: 1:56 PM 05/24/2019  LOS: 5 days    Referring Provider: Dr Irene Pap  Primary Care Physician:  [name], PA-C Primary Gastroenterologist:  Dr. Havery Moros     Reason for Consultation:  Abdominal bloating, pt requested GI eval and Dr Nevada Crane complied.     HPI: [name] is a 83 y.o. female.  Hx of OSA, not on CPAP.  Obesity.  Rheumatoid arthritis.  Hypertension.  Diastolic CHF.  GERD.  Type 2 diabetes. UTIs.  Prior surgeries include cholecystectomy, appendectomy, hysterectomy. Takes low-dose, 4 mg, Medrol daily for her arthritis  2006 colonoscopy by Dr. Vladimir Faster.  2, tubular adenomatous, polyps removed. 08/2017 colonoscopy.  Dr Havery Moros. For weight loss and surveillance of adenomatous polyps.  Flat, 10 mm cecal polyp.  three, 4 to 6 mm, sessile polyps in transverse colon.  Transverse and sigmoid diverticulosis.  Internal hemorrhoids.  Pathology on the polyps were adenomatous, no H GD. 08/2017 EGD.  Subepithelial gastric nodule/mass not bleeding and no stigmata of recent bleeding in the gastric fundus.  Biopsied (fundic gland polyp).  Small subepithelial nodules in the gastric body, biopsied (gastric, antral and oxyntic mucosa with no specific histopathologic changes.  No H. Pylori).   09/2017 Upper EUS.  For evaluation of gastric nodule:  2.3 cm, subepithelial lesion in proximal stomach correlates with densely calcified lesion that appears to involve the wall of the stomach.  No evident associated soft tissue involvement.  Otherwise normal gastric wall.  Limited views of liver, pancreas, spleen all normal.  The subepithelial lesion correlates with a heavily calcified lesion in the proximal gastric wall that has been present by imaging since at least 2007 and has not changed  significantly.  Dr. Ardis Hughs did not feel the lesions were causing her symptoms and would not require any surveillance.   For at least 30months she has been having problems with bloating associated with constipation, epigastric discomfort.  Did not tolerated milk of magnesia in the past, it caused nausea, vomiting.  She was started on daily MiraLAX 3 months ago which allowed her to have regular bowel movements and would relieve the bloating.  However bloating/epigastric discomfort eventually recurr.  Simethicone has not helped.  She is compliant with omeprazole 40 mg and 81 mg ASA daily.  She also takes ibuprofen 800 mg up to twice daily, 3 or 4 times a week.  Takes up to 3 BC powders daily, 3-4 times a week.  Stools are brown, no blood.  No dysphagia.  In recent weeks has not been eating quite as well but no nausea or vomiting. Looking back at her initial [facility] office visit in February 2019, she was complaining of similar epigastric, subxiphoid discomfort but was having regurgitation at that time Presented with 3 days weakness, dizziness and increased BMs.  Nonbloody, noncoffee-ground nausea, vomiting began 24 hours PTA.  These symptoms have all but resolved over the course of her admission but she is now complaining of the chronic bloating.  Labs show mild elevation of lipase to max of 111, 89 today.  LFTs normal. AKI, resolved.  Hypokalemia. No anemia or elevated WBCs. COVID-19 negative CTAP wo contrast: Right lung atelectasis.  Gallbladder surgically absent.  Bile ducts, pancreatic duct normal.  Pancreas, spleen, liver normal.  Stomach, SB, colon Normal other than fluid seen in the rectum. S/p hysterectomy.      Past Medical History:  Diagnosis Date  . Acute diastolic heart failure ([place]) 06/15/2018  . Acute kidney injury ([place]) 01/22/2015  . Acute renal failure ([place])   . Acute respiratory failure with hypoxia ([place]) 06/14/2015  . Allergy   . Anemia    "when I was young"  . Anginal pain  ([place])   . Anxiety   . Arthritis    "all over my body"   . Chest pain 04/07/2014  . Chronic bronchitis ([place])    "get it just about q yr" (06/14/2015)  . Chronic diastolic CHF (congestive heart failure) ([place])   . Chronic diastolic heart failure, NYHA class 1 ([place]) 01/22/2015  . Chronic lower back pain   . Chronic steroid use 12/11/2012  . Constipation 01/09/2017  . COPD (chronic obstructive pulmonary disease) ([place]) 01/09/2017  . Dehydration, moderate 12/11/2012  . 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 DM (diabetes mellitus) ([place]) 04/08/2014  . Dyspnea 08/10/2018  . Dysrhythmia    patient unsure what it is but has been told that.  . E-coli UTI 01/09/2017  . 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 Elevated lactic acid level 01/22/2015  . Essential hypertension 06/15/2018  . Exertional shortness of breath   . Frequent UTI    "recently" (06/14/2015)  . Gastric mass   . GERD (gastroesophageal reflux disease)   . Headache(784.0)    "weekly; wake up w/them " (110/19/2018)  . Heart murmur   . High cholesterol   . History of blood transfusion ~ 1954   "related to my periods"  . Hyperglycemia 12/11/2012  . Hypertension   . Intractable nausea and vomiting 05/15/2017  . Migraine    "once/month maybe" (05/16/2017)  . Multiple falls 06/14/2015  . Noncompliance 12/11/2012  . Orthostasis 06/14/2015  . OSA (obstructive sleep apnea)    "I think I'm using a BiPAP" (06/14/2015)  . Sepsis ([place]) 01/09/2017  . Sepsis secondary to UTI ([place])   . Syncope and collapse 12/11/2012  . Tachycardia with heart rate 100-120 beats per minute 04/08/2014  . Type II diabetes mellitus ([place])   . Uncontrolled type 2 diabetes mellitus with complication ([place])   . UTI (lower urinary tract infection) 12/11/2012  . Weight loss 06/14/2015    Past Surgical History:  Procedure Laterality Date  . APPENDECTOMY  1970   Archie Endo 12/12/2010  . BACK SURGERY    . [place]   Archie Endo 12/12/2010  . EUS N/A 10/16/2017    Procedure: UPPER ENDOSCOPIC ULTRASOUND (EUS) RADIAL;  Surgeon: Milus Banister, MD;  Location: [facility];  Service: Endoscopy;  Laterality: N/A;  . FRACTURE SURGERY    . LAPAROSCOPIC CHOLECYSTECTOMY  1990's  . LUMBAR MICRODISCECTOMY Left 09/2006   L5-S1/notes 12/12/2010  . MULTIPLE EXTRACTIONS WITH ALVEOLOPLASTY Bilateral 06/13/2017   Procedure: MULTIPLE EXTRACTION WITH ALVEOLOPLASTY;  Surgeon: Diona Browner, DDS;  Location: [facility];  Service: Oral Surgery;  Laterality: Bilateral;  . MULTIPLE TOOTH EXTRACTIONS    . ORIF SHOULDER FRACTURE Left ~ 2009   "put cement down in it"   . TONSILLECTOMY  1950's?  . TOTAL ABDOMINAL HYSTERECTOMY  1979   Archie Endo 12/12/2010    Prior to Admission medications   Medication Sig  Start Date End Date Taking? Authorizing Provider  aspirin EC 81 MG tablet Take 1 tablet (81 mg total) by mouth daily. Patient taking differently: Take 81 mg by mouth 3 (three) times a week.  04/09/14  Yes Debbe Odea, MD  atorvastatin (LIPITOR) 10 MG tablet One qd x 1 month, then 2 qd if tolerated Patient taking differently: Take 10-20 mg by mouth See admin instructions. Take 10 mg by mouth once a day for 1 month, then increase to 20 mg once a day (if tolerated) 04/01/19  Yes Rodriguez-Southworth, Sunday Spillers, PA-C  dicyclomine (BENTYL) 20 MG tablet Take 20 mg by mouth 2 (two) times daily. 10/13/17  Yes [provider]  hydroxychloroquine (PLAQUENIL) 200 MG tablet Take 200 mg by mouth 2 (two) times daily.   Yes [provider]  hydrOXYzine (ATARAX/VISTARIL) 10 MG tablet 1 q 8h prn itching Patient taking differently: Take 10 mg by mouth every 8 (eight) hours as needed for itching.  04/22/19  Yes Rodriguez-Southworth, Sunday Spillers, PA-C  ibuprofen (ADVIL,MOTRIN) 800 MG tablet Take 800 mg by mouth daily as needed for pain. 06/27/18  Yes [provider]  lisinopril-hydrochlorothiazide (ZESTORETIC) 10-12.5 MG tablet TAKE 1 TABLET BY MOUTH EVERY DAY Patient taking differently:  Take 1 tablet by mouth daily.  04/12/19  Yes Rodriguez-Southworth, Sunday Spillers, PA-C  methylPREDNISolone (MEDROL) 4 MG tablet Take 4 mg by mouth daily.    Yes [provider]  omeprazole (PRILOSEC) 20 MG capsule Take 20 mg by mouth daily before breakfast.  12/17/16  Yes [provider]  simethicone (GAS-X EXTRA STRENGTH) 125 MG chewable tablet Chew 125 mg by mouth every 6 (six) hours as needed for flatulence.   Yes [provider]  traMADol (ULTRAM) 50 MG tablet Take 1 tablet (50 mg total) by mouth every 12 (twelve) hours as needed for moderate pain. 05/07/19  Yes Rodriguez-Southworth, Sunday Spillers, PA-C  albuterol (ACCUNEB) 0.63 MG/3ML nebulizer solution Take 2 ampules by nebulization every 6 (six) hours as needed for wheezing.    [provider]  budesonide-formoterol (SYMBICORT) 160-4.5 MCG/ACT inhaler Inhale 2 puffs into the lungs 2 (two) times daily. Patient not taking: Reported on 05/21/2019 09/10/18   Rodriguez-Southworth, Sunday Spillers, PA-C  glucose blood (ONETOUCH VERIO) test strip Use to check blood sugar 3 to 4 times a day before meals 03/25/19   Rodriguez-Southworth, Sunday Spillers, PA-C  Semaglutide (RYBELSUS) 7 MG TABS Take 1 tablet by mouth daily for 30 days, THEN 1 tablet daily. X 1 month, then 2 qd. 05/06/19 07/05/19  Rodriguez-Southworth, Sunday Spillers, PA-C    Scheduled Meds: . heparin  5,000 Units Subcutaneous Q8H  . hydroxychloroquine  200 mg Oral BID  . insulin aspart  0-9 Units Subcutaneous TID WC  . methylPREDNISolone  4 mg Oral Q breakfast  . mometasone-formoterol  2 puff Inhalation BID  . pantoprazole  40 mg Oral Daily  . pravastatin  20 mg Oral QPM   Infusions: . sodium chloride 125 mL/hr at 05/24/19 1303   PRN Meds: acetaminophen, albuterol, alum & mag hydroxide-simeth, hydrOXYzine, ondansetron (ZOFRAN) IV, simethicone, traMADol   Allergies as of 05/19/2019  . (No Known Allergies)    Family History  Problem Relation Age of Onset  . Hypertension Mother   .  Heart attack Father   . Diabetes Mellitus II Sister   . Hypertension Sister   . Cancer - Other Sister   . Migraines Niece   . Colon cancer Neg Hx     Social History   Socioeconomic History  . Marital status:  Widowed    Spouse name: Not on file  . Number of children: Not on file  . Years of education: Not on file  . Highest education level: Not on file  Occupational History  . Occupation: retired  Scientific laboratory technician  . Financial resource strain: Not hard at all  . Food insecurity    Worry: Never true    Inability: Never true  . Transportation needs    Medical: No    Non-medical: No  Tobacco Use  . Smoking status: Former Smoker    Types: Cigarettes  . Smokeless tobacco: Never Used  . Tobacco comment: 05/16/2017"1 pack of cigarettes would last 3-4 months; haven't had any cigarettes for 40 years"  Substance and Sexual Activity  . Alcohol use: Not Currently    Frequency: Never    Comment: 05/16/2017 "used to drink beer in the 1990's; don't drink at all now"  . Drug use: No  . Sexual activity: Not Currently    Birth control/protection: Abstinence  Lifestyle  . Physical activity    Days per week: 0 days    Minutes per session: 0 min  . Stress: Not at all  Relationships  . Social Herbalist on phone: Not on file    Gets together: Not on file    Attends religious service: Not on file    Active member of club or organization: Not on file    Attends meetings of clubs or organizations: Not on file    Relationship status: Not on file  . Intimate partner violence    Fear of current or ex partner: No    Emotionally abused: No    Physically abused: No    Forced sexual activity: No  Other Topics Concern  . Not on file  Social History Narrative   Lives at home alone    Her niece comes to check on her everyday. Her son comes by every other day as well   Right handed   Caffeine: none    REVIEW OF SYSTEMS: Constitutional: She walks to her garbage bins daily.  She  sleeps off her front and back porch daily ENT:  No nose bleeds.  Edentulous, her dentures do not fit well. Pulm: Recent shortness of breath, resolved.  No cough. CV:  No palpitations.  No angina.  Has never had swelling in her ankles or feet until it occurred yesterday but it is resolved today. GU:  No hematuria, no frequency GI: See HPI.  Patient avoids solid, difficult to chew foods such as beef because of her difficulty chewing them. Heme: Denies excessive or unusual bleeding or bruising. Transfusions: Does not recall previous transfusion with RBCs or other blood products. Neuro:  No headaches, no peripheral tingling or numbness.  No syncope, no seizures. Derm:  No itching, no rash or sores.  Endocrine:  No sweats or chills.  No polyuria or dysuria Immunization: Reviewed.  She received flu vaccination in early 03/2019. Travel:  None beyond local counties in last few months.    PHYSICAL EXAM: Vital signs in last 24 hours: Vitals:   05/24/19 0737 05/24/19 0935  BP:  136/77  Pulse: 90 95  Resp: 18 20  Temp:  97.7 F (36.5 C)  SpO2: 96% 92%   Wt Readings from Last 3 Encounters:  05/23/19 62.9 kg  04/29/19 63.4 kg  04/22/19 63.7 kg    General: Pleasant, somewhat frail, elderly AAF.  Sitting on bedside commode, comfortable.  Able to transfer back to bed  with minimal assistance. Head: No facial asymmetry or swelling.  No signs of head trauma. Eyes: No scleral icterus or conjunctival pallor. Ears: Not HOH Nose: No congestion or discharge. Mouth: Tongue midline.  Edentulous.  Oral mucosa moist, pink, clear. Neck: No JVD, no masses, no thyromegaly Lungs: Clear bilaterally.  No labored breathing, no cough. Heart: RRR.  No MRG.  S1, S2 present. Abdomen: Soft, nondistended, nontender.  Active bowel sounds.  No HSM, masses, bruits, hernias..   Rectal: Deferred Musc/Skeltl: Deformities in the fingers consistent with RA.  No gross deformity of knees. Extremities: No CCE. Neurologic:  Oriented x3.  Good historian.  Moves all 4 limbs without tremor.  Strength not tested. Skin: No rash, no sores, no telangiectasia. Nodes: No cervical or inguinal adenopathy. Psych: Calm, pleasant, cooperative.  Does not seem depressed or anxious.  Intake/Output from previous day: 10/25 0701 - 10/26 0700 In: 2668.3 [P.O.:360; I.V.:2308.3] Out: 0  Intake/Output this shift: No intake/output data recorded.  LAB RESULTS: No results for input(s): WBC, HGB, HCT, PLT in the last 72 hours. BMET Lab Results  Component Value Date   NA 141 05/24/2019   NA 139 05/23/2019   NA 138 05/22/2019   K 3.4 (L) 05/24/2019   K 3.7 05/23/2019   K 3.9 05/22/2019   CL 107 05/24/2019   CL 102 05/23/2019   CL 102 05/22/2019   CO2 24 05/24/2019   CO2 27 05/23/2019   CO2 24 05/22/2019   GLUCOSE 82 05/24/2019   GLUCOSE 107 (H) 05/23/2019   GLUCOSE 110 (H) 05/22/2019   BUN <5 (L) 05/24/2019   BUN 9 05/23/2019   BUN 24 (H) 05/22/2019   CREATININE 0.82 05/24/2019   CREATININE 1.07 (H) 05/23/2019   CREATININE 1.38 (H) 05/22/2019   CALCIUM 7.5 (L) 05/24/2019   CALCIUM 7.9 (L) 05/23/2019   CALCIUM 7.9 (L) 05/22/2019   LFT Recent Labs    05/23/19 0537  PROT 5.5*  ALBUMIN 2.7*  AST 38  ALT 19  ALKPHOS 33*  BILITOT 0.5   PT/INR Lab Results  Component Value Date   INR 1.0 05/19/2019   INR 1.13 01/22/2015   INR 0.9 11/09/2007   Lipase     Component Value Date/Time   LIPASE 89 (H) 05/24/2019 0527     RADIOLOGY STUDIES: No results found.    IMPRESSION:   *     Chronic abdominal bloating. Resolved acute nausea, vomiting, diarrhea. Suspect IBS.  Symptoms somewhat resolved for up to a day after MiraLAX induced bowel movement.  *     Minor elevation of lipase.  No suspicion for acute pancreatitis.  Surgically absent gallbladder.  Normal biliary tree per CT imaging.  *    Heavy use of prescription strength ibuprofen and BC powders. Takes full-strength omeprazole daily. Benign/stable  fundic gland polyp, benign subepithelial nodules on EGD and upper EUS in 2019.  *     AKI, resolved.  Needs to avoid ibuprofen in future.   *     Hypokalemia.  Overall improved but not resolved.  Previously normal or above normal potassium    PLAN:     *     Per Dr. Rush Landmark.   Azucena Freed  05/24/2019, 1:56 PM Phone [phone]

## 2019-05-24 NOTE — Progress Notes (Signed)
Updated the patient's niece via phone per patient's request.  All questions answered to her satisfaction.

## 2019-05-25 ENCOUNTER — Inpatient Hospital Stay (HOSPITAL_COMMUNITY): Payer: Medicare Other | Admitting: Anesthesiology

## 2019-05-25 ENCOUNTER — Encounter (HOSPITAL_COMMUNITY)
Admission: EM | Disposition: A | Discharge status: Home / Self Care | Payer: Self-pay | Source: Home / Self Care | Attending: Internal Medicine

## 2019-05-25 ENCOUNTER — Encounter (HOSPITAL_COMMUNITY): Payer: Self-pay | Admitting: *Deleted

## 2019-05-25 DIAGNOSIS — D490 Neoplasm of unspecified behavior of digestive system: Secondary | ICD-10-CM

## 2019-05-25 DIAGNOSIS — Q399 Congenital malformation of esophagus, unspecified: Secondary | ICD-10-CM

## 2019-05-25 DIAGNOSIS — K228 Other specified diseases of esophagus: Secondary | ICD-10-CM

## 2019-05-25 HISTORY — PX: BIOPSY: SHX5522

## 2019-05-25 HISTORY — PX: ESOPHAGOGASTRODUODENOSCOPY: SHX5428

## 2019-05-25 LAB — BASIC METABOLIC PANEL
Anion gap: 9 (ref 5–15)
BUN: <5 mg/dL — ABNORMAL LOW (ref 8–23)
CO2: 21 mmol/L — ABNORMAL LOW (ref 22–32)
Calcium: 7.3 mg/dL — ABNORMAL LOW (ref 8.9–10.3)
Chloride: 109 mmol/L (ref 98–111)
Creatinine, Ser: 0.8 mg/dL (ref 0.44–1.00)
GFR calc Af Amer: >60 mL/min (ref >60)
GFR calc non Af Amer: >60 mL/min (ref >60)
Glucose, Bld: 75 mg/dL (ref 70–99)
Potassium: 3.6 mmol/L (ref 3.5–5.1)
Sodium: 139 mmol/L (ref 135–145)

## 2019-05-25 LAB — GLUCOSE, CAPILLARY
Glucose-Capillary: 112 mg/dL — ABNORMAL HIGH (ref 70–99)
Glucose-Capillary: 75 mg/dL (ref 70–99)
Glucose-Capillary: 75 mg/dL (ref 70–99)
Glucose-Capillary: 77 mg/dL (ref 70–99)
Glucose-Capillary: 89 mg/dL (ref 70–99)

## 2019-05-25 SURGERY — EGD (ESOPHAGOGASTRODUODENOSCOPY)
Anesthesia: Monitor Anesthesia Care

## 2019-05-25 MED ORDER — PROPOFOL 10 MG/ML IV BOLUS
INTRAVENOUS | Status: DC | PRN
  Administered 2019-05-25: 15 mg via INTRAVENOUS

## 2019-05-25 MED ORDER — LACTATED RINGERS IV SOLN
INTRAVENOUS | Status: DC
  Administered 2019-05-25: 09:00:00 via INTRAVENOUS

## 2019-05-25 MED ORDER — BUTAMBEN-TETRACAINE-BENZOCAINE 2-2-14 % EX AERO
INHALATION_SPRAY | CUTANEOUS | Status: DC | PRN
  Administered 2019-05-25: 2 via TOPICAL

## 2019-05-25 MED ORDER — CALCIUM GLUCONATE-NACL 1-0.675 GM/50ML-% IV SOLN
1.0000 g | Freq: Once | INTRAVENOUS | Status: AC
  Administered 2019-05-25: 1000 mg via INTRAVENOUS
  Filled 2019-05-25: qty 50

## 2019-05-25 MED ORDER — PROPOFOL 500 MG/50ML IV EMUL
INTRAVENOUS | Status: DC | PRN
  Administered 2019-05-25: 150 ug/kg/min via INTRAVENOUS

## 2019-05-25 MED ORDER — PANTOPRAZOLE SODIUM 40 MG PO TBEC: 40.0000 mg | DELAYED_RELEASE_TABLET | Freq: Two times a day (BID) | ORAL | Status: DC

## 2019-05-25 MED ORDER — SODIUM CHLORIDE 0.9 % IV SOLN: INTRAVENOUS | Status: DC

## 2019-05-25 MED ORDER — LIDOCAINE 2% (20 MG/ML) 5 ML SYRINGE
INTRAMUSCULAR | Status: DC | PRN
  Administered 2019-05-25: 40 mg via INTRAVENOUS

## 2019-05-25 MED ORDER — SUCRALFATE 1 GM/10ML PO SUSP
1.0000 g | Freq: Three times a day (TID) | ORAL | Status: DC
  Administered 2019-05-25 – 2019-05-26 (×4): 1 g via ORAL
  Filled 2019-05-25 (×4): qty 10

## 2019-05-25 NOTE — Anesthesia Postprocedure Evaluation (Signed)
Anesthesia Post Note  Patient: [name]  Procedure(s) Performed: ESOPHAGOGASTRODUODENOSCOPY (EGD) (N/A ) BIOPSY     Patient location during evaluation: Endoscopy Anesthesia Type: MAC Level of consciousness: awake and alert Pain management: pain level controlled Vital Signs Assessment: post-procedure vital signs reviewed and stable Respiratory status: spontaneous breathing, nonlabored ventilation, respiratory function stable and patient connected to nasal cannula oxygen Cardiovascular status: blood pressure returned to baseline and stable Postop Assessment: no apparent nausea or vomiting Anesthetic complications: no    Last Vitals:  Vitals:   05/25/19 1026 05/25/19 1035  BP: 134/62 132/60  Pulse: 98 95  Resp: (!) 34 (!) 25  Temp:    SpO2: 94% 94%    Last Pain:  Vitals:   05/25/19 1014  TempSrc: Temporal  PainSc: 0-No pain                 Chelsey L Woodrum

## 2019-05-25 NOTE — Interval H&P Note (Signed)
History and Physical Interval Note:  05/25/2019 9:37 AM  [name]  has presented today for surgery, with the diagnosis of Abdominal pain post-prandial.  The various methods of treatment have been discussed with the patient and family. After consideration of risks, benefits and other options for treatment, the patient has consented to  Procedure(s): ESOPHAGOGASTRODUODENOSCOPY (EGD) (N/A) as a surgical intervention.  The patient's history has been reviewed, patient examined, no change in status, stable for surgery.  I have reviewed the patient's chart and labs.  Questions were answered to the patient's satisfaction.     [employer]

## 2019-05-25 NOTE — Anesthesia Preprocedure Evaluation (Addendum)
Anesthesia Evaluation  Patient identified by MRN, date of birth, ID band Patient awake    Reviewed: Allergy & Precautions, NPO status , Patient's Chart, lab work & pertinent test results  Airway Mallampati: II  TM Distance: >3 FB Neck ROM: Full    Dental no notable dental hx. (+) Edentulous Upper, Edentulous Lower   Pulmonary sleep apnea and Continuous Positive Airway Pressure Ventilation , COPD, former smoker,    Pulmonary exam normal breath sounds clear to auscultation       Cardiovascular hypertension, + angina +CHF  Normal cardiovascular exam Rhythm:Regular Rate:Normal  TTE 08/2018  1. The left ventricle has normal systolic function of 93-90%. The cavity size was normal. There is mild concentric left ventricular hypertrophy. Echo evidence of impaired diastolic relaxation. Indeterminate filling pressures.  2. No evidence of left ventricular regional wall motion abnormalities.  3. The right ventricle has normal systolic function. The cavity was normal. There is no increase in right ventricular wall thickness.  4. The mitral valve is normal in structure.  5. The tricuspid valve is normal in structure.  6. The aortic valve is tricuspid. There is mild thickening and sclerosis without any evidence of stenosis of the aortic valve.  7. The pulmonic valve was normal in structure.  8. No significant change since 01/09/2017.   Neuro/Psych  Headaches, negative psych ROS   GI/Hepatic Neg liver ROS, GERD  Medicated,  Endo/Other  negative endocrine ROSdiabetes  Renal/GU Renal InsufficiencyRenal disease  negative genitourinary   Musculoskeletal  (+) Arthritis , Rheumatoid disorders,    Abdominal   Peds  Hematology negative hematology ROS (+)   Anesthesia Other Findings EGD for N/V  Reproductive/Obstetrics                           Anesthesia Physical Anesthesia Plan  ASA: III  Anesthesia Plan: MAC    Post-op Pain Management:    Induction: Intravenous  PONV Risk Score and Plan: Propofol infusion and Treatment may vary due to age or medical condition  Airway Management Planned: Natural Airway  Additional Equipment:   Intra-op Plan:   Post-operative Plan:   Informed Consent: I have reviewed the patients History and Physical, chart, labs and discussed the procedure including the risks, benefits and alternatives for the proposed anesthesia with the patient or authorized representative who has indicated his/her understanding and acceptance.     Dental advisory given  Plan Discussed with: CRNA  Anesthesia Plan Comments:         Anesthesia Quick Evaluation

## 2019-05-25 NOTE — Op Note (Signed)
[facility] Patient Name: [name] Procedure Date : 05/25/2019 MRN: [id number] Attending MD: Justice Britain , MD Date of Birth: Apr 02, 1936 CSN: [id number] Age: 83 Admit Type: Inpatient Procedure:                Upper GI endoscopy Indications:              Chronic epigastric abdominal distress, Chronic                            abdominal bloating, Nausea with vomiting                            intermittent, Chronic abdominal distention Providers:                Justice Britain, MD, Josie Dixon, RN, Cletis Athens, Technician Referring MD:             [facility], Carlota Raspberry. Havery Moros, MD,                            Milus Banister, MD Medicines:                Monitored Anesthesia Care Complications:            No immediate complications. Estimated Blood Loss:     Estimated blood loss was minimal. Procedure:                Pre-Anesthesia Assessment:                           - Prior to the procedure, a History and Physical                            was performed, and patient medications and                            allergies were reviewed. The patient's tolerance of                            previous anesthesia was also reviewed. The risks                            and benefits of the procedure and the sedation                            options and risks were discussed with the patient.                            All questions were answered, and informed consent                            was obtained. Prior Anticoagulants: The patient has  taken no previous anticoagulant or antiplatelet                            agents except for aspirin. ASA Grade Assessment:                            III - A patient with severe systemic disease. After                            reviewing the risks and benefits, the patient was                            deemed in satisfactory condition to undergo the                             procedure.                           After obtaining informed consent, the endoscope was                            passed under direct vision. Throughout the                            procedure, the patient's blood pressure, pulse, and                            oxygen saturations were monitored continuously. The                            GIF-H190 ([id number]) Olympus gastroscope was                            introduced through the mouth, and advanced to the                            second part of duodenum. The upper GI endoscopy was                            accomplished without difficulty. The patient                            tolerated the procedure. Scope In: Scope Out: Findings:      No gross mucosal lesions were noted in the entire esophagus. Biopsies       were taken with a cold forceps for histology to rule out EoE and LoE.      The examined esophagus was significantly tortuous.      The Z-line was irregular and was found 36 cm from the incisors.      A 1 cm hiatal hernia was present.      A large, submucosal, non-circumferential mass with no bleeding and no       stigmata of recent bleeding was found in the cardia. Previously       evaluated by EUS within the last 1.5 years and present  on imaging for       >13 years.      Two 4 to 8 mm submucosal papules (nodules) with no bleeding and no       stigmata of recent bleeding were found on the lesser curvature of the       stomach. Tunneled biopsies were taken with a cold forceps for histology       from the larger nodule.      Patchy mildly erythematous mucosa without bleeding was found in the       entire examined stomach. Biopsies were taken with a cold forceps for       histology and Helicobacter pylori testing.      No gross mucosal lesions were noted in the duodenal bulb, in the first       portion of the duodenum and in the second portion of the duodenum.       Biopsies were taken with a cold  forceps for histology to rule out       enteropathy. Impression:               - No gross mucosal lesions in esophagus. Tortuous                            esophagus. Z-line irregular, 36 cm from the                            incisors. 1 cm hiatal hernia.                           - Submucosal lesion in the cardia, previously                            tunnel biopsied and EUS performed by partner and                            present on CT Imaging without significant change                            for >13-years.                           - Two submucosal papules (nodules) found in the                            stomach. Tunnel biopsied the larger of these                            lesions..                           - Erythematous mucosa in the stomach. Biopsied for                            HP.                           - No gross lesions in the duodenal bulb, in the  first portion of the duodenum and in the second                            portion of the duodenum. Biopsied for enteropathy                            rule out. Recommendation:           - The patient will be observed post-procedure,                            until all discharge criteria are met.                           - Return patient to hospital ward for ongoing care.                           - Continue PPI BID 40 mg x 6-weeks and then                            decrease to once daily.                           - Can consider Carafate suspension BID + QHS in                            effort of coating upper GI tract to see if helpful,                            though not clear that it will aid in healing as no                            evidence of severe inflammation is present in the                            visualized upper GI tract.                           - Observe patient's clinical course.                           - Less likely, but could consider send Fecal                             elastase to further evaluate bloating. Could also                            consider SIBO testing in future to rule out SIBO.                            Empiric treatment for SIBO with Xifaxin is                            expensive and  not clear she could continue it as an                            outpatient if she is started here without                            documented testing. Ideal to get testing before                            treatment with other antimicrobials.                           - ADAT.                           - Follow up pathology.                           - Previous EUS performed for larger submucosal                            lesion. Defer to primary GI and previous Advanced                            Endoscopist to further surveillance or monitoring                            based on results.                           - The findings and recommendations were discussed                            with the patient.                           - The findings and recommendations were discussed                            with the referring physician. Procedure Code(s):        --- Professional ---                           [phone], Esophagogastroduodenoscopy, flexible,                            transoral; with biopsy, single or multiple Diagnosis Code(s):        --- Professional ---                           Q39.9, Congenital malformation of esophagus,                            unspecified                           K22.8, Other specified diseases of esophagus  K44.9, Diaphragmatic hernia without obstruction or                            gangrene                           D49.0, Neoplasm of unspecified behavior of                            digestive system                           K31.89, Other diseases of stomach and duodenum                           R10.13, Epigastric pain                           R14.0, Abdominal  distension (gaseous)                           R11.2, Nausea with vomiting, unspecified CPT copyright 2019 American Medical Association. All rights reserved. The codes documented in this report are preliminary and upon coder review may  be revised to meet current compliance requirements. Justice Britain, MD 05/25/2019 10:39:03 AM Number of Addenda: 0

## 2019-05-25 NOTE — Anesthesia Procedure Notes (Signed)
Procedure Name: MAC Date/Time: 05/25/2019 9:48 AM Performed by: Janace Litten, CRNA Pre-anesthesia Checklist: Patient identified, Emergency Drugs available, Suction available and Patient being monitored Patient Re-evaluated:Patient Re-evaluated prior to induction Oxygen Delivery Method: Simple face mask

## 2019-05-25 NOTE — Progress Notes (Addendum)
PROGRESS NOTE  [name] [id number] DOB: [birth year] DOA: 05/19/2019 PCP: [name], PA-C  HPI/Recap of past 24 hours:  [name] is a 83 y.o. female with medical history significant of chronic diastolic heart failure, COPD, hypertension, type II diabetes, rheumatoid arthritis who presents with concerns of dizziness and weakness for the past 3 days.  She also noticed increased bowel movement.  Denies any diarrhea but reports that she is on MiraLAX and recently stopped this due to increased bowel movement.  Also has nausea and vomiting that started yesterday.  Has had decreased appetite. Notes epigastric abdominal pain.   Denies any fevers.  Denies any chest pain or shortness of breath.  No new cough or runny nose.   ED Course: She is afebrile and and hypotensive down to 80 over 40s but improved with 3 L of LR fluid resuscitation.  CBC showed no leukocytosis or anemia.  CMP showed mildly elevated potassium of 5.2.  Creatinine significantly elevated at 4.93 and BUN of 29.  Prior creatinine 2 months ago was at 1.45.  Mildly elevated total bilirubin of 1.9.  Anion gap of 18.  Lactate of 3.9. COVID test pending.  CT head was obtained by ED PA since patient endorsed occasional right upper extremity weakness but had no acute findings.  CT abdomen pelvis showed no evidence of any acute diverticulitis or urethral obstructions.  Hospital course complicated intermittent abdominal pain worse in epigastric region.  Lipase trending up from 64 on 05/19/19 to 103 on 05/22/2019.  Last endoscopy and colonoscopy done at [facility] [place] were unrevealing.  Completed 3 days of IV antibiotics cefepime and IV Flagyl empirically.  Afebrile with no leukocytosis.  05/25/19: Patient was seen and examined at her bedside this morning.  No acute events overnight.  Her abdominal pain is mildly  improved.  Post EGD done on 05/25/19 with findings as stated below. - No gross mucosal lesions in esophagus.  Tortuous esophagus. Z-line irregular, 36 cm from the incisors. 1 cm hiatal hernia. - Submucosal lesion in the cardia, previously tunnel biopsied and EUS performed by partner and present on CT Imaging without significant change for >13-years. - Two submucosal papules (nodules) found in the stomach. Tunnel biopsied the larger of these lesions.. - Erythematous mucosa in the stomach. Biopsied for HP. - No gross lesions in the duodenal bulb, in the first portion of the duodenum and in the second portion of the duodenum. Biopsied for enteropathy rule out   Assessment/Plan: Active Problems:   Rheumatoid arthritis ([place])   Sepsis ([place])  SIRS unclear etiology, possible intra-abdominal process Presented with lactic acidosis, tachycardia, positive urine analysis. CT abdomen pelvis unrevealing Urine analysis with pyuria and negative urine culture Blood cultures x2 peripherally negative to date.   Completed 3 days of IV cefepime empirically, started on 05/19/2019, ending 05/22/2019. Post EGD 05/25/19 GI recommendations for p.o. Protonix 40 mg twice daily and Carafate 4 times daily. Biopsies taken, follow results  Intractable abdominal pain post EGD on 05/25/2019 Findings as stated above GI recommendations for p.o. Protonix 40 mg twice daily and Carafate 4 times daily. Biopsies taken, follow results  Elevated lipase, trending down No sign of acute pancreatitis on CT abdomen and pelvis done on admission Lipase levels 103 on 05/22/2019>> 111>> 89 on 05/24/19 Denies history of alcohol use. Last LDL 243 on 08/13/18. Currently on NS at 125cc/hr Intermittent epigastric pain, worse after eating Requests GI consult, GI [facility] will see  Improving AKI on CKD 3, likely secondary to poor renal  perfusion Appears to be at his baseline creatinine 1.3 with GFR of 36 Renal function continues to improve Presented with creatinine of 4.91 with GFR of 9 Continue to avoid nephrotoxins Continue to monitor  urine output Maintain map greater than 65 to avoid renal hypoprofusion Continue to monitor electrolytes and daily BMPs.  Hypocalcemia Calcium 7.3 on third on 05/25/2019 Albumin 2.7 Corrected calcium 8.3 Replete with IV calcium gluconate 1 g once.  Rheumatoid arthritis C/w p.o. Medrol 4 mg daily and tramadol as needed C/w plaquenil  Generalized weakness/physical debility PT OT to assess OT recommended home health OT with 24 supervision and assistance. Continue fall precaution Out of bed to chair every shift with assistance and fall precautions.  Resolved high anion gap metabolic acidosis Presented with chemistry bicarb of 15, anion gap of 18 and AKI Completed isotonic bicarb at 75 cc/h Chemistry bicarb 26 with anion gap of 13 on 05/21/2019 Chemistry bicarb 24 and anion gap of 12 on 05/22/2019 Continue to monitor electrolytes Continue daily BMPs  Resolved post repletion: Hypokalemia Potassium 3.0>> 3.9 on 05/22/2019. Repleted with p.o. KCl 40 mEq x 2.  Pyuria Urine culture negative History of Klebsiella pneumonia and E. coli UTI in 2018  Resolving intractable nausea frequent stool Continue IV antiemetics as needed Recently MiraLAX  Chronic diastolic CHF Appears euvolemic on exam Last 2D echo done on 09/07/2018 showed LVEF 60-65% Continue strict I's and O's and daily weight Continue to monitor volume status  Type 2 diabetes Well controlled Last A1c 8.2 on August 13 th 2020 Continue sensitive insulin sliding scale Avoid hypoglycemia in the setting of renal insufficiency and insulin use. Continue CBGs    Code Status: Full code  Family Communication: None at bedside  Disposition Plan: Possible discharge to home with home health services on 05/26/2019.   Consultants:  None  Procedures:  None  Antimicrobials:  None.  DVT prophylaxis: Subcu heparin 3 times daily.   Objective: Vitals:   05/25/19 0915 05/25/19 1014 05/25/19 1026 05/25/19 1035  BP:  (!) 187/87 (!) 113/56 134/62 132/60  Pulse: 95 (!) 101 98 95  Resp: 20 (!) 24 (!) 34 (!) 25  Temp: 98.3 F (36.8 C) (!) 97.2 F (36.2 C)    TempSrc: Oral Temporal    SpO2: 94% 100% 94% 94%  Weight:      Height:        Intake/Output Summary (Last 24 hours) at 05/25/2019 1351 Last data filed at 05/25/2019 1015 Gross per 24 hour  Intake 3213.03 ml  Output 5 ml  Net 3208.03 ml   Filed Weights   05/19/19 2028 05/23/19 0428 05/23/19 2124  Weight: 61.2 kg 62.9 kg 62.9 kg    Exam:  . General: 83 y.o. year-old female well-developed well-nourished.  Alert and oriented x4. . Cardiovascular: Regular rate and rhythm no rubs or gallops.  Marland Kitchen Respiratory: Clear to auscultation no wheezes or rales.   . Abdomen: Mild diffuse tenderness on palpation.  Bowel sounds present.   Musculoskeletal: No lower extremity edema.   Psychiatry: Mood is appropriate for condition    Data Reviewed: CBC: Recent Labs  Lab 05/19/19 1926 05/20/19 0500  WBC 10.2 9.8  NEUTROABS 6.5  --   HGB 14.6 12.0  HCT 44.9 37.2  MCV 92.0 93.7  PLT 375 [id number]   Basic Metabolic Panel: Recent Labs  Lab 05/21/19 0552 05/22/19 0357 05/23/19 0537 05/24/19 0527 05/25/19 0746  NA 139 138 139 141 139  K 3.0* 3.9 3.7 3.4* 3.6  CL 100  102 102 107 109  CO2 26 24 27 24  21*  GLUCOSE 121* 110* 107* 82 75  BUN 30* 24* 9 <5* <5*  CREATININE 1.83* 1.38* 1.07* 0.82 0.80  CALCIUM 8.2* 7.9* 7.9* 7.5* 7.3*   GFR: Estimated Creatinine Clearance: 48.1 mL/min (by C-G formula based on SCr of 0.8 mg/dL). Liver Function Tests: Recent Labs  Lab 05/19/19 1926 05/20/19 0248 05/23/19 0537  AST 44* 27 38  ALT 25 18 19   ALKPHOS 52 40 33*  BILITOT 1.9* 0.6 0.5  PROT 7.3 5.7* 5.5*  ALBUMIN 3.7 2.9* 2.7*   Recent Labs  Lab 05/19/19 1926 05/22/19 0357 05/23/19 0537 05/24/19 0527  LIPASE 64* 103* 111* 89*   No results for input(s): AMMONIA in the last 168 hours. Coagulation Profile: Recent Labs  Lab 05/19/19 1926  INR  1.0   Cardiac Enzymes: No results for input(s): CKTOTAL, CKMB, CKMBINDEX, TROPONINI in the last 168 hours. BNP (last 3 results) Recent Labs    06/08/18 1158  PROBNP 98   HbA1C: No results for input(s): HGBA1C in the last 72 hours. CBG: Recent Labs  Lab 05/24/19 1621 05/24/19 2045 05/25/19 0004 05/25/19 0716 05/25/19 1137  GLUCAP 110* 70 75 75 77   Lipid Profile: No results for input(s): CHOL, HDL, LDLCALC, TRIG, CHOLHDL, LDLDIRECT in the last 72 hours. Thyroid Function Tests: No results for input(s): TSH, T4TOTAL, FREET4, T3FREE, THYROIDAB in the last 72 hours. Anemia Panel: No results for input(s): VITAMINB12, FOLATE, FERRITIN, TIBC, IRON, RETICCTPCT in the last 72 hours. Urine analysis:    Component Value Date/Time   COLORURINE YELLOW 05/20/2019 0256   APPEARANCEUR HAZY (A) 05/20/2019 0256   LABSPEC 1.018 05/20/2019 0256   PHURINE 5.0 05/20/2019 0256   GLUCOSEU NEGATIVE 05/20/2019 0256   HGBUR SMALL (A) 05/20/2019 0256   BILIRUBINUR NEGATIVE 05/20/2019 0256   BILIRUBINUR negative 09/10/2018 1523   KETONESUR 5 (A) 05/20/2019 0256   PROTEINUR 30 (A) 05/20/2019 0256   UROBILINOGEN 0.2 09/10/2018 1523   UROBILINOGEN 1.0 03/10/2015 1459   NITRITE NEGATIVE 05/20/2019 0256   LEUKOCYTESUR MODERATE (A) 05/20/2019 0256   Sepsis Labs: @LABRCNTIP (procalcitonin:4,lacticidven:4)  ) Recent Results (from the past 240 hour(s))  Blood Culture (routine x 2)     Status: None   Collection Time: 05/19/19  8:20 PM   Specimen: BLOOD  Result Value Ref Range Status   Specimen Description BLOOD LEFT ANTECUBITAL  Final   Special Requests   Final    BOTTLES DRAWN AEROBIC AND ANAEROBIC Blood Culture results may not be optimal due to an inadequate volume of blood received in culture bottles   Culture   Final    NO GROWTH 5 DAYS Performed at [facility], [place] [address], [place], [place] [address]    Report Status 05/24/2019 FINAL  Final  Blood Culture (routine x 2)     Status:  None   Collection Time: 05/19/19  8:20 PM   Specimen: BLOOD LEFT FOREARM  Result Value Ref Range Status   Specimen Description BLOOD LEFT FOREARM  Final   Special Requests   Final    BOTTLES DRAWN AEROBIC AND ANAEROBIC Blood Culture results may not be optimal due to an inadequate volume of blood received in culture bottles   Culture   Final    NO GROWTH 5 DAYS Performed at [facility], [place] [address] [place], [place] [address]    Report Status 05/24/2019 FINAL  Final  SARS CORONAVIRUS 2 (TAT 6-24 HRS) Nasopharyngeal Nasopharyngeal Swab  Status: None   Collection Time: 05/19/19 10:21 PM   Specimen: Nasopharyngeal Swab  Result Value Ref Range Status   SARS Coronavirus 2 NEGATIVE NEGATIVE Final    Comment: (NOTE) SARS-CoV-2 target nucleic acids are NOT DETECTED. The SARS-CoV-2 RNA is generally detectable in upper and lower respiratory specimens during the acute phase of infection. Negative results do not preclude SARS-CoV-2 infection, do not rule out co-infections with other pathogens, and should not be used as the sole basis for treatment or other patient management decisions. Negative results must be combined with clinical observations, patient history, and epidemiological information. The expected result is Negative. Fact Sheet for Patients: SugarRoll.be Fact Sheet for Healthcare Providers: https://www.woods-mathews.com/ This test is not yet approved or cleared by the [place] FDA and  has been authorized for detection and/or diagnosis of SARS-CoV-2 by FDA under an Emergency Use Authorization (EUA). This EUA will remain  in effect (meaning this test can be used) for the duration of the COVID-19 declaration under Section 56 4(b)(1) of the Act, 21 U.S.C. section 360bbb-3(b)(1), unless the authorization is terminated or revoked sooner. Performed at [facility], [place] [address] [place], [place] [address]   Urine  culture     Status: Abnormal   Collection Time: 05/20/19  2:28 AM   Specimen: In/Out Cath Urine  Result Value Ref Range Status   Specimen Description IN/OUT CATH URINE  Final   Special Requests NONE  Final   Culture (A)  Final    <10,000 COLONIES/mL INSIGNIFICANT GROWTH Performed at [facility], [place] [address], [place], [place] [address]    Report Status 05/21/2019 FINAL  Final      Studies: No results found.  Scheduled Meds: . heparin  5,000 Units Subcutaneous Q8H  . hydroxychloroquine  200 mg Oral BID  . insulin aspart  0-9 Units Subcutaneous TID WC  . methylPREDNISolone  4 mg Oral Q breakfast  . mometasone-formoterol  2 puff Inhalation BID  . [START ON 07/06/2019] pantoprazole  40 mg Oral BID  . pravastatin  20 mg Oral QPM  . sucralfate  1 g Oral TID WC & HS    Continuous Infusions:    LOS: 6 days     Kayleen Memos, MD [facility] Pager [phone]  If 7PM-7AM, please contact night-coverage [email or web address] Password [email or web address] 05/25/2019, 1:51 PM

## 2019-05-25 NOTE — Transfer of Care (Signed)
Immediate Anesthesia Transfer of Care Note  Patient: [name]  Procedure(s) Performed: ESOPHAGOGASTRODUODENOSCOPY (EGD) (N/A ) BIOPSY  Patient Location: PACU  Anesthesia Type:MAC  Level of Consciousness: drowsy and patient cooperative  Airway & Oxygen Therapy: Patient Spontanous Breathing  Post-op Assessment: Report given to RN and Post -op Vital signs reviewed and stable  Post vital signs: Reviewed and stable  Last Vitals:  Vitals Value Taken Time  BP    Temp    Pulse    Resp    SpO2      Last Pain:  Vitals:   05/25/19 0915  TempSrc: Oral  PainSc: 9       Patients Stated Pain Goal: 0 (79/03/83 3383)  Complications: No apparent anesthesia complications

## 2019-05-25 NOTE — TOC Initial Note (Signed)
Transition of Care [facility]) - Initial/Assessment Note    Patient Details  Name: [name] MRN: [id number] Date of Birth: Aug 19, 1935  Transition of Care [facility]) CM/SW Contact:    Bartholomew Crews, RN Phone Number: [phone] 05/25/2019, 1:55 PM  Clinical Narrative:                 Chart reviewed. Patient home alone. Has support system - exhusband and niece. Noted OT recommendations and that patient was declining [place] services or DME - walker, cane. PT has been unable to eval patient to date. EGD today. TOC following for transition needs.   Expected Discharge Plan: Home/Self Care Barriers to Discharge: Continued Medical Work up   Patient Goals and CMS Choice   CMS Medicare.gov Compare Post Acute Care list provided to:: Patient Choice offered to / list presented to : Patient  Expected Discharge Plan and Services Expected Discharge Plan: Home/Self Care   Discharge Planning Services: CM Consult Post Acute Care Choice: [place] arrangements for the past 2 months: Apartment                 DME Arranged: N/A DME Agency: NA       HH Arranged: Refused [place] Agency: NA        Prior Living Arrangements/Services Living arrangements for the past 2 months: Apartment Lives with:: Self                   Activities of Daily Living Home Assistive Devices/Equipment: None ADL Screening (condition at time of admission) Patient's cognitive ability adequate to safely complete daily activities?: Yes Is the patient deaf or have difficulty hearing?: No Does the patient have difficulty seeing, even when wearing glasses/contacts?: No Does the patient have difficulty concentrating, remembering, or making decisions?: No Patient able to express need for assistance with ADLs?: Yes Does the patient have difficulty dressing or bathing?: No Independently performs ADLs?: No Communication: Independent Dressing (OT): Independent Grooming: Independent Feeding: Independent Bathing:  Independent Toileting: Independent In/Out Bed: Independent Does the patient have difficulty walking or climbing stairs?: No Weakness of Legs: None Weakness of Arms/Hands: None  Permission Sought/Granted                  Emotional Assessment              Admission diagnosis:  Generalized abdominal pain [R10.84] Weakness of right upper extremity [R29.898] AKI (acute kidney injury) ([place]) [N17.9] Nausea vomiting and diarrhea [R11.2, R19.7] Sepsis with acute renal failure without septic shock, due to unspecified organism, unspecified acute renal failure type ([place]) [A41.9, R65.20, N17.9] Patient Active Problem List   Diagnosis Date Noted  . History of COPD   . Other chronic pain 04/22/2019  . Dyspnea 08/10/2018  . Acute diastolic heart failure ([place]) 06/15/2018  . Essential hypertension 06/15/2018  . 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 Gastric mass   . Sepsis secondary to UTI ([place])   . Uncontrolled type 2 diabetes mellitus with complication ([place])   . Acute renal failure ([place])   . Chronic diastolic CHF (congestive heart failure) ([place])   . E-coli UTI 01/09/2017  . Sepsis ([place]) 01/09/2017  . Constipation 01/09/2017  . COPD (chronic obstructive pulmonary disease) ([place]) 01/09/2017  . Multiple falls 06/14/2015  . Weight loss 06/14/2015  . Acute respiratory failure with hypoxia ([place]) 06/14/2015  . Orthostasis 06/14/2015  . Acute respiratory failure ([place]) 06/14/2015  . Elevated lactic acid level 01/22/2015  . 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 Chronic diastolic  heart failure, NYHA class 1 ([place]) 01/22/2015  . Tachycardia with heart rate 100-120 beats per minute 04/08/2014  . DM (diabetes mellitus) ([place]) 04/08/2014  . Chest pain 04/07/2014  . Hyperglycemia 12/11/2012  . Syncope and collapse 12/11/2012  . Dehydration, moderate 12/11/2012  . Noncompliance 12/11/2012  . UTI (lower urinary tract infection) 12/11/2012  . Chronic steroid use 12/11/2012  . Rheumatoid arthritis  ([place]) 12/11/2012   PCP:  [name], PA-C Pharmacy:   [facility] [place], [place] - [address] [place] [address] Phone: [phone] Fax: [phone]     Social Determinants of Health (SDOH) Interventions    Readmission Risk Interventions No flowsheet data found.

## 2019-05-26 ENCOUNTER — Encounter (HOSPITAL_COMMUNITY): Payer: Self-pay | Admitting: Gastroenterology

## 2019-05-26 LAB — SURGICAL PATHOLOGY

## 2019-05-26 LAB — GLUCOSE, CAPILLARY
Glucose-Capillary: 85 mg/dL (ref 70–99)
Glucose-Capillary: 128 mg/dL — ABNORMAL HIGH (ref 70–99)

## 2019-05-26 MED ORDER — PANTOPRAZOLE SODIUM 40 MG PO TBEC: 40.0000 mg | DELAYED_RELEASE_TABLET | Freq: Two times a day (BID) | ORAL | 0 refills | Status: DC

## 2019-05-26 MED ORDER — SUCRALFATE 1 GM/10ML PO SUSP: 1.0000 g | Freq: Three times a day (TID) | ORAL | 0 refills | Status: DC

## 2019-05-26 NOTE — Progress Notes (Signed)
[name] to be discharged Home per MD order. Discussed prescriptions and follow up appointments with the patient. Prescriptions given to patient; medication list explained in detail. Patient verbalized understanding.  Skin clean, dry and intact without evidence of skin break down, no evidence of skin tears noted. IV catheter discontinued intact. Site without signs and symptoms of complications. Dressing and pressure applied. Pt denies pain at the site currently. No complaints noted.  Patient free of lines, drains, and wounds.   An After Visit Summary (AVS) was printed and given to the patient. Patient escorted via wheelchair, and discharged home via private auto.  Shela Commons, RN

## 2019-05-26 NOTE — Discharge Summary (Signed)
Discharge Summary  [name] [id number] DOB: 1936/04/18  PCP: [name], PA-C  Admit date: 05/19/2019 Discharge date: 05/26/2019  Time spent: 35 minutes  Recommendations for Outpatient Follow-up:  1. Follow-up with GI 2. Follow-up with your PCP 3. Take your medications as prescribed 4. Fall precautions  Discharge Diagnoses:  Active Hospital Problems   Diagnosis Date Noted  . Sepsis ([place]) 01/09/2017  . Rheumatoid arthritis ([place]) 12/11/2012    Resolved Hospital Problems  No resolved problems to display.    Discharge Condition: Stable  Diet recommendation: Resume previous diet.  Vitals:   05/26/19 0745 05/26/19 0755  BP:  132/79  Pulse:  89  Resp:  18  Temp:  98.5 F (36.9 C)  SpO2: 94% 95%    History of present illness:  Vicki Sullivanis a 83 y.o.femalewith medical history significant ofchronic diastolic heart failure, COPD, hypertension, type II diabetes, rheumatoid arthritis who presents with concerns of dizziness and weakness for the past 3 days. She also noticed increased bowel movement. Denies any diarrhea but reportsthat she is on MiraLAX and recently stopped this due to increased bowel movement. Also hasnausea and vomiting that started yesterday. Has had decreased appetite. Notes epigastric abdominal pain.  Denies any fevers. Denies any chest pain or shortness of breath. No new cough or runny nose.  ED Course:She is afebrile and and hypotensive down to 80 over 40s but improved with 3 L of LR fluid resuscitation. CBC showed no leukocytosis or anemia. CMP showed mildly elevated potassium of 5.2. Creatinine significantly elevated at 4.93 and BUN of 29. Prior creatinine 2 months ago was at 1.45. Mildly elevated total bilirubin of 1.9. Anion gap of 18. Lactate of 3.9. COVID test pending.CT head was obtained by ED PA since patient endorsed occasional right upper extremity weakness but had no acute findings. CT abdomen  pelvis showed no evidence of any acute diverticulitis or urethral obstructions.  Hospital course complicated intermittent abdominal pain worse in epigastric region.  Lipase trending up from 64 on 05/19/19 to 103 on 05/22/2019.  Last endoscopy and colonoscopy done at [facility] were unrevealing.  Completed 3 days of IV antibiotics cefepime and IV Flagyl empirically.  Afebrile with no leukocytosis.  05/25/19: Patient was seen and examined at her bedside this morning.  No acute events overnight.  Her abdominal pain is mildly  improved.  Post EGD done on 05/25/19 with findings as stated below. - No gross mucosal lesions in esophagus. Tortuous esophagus. Z-line irregular, 36 cm from the incisors. 1 cm hiatal hernia. - Submucosal lesion in the cardia, previously tunnel biopsied and EUS performed by partner and present on CT Imaging without significant change for >13-years. - Two submucosal papules (nodules) found in the stomach. Tunnel biopsied the larger of these lesions.. - Erythematous mucosa in the stomach. Biopsied for HP. - No gross lesions in the duodenal bulb, in the first portion of the duodenum and in the second portion of the duodenum. Biopsied for enteropathy rule out  05/26/19: Patient was seen and examined at her bedside this morning.  She denies any abdominal pain at the time of this visit.  No new complaints.  Vital signs and labs reviewed and are stable.  Patient is hemodynamically stable and appropriate for discharge to home.  She declined home health services.    Hospital Course:  Active Problems:   Rheumatoid arthritis ([place])   Sepsis ([place])  Resolved SIRS unclear etiology, possible intra-abdominal process Presented with lactic acidosis, tachycardia, positive urine analysis. CT abdomen pelvis unrevealing  Urine analysis with pyuria and negative urine culture Blood cultures x2 peripherally negative to date.   Completed 3 days of IV cefepime empirically, started on  05/19/2019, ending 05/22/2019. Post EGD 05/25/19 GI recommendations for p.o. Protonix 40 mg twice daily and Carafate 4 times daily. Biopsies taken, follow results  Intractable abdominal pain post EGD on 05/25/2019 Findings as stated above GI recommendations forp.o. Protonix 40 mg twice daily and Carafate 4 times daily. Biopsies taken, follow results Surgical pathology in process on 05/25/2019 AM  Essential hypertension BP at goal Hold home antihypertensives to avoid hypotension Follow-up with PCP  Elevated lipase, trending down No sign of acute pancreatitis on CT abdomen and pelvis done on admission Lipase levels 103 on 05/22/2019>> 111>> 89 on 05/24/19 Denies history of alcohol use. Last LDL 243 on 08/13/18. Follow up with GI  Resolved AKI on CKD 3, likely secondary to poor renal perfusion Renal function continues to improve Presented with creatinine of 4.91 with GFR of 9 Creatinine 0.8 with GFR greater than 60 on 05/25/2019 Continue to avoid nephrotoxins such as NSAIDs  Hypocalcemia Calcium 7.3 on third on 05/25/2019 Albumin 2.7 Corrected calcium 8.3 Repleted with IV calcium gluconate 1 g once. Follow-up with your PCP  Rheumatoid arthritis C/w p.o. Medrol 4 mg daily and tramadol as needed C/w plaquenil Follow-up with rheumatology  Generalized weakness/physical debility PT OT to assess OT recommended home health OT with 24 supervision and assistance. Continue fall precaution Patient declined home health services  Resolved high anion gap metabolic acidosis Presented with chemistry bicarb of 15, anion gap of 18 and AKI Completed isotonic bicarb at 75 cc/h Chemistry bicarb 24 and anion gap of 12 on 05/22/2019  Resolved post repletion: Hypokalemia Potassium 3.0>> 3.9 on 05/22/2019. Repleted with p.o. KCl 40 mEq x 2.  Pyuria Urine culture negative History of Klebsiella pneumonia and E. coli UTI in 2018 Completed 3 days of IV cefepime  empirically.  Resolved intractable nausea frequent stool Continue IV antiemetics as needed Recently MiraLAX  Chronic diastolic CHF Appears euvolemic on exam Last 2D echo done on 09/07/2018 showed LVEF 60-65%  Type 2 diabetes Well controlled Last A1c 8.2 on August 13 th 2020 Hemoglobin A1c 7.9 on 05/20/2019 Resume home medication and follow-up with your PCP    Code Status: Full code    Consultants:  GI  Procedures:  EGD on 05/25/2019.    Discharge Exam: BP 132/79 (BP Location: Right Arm)   Pulse 89   Temp 98.5 F (36.9 C) (Oral)   Resp 18   Ht 5\' 2"  (1.575 m)   Wt 62.9 kg   SpO2 95%   BMI 25.35 kg/m  . General: 83 y.o. year-old female well developed well nourished in no acute distress.  Alert and oriented x3. . Cardiovascular: Regular rate and rhythm with no rubs or gallops.  No thyromegaly or JVD noted.   Marland Kitchen Respiratory: Clear to auscultation with no wheezes or rales. Good inspiratory effort. . Abdomen: Soft nontender nondistended with normal bowel sounds x4 quadrants. . Musculoskeletal: No lower extremity edema. 2/4 pulses in all 4 extremities. Marland Kitchen Psychiatry: Mood is appropriate for condition and setting  Discharge Instructions You were cared for by a hospitalist during your hospital stay. If you have any questions about your discharge medications or the care you received while you were in the hospital after you are discharged, you can call the unit and asked to speak with the hospitalist on call if the hospitalist that took care of you is not  available. Once you are discharged, your primary care physician will handle any further medical issues. Please note that NO REFILLS for any discharge medications will be authorized once you are discharged, as it is imperative that you return to your primary care physician (or establish a relationship with a primary care physician if you do not have one) for your aftercare needs so that they can reassess your need for  medications and monitor your lab values.   Allergies as of 05/26/2019   No Known Allergies     Medication List    STOP taking these medications   budesonide-formoterol 160-4.5 MCG/ACT inhaler Commonly known as: SYMBICORT   ibuprofen 800 MG tablet Commonly known as: ADVIL   lisinopril-hydrochlorothiazide 10-12.5 MG tablet Commonly known as: ZESTORETIC   omeprazole 20 MG capsule Commonly known as: PRILOSEC Replaced by: pantoprazole 40 MG tablet     TAKE these medications   albuterol 0.63 MG/3ML nebulizer solution Commonly known as: ACCUNEB Take 2 ampules by nebulization every 6 (six) hours as needed for wheezing.   aspirin EC 81 MG tablet Take 1 tablet (81 mg total) by mouth daily. What changed: when to take this   atorvastatin 10 MG tablet Commonly known as: Lipitor One qd x 1 month, then 2 qd if tolerated What changed:   how much to take  how to take this  when to take this  additional instructions   dicyclomine 20 MG tablet Commonly known as: BENTYL Take 20 mg by mouth 2 (two) times daily.   Gas-X Extra Strength 125 MG chewable tablet Generic drug: simethicone Chew 125 mg by mouth every 6 (six) hours as needed for flatulence.   hydroxychloroquine 200 MG tablet Commonly known as: PLAQUENIL Take 200 mg by mouth 2 (two) times daily.   hydrOXYzine 10 MG tablet Commonly known as: ATARAX/VISTARIL 1 q 8h prn itching What changed:   how much to take  how to take this  when to take this  reasons to take this  additional instructions   methylPREDNISolone 4 MG tablet Commonly known as: MEDROL Take 4 mg by mouth daily.   OneTouch Verio test strip Generic drug: glucose blood Use to check blood sugar 3 to 4 times a day before meals   pantoprazole 40 MG tablet Commonly known as: PROTONIX Take 1 tablet (40 mg total) by mouth 2 (two) times daily. Take 1 tablet, 40 mg, by mouth 2 times daily x6 weeks then 1 tablet, 40 mg once daily indefinitely Start  taking on: July 06, 2019 Replaces: omeprazole 20 MG capsule   Rybelsus 7 MG Tabs Generic drug: Semaglutide Take 1 tablet by mouth daily for 30 days, THEN 1 tablet daily. X 1 month, then 2 qd. Start taking on: May 06, 2019   sucralfate 1 GM/10ML suspension Commonly known as: CARAFATE Take 10 mLs (1 g total) by mouth 4 (four) times daily -  with meals and at bedtime.   traMADol 50 MG tablet Commonly known as: ULTRAM Take 1 tablet (50 mg total) by mouth every 12 (twelve) hours as needed for moderate pain.      No Known Allergies    The results of significant diagnostics from this hospitalization (including imaging, microbiology, ancillary and laboratory) are listed below for reference.    Significant Diagnostic Studies: Ct Abdomen Pelvis Wo Contrast  Result Date: 05/19/2019 CLINICAL DATA:  Pt presents with abd pain, N/V/D x2 daysAbd pain, diverticulitis suspected EXAM: CT ABDOMEN AND PELVIS WITHOUT CONTRAST TECHNIQUE: Multidetector CT imaging of the  abdomen and pelvis was performed following the standard protocol without IV contrast. COMPARISON:  CT 06/28/2018 FINDINGS: Lower chest: Mild linear atelectasis at the RIGHT lung base. Hepatobiliary: No focal hepatic lesion. Postcholecystectomy. No biliary dilatation. Pancreas: Pancreas is normal. No ductal dilatation. No pancreatic inflammation. Spleen: Normal spleen Adrenals/urinary tract: Adrenal glands and kidneys are normal. The ureters and bladder normal. Stomach/Bowel: Stomach, small-bowel and cecum normal. The ascending and transverse colon normal. Descending colon normal. No diverticulitis. Fluid in the rectum. Vascular/Lymphatic: Abdominal aorta is normal caliber with atherosclerotic calcification. There is no retroperitoneal or periportal lymphadenopathy. No pelvic lymphadenopathy. Reproductive: Post hysterectomy. Other: No free fluid. Musculoskeletal: Degenerative osteophytosis of the spine. Multiple levels of degenerative change  spine. Augmentation lower thoracic spine. IMPRESSION: 1. No evidence of acute diverticulitis. 2. Fluid within the rectum. 3. No ureteral obstruction. Aortic Atherosclerosis (ICD10-I70.0). Electronically Signed   By: Suzy Bouchard M.D.   On: 05/19/2019 21:04   Ct Head Wo Contrast  Result Date: 05/19/2019 CLINICAL DATA:  Right upper extremity weakness EXAM: CT HEAD WITHOUT CONTRAST TECHNIQUE: Contiguous axial images were obtained from the base of the skull through the vertex without intravenous contrast. COMPARISON:  CT brain 05/18/2017 FINDINGS: Brain: No acute territorial infarction, hemorrhage, or intracranial mass. Atrophy and mild small vessel ischemic changes of the white matter. Stable ventricle size. Vascular: No hyperdense vessels.  Carotid vascular calcification Skull: Normal. Negative for fracture or focal lesion. Sinuses/Orbits: No acute finding. Old fracture medial wall left orbit. Other: None IMPRESSION: 1. No CT evidence for acute intracranial abnormality. 2. Atrophy and small vessel ischemic changes of the white matter Electronically Signed   By: Donavan Foil M.D.   On: 05/19/2019 21:01   Dg Chest Port 1 View  Result Date: 05/19/2019 CLINICAL DATA:  Chest pain EXAM: PORTABLE CHEST 1 VIEW COMPARISON:  Chest radiograph dated 06/08/2018. FINDINGS: The heart size and mediastinal contours are within normal limits. Both lungs are clear. Vertebroplasty changes are seen in the lower thoracic spine. Degenerative changes are seen in the shoulders. IMPRESSION: No active cardiopulmonary disease. Electronically Signed   By: Zerita Boers M.D.   On: 05/19/2019 19:48    Microbiology: Recent Results (from the past 240 hour(s))  Blood Culture (routine x 2)     Status: None   Collection Time: 05/19/19  8:20 PM   Specimen: BLOOD  Result Value Ref Range Status   Specimen Description BLOOD LEFT ANTECUBITAL  Final   Special Requests   Final    BOTTLES DRAWN AEROBIC AND ANAEROBIC Blood Culture  results may not be optimal due to an inadequate volume of blood received in culture bottles   Culture   Final    NO GROWTH 5 DAYS Performed at [facility], [place] [address] [place], [place] [address]    Report Status 05/24/2019 FINAL  Final  Blood Culture (routine x 2)     Status: None   Collection Time: 05/19/19  8:20 PM   Specimen: BLOOD LEFT FOREARM  Result Value Ref Range Status   Specimen Description BLOOD LEFT FOREARM  Final   Special Requests   Final    BOTTLES DRAWN AEROBIC AND ANAEROBIC Blood Culture results may not be optimal due to an inadequate volume of blood received in culture bottles   Culture   Final    NO GROWTH 5 DAYS Performed at [facility], [place] [address], [place], [place] [address]    Report Status 05/24/2019 FINAL  Final  SARS CORONAVIRUS 2 (TAT  6-24 HRS) Nasopharyngeal Nasopharyngeal Swab     Status: None   Collection Time: 05/19/19 10:21 PM   Specimen: Nasopharyngeal Swab  Result Value Ref Range Status   SARS Coronavirus 2 NEGATIVE NEGATIVE Final    Comment: (NOTE) SARS-CoV-2 target nucleic acids are NOT DETECTED. The SARS-CoV-2 RNA is generally detectable in upper and lower respiratory specimens during the acute phase of infection. Negative results do not preclude SARS-CoV-2 infection, do not rule out co-infections with other pathogens, and should not be used as the sole basis for treatment or other patient management decisions. Negative results must be combined with clinical observations, patient history, and epidemiological information. The expected result is Negative. Fact Sheet for Patients: SugarRoll.be Fact Sheet for Healthcare Providers: https://www.woods-mathews.com/ This test is not yet approved or cleared by the [place] FDA and  has been authorized for detection and/or diagnosis of SARS-CoV-2 by FDA under an Emergency Use Authorization (EUA). This EUA will remain  in effect (meaning  this test can be used) for the duration of the COVID-19 declaration under Section 56 4(b)(1) of the Act, 21 U.S.C. section 360bbb-3(b)(1), unless the authorization is terminated or revoked sooner. Performed at [facility], [place] [address], [place], [place] [address]   Urine culture     Status: Abnormal   Collection Time: 05/20/19  2:28 AM   Specimen: In/Out Cath Urine  Result Value Ref Range Status   Specimen Description IN/OUT CATH URINE  Final   Special Requests NONE  Final   Culture (A)  Final    <10,000 COLONIES/mL INSIGNIFICANT GROWTH Performed at [facility], [place] [address], [place], [place] [address]    Report Status 05/21/2019 FINAL  Final     Labs: Basic Metabolic Panel: Recent Labs  Lab 05/21/19 0552 05/22/19 0357 05/23/19 0537 05/24/19 0527 05/25/19 0746  NA 139 138 139 141 139  K 3.0* 3.9 3.7 3.4* 3.6  CL 100 102 102 107 109  CO2 26 24 27 24  21*  GLUCOSE 121* 110* 107* 82 75  BUN 30* 24* 9 <5* <5*  CREATININE 1.83* 1.38* 1.07* 0.82 0.80  CALCIUM 8.2* 7.9* 7.9* 7.5* 7.3*   Liver Function Tests: Recent Labs  Lab 05/19/19 1926 05/20/19 0248 05/23/19 0537  AST 44* 27 38  ALT 25 18 19   ALKPHOS 52 40 33*  BILITOT 1.9* 0.6 0.5  PROT 7.3 5.7* 5.5*  ALBUMIN 3.7 2.9* 2.7*   Recent Labs  Lab 05/19/19 1926 05/22/19 0357 05/23/19 0537 05/24/19 0527  LIPASE 64* 103* 111* 89*   No results for input(s): AMMONIA in the last 168 hours. CBC: Recent Labs  Lab 05/19/19 1926 05/20/19 0500  WBC 10.2 9.8  NEUTROABS 6.5  --   HGB 14.6 12.0  HCT 44.9 37.2  MCV 92.0 93.7  PLT 375 216   Cardiac Enzymes: No results for input(s): CKTOTAL, CKMB, CKMBINDEX, TROPONINI in the last 168 hours. BNP: BNP (last 3 results) No results for input(s): BNP in the last 8760 hours.  ProBNP (last 3 results) Recent Labs    06/08/18 1158  PROBNP 98    CBG: Recent Labs  Lab 05/25/19 0716 05/25/19 1137 05/25/19 1640 05/25/19 2114 05/26/19 0729   GLUCAP 75 77 112* 89 85       Signed:  Kayleen Memos, MD [facility] 05/26/2019, 10:29 AM

## 2019-05-27 ENCOUNTER — Telehealth: Payer: Self-pay

## 2019-05-27 ENCOUNTER — Encounter: Payer: Self-pay | Admitting: Gastroenterology

## 2019-05-27 NOTE — Telephone Encounter (Signed)
This nurse spoke with Ms. Inwood to follow up about hospital visit. She then directed me to call her niece Bethena Roys to set up the actual follow up appointment.   Transition Care Management Follow-up Telephone Call  Date of discharge and from where: 05/26/2019 from [facility]  How have you been since you were released from the hospital? Had some nausea  Any questions or concerns? Yes   Items Reviewed:  Did the pt receive and understand the discharge instructions provided? Yes   Medications obtained and verified? Yes   Any new allergies since your discharge? No   Dietary orders reviewed? Yes  Do you have support at home? Yes   Other (ie: DME, Home Health, etc) n/a  Functional Questionnaire: (I = Independent and D = Dependent) ADL's: I  Bathing/Dressing- I   Meal Prep- I  Eating- I  Maintaining continence- I  Transferring/Ambulation- I  Managing Meds- D; niece sets them up   Follow up appointments reviewed:    PCP Hospital f/u appt confirmed? Yes  Scheduled to see Audery Amel on 06/03/2019 @ 4:00..  Are transportation arrangements needed? No   If their condition worsens, is the pt aware to call  their PCP or go to the ED? Yes  Was the patient provided with contact information for the PCP's office or ED? Yes  Was the pt encouraged to call back with questions or concerns? Yes

## 2019-05-27 NOTE — Telephone Encounter (Signed)
Left message for niece Bethena Roys) to please call back to schedule office visit in 1 month with Dr. Havery Moros for patient (per pt. Request)

## 2019-05-27 NOTE — Telephone Encounter (Signed)
-----   Message from Yetta Flock, MD sent at 05/26/2019  7:27 AM EDT ----- Regarding: RE: Follow-up Thanks Gabe, I can follow her up in the clinic.  Sherlynn Stalls can you please coordinate outpatient follow up for this patient in the next month or so? Thanks ----- Message ----- From: Irving Copas., MD Sent: 05/26/2019   4:19 AM EDT To: Alfredia Ferguson, PA-C, Vena Rua, PA-C, # Subject: Follow-up                                      Richardson Landry and Amy,Hope you are both well.Patient was admitted recently and asked to reevaluate because of some chronic issues of postprandial bloating and discomfort that have been ongoing.  I wonder whether she could have bacterial overgrowth or potentially some exocrine insufficiency.  Went ahead and repeated an upper endoscopy which was grossly unremarkable but biopsies are pending.  We will be signing off and hopefully she will be able to go home soon.She will benefit from an outpatient follow-up with you all in the next 3 to 4 weeks.Patty, can you work with their schedules to see if they can see her in that time.Thanks.GM

## 2019-05-29 ENCOUNTER — Observation Stay (HOSPITAL_COMMUNITY)
Admission: EM | Admit: 2019-05-29 | Discharge: 2019-05-31 | Disposition: A | Discharge status: Home Health | Payer: Medicare Other | Attending: Family Medicine | Admitting: Family Medicine

## 2019-05-29 ENCOUNTER — Encounter (HOSPITAL_COMMUNITY): Payer: Self-pay | Admitting: Emergency Medicine

## 2019-05-29 ENCOUNTER — Other Ambulatory Visit: Payer: Self-pay

## 2019-05-29 DIAGNOSIS — M069 Rheumatoid arthritis, unspecified: Secondary | ICD-10-CM | POA: Diagnosis not present

## 2019-05-29 DIAGNOSIS — I110 Hypertensive heart disease with heart failure: Secondary | ICD-10-CM | POA: Insufficient documentation

## 2019-05-29 DIAGNOSIS — Z7982 Long term (current) use of aspirin: Secondary | ICD-10-CM | POA: Diagnosis not present

## 2019-05-29 DIAGNOSIS — Z87891 Personal history of nicotine dependence: Secondary | ICD-10-CM | POA: Diagnosis not present

## 2019-05-29 DIAGNOSIS — I5032 Chronic diastolic (congestive) heart failure: Secondary | ICD-10-CM | POA: Insufficient documentation

## 2019-05-29 DIAGNOSIS — R112 Nausea with vomiting, unspecified: Secondary | ICD-10-CM

## 2019-05-29 DIAGNOSIS — E7800 Pure hypercholesterolemia, unspecified: Secondary | ICD-10-CM | POA: Diagnosis not present

## 2019-05-29 DIAGNOSIS — E785 Hyperlipidemia, unspecified: Secondary | ICD-10-CM | POA: Diagnosis not present

## 2019-05-29 DIAGNOSIS — D84821 Immunodeficiency due to drugs: Principal | ICD-10-CM | POA: Diagnosis present

## 2019-05-29 DIAGNOSIS — T380X5A Adverse effect of glucocorticoids and synthetic analogues, initial encounter: Secondary | ICD-10-CM | POA: Diagnosis not present

## 2019-05-29 DIAGNOSIS — R748 Abnormal levels of other serum enzymes: Secondary | ICD-10-CM | POA: Diagnosis not present

## 2019-05-29 DIAGNOSIS — K219 Gastro-esophageal reflux disease without esophagitis: Secondary | ICD-10-CM | POA: Diagnosis not present

## 2019-05-29 DIAGNOSIS — F419 Anxiety disorder, unspecified: Secondary | ICD-10-CM | POA: Insufficient documentation

## 2019-05-29 DIAGNOSIS — K2950 Unspecified chronic gastritis without bleeding: Secondary | ICD-10-CM | POA: Diagnosis not present

## 2019-05-29 DIAGNOSIS — E119 Type 2 diabetes mellitus without complications: Secondary | ICD-10-CM | POA: Diagnosis not present

## 2019-05-29 DIAGNOSIS — J449 Chronic obstructive pulmonary disease, unspecified: Secondary | ICD-10-CM | POA: Insufficient documentation

## 2019-05-29 DIAGNOSIS — Z7952 Long term (current) use of systemic steroids: Secondary | ICD-10-CM | POA: Diagnosis not present

## 2019-05-29 DIAGNOSIS — R6510 Systemic inflammatory response syndrome (SIRS) of non-infectious origin without acute organ dysfunction: Secondary | ICD-10-CM | POA: Diagnosis not present

## 2019-05-29 DIAGNOSIS — E876 Hypokalemia: Secondary | ICD-10-CM | POA: Diagnosis present

## 2019-05-29 DIAGNOSIS — R296 Repeated falls: Secondary | ICD-10-CM | POA: Insufficient documentation

## 2019-05-29 DIAGNOSIS — E860 Dehydration: Secondary | ICD-10-CM

## 2019-05-29 DIAGNOSIS — K2900 Acute gastritis without bleeding: Secondary | ICD-10-CM

## 2019-05-29 DIAGNOSIS — Z20828 Contact with and (suspected) exposure to other viral communicable diseases: Secondary | ICD-10-CM | POA: Insufficient documentation

## 2019-05-29 DIAGNOSIS — K2970 Gastritis, unspecified, without bleeding: Secondary | ICD-10-CM | POA: Diagnosis present

## 2019-05-29 DIAGNOSIS — Z79899 Other long term (current) drug therapy: Secondary | ICD-10-CM | POA: Insufficient documentation

## 2019-05-29 DIAGNOSIS — R197 Diarrhea, unspecified: Secondary | ICD-10-CM | POA: Insufficient documentation

## 2019-05-29 DIAGNOSIS — M05741 Rheumatoid arthritis with rheumatoid factor of right hand without organ or systems involvement: Secondary | ICD-10-CM

## 2019-05-29 DIAGNOSIS — G4733 Obstructive sleep apnea (adult) (pediatric): Secondary | ICD-10-CM | POA: Insufficient documentation

## 2019-05-29 DIAGNOSIS — T148XXA Other injury of unspecified body region, initial encounter: Secondary | ICD-10-CM

## 2019-05-29 DIAGNOSIS — M05742 Rheumatoid arthritis with rheumatoid factor of left hand without organ or systems involvement: Secondary | ICD-10-CM

## 2019-05-29 NOTE — ED Triage Notes (Signed)
Patient arrived with EMS from home reports persistent emesis with diarrhea onset this week , denies fever or chills , EGD / Biopsy last 05/25/19.

## 2019-05-30 ENCOUNTER — Emergency Department (HOSPITAL_COMMUNITY): Payer: Medicare Other

## 2019-05-30 DIAGNOSIS — R197 Diarrhea, unspecified: Secondary | ICD-10-CM

## 2019-05-30 DIAGNOSIS — M05741 Rheumatoid arthritis with rheumatoid factor of right hand without organ or systems involvement: Secondary | ICD-10-CM

## 2019-05-30 DIAGNOSIS — R748 Abnormal levels of other serum enzymes: Secondary | ICD-10-CM | POA: Diagnosis present

## 2019-05-30 DIAGNOSIS — K2900 Acute gastritis without bleeding: Secondary | ICD-10-CM

## 2019-05-30 DIAGNOSIS — E876 Hypokalemia: Secondary | ICD-10-CM

## 2019-05-30 DIAGNOSIS — M05742 Rheumatoid arthritis with rheumatoid factor of left hand without organ or systems involvement: Secondary | ICD-10-CM

## 2019-05-30 DIAGNOSIS — K2970 Gastritis, unspecified, without bleeding: Secondary | ICD-10-CM | POA: Diagnosis present

## 2019-05-30 DIAGNOSIS — R112 Nausea with vomiting, unspecified: Secondary | ICD-10-CM | POA: Diagnosis present

## 2019-05-30 DIAGNOSIS — D84821 Immunodeficiency due to drugs: Secondary | ICD-10-CM | POA: Diagnosis not present

## 2019-05-30 DIAGNOSIS — Z7952 Long term (current) use of systemic steroids: Secondary | ICD-10-CM

## 2019-05-30 DIAGNOSIS — R6510 Systemic inflammatory response syndrome (SIRS) of non-infectious origin without acute organ dysfunction: Secondary | ICD-10-CM | POA: Diagnosis present

## 2019-05-30 LAB — COMPREHENSIVE METABOLIC PANEL
ALT: 27 U/L (ref 0–44)
AST: 37 U/L (ref 15–41)
Albumin: 3.4 g/dL — ABNORMAL LOW (ref 3.5–5.0)
Alkaline Phosphatase: 37 U/L — ABNORMAL LOW (ref 38–126)
Anion gap: 16 — ABNORMAL HIGH (ref 5–15)
BUN: <5 mg/dL — ABNORMAL LOW (ref 8–23)
CO2: 21 mmol/L — ABNORMAL LOW (ref 22–32)
Calcium: 8.5 mg/dL — ABNORMAL LOW (ref 8.9–10.3)
Chloride: 99 mmol/L (ref 98–111)
Creatinine, Ser: 0.98 mg/dL (ref 0.44–1.00)
GFR calc Af Amer: >60 mL/min (ref >60)
GFR calc non Af Amer: 54 mL/min — ABNORMAL LOW (ref >60)
Glucose, Bld: 132 mg/dL — ABNORMAL HIGH (ref 70–99)
Potassium: 2.7 mmol/L — CL (ref 3.5–5.1)
Sodium: 136 mmol/L (ref 135–145)
Total Bilirubin: 0.9 mg/dL (ref 0.3–1.2)
Total Protein: 6.9 g/dL (ref 6.5–8.1)

## 2019-05-30 LAB — LIPID PANEL
Cholesterol: 123 mg/dL (ref 0–200)
HDL: 49 mg/dL (ref >40)
LDL Cholesterol: 58 mg/dL (ref 0–99)
Total CHOL/HDL Ratio: 2.5 RATIO
Triglycerides: 82 mg/dL (ref <150)
VLDL: 16 mg/dL (ref 0–40)

## 2019-05-30 LAB — CBC
HCT: 38.5 % (ref 36.0–46.0)
Hemoglobin: 12.8 g/dL (ref 12.0–15.0)
MCH: 30 pg (ref 26.0–34.0)
MCHC: 33.2 g/dL (ref 30.0–36.0)
MCV: 90.2 fL (ref 80.0–100.0)
Platelets: 357 10*3/uL (ref 150–400)
RBC: 4.27 MIL/uL (ref 3.87–5.11)
RDW: 13.6 % (ref 11.5–15.5)
WBC: 12.5 10*3/uL — ABNORMAL HIGH (ref 4.0–10.5)
nRBC: 0 % (ref 0.0–0.2)

## 2019-05-30 LAB — MAGNESIUM
Magnesium: 0.9 mg/dL — CL (ref 1.7–2.4)
Magnesium: 2 mg/dL (ref 1.7–2.4)

## 2019-05-30 LAB — URINALYSIS, ROUTINE W REFLEX MICROSCOPIC
Bilirubin Urine: NEGATIVE
Glucose, UA: NEGATIVE mg/dL
Hgb urine dipstick: NEGATIVE
Ketones, ur: 20 mg/dL — AB
Leukocytes,Ua: NEGATIVE
Nitrite: NEGATIVE
Protein, ur: NEGATIVE mg/dL
Specific Gravity, Urine: 1.008 (ref 1.005–1.030)
pH: 6 (ref 5.0–8.0)

## 2019-05-30 LAB — GLUCOSE, CAPILLARY
Glucose-Capillary: 122 mg/dL — ABNORMAL HIGH (ref 70–99)
Glucose-Capillary: 105 mg/dL — ABNORMAL HIGH (ref 70–99)
Glucose-Capillary: 94 mg/dL (ref 70–99)

## 2019-05-30 LAB — BASIC METABOLIC PANEL
Anion gap: 15 (ref 5–15)
BUN: 6 mg/dL — ABNORMAL LOW (ref 8–23)
CO2: 17 mmol/L — ABNORMAL LOW (ref 22–32)
Calcium: 7.4 mg/dL — ABNORMAL LOW (ref 8.9–10.3)
Chloride: 104 mmol/L (ref 98–111)
Creatinine, Ser: 0.84 mg/dL (ref 0.44–1.00)
GFR calc Af Amer: >60 mL/min (ref >60)
GFR calc non Af Amer: >60 mL/min (ref >60)
Glucose, Bld: 124 mg/dL — ABNORMAL HIGH (ref 70–99)
Potassium: 3.6 mmol/L (ref 3.5–5.1)
Sodium: 136 mmol/L (ref 135–145)

## 2019-05-30 LAB — SEDIMENTATION RATE: Sed Rate: 42 mm/hr — ABNORMAL HIGH (ref 0–22)

## 2019-05-30 LAB — LIPASE, BLOOD: Lipase: 148 U/L — ABNORMAL HIGH (ref 11–51)

## 2019-05-30 LAB — CK: Total CK: 143 U/L (ref 38–234)

## 2019-05-30 LAB — C-REACTIVE PROTEIN: CRP: 11.3 mg/dL — ABNORMAL HIGH (ref <1.0)

## 2019-05-30 LAB — SARS CORONAVIRUS 2 (TAT 6-24 HRS): SARS Coronavirus 2: NEGATIVE

## 2019-05-30 MED ORDER — ACETAMINOPHEN 325 MG PO TABS
650.0000 mg | ORAL_TABLET | Freq: Four times a day (QID) | ORAL | Status: DC | PRN
  Filled 2019-05-30: qty 2

## 2019-05-30 MED ORDER — HYDROXYCHLOROQUINE SULFATE 200 MG PO TABS
200.0000 mg | ORAL_TABLET | Freq: Two times a day (BID) | ORAL | Status: DC
  Administered 2019-05-30 – 2019-05-31 (×3): 200 mg via ORAL
  Filled 2019-05-30 (×3): qty 1

## 2019-05-30 MED ORDER — ATORVASTATIN CALCIUM 10 MG PO TABS
10.0000 mg | ORAL_TABLET | Freq: Every day | ORAL | Status: DC
  Administered 2019-05-30: 10 mg via ORAL
  Filled 2019-05-30: qty 1

## 2019-05-30 MED ORDER — SUCRALFATE 1 GM/10ML PO SUSP
1.0000 g | Freq: Three times a day (TID) | ORAL | Status: DC
  Administered 2019-05-30 (×3): 1 g via ORAL
  Filled 2019-05-30 (×3): qty 10

## 2019-05-30 MED ORDER — PANTOPRAZOLE SODIUM 40 MG PO TBEC
40.0000 mg | DELAYED_RELEASE_TABLET | Freq: Two times a day (BID) | ORAL | Status: DC
  Administered 2019-05-30 – 2019-05-31 (×3): 40 mg via ORAL
  Filled 2019-05-30 (×4): qty 1

## 2019-05-30 MED ORDER — ALBUTEROL SULFATE 0.63 MG/3ML IN NEBU: 2.0000 | INHALATION_SOLUTION | Freq: Four times a day (QID) | RESPIRATORY_TRACT | Status: DC | PRN

## 2019-05-30 MED ORDER — METHYLPREDNISOLONE 4 MG PO TABS
4.0000 mg | ORAL_TABLET | Freq: Every day | ORAL | Status: DC
  Administered 2019-05-30 – 2019-05-31 (×2): 4 mg via ORAL
  Filled 2019-05-30 (×2): qty 1

## 2019-05-30 MED ORDER — TRAMADOL HCL 50 MG PO TABS
50.0000 mg | ORAL_TABLET | Freq: Two times a day (BID) | ORAL | Status: DC | PRN
  Administered 2019-05-30: 50 mg via ORAL
  Filled 2019-05-30: qty 1

## 2019-05-30 MED ORDER — METHYLPREDNISOLONE 4 MG PO TABS: 4.0000 mg | ORAL_TABLET | Freq: Every day | ORAL | Status: DC

## 2019-05-30 MED ORDER — PANTOPRAZOLE SODIUM 40 MG PO TBEC: 40.0000 mg | DELAYED_RELEASE_TABLET | Freq: Every day | ORAL | Status: DC

## 2019-05-30 MED ORDER — MAGNESIUM SULFATE 2 GM/50ML IV SOLN
2.0000 g | Freq: Once | INTRAVENOUS | Status: AC
  Administered 2019-05-30: 2 g via INTRAVENOUS
  Filled 2019-05-30: qty 50

## 2019-05-30 MED ORDER — ACETAMINOPHEN 650 MG RE SUPP: 650.0000 mg | Freq: Four times a day (QID) | RECTAL | Status: DC | PRN

## 2019-05-30 MED ORDER — ENOXAPARIN SODIUM 40 MG/0.4ML ~~LOC~~ SOLN
40.0000 mg | SUBCUTANEOUS | Status: DC
  Administered 2019-05-30 – 2019-05-31 (×2): 40 mg via SUBCUTANEOUS
  Filled 2019-05-30 (×2): qty 0.4

## 2019-05-30 MED ORDER — TRAMADOL HCL 50 MG PO TABS
50.0000 mg | ORAL_TABLET | Freq: Once | ORAL | Status: AC
  Administered 2019-05-30: 50 mg via ORAL
  Filled 2019-05-30: qty 1

## 2019-05-30 MED ORDER — ONDANSETRON HCL 4 MG/2ML IJ SOLN
4.0000 mg | Freq: Four times a day (QID) | INTRAMUSCULAR | Status: DC | PRN
  Administered 2019-05-30: 4 mg via INTRAVENOUS
  Filled 2019-05-30: qty 2

## 2019-05-30 MED ORDER — ALBUTEROL SULFATE (2.5 MG/3ML) 0.083% IN NEBU: 2.5000 mg | INHALATION_SOLUTION | Freq: Four times a day (QID) | RESPIRATORY_TRACT | Status: DC | PRN

## 2019-05-30 MED ORDER — HYDROCODONE-ACETAMINOPHEN 5-325 MG PO TABS
1.0000 | ORAL_TABLET | Freq: Four times a day (QID) | ORAL | Status: DC | PRN
  Administered 2019-05-30 – 2019-05-31 (×4): 1 via ORAL
  Filled 2019-05-30 (×4): qty 1

## 2019-05-30 MED ORDER — ONDANSETRON HCL 4 MG PO TABS: 4.0000 mg | ORAL_TABLET | Freq: Four times a day (QID) | ORAL | Status: DC | PRN

## 2019-05-30 MED ORDER — FENTANYL CITRATE (PF) 100 MCG/2ML IJ SOLN
100.0000 ug | Freq: Once | INTRAMUSCULAR | Status: AC
  Administered 2019-05-30: 100 ug via INTRAVENOUS
  Filled 2019-05-30: qty 2

## 2019-05-30 MED ORDER — INSULIN ASPART 100 UNIT/ML ~~LOC~~ SOLN: 0.0000 [IU] | Freq: Every day | SUBCUTANEOUS | Status: DC

## 2019-05-30 MED ORDER — SODIUM CHLORIDE 0.9 % IV SOLN
Freq: Once | INTRAVENOUS | Status: AC
  Administered 2019-05-30: 11:00:00 via INTRAVENOUS

## 2019-05-30 MED ORDER — INSULIN ASPART 100 UNIT/ML ~~LOC~~ SOLN: 0.0000 [IU] | Freq: Three times a day (TID) | SUBCUTANEOUS | Status: DC

## 2019-05-30 MED ORDER — ONDANSETRON HCL 4 MG/2ML IJ SOLN
4.0000 mg | Freq: Once | INTRAMUSCULAR | Status: AC
  Administered 2019-05-30: 4 mg via INTRAVENOUS
  Filled 2019-05-30: qty 2

## 2019-05-30 MED ORDER — POTASSIUM CHLORIDE 10 MEQ/100ML IV SOLN
10.0000 meq | Freq: Once | INTRAVENOUS | Status: AC
  Administered 2019-05-30: 10 meq via INTRAVENOUS
  Filled 2019-05-30: qty 100

## 2019-05-30 MED ORDER — SODIUM CHLORIDE 0.9 % IV BOLUS (SEPSIS)
1000.0000 mL | Freq: Once | INTRAVENOUS | Status: AC
  Administered 2019-05-30: 1000 mL via INTRAVENOUS

## 2019-05-30 MED ORDER — SODIUM CHLORIDE 0.9% FLUSH
3.0000 mL | Freq: Once | INTRAVENOUS | Status: AC
  Administered 2019-05-30: 3 mL via INTRAVENOUS

## 2019-05-30 MED ORDER — POTASSIUM CHLORIDE CRYS ER 20 MEQ PO TBCR
40.0000 meq | EXTENDED_RELEASE_TABLET | Freq: Once | ORAL | Status: AC
  Administered 2019-05-30: 40 meq via ORAL
  Filled 2019-05-30: qty 2

## 2019-05-30 MED ORDER — HYDROXYZINE HCL 10 MG PO TABS
10.0000 mg | ORAL_TABLET | Freq: Three times a day (TID) | ORAL | Status: DC | PRN
  Administered 2019-05-30 – 2019-05-31 (×2): 10 mg via ORAL
  Filled 2019-05-30 (×4): qty 1

## 2019-05-30 MED ORDER — PANTOPRAZOLE SODIUM 40 MG PO TBEC: 40.0000 mg | DELAYED_RELEASE_TABLET | Freq: Two times a day (BID) | ORAL | Status: DC

## 2019-05-30 NOTE — ED Provider Notes (Signed)
[facility] EMERGENCY DEPARTMENT Provider Note   CSN: [id number] Arrival date & time: 05/29/19  2344     History   Chief Complaint Chief Complaint  Patient presents with  . Emesis  . Diarrhea    HPI [name] is a 83 y.o. female.     The history is provided by the patient.  Emesis Severity:  Moderate Progression:  Worsening Chronicity:  Recurrent Relieved by:  Nothing Worsened by:  Nothing Associated symptoms: arthralgias and diarrhea   Associated symptoms: no abdominal pain and no fever   Diarrhea Associated symptoms: arthralgias and vomiting   Associated symptoms: no abdominal pain and no fever   Patient with history of diastolic heart failure, diabetes presents with continuation of vomiting and diarrhea Patient reports she was in the hospital recently with a full evaluation for vomiting.  She was discharged but the vomiting continues.  She also reports some neck and shoulder pain.  No fevers.  No significant chest or abdominal pain.  No trauma.  She reports the vomit and stool are both nonbloody. Past Medical History:  Diagnosis Date  . Acute diastolic heart failure ([place]) 06/15/2018  . Acute kidney injury ([place]) 01/22/2015  . Acute renal failure ([place])   . Acute respiratory failure with hypoxia ([place]) 06/14/2015  . Allergy   . Anemia    "when I was young"  . Anginal pain ([place])   . Anxiety   . Arthritis    "all over my body"   . Chest pain 04/07/2014  . Chronic bronchitis ([place])    "get it just about q yr" (06/14/2015)  . Chronic diastolic CHF (congestive heart failure) ([place])   . Chronic diastolic heart failure, NYHA class 1 ([place]) 01/22/2015  . Chronic lower back pain   . Chronic steroid use 12/11/2012  . Constipation 01/09/2017  . COPD (chronic obstructive pulmonary disease) ([place]) 01/09/2017  . Dehydration, moderate 12/11/2012  . 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 DM (diabetes mellitus) ([place]) 04/08/2014  . Dyspnea 08/10/2018  . Dysrhythmia     patient unsure what it is but has been told that.  . E-coli UTI 01/09/2017  . 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 Elevated lactic acid level 01/22/2015  . Essential hypertension 06/15/2018  . Exertional shortness of breath   . Frequent UTI    "recently" (06/14/2015)  . Gastric mass   . GERD (gastroesophageal reflux disease)   . Headache(784.0)    "weekly; wake up w/them " (110/19/2018)  . Heart murmur   . High cholesterol   . History of blood transfusion ~ 1954   "related to my periods"  . Hyperglycemia 12/11/2012  . Hypertension   . Intractable nausea and vomiting 05/15/2017  . Migraine    "once/month maybe" (05/16/2017)  . Multiple falls 06/14/2015  . Noncompliance 12/11/2012  . Orthostasis 06/14/2015  . OSA (obstructive sleep apnea)    "I think I'm using a BiPAP" (06/14/2015)  . Sepsis ([place]) 01/09/2017  . Sepsis secondary to UTI ([place])   . Syncope and collapse 12/11/2012  . Tachycardia with heart rate 100-120 beats per minute 04/08/2014  . Type II diabetes mellitus ([place])   . Uncontrolled type 2 diabetes mellitus with complication ([place])   . UTI (lower urinary tract infection) 12/11/2012  . Weight loss 06/14/2015    Patient Active Problem List   Diagnosis Date Noted  . History of COPD   . Other chronic pain 04/22/2019  . Dyspnea 08/10/2018  . Acute diastolic heart failure ([place]) 06/15/2018  .  Essential hypertension 06/15/2018  . 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 Gastric mass   . Sepsis secondary to UTI ([place])   . Uncontrolled type 2 diabetes mellitus with complication ([place])   . Acute renal failure ([place])   . Chronic diastolic CHF (congestive heart failure) ([place])   . E-coli UTI 01/09/2017  . Sepsis ([place]) 01/09/2017  . Constipation 01/09/2017  . COPD (chronic obstructive pulmonary disease) ([place]) 01/09/2017  . Multiple falls 06/14/2015  . Weight loss 06/14/2015  . Acute respiratory failure with hypoxia ([place]) 06/14/2015  . Orthostasis 06/14/2015  . Acute respiratory failure  ([place]) 06/14/2015  . Elevated lactic acid level 01/22/2015  . 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 Chronic diastolic heart failure, NYHA class 1 ([place]) 01/22/2015  . Tachycardia with heart rate 100-120 beats per minute 04/08/2014  . DM (diabetes mellitus) ([place]) 04/08/2014  . Chest pain 04/07/2014  . Hyperglycemia 12/11/2012  . Syncope and collapse 12/11/2012  . Dehydration, moderate 12/11/2012  . Noncompliance 12/11/2012  . UTI (lower urinary tract infection) 12/11/2012  . Chronic steroid use 12/11/2012  . Rheumatoid arthritis ([place]) 12/11/2012    Past Surgical History:  Procedure Laterality Date  . APPENDECTOMY  1970   Archie Endo 12/12/2010  . BACK SURGERY    . BIOPSY  05/25/2019   Procedure: BIOPSY;  Surgeon: Rush Landmark Telford Nab., MD;  Location: [facility];  Service: Gastroenterology;;  . [place]   Archie Endo 12/12/2010  . ESOPHAGOGASTRODUODENOSCOPY N/A 05/25/2019   Procedure: ESOPHAGOGASTRODUODENOSCOPY (EGD);  Surgeon: Irving Copas., MD;  Location: [facility];  Service: Gastroenterology;  Laterality: N/A;  . EUS N/A 10/16/2017   Procedure: UPPER ENDOSCOPIC ULTRASOUND (EUS) RADIAL;  Surgeon: Milus Banister, MD;  Location: [facility];  Service: Endoscopy;  Laterality: N/A;  . FRACTURE SURGERY    . LAPAROSCOPIC CHOLECYSTECTOMY  1990's  . LUMBAR MICRODISCECTOMY Left 09/2006   L5-S1/notes 12/12/2010  . MULTIPLE EXTRACTIONS WITH ALVEOLOPLASTY Bilateral 06/13/2017   Procedure: MULTIPLE EXTRACTION WITH ALVEOLOPLASTY;  Surgeon: Diona Browner, DDS;  Location: [facility];  Service: Oral Surgery;  Laterality: Bilateral;  . MULTIPLE TOOTH EXTRACTIONS    . ORIF SHOULDER FRACTURE Left ~ 2009   "put cement down in it"   . TONSILLECTOMY  1950's?  . TOTAL ABDOMINAL HYSTERECTOMY  1979   Archie Endo 12/12/2010     OB History   No obstetric history on file.      Home Medications    Prior to Admission medications   Medication Sig Start Date End Date Taking?  Authorizing Provider  albuterol (ACCUNEB) 0.63 MG/3ML nebulizer solution Take 2 ampules by nebulization every 6 (six) hours as needed for wheezing.    [provider]  aspirin EC 81 MG tablet Take 1 tablet (81 mg total) by mouth daily. Patient taking differently: Take 81 mg by mouth 3 (three) times a week.  04/09/14   Debbe Odea, MD  atorvastatin (LIPITOR) 10 MG tablet One qd x 1 month, then 2 qd if tolerated Patient taking differently: Take 10-20 mg by mouth See admin instructions. Take 10 mg by mouth once a day for 1 month, then increase to 20 mg once a day (if tolerated) 04/01/19   Rodriguez-Southworth, Sunday Spillers, PA-C  dicyclomine (BENTYL) 20 MG tablet Take 20 mg by mouth 2 (two) times daily. 10/13/17   [provider]  glucose blood (ONETOUCH VERIO) test strip Use to check blood sugar 3 to 4 times a day before meals 03/25/19   Rodriguez-Southworth, Sunday Spillers, PA-C  hydroxychloroquine (PLAQUENIL) 200 MG tablet  Take 200 mg by mouth 2 (two) times daily.    [provider]  hydrOXYzine (ATARAX/VISTARIL) 10 MG tablet 1 q 8h prn itching Patient taking differently: Take 10 mg by mouth every 8 (eight) hours as needed for itching.  04/22/19   Rodriguez-Southworth, Sunday Spillers, PA-C  methylPREDNISolone (MEDROL) 4 MG tablet Take 4 mg by mouth daily.     [provider]  pantoprazole (PROTONIX) 40 MG tablet Take 1 tablet (40 mg total) by mouth 2 (two) times daily. Take 1 tablet, 40 mg, by mouth 2 times daily x6 weeks then 1 tablet, 40 mg once daily indefinitely 07/06/19 09/04/19  Kayleen Memos, DO  Semaglutide (RYBELSUS) 7 MG TABS Take 1 tablet by mouth daily for 30 days, THEN 1 tablet daily. X 1 month, then 2 qd. 05/06/19 07/05/19  Rodriguez-Southworth, Sunday Spillers, PA-C  simethicone (GAS-X EXTRA STRENGTH) 125 MG chewable tablet Chew 125 mg by mouth every 6 (six) hours as needed for flatulence.    [provider]  sucralfate (CARAFATE) 1 GM/10ML suspension Take 10 mLs (1 g total) by  mouth 4 (four) times daily -  with meals and at bedtime. 05/26/19   Kayleen Memos, DO  traMADol (ULTRAM) 50 MG tablet Take 1 tablet (50 mg total) by mouth every 12 (twelve) hours as needed for moderate pain. 05/07/19   Rodriguez-Southworth, Sunday Spillers, PA-C    Family History Family History  Problem Relation Age of Onset  . Hypertension Mother   . Heart attack Father   . Diabetes Mellitus II Sister   . Hypertension Sister   . Cancer - Other Sister   . Migraines Niece   . Colon cancer Neg Hx     Social History Social History   Tobacco Use  . Smoking status: Former Smoker    Types: Cigarettes  . Smokeless tobacco: Never Used  . Tobacco comment: 05/16/2017"1 pack of cigarettes would last 3-4 months; haven't had any cigarettes for 40 years"  Substance Use Topics  . Alcohol use: Not Currently    Frequency: Never    Comment: 05/16/2017 "used to drink beer in the 1990's; don't drink at all now"  . Drug use: No     Allergies   Patient has no known allergies.   Review of Systems Review of Systems  Constitutional: Negative for fever.  HENT: Negative for trouble swallowing.   Cardiovascular: Negative for chest pain.  Gastrointestinal: Positive for diarrhea and vomiting. Negative for abdominal pain and blood in stool.  Musculoskeletal: Positive for arthralgias and neck pain.  All other systems reviewed and are negative.    Physical Exam Updated Vital Signs BP 124/87 (BP Location: Left Arm)   Pulse (!) 124   Temp 98.5 F (36.9 C) (Oral)   Resp 18   SpO2 94%   Physical Exam CONSTITUTIONAL: Elderly, anxious HEAD: Normocephalic/atraumatic EYES: EOMI/PERRL ENMT: Mucous membranes moist, no drooling, no angioedema NECK: supple no meningeal signs, no thrill, no bruit.  No stridor No hematoma noted to neck SPINE/BACK:entire spine nontender, paraspinal tenderness to neck CV: S1/S2 noted, no murmurs/rubs/gallops noted, tachycardic LUNGS: Lungs are clear to auscultation  bilaterally, no apparent distress ABDOMEN: soft, nontender, no rebound or guarding, bowel sounds noted throughout abdomen GU:no cva tenderness NEURO: Pt is awake/alert/appropriate, moves all extremitiesx4.  No facial droop.   EXTREMITIES: pulses normal/equal, full ROM SKIN: warm, color normal PSYCH: Anxious   ED Treatments / Results  Labs (all labs ordered are listed, but only abnormal results are displayed) Labs Reviewed  LIPASE,  BLOOD - Abnormal; Notable for the following components:      Result Value   Lipase 148 (*)    All other components within normal limits  COMPREHENSIVE METABOLIC PANEL - Abnormal; Notable for the following components:   Potassium 2.7 (*)    CO2 21 (*)    Glucose, Bld 132 (*)    BUN <5 (*)    Calcium 8.5 (*)    Albumin 3.4 (*)    Alkaline Phosphatase 37 (*)    GFR calc non Af Amer 54 (*)    Anion gap 16 (*)    All other components within normal limits  CBC - Abnormal; Notable for the following components:   WBC 12.5 (*)    All other components within normal limits  MAGNESIUM    EKG EKG Interpretation  Date/Time:  Sunday May 30 2019 04:49:36 EST Ventricular Rate:  111 PR Interval:    QRS Duration: 82 QT Interval:  341 QTC Calculation: 464 R Axis:   -34 Text Interpretation: Sinus tachycardia Probable left atrial enlargement Left axis deviation No significant change since last tracing Confirmed by Ripley Fraise [phone]) on 05/30/2019 5:00:10 AM   Radiology Dg Chest Port 1 View  Result Date: 05/30/2019 CLINICAL DATA:  Shoulder and chest pain. EXAM: PORTABLE CHEST 1 VIEW COMPARISON:  05/19/2019 FINDINGS: Normal sized heart. Clear lungs. Diffuse osteopenia. Mild to moderate bilateral shoulder degenerative changes. IMPRESSION: No acute abnormality. Electronically Signed   By: Claudie Revering M.D.   On: 05/30/2019 05:32    Procedures Procedures  Medications Ordered in ED Medications  sodium chloride flush (NS) 0.9 % injection 3 mL (has no  administration in time range)  methylPREDNISolone (MEDROL) tablet 4 mg (has no administration in time range)  sodium chloride 0.9 % bolus 1,000 mL (1,000 mLs Intravenous New Bag/Given 05/30/19 0508)  ondansetron (ZOFRAN) injection 4 mg (4 mg Intravenous Given 05/30/19 0509)  fentaNYL (SUBLIMAZE) injection 100 mcg (100 mcg Intravenous Given 05/30/19 0546)  potassium chloride 10 mEq in 100 mL IVPB (0 mEq Intravenous Stopped 05/30/19 0657)  traMADol (ULTRAM) tablet 50 mg (50 mg Oral Given 05/30/19 0629)  potassium chloride SA (KLOR-CON) CR tablet 40 mEq (40 mEq Oral Given 05/30/19 [id number])     Initial Impression / Assessment and Plan / ED Course  I have reviewed the triage vital signs and the nursing notes.  Pertinent labs & imaging results that were available during my care of the patient were reviewed by me and considered in my medical decision making (see chart for details).        5:04 AM Patient presents for evaluation of continued vomiting and diarrhea.  She was recently in the hospital had a EGD done with biopsies.  She reports the symptoms that she had at that time are continuing.  She is now reporting shoulder and neck pain of unclear etiology.  She is not drooling, she is not having trouble swallowing.  No stridor is noted. No chest or abdominal pain. Patient is dehydrated/hypokalemic. 6:38 AM Patient was reporting diffuse body pain.  She appeared anxious.  After dose of IV pain medicine she seemed to improve She now reports that her shoulder and neck pain is likely due to her rheumatoid arthritis.  She takes steroids each day and has missed her doses due to vomiting.  Looking at her chart, she takes Medrol 4 mg daily.  She also takes tramadol.  She will be given both of these medications. Her vital signs are improving. She denies  any abdominal pain and no focal tenderness.  Chest x-ray is negative Patient continues to keep p.o. fluids down and is improving, she may be amenable for  discharge 7:09 AM Signed out to dr lockwood at shift change  Final Clinical Impressions(s) / ED Diagnoses   Final diagnoses:  Hypokalemia  Non-intractable vomiting with nausea, unspecified vomiting type  Dehydration    ED Discharge Orders    None       Ripley Fraise, MD 05/30/19 [phone]

## 2019-05-30 NOTE — ED Provider Notes (Signed)
9:26 AM Patient continues to have nausea, though she has not had vomiting in some time. She also continues to complain of worsening of her typical pain. Hemodynamically she is unremarkable, but with hypomagnesemia, hypokalemia, and ongoing nausea, diminished capacity to tolerate oral resuscitation, the patient will be admitted for further monitoring, management.   Carmin Muskrat, MD 05/30/19 [phone]

## 2019-05-30 NOTE — ED Notes (Signed)
Date and time results received: 05/30/19 0114 (use smartphrase ".now" to insert current time)  Test: Potassium Critical Value: 2.7  Name of Provider Notified: Dr Roxanne Mins  Orders Received? Or Actions Taken?: patient to be moved to room

## 2019-05-30 NOTE — Progress Notes (Signed)
PT Cancellation/Discharge Note  Patient Details Name: [name] MRN: [id number] DOB: April 10, 1936   Cancelled Treatment:    Reason Eval/Treat Not Completed: Patient declined, no reason specified  Patient adamantly refusing to participate with PT "at any time." Noted she walked 200-350 ft on last admission (discharged 05/26/19) and then refused any further PT interventions.   At this time she denies falls at home and reports she has all the DME she needs. Per patient request, will discontinue PT.    Barry Brunner, PT Pager [phone]   Rexanne Mano 05/30/2019, 3:21 PM

## 2019-05-30 NOTE — H&P (Addendum)
History and Physical    [name] ZOX:[id number] DOB: [birth year] DOA: 05/29/2019  Referring MD/NP/PA: Carmin Muskrat, MD PCP: [name], PA-C  Patient coming from: Home via EMS  Chief Complaint: Pain  I have personally briefly reviewed patient's old medical records in [place]   HPI: [name] is a 83 y.o. female with medical history significant of chronic diastolic CHF, HTN, COPD, DM type II, and rheumatoid arthritis on chronic immunosuppressive therapy who presented from home with complaints of severe pain.  Patient had just recently been hospitalized 10/21-10/28 for abdominal pain with nausea, vomiting, and diarrhea.  Patient was seen by gastroenterology and had EGD performed.  She had initially met SIRS criteria and had completed 3 days of cefepime.  Patient's initial urinalysis was abnormal, but culture did not grow out any specific organism.  Her acute kidney injury resolved with IV fluids(cR 4.91->0.8) and electrolyte abnormalities were corrected. Pathology from the recent EGD revealed gastric antral and oxyntic mucosa with focal active chronic gastritis which was negative for H. pylori, but no other significant abnormalities were appreciated on the other biopsies taken.  Since being home patient reported that she was initially doing well. She had been taking the Carafate 4 times daily as prescribed.  She reports that the pharmacy never given a prescription for the Protonix and also were out of her 4 mg of Medrol tablets.  Patient has been out of her steroids for at least a week and a half.  She reports it was issue insurance in regards to her diabetic medication that she was on and they were considering starting her on insulin.  Over the last 1-2 days she started developing severe pain in her neck that went into her shoulder, and then developed aching all over body.  She reports that she still been having some intermittent nausea and vomiting, but  diarrhea symptoms improved where she is only having 1-2 bowel movements per day.  Associated symptoms include reports of subjective fevers.  She lives at home with home niece normally helps her take care of her medicines, but she has been doing much of anything due to her pain symptoms.  For the month   ED Course: Upon admission into the emergency department patient was seen to be afebrile, tachycardic, tachypneic, and all other vital signs maintained.  Labs significant for WBC 12.5, potassium 2.7, and magnesium 0.9.  Chest x-ray did not show any acute abnormalities.  Patient was given 1 L normal saline IV fluids, 4 mg of methylprednisolone, 2 g of magnesium sulfate, total 50 mEq of potassium chloride, tramadol, and 100 mg of fentanyl.  TRH called to admit.   Review of Systems  Constitutional: Positive for fever (Subjective) and malaise/fatigue.  HENT: Negative for congestion and nosebleeds.   Eyes: Negative for double vision and photophobia.  Respiratory: Negative for cough and shortness of breath.   Cardiovascular: Negative for chest pain and leg swelling.  Gastrointestinal: Positive for nausea and vomiting. Negative for abdominal pain and diarrhea.  Musculoskeletal: Positive for joint pain, myalgias and neck pain.  Skin: Negative for rash.  Neurological: Negative for loss of consciousness.  Psychiatric/Behavioral: Negative for memory loss and substance abuse. The patient has insomnia.     Past Medical History:  Diagnosis Date   Acute diastolic heart failure ([place]) 06/15/2018   Acute kidney injury ([place]) 01/22/2015   Acute renal failure ([place])    Acute respiratory failure with hypoxia ([place]) 06/14/2015   Allergy    Anemia    "  when I was young"  °• Anginal pain (HCC)   °• Anxiety   °• Arthritis   ° "all over my body"   °• Chest pain 04/07/2014  °• Chronic bronchitis (HCC)   ° "get it just about q yr" (06/14/2015)  °• Chronic diastolic CHF (congestive heart failure) (HCC)   °• Chronic  diastolic heart failure, NYHA class 1 (HCC) 01/22/2015  °• Chronic lower back pain   °• Chronic steroid use 12/11/2012  °• Constipation 01/09/2017  °• COPD (chronic obstructive pulmonary disease) (HCC) 01/09/2017  °• Dehydration, moderate 12/11/2012  °• Disequilibrium syndrome 01/22/2015  °• DM (diabetes mellitus) (HCC) 04/08/2014  °• Dyspnea 08/10/2018  °• Dysrhythmia   ° patient unsure what it is but has been told that.  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  °• Elevated lactic acid level 01/22/2015  °• Essential hypertension 06/15/2018  °• Exertional shortness of breath   °• Frequent UTI   ° "recently" (06/14/2015)  °• Gastric mass   °• GERD (gastroesophageal reflux disease)   °• Headache(784.0)   ° "weekly; wake up w/them " (110/19/2018)  °• Heart murmur   °• High cholesterol   °• History of blood transfusion ~ 1954  ° "related to my periods"  °• Hyperglycemia 12/11/2012  °• Hypertension   °• Intractable nausea and vomiting 05/15/2017  °• Migraine   ° "once/month maybe" (05/16/2017)  °• Multiple falls 06/14/2015  °• Noncompliance 12/11/2012  °• Orthostasis 06/14/2015  °• OSA (obstructive sleep apnea)   ° "I think I'm using a BiPAP" (06/14/2015)  °• Sepsis (HCC) 01/09/2017  °• Sepsis secondary to UTI (HCC)   °• Syncope and collapse 12/11/2012  °• Tachycardia with heart rate 100-120 beats per minute 04/08/2014  °• Type II diabetes mellitus (HCC)   °• Uncontrolled type 2 diabetes mellitus with complication (HCC)   °• UTI (lower urinary tract infection) 12/11/2012  °• Weight loss 06/14/2015  ° ° °Past Surgical History:  °Procedure Laterality Date  °• APPENDECTOMY  1970  ° /notes 12/12/2010  °• BACK SURGERY    °• BIOPSY  05/25/2019  ° Procedure: BIOPSY;  Surgeon: Mansouraty, Gabriel Jr., MD;  Location: [facility];  Service: Gastroenterology;;  °• ECTOPIC PREGNANCY SURGERY  1970  ° /notes 12/12/2010  °• ESOPHAGOGASTRODUODENOSCOPY N/A 05/25/2019  ° Procedure: ESOPHAGOGASTRODUODENOSCOPY (EGD);  Surgeon: Mansouraty,  Gabriel Jr., MD;  Location: [facility];  Service: Gastroenterology;  Laterality: N/A;  °• EUS N/A 10/16/2017  ° Procedure: UPPER ENDOSCOPIC ULTRASOUND (EUS) RADIAL;  Surgeon: Jacobs, Daniel P, MD;  Location: [facility];  Service: Endoscopy;  Laterality: N/A;  °• FRACTURE SURGERY    °• LAPAROSCOPIC CHOLECYSTECTOMY  1990's  °• LUMBAR MICRODISCECTOMY Left 09/2006  ° L5-S1/notes 12/12/2010  °• MULTIPLE EXTRACTIONS WITH ALVEOLOPLASTY Bilateral 06/13/2017  ° Procedure: MULTIPLE EXTRACTION WITH ALVEOLOPLASTY;  Surgeon: Jensen, Scott, DDS;  Location: [facility] OR;  Service: Oral Surgery;  Laterality: Bilateral;  °• MULTIPLE TOOTH EXTRACTIONS    °• ORIF SHOULDER FRACTURE Left ~ 2009  ° "put cement down in it"   °• TONSILLECTOMY  1950's?  °• TOTAL ABDOMINAL HYSTERECTOMY  1979  ° /notes 12/12/2010  ° ° ° reports that she has quit smoking. Her smoking use included cigarettes. She has never used smokeless tobacco. She reports previous alcohol use. She reports that she does not use drugs. ° °No Known Allergies ° °Family History  °Problem Relation Age of Onset  °• Hypertension Mother   °• Heart attack Father   °• Diabetes Mellitus II   Sister   °• Hypertension Sister   °• Cancer - Other Sister   °• Migraines Niece   °• Colon cancer Neg Hx   ° ° °Prior to Admission medications   °Medication Sig Start Date End Date Taking? Authorizing Provider  °albuterol (ACCUNEB) 0.63 MG/3ML nebulizer solution Take 2 ampules by nebulization every 6 (six) hours as needed for wheezing.   Yes [provider]  °aspirin EC 81 MG tablet Take 1 tablet (81 mg total) by mouth daily. °Patient taking differently: Take 81 mg by mouth 3 (three) times a week.  04/09/14  Yes Rizwan, Saima, MD  °atorvastatin (LIPITOR) 10 MG tablet One qd x 1 month, then 2 qd if tolerated °Patient taking differently: Take 10-20 mg by mouth See admin instructions. Take 10 mg by mouth once a day for 1 month, then increase to 20 mg once a day (if tolerated) 04/01/19  Yes  Rodriguez-Southworth, Sylvia, PA-C  °dicyclomine (BENTYL) 20 MG tablet Take 20 mg by mouth 2 (two) times daily. 10/13/17  Yes [provider]  °hydroxychloroquine (PLAQUENIL) 200 MG tablet Take 200 mg by mouth 2 (two) times daily.   Yes [provider]  °hydrOXYzine (ATARAX/VISTARIL) 10 MG tablet 1 q 8h prn itching °Patient taking differently: Take 10 mg by mouth every 8 (eight) hours as needed for itching.  04/22/19  Yes Rodriguez-Southworth, Sylvia, PA-C  °methylPREDNISolone (MEDROL) 4 MG tablet Take 4 mg by mouth daily.    Yes [provider]  °Semaglutide (RYBELSUS) 7 MG TABS Take 1 tablet by mouth daily for 30 days, THEN 1 tablet daily. X 1 month, then 2 qd. 05/06/19 07/05/19 Yes Rodriguez-Southworth, Sylvia, PA-C  °simethicone (GAS-X EXTRA STRENGTH) 125 MG chewable tablet Chew 125 mg by mouth every 6 (six) hours as needed for flatulence.   Yes [provider]  °traMADol (ULTRAM) 50 MG tablet Take 1 tablet (50 mg total) by mouth every 12 (twelve) hours as needed for moderate pain. 05/07/19  Yes Rodriguez-Southworth, Sylvia, PA-C  °glucose blood (ONETOUCH VERIO) test strip Use to check blood sugar 3 to 4 times a day before meals 03/25/19   Rodriguez-Southworth, Sylvia, PA-C  °pantoprazole (PROTONIX) 40 MG tablet Take 1 tablet (40 mg total) by mouth 2 (two) times daily. Take 1 tablet, 40 mg, by mouth 2 times daily x6 weeks then 1 tablet, 40 mg once daily indefinitely °Patient taking differently: Take 40 mg by mouth See admin instructions. Take 1 tablet, 40 mg, by mouth 2 times daily x6 weeks then 1 tablet, 40 mg once daily indefinitely 07/06/19 09/04/19  Hall, Carole N, DO  °sucralfate (CARAFATE) 1 GM/10ML suspension Take 10 mLs (1 g total) by mouth 4 (four) times daily -  with meals and at bedtime. 05/26/19   Hall, Carole N, DO  ° ° °Physical Exam: ° °Constitutional: Elderly female who appears to be in some acute discomfort. °Vitals:  ° 05/30/19 0830 05/30/19 0845 05/30/19 0900  05/30/19 0915  °BP: (!) 141/76 125/89 137/74 (!) 148/70  °Pulse: (!) 102 (!) 101 (!) 101 (!) 102  °Resp: (!) 28 (!) 31 (!) 32 (!) 31  °Temp:      °TempSrc:      °SpO2: 93% 93% 94% 94%  ° °Eyes: PERRL, lids and conjunctivae normal °ENMT: Mucous membranes are moist. Posterior pharynx clear of any exudate or lesions.  °Neck: Pain with movement of neck.  Muscles appeared tensed. °Respiratory: clear to auscultation bilaterally, no wheezing, no crackles. Normal respiratory effort. No accessory muscle use.  °  Cardiovascular: Tachycardic, no murmurs / rubs / gallops. No extremity edema. 2+ pedal pulses. No carotid bruits.  Abdomen: no tenderness, no masses palpated. No hepatosplenomegaly. Bowel sounds positive.  Musculoskeletal: no clubbing / cyanosis.  No significant joint swelling appreciated.  Range of motion is decreased secondary to pain. Skin: no rashes, lesions, ulcers. No induration Neurologic: CN 2-12 grossly intact. Sensation intact, DTR normal. Strength 5/5 in all 4.  Psychiatric: Normal judgment and insight. Alert and oriented x 3. Normal mood.     Labs on Admission: I have personally reviewed following labs and imaging studies  CBC: Recent Labs  Lab 05/30/19 0005  WBC 12.5*  HGB 12.8  HCT 38.5  MCV 90.2  PLT 800   Basic Metabolic Panel: Recent Labs  Lab 05/24/19 0527 05/25/19 0746 05/30/19 0005 05/30/19 0716  NA 141 139 136  --   K 3.4* 3.6 2.7*  --   CL 107 109 99  --   CO2 24 21* 21*  --   GLUCOSE 82 75 132*  --   BUN <5* <5* <5*  --   CREATININE 0.82 0.80 0.98  --   CALCIUM 7.5* 7.3* 8.5*  --   MG  --   --   --  0.9*   GFR: Estimated Creatinine Clearance: 39.2 mL/min (by C-G formula based on SCr of 0.98 mg/dL). Liver Function Tests: Recent Labs  Lab 05/30/19 0005  AST 37  ALT 27  ALKPHOS 37*  BILITOT 0.9  PROT 6.9  ALBUMIN 3.4*   Recent Labs  Lab 05/24/19 0527 05/30/19 0005  LIPASE 89* 148*   No results for input(s): AMMONIA in the last 168  hours. Coagulation Profile: No results for input(s): INR, PROTIME in the last 168 hours. Cardiac Enzymes: No results for input(s): CKTOTAL, CKMB, CKMBINDEX, TROPONINI in the last 168 hours. BNP (last 3 results) Recent Labs    06/08/18 1158  PROBNP 98   HbA1C: No results for input(s): HGBA1C in the last 72 hours. CBG: Recent Labs  Lab 05/25/19 1137 05/25/19 1640 05/25/19 2114 05/26/19 0729 05/26/19 1136  GLUCAP 77 112* 89 85 128*   Lipid Profile: No results for input(s): CHOL, HDL, LDLCALC, TRIG, CHOLHDL, LDLDIRECT in the last 72 hours. Thyroid Function Tests: No results for input(s): TSH, T4TOTAL, FREET4, T3FREE, THYROIDAB in the last 72 hours. Anemia Panel: No results for input(s): VITAMINB12, FOLATE, FERRITIN, TIBC, IRON, RETICCTPCT in the last 72 hours. Urine analysis:    Component Value Date/Time   COLORURINE YELLOW 05/20/2019 0256   APPEARANCEUR HAZY (A) 05/20/2019 0256   LABSPEC 1.018 05/20/2019 0256   PHURINE 5.0 05/20/2019 0256   GLUCOSEU NEGATIVE 05/20/2019 0256   HGBUR SMALL (A) 05/20/2019 0256   BILIRUBINUR NEGATIVE 05/20/2019 0256   BILIRUBINUR negative 09/10/2018 1523   KETONESUR 5 (A) 05/20/2019 0256   PROTEINUR 30 (A) 05/20/2019 0256   UROBILINOGEN 0.2 09/10/2018 1523   UROBILINOGEN 1.0 03/10/2015 1459   NITRITE NEGATIVE 05/20/2019 0256   LEUKOCYTESUR MODERATE (A) 05/20/2019 0256   Sepsis Labs: No results found for this or any previous visit (from the past 240 hour(s)).   Radiological Exams on Admission: Dg Chest Port 1 View  Result Date: 05/30/2019 CLINICAL DATA:  Shoulder and chest pain. EXAM: PORTABLE CHEST 1 VIEW COMPARISON:  05/19/2019 FINDINGS: Normal sized heart. Clear lungs. Diffuse osteopenia. Mild to moderate bilateral shoulder degenerative changes. IMPRESSION: No acute abnormality. Electronically Signed   By: Claudie Revering M.D.   On: 05/30/2019 05:32    EKG: Independently  reviewed.  Sinus tachycardia 111 bpm with first-degree heart  block. ° °Assessment/Plan °Hypokalemia and hypomagnesemia: Acute.  She presents with potassium 2.7 and magnesium 0.9.  Patient was given a total of 50 mEq of potassium chloride and 2 g of magnesium sulfate IV emergency department.  Suspect potassium and magnesium levels to still be low based off doses given. °-Admit to a medical telemetry bed °-Give an additional 2 g of magnesium sulfate IV °-Give additional 40 mEq of potassium chloride p.o. °-Continue to monitor and replace as needed ° °Nausea, vomiting, and diarrhea / gastritis: Improved, but not completely resolved..  Patient reports continued nausea with intermittent vomiting.  Diarrhea has somewhat improved reporting only having 1-2 bowel movements per day.  Just recently admitted 10/21-10/28 status post EGD on 10/27 which revealed gastritis on biopsies and no signs of H. pylori.  Since being home patient has been taking Carafate 4 times daily, but reported that they did not give her medications of Protonix.  Question continued gastritis or pancreatitis given elevated lipase. °-Monitor intake and output °-Advance diet as tolerated °-Antiemetics as needed °-Normal saline at 75 mL/h x1 L °-Recheck urinalysis °-Continue Carafate °-Start Protonix per previous recommendations of twice daily for 6 weeks, then once daily thereafter °-Will need to keep outpatient follow-up with gastroenterology °-Care management consult for medication needs °-May warrant stool studies if diarrhea restarts given recent antibiotics given ° °SIRS: Acute.  Patient presents with tachycardia and tachypnea, but was noted to be afebrile.  Labs revealed WBC elevated at 12.5.  This could be related with patient's chronic steroid use.  No clear signs of infection appreciated. °-Check urinalysis ° °Rheumatoid arthritis on chronic immunosuppression: Acute on chronic.  Patient reports that over the last week and a half the drugstore had been out of her steroids.  Thereafter she had developed pain  all over most severe in her neck unable to do anything because of pain.  Suspect possible flareup of patient's rheumatoid arthritis. °-Check CRP, ESR, and CK °-Continue Medrol and Plaquenil °-Continue tramadol as needed for moderate pain °-Add hydrocodone as needed for severe pain °-Physical therapy to evaluate ° °Elevated lipase: Acute on chronic.  On admission lipase elevated up to 148.  Triglycerides were noted to be elevated up to 275 back on 08/13/2018. °-Continue IV fluids °-Follow-up lipid panel °-Continue to monitor ° °Diabetes mellitus type 2: Patient reports possibly no longer being on semaglutide due issues with insurance. °-Hypoglycemic protocol °-CBGs before every meal and nightly with sensitive SSI ° °Hyperlipidemia: Patient currently is only taking 10 mg of atorvastatin, but previously was advised to increase to 20 mg after 1 month. °-Continue atorvastatin at current dose for now ° °COPD: Patient without acute exacerbation at this time. °At home patient takes 2 amps of albuterol as needed for shortness of breath and wheezing °-Continue albuterol per home regimen as needed ° °GERD with gastritis: As seen above. °-Continue Carafate and Protonix ° °DVT prophylaxis: Lovenox °Code Status: Full °Family Communication: No family present at bedside °Disposition Plan: Possible discharge home in 1 to 2 days °Consults called: None °Admission status: Observation ° ° A  MD °[facility] °Pager [phone] ° ° °If 7PM-7AM, please contact night-coverage [email or web address] °Password [email or web address] ° °05/30/2019, 9:34 AM  ° ° ° °

## 2019-05-30 NOTE — ED Notes (Signed)
Patient observed on floor, RN was

## 2019-05-31 ENCOUNTER — Observation Stay (HOSPITAL_COMMUNITY): Payer: Medicare Other

## 2019-05-31 DIAGNOSIS — E876 Hypokalemia: Secondary | ICD-10-CM | POA: Diagnosis not present

## 2019-05-31 DIAGNOSIS — D84821 Immunodeficiency due to drugs: Secondary | ICD-10-CM | POA: Diagnosis not present

## 2019-05-31 LAB — CBC
HCT: 30.6 % — ABNORMAL LOW (ref 36.0–46.0)
Hemoglobin: 10 g/dL — ABNORMAL LOW (ref 12.0–15.0)
MCH: 29.6 pg (ref 26.0–34.0)
MCHC: 32.7 g/dL (ref 30.0–36.0)
MCV: 90.5 fL (ref 80.0–100.0)
Platelets: 305 10*3/uL (ref 150–400)
RBC: 3.38 MIL/uL — ABNORMAL LOW (ref 3.87–5.11)
RDW: 13.8 % (ref 11.5–15.5)
WBC: 9.4 10*3/uL (ref 4.0–10.5)
nRBC: 0 % (ref 0.0–0.2)

## 2019-05-31 LAB — BASIC METABOLIC PANEL
Anion gap: 9 (ref 5–15)
BUN: 7 mg/dL — ABNORMAL LOW (ref 8–23)
CO2: 21 mmol/L — ABNORMAL LOW (ref 22–32)
Calcium: 7.7 mg/dL — ABNORMAL LOW (ref 8.9–10.3)
Chloride: 107 mmol/L (ref 98–111)
Creatinine, Ser: 0.86 mg/dL (ref 0.44–1.00)
GFR calc Af Amer: >60 mL/min (ref >60)
GFR calc non Af Amer: >60 mL/min (ref >60)
Glucose, Bld: 82 mg/dL (ref 70–99)
Potassium: 3.4 mmol/L — ABNORMAL LOW (ref 3.5–5.1)
Sodium: 137 mmol/L (ref 135–145)

## 2019-05-31 LAB — MAGNESIUM: Magnesium: 1.9 mg/dL (ref 1.7–2.4)

## 2019-05-31 LAB — GLUCOSE, CAPILLARY
Glucose-Capillary: 100 mg/dL — ABNORMAL HIGH (ref 70–99)
Glucose-Capillary: 103 mg/dL — ABNORMAL HIGH (ref 70–99)

## 2019-05-31 MED ORDER — METHYLPREDNISOLONE 4 MG PO TABS: 4.0000 mg | ORAL_TABLET | Freq: Every day | ORAL | 0 refills | Status: AC

## 2019-05-31 MED ORDER — PANTOPRAZOLE SODIUM 40 MG PO TBEC: 40.0000 mg | DELAYED_RELEASE_TABLET | Freq: Two times a day (BID) | ORAL | 0 refills | Status: DC

## 2019-05-31 MED ORDER — POTASSIUM CHLORIDE CRYS ER 20 MEQ PO TBCR
40.0000 meq | EXTENDED_RELEASE_TABLET | ORAL | Status: AC
  Administered 2019-05-31 (×2): 40 meq via ORAL
  Filled 2019-05-31 (×2): qty 2

## 2019-05-31 NOTE — TOC Transition Note (Signed)
Transition of Care [facility]) - CM/SW Discharge Note   Patient Details  Name: [name] MRN: [id number] Date of Birth: Jun 03, 1936  [facility] CM/SW Contact:  Marilu Favre, RN Phone Number: 05/31/2019, 4:00 PM   Clinical Narrative:       Final next level of care: [place] Barriers to Discharge: No Barriers Identified   Patient Goals and CMS Choice Patient states their goals for this hospitalization and ongoing recovery are:: to go home CMS Medicare.gov Compare Post Acute Care list provided to:: Patient Choice offered to / list presented to : Patient  Discharge Placement                       Discharge Plan and Services   Discharge Planning Services: CM Consult Post Acute Care Choice: Home Health          DME Arranged: N/A         HH Arranged: PT, OT [place] Agency: [place] Date [place]: 05/31/19 Time [place]: 1559 Representative spoke with at [place]: [place] ([place]) Interventions     Readmission Risk Interventions No flowsheet data found.

## 2019-05-31 NOTE — Discharge Instructions (Signed)
Gastritis, Adult ° °Gastritis is swelling (inflammation) of the stomach. Gastritis can develop quickly (acute). It can also develop slowly over time (chronic). It is important to get help for this condition. If you do not get help, your stomach can bleed, and you can get sores (ulcers) in your stomach. °What are the causes? °This condition may be caused by: °· Germs that get to your stomach. °· Drinking too much alcohol. °· Medicines you are taking. °· Too much acid in the stomach. °· A disease of the intestines or stomach. °· Stress. °· An allergic reaction. °· Crohn's disease. °· Some cancer treatments (radiation). °Sometimes the cause of this condition is not known. °What are the signs or symptoms? °Symptoms of this condition include: °· Pain in your stomach. °· A burning feeling in your stomach. °· Feeling sick to your stomach (nauseous). °· Throwing up (vomiting). °· Feeling too full after you eat. °· Weight loss. °· Bad breath. °· Throwing up blood. °· Blood in your poop (stool). °How is this diagnosed? °This condition may be diagnosed with: °· Your medical history and symptoms. °· A physical exam. °· Tests. These can include: °? Blood tests. °? Stool tests. °? A procedure to look inside your stomach (upper endoscopy). °? A test in which a sample of tissue is taken for testing (biopsy). °How is this treated? °Treatment for this condition depends on what caused it. You may be given: °· Antibiotic medicine, if your condition was caused by germs. °· H2 blockers and similar medicines, if your condition was caused by too much acid. °Follow these instructions at home: °Medicines °· Take over-the-counter and prescription medicines only as told by your doctor. °· If you were prescribed an antibiotic medicine, take it as told by your doctor. Do not stop taking it even if you start to feel better. °Eating and drinking ° °· Eat small meals often, instead of large meals. °· Avoid foods and drinks that make your symptoms  worse. °· Drink enough fluid to keep your pee (urine) pale yellow. °Alcohol use °· Do not drink alcohol if: °? Your doctor tells you not to drink. °? You are pregnant, may be pregnant, or are planning to become pregnant. °· If you drink alcohol: °? Limit your use to: °§ 0-1 drink a day for women. °§ 0-2 drinks a day for men. °? Be aware of how much alcohol is in your drink. In the [place] one drink equals one 12 oz bottle of beer (355 mL), one 5 oz glass of wine (148 mL), or one 1½ oz glass of hard liquor (44 mL). °General instructions °· Talk with your doctor about ways to manage stress. You can exercise or do deep breathing, meditation, or yoga. °· Do not smoke or use products that have nicotine or tobacco. If you need help quitting, ask your doctor. °· Keep all follow-up visits as told by your doctor. This is important. °Contact a doctor if: °· Your symptoms get worse. °· Your symptoms go away and then come back. °Get help right away if: °· You throw up blood or something that looks like coffee grounds. °· You have black or dark red poop. °· You throw up any time you try to drink fluids. °· Your stomach pain gets worse. °· You have a fever. °· You do not feel better after one week. °Summary °· Gastritis is swelling (inflammation) of the stomach. °· You must get help for this condition. If you do not get help, your stomach   can bleed, and you can get sores (ulcers). °· This condition is diagnosed with medical history, physical exam, or tests. °· You can be treated with medicines for germs or medicines to block too much acid in your stomach. °This information is not intended to replace advice given to you by your health care provider. Make sure you discuss any questions you have with your health care provider. °Document Released: 01/01/2008 Document Revised: 12/02/2017 Document Reviewed: 12/02/2017 °Elsevier Patient Education © 2020 [employer] ° °

## 2019-05-31 NOTE — Evaluation (Signed)
Physical Therapy Evaluation Patient Details Name: [name] MRN: [id number] DOB: 1935-08-07 Today's Date: 05/31/2019   History of Present Illness  [name] is a 83 y.o. female with medical history significant of chronic diastolic CHF, HTN, COPD, DM type II, and rheumatoid arthritis on chronic immunosuppressive therapy who presented from home with complaints of severe pain.  Patient had just recently been hospitalized 10/21-10/28 for abdominal pain with nausea, vomiting, and diarrhea.  Found to have been without her steroid medication for a week and a half.  Clinical Impression  Patient presents with decreased balance, foot pain and some generalized weakness.  Encouraged to use the walker at home due to foot pain and some general weakness/imbalance.  However may not be too far off from her baseline once her medications are optimized.  Feel she can go home with intermittent family support and follow up HHPT.  Still a fall risk, but optimized for safety with walker.  No further skilled acute PT needs as likely d/c home today.     Follow Up Recommendations Home health PT;Supervision - Intermittent    Equipment Recommendations  None recommended by PT    Recommendations for Other Services       Precautions / Restrictions Precautions Precautions: Fall Precaution Comments: Last fall was 8 months ago.      Mobility  Bed Mobility Overal bed mobility: Modified Independent             General bed mobility comments: increased time  Transfers Overall transfer level: Needs assistance Equipment used: Rolling walker (2 wheeled) Transfers: Sit to/from Stand Sit to Stand: Min guard            Ambulation/Gait Ambulation/Gait assistance: Supervision;Min guard Gait Distance (Feet): 90 Feet Assistive device: Rolling walker (2 wheeled) Gait Pattern/deviations: Step-through pattern;Antalgic;Decreased stance time - right     General Gait Details: mild decrease in stance on R,  but slightly imprroved with increased ambulation; reports didn't feel she could safely use walker previously, but despite initial increase in proximity mobilized with walker well  Stairs            Wheelchair Mobility    Modified Rankin (Stroke Patients Only)       Balance Overall balance assessment: Needs assistance   Sitting balance-Leahy Scale: Good       Standing balance-Leahy Scale: Fair Standing balance comment: walking a little in room to chair no device; encouraged to use walker for safety and foot pai                             Pertinent Vitals/Pain Pain Assessment: 0-10 Pain Score: 8  Pain Location: R foot, Pain Descriptors / Indicators: Aching;Sharp Pain Intervention(s): Monitored during session;RN gave pain meds during session    [place] expects to be discharged to:: Private residence Living Arrangements: Alone Available Help at Discharge: Family;Available PRN/intermittently Type of Home: Apartment Home Access: Stairs to enter Entrance Stairs-Rails: Left Entrance Stairs-Number of Steps: 3 Home Layout: One level Home Equipment: Tub bench;Bedside commode;Walker - 2 wheels Additional Comments: neice and nephew assist wtih IADL's    Prior Function Level of Independence: Independent         Comments: does her own cooking, but neice and nephew get groceries, manage medications and help with the trash     Hand Dominance        Extremity/Trunk Assessment   Upper Extremity Assessment Upper Extremity Assessment: Generalized weakness  Lower Extremity Assessment Lower Extremity Assessment: Generalized weakness       Communication   Communication: No difficulties  Cognition Arousal/Alertness: Awake/alert Behavior During Therapy: WFL for tasks assessed/performed Overall Cognitive Status: No family/caregiver present to determine baseline cognitive functioning                                  General Comments: has help with medication and bills      General Comments General comments (skin integrity, edema, etc.): denies need for [facility] as neice helps with showers weekly    Exercises     Assessment/Plan    PT Assessment All further PT needs can be met in the next venue of care  PT Problem List Decreased strength;Decreased activity tolerance;Decreased mobility;Decreased balance;Decreased safety awareness       PT Treatment Interventions      PT Goals (Current goals can be found in the Care Plan section)  Acute Rehab PT Goals Patient Stated Goal: to go home today PT Goal Formulation: All assessment and education complete, DC therapy    Frequency     Barriers to discharge        Co-evaluation               AM-PAC PT "6 Clicks" Mobility  Outcome Measure Help needed turning from your back to your side while in a flat bed without using bedrails?: A Little Help needed moving from lying on your back to sitting on the side of a flat bed without using bedrails?: A Little Help needed moving to and from a bed to a chair (including a wheelchair)?: A Little Help needed standing up from a chair using your arms (e.g., wheelchair or bedside chair)?: A Little Help needed to walk in hospital room?: A Little Help needed climbing 3-5 steps with a railing? : A Little 6 Click Score: 18    End of Session   Activity Tolerance: Patient tolerated treatment well Patient left: in chair;with call bell/phone within reach   PT Visit Diagnosis: Other abnormalities of gait and mobility (R26.89);Muscle weakness (generalized) (M62.81)    Time: 4799-8721 PT Time Calculation (min) (ACUTE ONLY): 29 min   Charges:   PT Evaluation $PT Eval Moderate Complexity: 1 Mod PT Treatments $Gait Training: 8-22 mins        Magda Kiel, [place] [facility] [phone] 05/31/2019   Reginia Naas 05/31/2019, 2:43 PM

## 2019-05-31 NOTE — Progress Notes (Signed)
PROGRESS NOTE    Donovan Gatchel  QQI:[id number] DOB: November 04, 1935 DOA: 05/29/2019 PCP: [name], PA-C   Brief Narrative:  [name] is a 83 y.o. female with medical history significant of chronic diastolic CHF, HTN, COPD, DM type II, and rheumatoid arthritis on chronic immunosuppressive therapy who presented from home with complaints of severe pain.  Patient had just recently been hospitalized 10/21-10/28 for abdominal pain with nausea, vomiting, and diarrhea.  Patient was seen by gastroenterology and had EGD performed.  She had initially met SIRS criteria and had completed 3 days of cefepime.  Patient's initial urinalysis was abnormal, but culture did not grow out any specific organism.  Her acute kidney injury resolved with IV fluids(cR 4.91->0.8) and electrolyte abnormalities were corrected. Pathology from the recent EGD revealed gastric antral and oxyntic mucosa with focal active chronic gastritis which was negative for H. pylori, but no other significant abnormalities were appreciated on the other biopsies taken.  Since being home patient reported that she was initially doing well. She had been taking the Carafate 4 times daily as prescribed.  She reports that the pharmacy never given a prescription for the Protonix and also were out of her 4 mg of Medrol tablets.  Patient has been out of her steroids for at least a week and a half.  She reports it was issue insurance in regards to her diabetic medication that she was on and they were considering starting her on insulin.  Over the last 1-2 days she started developing severe pain in her neck that went into her shoulder, and then developed aching all over body.  She reports that she still been having some intermittent nausea and vomiting, but diarrhea symptoms improved where she is only having 1-2 bowel movements per day.  Associated symptoms include reports of subjective fevers.  She lives at home with home niece normally helps  her take care of her medicines, but she has been doing much of anything due to her pain symptoms.  For the month   ED Course: Upon admission into the emergency department patient was seen to be afebrile, tachycardic, tachypneic, and all other vital signs maintained.  Labs significant for WBC 12.5, potassium 2.7, and magnesium 0.9.  Chest x-ray did not show any acute abnormalities.  Patient was given 1 L normal saline IV fluids, 4 mg of methylprednisolone, 2 g of magnesium sulfate, total 50 mEq of potassium chloride, tramadol, and 100 mg of fentanyl.  TRH called to admit  Assessment & Plan:   Principal Problem:   Hypokalemia Active Problems:   Immunosuppression due to chronic steroid use   Rheumatoid arthritis (HCC)   DM (diabetes mellitus) (HCC)   Nausea, vomiting, and diarrhea   Hypomagnesemia   Gastritis   Elevated lipase   SIRS (systemic inflammatory response syndrome) (HCC)  Hypokalemia and hypomagnesemia: Acute.  She presents with potassium 2.7 and magnesium 0.9.  This was replaced in the ER.  Magnesium is normal today but potassium is still slightly low.  Will replace more today and recheck both of them tomorrow.  Nausea, vomiting, and diarrhea / gastritis:  Very minimal abdominal pain but no nausea vomiting or diarrhea anymore.  This is unresolving or persistent gastritis.  Due to some miscommunication, when she was discharged during recent hospitalization, patient understood that she was not supposed to start her omeprazole until December 8 so she was taking Carafate but not omeprazole.  We will continue PPI and Carafate here.    SIRS: Acute.  Patient presents  with tachycardia and tachypnea, but was noted to be afebrile.  Labs revealed WBC elevated at 12.5.  This could be related with patient's chronic steroid use.  No clear signs of infection appreciated.  UA negative, chest x-ray negative.  ESR and CRP were checked and are elevated but that is likely due to her rheumatoid  arthritis.  Leukocytosis resolved.  She still has intermittent tachycardia and tachypnea.  Rheumatoid arthritis on chronic immunosuppression: Acute on chronic.    Patient continues to have aches and pains all over the body, more so around shoulders.  Patient reports that over the last week and a half the drugstore had been out of her steroids.  Thereafter she had developed pain all over most severe in her neck unable to do anything because of pain.  This is likely flareup.  Continue steroids that have been started here.  She initially refused to be seen by PT OT but she looks very weak to me and will likely benefit from rehab.  Patient is against going to rehab at this point in time however she is willing to have home health.  I have consulted PT OT to assess her again so we can arrange home health for her.  Elevated lipase: Nonspecific.  Does not indicate pancreatitis.  Diabetes mellitus type 2: Controlled.  Continue SSI.   Hyperlipidemia: Continue current antilipid medications.  COPD: Patient without acute exacerbation at this time. At home patient takes 2 amps of albuterol as needed for shortness of breath and wheezing -Continue albuterol per home regimen as needed  Right ankle pain: She states that she has been hurting at the right ankle.  She also had an event of syncope at home.  On exam, it looks normal.  Obtained x-ray which ruled out any fracture.  This could be due to her rheumatoid arthritis as well.  DVT prophylaxis: Lovenox Code Status: Full code Family Communication: Niece present at bedside who is her caretaker as well.  Plan of care discussed with her and the patient.   Disposition Plan: Potential discharge tomorrow.  Estimated body mass index is 23.27 kg/m as calculated from the following:   Height as of this encounter: _0  (1.626 m).   Weight as of this encounter: 61.5 kg.      Nutritional status:               Consultants:   None  Procedures:    None  Antimicrobials:   None   Subjective: Seen and examined.  Continues to complain of body ache, more on the shoulders bilaterally.  Also right ankle pain.  Also some abdominal pain and intermittent nausea but no more diarrhea or vomiting.  Objective: Vitals:   05/30/19 1159 05/30/19 1501 05/30/19 2041 05/31/19 0432  BP: (!) 143/73 130/64 (!) 146/77 (!) 147/68  Pulse: (!) 102 87 87 90  Resp: (!) _1 Temp: 98.8 F (37.1 C) 98.1 F (36.7 C) 97.7 F (36.5 C) 98 F (36.7 C)  TempSrc: Axillary Oral Oral Oral  SpO2: 98% 96% 93% 97%  Weight: 61.5 kg     Height: _2  (1.626 m)       Intake/Output Summary (Last 24 hours) at 05/31/2019 1410 Last data filed at 05/31/2019 1230 Gross per 24 hour  Intake 120 ml  Output -  Net 120 ml   Filed Weights   05/30/19 1159  Weight: 61.5 kg    Examination:  General exam: Appears calm and comfortable  Respiratory system:  Clear to auscultation. Respiratory effort normal. Cardiovascular system: S1 & S2 heard, RRR. No JVD, murmurs, rubs, gallops or clicks. No pedal edema. Gastrointestinal system: Abdomen is nondistended, soft and nontender. No organomegaly or masses felt. Normal bowel sounds heard. Central nervous system: Alert and oriented. No focal neurological deficits. Extremities: Symmetric 5 x 5 power.  She has tenderness around both shoulders and at right ankle. Skin: No rashes, lesions or ulcers Psychiatry: Judgement and insight appear normal. Mood & affect appropriate.    Data Reviewed: I have personally reviewed following labs and imaging studies  CBC: Recent Labs  Lab 05/30/19 0005 05/31/19 0317  WBC 12.5* 9.4  HGB 12.8 10.0*  HCT 38.5 30.6*  MCV 90.2 90.5  PLT 357 865   Basic Metabolic Panel: Recent Labs  Lab 05/25/19 0746 05/30/19 0005 05/30/19 0716 05/30/19 1138 05/31/19 0317  NA 139 136  --  136 137  K 3.6 2.7*  --  3.6 3.4*  CL 109 99  --  104 107  CO2 21* 21*  --  17* 21*  GLUCOSE 75 132*   --  124* 82  BUN <5* <5*  --  6* 7*  CREATININE 0.80 0.98  --  0.84 0.86  CALCIUM 7.3* 8.5*  --  7.4* 7.7*  MG  --   --  0.9* 2.0 1.9   GFR: Estimated Creatinine Clearance: 44.3 mL/min (by C-G formula based on SCr of 0.86 mg/dL). Liver Function Tests: Recent Labs  Lab 05/30/19 0005  AST 37  ALT 27  ALKPHOS 37*  BILITOT 0.9  PROT 6.9  ALBUMIN 3.4*   Recent Labs  Lab 05/30/19 0005  LIPASE 148*   No results for input(s): AMMONIA in the last 168 hours. Coagulation Profile: No results for input(s): INR, PROTIME in the last 168 hours. Cardiac Enzymes: Recent Labs  Lab 05/30/19 1259  CKTOTAL 143   BNP (last 3 results) Recent Labs    06/08/18 1158  PROBNP 98   HbA1C: No results for input(s): HGBA1C in the last 72 hours. CBG: Recent Labs  Lab 05/30/19 1201 05/30/19 1718 05/30/19 2038 05/31/19 0740 05/31/19 1218  GLUCAP 122* 105* 94 100* 103*   Lipid Profile: Recent Labs    05/30/19 1259  CHOL 123  HDL 49  LDLCALC 58  TRIG 82  CHOLHDL 2.5   Thyroid Function Tests: No results for input(s): TSH, T4TOTAL, FREET4, T3FREE, THYROIDAB in the last 72 hours. Anemia Panel: No results for input(s): VITAMINB12, FOLATE, FERRITIN, TIBC, IRON, RETICCTPCT in the last 72 hours. Sepsis Labs: No results for input(s): PROCALCITON, LATICACIDVEN in the last 168 hours.  Recent Results (from the past 240 hour(s))  SARS CORONAVIRUS 2 (TAT 6-24 HRS)     Status: None   Collection Time: 05/30/19 11:27 AM  Result Value Ref Range Status   SARS Coronavirus 2 NEGATIVE NEGATIVE Final    Comment: (NOTE) SARS-CoV-2 target nucleic acids are NOT DETECTED. The SARS-CoV-2 RNA is generally detectable in upper and lower respiratory specimens during the acute phase of infection. Negative results do not preclude SARS-CoV-2 infection, do not rule out co-infections with other pathogens, and should not be used as the sole basis for treatment or other patient management decisions. Negative  results must be combined with clinical observations, patient history, and epidemiological information. The expected result is Negative. Fact Sheet for Patients: SugarRoll.be Fact Sheet for Healthcare Providers: https://www.woods-mathews.com/ This test is not yet approved or cleared by the [place] FDA and  has been authorized for detection  and/or diagnosis of SARS-CoV-2 by FDA under an Emergency Use Authorization (EUA). This EUA will remain  in effect (meaning this test can be used) for the duration of the COVID-19 declaration under Section 56 4(b)(1) of the Act, 21 U.S.C. section 360bbb-3(b)(1), unless the authorization is terminated or revoked sooner. Performed at [facility], [place] [address] [place], [place] [address]       Radiology Studies: Dg Chest Port 1 View  Result Date: 05/30/2019 CLINICAL DATA:  Shoulder and chest pain. EXAM: PORTABLE CHEST 1 VIEW COMPARISON:  05/19/2019 FINDINGS: Normal sized heart. Clear lungs. Diffuse osteopenia. Mild to moderate bilateral shoulder degenerative changes. IMPRESSION: No acute abnormality. Electronically Signed   By: Claudie Revering M.D.   On: 05/30/2019 05:32   Dg Foot Complete Right  Result Date: 05/31/2019 CLINICAL DATA:  [place] last week. Pain along the medial right foot near the heel. EXAM: RIGHT FOOT COMPLETE - 3+ VIEW COMPARISON:  None. FINDINGS: No fracture.  No bone lesion. Mild osteoarthritis at the first metatarsophalangeal joint. Medial first metatarsal head bony prominence consistent with a bunion. Mild hallux valgus deformity. Remaining joints normally spaced and aligned. Small plantar and dorsal calcaneal spurs. Soft tissues are unremarkable. IMPRESSION: 1. No fracture, dislocation or acute finding. Electronically Signed   By: Lajean Manes M.D.   On: 05/31/2019 11:26    Scheduled Meds: . atorvastatin  10 mg Oral q1800  . enoxaparin (LOVENOX) injection  40 mg Subcutaneous Q24H   . hydroxychloroquine  200 mg Oral BID  . insulin aspart  0-5 Units Subcutaneous QHS  . insulin aspart  0-9 Units Subcutaneous TID WC  . methylPREDNISolone  4 mg Oral Daily  . pantoprazole  40 mg Oral BID   Followed by  . [START ON 07/11/2019] pantoprazole  40 mg Oral Daily  . potassium chloride  40 mEq Oral Q4H  . sucralfate  1 g Oral TID WC & HS   Continuous Infusions:   LOS: 0 days   Time spent: 32 minutes   Darliss Cheney, MD [facility]  05/31/2019, 2:10 PM   To contact the attending provider between 7A-7P or the covering provider during after hours 7P-7A, please log into the web site [email or web address] and use password [email or web address].

## 2019-05-31 NOTE — Care Management Obs Status (Signed)
[facility] NOTIFICATION   Patient Details  Name: Lurae Langelier MRN: [id number] Date of Birth: May 31, 1936   Medicare Observation Status Notification Given:  Yes    Marilu Favre, RN 05/31/2019, 3:58 PM

## 2019-05-31 NOTE — Discharge Summary (Signed)
Physician Discharge Summary  Delinda Malan JSE:[id number] DOB: 02/14/1936 DOA: 05/29/2019  PCP: [name], PA-C  Admit date: 05/29/2019 Discharge date: 05/31/2019  Admitted From: Home Disposition: Home  Recommendations for Outpatient Follow-up:  1. Follow up with PCP in 1-2 weeks 2. Please obtain BMP/CBC in one week 3. Please follow up on the following pending results:  Home Health: Yes Equipment/Devices: None  Discharge Condition: Stable CODE STATUS: Full code Diet recommendation: Cardiac  Subjective: Seen and examined.  Feels much better.  Only some abdominal pain and intermittent nausea but no vomiting or diarrhea anymore.  Tolerated diet.  Brief/Interim Summary:   Following per HPI   "Vicki Sullivanis a 83 y.o.femalewith medical history significant ofchronic diastolic CHF, HTN, COPD, DM type II, and rheumatoid arthritis on chronic immunosuppressive therapy who presented from home with complaints of severe pain. Patient had just recently been hospitalized 10/21-10/28 for abdominal pain with nausea, vomiting, and diarrhea. Patient was seen by gastroenterology and had EGD performed. She had initially metSIRS criteria and had completed 3 days of cefepime. Patient's initial urinalysis was abnormal, but culture did not grow out any specific organism. Her acute kidney injury resolved with IV fluids(cR 4.91->0.8)and electrolyte abnormalities were corrected.Pathology from the recent EGD revealed gastric antral and oxyntic mucosa with focal active chronic gastritis which was negative for H. pylori, but no other significant abnormalities were appreciated on the other biopsies taken.  Since being home patient reported that she was initially doing well. She had been taking the Carafate 4 times daily as prescribed. She reports that the pharmacy never given a prescription for the Protonix and also were out of her 4 mg of Medrol tablets. Patient has been out of her  steroids for at least a week and a half. She reports it was issue insurance in regards to her diabetic medication that she was on and they were considering starting her on insulin. Over the last 1-2days she started developing severe pain in her neck that went into her shoulder,and then developed aching all over body. She reports that she still been having some intermittent nausea and vomiting, but diarrhea symptoms improved where she is only having 1-2 bowel movements per day.Associated symptoms include reports of subjective fevers. She lives at home with home niece normally helps her take care of her medicines,but she has been doing much of anything due to her pain symptoms. For the month   ED Course:Upon admission into the emergency department patient was seen to be afebrile, tachycardic, tachypneic, and all other vital signs maintained. Labs significant for WBC 12.5, potassium 2.7, and magnesium 0.9. Chest x-ray did not show any acute abnormalities. Patient was given 1 L normal saline IV fluids, 4 mg ofmethylprednisolone, 2 g of magnesium sulfate, total 50 mEq of potassium chloride, tramadol, and 100 mg of fentanyl. [facility] called to admit"  Patient was then admitted for nausea, vomiting and abdominal pain secondary to gastritis as well as pain all over the body secondary to acute flareup of rheumatoid arthritis.  She was started on prednisone for rheumatoid arthritis and also started on Carafate and omeprazole.  She then complained of some right ankle pain.  X-ray ruled out any fracture.  Her pain and aches of body was likely secondary to rheumatoid arthritis.  PT was consulted for her but she refused to be seen by them.  She was very adamant on not going to any rehab.  After some counseling, she was agreeable to have home health care.  PT was reconsulted and  they recommended home health which has been ordered for her.  For some unknown reasons, when she was discharged recently, she assumed  that she is supposed to start her omeprazole on December 8.  This might have caused her symptoms.  She continues to take Carafate.  I have prescribed her omeprazole to start from today.  She also told us that she was unable to get her prednisone from her pharmacy as they did not have prednisone in his stock.  I have also printed a prescription for prednisone for her so she can get it from any pharmacy that has it in stock.  She is being discharged in stable condition and she is agreeable with the plan.  Discharge Diagnoses:  Principal Problem:   Hypokalemia Active Problems:   Immunosuppression due to chronic steroid use   Rheumatoid arthritis (HCC)   DM (diabetes mellitus) (HCC)   Nausea, vomiting, and diarrhea   Hypomagnesemia   Gastritis   Elevated lipase   SIRS (systemic inflammatory response syndrome) [facility]: Lambert Rhonda W)    Discharge Instructions  Discharge Instructions    Discharge patient   Complete by: As directed    Discharge disposition: 06-Home-Health Care Svc   Discharge patient date: 05/31/2019     Allergies as of 05/31/2019   No Known Allergies     Medication List    TAKE these medications   albuterol 0.63 MG/3ML nebulizer solution Commonly known as: ACCUNEB Take 2 ampules by nebulization every 6 (six) hours as needed for wheezing.   aspirin EC 81 MG tablet Take 1 tablet (81 mg total) by mouth daily. What changed: when to take this   atorvastatin 10 MG tablet Commonly known as: Lipitor One qd x 1 month, then 2 qd if tolerated What changed:   how much to take  how to take this  when to take this  additional instructions   dicyclomine 20 MG tablet Commonly known as: BENTYL Take 20 mg by mouth 2 (two) times daily.   Gas-X Extra Strength 125 MG chewable tablet Generic drug: simethicone Chew 125 mg by mouth every 6 (six) hours as needed for flatulence.   hydroxychloroquine 200 MG tablet Commonly known as: PLAQUENIL Take 200 mg by mouth 2 (two) times daily.    hydrOXYzine 10 MG tablet Commonly known as: ATARAX/VISTARIL 1 q 8h prn itching What changed:   how much to take  how to take this  when to take this  reasons to take this  additional instructions   methylPREDNISolone 4 MG tablet Commonly known as: MEDROL Take 1 tablet (4 mg total) by mouth daily for 14 days.   OneTouch Verio test strip Generic drug: glucose blood Use to check blood sugar 3 to 4 times a day before meals   pantoprazole 40 MG tablet Commonly known as: PROTONIX Take 1 tablet (40 mg total) by mouth 2 (two) times daily. Take 1 tablet, 40 mg, by mouth 2 times daily x6 weeks then 1 tablet, 40 mg once daily indefinitely Start taking on: July 06, 2019 What changed: These instructions start on July 06, 2019. If you are unsure what to do until then, ask your doctor or other care provider.   Rybelsus 7 MG Tabs Generic drug: Semaglutide Take 1 tablet by mouth daily for 30 days, THEN 1 tablet daily. X 1 month, then 2 qd. Start taking on: May 06, 2019   sucralfate 1 GM/10ML suspension Commonly known as: CARAFATE Take 10 mLs (1 g total) by mouth 4 (four) times daily -  with meals and at bedtime.   traMADol 50 MG tablet Commonly known as: ULTRAM Take 1 tablet (50 mg total) by mouth every 12 (twelve) hours as needed for moderate pain.      Follow-up Information    Rodriguez-Southworth, Sunday Spillers, PA-C Follow up in 1 week(s).   Specialties: Internal Medicine, Emergency Medicine Contact information: [address] [place] [address] [phone]          No Known Allergies  Consultations: None   Procedures/Studies: Ct Abdomen Pelvis Wo Contrast  Result Date: 05/19/2019 CLINICAL DATA:  Pt presents with abd pain, N/V/D x2 daysAbd pain, diverticulitis suspected EXAM: CT ABDOMEN AND PELVIS WITHOUT CONTRAST TECHNIQUE: Multidetector CT imaging of the abdomen and pelvis was performed following the standard protocol without IV contrast.  COMPARISON:  CT 06/28/2018 FINDINGS: Lower chest: Mild linear atelectasis at the RIGHT lung base. Hepatobiliary: No focal hepatic lesion. Postcholecystectomy. No biliary dilatation. Pancreas: Pancreas is normal. No ductal dilatation. No pancreatic inflammation. Spleen: Normal spleen Adrenals/urinary tract: Adrenal glands and kidneys are normal. The ureters and bladder normal. Stomach/Bowel: Stomach, small-bowel and cecum normal. The ascending and transverse colon normal. Descending colon normal. No diverticulitis. Fluid in the rectum. Vascular/Lymphatic: Abdominal aorta is normal caliber with atherosclerotic calcification. There is no retroperitoneal or periportal lymphadenopathy. No pelvic lymphadenopathy. Reproductive: Post hysterectomy. Other: No free fluid. Musculoskeletal: Degenerative osteophytosis of the spine. Multiple levels of degenerative change spine. Augmentation lower thoracic spine. IMPRESSION: 1. No evidence of acute diverticulitis. 2. Fluid within the rectum. 3. No ureteral obstruction. Aortic Atherosclerosis (ICD10-I70.0). Electronically Signed   By: Suzy Bouchard M.D.   On: 05/19/2019 21:04   Ct Head Wo Contrast  Result Date: 05/19/2019 CLINICAL DATA:  Right upper extremity weakness EXAM: CT HEAD WITHOUT CONTRAST TECHNIQUE: Contiguous axial images were obtained from the base of the skull through the vertex without intravenous contrast. COMPARISON:  CT brain 05/18/2017 FINDINGS: Brain: No acute territorial infarction, hemorrhage, or intracranial mass. Atrophy and mild small vessel ischemic changes of the white matter. Stable ventricle size. Vascular: No hyperdense vessels.  Carotid vascular calcification Skull: Normal. Negative for fracture or focal lesion. Sinuses/Orbits: No acute finding. Old fracture medial wall left orbit. Other: None IMPRESSION: 1. No CT evidence for acute intracranial abnormality. 2. Atrophy and small vessel ischemic changes of the white matter Electronically Signed    By: Donavan Foil M.D.   On: 05/19/2019 21:01   Dg Chest Port 1 View  Result Date: 05/30/2019 CLINICAL DATA:  Shoulder and chest pain. EXAM: PORTABLE CHEST 1 VIEW COMPARISON:  05/19/2019 FINDINGS: Normal sized heart. Clear lungs. Diffuse osteopenia. Mild to moderate bilateral shoulder degenerative changes. IMPRESSION: No acute abnormality. Electronically Signed   By: Claudie Revering M.D.   On: 05/30/2019 05:32   Dg Chest Port 1 View  Result Date: 05/19/2019 CLINICAL DATA:  Chest pain EXAM: PORTABLE CHEST 1 VIEW COMPARISON:  Chest radiograph dated 06/08/2018. FINDINGS: The heart size and mediastinal contours are within normal limits. Both lungs are clear. Vertebroplasty changes are seen in the lower thoracic spine. Degenerative changes are seen in the shoulders. IMPRESSION: No active cardiopulmonary disease. Electronically Signed   By: Zerita Boers M.D.   On: 05/19/2019 19:48   Dg Foot Complete Right  Result Date: 05/31/2019 CLINICAL DATA:  [place] last week. Pain along the medial right foot near the heel. EXAM: RIGHT FOOT COMPLETE - 3+ VIEW COMPARISON:  None. FINDINGS: No fracture.  No bone lesion. Mild osteoarthritis at the first metatarsophalangeal joint. Medial first  metatarsal head bony prominence consistent with a bunion. Mild hallux valgus deformity. Remaining joints normally spaced and aligned. Small plantar and dorsal calcaneal spurs. Soft tissues are unremarkable. IMPRESSION: 1. No fracture, dislocation or acute finding. Electronically Signed   By: Lajean Manes M.D.   On: 05/31/2019 11:26      Discharge Exam: Vitals:   05/31/19 0432 05/31/19 1417  BP: (!) 147/68 (!) 148/80  Pulse: 90 (!) 108  Resp: 18 18  Temp: 98 F (36.7 C) 98.1 F (36.7 C)  SpO2: 97% 97%   Vitals:   05/30/19 1501 05/30/19 2041 05/31/19 0432 05/31/19 1417  BP: 130/64 (!) 146/77 (!) 147/68 (!) 148/80  Pulse: 87 87 90 (!) 108  Resp: _0 Temp: 98.1 F (36.7 C) 97.7 F (36.5 C) 98 F (36.7 C) 98.1 F  (36.7 C)  TempSrc: Oral Oral Oral Oral  SpO2: 96% 93% 97% 97%  Weight:      Height:        General: Pt is alert, awake, not in acute distress Cardiovascular: RRR, S1/S2 +, no rubs, no gallops Respiratory: CTA bilaterally, no wheezing, no rhonchi Abdominal: Soft, NT, ND, bowel sounds + Extremities: no edema, no cyanosis    The results of significant diagnostics from this hospitalization (including imaging, microbiology, ancillary and laboratory) are listed below for reference.     Microbiology: Recent Results (from the past 240 hour(s))  SARS CORONAVIRUS 2 (TAT 6-24 HRS)     Status: None   Collection Time: 05/30/19 11:27 AM  Result Value Ref Range Status   SARS Coronavirus 2 NEGATIVE NEGATIVE Final    Comment: (NOTE) SARS-CoV-2 target nucleic acids are NOT DETECTED. The SARS-CoV-2 RNA is generally detectable in upper and lower respiratory specimens during the acute phase of infection. Negative results do not preclude SARS-CoV-2 infection, do not rule out co-infections with other pathogens, and should not be used as the sole basis for treatment or other patient management decisions. Negative results must be combined with clinical observations, patient history, and epidemiological information. The expected result is Negative. Fact Sheet for Patients: SugarRoll.be Fact Sheet for Healthcare Providers: https://www.woods-mathews.com/ This test is not yet approved or cleared by the [place] FDA and  has been authorized for detection and/or diagnosis of SARS-CoV-2 by FDA under an Emergency Use Authorization (EUA). This EUA will remain  in effect (meaning this test can be used) for the duration of the COVID-19 declaration under Section 56 4(b)(1) of the Act, 21 U.S.C. section 360bbb-3(b)(1), unless the authorization is terminated or revoked sooner. Performed at [facility], [place] [address] [place], [place] [address]       Labs: BNP (last 3 results) No results for input(s): BNP in the last 8760 hours. Basic Metabolic Panel: Recent Labs  Lab 05/25/19 0746 05/30/19 0005 05/30/19 0716 05/30/19 1138 05/31/19 0317  NA 139 136  --  136 137  K 3.6 2.7*  --  3.6 3.4*  CL 109 99  --  104 107  CO2 21* 21*  --  17* 21*  GLUCOSE 75 132*  --  124* 82  BUN <5* <5*  --  6* 7*  CREATININE 0.80 0.98  --  0.84 0.86  CALCIUM 7.3* 8.5*  --  7.4* 7.7*  MG  --   --  0.9* 2.0 1.9   Liver Function Tests: Recent Labs  Lab 05/30/19 0005  AST 37  ALT 27  ALKPHOS 37*  BILITOT 0.9  PROT 6.9  ALBUMIN 3.4*   Recent Labs  Lab 05/30/19 0005  LIPASE 148*   No results for input(s): AMMONIA in the last 168 hours. CBC: Recent Labs  Lab 05/30/19 0005 05/31/19 0317  WBC 12.5* 9.4  HGB 12.8 10.0*  HCT 38.5 30.6*  MCV 90.2 90.5  PLT 357 305   Cardiac Enzymes: Recent Labs  Lab 05/30/19 1259  CKTOTAL 143   BNP: Invalid input(s): POCBNP CBG: Recent Labs  Lab 05/30/19 1201 05/30/19 1718 05/30/19 2038 05/31/19 0740 05/31/19 1218  GLUCAP 122* 105* 94 100* 103*   D-Dimer No results for input(s): DDIMER in the last 72 hours. Hgb A1c No results for input(s): HGBA1C in the last 72 hours. Lipid Profile Recent Labs    05/30/19 1259  CHOL 123  HDL 49  LDLCALC 58  TRIG 82  CHOLHDL 2.5   Thyroid function studies No results for input(s): TSH, T4TOTAL, T3FREE, THYROIDAB in the last 72 hours.  Invalid input(s): FREET3 Anemia work up No results for input(s): VITAMINB12, FOLATE, FERRITIN, TIBC, IRON, RETICCTPCT in the last 72 hours. Urinalysis    Component Value Date/Time   COLORURINE STRAW (A) 05/30/2019 1515   APPEARANCEUR CLEAR 05/30/2019 1515   LABSPEC 1.008 05/30/2019 1515   PHURINE 6.0 05/30/2019 1515   GLUCOSEU NEGATIVE 05/30/2019 1515   HGBUR NEGATIVE 05/30/2019 1515   BILIRUBINUR NEGATIVE 05/30/2019 1515   BILIRUBINUR negative 09/10/2018 1523   KETONESUR 20 (A) 05/30/2019 1515    PROTEINUR NEGATIVE 05/30/2019 1515   UROBILINOGEN 0.2 09/10/2018 1523   UROBILINOGEN 1.0 03/10/2015 1459   NITRITE NEGATIVE 05/30/2019 1515   LEUKOCYTESUR NEGATIVE 05/30/2019 1515   Sepsis Labs Invalid input(s): PROCALCITONIN,  WBC,  LACTICIDVEN Microbiology Recent Results (from the past 240 hour(s))  SARS CORONAVIRUS 2 (TAT 6-24 HRS)     Status: None   Collection Time: 05/30/19 11:27 AM  Result Value Ref Range Status   SARS Coronavirus 2 NEGATIVE NEGATIVE Final    Comment: (NOTE) SARS-CoV-2 target nucleic acids are NOT DETECTED. The SARS-CoV-2 RNA is generally detectable in upper and lower respiratory specimens during the acute phase of infection. Negative results do not preclude SARS-CoV-2 infection, do not rule out co-infections with other pathogens, and should not be used as the sole basis for treatment or other patient management decisions. Negative results must be combined with clinical observations, patient history, and epidemiological information. The expected result is Negative. Fact Sheet for Patients: SugarRoll.be Fact Sheet for Healthcare Providers: https://www.woods-mathews.com/ This test is not yet approved or cleared by the [place] FDA and  has been authorized for detection and/or diagnosis of SARS-CoV-2 by FDA under an Emergency Use Authorization (EUA). This EUA will remain  in effect (meaning this test can be used) for the duration of the COVID-19 declaration under Section 56 4(b)(1) of the Act, 21 U.S.C. section 360bbb-3(b)(1), unless the authorization is terminated or revoked sooner. Performed at [facility], [place] [address] [place], [place] [address]      Time coordinating discharge: Over 30 minutes  SIGNED:   Darliss Cheney, MD  [facility] 05/31/2019, 2:58 PM  If 7PM-7AM, please contact night-coverage [email or web address] Password [email or web address]

## 2019-06-03 ENCOUNTER — Inpatient Hospital Stay: Payer: Self-pay | Admitting: Internal Medicine

## 2019-06-03 ENCOUNTER — Telehealth: Payer: Self-pay

## 2019-06-03 NOTE — Telephone Encounter (Signed)
LVM for pt to call office about gap in care from [facility]. Is pt on any medication for her Rheumatoid arthritis  Pt would need to contact her rheumatologist to let them know there is a gap in care

## 2019-06-10 ENCOUNTER — Ambulatory Visit (INDEPENDENT_AMBULATORY_CARE_PROVIDER_SITE_OTHER): Payer: Medicare Other | Admitting: Internal Medicine

## 2019-06-10 ENCOUNTER — Other Ambulatory Visit: Payer: Self-pay

## 2019-06-10 VITALS — BP 121/62 | HR 128 | Temp 98.2°F | Ht 62.0 in | Wt 128.2 lb

## 2019-06-10 DIAGNOSIS — R110 Nausea: Secondary | ICD-10-CM | POA: Diagnosis not present

## 2019-06-10 DIAGNOSIS — Z09 Encounter for follow-up examination after completed treatment for conditions other than malignant neoplasm: Secondary | ICD-10-CM

## 2019-06-10 DIAGNOSIS — R531 Weakness: Secondary | ICD-10-CM

## 2019-06-10 DIAGNOSIS — E876 Hypokalemia: Secondary | ICD-10-CM

## 2019-06-10 DIAGNOSIS — R634 Abnormal weight loss: Secondary | ICD-10-CM

## 2019-06-10 MED ORDER — ONDANSETRON 4 MG PO TBDP: 4.0000 mg | ORAL_TABLET | Freq: Three times a day (TID) | ORAL | 0 refills | Status: DC | PRN

## 2019-06-10 MED ORDER — PROMETHAZINE HCL 25 MG/ML IJ SOLN
25.0000 mg | Freq: Once | INTRAMUSCULAR | Status: AC
  Administered 2019-06-10: 25 mg via INTRAMUSCULAR

## 2019-06-10 MED ORDER — TRAMADOL HCL 50 MG PO TABS: ORAL_TABLET | ORAL | 0 refills | Status: DC

## 2019-06-10 NOTE — Progress Notes (Signed)
Subjective:     Patient ID: [name] , female    DOB: 08-23-1935 , 83 y.o.   MRN: [id number]   Chief Complaint  Patient presents with  . Hospitalization Follow-up    HPI  Pt is here for hospital FU. DOA 10/21 for sepsis, hypomagnesimia and acute renal failure. She states her sickness started with what she thought it was a stomach virus. She could not stop vomiting, so ended up having an endoscopy. Today She  complains that she has severe nausea and she does not have anything for this. She had an endoscopy which showed 2 lesions and was biopsied and she had Fu with GI on 11/23. She has been taking her Protonix 40 mg qd, but was not prescribed anything for nausea. She is able to keep liquids down, but cant stand  Smells of food. Denies abdominal pain, gets her usual SOB with activity and gets some soreness in her sternal region. But is not heaviness.    2-She also needs her tramadol refilled. She is able to function taking this for her RA pain.   Past Medical History:  Diagnosis Date  . Acute diastolic heart failure ([place]) 06/15/2018  . Acute kidney injury ([place]) 01/22/2015  . Acute renal failure ([place])   . Acute respiratory failure with hypoxia ([place]) 06/14/2015  . Allergy   . Anemia    "when I was young"  . Anginal pain ([place])   . Anxiety   . Arthritis    "all over my body"   . Chest pain 04/07/2014  . Chronic bronchitis ([place])    "get it just about q yr" (06/14/2015)  . Chronic diastolic CHF (congestive heart failure) ([place])   . Chronic diastolic heart failure, NYHA class 1 ([place]) 01/22/2015  . Chronic lower back pain   . Chronic steroid use 12/11/2012  . Constipation 01/09/2017  . COPD (chronic obstructive pulmonary disease) ([place]) 01/09/2017  . Dehydration, moderate 12/11/2012  . 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 DM (diabetes mellitus) ([place]) 04/08/2014  . Dyspnea 08/10/2018  . Dysrhythmia    patient unsure what it is but has been told that.  . E-coli UTI 01/09/2017  . Elevated  diaphragm 06/15/2018  . Elevated lactic acid level 01/22/2015  . Essential hypertension 06/15/2018  . Exertional shortness of breath   . Frequent UTI    "recently" (06/14/2015)  . Gastric mass   . GERD (gastroesophageal reflux disease)   . Headache(784.0)    "weekly; wake up w/them " (110/19/2018)  . Heart murmur   . High cholesterol   . History of blood transfusion ~ 1954   "related to my periods"  . Hyperglycemia 12/11/2012  . Hypertension   . Intractable nausea and vomiting 05/15/2017  . Migraine    "once/month maybe" (05/16/2017)  . Multiple falls 06/14/2015  . Noncompliance 12/11/2012  . Orthostasis 06/14/2015  . OSA (obstructive sleep apnea)    "I think I'm using a BiPAP" (06/14/2015)  . Sepsis ([place]) 01/09/2017  . Sepsis secondary to UTI ([place])   . Syncope and collapse 12/11/2012  . Tachycardia with heart rate 100-120 beats per minute 04/08/2014  . Type II diabetes mellitus ([place])   . Uncontrolled type 2 diabetes mellitus with complication ([place])   . UTI (lower urinary tract infection) 12/11/2012  . Weight loss 06/14/2015     Family History  Problem Relation Age of Onset  . Hypertension Mother   . Heart attack Father   . Diabetes Mellitus II Sister   . Hypertension  Sister   . Cancer - Other Sister   . Migraines Niece   . Colon cancer Neg Hx      Current Outpatient Medications:  .  albuterol (ACCUNEB) 0.63 MG/3ML nebulizer solution, Take 2 ampules by nebulization every 6 (six) hours as needed for wheezing., Disp: , Rfl:  .  aspirin EC 81 MG tablet, Take 1 tablet (81 mg total) by mouth daily. (Patient taking differently: Take 81 mg by mouth 3 (three) times a week. ), Disp: , Rfl:  .  atorvastatin (LIPITOR) 10 MG tablet, One qd x 1 month, then 2 qd if tolerated (Patient taking differently: Take 10-20 mg by mouth See admin instructions. Take 10 mg by mouth once a day for 1 month, then increase to 20 mg once a day (if tolerated)), Disp: 90 tablet, Rfl: 0 .  dicyclomine  (BENTYL) 20 MG tablet, Take 20 mg by mouth 2 (two) times daily., Disp: , Rfl: 1 .  glucose blood (ONETOUCH VERIO) test strip, Use to check blood sugar 3 to 4 times a day before meals, Disp: 100 each, Rfl: 12 .  hydroxychloroquine (PLAQUENIL) 200 MG tablet, Take 200 mg by mouth 2 (two) times daily., Disp: , Rfl:  .  hydrOXYzine (ATARAX/VISTARIL) 10 MG tablet, 1 q 8h prn itching (Patient taking differently: Take 10 mg by mouth every 8 (eight) hours as needed for itching. ), Disp: 30 tablet, Rfl: 0 .  methylPREDNISolone (MEDROL) 4 MG tablet, Take 1 tablet (4 mg total) by mouth daily for 14 days., Disp: 14 tablet, Rfl: 0 .  [START ON 07/06/2019] pantoprazole (PROTONIX) 40 MG tablet, Take 1 tablet (40 mg total) by mouth 2 (two) times daily. Take 1 tablet, 40 mg, by mouth 2 times daily x6 weeks then 1 tablet, 40 mg once daily indefinitely, Disp: 120 tablet, Rfl: 0 .  Semaglutide (RYBELSUS) 7 MG TABS, Take 1 tablet by mouth daily for 30 days, THEN 1 tablet daily. X 1 month, then 2 qd., Disp: 90 tablet, Rfl: 0 .  simethicone (GAS-X EXTRA STRENGTH) 125 MG chewable tablet, Chew 125 mg by mouth every 6 (six) hours as needed for flatulence., Disp: , Rfl:  .  sucralfate (CARAFATE) 1 GM/10ML suspension, Take 10 mLs (1 g total) by mouth 4 (four) times daily -  with meals and at bedtime., Disp: 420 mL, Rfl: 0 .  traMADol (ULTRAM) 50 MG tablet, Take 1 tablet (50 mg total) by mouth every 12 (twelve) hours as needed for moderate pain., Disp: 20 tablet, Rfl: 0   No Known Allergies   Review of Systems  Constitutional: Positive for activity change, appetite change and fatigue. Negative for diaphoresis and fever.  HENT: Negative for congestion.   Respiratory: Positive for shortness of breath. Negative for cough, chest tightness and wheezing.   Cardiovascular: Positive for chest pain. Negative for leg swelling.  Gastrointestinal: Positive for nausea and vomiting. Negative for abdominal pain, blood in stool, constipation  and diarrhea.  Endocrine: Negative for polydipsia and polyphagia.  Genitourinary: Negative for difficulty urinating.  Musculoskeletal: Positive for arthralgias. Negative for gait problem and myalgias.  Skin: Negative for rash.  Neurological: Positive for weakness. Negative for tremors, facial asymmetry, light-headedness, numbness and headaches.    + for nausea, dry heaves, wt loss,  Denies coffee ground matter when she dry heaves, or blood or back stool.  Today's Vitals   06/10/19 1410  BP: 121/62  Pulse: (!) 128  Temp: 98.2 F (36.8 C)  TempSrc: Oral  Weight: 128 lb  3.2 oz (58.2 kg)  Height: 5\' 2"  (1.575 m)   Body mass index is 23.45 kg/m.   Objective:  Physical Exam Constitutional:      Appearance: She is ill-appearing. She is not toxic-appearing or diaphoretic.  HENT:     Head: Atraumatic.     Right Ear: External ear normal.     Left Ear: External ear normal.     Mouth/Throat:     Mouth: Mucous membranes are moist.  Eyes:     General: No scleral icterus.    Conjunctiva/sclera: Conjunctivae normal.  Neck:     Musculoskeletal: Neck supple.  Cardiovascular:     Rate and Rhythm: Regular rhythm. Tachycardia present.     Pulses: Normal pulses.  Pulmonary:     Effort: Pulmonary effort is normal.     Breath sounds: Normal breath sounds. No wheezing, rhonchi or rales.     Comments: Central mid sternum is tender to palpation Chest:     Chest wall: Tenderness present.  Abdominal:     General: Abdomen is flat. Bowel sounds are normal. There is no distension.     Palpations: Abdomen is soft. There is no mass.     Tenderness: There is no abdominal tenderness. There is no guarding or rebound.  Skin:    General: Skin is warm and dry.     Findings: No rash.  Neurological:     Mental Status: She is alert and oriented to person, place, and time.     Gait: Gait normal.  Psychiatric:        Mood and Affect: Mood normal.        Behavior: Behavior normal.        Thought  Content: Thought content normal.        Judgment: Judgment normal.    Assessment And Plan:     1. Hypokalemia- - CMP14 + Anion Gap  2. Weakness- due to poor nutrion - CBC no Diff  3. Nausea- persistent - promethazine (PHENERGAN) injection 25 mg - serum Magnesiumordered since it had been low when admited  4. Weight loss- secondary to illness.    She tried glucerna, but due to intolerance to milk she could not drink it.     I gave her Rx for Zofran ODT, and was given phenergan 25 mg Im here. Told if the nausea persisted, to go to ER tomorrow.  FU 1 week.  Her medications were reconciled and I reviewed her prior labs and endoscopy report.   Aya Geisel RODRIGUEZ-SOUTHWORTH, PA-C    THE PATIENT IS ENCOURAGED TO PRACTICE SOCIAL DISTANCING DUE TO THE COVID-19 PANDEMIC.

## 2019-06-11 ENCOUNTER — Telehealth: Payer: Self-pay | Admitting: Internal Medicine

## 2019-06-11 ENCOUNTER — Other Ambulatory Visit: Payer: Self-pay | Admitting: Internal Medicine

## 2019-06-11 ENCOUNTER — Encounter: Payer: Self-pay | Admitting: Internal Medicine

## 2019-06-11 LAB — CBC
Hematocrit: 39.3 % (ref 34.0–46.6)
Hemoglobin: 13 g/dL (ref 11.1–15.9)
MCH: 28.9 pg (ref 26.6–33.0)
MCHC: 33.1 g/dL (ref 31.5–35.7)
MCV: 87 fL (ref 79–97)
Platelets: 407 10*3/uL (ref 150–450)
RBC: 4.5 x10E6/uL (ref 3.77–5.28)
RDW: 12.6 % (ref 11.7–15.4)
WBC: 5.2 10*3/uL (ref 3.4–10.8)

## 2019-06-11 LAB — CMP14 + ANION GAP
ALT: 22 IU/L (ref 0–32)
AST: 35 IU/L (ref 0–40)
Albumin/Globulin Ratio: 1.6 (ref 1.2–2.2)
Albumin: 4.2 g/dL (ref 3.6–4.6)
Alkaline Phosphatase: 49 IU/L (ref 39–117)
Anion Gap: 16 mmol/L (ref 10.0–18.0)
BUN/Creatinine Ratio: 7 — ABNORMAL LOW (ref 12–28)
BUN: 10 mg/dL (ref 8–27)
Bilirubin Total: 0.4 mg/dL (ref 0.0–1.2)
CO2: 24 mmol/L (ref 20–29)
Calcium: 10.2 mg/dL (ref 8.7–10.3)
Chloride: 100 mmol/L (ref 96–106)
Creatinine, Ser: 1.44 mg/dL — ABNORMAL HIGH (ref 0.57–1.00)
GFR calc Af Amer: 39 mL/min/{1.73_m2} — ABNORMAL LOW (ref >59)
GFR calc non Af Amer: 34 mL/min/{1.73_m2} — ABNORMAL LOW (ref >59)
Globulin, Total: 2.7 g/dL (ref 1.5–4.5)
Glucose: 148 mg/dL — ABNORMAL HIGH (ref 65–99)
Potassium: 4.4 mmol/L (ref 3.5–5.2)
Sodium: 140 mmol/L (ref 134–144)
Total Protein: 6.9 g/dL (ref 6.0–8.5)

## 2019-06-11 LAB — MAGNESIUM: Magnesium: 1.4 mg/dL — ABNORMAL LOW (ref 1.6–2.3)

## 2019-06-11 MED ORDER — MAGNESIUM 400 MG PO TABS: ORAL_TABLET | ORAL | 0 refills | Status: DC

## 2019-06-11 NOTE — Progress Notes (Signed)
Magnesium rx sent to her pharmacy

## 2019-06-11 NOTE — Telephone Encounter (Signed)
I called pt and informed her about her labs, her nausea is improved and has been able to eat apple sauce and toast. The Zofran is helping. I told her I sent Magnesium to her pharmacy, but her Niece wont be back home from work til 10 pm, so cant start on it til tomorrow. So in the mean time, I reviewed foods rich in magnesium and she has some of them and will eat them today,

## 2019-06-15 ENCOUNTER — Ambulatory Visit: Payer: Self-pay

## 2019-06-15 DIAGNOSIS — E876 Hypokalemia: Secondary | ICD-10-CM

## 2019-06-15 DIAGNOSIS — IMO0002 Reserved for concepts with insufficient information to code with codable children: Secondary | ICD-10-CM

## 2019-06-15 DIAGNOSIS — E1165 Type 2 diabetes mellitus with hyperglycemia: Secondary | ICD-10-CM

## 2019-06-15 DIAGNOSIS — I10 Essential (primary) hypertension: Secondary | ICD-10-CM

## 2019-06-15 NOTE — Chronic Care Management (AMB) (Signed)
  Chronic Care Management   Outreach Note  06/15/2019 Name: [name] MRN: [id number] DOB: [birth year]  Referred by: [name], PA-C Reason for referral : Care Coordination   An unsuccessful telephone outreach was attempted today. The patient was referred to the case management team by for assistance with care management and care coordination.   Follow Up Plan: A HIPPA compliant phone message was left for the patient providing contact information and requesting a return call.  The care management team will reach out to the patient again over the next 14 days.   Daneen Schick, BSW, CDP Social Worker, Certified Dementia Practitioner [facility] [phone]

## 2019-06-17 ENCOUNTER — Other Ambulatory Visit: Payer: Self-pay

## 2019-06-17 ENCOUNTER — Ambulatory Visit (INDEPENDENT_AMBULATORY_CARE_PROVIDER_SITE_OTHER): Payer: Medicare Other | Admitting: Internal Medicine

## 2019-06-17 DIAGNOSIS — R7989 Other specified abnormal findings of blood chemistry: Secondary | ICD-10-CM

## 2019-06-17 DIAGNOSIS — R112 Nausea with vomiting, unspecified: Secondary | ICD-10-CM

## 2019-06-17 MED ORDER — PROMETHAZINE HCL 25 MG PO TABS: 25.0000 mg | ORAL_TABLET | Freq: Four times a day (QID) | ORAL | 0 refills | Status: DC | PRN

## 2019-06-17 MED ORDER — PROMETHAZINE HCL 25 MG/ML IJ SOLN
25.0000 mg | Freq: Once | INTRAMUSCULAR | Status: AC
  Administered 2019-06-17: 15:00:00 25 mg via INTRAMUSCULAR

## 2019-06-17 NOTE — Progress Notes (Addendum)
Subjective:     Patient ID: [name] , female    DOB: 04-Jul-1936 , 84 y.o.   MRN: [id number]   Chief Complaint  Patient presents with  . Dizziness    HPI She is here for Fu N/V and wt check. The Zofran 4 mg helped a little and she would only vomit every other day. On the days she was not nauseous and is able to eat a little. There are times she is not as nauseous for a few hours and is able to eat.    Past Medical History:  Diagnosis Date  . Acute diastolic heart failure ([place]) 06/15/2018  . Acute kidney injury ([place]) 01/22/2015  . Acute renal failure ([place])   . Acute respiratory failure with hypoxia ([place]) 06/14/2015  . Allergy   . Anemia    "when I was young"  . Anginal pain ([place])   . Anxiety   . Arthritis    "all over my body"   . Chest pain 04/07/2014  . Chronic bronchitis ([place])    "get it just about q yr" (06/14/2015)  . Chronic diastolic CHF (congestive heart failure) ([place])   . Chronic diastolic heart failure, NYHA class 1 ([place]) 01/22/2015  . Chronic lower back pain   . Chronic steroid use 12/11/2012  . Constipation 01/09/2017  . COPD (chronic obstructive pulmonary disease) ([place]) 01/09/2017  . Dehydration, moderate 12/11/2012  . 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 DM (diabetes mellitus) ([place]) 04/08/2014  . Dyspnea 08/10/2018  . Dysrhythmia    patient unsure what it is but has been told that.  . E-coli UTI 01/09/2017  . 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 Elevated lactic acid level 01/22/2015  . Essential hypertension 06/15/2018  . Exertional shortness of breath   . Frequent UTI    "recently" (06/14/2015)  . Gastric mass   . GERD (gastroesophageal reflux disease)   . Headache(784.0)    "weekly; wake up w/them " (110/19/2018)  . Heart murmur   . High cholesterol   . History of blood transfusion ~ 1954   "related to my periods"  . Hyperglycemia 12/11/2012  . Hypertension   . Intractable nausea and vomiting 05/15/2017  . Migraine    "once/month maybe" (05/16/2017)   . Multiple falls 06/14/2015  . Noncompliance 12/11/2012  . Orthostasis 06/14/2015  . OSA (obstructive sleep apnea)    "I think I'm using a BiPAP" (06/14/2015)  . Sepsis ([place]) 01/09/2017  . Sepsis secondary to UTI ([place])   . Syncope and collapse 12/11/2012  . Tachycardia with heart rate 100-120 beats per minute 04/08/2014  . Type II diabetes mellitus ([place])   . Uncontrolled type 2 diabetes mellitus with complication ([place])   . UTI (lower urinary tract infection) 12/11/2012  . Weight loss 06/14/2015     Family History  Problem Relation Age of Onset  . Hypertension Mother   . Heart attack Father   . Diabetes Mellitus II Sister   . Hypertension Sister   . Cancer - Other Sister   . Migraines Niece   . Colon cancer Neg Hx      Current Outpatient Medications:  .  albuterol (ACCUNEB) 0.63 MG/3ML nebulizer solution, Take 2 ampules by nebulization every 6 (six) hours as needed for wheezing., Disp: , Rfl:  .  aspirin EC 81 MG tablet, Take 1 tablet (81 mg total) by mouth daily. (Patient taking differently: Take 81 mg by mouth 3 (three) times a week. ), Disp: , Rfl:  .  atorvastatin (LIPITOR) 10  MG tablet, One qd x 1 month, then 2 qd if tolerated (Patient taking differently: Take 10-20 mg by mouth See admin instructions. Take 10 mg by mouth once a day for 1 month, then increase to 20 mg once a day (if tolerated)), Disp: 90 tablet, Rfl: 0 .  dicyclomine (BENTYL) 20 MG tablet, Take 20 mg by mouth 2 (two) times daily., Disp: , Rfl: 1 .  glucose blood (ONETOUCH VERIO) test strip, Use to check blood sugar 3 to 4 times a day before meals, Disp: 100 each, Rfl: 12 .  hydroxychloroquine (PLAQUENIL) 200 MG tablet, Take 200 mg by mouth 2 (two) times daily., Disp: , Rfl:  .  hydrOXYzine (ATARAX/VISTARIL) 10 MG tablet, 1 q 8h prn itching (Patient taking differently: Take 10 mg by mouth every 8 (eight) hours as needed for itching. ), Disp: 30 tablet, Rfl: 0 .  Magnesium 400 MG TABS, Take 400 mg by mouth every  morning for 30 days, THEN 400 mg every morning., Disp: 30 tablet, Rfl: 0 .  ondansetron (ZOFRAN ODT) 4 MG disintegrating tablet, Take 1 tablet (4 mg total) by mouth every 8 (eight) hours as needed for nausea or vomiting., Disp: 30 tablet, Rfl: 0 .  [START ON 07/06/2019] pantoprazole (PROTONIX) 40 MG tablet, Take 1 tablet (40 mg total) by mouth 2 (two) times daily. Take 1 tablet, 40 mg, by mouth 2 times daily x6 weeks then 1 tablet, 40 mg once daily indefinitely, Disp: 120 tablet, Rfl: 0 .  simethicone (GAS-X EXTRA STRENGTH) 125 MG chewable tablet, Chew 125 mg by mouth every 6 (six) hours as needed for flatulence., Disp: , Rfl:  .  sucralfate (CARAFATE) 1 GM/10ML suspension, Take 10 mLs (1 g total) by mouth 4 (four) times daily -  with meals and at bedtime., Disp: 420 mL, Rfl: 0 .  traMADol (ULTRAM) 50 MG tablet, One qhs prn pain, Disp: 20 tablet, Rfl: 0 .  promethazine (PHENERGAN) 25 MG tablet, Take 1 tablet (25 mg total) by mouth every 6 (six) hours as needed for nausea or vomiting., Disp: 30 tablet, Rfl: 0  Current Facility-Administered Medications:  .  promethazine (PHENERGAN) injection 25 mg, 25 mg, Intramuscular, Once, Rodriguez-Southworth, Sunday Spillers, PA-C   No Known Allergies   Review of Systems  + nausea, light headness with position, decreased appetite, wt loss,  negative for SOB, chest pain or tightness, abdominal pain, blood in stool or black stools.  Today's Vitals   06/17/19 1424  Temp: 97.6 F (36.4 C)  TempSrc: Oral  Weight: 125 lb 12.8 oz (57.1 kg)  Height: 5\' 2"  (1.575 m)   Body mass index is 23.01 kg/m.   Objective:  Physical Exam Vitals signs and nursing note reviewed.  Constitutional:      Appearance: She is ill-appearing. She is not toxic-appearing or diaphoretic.  HENT:     Head: Atraumatic.     Right Ear: External ear normal.     Left Ear: External ear normal.     Mouth/Throat:     Mouth: Mucous membranes are moist.  Eyes:     General: No scleral icterus.     Conjunctiva/sclera: Conjunctivae normal.  Neck:     Musculoskeletal: Neck supple.  Cardiovascular:     Rate and Rhythm: Regular rhythm.  Pulmonary:     Effort: Pulmonary effort is normal.     Breath sounds: Normal breath sounds.  Abdominal:     General: Bowel sounds are normal. There is no distension.     Palpations: Abdomen  is soft. There is no mass.     Tenderness: There is no abdominal tenderness. There is no guarding or rebound.     Hernia: No hernia is present.  Neurological:     Mental Status: She is alert.  Psychiatric:        Mood and Affect: Mood normal.        Behavior: Behavior normal.        Thought Content: Thought content normal.        Judgment: Judgment normal.     Assessment And Plan:     1. Hypomagnesemia-unknown status. She had it while in the hospital but was normal at discharge. - Magnesium  2. Elevated serum creatinine- unknown status.  Was elevated while at the hospital. - BMP8+Anion Gap  3. Non-intractable vomiting with nausea, unspecified vomiting type- with minimal improvement and still loosing wt. Her lipase was high when she was at the hospital. I will have her try phenergan and warned her about the sedation and cautioned her about falls.  - Amylase - Lipase - promethazine (PHENERGAN) injection 25 mg FU in 2 weeks Dail Meece RODRIGUEZ-SOUTHWORTH, PA-C    THE PATIENT IS ENCOURAGED TO PRACTICE SOCIAL DISTANCING DUE TO THE COVID-19 PANDEMIC.

## 2019-06-17 NOTE — Patient Instructions (Addendum)
Take the Phenergan or promethazine if the Zofran 4 mg 2 at a time every 8 hours does not help your nausea.  If the phenergan is making you too sleepy, risking a fall, then only take it at bed time, or when your niece is home.  Switch you stomach medicine, the Protonix 40 mg, to bed time and see if this help you not wake up nauseous.

## 2019-06-18 ENCOUNTER — Ambulatory Visit (INDEPENDENT_AMBULATORY_CARE_PROVIDER_SITE_OTHER): Payer: Medicare Other | Admitting: Gastroenterology

## 2019-06-18 ENCOUNTER — Encounter: Payer: Self-pay | Admitting: Gastroenterology

## 2019-06-18 VITALS — BP 102/58 | HR 105 | Temp 96.7°F | Ht 62.0 in | Wt 125.2 lb

## 2019-06-18 DIAGNOSIS — R6881 Early satiety: Secondary | ICD-10-CM | POA: Diagnosis not present

## 2019-06-18 DIAGNOSIS — R634 Abnormal weight loss: Secondary | ICD-10-CM

## 2019-06-18 DIAGNOSIS — R112 Nausea with vomiting, unspecified: Secondary | ICD-10-CM | POA: Diagnosis not present

## 2019-06-18 LAB — BMP8+ANION GAP
Anion Gap: 19 mmol/L — ABNORMAL HIGH (ref 10.0–18.0)
BUN/Creatinine Ratio: 8 — ABNORMAL LOW (ref 12–28)
BUN: 9 mg/dL (ref 8–27)
CO2: 24 mmol/L (ref 20–29)
Calcium: 10.1 mg/dL (ref 8.7–10.3)
Chloride: 101 mmol/L (ref 96–106)
Creatinine, Ser: 1.17 mg/dL — ABNORMAL HIGH (ref 0.57–1.00)
GFR calc Af Amer: 50 mL/min/{1.73_m2} — ABNORMAL LOW (ref >59)
GFR calc non Af Amer: 44 mL/min/{1.73_m2} — ABNORMAL LOW (ref >59)
Glucose: 129 mg/dL — ABNORMAL HIGH (ref 65–99)
Potassium: 4.5 mmol/L (ref 3.5–5.2)
Sodium: 144 mmol/L (ref 134–144)

## 2019-06-18 LAB — AMYLASE: Amylase: 113 U/L — ABNORMAL HIGH (ref 31–110)

## 2019-06-18 LAB — LIPASE: Lipase: 69 U/L (ref 14–85)

## 2019-06-18 LAB — MAGNESIUM: Magnesium: 1.4 mg/dL — ABNORMAL LOW (ref 1.6–2.3)

## 2019-06-18 MED ORDER — METOCLOPRAMIDE HCL 5 MG PO TABS: 5.0000 mg | ORAL_TABLET | Freq: Three times a day (TID) | ORAL | 1 refills | Status: DC

## 2019-06-18 NOTE — Progress Notes (Signed)
HPI :  83 year old female here for follow-up visit.  I know her from an EGD I performed for her in 2019 for epigastric pain and bloating.  She has a history of OSA, rheumatoid arthritis, diabetes, GERD.  History of cholecystectomy.  History obtained from the patient, and her family present with her today.  She was hospitalized from October 21 to October 28 with worsening dizziness and weakness.  She also noted nausea, vomiting and abdominal pain.  She was given empiric antibiotics, there was pyuria on UA but negative urine culture.  Her CT abdomen pelvis did not reveal any clear source of infection.  She had a mildly elevated lipase but no evidence of pancreatitis on CT scan.  She underwent an endoscopy with Dr. Rush Landmark on 05/25/19 which showed a stable subepithelial gastric lesion thought to be benign, she is had an extensive work-up for that in the past including EUS.  She had some gastric nodules which were biopsied and negative, negative testing for H. pylori.  She also had negative biopsies of the small bowel and esophagus.  She unfortunately was readmitted to the hospital from October 31 to November 2.  At that time she presented with severe pain diffusely.  It was learned that she had run out of her Protonix and had run out of her Medrol tablets which she takes chronically for her rheumatoid arthritis.  These were resumed and she felt better and was discharged.   She returns today with ongoing symptoms of nausea and vomiting.  She states for the past 3 weeks her p.o. intake has been poor, she is not doing well with solids, does better with liquids although is frequently nauseated and has had intermittent vomiting.  She states she feels full easily with early satiety.  She denies any abdominal pain at this point, her previous pain is since resolved.  She thinks she is lost 15 pounds in the past few months.  She had been taking Zofran as needed which has not helped much, she was recently given  Phenergan and took 1 dose yesterday which did not seem to help too much.  She was seen by her primary care physician yesterday and had labs done showing normal kidney function and electrolytes, no evidence of dehydration.  She is tolerating liquids and urinating okay.  She is mildly tachycardic in the clinic today.  She denies any new medications.  She states she is taking her Medrol for her rheumatoid arthritis but is unsure of the dose and is not listed on her medications today.  She does have a history of diabetes, last hemoglobin A1c is 7.9 but she is not taking any medications for this.  She is watching her glucose levels at home.  On review of her chart she did have a remote gastric emptying study in 2004 which showed evidence of gastroparesis.  She states she think she remembers being told she may have had this but has not been on Reglan in the past that she is aware or specific treatment for this.   Endoscopic history: 2006 colonoscopy by Dr. Vladimir Faster.  2, tubular adenomatous, polyps removed. 08/2017 colonoscopy.  Dr Havery Moros. For weight loss and surveillance of adenomatous polyps.  Flat, 10 mm cecal polyp.  three, 4 to 6 mm, sessile polyps in transverse colon.  Transverse and sigmoid diverticulosis.  Internal hemorrhoids.  Pathology on the polyps were adenomatous, no H GD. 08/2017 EGD.  Subepithelial gastric nodule/mass not bleeding and no stigmata of recent bleeding in the  gastric fundus.  Biopsied (fundic gland polyp).  Small subepithelial nodules in the gastric body, biopsied (gastric, antral and oxyntic mucosa with no specific histopathologic changes.  No H. Pylori).   09/2017 Upper EUS.  For evaluation of gastric nodule:  2.3 cm, subepithelial lesion in proximal stomach correlates with densely calcified lesion that appears to involve the wall of the stomach.  No evident associated soft tissue involvement.  Otherwise normal gastric wall.  Limited views of liver, pancreas, spleen all normal.  The  subepithelial lesion correlates with a heavily calcified lesion in the proximal gastric wall that has been present by imaging since at least 2007 and has not changed significantly.     EGD 05/25/19 - No gross mucosal lesions in esophagus. Tortuous esophagus. Z-line irregular, 36 cm from the incisors. 1 cm hiatal hernia. - Submucosal lesion in the cardia, previously tunnel biopsied and EUS performed by partner and present on CT Imaging without significant change for >13-years. - Two submucosal papules (nodules) found in the stomach. Tunnel biopsied the larger of these lesions.. - Erythematous mucosa in the stomach. Biopsied for HP. - No gross lesions in the duodenal bulb, in the first portion of the duodenum and in the second portion of the duodenum. Biopsied for enteropathy rule out.    Past Medical History:  Diagnosis Date   Acute diastolic heart failure ([place]) 06/15/2018   Acute kidney injury ([place]) 01/22/2015   Acute renal failure ([place])    Acute respiratory failure with hypoxia ([place]) 06/14/2015   Allergy    Anemia    "when I was young"   Anginal pain ([place])    Anxiety    Arthritis    "all over my body"    Chest pain 04/07/2014   Chronic bronchitis ([place])    "get it just about q yr" (06/14/2015)   Chronic diastolic CHF (congestive heart failure) ([facility])    Chronic diastolic heart failure, NYHA class 1 ([place]) 01/22/2015   Chronic lower back pain    Chronic steroid use 12/11/2012   Constipation 01/09/2017   COPD (chronic obstructive pulmonary disease) ([place]) 01/09/2017   Dehydration, moderate 12/11/2012   Disequilibrium syndrome 01/22/2015   DM (diabetes mellitus) ([place]) 04/08/2014   Dyspnea 08/10/2018   Dysrhythmia    patient unsure what it is but has been told that.   E-coli UTI 01/09/2017   Elevated diaphragm 06/15/2018   Elevated lactic acid level 01/22/2015   Essential hypertension 06/15/2018   Exertional shortness of breath    Frequent UTI    "recently"  (06/14/2015)   Gastric mass    GERD (gastroesophageal reflux disease)    Headache(784.0)    "weekly; wake up w/them " (110/19/2018)   Heart murmur    High cholesterol    History of blood transfusion ~ 1954   "related to my periods"   Hyperglycemia 12/11/2012   Hypertension    Intractable nausea and vomiting 05/15/2017   Migraine    "once/month maybe" (05/16/2017)   Multiple falls 06/14/2015   Noncompliance 12/11/2012   Orthostasis 06/14/2015   OSA (obstructive sleep apnea)    "I think I'm using a BiPAP" (06/14/2015)   Sepsis ([place]) 01/09/2017   Sepsis secondary to UTI ([place])    Syncope and collapse 12/11/2012   Tachycardia with heart rate 100-120 beats per minute 04/08/2014   Type II diabetes mellitus ([place] [address])    Uncontrolled type 2 diabetes mellitus with complication ([place])    UTI (lower urinary tract infection) 12/11/2012   Weight loss 06/14/2015  Past Surgical History:  Procedure Laterality Date   APPENDECTOMY  1970   /notes 12/12/2010   BACK SURGERY     BIOPSY  05/25/2019   Procedure: BIOPSY;  Surgeon: Rush Landmark Telford Nab., MD;  Location: [facility] ENDOSCOPY;  Service: Gastroenterology;;   [place]   Archie Endo 12/12/2010   ESOPHAGOGASTRODUODENOSCOPY N/A 05/25/2019   Procedure: ESOPHAGOGASTRODUODENOSCOPY (EGD);  Surgeon: Irving Copas., MD;  Location: [place];  Service: Gastroenterology;  Laterality: N/A;   EUS N/A 10/16/2017   Procedure: UPPER ENDOSCOPIC ULTRASOUND (EUS) RADIAL;  Surgeon: Milus Banister, MD;  Location: [facility] ENDOSCOPY;  Service: Endoscopy;  Laterality: N/A;   FRACTURE SURGERY     LAPAROSCOPIC CHOLECYSTECTOMY  1990's   LUMBAR MICRODISCECTOMY Left 09/2006   L5-S1/notes 12/12/2010   MULTIPLE EXTRACTIONS WITH ALVEOLOPLASTY Bilateral 06/13/2017   Procedure: MULTIPLE EXTRACTION WITH ALVEOLOPLASTY;  Surgeon: Diona Browner, DDS;  Location: [facility];  Service: Oral Surgery;  Laterality: Bilateral;   MULTIPLE  TOOTH EXTRACTIONS     ORIF SHOULDER FRACTURE Left ~ 2009   "put cement down in it"    TONSILLECTOMY  1950's?   TOTAL ABDOMINAL HYSTERECTOMY  1979   Archie Endo 12/12/2010   Family History  Problem Relation Age of Onset   Hypertension Mother    Heart attack Father    Diabetes Mellitus II Sister    Hypertension Sister    Cancer - Other Sister    Migraines Niece    Colon cancer Neg Hx    Social History   Tobacco Use   Smoking status: Former Smoker    Types: Cigarettes   Smokeless tobacco: Never Used   Tobacco comment: 05/16/2017"1 pack of cigarettes would last 3-4 months; haven't had any cigarettes for 40 years"  Substance Use Topics   Alcohol use: Not Currently    Frequency: Never    Comment: 05/16/2017 "used to drink beer in the 1990's; don't drink at all now"   Drug use: No   Current Outpatient Medications  Medication Sig Dispense Refill   albuterol (ACCUNEB) 0.63 MG/3ML nebulizer solution Take 2 ampules by nebulization every 6 (six) hours as needed for wheezing.     aspirin EC 81 MG tablet Take 1 tablet (81 mg total) by mouth daily. (Patient taking differently: Take 81 mg by mouth 3 (three) times a week. )     atorvastatin (LIPITOR) 10 MG tablet One qd x 1 month, then 2 qd if tolerated (Patient taking differently: Take 10-20 mg by mouth See admin instructions. Take 10 mg by mouth once a day for 1 month, then increase to 20 mg once a day (if tolerated)) 90 tablet 0   dicyclomine (BENTYL) 20 MG tablet Take 20 mg by mouth 2 (two) times daily.  1   glucose blood (ONETOUCH VERIO) test strip Use to check blood sugar 3 to 4 times a day before meals 100 each 12   hydroxychloroquine (PLAQUENIL) 200 MG tablet Take 200 mg by mouth 2 (two) times daily.     hydrOXYzine (ATARAX/VISTARIL) 10 MG tablet 1 q 8h prn itching (Patient taking differently: Take 10 mg by mouth every 8 (eight) hours as needed for itching. ) 30 tablet 0   Magnesium 400 MG TABS Take 400 mg by mouth  every morning for 30 days, THEN 400 mg every morning. 30 tablet 0   methylPREDNISolone (MEDROL) 4 MG tablet Take 4 mg by mouth daily.     ondansetron (ZOFRAN ODT) 4 MG disintegrating tablet Take 1 tablet (4 mg total)  by mouth every 8 (eight) hours as needed for nausea or vomiting. 30 tablet 0   [START ON 07/06/2019] pantoprazole (PROTONIX) 40 MG tablet Take 1 tablet (40 mg total) by mouth 2 (two) times daily. Take 1 tablet, 40 mg, by mouth 2 times daily x6 weeks then 1 tablet, 40 mg once daily indefinitely 120 tablet 0   promethazine (PHENERGAN) 25 MG tablet Take 1 tablet (25 mg total) by mouth every 6 (six) hours as needed for nausea or vomiting. 30 tablet 0   simethicone (GAS-X EXTRA STRENGTH) 125 MG chewable tablet Chew 125 mg by mouth every 6 (six) hours as needed for flatulence.     sucralfate (CARAFATE) 1 GM/10ML suspension Take 10 mLs (1 g total) by mouth 4 (four) times daily -  with meals and at bedtime. 420 mL 0   traMADol (ULTRAM) 50 MG tablet One qhs prn pain 20 tablet 0   metoCLOPramide (REGLAN) 5 MG tablet Take 1-2 tablets (5-10 mg total) by mouth 3 (three) times daily before meals. 90 tablet 1   No current facility-administered medications for this visit.    No Known Allergies   Review of Systems: All systems reviewed and negative except where noted in HPI.    Ct Abdomen Pelvis Wo Contrast  Result Date: 05/19/2019 CLINICAL DATA:  Pt presents with abd pain, N/V/D x2 daysAbd pain, diverticulitis suspected EXAM: CT ABDOMEN AND PELVIS WITHOUT CONTRAST TECHNIQUE: Multidetector CT imaging of the abdomen and pelvis was performed following the standard protocol without IV contrast. COMPARISON:  CT 06/28/2018 FINDINGS: Lower chest: Mild linear atelectasis at the RIGHT lung base. Hepatobiliary: No focal hepatic lesion. Postcholecystectomy. No biliary dilatation. Pancreas: Pancreas is normal. No ductal dilatation. No pancreatic inflammation. Spleen: Normal spleen Adrenals/urinary  tract: Adrenal glands and kidneys are normal. The ureters and bladder normal. Stomach/Bowel: Stomach, small-bowel and cecum normal. The ascending and transverse colon normal. Descending colon normal. No diverticulitis. Fluid in the rectum. Vascular/Lymphatic: Abdominal aorta is normal caliber with atherosclerotic calcification. There is no retroperitoneal or periportal lymphadenopathy. No pelvic lymphadenopathy. Reproductive: Post hysterectomy. Other: No free fluid. Musculoskeletal: Degenerative osteophytosis of the spine. Multiple levels of degenerative change spine. Augmentation lower thoracic spine. IMPRESSION: 1. No evidence of acute diverticulitis. 2. Fluid within the rectum. 3. No ureteral obstruction. Aortic Atherosclerosis (ICD10-I70.0). Electronically Signed   By: Suzy Bouchard M.D.   On: 05/19/2019 21:04   Ct Head Wo Contrast  Result Date: 05/19/2019 CLINICAL DATA:  Right upper extremity weakness EXAM: CT HEAD WITHOUT CONTRAST TECHNIQUE: Contiguous axial images were obtained from the base of the skull through the vertex without intravenous contrast. COMPARISON:  CT brain 05/18/2017 FINDINGS: Brain: No acute territorial infarction, hemorrhage, or intracranial mass. Atrophy and mild small vessel ischemic changes of the white matter. Stable ventricle size. Vascular: No hyperdense vessels.  Carotid vascular calcification Skull: Normal. Negative for fracture or focal lesion. Sinuses/Orbits: No acute finding. Old fracture medial wall left orbit. Other: None IMPRESSION: 1. No CT evidence for acute intracranial abnormality. 2. Atrophy and small vessel ischemic changes of the white matter Electronically Signed   By: Donavan Foil M.D.   On: 05/19/2019 21:01   Dg Chest Port 1 View  Result Date: 05/30/2019 CLINICAL DATA:  Shoulder and chest pain. EXAM: PORTABLE CHEST 1 VIEW COMPARISON:  05/19/2019 FINDINGS: Normal sized heart. Clear lungs. Diffuse osteopenia. Mild to moderate bilateral shoulder  degenerative changes. IMPRESSION: No acute abnormality. Electronically Signed   By: Claudie Revering M.D.   On: 05/30/2019 05:32  Dg Chest Port 1 View  Result Date: 05/19/2019 CLINICAL DATA:  Chest pain EXAM: PORTABLE CHEST 1 VIEW COMPARISON:  Chest radiograph dated 06/08/2018. FINDINGS: The heart size and mediastinal contours are within normal limits. Both lungs are clear. Vertebroplasty changes are seen in the lower thoracic spine. Degenerative changes are seen in the shoulders. IMPRESSION: No active cardiopulmonary disease. Electronically Signed   By: Zerita Boers M.D.   On: 05/19/2019 19:48   Dg Foot Complete Right  Result Date: 05/31/2019 CLINICAL DATA:  [place] last week. Pain along the medial right foot near the heel. EXAM: RIGHT FOOT COMPLETE - 3+ VIEW COMPARISON:  None. FINDINGS: No fracture.  No bone lesion. Mild osteoarthritis at the first metatarsophalangeal joint. Medial first metatarsal head bony prominence consistent with a bunion. Mild hallux valgus deformity. Remaining joints normally spaced and aligned. Small plantar and dorsal calcaneal spurs. Soft tissues are unremarkable. IMPRESSION: 1. No fracture, dislocation or acute finding. Electronically Signed   By: Lajean Manes M.D.   On: 05/31/2019 11:26    Physical Exam: BP (!) 102/58    Pulse (!) 105    Temp (!) 96.7 F (35.9 C)    Ht 5\' 2"  (1.575 m)    Wt 125 lb 3.2 oz (56.8 kg)    BMI 22.90 kg/m  Constitutional: Pleasant, frail, female in no acute distress, in wheelchair. HEENT: Normocephalic and atraumatic. Conjunctivae are normal. No scleral icterus. Neck supple.  Cardiovascular: mildly tachycardic, regular rhythm.  Pulmonary/chest: Effort normal and breath sounds normal.  Abdominal: Soft, nondistended, nontender. there are no masses palpable. No hepatomegaly. Extremities: no edema Lymphadenopathy: No cervical adenopathy noted. Neurological: Alert and oriented to person place and time. Skin: Skin is warm and dry. No rashes  noted. Psychiatric: Normal mood and affect. Behavior is normal.   ASSESSMENT AND PLAN: 83 year old female here for reassessment of the following  Nausea and vomiting / early satiety / weight loss - she has not felt well for a few months now, she had 2 hospitalizations as outlined above, she continues to have poor p.o. intake with early satiety and frequent nausea and occasional vomiting.  Labs yesterday show that she is not dehydrated but clearly she is not eating enough.  She is had a few EGDs now for this issue which have not shown any gastric outlet of obstruction or concerning pathology.  A noncontrast CT scan of the abdomen pelvis in October did not show any significant abnormalities.  I reviewed her chart and she does have longstanding diabetes with a history of gastroparesis in 2004 based on GES.  It is quite possible gastroparesis could be related to some of her symptoms.  I otherwise question whether or not she has been compliant with her Medrol at home, she is unaware of the dosing of what she supposed to take, this raises the possibility for adrenal insufficiency as well.  I will touch base with her primary care about this to clarify her dosing and have asked the patient to contact me with the dosing she has at home when she gets back to her house.  Otherwise, recommend trial of Reglan to treat for gastroparesis.  I did discuss risks with her to include tardive dyskinesia which is rare especially at low dosing.  We will start at 5 mg TID with each meal.  She can increase to 10 mg dosing as needed if she tolerates it. She was just given Phenergan yesterday, she can try this to use as needed, may be better to  take at night given it can be sedating.  She also has Zofran to use but has been helping too much. She will continue her PPI and can continue carafate if she thinks it helps.  If for some reason she is getting dehydrated and cannot maintain p.o. intake she needs to contact us or seek emergent  care for fluids.  All questions answered, I would like to hear back from her in a few weeks either way to see how she is doing on the regimen.  [place]sex Cellar, MD [facility]

## 2019-06-18 NOTE — Patient Instructions (Addendum)
If you are age 81 or older, your body mass index should be between 23-30. Your Body mass index is 22.9 kg/m. If this is out of the aforementioned range listed, please consider follow up with your Primary Care Provider.  If you are age 72 or younger, your body mass index should be between 19-25. Your Body mass index is 22.9 kg/m. If this is out of the aformentioned range listed, please consider follow up with your Primary Care Provider.   To help prevent the possible spread of infection to our patients, communities, and staff; we will be implementing the following measures:  As of now we are not allowing any visitors/family members to accompany you to any upcoming appointments with [facility]. If you have any concerns about this please contact our office to discuss prior to the appointment.    We have sent the following medications to your pharmacy for you to pick up at your convenience: Reglan 5mg : Take one to two tablets three times a day prior to meals  Please call us and let us know what steroids you are taking and the dosing. Our number is [phone].  Thank you for entrusting me with your care and for choosing [facility], Dr. Rutledge Cellar

## 2019-06-21 ENCOUNTER — Telehealth: Payer: Self-pay

## 2019-06-21 ENCOUNTER — Ambulatory Visit: Payer: Self-pay

## 2019-06-21 DIAGNOSIS — I10 Essential (primary) hypertension: Secondary | ICD-10-CM

## 2019-06-21 DIAGNOSIS — E876 Hypokalemia: Secondary | ICD-10-CM

## 2019-06-21 DIAGNOSIS — E1165 Type 2 diabetes mellitus with hyperglycemia: Secondary | ICD-10-CM

## 2019-06-21 DIAGNOSIS — IMO0002 Reserved for concepts with insufficient information to code with codable children: Secondary | ICD-10-CM

## 2019-06-21 DIAGNOSIS — N183 Chronic kidney disease, stage 3 unspecified: Secondary | ICD-10-CM

## 2019-06-21 NOTE — Telephone Encounter (Signed)
Left message on niece Bethena Roys) voice mail to please call back

## 2019-06-21 NOTE — Patient Instructions (Signed)
Social Worker Visit Information  Materials provided: Verbal education about CM program provided by phone  [name] was given information about Chronic Care Management services today including:  1. CCM service includes personalized support from designated clinical staff supervised by her physician, including individualized plan of care and coordination with other care providers 2. 24/7 contact phone numbers for assistance for urgent and routine care needs. 3. Service will only be billed when office clinical staff spend 20 minutes or more in a month to coordinate care. 4. Only one practitioner may furnish and bill the service in a calendar month. 5. The patient may stop CCM services at any time (effective at the end of the month) by phone call to the office staff. 6. The patient will be responsible for cost sharing (co-pay) of up to 20% of the service fee (after annual deductible is met).  Patient agreed to services and verbal consent obtained.   The patient verbalized understanding of instructions provided today and declined a print copy of patient instruction materials.   Follow up plan: No SW follow up planned at this time. Our nurse case manager, Barb Merino, will contact you via phone to complete a clinical assessment.   Daneen Schick, BSW, CDP Social Worker, Certified Dementia Practitioner [place] [facility] [place] [facility] [phone]

## 2019-06-21 NOTE — Telephone Encounter (Signed)
Pt said she is returning your call °

## 2019-06-21 NOTE — Telephone Encounter (Signed)
-----   Message from Yetta Flock, MD sent at 06/21/2019 12:29 PM EST ----- Regarding: FW: mutual patient [name] can you please touch base with the patient or her family - need to clarify if she is taking Medrol at home. She thought she was but her PCP does not think she was on it. I wonder if she is not taking anything if it's possible she has adrenal insufficiency. We may need to speak with her Rheumatologist, once I hear back from you. Thanks   ----- Message ----- From: [name], PA-C Sent: 06/21/2019   8:45 AM EST To: Yetta Flock, MD Subject: RE: mutual patient                             Good morning. I dont recall her being on Medrol. She brought in all her bottles and I updated her medication list in person. Thank you.  Sunday Spillers ----- Message ----- From: Yetta Flock, MD Sent: 06/18/2019   5:41 PM EST To: [name], PA-C Subject: mutual patient                                 Hello,  I just wanted to touch base about this mutual patient I saw today. She is having ongoing nausea / vomiting, I am going to treat her for gastroparesis, but she otherwise could not clarify how much Medrol she is taking. Do you know what her dose is supposed to be? If she is not taking it, could have a component of adrenal insufficiency which could also present like this. Thanks for any follow up.  Richardson Landry

## 2019-06-21 NOTE — Chronic Care Management (AMB) (Signed)
Chronic Care Management     Social Work General Note  06/21/2019 Name: [name] MRN: [id number] DOB: [birth year]  [name] is a 83 y.o. year old female who is a primary care patient of [place], Sunday Spillers, [place]. The CCM was consulted to assist the patient with care coordination.   [name] was given information about Chronic Care Management services today including:  1. CCM service includes personalized support from designated clinical staff supervised by her physician, including individualized plan of care and coordination with other care providers 2. 24/7 contact phone numbers for assistance for urgent and routine care needs. 3. Service will only be billed when office clinical staff spend 20 minutes or more in a month to coordinate care. 4. Only one practitioner may furnish and bill the service in a calendar month. 5. The patient may stop CCM services at any time (effective at the end of the month) by phone call to the office staff. 6. The patient will be responsible for cost sharing (co-pay) of up to 20% of the service fee (after annual deductible is met).  Patient agreed to services and verbal consent obtained.   Review of patient status, including review of consultants reports, relevant laboratory and other test results, and collaboration with appropriate care team members and the patient's provider was performed as part of comprehensive patient evaluation and provision of chronic care management services.    SDOH (Social Determinants of Health) screening performed today. The patient does not identify any resource needs. The patient reports she lives alone and is independent in caring for herself. The patients niece and nephew assist with medication management. The patient is knowledgeable of her plan benefits and accesses the OTC Catalog to retrieve needed supplies. The patient reports use of a walker, raised toilet seat, and shower chair in the home. The patient  denies medication management concerns including cost of medications. No SW needs identified during today's call.  Outpatient Encounter Medications as of 06/21/2019  Medication Sig Note  . albuterol (ACCUNEB) 0.63 MG/3ML nebulizer solution Take 2 ampules by nebulization every 6 (six) hours as needed for wheezing.   Marland Kitchen aspirin EC 81 MG tablet Take 1 tablet (81 mg total) by mouth daily. (Patient taking differently: Take 81 mg by mouth 3 (three) times a week. )   . atorvastatin (LIPITOR) 10 MG tablet One qd x 1 month, then 2 qd if tolerated (Patient taking differently: Take 10-20 mg by mouth See admin instructions. Take 10 mg by mouth once a day for 1 month, then increase to 20 mg once a day (if tolerated)) 05/21/2019: Patient is still taking (just) 10 mg daily  . dicyclomine (BENTYL) 20 MG tablet Take 20 mg by mouth 2 (two) times daily.   Marland Kitchen glucose blood (ONETOUCH VERIO) test strip Use to check blood sugar 3 to 4 times a day before meals   . hydroxychloroquine (PLAQUENIL) 200 MG tablet Take 200 mg by mouth 2 (two) times daily.   . hydrOXYzine (ATARAX/VISTARIL) 10 MG tablet 1 q 8h prn itching (Patient taking differently: Take 10 mg by mouth every 8 (eight) hours as needed for itching. )   . Magnesium 400 MG TABS Take 400 mg by mouth every morning for 30 days, THEN 400 mg every morning.   . methylPREDNISolone (MEDROL) 4 MG tablet Take 4 mg by mouth daily.   . metoCLOPramide (REGLAN) 5 MG tablet Take 1-2 tablets (5-10 mg total) by mouth 3 (three) times daily before meals.   . ondansetron [facility]  ODT) 4 MG disintegrating tablet Take 1 tablet (4 mg total) by mouth every 8 (eight) hours as needed for nausea or vomiting.   Derrill Memo ON 07/06/2019] pantoprazole (PROTONIX) 40 MG tablet Take 1 tablet (40 mg total) by mouth 2 (two) times daily. Take 1 tablet, 40 mg, by mouth 2 times daily x6 weeks then 1 tablet, 40 mg once daily indefinitely   . promethazine (PHENERGAN) 25 MG tablet Take 1 tablet (25 mg total) by  mouth every 6 (six) hours as needed for nausea or vomiting.   . simethicone (GAS-X EXTRA STRENGTH) 125 MG chewable tablet Chew 125 mg by mouth every 6 (six) hours as needed for flatulence.   . sucralfate (CARAFATE) 1 GM/10ML suspension Take 10 mLs (1 g total) by mouth 4 (four) times daily -  with meals and at bedtime. 05/30/2019: Has not started   . traMADol (ULTRAM) 50 MG tablet One qhs prn pain    No facility-administered encounter medications on file as of 06/21/2019.     Follow Up Plan: No SW follow planned at this time. The patient is successfully enrolled in the case management program and will be outreached by RN Case Manager over the next 30 days. SW encouraged patient to contact SW or her primary provider is assistance is needed prior to the RN outreach call.       Daneen Schick, BSW, CDP Social Worker, Certified Dementia Practitioner [place] [facility] [place] [facility] [phone]

## 2019-06-22 ENCOUNTER — Other Ambulatory Visit: Payer: Self-pay

## 2019-06-22 ENCOUNTER — Telehealth: Payer: Self-pay

## 2019-06-22 DIAGNOSIS — R112 Nausea with vomiting, unspecified: Secondary | ICD-10-CM

## 2019-06-22 NOTE — Telephone Encounter (Signed)
LVM on pt vm to call office for message from provider   Rodriguez-Southworth, Sandrea Matte  Candiss Norse T, CMA        Please inform pt that her magnesium is unchanges from last time, is still at 1.4, and needs to take 2 doses of her magnesium instead of one. Also her pancreatitis test are better, but not all back to normal. Her kidney function is a little bit better, needs to keep on hydrating herself. How is she doing? Is the phenergan helping her nausea better?

## 2019-06-22 NOTE — Telephone Encounter (Signed)
Spoke to [place] (patient's niece) she went and checked all the patient's meds and states she is not currently on Medrol nor has she been taking it for a while

## 2019-06-22 NOTE — Telephone Encounter (Signed)
Okay thanks [employer]. I'd like her to go to the lab for an AM fasting cortisol level if you can order for me and ask her to go to the lab one morning when she is available. Thanks

## 2019-06-22 NOTE — Telephone Encounter (Signed)
See phone note

## 2019-06-22 NOTE — Telephone Encounter (Signed)
-----   Message from Yetta Flock, MD sent at 06/21/2019 12:29 PM EST ----- Regarding: FW: mutual patient [name] can you please touch base with the patient or her family - need to clarify if she is taking Medrol at home. She thought she was but her PCP does not think she was on it. I wonder if she is not taking anything if it's possible she has adrenal insufficiency. We may need to speak with her Rheumatologist, once I hear back from you. Thanks   ----- Message ----- From: [name], PA-C Sent: 06/21/2019   8:45 AM EST To: Yetta Flock, MD Subject: RE: mutual patient                             Good morning. I dont recall her being on Medrol. She brought in all her bottles and I updated her medication list in person. Thank you.  Sunday Spillers ----- Message ----- From: Yetta Flock, MD Sent: 06/18/2019   5:41 PM EST To: [name], PA-C Subject: mutual patient                                 Hello,  I just wanted to touch base about this mutual patient I saw today. She is having ongoing nausea / vomiting, I am going to treat her for gastroparesis, but she otherwise could not clarify how much Medrol she is taking. Do you know what her dose is supposed to be? If she is not taking it, could have a component of adrenal insufficiency which could also present like this. Thanks for any follow up.  Richardson Landry

## 2019-06-22 NOTE — Telephone Encounter (Signed)
Called patient and her niece Bethena Roys) and she will come into our lab one morning next week for a Fasting Cortisol level

## 2019-06-23 ENCOUNTER — Encounter: Payer: Self-pay | Admitting: Internal Medicine

## 2019-06-28 ENCOUNTER — Other Ambulatory Visit: Payer: Medicare Other

## 2019-06-28 DIAGNOSIS — R112 Nausea with vomiting, unspecified: Secondary | ICD-10-CM

## 2019-06-29 LAB — CORTISOL-AM, BLOOD: Cortisol - AM: 21.2 ug/dL

## 2019-07-05 ENCOUNTER — Telehealth: Payer: Self-pay

## 2019-07-05 NOTE — Telephone Encounter (Signed)
Left message for patient to please call back. 

## 2019-07-07 ENCOUNTER — Other Ambulatory Visit: Payer: Self-pay

## 2019-07-07 MED ORDER — ATORVASTATIN CALCIUM 10 MG PO TABS: ORAL_TABLET | ORAL | 1 refills | Status: DC

## 2019-07-12 ENCOUNTER — Encounter (HOSPITAL_COMMUNITY): Payer: Self-pay | Admitting: Emergency Medicine

## 2019-07-12 ENCOUNTER — Emergency Department (HOSPITAL_COMMUNITY): Payer: Medicare Other

## 2019-07-12 ENCOUNTER — Other Ambulatory Visit: Payer: Self-pay

## 2019-07-12 ENCOUNTER — Inpatient Hospital Stay (HOSPITAL_COMMUNITY)
Admission: EM | Admit: 2019-07-12 | Discharge: 2019-07-14 | DRG: 074 | Disposition: A | Discharge status: Home Health | Payer: Medicare Other | Attending: Internal Medicine | Admitting: Internal Medicine

## 2019-07-12 DIAGNOSIS — T380X5A Adverse effect of glucocorticoids and synthetic analogues, initial encounter: Secondary | ICD-10-CM | POA: Diagnosis present

## 2019-07-12 DIAGNOSIS — R634 Abnormal weight loss: Secondary | ICD-10-CM | POA: Diagnosis not present

## 2019-07-12 DIAGNOSIS — N390 Urinary tract infection, site not specified: Secondary | ICD-10-CM | POA: Diagnosis present

## 2019-07-12 DIAGNOSIS — Z7952 Long term (current) use of systemic steroids: Secondary | ICD-10-CM | POA: Diagnosis not present

## 2019-07-12 DIAGNOSIS — Z20828 Contact with and (suspected) exposure to other viral communicable diseases: Secondary | ICD-10-CM | POA: Diagnosis present

## 2019-07-12 DIAGNOSIS — E119 Type 2 diabetes mellitus without complications: Secondary | ICD-10-CM

## 2019-07-12 DIAGNOSIS — J449 Chronic obstructive pulmonary disease, unspecified: Secondary | ICD-10-CM | POA: Diagnosis present

## 2019-07-12 DIAGNOSIS — R112 Nausea with vomiting, unspecified: Secondary | ICD-10-CM

## 2019-07-12 DIAGNOSIS — Z833 Family history of diabetes mellitus: Secondary | ICD-10-CM | POA: Diagnosis not present

## 2019-07-12 DIAGNOSIS — K5900 Constipation, unspecified: Secondary | ICD-10-CM | POA: Diagnosis present

## 2019-07-12 DIAGNOSIS — I10 Essential (primary) hypertension: Secondary | ICD-10-CM | POA: Diagnosis not present

## 2019-07-12 DIAGNOSIS — M05742 Rheumatoid arthritis with rheumatoid factor of left hand without organ or systems involvement: Secondary | ICD-10-CM | POA: Diagnosis not present

## 2019-07-12 DIAGNOSIS — K219 Gastro-esophageal reflux disease without esophagitis: Secondary | ICD-10-CM | POA: Diagnosis present

## 2019-07-12 DIAGNOSIS — Z90710 Acquired absence of both cervix and uterus: Secondary | ICD-10-CM

## 2019-07-12 DIAGNOSIS — M05741 Rheumatoid arthritis with rheumatoid factor of right hand without organ or systems involvement: Secondary | ICD-10-CM | POA: Diagnosis not present

## 2019-07-12 DIAGNOSIS — E1143 Type 2 diabetes mellitus with diabetic autonomic (poly)neuropathy: Principal | ICD-10-CM | POA: Diagnosis present

## 2019-07-12 DIAGNOSIS — E7800 Pure hypercholesterolemia, unspecified: Secondary | ICD-10-CM | POA: Diagnosis present

## 2019-07-12 DIAGNOSIS — J42 Unspecified chronic bronchitis: Secondary | ICD-10-CM | POA: Diagnosis not present

## 2019-07-12 DIAGNOSIS — R7989 Other specified abnormal findings of blood chemistry: Secondary | ICD-10-CM | POA: Diagnosis present

## 2019-07-12 DIAGNOSIS — R296 Repeated falls: Secondary | ICD-10-CM | POA: Diagnosis present

## 2019-07-12 DIAGNOSIS — E860 Dehydration: Secondary | ICD-10-CM | POA: Diagnosis present

## 2019-07-12 DIAGNOSIS — I5032 Chronic diastolic (congestive) heart failure: Secondary | ICD-10-CM | POA: Diagnosis present

## 2019-07-12 DIAGNOSIS — M069 Rheumatoid arthritis, unspecified: Secondary | ICD-10-CM | POA: Diagnosis present

## 2019-07-12 DIAGNOSIS — Z66 Do not resuscitate: Secondary | ICD-10-CM | POA: Diagnosis present

## 2019-07-12 DIAGNOSIS — W19XXXA Unspecified fall, initial encounter: Secondary | ICD-10-CM | POA: Diagnosis present

## 2019-07-12 DIAGNOSIS — I110 Hypertensive heart disease with heart failure: Secondary | ICD-10-CM | POA: Diagnosis present

## 2019-07-12 DIAGNOSIS — R509 Fever, unspecified: Secondary | ICD-10-CM

## 2019-07-12 DIAGNOSIS — K3184 Gastroparesis: Secondary | ICD-10-CM | POA: Diagnosis present

## 2019-07-12 DIAGNOSIS — Z79899 Other long term (current) drug therapy: Secondary | ICD-10-CM

## 2019-07-12 DIAGNOSIS — G4733 Obstructive sleep apnea (adult) (pediatric): Secondary | ICD-10-CM | POA: Diagnosis present

## 2019-07-12 DIAGNOSIS — K449 Diaphragmatic hernia without obstruction or gangrene: Secondary | ICD-10-CM | POA: Diagnosis present

## 2019-07-12 DIAGNOSIS — M25562 Pain in left knee: Secondary | ICD-10-CM | POA: Diagnosis present

## 2019-07-12 DIAGNOSIS — Z9049 Acquired absence of other specified parts of digestive tract: Secondary | ICD-10-CM

## 2019-07-12 DIAGNOSIS — Z8249 Family history of ischemic heart disease and other diseases of the circulatory system: Secondary | ICD-10-CM | POA: Diagnosis not present

## 2019-07-12 DIAGNOSIS — Z7982 Long term (current) use of aspirin: Secondary | ICD-10-CM

## 2019-07-12 DIAGNOSIS — R627 Adult failure to thrive: Secondary | ICD-10-CM | POA: Diagnosis present

## 2019-07-12 DIAGNOSIS — R1110 Vomiting, unspecified: Secondary | ICD-10-CM

## 2019-07-12 DIAGNOSIS — Z87891 Personal history of nicotine dependence: Secondary | ICD-10-CM | POA: Diagnosis not present

## 2019-07-12 DIAGNOSIS — E1122 Type 2 diabetes mellitus with diabetic chronic kidney disease: Secondary | ICD-10-CM | POA: Diagnosis not present

## 2019-07-12 DIAGNOSIS — E876 Hypokalemia: Secondary | ICD-10-CM | POA: Diagnosis present

## 2019-07-12 DIAGNOSIS — N182 Chronic kidney disease, stage 2 (mild): Secondary | ICD-10-CM | POA: Diagnosis not present

## 2019-07-12 DIAGNOSIS — E785 Hyperlipidemia, unspecified: Secondary | ICD-10-CM | POA: Diagnosis present

## 2019-07-12 DIAGNOSIS — Z6822 Body mass index (BMI) 22.0-22.9, adult: Secondary | ICD-10-CM

## 2019-07-12 LAB — URINALYSIS, ROUTINE W REFLEX MICROSCOPIC
Bilirubin Urine: NEGATIVE
Glucose, UA: NEGATIVE mg/dL
Hgb urine dipstick: NEGATIVE
Ketones, ur: NEGATIVE mg/dL
Nitrite: POSITIVE — AB
Protein, ur: 30 mg/dL — AB
Specific Gravity, Urine: 1.018 (ref 1.005–1.030)
pH: 5 (ref 5.0–8.0)

## 2019-07-12 LAB — T4, FREE: Free T4: 2.33 ng/dL — ABNORMAL HIGH (ref 0.61–1.12)

## 2019-07-12 LAB — CBC
HCT: 41.7 % (ref 36.0–46.0)
Hemoglobin: 13.3 g/dL (ref 12.0–15.0)
MCH: 28.4 pg (ref 26.0–34.0)
MCHC: 31.9 g/dL (ref 30.0–36.0)
MCV: 89.1 fL (ref 80.0–100.0)
Platelets: 351 K/uL (ref 150–400)
RBC: 4.68 MIL/uL (ref 3.87–5.11)
RDW: 14.6 % (ref 11.5–15.5)
WBC: 9.9 K/uL (ref 4.0–10.5)
nRBC: 0.2 % (ref 0.0–0.2)

## 2019-07-12 LAB — BASIC METABOLIC PANEL WITH GFR
Anion gap: 16 — ABNORMAL HIGH (ref 5–15)
BUN: 13 mg/dL (ref 8–23)
CO2: 24 mmol/L (ref 22–32)
Calcium: 9.6 mg/dL (ref 8.9–10.3)
Chloride: 99 mmol/L (ref 98–111)
Creatinine, Ser: 1.16 mg/dL — ABNORMAL HIGH (ref 0.44–1.00)
GFR calc Af Amer: 51 mL/min — ABNORMAL LOW (ref >60)
GFR calc non Af Amer: 44 mL/min — ABNORMAL LOW (ref >60)
Glucose, Bld: 179 mg/dL — ABNORMAL HIGH (ref 70–99)
Potassium: 3 mmol/L — ABNORMAL LOW (ref 3.5–5.1)
Sodium: 139 mmol/L (ref 135–145)

## 2019-07-12 LAB — LACTIC ACID, PLASMA
Lactic Acid, Venous: 3.2 mmol/L (ref 0.5–1.9)
Lactic Acid, Venous: 1.3 mmol/L (ref 0.5–1.9)

## 2019-07-12 LAB — CBG MONITORING, ED
Glucose-Capillary: 125 mg/dL — ABNORMAL HIGH (ref 70–99)
Glucose-Capillary: 107 mg/dL — ABNORMAL HIGH (ref 70–99)

## 2019-07-12 LAB — HEPATIC FUNCTION PANEL
ALT: 16 U/L (ref 0–44)
AST: 34 U/L (ref 15–41)
Albumin: 3.3 g/dL — ABNORMAL LOW (ref 3.5–5.0)
Alkaline Phosphatase: 42 U/L (ref 38–126)
Bilirubin, Direct: 0.2 mg/dL (ref 0.0–0.2)
Indirect Bilirubin: 0.5 mg/dL (ref 0.3–0.9)
Total Bilirubin: 0.7 mg/dL (ref 0.3–1.2)
Total Protein: 6.7 g/dL (ref 6.5–8.1)

## 2019-07-12 LAB — SARS CORONAVIRUS 2 (TAT 6-24 HRS): SARS Coronavirus 2: NEGATIVE

## 2019-07-12 LAB — POC SARS CORONAVIRUS 2 AG -  ED: SARS Coronavirus 2 Ag: NEGATIVE

## 2019-07-12 LAB — GLUCOSE, CAPILLARY
Glucose-Capillary: 99 mg/dL (ref 70–99)
Glucose-Capillary: 93 mg/dL (ref 70–99)
Glucose-Capillary: 85 mg/dL (ref 70–99)

## 2019-07-12 LAB — MAGNESIUM: Magnesium: 1.5 mg/dL — ABNORMAL LOW (ref 1.7–2.4)

## 2019-07-12 LAB — TSH: TSH: 2.1 u[IU]/mL (ref 0.350–4.500)

## 2019-07-12 LAB — LIPASE, BLOOD: Lipase: 86 U/L — ABNORMAL HIGH (ref 11–51)

## 2019-07-12 MED ORDER — DICYCLOMINE HCL 20 MG PO TABS
20.0000 mg | ORAL_TABLET | Freq: Two times a day (BID) | ORAL | Status: DC
  Administered 2019-07-12 – 2019-07-14 (×4): 20 mg via ORAL
  Filled 2019-07-12 (×4): qty 1

## 2019-07-12 MED ORDER — ONDANSETRON HCL 4 MG PO TABS: 4.0000 mg | ORAL_TABLET | Freq: Four times a day (QID) | ORAL | Status: DC | PRN

## 2019-07-12 MED ORDER — ACETAMINOPHEN 500 MG PO TABS
1000.0000 mg | ORAL_TABLET | Freq: Once | ORAL | Status: AC
  Administered 2019-07-12: 1000 mg via ORAL
  Filled 2019-07-12: qty 2

## 2019-07-12 MED ORDER — METOCLOPRAMIDE HCL 5 MG/ML IJ SOLN
10.0000 mg | Freq: Three times a day (TID) | INTRAMUSCULAR | Status: DC
  Administered 2019-07-12 – 2019-07-14 (×9): 10 mg via INTRAVENOUS
  Filled 2019-07-12 (×9): qty 2

## 2019-07-12 MED ORDER — MORPHINE SULFATE (PF) 2 MG/ML IV SOLN
2.0000 mg | Freq: Once | INTRAVENOUS | Status: AC
  Administered 2019-07-12: 2 mg via INTRAVENOUS
  Filled 2019-07-12: qty 1

## 2019-07-12 MED ORDER — KETOROLAC TROMETHAMINE 15 MG/ML IJ SOLN
15.0000 mg | Freq: Once | INTRAMUSCULAR | Status: AC
  Administered 2019-07-12: 15 mg via INTRAVENOUS
  Filled 2019-07-12: qty 1

## 2019-07-12 MED ORDER — METOCLOPRAMIDE HCL 10 MG PO TABS: 5.0000 mg | ORAL_TABLET | Freq: Three times a day (TID) | ORAL | Status: DC

## 2019-07-12 MED ORDER — ALBUTEROL SULFATE (2.5 MG/3ML) 0.083% IN NEBU: 3.0000 mL | INHALATION_SOLUTION | Freq: Four times a day (QID) | RESPIRATORY_TRACT | Status: DC | PRN

## 2019-07-12 MED ORDER — ONDANSETRON HCL 4 MG/2ML IJ SOLN
4.0000 mg | Freq: Once | INTRAMUSCULAR | Status: AC
  Administered 2019-07-12: 4 mg via INTRAVENOUS
  Filled 2019-07-12: qty 2

## 2019-07-12 MED ORDER — SODIUM CHLORIDE 0.9% FLUSH: 3.0000 mL | Freq: Once | INTRAVENOUS | Status: DC

## 2019-07-12 MED ORDER — MAGNESIUM OXIDE 400 (241.3 MG) MG PO TABS
400.0000 mg | ORAL_TABLET | Freq: Every day | ORAL | Status: DC
  Administered 2019-07-13 – 2019-07-14 (×2): 400 mg via ORAL
  Filled 2019-07-12 (×2): qty 1

## 2019-07-12 MED ORDER — ACETAMINOPHEN 650 MG RE SUPP: 650.0000 mg | Freq: Four times a day (QID) | RECTAL | Status: DC | PRN

## 2019-07-12 MED ORDER — ATORVASTATIN CALCIUM 10 MG PO TABS
10.0000 mg | ORAL_TABLET | Freq: Every day | ORAL | Status: DC
  Administered 2019-07-12 – 2019-07-13 (×2): 10 mg via ORAL
  Filled 2019-07-12 (×2): qty 1

## 2019-07-12 MED ORDER — POTASSIUM CHLORIDE CRYS ER 20 MEQ PO TBCR
40.0000 meq | EXTENDED_RELEASE_TABLET | Freq: Once | ORAL | Status: AC
  Administered 2019-07-12: 40 meq via ORAL
  Filled 2019-07-12: qty 2

## 2019-07-12 MED ORDER — BISACODYL 5 MG PO TBEC: 5.0000 mg | DELAYED_RELEASE_TABLET | Freq: Every day | ORAL | Status: DC | PRN

## 2019-07-12 MED ORDER — POLYETHYLENE GLYCOL 3350 17 G PO PACK: 17.0000 g | PACK | Freq: Every day | ORAL | Status: DC | PRN

## 2019-07-12 MED ORDER — SODIUM CHLORIDE 0.9 % IV BOLUS
1000.0000 mL | Freq: Once | INTRAVENOUS | Status: AC
  Administered 2019-07-12: 1000 mL via INTRAVENOUS

## 2019-07-12 MED ORDER — METHYLPREDNISOLONE 4 MG PO TABS
4.0000 mg | ORAL_TABLET | Freq: Every day | ORAL | Status: DC
  Administered 2019-07-13 – 2019-07-14 (×2): 4 mg via ORAL
  Filled 2019-07-12 (×3): qty 1

## 2019-07-12 MED ORDER — ACETAMINOPHEN 325 MG PO TABS
650.0000 mg | ORAL_TABLET | Freq: Four times a day (QID) | ORAL | Status: DC | PRN
  Administered 2019-07-13: 650 mg via ORAL
  Filled 2019-07-12: qty 2

## 2019-07-12 MED ORDER — POTASSIUM CHLORIDE IN NACL 20-0.9 MEQ/L-% IV SOLN
INTRAVENOUS | Status: DC
  Administered 2019-07-12: 10:00:00 via INTRAVENOUS
  Filled 2019-07-12 (×6): qty 1000

## 2019-07-12 MED ORDER — ONDANSETRON HCL 4 MG/2ML IJ SOLN: 4.0000 mg | Freq: Four times a day (QID) | INTRAMUSCULAR | Status: DC | PRN

## 2019-07-12 MED ORDER — SUCRALFATE 1 GM/10ML PO SUSP
1.0000 g | Freq: Three times a day (TID) | ORAL | Status: DC
  Administered 2019-07-12 – 2019-07-14 (×9): 1 g via ORAL
  Filled 2019-07-12 (×12): qty 10

## 2019-07-12 MED ORDER — ENOXAPARIN SODIUM 40 MG/0.4ML ~~LOC~~ SOLN
40.0000 mg | SUBCUTANEOUS | Status: DC
  Administered 2019-07-12 – 2019-07-14 (×3): 40 mg via SUBCUTANEOUS
  Filled 2019-07-12 (×3): qty 0.4

## 2019-07-12 MED ORDER — INSULIN ASPART 100 UNIT/ML ~~LOC~~ SOLN: 0.0000 [IU] | Freq: Every day | SUBCUTANEOUS | Status: DC

## 2019-07-12 MED ORDER — DOCUSATE SODIUM 100 MG PO CAPS
100.0000 mg | ORAL_CAPSULE | Freq: Two times a day (BID) | ORAL | Status: DC
  Administered 2019-07-12 – 2019-07-14 (×4): 100 mg via ORAL
  Filled 2019-07-12 (×4): qty 1

## 2019-07-12 MED ORDER — HYDROXYCHLOROQUINE SULFATE 200 MG PO TABS
200.0000 mg | ORAL_TABLET | Freq: Two times a day (BID) | ORAL | Status: DC
  Administered 2019-07-12 – 2019-07-14 (×4): 200 mg via ORAL
  Filled 2019-07-12 (×6): qty 1

## 2019-07-12 MED ORDER — INSULIN ASPART 100 UNIT/ML ~~LOC~~ SOLN
0.0000 [IU] | Freq: Three times a day (TID) | SUBCUTANEOUS | Status: DC
  Administered 2019-07-13: 2 [IU] via SUBCUTANEOUS

## 2019-07-12 MED ORDER — HYDROXYZINE HCL 10 MG PO TABS
10.0000 mg | ORAL_TABLET | Freq: Three times a day (TID) | ORAL | Status: DC | PRN
  Administered 2019-07-14: 10 mg via ORAL
  Filled 2019-07-12: qty 1

## 2019-07-12 MED ORDER — SODIUM CHLORIDE 0.9% FLUSH
3.0000 mL | Freq: Two times a day (BID) | INTRAVENOUS | Status: DC
  Administered 2019-07-12 – 2019-07-14 (×2): 3 mL via INTRAVENOUS

## 2019-07-12 MED ORDER — PANTOPRAZOLE SODIUM 40 MG IV SOLR
40.0000 mg | Freq: Two times a day (BID) | INTRAVENOUS | Status: DC
  Administered 2019-07-12 – 2019-07-14 (×5): 40 mg via INTRAVENOUS
  Filled 2019-07-12 (×5): qty 40

## 2019-07-12 MED ORDER — FLEET ENEMA 7-19 GM/118ML RE ENEM
1.0000 | ENEMA | Freq: Once | RECTAL | Status: DC | PRN
  Filled 2019-07-12: qty 1

## 2019-07-12 MED ORDER — ASPIRIN EC 81 MG PO TBEC
81.0000 mg | DELAYED_RELEASE_TABLET | ORAL | Status: DC
  Administered 2019-07-13 – 2019-07-14 (×2): 81 mg via ORAL
  Filled 2019-07-12 (×2): qty 1

## 2019-07-12 NOTE — ED Triage Notes (Signed)
Pt in via [facility] after syncopal fall last evening around 1730. Pt reports dizziness x weeks, poor appetite and n/v x 1 mo. Pt states she woke up with L knee pain after her fall. Reports worsened dizziness when standing. HR 136 in triage, BP 102/73, CBG 229 per EMS

## 2019-07-12 NOTE — ED Notes (Signed)
IV team bedside. 

## 2019-07-12 NOTE — ED Notes (Signed)
Breakfast ordered 

## 2019-07-12 NOTE — H&P (Addendum)
History and Physical    [name] [id number] DOB: 04-Sep-1935 DOA: 07/12/2019  PCP: [name], PA-C Consultants:  Havery Moros - GI; [place] - pulmonology; Johnsie Cancel - cardiology Patient coming from:  Home - lives with nephew, niece visits regularly; NOK: Niece, [phone]  Chief Complaint: syncope  HPI: [name] is a 83 y.o. female with medical history significant of DM; OSA on BIPAP; HTN; HLD; COPD; RA on chronic immunosuppression; and chronic diastolic CHF presenting with syncope.  The first time she came to the hospital, she had a bad stomach virus.  She had EGD and she was given prescriptions and discharged.  She wasn't any better and continuing to have daily vomiting and decreased PO intake (this is persisting).  She returned to the hospital with the same symptoms.  They kept her a couple of days and sent her home.  Since then, she can't eat, losing weight, can't even walk from her bedroom to the bathroom - she is too dizzy, shaky, can't make it.  She has lost maybe 45 pounds in the last maybe 2 months.  +headaches, + vision changes (?related to orthostasis, only when she stands up and gets dizzy).  +chest pain about every other day (epigastric pain).  The Carafate is not helping.  Constantly nauseated, occasionally vomiting, frequent dry heaves.  +chronic constipation.    No urinary symptoms.  No skin rash.  +night sweats.  She fell last night, awoke with posterior thigh pain.  She did pass out when she fell.   Her last hospitalization was from 10/31-11/2 and before that 10/21-28.   She had a GI consult with EGD that showed chronic gastritis; negative urine culture, treated with 3 days of Cefepime; and AKI that resolved with IVF (creatinine 4.91 -> 0.8).  ED Course:  Frequent n/v, poor PO intake, frequent visits.  Tachycardia with some improvement with IVF.  Lactate is 3, rectal temp 100.1, rigors.    Review of Systems: As per HPI; otherwise review of systems  reviewed and negative.   Ambulatory Status: Ambulates without assistance - but now too weak to get around  Past Medical History:  Diagnosis Date  . Allergy   . Anemia    "when I was young"  . Anginal pain ([place])   . Anxiety   . Arthritis    "all over my body"   . Chest pain 04/07/2014  . Chronic diastolic heart failure, NYHA class 1 ([place]) 01/22/2015  . Chronic lower back pain   . Chronic steroid use 12/11/2012  . COPD (chronic obstructive pulmonary disease) ([place]) 01/09/2017  . 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 Elevated lactic acid level 01/22/2015  . Essential hypertension 06/15/2018  . Frequent UTI    "recently" (06/14/2015)  . Gastric mass   . GERD (gastroesophageal reflux disease)   . Headache(784.0)    "weekly; wake up w/them " (110/19/2018)  . Heart murmur   . High cholesterol   . History of blood transfusion ~ 1954   "related to my periods"  . Migraine    "once/month maybe" (05/16/2017)  . Multiple falls 06/14/2015  . Noncompliance 12/11/2012  . OSA (obstructive sleep apnea)    "I think I'm using a BiPAP" (06/14/2015)  . Sepsis ([place]) 01/09/2017  . Syncope and collapse 12/11/2012  . Tachycardia with heart rate 100-120 beats per minute 04/08/2014  . Type II diabetes mellitus ([place])   . Uncontrolled type 2 diabetes mellitus with complication [facility])     Past Surgical History:  Procedure Laterality Date  . APPENDECTOMY  1970   Archie Endo 12/12/2010  . BACK SURGERY    . BIOPSY  05/25/2019   Procedure: BIOPSY;  Surgeon: Rush Landmark Telford Nab., MD;  Location: [place];  Service: Gastroenterology;;  . [place]   Archie Endo 12/12/2010  . ESOPHAGOGASTRODUODENOSCOPY N/A 05/25/2019   Procedure: ESOPHAGOGASTRODUODENOSCOPY (EGD);  Surgeon: Irving Copas., MD;  Location: [facility];  Service: Gastroenterology;  Laterality: N/A;  . EUS N/A 10/16/2017   Procedure: UPPER ENDOSCOPIC ULTRASOUND (EUS) RADIAL;  Surgeon:  Milus Banister, MD;  Location: [facility];  Service: Endoscopy;  Laterality: N/A;  . FRACTURE SURGERY    . LAPAROSCOPIC CHOLECYSTECTOMY  1990's  . LUMBAR MICRODISCECTOMY Left 09/2006   L5-S1/notes 12/12/2010  . MULTIPLE EXTRACTIONS WITH ALVEOLOPLASTY Bilateral 06/13/2017   Procedure: MULTIPLE EXTRACTION WITH ALVEOLOPLASTY;  Surgeon: Diona Browner, DDS;  Location: [facility];  Service: Oral Surgery;  Laterality: Bilateral;  . MULTIPLE TOOTH EXTRACTIONS    . ORIF SHOULDER FRACTURE Left ~ 2009   "put cement down in it"   . TONSILLECTOMY  1950's?  . TOTAL ABDOMINAL HYSTERECTOMY  1979   Archie Endo 12/12/2010    Social History   Socioeconomic History  . Marital status: Widowed    Spouse name: Not on file  . Number of children: Not on file  . Years of education: Not on file  . Highest education level: Not on file  Occupational History  . Occupation: retired  Tobacco Use  . Smoking status: Former Smoker    Types: Cigarettes  . Smokeless tobacco: Never Used  . Tobacco comment: 05/16/2017"1 pack of cigarettes would last 3-4 months; haven't had any cigarettes for 40 years"  Substance and Sexual Activity  . Alcohol use: Not Currently    Comment: 05/16/2017 "used to drink beer in the 1990's; don't drink at all now"  . Drug use: No  . Sexual activity: Not Currently    Birth control/protection: Abstinence  Other Topics Concern  . Not on file  Social History Narrative   Lives at home alone    Her niece comes to check on her everyday. Her son comes by every other day as well   Right handed   Caffeine: none   Social Determinants of Health   Financial Resource Strain: Low Risk   . Difficulty of Paying Living Expenses: Not hard at all  Food Insecurity: No Food Insecurity  . Worried About Charity fundraiser in the Last Year: Never true  . Ran Out of Food in the Last Year: Never true  Transportation Needs: No Transportation Needs  . Lack of Transportation (Medical): No  . Lack of  Transportation (Non-Medical): No  Physical Activity: Inactive  . Days of Exercise per Week: 0 days  . Minutes of Exercise per Session: 0 min  Stress: No Stress Concern Present  . Feeling of Stress : Not at all  Social Connections:   . Frequency of Communication with Friends and Family: Not on file  . Frequency of Social Gatherings with Friends and Family: Not on file  . Attends Religious Services: Not on file  . Active Member of Clubs or Organizations: Not on file  . Attends Archivist Meetings: Not on file  . Marital Status: Not on file  Intimate Partner Violence: Not At Risk  . Fear of Current or Ex-Partner: No  . Emotionally Abused: No  . Physically Abused: No  . Sexually Abused: No    No Known Allergies  Family History  Problem Relation Age of Onset  . Hypertension Mother   . Heart attack Father   . Diabetes Mellitus II Sister   . Hypertension Sister   . Cancer - Other Sister   . Migraines Niece   . Colon cancer Neg Hx     Prior to Admission medications   Medication Sig Start Date End Date Taking? Authorizing Provider  albuterol (ACCUNEB) 0.63 MG/3ML nebulizer solution Take 2 ampules by nebulization every 6 (six) hours as needed for wheezing.   Yes [provider]  aspirin EC 81 MG tablet Take 1 tablet (81 mg total) by mouth daily. Patient taking differently: Take 81 mg by mouth every Monday, Wednesday, and Friday.  04/09/14  Yes Debbe Odea, MD  atorvastatin (LIPITOR) 10 MG tablet Take 1 Tablet by mouth daily Patient taking differently: Take 10 mg by mouth daily at 6 PM.  07/07/19  Yes Rodriguez-Southworth, Sunday Spillers, PA-C  dicyclomine (BENTYL) 20 MG tablet Take 20 mg by mouth 2 (two) times daily. 10/13/17  Yes [provider]  hydroxychloroquine (PLAQUENIL) 200 MG tablet Take 200 mg by mouth 2 (two) times daily.   Yes [provider]  hydrOXYzine (ATARAX/VISTARIL) 10 MG tablet 1 q 8h prn itching Patient taking differently: Take 10 mg  by mouth every 8 (eight) hours as needed for itching.  04/22/19  Yes Rodriguez-Southworth, Sunday Spillers, PA-C  lisinopril-hydrochlorothiazide (ZESTORETIC) 10-12.5 MG tablet Take 1 tablet by mouth daily. 07/07/19  Yes [provider]  MAGNESIUM-OXIDE 400 (241.3 Mg) MG tablet Take 1 tablet by mouth daily. 06/11/19  Yes [provider]  methylPREDNISolone (MEDROL) 4 MG tablet Take 4 mg by mouth daily.   Yes Rosita Kea, PA-C  metoCLOPramide (REGLAN) 5 MG tablet Take 1-2 tablets (5-10 mg total) by mouth 3 (three) times daily before meals. 06/18/19  Yes Armbruster, Carlota Raspberry, MD  omeprazole (PRILOSEC) 20 MG capsule Take 20 mg by mouth daily. 07/07/19  Yes [provider]  ondansetron (ZOFRAN ODT) 4 MG disintegrating tablet Take 1 tablet (4 mg total) by mouth every 8 (eight) hours as needed for nausea or vomiting. 06/10/19  Yes Rodriguez-Southworth, Sunday Spillers, PA-C  pantoprazole (PROTONIX) 40 MG tablet Take 1 tablet (40 mg total) by mouth 2 (two) times daily. Take 1 tablet, 40 mg, by mouth 2 times daily x6 weeks then 1 tablet, 40 mg once daily indefinitely Patient taking differently: Take 40 mg by mouth daily.  07/06/19 09/04/19 Yes Darliss Cheney, MD  promethazine (PHENERGAN) 25 MG tablet Take 1 tablet (25 mg total) by mouth every 6 (six) hours as needed for nausea or vomiting. 06/17/19  Yes Rodriguez-Southworth, Sunday Spillers, PA-C  simethicone (GAS-X EXTRA STRENGTH) 125 MG chewable tablet Chew 125 mg by mouth every 6 (six) hours as needed for flatulence.   Yes [provider]  sucralfate (CARAFATE) 1 GM/10ML suspension Take 10 mLs (1 g total) by mouth 4 (four) times daily -  with meals and at bedtime. 05/26/19  Yes Kayleen Memos, DO  glucose blood (ONETOUCH VERIO) test strip Use to check blood sugar 3 to 4 times a day before meals 03/25/19   Rodriguez-Southworth, Sunday Spillers, PA-C  Magnesium 400 MG TABS Take 400 mg by mouth every morning for 30 days, THEN 400 mg every morning. Patient not taking:  Reported on 07/12/2019 06/11/19 08/10/19  Rodriguez-Southworth, Sunday Spillers, PA-C  traMADol Veatrice Bourbon) 50 MG tablet One qhs prn pain Patient not taking: Reported on 07/12/2019 06/10/19   Rodriguez-Southworth, Sunday Spillers, PA-C    Physical Exam:  Vitals:   07/12/19 0845 07/12/19 0915 07/12/19 0945 07/12/19 1015  BP: (!) 111/58 (!) 108/55 (!) 106/57 (!) 105/52  Pulse: (!) 112 (!) 107 (!) 109 (!) 105  Resp: (!) 23 20 20  (!) 21  Temp:      TempSrc:      SpO2: 95% 94% 96% 95%  Weight:         . General:  Appears calm and comfortable and is NAD, weak, frail . Eyes:  PERRL, EOMI, normal lids, iris . ENT:  grossly normal hearing, lips & tongue, mmm . Neck:  no LAD, masses or thyromegaly . Cardiovascular:  RR with mild tachycardia, no m/r/g. No LE edema.  Marland Kitchen Respiratory:   CTA bilaterally with no wheezes/rales/rhonchi.  Normal respiratory effort. . Abdomen:  soft, midepigastric and LUQ TTP, ND, NABS . Skin:  no rash or induration seen on limited exam . Musculoskeletal:  grossly normal tone BUE/BLE, good ROM, no bony abnormality; chronic hand deformities associated with RA . Psychiatric:  grossly normal mood and affect, speech fluent and appropriate, AOx3 . Neurologic:  CN 2-12 grossly intact, moves all extremities in coordinated fashion, sensation intact    Radiological Exams on Admission: DG Chest Port 1 View  Result Date: 07/12/2019 CLINICAL DATA:  Knee pain after fall. Dizziness for weeks. Dizziness when standing. EXAM: PORTABLE CHEST 1 VIEW COMPARISON:  05/30/2019 FINDINGS: Chronic elevation of right hemidiaphragm. Lungs are clear. No focal airspace disease. No pulmonary edema. No pneumothorax. Left upper quadrant calcification is chronic in represents an exophytic gastric mass is seen on prior cross-sectional imaging. No acute osseous abnormalities are seen. Minimal branching hyperdensity consistent with methylmethacrylate within the right lung from prior vertebral augmentation, unchanged.  IMPRESSION: 1. No acute abnormality. 2. Chronic elevation of the right hemidiaphragm. Electronically Signed   By: Keith Rake M.D.   On: 07/12/2019 05:16   DG Knee Complete 4 Views Left  Result Date: 07/12/2019 CLINICAL DATA:  Left knee pain after fall. EXAM: LEFT KNEE - COMPLETE 4+ VIEW COMPARISON:  None. FINDINGS: No evidence of fracture, dislocation, or joint effusion. Medial tibiofemoral joint space narrowing with peripheral spurring. There is a prominent quadriceps tendon enthesophyte. Incidental note of vascular calcifications. IMPRESSION: 1. No fracture or subluxation of the left knee. 2. Osteoarthritis of the medial tibiofemoral compartment. Electronically Signed   By: Keith Rake M.D.   On: 07/12/2019 05:14    EKG: Independently reviewed.  Sinus tachcyardia with rate 136; nonspecific ST changes with no evidence of acute ischemia; NSCSLT   Labs on Admission: I have personally reviewed the available labs and imaging studies at the time of the admission.  Pertinent labs:   K+ 3.0 Glucose 179 Anion gap 16 Mag++ 1.5 Albumin 3.3 Lactate 3.2 Normal CBC Normal TSH (elevated TSH) Blood cultures pending BD COVID test negative; Hologic also negative UA: trace LE, + nitrite, 30 protein, many bacteria; urine culture pending   Assessment/Plan Principal Problem:   Unexplained weight loss Active Problems:   Dehydration, mild   Immunosuppression due to chronic steroid use   Rheumatoid arthritis (HCC)   DM (diabetes mellitus) (HCC)   Elevated lactic acid level   COPD (chronic obstructive pulmonary disease) (HCC)   Chronic diastolic CHF (congestive heart failure) ([place])   Essential hypertension   Unexplained weight loss -Patient reports persistent n/v, midepigastric abdominal pain, and inability to eat/drink since October -At PCP appointment on 10/1, she weighed 63.4 kg and she is now 56.8 kg; she was 63.7 kg on 9/24, 19  kg on 9/10, 66.5 on 8/27 -This appears to be GI in  nature and so GI consultation has been requested -Will admit for further evaluation of FTT -Recurrent falls and syncope are thought to be related to this issue; will monitor on telemetry for now -PT/OT consults ordered -Recheck orthostatics in AM  Midepigastric abdominal pain -Patient was previously admitted in last October and early November x 2; EGD with biopsies showed chronic gastritis -She was started on Bentyl BID; Protonix; and Carafate -Symptoms were thought to be possibly related to gastroparesis at most recent clinic visit with Dr. Havery Moros on 11/20 and she was started on Reglan -She has not had relief -10/21 abd/pelvic CT was unrevealing -Repeat EGD may be beneficial at this time -GI consult requested -She received full diet this AM; will change to clears both due to inability to tolerate PO and to accommodate possible EGD tomorrow -Will continue home meds but change PPI to IV Protonix BID for now  Mild dehydration -Elevated lactate with minimal creatinine elevation -Appears mildly dry and this is consistent with her history -Will gently rehydrate and replete K+ and Mag++ -Elevated lactate is thought to be related to dehydration rather than sepsis at this time  RA, on immunosuppression therapy -Continue Plaquenil and daily methylprednisolone for now  DM -Prior A1c was 7.9 in 10/20 -Interestingly, despite uncontrolled DM, she appears to not be taking any medications for this issue at this time; semiglutide was previously prescribed -Will start moderate-scale SSI for now -Untreated DM may be contributing to her overall presentation  COPD -Continue prn Albuterol  HTN -Hold Zestoretic given borderline BP and mild dehydration -Possibly appropriate to resume tomorrow  Chronic diastolic CHF -[id number] echo with impaired diastolic relaxation -Appears to be compensated at this time, will follow with IVF -Continue ASA  HLD -Continue Lipitor   Note: This patient has  been tested and is negative for the novel coronavirus COVID-19.  DVT prophylaxis:  Lovenox  Code Status:  DNR - confirmed with patient/family Family Communication: Niece was present throughout evaluation  Disposition Plan:  Home once clinically improved Consults called: GI;  PT/OT Admission status: Admit - It is my clinical opinion that admission to INPATIENT is reasonable and necessary because of the expectation that this patient will require hospital care that crosses at least 2 midnights to treat this condition based on the medical complexity of the problems presented.  Given the aforementioned information, the predictability of an adverse outcome is felt to be significant.    Karmen Bongo MD [facility]   How to contact the [facility] Attending or Consulting provider [place] or covering provider during after hours [place], for this patient?  1. Check the care team in [facility] and look for a) attending/consulting TR[facility] provider listed and b) the [facility] team listed 2. Log into [email or web address] and use [place]'s universal password to access. If you do not have the password, please contact the hospital operator. 3. Locate the [facility] provider you are looking for under [facility] and page to a number that you can be directly reached. 4. If you still have difficulty reaching the provider, please page the [facility] (Director on Call) for the Hospitalists listed on amion for assistance.   07/12/2019, 10:44 AM

## 2019-07-12 NOTE — ED Notes (Signed)
Attempted report 

## 2019-07-12 NOTE — Consult Note (Signed)
Referring Provider:  Dr. Lorin Mercy, [facility] Primary Care Physician:  [name], PA-C Primary Gastroenterologist:  Dr. Havery Moros  Reason for Consultation:  Nausea, vomiting, epigastric pain  HPI: [name] is a 83 y.o. female with PMH significant for OSA, RA, diabetes, GERD, s/p cholecystectomy.  She was hospitalized from October 21 to October 28 with worsening dizziness and weakness.  She also noted nausea, vomiting and abdominal pain.  She was given empiric antibiotics, there was pyuria on UA but negative urine culture.  Her CT abdomen pelvis did not reveal any clear source of infection.  She had a mildly elevated lipase but no evidence of pancreatitis on CT scan.  She underwent an endoscopy with Dr. Rush Landmark on 05/25/19 which showed a stable subepithelial gastric lesion thought to be benign, she is had an extensive work-up for that in the past including EUS.  She had some gastric nodules which were biopsied and negative, negative testing for H. pylori.  She also had negative biopsies of the small bowel and esophagus.  She unfortunately was readmitted to the hospital from October 31 to November 2.  At that time she presented with severe pain diffusely.  It was learned that she had run out of her Protonix and had run out of her Medrol tablets which she takes chronically for her rheumatoid arthritis.  These were resumed and she felt better and was discharged.   She returned to the office to see Dr. Havery Moros on 11/20 with ongoing symptoms of nausea and vomiting.  She reported the following:  "for the past 3 weeks her p.o. intake has been poor, she is not doing well with solids, does better with liquids although is frequently nauseated and has had intermittent vomiting.  She states she feels full easily with early satiety.  She denies any abdominal pain at this point, her previous pain is since resolved.  She thinks she is lost 15 pounds in the past few months.  She had been taking  Zofran as needed which has not helped much, she was recently given Phenergan and took 1 dose yesterday which did not seem to help too much."  She reported taking Medrol for her rheumatoid arthritis but was unsure of the dose and is not listed on her medications today.  She does have a history of diabetes, last hemoglobin A1c is 7.9 but she is not taking any medications for this.  She is watching her glucose levels at home.  On review of her chart she did have a remote gastric emptying study in 2004 which showed evidence of gastroparesis.  She states she think she remembers being told she may have had this but has not been on Reglan in the past that she is aware or specific treatment for this.  After that visit Dr. Havery Moros contacted her PCP who was not aware of her being on Medrol.  Subsequently a fasting cortisol level was normal.  She is here at [facility] again with the same complaints of nausea, vomiting, epigastric pain, poor PO intake, weight loss.  Says that she cannot hardly keep anything down.  Continues to lose weight.  Says that she just walks around gagging all the time.  Says that she has also been very dizzy and "fell out" yesterday.  Temp 100.1 in the ED.  Urine looks dirty.  GI medications include Bentyl 20 mg twice daily, Protonix 40 mg twice daily, Zofran as needed, Phenergan as needed, Reglan three times a day, Carafate suspension 4 times daily.  She says that she has been taking all of these as directed.  Endoscopic history: 2006 colonoscopyby Dr. Vladimir Faster. 2, tubular adenomatous, polyps removed. 08/2017 colonoscopy. Dr Havery Moros.For weight loss and surveillance of adenomatous polyps.Flat,10 mm cecal polyp.three, 4 to 6 mm,sessile polyps in transverse colon. Transverse and sigmoid diverticulosis. Internal hemorrhoids. Pathology on the polyps were adenomatous, no H GD. 2/2019EGD. Subepithelial gastric nodule/mass not bleeding and no stigmata of recent bleeding in the  gastric fundus. Biopsied (fundic gland polyp). Small subepithelial nodules in the gastric body, biopsied (gastric, antral and oxyntic mucosa with no specific histopathologic changes. No H. Pylori). 3/2019Upper EUS. For evaluation of gastric nodule:2.3 cm, subepithelial lesioninproximal stomach correlates with densely calcified lesion that appears to involve the wall of the stomach. No evident associated soft tissue involvement. Otherwise normal gastric wall. Limited views of liver, pancreas, spleen all normal. The subepithelial lesion correlates with a heavily calcified lesion in the proximal gastric wall that has been present by imaging since at least 2007 and has not changed significantly.    EGD 05/25/19 - No gross mucosal lesions in esophagus. Tortuous esophagus. Z-line irregular, 36 cm from the incisors. 1 cm hiatal hernia. - Submucosal lesion in the cardia, previously tunnel biopsied and EUS performed by partner and present on CT Imaging without significant change for >13-years. - Two submucosal papules (nodules) found in the stomach. Tunnel biopsied the larger of these lesions.. - Erythematous mucosa in the stomach. Biopsied for HP. - No gross lesions in the duodenal bulb, in the first portion of the duodenum and in the second portion of the duodenum. Biopsied for enteropathy rule out.    Past Medical History:  Diagnosis Date  . Allergy   . Anemia    "when I was young"  . Anginal pain ([place])   . Anxiety   . Arthritis    "all over my body"   . Chest pain 04/07/2014  . Chronic diastolic heart failure, NYHA class 1 ([place]) 01/22/2015  . Chronic lower back pain   . Chronic steroid use 12/11/2012  . COPD (chronic obstructive pulmonary disease) ([place]) 01/09/2017  . 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 Elevated lactic acid level 01/22/2015  . Essential hypertension 06/15/2018  . Frequent UTI    "recently" (06/14/2015)  . Gastric mass   .  GERD (gastroesophageal reflux disease)   . Headache(784.0)    "weekly; wake up w/them " (110/19/2018)  . Heart murmur   . High cholesterol   . History of blood transfusion ~ 1954   "related to my periods"  . Migraine    "once/month maybe" (05/16/2017)  . Multiple falls 06/14/2015  . Noncompliance 12/11/2012  . OSA (obstructive sleep apnea)    "I think I'm using a BiPAP" (06/14/2015)  . Sepsis ([place]) 01/09/2017  . Syncope and collapse 12/11/2012  . Tachycardia with heart rate 100-120 beats per minute 04/08/2014  . Type II diabetes mellitus ([place])   . Uncontrolled type 2 diabetes mellitus with complication [facility])     Past Surgical History:  Procedure Laterality Date  . APPENDECTOMY  1970   Archie Endo 12/12/2010  . BACK SURGERY    . BIOPSY  05/25/2019   Procedure: BIOPSY;  Surgeon: Rush Landmark Telford Nab., MD;  Location: [place];  Service: Gastroenterology;;  . [place]   Archie Endo 12/12/2010  . ESOPHAGOGASTRODUODENOSCOPY N/A 05/25/2019   Procedure: ESOPHAGOGASTRODUODENOSCOPY (EGD);  Surgeon: Irving Copas., MD;  Location: [place];  Service: Gastroenterology;  Laterality: N/A;  .  EUS N/A 10/16/2017   Procedure: UPPER ENDOSCOPIC ULTRASOUND (EUS) RADIAL;  Surgeon: Milus Banister, MD;  Location: [facility];  Service: Endoscopy;  Laterality: N/A;  . FRACTURE SURGERY    . LAPAROSCOPIC CHOLECYSTECTOMY  1990's  . LUMBAR MICRODISCECTOMY Left 09/2006   L5-S1/notes 12/12/2010  . MULTIPLE EXTRACTIONS WITH ALVEOLOPLASTY Bilateral 06/13/2017   Procedure: MULTIPLE EXTRACTION WITH ALVEOLOPLASTY;  Surgeon: Diona Browner, DDS;  Location: [place];  Service: Oral Surgery;  Laterality: Bilateral;  . MULTIPLE TOOTH EXTRACTIONS    . ORIF SHOULDER FRACTURE Left ~ 2009   "put cement down in it"   . TONSILLECTOMY  1950's?  . TOTAL ABDOMINAL HYSTERECTOMY  1979   Archie Endo 12/12/2010    Prior to Admission medications   Medication Sig Start Date End Date Taking? Authorizing  Provider  albuterol (ACCUNEB) 0.63 MG/3ML nebulizer solution Take 2 ampules by nebulization every 6 (six) hours as needed for wheezing.   Yes [provider]  aspirin EC 81 MG tablet Take 1 tablet (81 mg total) by mouth daily. Patient taking differently: Take 81 mg by mouth every Monday, Wednesday, and Friday.  04/09/14  Yes Debbe Odea, MD  atorvastatin (LIPITOR) 10 MG tablet Take 1 Tablet by mouth daily Patient taking differently: Take 10 mg by mouth daily at 6 PM.  07/07/19  Yes Rodriguez-Southworth, Sunday Spillers, PA-C  dicyclomine (BENTYL) 20 MG tablet Take 20 mg by mouth 2 (two) times daily. 10/13/17  Yes [provider]  hydroxychloroquine (PLAQUENIL) 200 MG tablet Take 200 mg by mouth 2 (two) times daily.   Yes [provider]  hydrOXYzine (ATARAX/VISTARIL) 10 MG tablet 1 q 8h prn itching Patient taking differently: Take 10 mg by mouth every 8 (eight) hours as needed for itching.  04/22/19  Yes Rodriguez-Southworth, Sunday Spillers, PA-C  lisinopril-hydrochlorothiazide (ZESTORETIC) 10-12.5 MG tablet Take 1 tablet by mouth daily. 07/07/19  Yes [provider]  MAGNESIUM-OXIDE 400 (241.3 Mg) MG tablet Take 1 tablet by mouth daily. 06/11/19  Yes [provider]  methylPREDNISolone (MEDROL) 4 MG tablet Take 4 mg by mouth daily.   Yes Rosita Kea, PA-C  metoCLOPramide (REGLAN) 5 MG tablet Take 1-2 tablets (5-10 mg total) by mouth 3 (three) times daily before meals. 06/18/19  Yes Armbruster, Carlota Raspberry, MD  ondansetron (ZOFRAN ODT) 4 MG disintegrating tablet Take 1 tablet (4 mg total) by mouth every 8 (eight) hours as needed for nausea or vomiting. 06/10/19  Yes Rodriguez-Southworth, Sunday Spillers, PA-C  pantoprazole (PROTONIX) 40 MG tablet Take 1 tablet (40 mg total) by mouth 2 (two) times daily. Take 1 tablet, 40 mg, by mouth 2 times daily x6 weeks then 1 tablet, 40 mg once daily indefinitely Patient taking differently: Take 40 mg by mouth daily.  07/06/19 09/04/19 Yes Darliss Cheney, MD  promethazine (PHENERGAN) 25 MG tablet Take 1 tablet (25 mg total) by mouth every 6 (six) hours as needed for nausea or vomiting. 06/17/19  Yes Rodriguez-Southworth, Sunday Spillers, PA-C  simethicone (GAS-X EXTRA STRENGTH) 125 MG chewable tablet Chew 125 mg by mouth every 6 (six) hours as needed for flatulence.   Yes [provider]  sucralfate (CARAFATE) 1 GM/10ML suspension Take 10 mLs (1 g total) by mouth 4 (four) times daily -  with meals and at bedtime. 05/26/19  Yes Kayleen Memos, DO  glucose blood (ONETOUCH VERIO) test strip Use to check blood sugar 3 to 4 times a day before meals 03/25/19   Rodriguez-Southworth, Brilliant, PA-C    Current Facility-Administered Medications  Medication  Dose Route Frequency Provider Last Rate Last Admin  . 0.9 % NaCl with KCl 20 mEq/ L  infusion   Intravenous Continuous Karmen Bongo, MD      . acetaminophen (TYLENOL) tablet 650 mg  650 mg Oral Q6H PRN Karmen Bongo, MD       Or  . acetaminophen (TYLENOL) suppository 650 mg  650 mg Rectal Q6H PRN Karmen Bongo, MD      . albuterol (ACCUNEB) nebulizer solution 1.26 mg  2 ampule Nebulization Q6H PRN Karmen Bongo, MD      . aspirin EC tablet 81 mg  81 mg Oral Q M,W,F Karmen Bongo, MD      . atorvastatin (LIPITOR) tablet 10 mg  10 mg Oral q1800 Karmen Bongo, MD      . bisacodyl (DULCOLAX) EC tablet 5 mg  5 mg Oral Daily PRN Karmen Bongo, MD      . dicyclomine (BENTYL) tablet 20 mg  20 mg Oral BID Karmen Bongo, MD      . docusate sodium (COLACE) capsule 100 mg  100 mg Oral BID Karmen Bongo, MD      . enoxaparin (LOVENOX) injection 40 mg  40 mg Subcutaneous Q24H Karmen Bongo, MD      . hydroxychloroquine (PLAQUENIL) tablet 200 mg  200 mg Oral BID Karmen Bongo, MD      . hydrOXYzine (ATARAX/VISTARIL) tablet 10 mg  10 mg Oral Q8H PRN Karmen Bongo, MD      . magnesium oxide (MAG-OX) tablet 400 mg  400 mg Oral Daily Karmen Bongo, MD      . methylPREDNISolone (MEDROL)  tablet 4 mg  4 mg Oral Daily Karmen Bongo, MD      . metoCLOPramide (REGLAN) tablet 5-10 mg  5-10 mg Oral TID Meliton Rattan, MD      . ondansetron [facility]) tablet 4 mg  4 mg Oral Q6H PRN Karmen Bongo, MD       Or  . ondansetron [facility]) injection 4 mg  4 mg Intravenous Q6H PRN Karmen Bongo, MD      . polyethylene glycol (MIRALAX / GLYCOLAX) packet 17 g  17 g Oral Daily PRN Karmen Bongo, MD      . sodium chloride flush (NS) 0.9 % injection 3 mL  3 mL Intravenous Q12H Karmen Bongo, MD      . sodium phosphate (FLEET) 7-19 GM/118ML enema 1 enema  1 enema Rectal Once PRN Karmen Bongo, MD      . sucralfate (CARAFATE) 1 GM/10ML suspension 1 g  1 g Oral TID WC & HS Karmen Bongo, MD       Current Outpatient Medications  Medication Sig Dispense Refill  . albuterol (ACCUNEB) 0.63 MG/3ML nebulizer solution Take 2 ampules by nebulization every 6 (six) hours as needed for wheezing.    Marland Kitchen aspirin EC 81 MG tablet Take 1 tablet (81 mg total) by mouth daily. (Patient taking differently: Take 81 mg by mouth every Monday, Wednesday, and Friday. )    . atorvastatin (LIPITOR) 10 MG tablet Take 1 Tablet by mouth daily (Patient taking differently: Take 10 mg by mouth daily at 6 PM. ) 90 tablet 1  . dicyclomine (BENTYL) 20 MG tablet Take 20 mg by mouth 2 (two) times daily.  1  . hydroxychloroquine (PLAQUENIL) 200 MG tablet Take 200 mg by mouth 2 (two) times daily.    . hydrOXYzine (ATARAX/VISTARIL) 10 MG tablet 1 q 8h prn itching (Patient taking differently: Take 10 mg by mouth every 8 (  eight) hours as needed for itching. ) 30 tablet 0  . lisinopril-hydrochlorothiazide (ZESTORETIC) 10-12.5 MG tablet Take 1 tablet by mouth daily.    Marland Kitchen MAGNESIUM-OXIDE 400 (241.3 Mg) MG tablet Take 1 tablet by mouth daily.    . methylPREDNISolone (MEDROL) 4 MG tablet Take 4 mg by mouth daily.    . metoCLOPramide (REGLAN) 5 MG tablet Take 1-2 tablets (5-10 mg total) by mouth 3 (three) times daily before meals. 90  tablet 1  . ondansetron (ZOFRAN ODT) 4 MG disintegrating tablet Take 1 tablet (4 mg total) by mouth every 8 (eight) hours as needed for nausea or vomiting. 30 tablet 0  . pantoprazole (PROTONIX) 40 MG tablet Take 1 tablet (40 mg total) by mouth 2 (two) times daily. Take 1 tablet, 40 mg, by mouth 2 times daily x6 weeks then 1 tablet, 40 mg once daily indefinitely (Patient taking differently: Take 40 mg by mouth daily. ) 120 tablet 0  . promethazine (PHENERGAN) 25 MG tablet Take 1 tablet (25 mg total) by mouth every 6 (six) hours as needed for nausea or vomiting. 30 tablet 0  . simethicone (GAS-X EXTRA STRENGTH) 125 MG chewable tablet Chew 125 mg by mouth every 6 (six) hours as needed for flatulence.    . sucralfate (CARAFATE) 1 GM/10ML suspension Take 10 mLs (1 g total) by mouth 4 (four) times daily -  with meals and at bedtime. 420 mL 0  . glucose blood (ONETOUCH VERIO) test strip Use to check blood sugar 3 to 4 times a day before meals 100 each 12    Allergies as of 07/12/2019  . (No Known Allergies)    Family History  Problem Relation Age of Onset  . Hypertension Mother   . Heart attack Father   . Diabetes Mellitus II Sister   . Hypertension Sister   . Cancer - Other Sister   . Migraines Niece   . Colon cancer Neg Hx     Social History   Socioeconomic History  . Marital status: Widowed    Spouse name: Not on file  . Number of children: Not on file  . Years of education: Not on file  . Highest education level: Not on file  Occupational History  . Occupation: retired  Tobacco Use  . Smoking status: Former Smoker    Types: Cigarettes  . Smokeless tobacco: Never Used  . Tobacco comment: 05/16/2017"1 pack of cigarettes would last 3-4 months; haven't had any cigarettes for 40 years"  Substance and Sexual Activity  . Alcohol use: Not Currently    Comment: 05/16/2017 "used to drink beer in the 1990's; don't drink at all now"  . Drug use: No  . Sexual activity: Not Currently     Birth control/protection: Abstinence  Other Topics Concern  . Not on file  Social History Narrative   Lives at home alone    Her niece comes to check on her everyday. Her son comes by every other day as well   Right handed   Caffeine: none   Social Determinants of Health   Financial Resource Strain: Low Risk   . Difficulty of Paying Living Expenses: Not hard at all  Food Insecurity: No Food Insecurity  . Worried About Charity fundraiser in the Last Year: Never true  . Ran Out of Food in the Last Year: Never true  Transportation Needs: No Transportation Needs  . Lack of Transportation (Medical): No  . Lack of Transportation (Non-Medical): No  Physical Activity: Inactive  .  Days of Exercise per Week: 0 days  . Minutes of Exercise per Session: 0 min  Stress: No Stress Concern Present  . Feeling of Stress : Not at all  Social Connections:   . Frequency of Communication with Friends and Family: Not on file  . Frequency of Social Gatherings with Friends and Family: Not on file  . Attends Religious Services: Not on file  . Active Member of Clubs or Organizations: Not on file  . Attends Archivist Meetings: Not on file  . Marital Status: Not on file  Intimate Partner Violence: Not At Risk  . Fear of Current or Ex-Partner: No  . Emotionally Abused: No  . Physically Abused: No  . Sexually Abused: No    Review of Systems: ROS is O/W negative except as mentioned in HPI.   Physical Exam: Vital signs in last 24 hours: Temp:  [99.6 F (37.6 C)-100.1 F (37.8 C)] 100.1 F (37.8 C) (12/14 0651) Pulse Rate:  [106-136] 106 (12/14 0730) Resp:  [16-30] 19 (12/14 0730) BP: (110-134)/(57-93) 134/61 (12/14 0730) SpO2:  [81 %-99 %] 95 % (12/14 0730) Weight:  [56.8 kg] 56.8 kg (12/14 0407)   General:  Alert, Well-developed, well-nourished, pleasant and cooperative in NAD Head:  Normocephalic and atraumatic. Eyes:  Sclera clear, no icterus.  Conjunctiva pink. Ears:  Normal  auditory acuity. Mouth:  No deformity or lesions.   Lungs:  Clear throughout to auscultation.  No wheezes, crackles, or rhonchi.  Heart:  Slightly tachy.  No M/R/G. Abdomen:  Soft, non-distended.  BS present.  Non-tender.   Msk:  Symmetrical without gross deformities. Pulses:  Normal pulses noted. Extremities:  Without clubbing or edema. Neurologic:  Alert and oriented x 4;  grossly normal neurologically. Skin:  Intact without significant lesions or rashes. Psych:  Alert and cooperative. Normal mood and affect.  Lab Results: Recent Labs    07/12/19 0408  WBC 9.9  HGB 13.3  HCT 41.7  PLT 351   BMET Recent Labs    07/12/19 0408  NA 139  K 3.0*  CL 99  CO2 24  GLUCOSE 179*  BUN 13  CREATININE 1.16*  CALCIUM 9.6   LFT Recent Labs    07/12/19 0418  PROT 6.7  ALBUMIN 3.3*  AST 34  ALT 16  ALKPHOS 42  BILITOT 0.7  BILIDIR 0.2  IBILI 0.5   Studies/Results: DG Chest Port 1 View  Result Date: 07/12/2019 CLINICAL DATA:  Knee pain after fall. Dizziness for weeks. Dizziness when standing. EXAM: PORTABLE CHEST 1 VIEW COMPARISON:  05/30/2019 FINDINGS: Chronic elevation of right hemidiaphragm. Lungs are clear. No focal airspace disease. No pulmonary edema. No pneumothorax. Left upper quadrant calcification is chronic in represents an exophytic gastric mass is seen on prior cross-sectional imaging. No acute osseous abnormalities are seen. Minimal branching hyperdensity consistent with methylmethacrylate within the right lung from prior vertebral augmentation, unchanged. IMPRESSION: 1. No acute abnormality. 2. Chronic elevation of the right hemidiaphragm. Electronically Signed   By: Keith Rake M.D.   On: 07/12/2019 05:16   DG Knee Complete 4 Views Left  Result Date: 07/12/2019 CLINICAL DATA:  Left knee pain after fall. EXAM: LEFT KNEE - COMPLETE 4+ VIEW COMPARISON:  None. FINDINGS: No evidence of fracture, dislocation, or joint effusion. Medial tibiofemoral joint space  narrowing with peripheral spurring. There is a prominent quadriceps tendon enthesophyte. Incidental note of vascular calcifications. IMPRESSION: 1. No fracture or subluxation of the left knee. 2. Osteoarthritis of the medial tibiofemoral compartment.  Electronically Signed   By: Keith Rake M.D.   On: 07/12/2019 05:14   IMPRESSION:  *83 year old female with ongoing complaints of nausea, vomiting, early satiety, epigastric pain, weight loss, poor PO intake/FTT:  So far extensive evaluation has been unremarkable recently.  She was recently started on Reglan by Dr. Havery Moros in November for possible diabetic gastroparesis that was seen on a GES in 2004.  Was started on 5 mg TID before meals, to increase to 10 mg if needed.  Also on PPI BID, carafate suspension four times daily, zofran prn, phenergan prn. *? UTI:  Will defer to hospitalists.  PLAN: -Will start Reglan 10 mg IV ACHS. -Doubt that another endoscopy will show any new findings to account for symptoms. -Continue BID PPI IV as well as carafate suspension four times daily, other antiemetics prn for now.  Laban Emperor. Dannya Pitkin  07/12/2019, 9:18 AM

## 2019-07-12 NOTE — ED Provider Notes (Signed)
[facility] EMERGENCY DEPARTMENT Provider Note   CSN: [id number] Arrival date & time: 07/12/19  [id number]     History Chief Complaint  Patient presents with  . Dizziness  . Loss of Consciousness  . Knee Pain  . Emesis    [name] is a 83 y.o. female.  83 yo F with a chief complaint of nausea and vomiting.  This is been an ongoing issue for her.  She states has been ongoing since she was in the hospital.  Has been in the hospital already twice a couple months ago.  Patient feels that her nausea is worsened.  Does feel like she can eat or drink anything.  Having some fevers off and on.  Denies cough or congestion.  She was trying to walk to the bathroom and she collapsed to the ground she thinks because she is so weak.  After she fell was complaining of some left knee pain.  Hurts mostly to the posterior aspect.  The history is provided by the patient.  Dizziness Associated symptoms: syncope and vomiting   Associated symptoms: no chest pain, no headaches, no nausea, no palpitations and no shortness of breath   Loss of Consciousness Associated symptoms: dizziness and vomiting   Associated symptoms: no chest pain, no fever, no headaches, no nausea, no palpitations and no shortness of breath   Knee Pain Associated symptoms: no fever   Emesis Associated symptoms: no arthralgias, no chills, no fever, no headaches and no myalgias   Illness Severity:  Moderate Onset quality:  Gradual Duration:  3 weeks Timing:  Constant Progression:  Worsening Chronicity:  New Associated symptoms: vomiting   Associated symptoms: no chest pain, no congestion, no fever, no headaches, no myalgias, no nausea, no rhinorrhea, no shortness of breath and no wheezing        Past Medical History:  Diagnosis Date  . Allergy   . Anemia    "when I was young"  . Anginal pain ([place])   . Anxiety   . Arthritis    "all over my body"   . Chest pain 04/07/2014  . Chronic diastolic heart  failure, NYHA class 1 ([place]) 01/22/2015  . Chronic lower back pain   . Chronic steroid use 12/11/2012  . COPD (chronic obstructive pulmonary disease) ([place]) 01/09/2017  . 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 Elevated lactic acid level 01/22/2015  . Essential hypertension 06/15/2018  . Frequent UTI    "recently" (06/14/2015)  . Gastric mass   . GERD (gastroesophageal reflux disease)   . Headache(784.0)    "weekly; wake up w/them " (110/19/2018)  . Heart murmur   . High cholesterol   . History of blood transfusion ~ 1954   "related to my periods"  . Migraine    "once/month maybe" (05/16/2017)  . Multiple falls 06/14/2015  . Noncompliance 12/11/2012  . OSA (obstructive sleep apnea)    "I think I'm using a BiPAP" (06/14/2015)  . Sepsis ([place]) 01/09/2017  . Syncope and collapse 12/11/2012  . Tachycardia with heart rate 100-120 beats per minute 04/08/2014  . Type II diabetes mellitus ([place])   . Uncontrolled type 2 diabetes mellitus with complication [facility])     Patient Active Problem List   Diagnosis Date Noted  . Unexplained weight loss 07/12/2019  . Non-intractable vomiting   . Nausea, vomiting, and diarrhea 05/30/2019  . Hypokalemia 05/30/2019  . Hypomagnesemia 05/30/2019  . Gastritis 05/30/2019  . Elevated lipase 05/30/2019  . SIRS (  systemic inflammatory response syndrome) ([place]) 05/30/2019  . History of COPD   . Other chronic pain 04/22/2019  . Dyspnea 08/10/2018  . Acute diastolic heart failure ([place]) 06/15/2018  . Essential hypertension 06/15/2018  . 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 Gastric mass   . Sepsis secondary to UTI ([place])   . Uncontrolled type 2 diabetes mellitus with complication ([place])   . Acute renal failure ([place])   . Chronic diastolic CHF (congestive heart failure) ([place])   . E-coli UTI 01/09/2017  . Sepsis ([place]) 01/09/2017  . Constipation 01/09/2017  . COPD (chronic obstructive pulmonary disease) ([place]) 01/09/2017  . Multiple falls  06/14/2015  . Weight loss 06/14/2015  . Acute respiratory failure with hypoxia ([place]) 06/14/2015  . Orthostasis 06/14/2015  . Acute respiratory failure ([place]) 06/14/2015  . Elevated lactic acid level 01/22/2015  . 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 Chronic diastolic heart failure, NYHA class 1 ([place]) 01/22/2015  . Tachycardia with heart rate 100-120 beats per minute 04/08/2014  . DM (diabetes mellitus) ([place]) 04/08/2014  . Chest pain 04/07/2014  . Hyperglycemia 12/11/2012  . Syncope and collapse 12/11/2012  . Dehydration, mild 12/11/2012  . Noncompliance 12/11/2012  . UTI (lower urinary tract infection) 12/11/2012  . 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 Rheumatoid arthritis ([place]) 12/11/2012    Past Surgical History:  Procedure Laterality Date  . APPENDECTOMY  1970   Archie Endo 12/12/2010  . BACK SURGERY    . BIOPSY  05/25/2019   Procedure: BIOPSY;  Surgeon: Rush Landmark Telford Nab., MD;  Location: [facility];  Service: Gastroenterology;;  . [place]   Archie Endo 12/12/2010  . ESOPHAGOGASTRODUODENOSCOPY N/A 05/25/2019   Procedure: ESOPHAGOGASTRODUODENOSCOPY (EGD);  Surgeon: Irving Copas., MD;  Location: [facility];  Service: Gastroenterology;  Laterality: N/A;  . EUS N/A 10/16/2017   Procedure: UPPER ENDOSCOPIC ULTRASOUND (EUS) RADIAL;  Surgeon: Milus Banister, MD;  Location: [facility];  Service: Endoscopy;  Laterality: N/A;  . FRACTURE SURGERY    . LAPAROSCOPIC CHOLECYSTECTOMY  1990's  . LUMBAR MICRODISCECTOMY Left 09/2006   L5-S1/notes 12/12/2010  . MULTIPLE EXTRACTIONS WITH ALVEOLOPLASTY Bilateral 06/13/2017   Procedure: MULTIPLE EXTRACTION WITH ALVEOLOPLASTY;  Surgeon: Diona Browner, DDS;  Location: [facility];  Service: Oral Surgery;  Laterality: Bilateral;  . MULTIPLE TOOTH EXTRACTIONS    . ORIF SHOULDER FRACTURE Left ~ 2009   "put cement down in it"   . TONSILLECTOMY  1950's?  . TOTAL ABDOMINAL HYSTERECTOMY  1979   Archie Endo  12/12/2010     OB History   No obstetric history on file.     Family History  Problem Relation Age of Onset  . Hypertension Mother   . Heart attack Father   . Diabetes Mellitus II Sister   . Hypertension Sister   . Cancer - Other Sister   . Migraines Niece   . Colon cancer Neg Hx     Social History   Tobacco Use  . Smoking status: Former Smoker    Types: Cigarettes  . Smokeless tobacco: Never Used  . Tobacco comment: 05/16/2017"1 pack of cigarettes would last 3-4 months; haven't had any cigarettes for 40 years"  Substance Use Topics  . Alcohol use: Not Currently    Comment: 05/16/2017 "used to drink beer in the 1990's; don't drink at all now"  . Drug use: No    Home Medications Prior to Admission medications   Medication Sig Start Date End Date Taking? Authorizing Provider  albuterol (ACCUNEB) 0.63 MG/3ML nebulizer solution  Take 2 ampules by nebulization every 6 (six) hours as needed for wheezing.   Yes [provider]  aspirin EC 81 MG tablet Take 1 tablet (81 mg total) by mouth daily. Patient taking differently: Take 81 mg by mouth every Monday, Wednesday, and Friday.  04/09/14  Yes Debbe Odea, MD  atorvastatin (LIPITOR) 10 MG tablet Take 1 Tablet by mouth daily Patient taking differently: Take 10 mg by mouth daily at 6 PM.  07/07/19  Yes Rodriguez-Southworth, Sunday Spillers, PA-C  dicyclomine (BENTYL) 20 MG tablet Take 20 mg by mouth 2 (two) times daily. 10/13/17  Yes [provider]  hydroxychloroquine (PLAQUENIL) 200 MG tablet Take 200 mg by mouth 2 (two) times daily.   Yes [provider]  hydrOXYzine (ATARAX/VISTARIL) 10 MG tablet 1 q 8h prn itching Patient taking differently: Take 10 mg by mouth every 8 (eight) hours as needed for itching.  04/22/19  Yes Rodriguez-Southworth, Sunday Spillers, PA-C  lisinopril-hydrochlorothiazide (ZESTORETIC) 10-12.5 MG tablet Take 1 tablet by mouth daily. 07/07/19  Yes [provider]  MAGNESIUM-OXIDE 400 (241.3  Mg) MG tablet Take 1 tablet by mouth daily. 06/11/19  Yes [provider]  methylPREDNISolone (MEDROL) 4 MG tablet Take 4 mg by mouth daily.   Yes Rosita Kea, PA-C  metoCLOPramide (REGLAN) 5 MG tablet Take 1-2 tablets (5-10 mg total) by mouth 3 (three) times daily before meals. 06/18/19  Yes Armbruster, Carlota Raspberry, MD  ondansetron (ZOFRAN ODT) 4 MG disintegrating tablet Take 1 tablet (4 mg total) by mouth every 8 (eight) hours as needed for nausea or vomiting. 06/10/19  Yes Rodriguez-Southworth, Sunday Spillers, PA-C  pantoprazole (PROTONIX) 40 MG tablet Take 1 tablet (40 mg total) by mouth 2 (two) times daily. Take 1 tablet, 40 mg, by mouth 2 times daily x6 weeks then 1 tablet, 40 mg once daily indefinitely Patient taking differently: Take 40 mg by mouth daily.  07/06/19 09/04/19 Yes Darliss Cheney, MD  promethazine (PHENERGAN) 25 MG tablet Take 1 tablet (25 mg total) by mouth every 6 (six) hours as needed for nausea or vomiting. 06/17/19  Yes Rodriguez-Southworth, Sunday Spillers, PA-C  simethicone (GAS-X EXTRA STRENGTH) 125 MG chewable tablet Chew 125 mg by mouth every 6 (six) hours as needed for flatulence.   Yes [provider]  sucralfate (CARAFATE) 1 GM/10ML suspension Take 10 mLs (1 g total) by mouth 4 (four) times daily -  with meals and at bedtime. 05/26/19  Yes Kayleen Memos, DO  glucose blood (ONETOUCH VERIO) test strip Use to check blood sugar 3 to 4 times a day before meals 03/25/19   Rodriguez-Southworth, Sunday Spillers, PA-C    Allergies    Patient has no known allergies.  Review of Systems   Review of Systems  Constitutional: Negative for chills and fever.  HENT: Negative for congestion and rhinorrhea.   Eyes: Negative for redness and visual disturbance.  Respiratory: Negative for shortness of breath and wheezing.   Cardiovascular: Positive for syncope. Negative for chest pain and palpitations.  Gastrointestinal: Positive for vomiting. Negative for nausea.  Genitourinary: Negative for  dysuria and urgency.  Musculoskeletal: Negative for arthralgias and myalgias.  Skin: Negative for pallor and wound.  Neurological: Positive for dizziness. Negative for headaches.    Physical Exam Updated Vital Signs BP 127/90 (BP Location: Left Arm)   Pulse (!) 101   Temp 98.1 F (36.7 C) (Oral)   Resp 16   Wt 56.8 kg   SpO2 98%   BMI 22.90 kg/m   Physical Exam Vitals  and nursing note reviewed.  Constitutional:      General: She is not in acute distress.    Appearance: She is well-developed. She is not diaphoretic.     Comments: Chronically ill-appearing  HENT:     Head: Normocephalic and atraumatic.  Eyes:     Pupils: Pupils are equal, round, and reactive to light.  Cardiovascular:     Rate and Rhythm: Regular rhythm. Tachycardia present.     Heart sounds: No murmur. No friction rub. No gallop.   Pulmonary:     Effort: Pulmonary effort is normal.     Breath sounds: No wheezing or rales.  Abdominal:     General: There is no distension.     Palpations: Abdomen is soft.     Tenderness: There is no abdominal tenderness.  Musculoskeletal:        General: Tenderness present.     Cervical back: Normal range of motion and neck supple.     Comments: Tenderness to the posterior aspect of the left knee.  Pain with flexion.  No edema no erythema.  Skin:    General: Skin is warm and dry.  Neurological:     Mental Status: She is alert and oriented to person, place, and time.  Psychiatric:        Behavior: Behavior normal.     ED Results / Procedures / Treatments   Labs (all labs ordered are listed, but only abnormal results are displayed) Labs Reviewed  BASIC METABOLIC PANEL - Abnormal; Notable for the following components:      Result Value   Potassium 3.0 (*)    Glucose, Bld 179 (*)    Creatinine, Ser 1.16 (*)    GFR calc non Af Amer 44 (*)    GFR calc Af Amer 51 (*)    Anion gap 16 (*)    All other components within normal limits  URINALYSIS, ROUTINE W REFLEX  MICROSCOPIC - Abnormal; Notable for the following components:   Color, Urine AMBER (*)    APPearance CLOUDY (*)    Protein, ur 30 (*)    Nitrite POSITIVE (*)    Leukocytes,Ua TRACE (*)    Bacteria, UA MANY (*)    All other components within normal limits  HEPATIC FUNCTION PANEL - Abnormal; Notable for the following components:   Albumin 3.3 (*)    All other components within normal limits  LIPASE, BLOOD - Abnormal; Notable for the following components:   Lipase 86 (*)    All other components within normal limits  T4, FREE - Abnormal; Notable for the following components:   Free T4 2.33 (*)    All other components within normal limits  LACTIC ACID, PLASMA - Abnormal; Notable for the following components:   Lactic Acid, Venous 3.2 (*)    All other components within normal limits  MAGNESIUM - Abnormal; Notable for the following components:   Magnesium 1.5 (*)    All other components within normal limits  CBG MONITORING, ED - Abnormal; Notable for the following components:   Glucose-Capillary 125 (*)    All other components within normal limits  CBG MONITORING, ED - Abnormal; Notable for the following components:   Glucose-Capillary 107 (*)    All other components within normal limits  CULTURE, BLOOD (ROUTINE X 2)  CULTURE, BLOOD (ROUTINE X 2)  SARS CORONAVIRUS 2 (TAT 6-24 HRS)  URINE CULTURE  CBC  TSH  LACTIC ACID, PLASMA  GLUCOSE, CAPILLARY  GLUCOSE, CAPILLARY  GLUCOSE, CAPILLARY  BASIC METABOLIC PANEL  CBC  POC SARS CORONAVIRUS 2 AG -  ED    EKG EKG Interpretation  Date/Time:  Monday July 12 2019 03:55:03 EST Ventricular Rate:  136 PR Interval:  120 QRS Duration: 82 QT Interval:  388 QTC Calculation: 583 R Axis:   -77 Text Interpretation: Sinus tachycardia with Premature atrial complexes Left axis deviation Abnormal ECG qtc manually calculated at 422 No significant change since last tracing Confirmed by Deno Etienne [phone]) on 07/12/2019 4:13:16  AM   Radiology DG Chest Port 1 View  Result Date: 07/12/2019 CLINICAL DATA:  Knee pain after fall. Dizziness for weeks. Dizziness when standing. EXAM: PORTABLE CHEST 1 VIEW COMPARISON:  05/30/2019 FINDINGS: Chronic elevation of right hemidiaphragm. Lungs are clear. No focal airspace disease. No pulmonary edema. No pneumothorax. Left upper quadrant calcification is chronic in represents an exophytic gastric mass is seen on prior cross-sectional imaging. No acute osseous abnormalities are seen. Minimal branching hyperdensity consistent with methylmethacrylate within the right lung from prior vertebral augmentation, unchanged. IMPRESSION: 1. No acute abnormality. 2. Chronic elevation of the right hemidiaphragm. Electronically Signed   By: Keith Rake M.D.   On: 07/12/2019 05:16   DG Knee Complete 4 Views Left  Result Date: 07/12/2019 CLINICAL DATA:  Left knee pain after fall. EXAM: LEFT KNEE - COMPLETE 4+ VIEW COMPARISON:  None. FINDINGS: No evidence of fracture, dislocation, or joint effusion. Medial tibiofemoral joint space narrowing with peripheral spurring. There is a prominent quadriceps tendon enthesophyte. Incidental note of vascular calcifications. IMPRESSION: 1. No fracture or subluxation of the left knee. 2. Osteoarthritis of the medial tibiofemoral compartment. Electronically Signed   By: Keith Rake M.D.   On: 07/12/2019 05:14    Procedures Procedures (including critical care time) Procedure note: Ultrasound Guided Peripheral IV Ultrasound guided peripheral 1.88 inch angiocath IV placement performed by me. Indications: Nursing unable to place IV. Details: The antecubital fossa and upper arm were evaluated with a multifrequency linear probe. Patent brachial veins were noted. 1 attempt was made to cannulate a vein under realtime US guidance with successful cannulation of the vein and catheter placement. There is return of non-pulsatile dark red blood. The patient tolerated the  procedure well without complications. Images archived electronically.  CPT codes: [id number] and [phone]  Medications Ordered in ED Medications  aspirin EC tablet 81 mg (has no administration in time range)  hydroxychloroquine (PLAQUENIL) tablet 200 mg (200 mg Oral Given 07/12/19 2147)  atorvastatin (LIPITOR) tablet 10 mg (10 mg Oral Given 07/12/19 1748)  hydrOXYzine (ATARAX/VISTARIL) tablet 10 mg (has no administration in time range)  methylPREDNISolone (MEDROL) tablet 4 mg (has no administration in time range)  dicyclomine (BENTYL) tablet 20 mg (20 mg Oral Given 07/12/19 2147)  sucralfate (CARAFATE) 1 GM/10ML suspension 1 g (1 g Oral Given 07/12/19 2148)  magnesium oxide (MAG-OX) tablet 400 mg (has no administration in time range)  albuterol (PROVENTIL) (2.5 MG/3ML) 0.083% nebulizer solution 3 mL (has no administration in time range)  enoxaparin (LOVENOX) injection 40 mg (40 mg Subcutaneous Given 07/12/19 1254)  acetaminophen (TYLENOL) tablet 650 mg (has no administration in time range)    Or  acetaminophen (TYLENOL) suppository 650 mg (has no administration in time range)  docusate sodium (COLACE) capsule 100 mg (100 mg Oral Given 07/12/19 2147)  ondansetron (ZOFRAN) tablet 4 mg (has no administration in time range)    Or  ondansetron (ZOFRAN) injection 4 mg (has no administration in time range)  sodium chloride flush (NS) 0.9 % injection  3 mL (3 mLs Intravenous Given 07/12/19 2148)  0.9 % NaCl with KCl 20 mEq/ L  infusion ( Intravenous New Bag/Given 07/12/19 1019)  polyethylene glycol (MIRALAX / GLYCOLAX) packet 17 g (has no administration in time range)  bisacodyl (DULCOLAX) EC tablet 5 mg (has no administration in time range)  sodium phosphate (FLEET) 7-19 GM/118ML enema 1 enema (has no administration in time range)  pantoprazole (PROTONIX) injection 40 mg (40 mg Intravenous Given 07/12/19 2147)  insulin aspart (novoLOG) injection 0-15 Units (0 Units Subcutaneous Not Given 07/12/19 1745)   insulin aspart (novoLOG) injection 0-5 Units (0 Units Subcutaneous Not Given 07/12/19 2148)  metoCLOPramide (REGLAN) injection 10 mg (10 mg Intravenous Given 07/12/19 2147)  sodium chloride 0.9 % bolus 1,000 mL (0 mLs Intravenous Stopped 07/12/19 0733)  ondansetron (ZOFRAN) injection 4 mg (4 mg Intravenous Given 07/12/19 0524)  morphine 2 MG/ML injection 2 mg (2 mg Intravenous Given 07/12/19 0527)  potassium chloride SA (KLOR-CON) CR tablet 40 mEq (40 mEq Oral Given 07/12/19 0654)  sodium chloride 0.9 % bolus 1,000 mL (0 mLs Intravenous Stopped 07/12/19 1006)  acetaminophen (TYLENOL) tablet 1,000 mg (1,000 mg Oral Given 07/12/19 0706)  ketorolac (TORADOL) 15 MG/ML injection 15 mg (15 mg Intravenous Given 07/12/19 [id number])    ED Course  I have reviewed the triage vital signs and the nursing notes.  Pertinent labs & imaging results that were available during my care of the patient were reviewed by me and considered in my medical decision making (see chart for details).    MDM Rules/Calculators/A&P     CHA2DS2/VAS Stroke Risk Points      N/A >= 2 Points: High Risk  1 - 1.99 Points: Medium Risk  0 Points: Low Risk    A final score could not be computed because of missing components.: Last  Change: N/A     This score determines the patient's risk of having a stroke if the  patient has atrial fibrillation.      This score is not applicable to this patient. Components are not  calculated.                   83 yo F with a chief complaint of decreased oral intake.  Going on for about two months now.  Patient feels that her symptoms are worsening.  She is having trouble tolerating anything by mouth.  Was recently in the hospital and had a endoscopy that did not show a specific etiology for this.  Clinically appears very dehydrated.  Heart rate into the 140s on arrival.  Give IV fluids nausea medicine.  She is borderline febrile here and temp of 99.6.  Feels very warm clinically.  Will evaluate  for infection.  Chest x-ray viewed by me without focal infiltrate.  Lab work with an elevated lactate, hypokalemia.  Renal function is mildly worse than her baseline.  She appears very dehydrated but has had significant improvements with a liter of IV fluids.  Heart rate in the 140s now down to the low 100s.  She was feeling better and are going to attempt an oral trial and the patient started to have rigors.  Repeat temperature was 100.1.   I am unsure why the patient has had fevers off and on since October.  At this point so will discuss with medicine for possible admission.  The patients results and plan were reviewed and discussed.   Any x-rays performed were independently reviewed by myself.   Differential diagnosis were  considered with the presenting HPI.  Medications  aspirin EC tablet 81 mg (has no administration in time range)  hydroxychloroquine (PLAQUENIL) tablet 200 mg (200 mg Oral Given 07/12/19 2147)  atorvastatin (LIPITOR) tablet 10 mg (10 mg Oral Given 07/12/19 1748)  hydrOXYzine (ATARAX/VISTARIL) tablet 10 mg (has no administration in time range)  methylPREDNISolone (MEDROL) tablet 4 mg (has no administration in time range)  dicyclomine (BENTYL) tablet 20 mg (20 mg Oral Given 07/12/19 2147)  sucralfate (CARAFATE) 1 GM/10ML suspension 1 g (1 g Oral Given 07/12/19 2148)  magnesium oxide (MAG-OX) tablet 400 mg (has no administration in time range)  albuterol (PROVENTIL) (2.5 MG/3ML) 0.083% nebulizer solution 3 mL (has no administration in time range)  enoxaparin (LOVENOX) injection 40 mg (40 mg Subcutaneous Given 07/12/19 1254)  acetaminophen (TYLENOL) tablet 650 mg (has no administration in time range)    Or  acetaminophen (TYLENOL) suppository 650 mg (has no administration in time range)  docusate sodium (COLACE) capsule 100 mg (100 mg Oral Given 07/12/19 2147)  ondansetron (ZOFRAN) tablet 4 mg (has no administration in time range)    Or  ondansetron (ZOFRAN) injection 4  mg (has no administration in time range)  sodium chloride flush (NS) 0.9 % injection 3 mL (3 mLs Intravenous Given 07/12/19 2148)  0.9 % NaCl with KCl 20 mEq/ L  infusion ( Intravenous New Bag/Given 07/12/19 1019)  polyethylene glycol (MIRALAX / GLYCOLAX) packet 17 g (has no administration in time range)  bisacodyl (DULCOLAX) EC tablet 5 mg (has no administration in time range)  sodium phosphate (FLEET) 7-19 GM/118ML enema 1 enema (has no administration in time range)  pantoprazole (PROTONIX) injection 40 mg (40 mg Intravenous Given 07/12/19 2147)  insulin aspart (novoLOG) injection 0-15 Units (0 Units Subcutaneous Not Given 07/12/19 1745)  insulin aspart (novoLOG) injection 0-5 Units (0 Units Subcutaneous Not Given 07/12/19 2148)  metoCLOPramide (REGLAN) injection 10 mg (10 mg Intravenous Given 07/12/19 2147)  sodium chloride 0.9 % bolus 1,000 mL (0 mLs Intravenous Stopped 07/12/19 0733)  ondansetron (ZOFRAN) injection 4 mg (4 mg Intravenous Given 07/12/19 0524)  morphine 2 MG/ML injection 2 mg (2 mg Intravenous Given 07/12/19 0527)  potassium chloride SA (KLOR-CON) CR tablet 40 mEq (40 mEq Oral Given 07/12/19 0654)  sodium chloride 0.9 % bolus 1,000 mL (0 mLs Intravenous Stopped 07/12/19 1006)  acetaminophen (TYLENOL) tablet 1,000 mg (1,000 mg Oral Given 07/12/19 0706)  ketorolac (TORADOL) 15 MG/ML injection 15 mg (15 mg Intravenous Given 07/12/19 0726)    Vitals:   07/12/19 1236 07/12/19 1754 07/12/19 2118 07/12/19 2143  BP: 116/62 109/70 127/90   Pulse: 97 (!) 109 (!) 125 (!) 101  Resp: 18 18 16    Temp: 98.6 F (37 C) 98.3 F (36.8 C) 98.1 F (36.7 C)   TempSrc:  Oral Oral   SpO2: 95% 99% 98%   Weight:        Final diagnoses:  Fever of unknown origin    Admission/ observation were discussed with the admitting physician, patient and/or family and they are comfortable with the plan.   Final Clinical Impression(s) / ED Diagnoses Final diagnoses:  Fever of unknown origin     Rx / DC Orders ED Discharge Orders    None       Deno Etienne, DO 07/12/19 2302

## 2019-07-13 DIAGNOSIS — Z7952 Long term (current) use of systemic steroids: Secondary | ICD-10-CM

## 2019-07-13 DIAGNOSIS — J42 Unspecified chronic bronchitis: Secondary | ICD-10-CM

## 2019-07-13 DIAGNOSIS — N182 Chronic kidney disease, stage 2 (mild): Secondary | ICD-10-CM

## 2019-07-13 DIAGNOSIS — E860 Dehydration: Secondary | ICD-10-CM

## 2019-07-13 DIAGNOSIS — E1122 Type 2 diabetes mellitus with diabetic chronic kidney disease: Secondary | ICD-10-CM

## 2019-07-13 DIAGNOSIS — I5032 Chronic diastolic (congestive) heart failure: Secondary | ICD-10-CM

## 2019-07-13 LAB — GLUCOSE, CAPILLARY
Glucose-Capillary: 88 mg/dL (ref 70–99)
Glucose-Capillary: 96 mg/dL (ref 70–99)
Glucose-Capillary: 124 mg/dL — ABNORMAL HIGH (ref 70–99)
Glucose-Capillary: 103 mg/dL — ABNORMAL HIGH (ref 70–99)

## 2019-07-13 LAB — CBC
HCT: 30 % — ABNORMAL LOW (ref 36.0–46.0)
Hemoglobin: 9.6 g/dL — ABNORMAL LOW (ref 12.0–15.0)
MCH: 29.1 pg (ref 26.0–34.0)
MCHC: 32 g/dL (ref 30.0–36.0)
MCV: 90.9 fL (ref 80.0–100.0)
Platelets: 243 10*3/uL (ref 150–400)
RBC: 3.3 MIL/uL — ABNORMAL LOW (ref 3.87–5.11)
RDW: 14.6 % (ref 11.5–15.5)
WBC: 5.8 10*3/uL (ref 4.0–10.5)
nRBC: 0 % (ref 0.0–0.2)

## 2019-07-13 LAB — BASIC METABOLIC PANEL
Anion gap: 6 (ref 5–15)
BUN: 7 mg/dL — ABNORMAL LOW (ref 8–23)
CO2: 24 mmol/L (ref 22–32)
Calcium: 7.8 mg/dL — ABNORMAL LOW (ref 8.9–10.3)
Chloride: 114 mmol/L — ABNORMAL HIGH (ref 98–111)
Creatinine, Ser: 0.9 mg/dL (ref 0.44–1.00)
GFR calc Af Amer: >60 mL/min (ref >60)
GFR calc non Af Amer: 60 mL/min — ABNORMAL LOW (ref >60)
Glucose, Bld: 89 mg/dL (ref 70–99)
Potassium: 3.3 mmol/L — ABNORMAL LOW (ref 3.5–5.1)
Sodium: 144 mmol/L (ref 135–145)

## 2019-07-13 LAB — MAGNESIUM: Magnesium: 1.1 mg/dL — ABNORMAL LOW (ref 1.7–2.4)

## 2019-07-13 MED ORDER — POTASSIUM CHLORIDE 10 MEQ/100ML IV SOLN
10.0000 meq | INTRAVENOUS | Status: AC
  Administered 2019-07-13 (×3): 10 meq via INTRAVENOUS
  Filled 2019-07-13 (×3): qty 100

## 2019-07-13 MED ORDER — MAGNESIUM SULFATE 4 GM/100ML IV SOLN
4.0000 g | Freq: Once | INTRAVENOUS | Status: AC
  Administered 2019-07-13: 4 g via INTRAVENOUS
  Filled 2019-07-13: qty 100

## 2019-07-13 MED ORDER — CALCIUM GLUCONATE-NACL 2-0.675 GM/100ML-% IV SOLN
2.0000 g | Freq: Once | INTRAVENOUS | Status: AC
  Administered 2019-07-13: 2000 mg via INTRAVENOUS
  Filled 2019-07-13: qty 100

## 2019-07-13 MED ORDER — SODIUM CHLORIDE 0.9 % IV SOLN
1.0000 g | INTRAVENOUS | Status: DC
  Administered 2019-07-13 – 2019-07-14 (×2): 1 g via INTRAVENOUS
  Filled 2019-07-13 (×2): qty 1

## 2019-07-13 NOTE — TOC Initial Note (Addendum)
Transition of Care [facility]) - Initial/Assessment Note    Patient Details  Name: [name] MRN: [id number] Date of Birth: Sep 25, 1935  Transition of Care [facility]) CM/SW Contact:    Bartholomew Crews, RN Phone Number: [phone] 07/13/2019, 1:56 PM  Clinical Narrative:        Update: [facility] accepted referral for PT, OT. Patient will need HH orders for PT, OT with Face to Face.             Received message from PT with recommendations for [facility] PT and a caregiver. Spoke with patient at the bedside.   PTA home with a nephew who is there at night stating that he works full-time [employer]. Niece assists with picking up prescriptions and coordination of medical appointments.   Has a walker and a bedside commode. States that she gets $70/month that she can order supplies/equipment from a catalog.   Discussed recommendations from PT - patient agreeable. Advised that NCM will complete PCS form and fax to [employer]. Referral declined by [facility], [facility], [facility], [place], [facility], and [facility]. Patient is out of network with [facility].   Left voicemail for patient's nephew, [place] [employer], at [phone] with NCM contact information - patient provided permission to leave voicemail for Gwenlyn Perking. Spoke with patient's niece, Gaylan Gerold, [phone]. Bethena Roys had visited the patient this afternoon. She was in agreement with PCS. Advised that someone would call from the home care agency to schedule home care visits.   Patient and Bethena Roys both advised that a home health agency has not been found at this time for PT.   Niece to provide transportation home.   TOC team following for transition home.   Expected Discharge Plan: [place] Barriers to Discharge: Continued Medical Work up   Patient Goals and CMS Choice Patient states their goals for this hospitalization and ongoing recovery are:: home CMS Medicare.gov Compare Post Acute Care list provided  to:: Patient Choice offered to / list presented to : Patient  Expected Discharge Plan and Services Expected Discharge Plan: [place] In-house Referral: Clinical Social Work Discharge Planning Services: CM Consult Post Acute Care Choice: [place] arrangements for the past 2 months: Apartment                 DME Arranged: N/A DME Agency: NA       HH Arranged: PT          Prior Living Arrangements/Services Living arrangements for the past 2 months: Apartment Lives with:: Self, Relatives Patient language and need for interpreter reviewed:: Yes Do you feel safe going back to the place where you live?: Yes          Current home services: DME Criminal Activity/Legal Involvement Pertinent to Current Situation/Hospitalization: No - Comment as needed  Activities of Daily Living      Permission Sought/Granted Permission sought to share information with : Family Supports Permission granted to share information with : Yes, Release of Information Signed  Share Information with NAME: Gaylan Gerold     Permission granted to share info w Relationship: niece     Emotional Assessment Appearance:: Appears stated age Attitude/Demeanor/Rapport: Engaged Affect (typically observed): Accepting Orientation: : Oriented to Self, Oriented to  Time, Oriented to Place, Oriented to Situation Alcohol / Substance Use: Not Applicable Psych Involvement: No (comment)  Admission diagnosis:  Unexplained weight loss [R63.4] Patient Active Problem List   Diagnosis Date Noted  . Unexplained  weight loss 07/12/2019  . Non-intractable vomiting   . Nausea, vomiting, and diarrhea 05/30/2019  . Hypokalemia 05/30/2019  . Hypomagnesemia 05/30/2019  . Gastritis 05/30/2019  . Elevated lipase 05/30/2019  . SIRS (systemic inflammatory response syndrome) ([place]) 05/30/2019  . History of COPD   . Other chronic pain 04/22/2019  . Dyspnea 08/10/2018  . Acute diastolic heart failure  ([place]) 06/15/2018  . Essential hypertension 06/15/2018  . 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 Gastric mass   . Sepsis secondary to UTI ([place])   . Uncontrolled type 2 diabetes mellitus with complication ([place])   . Acute renal failure ([place])   . Chronic diastolic CHF (congestive heart failure) ([place])   . E-coli UTI 01/09/2017  . Sepsis ([place]) 01/09/2017  . Constipation 01/09/2017  . COPD (chronic obstructive pulmonary disease) ([place]) 01/09/2017  . Multiple falls 06/14/2015  . Weight loss 06/14/2015  . Acute respiratory failure with hypoxia ([place]) 06/14/2015  . Orthostasis 06/14/2015  . Acute respiratory failure ([place]) 06/14/2015  . Elevated lactic acid level 01/22/2015  . 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 Chronic diastolic heart failure, NYHA class 1 ([place]) 01/22/2015  . Tachycardia with heart rate 100-120 beats per minute 04/08/2014  . DM (diabetes mellitus) ([place]) 04/08/2014  . Chest pain 04/07/2014  . Hyperglycemia 12/11/2012  . Syncope and collapse 12/11/2012  . Dehydration, mild 12/11/2012  . Noncompliance 12/11/2012  . UTI (lower urinary tract infection) 12/11/2012  . 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 Rheumatoid arthritis ([place]) 12/11/2012   PCP:  [name], PA-C Pharmacy:   [facility] [place], [place] - [address] [place] [address] Phone: [phone] Fax: [phone]     Social Determinants of Health (SDOH) Interventions    Readmission Risk Interventions No flowsheet data found.

## 2019-07-13 NOTE — Evaluation (Signed)
Physical Therapy Evaluation Patient Details Name: [name] MRN: [id number] DOB: Aug 10, 1935 Today's Date: 07/13/2019   History of Present Illness  83 y.o. female was admitted with significant wgt loss in last few months, weakness and dizziness with orthostasis suspected.  Has been having chest pain, had syncopal episode at home just prior to admission.  Mult falls recently per pt.  Having L knee pain, OA noted on imaging.  PMHx:  CHF, HTN, COPD, DM, RA, immunosuppressive therapy, abd pain with N&V, chronic steroid use, transfusions, migraines, OSA, angina, gastric mass, UTI's, falls, sepsis,   Clinical Impression  Pt was seen for mobility of standing, gait and balance skills, as well as strength testing.  Due to dizziness, ck of BP, pulses and sats were done and shared to hospitalist.  BP sitting was 135/83, pulse 109 and sats 98%;  Standing with dizziness was 129/60, pulse 117 and sats 100%.  Pt will continue acutely and if help cannot be arranged for AM hours will need to consider a rehab setting before home.    Follow Up Recommendations Home health PT;Supervision for mobility/OOB;Supervision/Assistance - 24 hour    Equipment Recommendations  None recommended by PT    Recommendations for Other Services       Precautions / Restrictions Precautions Precautions: Fall Precaution Comments: does not consistently use an AD at home Restrictions Weight Bearing Restrictions: No      Mobility  Bed Mobility Overal bed mobility: Needs Assistance Bed Mobility: Supine to Sit;Sit to Supine     Supine to sit: Min assist Sit to supine: Min guard   General bed mobility comments: used bedrail and received cues from PT for sequence and safety to get OOB  Transfers Overall transfer level: Needs assistance Equipment used: 1 person hand held assist;Rolling walker (2 wheeled) Transfers: Sit to/from Stand Sit to Stand: Min assist         General transfer comment: pt initially did not see  a reason to use walker but talked with her about pain management L knee  Ambulation/Gait Ambulation/Gait assistance: Min assist;Min guard Gait Distance (Feet): 100 Feet Assistive device: Rolling walker (2 wheeled);Pushed wheelchair;2 person hand held assist Gait Pattern/deviations: Step-through pattern;Decreased stride length;Wide base of support;Trunk flexed(dizziness with standing) Gait velocity: reduced Gait velocity interpretation: <1.31 ft/sec, indicative of household ambulator General Gait Details: pt had BP and sat ck's sitting and standing with no findings x pulses up to 117 standing, shared with hospitalist  Stairs            Wheelchair Mobility    Modified Rankin (Stroke Patients Only)       Balance Overall balance assessment: History of Falls;Needs assistance Sitting-balance support: Feet supported Sitting balance-Leahy Scale: Good     Standing balance support: Bilateral upper extremity supported;During functional activity Standing balance-Leahy Scale: Fair Standing balance comment: less than fair dynamic balance                             Pertinent Vitals/Pain Pain Assessment: 0-10 Pain Score: 8  Pain Location: L knee with mobility Pain Descriptors / Indicators: Grimacing;Guarding Pain Intervention(s): Limited activity within patient's tolerance;Monitored during session;Premedicated before session;Repositioned    Home Living Family/patient expects to be discharged to:: Private residence Living Arrangements: Other (Comment)(Nephew) Available Help at Discharge: Family;Available PRN/intermittently(alone for AM until 1) Type of Home: Apartment Home Access: Stairs to enter Entrance Stairs-Rails: Right Entrance Stairs-Number of Steps: 3 Home Layout: One level Home Equipment: Tub bench;Bedside  commode;Walker - 2 wheels      Prior Function Level of Independence: Independent;Independent with assistive device(s)         Comments: does her  own cooking, but neice and nephew get groceries, manage medications and help with the trash. Niece helps with tub shower transfer     Hand Dominance   Dominant Hand: Right    Extremity/Trunk Assessment   Upper Extremity Assessment Upper Extremity Assessment: Defer to OT evaluation    Lower Extremity Assessment Lower Extremity Assessment: Generalized weakness(RLE 4 to 4+, LLE 4-)    Cervical / Trunk Assessment Cervical / Trunk Assessment: Normal  Communication   Communication: No difficulties  Cognition Arousal/Alertness: Awake/alert Behavior During Therapy: WFL for tasks assessed/performed Overall Cognitive Status: Within Functional Limits for tasks assessed Area of Impairment: Awareness;Safety/judgement                         Safety/Judgement: Decreased awareness of deficits Awareness: Intellectual   General Comments: pt has realization that she needs help but is not clear on how to apply it      General Comments General comments (skin integrity, edema, etc.): pt has both a reduction of strength on LLE and knee pain from diagnosed OA.  Has used RW today to manage pain and reduce fall risk    Exercises     Assessment/Plan    PT Assessment Patient needs continued PT services  PT Problem List Decreased strength;Decreased range of motion;Decreased activity tolerance;Decreased balance;Decreased mobility;Decreased coordination;Decreased knowledge of use of DME;Decreased safety awareness;Cardiopulmonary status limiting activity       PT Treatment Interventions DME instruction;Gait training;Stair training;Functional mobility training;Therapeutic activities;Therapeutic exercise;Balance training;Neuromuscular re-education;Patient/family education    PT Goals (Current goals can be found in the Care Plan section)  Acute Rehab PT Goals Patient Stated Goal: get home and have AM help PT Goal Formulation: With patient Time For Goal Achievement: 07/27/19 Potential to  Achieve Goals: Good    Frequency Min 3X/week   Barriers to discharge Inaccessible home environment;Decreased caregiver support has a block of hours home alone and is in staired entrance    Co-evaluation PT/OT/SLP Co-Evaluation/Treatment: Yes Reason for Co-Treatment: For patient/therapist safety;To address functional/ADL transfers PT goals addressed during session: Mobility/safety with mobility;Balance;Proper use of DME         AM-PAC PT "6 Clicks" Mobility  Outcome Measure Help needed turning from your back to your side while in a flat bed without using bedrails?: A Little Help needed moving from lying on your back to sitting on the side of a flat bed without using bedrails?: A Little Help needed moving to and from a bed to a chair (including a wheelchair)?: A Little Help needed standing up from a chair using your arms (e.g., wheelchair or bedside chair)?: A Little Help needed to walk in hospital room?: A Little Help needed climbing 3-5 steps with a railing? : A Lot 6 Click Score: 17    End of Session Equipment Utilized During Treatment: Gait belt Activity Tolerance: Patient limited by fatigue Patient left: in bed;with call bell/phone within reach;with bed alarm set Nurse Communication: Mobility status PT Visit Diagnosis: Unsteadiness on feet (R26.81);Muscle weakness (generalized) (M62.81);History of falling (Z91.81)    Time: [id number] PT Time Calculation (min) (ACUTE ONLY): 40 min   Charges:   PT Evaluation $PT Eval Moderate Complexity: 1 Mod PT Treatments $Gait Training: 8-22 mins       Ramond Dial 07/13/2019, 12:51 PM   Mee Hives,  PT MS Acute Rehab Dept. Number: [place]

## 2019-07-13 NOTE — Evaluation (Signed)
Occupational Therapy Evaluation Patient Details Name: [name] MRN: [id number] DOB: May 24, 1936 Today's Date: 07/13/2019    History of Present Illness 83 y.o. female was admitted with significant wgt loss in last few months, weakness and dizziness with orthostasis suspected.  Has been having chest pain, had syncopal episode at home just prior to admission.  Mult falls recently per pt.  Having L knee pain, OA noted on imaging.  PMHx:  CHF, HTN, COPD, DM, RA, immunosuppressive therapy, abd pain with N&V, chronic steroid use, transfusions, migraines, OSA, angina, gastric mass, UTI's, falls, sepsis,    Clinical Impression   Pt admitted with the above diagnoses and presents with below problem list. Pt will benefit from continued acute OT to address the below listed deficits and maximize independence with basic ADLs prior to d/c home. PTA pt mod I with basic ADLs, assist for tub transfers. Pt currently min A with LB ADLs and functional transfers/mobility. Limited by onset of dizziness in standing/walking and fatigue. BP assessed in sitting and standing and all vital signs stable, no orthostatic drop.      Follow Up Recommendations  Home health OT;Supervision/Assistance - 24 hour;Other (comment)(HH aide for mornings)    Equipment Recommendations  None recommended by OT    Recommendations for Other Services       Precautions / Restrictions Precautions Precautions: Fall Precaution Comments: does not consistently use an AD at home Restrictions Weight Bearing Restrictions: No      Mobility Bed Mobility Overal bed mobility: Needs Assistance Bed Mobility: Supine to Sit;Sit to Supine     Supine to sit: Min assist Sit to supine: Min guard   General bed mobility comments: used bedrail and received cues from PT for sequence and safety to get OOB  Transfers Overall transfer level: Needs assistance Equipment used: 1 person hand held assist;Rolling walker (2 wheeled) Transfers: Sit  to/from Stand Sit to Stand: Min assist         General transfer comment: pt initially did not see a reason to use walker but talked with her about pain management L knee    Balance Overall balance assessment: History of Falls;Needs assistance Sitting-balance support: Feet supported Sitting balance-Leahy Scale: Good     Standing balance support: Bilateral upper extremity supported;During functional activity Standing balance-Leahy Scale: Fair Standing balance comment: less than fair dynamic balance                           ADL either performed or assessed with clinical judgement   ADL Overall ADL's : Needs assistance/impaired Eating/Feeding: Set up;Sitting   Grooming: Set up;Sitting   Upper Body Bathing: Set up;Sitting   Lower Body Bathing: Minimal assistance;Sit to/from stand   Upper Body Dressing : Set up;Sitting   Lower Body Dressing: Minimal assistance;Sit to/from stand   Toilet Transfer: Minimal assistance;Ambulation;RW   Toileting- Clothing Manipulation and Hygiene: Minimal assistance;Sit to/from stand   Tub/ Shower Transfer: Minimal assistance   Functional mobility during ADLs: Min guard;Minimal assistance;Rolling walker General ADL Comments: Pt completed short walk in the hallway. Limited by dizziness and fatigue. Recliner ride back to room. Min A for LB ADLs for balance.      Vision         Perception     Praxis      Pertinent Vitals/Pain Pain Assessment: 0-10 Pain Score: 8  Pain Location: L knee with mobility Pain Descriptors / Indicators: Grimacing;Guarding Pain Intervention(s): Limited activity within patient's tolerance;Monitored during session;Premedicated before  session;Repositioned     Hand Dominance Right   Extremity/Trunk Assessment Upper Extremity Assessment Upper Extremity Assessment: Generalized weakness   Lower Extremity Assessment Lower Extremity Assessment: Defer to PT evaluation   Cervical / Trunk  Assessment Cervical / Trunk Assessment: Normal   Communication Communication Communication: No difficulties   Cognition Arousal/Alertness: Awake/alert Behavior During Therapy: WFL for tasks assessed/performed Overall Cognitive Status: Within Functional Limits for tasks assessed Area of Impairment: Awareness;Safety/judgement                         Safety/Judgement: Decreased awareness of deficits Awareness: Intellectual   General Comments: pt has realization that she needs help but is not clear on how to apply it   General Comments  pt has both a reduction of strength on LLE and knee pain from diagnosed OA.  Has used RW today to manage pain and reduce fall risk    Exercises Exercises: Other exercises(4 to 4+ RLE and 4- LLE)   Shoulder Instructions      Home Living Family/patient expects to be discharged to:: Private residence Living Arrangements: Other (Comment)(Nephew) Available Help at Discharge: Family;Available PRN/intermittently(alone for AM until 1) Type of Home: Apartment Home Access: Stairs to enter [employer] of Steps: 3 Entrance Stairs-Rails: Right Home Layout: One level     Bathroom Shower/Tub: Corporate investment banker: Standard     Home Equipment: Tub bench;Bedside commode;Walker - 2 wheels   Additional Comments: neice and nephew assist wtih IADL's      Prior Functioning/Environment Level of Independence: Independent;Independent with assistive device(s)        Comments: does her own cooking, but neice and nephew get groceries, manage medications and help with the trash. Niece helps with tub shower transfer        OT Problem List: Decreased strength;Decreased range of motion;Decreased activity tolerance;Impaired balance (sitting and/or standing);Decreased knowledge of use of DME or AE;Pain      OT Treatment/Interventions: Self-care/ADL training;Therapeutic activities;DME and/or AE instruction;Balance training     OT Goals(Current goals can be found in the care plan section) Acute Rehab OT Goals Patient Stated Goal: get home and have AM help OT Goal Formulation: With patient Time For Goal Achievement: 07/27/19 Potential to Achieve Goals: Good ADL Goals Pt Will Perform Grooming: with supervision;standing Pt Will Perform Lower Body Bathing: with supervision;sit to/from stand Pt Will Perform Lower Body Dressing: with supervision;sit to/from stand Pt Will Transfer to Toilet: with supervision;ambulating Pt Will Perform Toileting - Clothing Manipulation and hygiene: with supervision;sit to/from stand  OT Frequency: Min 2X/week   Barriers to D/C: Decreased caregiver support  Pt lives alone and will not have 24/7 supervision. She has alot of family available to check in on her.       Co-evaluation PT/OT/SLP Co-Evaluation/Treatment: Yes Reason for Co-Treatment: For patient/therapist safety;To address functional/ADL transfers PT goals addressed during session: Mobility/safety with mobility;Balance;Proper use of DME OT goals addressed during session: ADL's and self-care      AM-PAC OT "6 Clicks" Daily Activity     Outcome Measure Help from another person eating meals?: None Help from another person taking care of personal grooming?: None Help from another person toileting, which includes using toliet, bedpan, or urinal?: A Little Help from another person bathing (including washing, rinsing, drying)?: A Little Help from another person to put on and taking off regular upper body clothing?: None Help from another person to put on and taking off regular lower body clothing?: A  Little 6 Click Score: 21   End of Session Equipment Utilized During Treatment: Gait belt;Rolling walker  Activity Tolerance: Patient limited by fatigue;Other (comment)(dizziness standing/walking) Patient left: with call bell/phone within reach;in bed;with bed alarm set  OT Visit Diagnosis: Unsteadiness on feet (R26.81);Pain                 Time: 0940-1003 OT Time Calculation (min): 23 min Charges:  OT General Charges $OT Visit: 1 Visit OT Evaluation $OT Eval Moderate Complexity: [place], OT Acute Rehabilitation Services Pager: [phone] Office: [phone]   Hortencia Pilar 07/13/2019, 1:37 PM

## 2019-07-13 NOTE — Progress Notes (Signed)
Progress Note   Subjective  Chief Complaint: Nausea, vomiting and epigastric pain  Today, patient tells me she is completely better from a GI standpoint.  Explains that she woke up in the middle of the night and actually had an appetite.  No further nausea or vomiting started since starting the Reglan.  Has not actually eaten anything yet as her clear liquid diet was cold when she went to try.  Does describe that she feels like she is not completely emptying her bladder and feels a "fullness down there", she discussed this with the hospitalist and feels like she may have a UTI.    Objective   Vital signs in last 24 hours: Temp:  [98 F (36.7 C)-98.6 F (37 C)] 98.5 F (36.9 C) (12/15 0848) Pulse Rate:  [96-125] 111 (12/15 0855) Resp:  [16-21] 18 (12/15 0848) BP: (105-153)/(52-90) 142/68 (12/15 0855) SpO2:  [95 %-99 %] 96 % (12/15 0855)   General:    AA female in NAD Heart:  Regular rate and rhythm; no murmurs Lungs: Respirations even and unlabored, lungs CTA bilaterally Abdomen:  Soft, nontender and nondistended. Normal bowel sounds. Extremities:  Without edema. Neurologic:  Alert and oriented,  grossly normal neurologically. Psych:  Cooperative. Normal mood and affect.  Intake/Output from previous day: 12/14 0701 - 12/15 0700 In: 1597.5 [P.O.:120; I.V.:1477.5] Out: -  Intake/Output this shift: Total I/O In: 100 [P.O.:100] Out: 100 [Urine:100]  Lab Results: Recent Labs    07/12/19 0408 07/13/19 0603  WBC 9.9 5.8  HGB 13.3 9.6*  HCT 41.7 30.0*  PLT 351 243   BMET Recent Labs    07/12/19 0408 07/13/19 0603  NA 139 144  K 3.0* 3.3*  CL 99 114*  CO2 24 24  GLUCOSE 179* 89  BUN 13 7*  CREATININE 1.16* 0.90  CALCIUM 9.6 7.8*   LFT Recent Labs    07/12/19 0418  PROT 6.7  ALBUMIN 3.3*  AST 34  ALT 16  ALKPHOS 42  BILITOT 0.7  BILIDIR 0.2  IBILI 0.5   Studies/Results: DG Chest Port 1 View  Result Date: 07/12/2019 CLINICAL DATA:  Knee pain  after fall. Dizziness for weeks. Dizziness when standing. EXAM: PORTABLE CHEST 1 VIEW COMPARISON:  05/30/2019 FINDINGS: Chronic elevation of right hemidiaphragm. Lungs are clear. No focal airspace disease. No pulmonary edema. No pneumothorax. Left upper quadrant calcification is chronic in represents an exophytic gastric mass is seen on prior cross-sectional imaging. No acute osseous abnormalities are seen. Minimal branching hyperdensity consistent with methylmethacrylate within the right lung from prior vertebral augmentation, unchanged. IMPRESSION: 1. No acute abnormality. 2. Chronic elevation of the right hemidiaphragm. Electronically Signed   By: Keith Rake M.D.   On: 07/12/2019 05:16   DG Knee Complete 4 Views Left  Result Date: 07/12/2019 CLINICAL DATA:  Left knee pain after fall. EXAM: LEFT KNEE - COMPLETE 4+ VIEW COMPARISON:  None. FINDINGS: No evidence of fracture, dislocation, or joint effusion. Medial tibiofemoral joint space narrowing with peripheral spurring. There is a prominent quadriceps tendon enthesophyte. Incidental note of vascular calcifications. IMPRESSION: 1. No fracture or subluxation of the left knee. 2. Osteoarthritis of the medial tibiofemoral compartment. Electronically Signed   By: Keith Rake M.D.   On: 07/12/2019 05:14    Assessment / Plan:   Assessment: 1.  Nausea and vomiting: Thought related to gastroparesis, resolved with twice daily PPI, Carafate and Reglan 2.  Epigastric pain 3.  Weight loss  Plan: 1.  Continue IV Reglan  for another 24 hours, then switch to p.o. and discharge with this medication 5mg  TID before meals 2.  Continue other GI meds 3.  We will start patient on a clear liquid diet, can advance as tolerated to low fat/low fiber 4.  We will sign off and make arrangements for patient to follow-up with Dr. Havery Moros in 3 to 4 weeks in our outpatient clinic.  Please await any final recommendations from Dr. Bryan Lemma later today.  Please call if  we see if we can be of any further assistance.  Thank you for kind consultation.    LOS: 1 day   Levin Erp  07/13/2019, 9:57 AM

## 2019-07-13 NOTE — Progress Notes (Addendum)
[facility] PROGRESS NOTE  [name] [id number] DOB: Jun 04, 1936 DOA: 07/12/2019 PCP: [name], PA-C  Brief Summary: HPI: [name] is a 83 y.o. female with medical history significant of DM; OSA on BIPAP; HTN; HLD; COPD; RA on chronic immunosuppression; and chronic diastolic CHF presenting with syncope.  The first time she came to the hospital, she had a bad stomach virus.  She had EGD and she was given prescriptions and discharged.  She wasn't any better and continuing to have daily vomiting and decreased PO intake (this is persisting).  She returned to the hospital with the same symptoms.  They kept her a couple of days and sent her home.  Since then, she can't eat, losing weight, can't even walk from her bedroom to the bathroom - she is too dizzy, shaky, can't make it.  She has lost maybe 45 pounds in the last maybe 2 months.  +headaches, + vision changes (?related to orthostasis, only when she stands up and gets dizzy).  +chest pain about every other day (epigastric pain).  The Carafate is not helping.  Constantly nauseated, occasionally vomiting, frequent dry heaves.  +chronic constipation.    No urinary symptoms.  No skin rash.  +night sweats.  She fell last night, awoke with posterior thigh pain.  She did pass out when she fell.   Her last hospitalization was from 10/31-11/2 and before that 10/21-28.   She had a GI consult with EGD that showed chronic gastritis; negative urine culture, treated with 3 days of Cefepime; and AKI that resolved with IVF (creatinine 4.91 -> 0.8).  ED Course:  Frequent n/v, poor PO intake, frequent visits.  Tachycardia with some improvement with IVF.  Lactate is 3, rectal temp 100.1, rigors.    Assessment/Plan: Principal Problem:   Unexplained weight loss Active Problems:   Dehydration, mild   Immunosuppression due to chronic steroid use   Rheumatoid arthritis (HCC)   DM (diabetes mellitus) (HCC)   Elevated lactic acid  level   COPD (chronic obstructive pulmonary disease) (HCC)   Chronic diastolic CHF (congestive heart failure) (HCC)   Essential hypertension   Non-intractable vomiting   Failure to thrive  Patient has longstanding history of gastroparesis  We will continue IV fluids and encouraging diet.  Intractable vomiting  GI consulted  Continue Reglan  Dehydration  Improving  Hypokalemia  Replace potassium  Recheck in the morning  Hypomagnesemia  Magnesium today 1.1  Replace magnesium  Recheck in the morning  Hypocalcemia  Calcium 7.8  Replace calcium  UTI  Rocephin  UCx pending  Diabetes  Patient on steroids  Sliding scale insulin  Hypertension  Controlled  Rheumatoid arthritis  On Plaquenil   COPD  Breathing controlled  Chronic diastolic heart failure  Compensated    Code Status: DNR Family Communication: None  Disposition Plan: Pending   Consultants:  GI  Procedures:  None  Antibiotics:  None  HPI/Subjective: Patient doing well.  Tolerated some liquids.  No vomiting since she has been here.  Objective: Vitals:   07/13/19 0852 07/13/19 0855  BP: (!) 142/69 (!) 142/68  Pulse: (!) 109 (!) 111  Resp:    Temp:    SpO2: 96% 96%    Intake/Output Summary (Last 24 hours) at 07/13/2019 1308 Last data filed at 07/13/2019 1037 Gross per 24 hour  Intake 1697.5 ml  Output 300 ml  Net 1397.5 ml   Filed Weights   07/12/19 0407  Weight: 56.8 kg    Exam:   General:  Awake and alert, oriented x3.  No acute distress  Cardiovascular: Regular rate with normal S1-S2 sounds.  No murmurs auscultated.  Respiratory: Clear to auscultation bilaterally  Abdomen: Soft, nontender, nondistended.  Musculoskeletal: All major joints nonerythematous and nontender.  Extremity: No edema.  Warm to touch with 2+ but pulses.  Data Reviewed: Basic Metabolic Panel: Recent Labs  Lab 07/12/19 0408 07/12/19 0418 07/13/19 0603  07/13/19 0940  NA 139  --  144  --   K 3.0*  --  3.3*  --   CL 99  --  114*  --   CO2 24  --  24  --   GLUCOSE 179*  --  89  --   BUN 13  --  7*  --   CREATININE 1.16*  --  0.90  --   CALCIUM 9.6  --  7.8*  --   MG  --  1.5*  --  1.1*   Liver Function Tests: Recent Labs  Lab 07/12/19 0418  AST 34  ALT 16  ALKPHOS 42  BILITOT 0.7  PROT 6.7  ALBUMIN 3.3*   Recent Labs  Lab 07/12/19 0418  LIPASE 86*   No results for input(s): AMMONIA in the last 168 hours. CBC: Recent Labs  Lab 07/12/19 0408 07/13/19 0603  WBC 9.9 5.8  HGB 13.3 9.6*  HCT 41.7 30.0*  MCV 89.1 90.9  PLT 351 243   Cardiac Enzymes: No results for input(s): CKTOTAL, CKMB, CKMBINDEX, TROPONINI in the last 168 hours. BNP (last 3 results) No results for input(s): BNP in the last 8760 hours.  ProBNP (last 3 results) No results for input(s): PROBNP in the last 8760 hours.  CBG: Recent Labs  Lab 07/12/19 1238 07/12/19 1709 07/12/19 2120 07/13/19 0711 07/13/19 1125  GLUCAP 99 93 85 88 96    Recent Results (from the past 240 hour(s))  Blood culture (routine x 2)     Status: None (Preliminary result)   Collection Time: 07/12/19  4:25 AM   Specimen: BLOOD  Result Value Ref Range Status   Specimen Description BLOOD SITE NOT SPECIFIED  Final   Special Requests   Final    BOTTLES DRAWN AEROBIC AND ANAEROBIC Blood Culture adequate volume   Culture   Final    NO GROWTH < 12 HOURS Performed at [facility], [place] [address] [place], [place] [address]    Report Status PENDING  Incomplete  Blood culture (routine x 2)     Status: None (Preliminary result)   Collection Time: 07/12/19  5:35 AM   Specimen: BLOOD  Result Value Ref Range Status   Specimen Description BLOOD RIGHT ANTECUBITAL  Final   Special Requests   Final    BOTTLES DRAWN AEROBIC ONLY Blood Culture results may not be optimal due to an inadequate volume of blood received in culture bottles   Culture   Final    NO GROWTH < 12  HOURS Performed at [facility], [place] [address] [place], [place] [address]    Report Status PENDING  Incomplete  SARS CORONAVIRUS 2 (TAT 6-24 HRS) Nasopharyngeal Nasopharyngeal Swab     Status: None   Collection Time: 07/12/19  7:22 AM   Specimen: Nasopharyngeal Swab  Result Value Ref Range Status   SARS Coronavirus 2 NEGATIVE NEGATIVE Final    Comment: (NOTE) SARS-CoV-2 target nucleic acids are NOT DETECTED. The SARS-CoV-2 RNA is generally detectable in upper and lower respiratory specimens during the acute phase of infection. Negative  results do not preclude SARS-CoV-2 infection, do not rule out co-infections with other pathogens, and should not be used as the sole basis for treatment or other patient management decisions. Negative results must be combined with clinical observations, patient history, and epidemiological information. The expected result is Negative. Fact Sheet for Patients: SugarRoll.be Fact Sheet for Healthcare Providers: https://www.woods-mathews.com/ This test is not yet approved or cleared by the [place] FDA and  has been authorized for detection and/or diagnosis of SARS-CoV-2 by FDA under an Emergency Use Authorization (EUA). This EUA will remain  in effect (meaning this test can be used) for the duration of the COVID-19 declaration under Section 56 4(b)(1) of the Act, 21 U.S.C. section 360bbb-3(b)(1), unless the authorization is terminated or revoked sooner. Performed at [facility], [place] [address] [place], [place] [address]      Studies: DG Chest Port 1 View  Result Date: 07/12/2019 CLINICAL DATA:  Knee pain after fall. Dizziness for weeks. Dizziness when standing. EXAM: PORTABLE CHEST 1 VIEW COMPARISON:  05/30/2019 FINDINGS: Chronic elevation of right hemidiaphragm. Lungs are clear. No focal airspace disease. No pulmonary edema. No pneumothorax. Left upper quadrant calcification is chronic in  represents an exophytic gastric mass is seen on prior cross-sectional imaging. No acute osseous abnormalities are seen. Minimal branching hyperdensity consistent with methylmethacrylate within the right lung from prior vertebral augmentation, unchanged. IMPRESSION: 1. No acute abnormality. 2. Chronic elevation of the right hemidiaphragm. Electronically Signed   By: Keith Rake M.D.   On: 07/12/2019 05:16   DG Knee Complete 4 Views Left  Result Date: 07/12/2019 CLINICAL DATA:  Left knee pain after fall. EXAM: LEFT KNEE - COMPLETE 4+ VIEW COMPARISON:  None. FINDINGS: No evidence of fracture, dislocation, or joint effusion. Medial tibiofemoral joint space narrowing with peripheral spurring. There is a prominent quadriceps tendon enthesophyte. Incidental note of vascular calcifications. IMPRESSION: 1. No fracture or subluxation of the left knee. 2. Osteoarthritis of the medial tibiofemoral compartment. Electronically Signed   By: Keith Rake M.D.   On: 07/12/2019 05:14    Scheduled Meds: . aspirin EC  81 mg Oral Q M,W,F  . atorvastatin  10 mg Oral q1800  . dicyclomine  20 mg Oral BID  . docusate sodium  100 mg Oral BID  . enoxaparin (LOVENOX) injection  40 mg Subcutaneous Q24H  . hydroxychloroquine  200 mg Oral BID  . insulin aspart  0-15 Units Subcutaneous TID WC  . insulin aspart  0-5 Units Subcutaneous QHS  . magnesium oxide  400 mg Oral Daily  . methylPREDNISolone  4 mg Oral Daily  . metoCLOPramide (REGLAN) injection  10 mg Intravenous TID AC & HS  . pantoprazole (PROTONIX) IV  40 mg Intravenous Q12H  . sodium chloride flush  3 mL Intravenous Q12H  . sucralfate  1 g Oral TID WC & HS   Continuous Infusions: . 0.9 % NaCl with KCl 20 mEq / L 75 mL/hr at 07/13/19 1231  . calcium gluconate    . potassium chloride 10 mEq (07/13/19 1233)      Time spent: [place]   07/13/2019, 1:08 PM  LOS: 1 day

## 2019-07-14 DIAGNOSIS — M05742 Rheumatoid arthritis with rheumatoid factor of left hand without organ or systems involvement: Secondary | ICD-10-CM

## 2019-07-14 DIAGNOSIS — M05741 Rheumatoid arthritis with rheumatoid factor of right hand without organ or systems involvement: Secondary | ICD-10-CM

## 2019-07-14 DIAGNOSIS — E876 Hypokalemia: Secondary | ICD-10-CM

## 2019-07-14 DIAGNOSIS — I10 Essential (primary) hypertension: Secondary | ICD-10-CM

## 2019-07-14 LAB — BASIC METABOLIC PANEL WITH GFR
Anion gap: 11 (ref 5–15)
BUN: <5 mg/dL — ABNORMAL LOW (ref 8–23)
CO2: 22 mmol/L (ref 22–32)
Calcium: 8.5 mg/dL — ABNORMAL LOW (ref 8.9–10.3)
Chloride: 107 mmol/L (ref 98–111)
Creatinine, Ser: 0.88 mg/dL (ref 0.44–1.00)
GFR calc Af Amer: >60 mL/min
GFR calc non Af Amer: >60 mL/min
Glucose, Bld: 89 mg/dL (ref 70–99)
Potassium: 3.6 mmol/L (ref 3.5–5.1)
Sodium: 140 mmol/L (ref 135–145)

## 2019-07-14 LAB — MAGNESIUM: Magnesium: 1.4 mg/dL — ABNORMAL LOW (ref 1.7–2.4)

## 2019-07-14 LAB — CBC
HCT: 31.1 % — ABNORMAL LOW (ref 36.0–46.0)
Hemoglobin: 10.1 g/dL — ABNORMAL LOW (ref 12.0–15.0)
MCH: 29.2 pg (ref 26.0–34.0)
MCHC: 32.5 g/dL (ref 30.0–36.0)
MCV: 89.9 fL (ref 80.0–100.0)
Platelets: 263 10*3/uL (ref 150–400)
RBC: 3.46 MIL/uL — ABNORMAL LOW (ref 3.87–5.11)
RDW: 14.8 % (ref 11.5–15.5)
WBC: 7.5 10*3/uL (ref 4.0–10.5)
nRBC: 0 % (ref 0.0–0.2)

## 2019-07-14 LAB — GLUCOSE, CAPILLARY
Glucose-Capillary: 83 mg/dL (ref 70–99)
Glucose-Capillary: 86 mg/dL (ref 70–99)

## 2019-07-14 MED ORDER — SULFAMETHOXAZOLE-TRIMETHOPRIM 800-160 MG PO TABS: 1.0000 | ORAL_TABLET | Freq: Two times a day (BID) | ORAL | 0 refills | Status: DC

## 2019-07-14 MED ORDER — METOCLOPRAMIDE HCL 5 MG PO TABS: ORAL_TABLET | ORAL | 0 refills | Status: DC

## 2019-07-14 NOTE — Discharge Instructions (Signed)
Follow with Primary MD [place], Sunday Spillers, PA-C in 7 days   Get CBC, CMP, 2 view Chest X ray checked  by Primary MD next visit.    Activity: As tolerated with Full fall precautions use walker/cane & assistance as needed   Disposition Home    Diet: Heart Healthy , with feeding assistance and aspiration precautions.   On your next visit with your primary care physician please Get Medicines reviewed and adjusted.   Please request your Prim.MD to go over all Hospital Tests and Procedure/Radiological results at the follow up, please get all Hospital records sent to your Prim MD by signing hospital release before you go home.   If you experience worsening of your admission symptoms, develop shortness of breath, life threatening emergency, suicidal or homicidal thoughts you must seek medical attention immediately by calling 911 or calling your MD immediately  if symptoms less severe.  You Must read complete instructions/literature along with all the possible adverse reactions/side effects for all the Medicines you take and that have been prescribed to you. Take any new Medicines after you have completely understood and accpet all the possible adverse reactions/side effects.   Do not drive, operating heavy machinery, perform activities at heights, swimming or participation in water activities or provide baby sitting services if your were admitted for syncope or siezures until you have seen by Primary MD or a Neurologist and advised to do so again.  Do not drive when taking Pain medications.    Do not take more than prescribed Pain, Sleep and Anxiety Medications  Special Instructions: If you have smoked or chewed Tobacco  in the last 2 yrs please stop smoking, stop any regular Alcohol  and or any Recreational drug use.  Wear Seat belts while driving.   Please note  You were cared for by a hospitalist during your hospital stay. If you have any questions about your discharge  medications or the care you received while you were in the hospital after you are discharged, you can call the unit and asked to speak with the hospitalist on call if the hospitalist that took care of you is not available. Once you are discharged, your primary care physician will handle any further medical issues. Please note that NO REFILLS for any discharge medications will be authorized once you are discharged, as it is imperative that you return to your primary care physician (or establish a relationship with a primary care physician if you do not have one) for your aftercare needs so that they can reassess your need for medications and monitor your lab values.

## 2019-07-14 NOTE — Progress Notes (Signed)
DISCHARGE NOTE HOME Dlisa Kaldenberg to be discharged Home per MD order. Discussed prescriptions and follow up appointments with the patient. Prescriptions given to patient; medication list explained in detail. Patient verbalized understanding.  Skin clean, dry and intact without evidence of skin break down, no evidence of skin tears noted. IV catheter discontinued intact. Site without signs and symptoms of complications. Dressing and pressure applied. Pt denies pain at the site currently. No complaints noted.  Patient free of lines, drains, and wounds.   An After Visit Summary (AVS) was printed and given to the patient. Patient escorted via wheelchair, and discharged home via private auto.  Arlyss Repress, RN

## 2019-07-14 NOTE — TOC Transition Note (Signed)
Transition of Care [facility]) - CM/SW Discharge Note   Patient Details  Name: [name] MRN: [id number] Date of Birth: Mar 10, 1936  [facility] CM/SW Contact:  Bartholomew Crews, RN Phone Number: [phone] 07/14/2019, 1:39 PM   Clinical Narrative:    Patient to transition home today. HH orders provided for PT and OT. Interim notified. No further TOC needs identified.    Final next level of care: [place] Barriers to Discharge: No Barriers Identified   Patient Goals and CMS Choice Patient states their goals for this hospitalization and ongoing recovery are:: home CMS Medicare.gov Compare Post Acute Care list provided to:: Patient Choice offered to / list presented to : Patient  Discharge Placement                       Discharge Plan and Services In-house Referral: Clinical Social Work Discharge Planning Services: CM Consult Post Acute Care Choice: Home Health          DME Arranged: N/A DME Agency: NA       HH Arranged: PT, OT HH Agency: [facility] Date [place]: 07/14/19 Time [place]: 1339 Representative spoke with at [place]: (interim)  Social Determinants of Health ([place]) Interventions     Readmission Risk Interventions No flowsheet data found.

## 2019-07-14 NOTE — Discharge Summary (Signed)
[name], is a 83 y.o. female  DOB 02/16/1936  MRN [id number].  Admission date:  07/12/2019  Admitting Physician  Karmen Bongo, MD  Discharge Date:  07/14/2019   Primary MD  Rodriguez-Southworth, Sunday Spillers, PA-C  Recommendations for primary care physician for things to follow:  -Please check CBC, CMP during next visit. -Patient to follow-up with GI as an outpatient, appointment has been scheduled by GI.   Admission Diagnosis  Unexplained weight loss [R63.4]   Discharge Diagnosis  Unexplained weight loss [R63.4]    Principal Problem:   Unexplained weight loss Active Problems:   Dehydration, mild   Immunosuppression due to chronic steroid use   Rheumatoid arthritis (HCC)   DM (diabetes mellitus) (HCC)   Elevated lactic acid level   COPD (chronic obstructive pulmonary disease) (HCC)   Chronic diastolic CHF (congestive heart failure) (HCC)   Essential hypertension   Hypokalemia   Hypomagnesemia   Non-intractable vomiting      Past Medical History:  Diagnosis Date  . Allergy   . Anemia    "when I was young"  . Anginal pain ([place])   . Anxiety   . Arthritis    "all over my body"   . Chest pain 04/07/2014  . Chronic diastolic heart failure, NYHA class 1 ([place]) 01/22/2015  . Chronic lower back pain   . Chronic steroid use 12/11/2012  . COPD (chronic obstructive pulmonary disease) ([place]) 01/09/2017  . 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 Elevated lactic acid level 01/22/2015  . Essential hypertension 06/15/2018  . Frequent UTI    "recently" (06/14/2015)  . Gastric mass   . GERD (gastroesophageal reflux disease)   . Headache(784.0)    "weekly; wake up w/them " (110/19/2018)  . Heart murmur   . High cholesterol   . History of blood transfusion ~ 1954   "related to my periods"  . Migraine    "once/month maybe" (05/16/2017)  . Multiple falls 06/14/2015   . Noncompliance 12/11/2012  . OSA (obstructive sleep apnea)    "I think I'm using a BiPAP" (06/14/2015)  . Sepsis ([place]) 01/09/2017  . Syncope and collapse 12/11/2012  . Tachycardia with heart rate 100-120 beats per minute 04/08/2014  . Type II diabetes mellitus ([place])   . Uncontrolled type 2 diabetes mellitus with complication [facility])     Past Surgical History:  Procedure Laterality Date  . APPENDECTOMY  1970   Archie Endo 12/12/2010  . BACK SURGERY    . BIOPSY  05/25/2019   Procedure: BIOPSY;  Surgeon: Rush Landmark Telford Nab., MD;  Location: [facility];  Service: Gastroenterology;;  . [place]   Archie Endo 12/12/2010  . ESOPHAGOGASTRODUODENOSCOPY N/A 05/25/2019   Procedure: ESOPHAGOGASTRODUODENOSCOPY (EGD);  Surgeon: Irving Copas., MD;  Location: [facility];  Service: Gastroenterology;  Laterality: N/A;  . EUS N/A 10/16/2017   Procedure: UPPER ENDOSCOPIC ULTRASOUND (EUS) RADIAL;  Surgeon: Milus Banister, MD;  Location: [facility];  Service: Endoscopy;  Laterality: N/A;  . FRACTURE SURGERY    .  LAPAROSCOPIC CHOLECYSTECTOMY  1990's  . LUMBAR MICRODISCECTOMY Left 09/2006   L5-S1/notes 12/12/2010  . MULTIPLE EXTRACTIONS WITH ALVEOLOPLASTY Bilateral 06/13/2017   Procedure: MULTIPLE EXTRACTION WITH ALVEOLOPLASTY;  Surgeon: Diona Browner, DDS;  Location: [facility];  Service: Oral Surgery;  Laterality: Bilateral;  . MULTIPLE TOOTH EXTRACTIONS    . ORIF SHOULDER FRACTURE Left ~ 2009   "put cement down in it"   . TONSILLECTOMY  1950's?  . TOTAL ABDOMINAL HYSTERECTOMY  1979   Archie Endo 12/12/2010       History of present illness and  Hospital Course:     Kindly see H&P for history of present illness and admission details, please review complete Labs, Consult reports and Test reports for all details in brief  HPI  from the history and physical done on the day of admission 07/12/2019  HPI: [name] is a 83 y.o. female with medical history significant of DM; OSA  on BIPAP; HTN; HLD; COPD; RA on chronic immunosuppression; and chronic diastolic CHF presenting with syncope.  The first time she came to the hospital, she had a bad stomach virus.  She had EGD and she was given prescriptions and discharged.  She wasn't any better and continuing to have daily vomiting and decreased PO intake (this is persisting).  She returned to the hospital with the same symptoms.  They kept her a couple of days and sent her home.  Since then, she can't eat, losing weight, can't even walk from her bedroom to the bathroom - she is too dizzy, shaky, can't make it.  She has lost maybe 45 pounds in the last maybe 2 months.  +headaches, + vision changes (?related to orthostasis, only when she stands up and gets dizzy).  +chest pain about every other day (epigastric pain).  The Carafate is not helping.  Constantly nauseated, occasionally vomiting, frequent dry heaves.  +chronic constipation.    No urinary symptoms.  No skin rash.  +night sweats.  She fell last night, awoke with posterior thigh pain.  She did pass out when she fell.   Her last hospitalization was from 10/31-11/2 and before that 10/21-28.   She had a GI consult with EGD that showed chronic gastritis; negative urine culture, treated with 3 days of Cefepime; and AKI that resolved with IVF (creatinine 4.91 -> 0.8).  ED Course:  Frequent n/v, poor PO intake, frequent visits.  Tachycardia with some improvement with IVF.  Lactate is 3, rectal temp 100.1, rigors.     Hospital Course   Nausea and vomiting -This is most likely in the setting of gastroparesis, GI input greatly appreciated, she was kept on IV Reglan, IV Protonix, with significant improvement of her symptoms, she is tolerating soft diet, so she will be discharged on p.o. Reglan, and Protonix, and to follow-up with GI as an outpatient.  Hypokalemia/hypomagnesemia/hypocalcemia -Repleted  UTI -Recieved 2 days of Rocephin during hospital stay, she will be discharged  on 1 day on oral Bactrim  Hypertension -Resume home medication on discharge  Rheumatoid arthritis -Resume home medications on discharge  Discharge Condition:  Stable   Follow UP  Follow-up Information    [facility] Follow up.   Specialty: [place] Why: Someone from [facility] will call to schedule therapy appointments.  Contact information: [address] [place] [phone]        Armbruster, Steven P, MD Follow up on 08/23/2019.   Specialty: Gastroenterology Why: 1:30 PM follow up with gastrointestinal MD.  Contact information: [address] [place] [address] [phone]             Discharge Instructions  and  Discharge Medications     Discharge Instructions    Discharge instructions   Complete by: As directed    Follow with Primary MD Rodriguez-Southworth, Sunday Spillers, PA-C in 7 days   Get CBC, CMP, 2 view Chest X ray checked  by Primary MD next visit.    Activity: As tolerated with Full fall precautions use walker/cane & assistance as needed   Disposition Home    Diet: Heart Healthy , with feeding assistance and aspiration precautions.   On your next visit with your primary care physician please Get Medicines reviewed and adjusted.   Please request your Prim.MD to go over all Hospital Tests and Procedure/Radiological results at the follow up, please get all Hospital records sent to your Prim MD by signing hospital release before you go home.   If you experience worsening of your admission symptoms, develop shortness of breath, life threatening emergency, suicidal or homicidal thoughts you must seek medical attention immediately by calling 911 or calling your MD immediately  if symptoms less severe.  You Must read complete instructions/literature along with all the possible adverse reactions/side effects for all the Medicines you take and that have been prescribed to you. Take any new  Medicines after you have completely understood and accpet all the possible adverse reactions/side effects.   Do not drive, operating heavy machinery, perform activities at heights, swimming or participation in water activities or provide baby sitting services if your were admitted for syncope or siezures until you have seen by Primary MD or a Neurologist and advised to do so again.  Do not drive when taking Pain medications.    Do not take more than prescribed Pain, Sleep and Anxiety Medications  Special Instructions: If you have smoked or chewed Tobacco  in the last 2 yrs please stop smoking, stop any regular Alcohol  and or any Recreational drug use.  Wear Seat belts while driving.   Please note  You were cared for by a hospitalist during your hospital stay. If you have any questions about your discharge medications or the care you received while you were in the hospital after you are discharged, you can call the unit and asked to speak with the hospitalist on call if the hospitalist that took care of you is not available. Once you are discharged, your primary care physician will handle any further medical issues. Please note that NO REFILLS for any discharge medications will be authorized once you are discharged, as it is imperative that you return to your primary care physician (or establish a relationship with a primary care physician if you do not have one) for your aftercare needs so that they can reassess your need for medications and monitor your lab values.   Increase activity slowly   Complete by: As directed      Allergies as of 07/14/2019   No Known Allergies     Medication List    TAKE these medications   albuterol 0.63 MG/3ML nebulizer solution Commonly known as: ACCUNEB Take 2 ampules by nebulization every 6 (six) hours as needed for wheezing.   aspirin EC 81 MG tablet Take 1 tablet (81 mg total) by mouth daily. What changed: when to take this   atorvastatin 10 MG  tablet Commonly known as: Lipitor Take 1 Tablet by mouth daily What changed:   how  much to take  how to take this  when to take this  additional instructions   dicyclomine 20 MG tablet Commonly known as: BENTYL Take 20 mg by mouth 2 (two) times daily.   Gas-X Extra Strength 125 MG chewable tablet Generic drug: simethicone Chew 125 mg by mouth every 6 (six) hours as needed for flatulence.   hydroxychloroquine 200 MG tablet Commonly known as: PLAQUENIL Take 200 mg by mouth 2 (two) times daily.   hydrOXYzine 10 MG tablet Commonly known as: ATARAX/VISTARIL 1 q 8h prn itching What changed:   how much to take  how to take this  when to take this  reasons to take this  additional instructions   lisinopril-hydrochlorothiazide 10-12.5 MG tablet Commonly known as: ZESTORETIC Take 1 tablet by mouth daily.   MAGnesium-Oxide 400 (241.3 Mg) MG tablet Generic drug: magnesium oxide Take 1 tablet by mouth daily.   methylPREDNISolone 4 MG tablet Commonly known as: MEDROL Take 4 mg by mouth daily.   metoCLOPramide 5 MG tablet Commonly known as: Reglan Take 5 mg oral 3 times daily before meals x5 days, then change 3 times daily before meals as needed. What changed:   how much to take  how to take this  when to take this  additional instructions   ondansetron 4 MG disintegrating tablet Commonly known as: Zofran ODT Take 1 tablet (4 mg total) by mouth every 8 (eight) hours as needed for nausea or vomiting.   OneTouch Verio test strip Generic drug: glucose blood Use to check blood sugar 3 to 4 times a day before meals   pantoprazole 40 MG tablet Commonly known as: PROTONIX Take 1 tablet (40 mg total) by mouth 2 (two) times daily. Take 1 tablet, 40 mg, by mouth 2 times daily x6 weeks then 1 tablet, 40 mg once daily indefinitely What changed:   when to take this  additional instructions   promethazine 25 MG tablet Commonly known as: PHENERGAN Take 1 tablet  (25 mg total) by mouth every 6 (six) hours as needed for nausea or vomiting.   sucralfate 1 GM/10ML suspension Commonly known as: CARAFATE Take 10 mLs (1 g total) by mouth 4 (four) times daily -  with meals and at bedtime.   sulfamethoxazole-trimethoprim 800-160 MG tablet Commonly known as: BACTRIM DS Take 1 tablet by mouth 2 (two) times daily. Start taking on: July 15, 2019         Diet and Activity recommendation: See Discharge Instructions above   Consults obtained -  GI   Major procedures and Radiology Reports - PLEASE review detailed and final reports for all details, in brief -     DG Chest Port 1 View  Result Date: 07/12/2019 CLINICAL DATA:  Knee pain after fall. Dizziness for weeks. Dizziness when standing. EXAM: PORTABLE CHEST 1 VIEW COMPARISON:  05/30/2019 FINDINGS: Chronic elevation of right hemidiaphragm. Lungs are clear. No focal airspace disease. No pulmonary edema. No pneumothorax. Left upper quadrant calcification is chronic in represents an exophytic gastric mass is seen on prior cross-sectional imaging. No acute osseous abnormalities are seen. Minimal branching hyperdensity consistent with methylmethacrylate within the right lung from prior vertebral augmentation, unchanged. IMPRESSION: 1. No acute abnormality. 2. Chronic elevation of the right hemidiaphragm. Electronically Signed   By: Keith Rake M.D.   On: 07/12/2019 05:16   DG Knee Complete 4 Views Left  Result Date: 07/12/2019 CLINICAL DATA:  Left knee pain after fall. EXAM: LEFT KNEE - COMPLETE 4+ VIEW COMPARISON:  None. FINDINGS: No evidence of fracture, dislocation, or joint effusion. Medial tibiofemoral joint space narrowing with peripheral spurring. There is a prominent quadriceps tendon enthesophyte. Incidental note of vascular calcifications. IMPRESSION: 1. No fracture or subluxation of the left knee. 2. Osteoarthritis of the medial tibiofemoral compartment. Electronically Signed   By: Keith Rake M.D.   On: 07/12/2019 05:14    Micro Results     Recent Results (from the past 240 hour(s))  Blood culture (routine x 2)     Status: None (Preliminary result)   Collection Time: 07/12/19  4:25 AM   Specimen: BLOOD  Result Value Ref Range Status   Specimen Description BLOOD SITE NOT SPECIFIED  Final   Special Requests   Final    BOTTLES DRAWN AEROBIC AND ANAEROBIC Blood Culture adequate volume   Culture   Final    NO GROWTH 2 DAYS Performed at [facility], [address] [place], [place] [address]    Report Status PENDING  Incomplete  Blood culture (routine x 2)     Status: None (Preliminary result)   Collection Time: 07/12/19  5:35 AM   Specimen: BLOOD  Result Value Ref Range Status   Specimen Description BLOOD RIGHT ANTECUBITAL  Final   Special Requests   Final    BOTTLES DRAWN AEROBIC ONLY Blood Culture results may not be optimal due to an inadequate volume of blood received in culture bottles   Culture   Final    NO GROWTH 2 DAYS Performed at [facility], [place] [address], [place], [place] [address]    Report Status PENDING  Incomplete  SARS CORONAVIRUS 2 (TAT 6-24 HRS) Nasopharyngeal Nasopharyngeal Swab     Status: None   Collection Time: 07/12/19  7:22 AM   Specimen: Nasopharyngeal Swab  Result Value Ref Range Status   SARS Coronavirus 2 NEGATIVE NEGATIVE Final    Comment: (NOTE) SARS-CoV-2 target nucleic acids are NOT DETECTED. The SARS-CoV-2 RNA is generally detectable in upper and lower respiratory specimens during the acute phase of infection. Negative results do not preclude SARS-CoV-2 infection, do not rule out co-infections with other pathogens, and should not be used as the sole basis for treatment or other patient management decisions. Negative results must be combined with clinical observations, patient history, and epidemiological information. The expected result is Negative. Fact Sheet for  Patients: SugarRoll.be Fact Sheet for Healthcare Providers: https://www.woods-mathews.com/ This test is not yet approved or cleared by the [place] FDA and  has been authorized for detection and/or diagnosis of SARS-CoV-2 by FDA under an Emergency Use Authorization (EUA). This EUA will remain  in effect (meaning this test can be used) for the duration of the COVID-19 declaration under Section 56 4(b)(1) of the Act, 21 U.S.C. section 360bbb-3(b)(1), unless the authorization is terminated or revoked sooner. Performed at [facility], [place] [address] [place], [place] [address]   Culture, Urine     Status: Abnormal (Preliminary result)   Collection Time: 07/13/19  8:09 AM   Specimen: Urine, Random  Result Value Ref Range Status   Specimen Description URINE, RANDOM  Final   Special Requests NONE  Final   Culture (A)  Final    >=100,000 COLONIES/mL GRAM NEGATIVE RODS IDENTIFICATION AND SUSCEPTIBILITIES TO FOLLOW Performed at [facility], [address], [place],  [address]    Report Status PENDING  Incomplete       Today   Subjective:   [name] today has  no headache,no chest or abdominal pain, she denies any nausea or vomiting today, reports her appetite has improved (actually she reports she did eat 2 bowls of grits for breakfast ). Objective:   Blood pressure (!) 146/90, pulse 100, temperature 98 F (36.7 C), temperature source Oral, resp. rate 18, weight 56.8 kg, SpO2 98 %.   Intake/Output Summary (Last 24 hours) at 07/14/2019 1320 Last data filed at 07/14/2019 1307 Gross per 24 hour  Intake 3370 ml  Output 0 ml  Net 3370 ml    Exam Awake Alert, Oriented x 3, No new F.N deficits, Normal affect Symmetrical Chest wall movement, Good air movement bilaterally, CTAB RRR,No Gallops,Rubs or new Murmurs, No Parasternal Heave +ve B.Sounds, Abd Soft, Non tender, No rebound -guarding or rigidity. No  Cyanosis, Clubbing or edema, No new Rash or bruise  Data Review   CBC w Diff:  Lab Results  Component Value Date   WBC 7.5 07/14/2019   HGB 10.1 (L) 07/14/2019   HGB 13.0 06/10/2019   HCT 31.1 (L) 07/14/2019   HCT 39.3 06/10/2019   PLT 263 07/14/2019   PLT 407 06/10/2019   LYMPHOPCT 28 05/19/2019   MONOPCT 6 05/19/2019   EOSPCT 1 05/19/2019   BASOPCT 1 05/19/2019    CMP:  Lab Results  Component Value Date   NA 140 07/14/2019   NA 144 06/17/2019   K 3.6 07/14/2019   CL 107 07/14/2019   CO2 22 07/14/2019   BUN <5 (L) 07/14/2019   BUN 9 06/17/2019   CREATININE 0.88 07/14/2019   PROT 6.7 07/12/2019   PROT 6.9 06/10/2019   ALBUMIN 3.3 (L) 07/12/2019   ALBUMIN 4.2 06/10/2019   BILITOT 0.7 07/12/2019   BILITOT 0.4 06/10/2019   ALKPHOS 42 07/12/2019   AST 34 07/12/2019   ALT 16 07/12/2019  .   Total Time in preparing paper work, data evaluation and todays exam - 50 minutes  Phillips Climes M.D on 07/14/2019 at 1:20 PM  [facility]   Office  [phone]

## 2019-07-14 NOTE — Progress Notes (Signed)
Pt refusing bed alarm. RN educated. Call light with in reach. Will continue to monitor.

## 2019-07-15 ENCOUNTER — Ambulatory Visit (INDEPENDENT_AMBULATORY_CARE_PROVIDER_SITE_OTHER): Payer: Medicare Other | Admitting: Internal Medicine

## 2019-07-15 ENCOUNTER — Other Ambulatory Visit: Payer: Self-pay

## 2019-07-15 ENCOUNTER — Encounter: Payer: Self-pay | Admitting: Internal Medicine

## 2019-07-15 VITALS — BP 124/88 | HR 104 | Temp 97.8°F | Ht 62.0 in | Wt 122.8 lb

## 2019-07-15 DIAGNOSIS — Z1211 Encounter for screening for malignant neoplasm of colon: Secondary | ICD-10-CM

## 2019-07-15 DIAGNOSIS — Z09 Encounter for follow-up examination after completed treatment for conditions other than malignant neoplasm: Secondary | ICD-10-CM | POA: Diagnosis not present

## 2019-07-15 DIAGNOSIS — R634 Abnormal weight loss: Secondary | ICD-10-CM

## 2019-07-15 DIAGNOSIS — I10 Essential (primary) hypertension: Secondary | ICD-10-CM

## 2019-07-15 DIAGNOSIS — N390 Urinary tract infection, site not specified: Secondary | ICD-10-CM

## 2019-07-15 DIAGNOSIS — K602 Anal fissure, unspecified: Secondary | ICD-10-CM

## 2019-07-15 LAB — URINE CULTURE: Culture: >=100000 — AB

## 2019-07-15 LAB — POC HEMOCCULT BLD/STL (OFFICE/1-CARD/DIAGNOSTIC): Fecal Occult Blood, POC: NEGATIVE

## 2019-07-15 MED ORDER — HYDROCORTISONE (PERIANAL) 2.5 % EX CREA: 1.0000 "application " | TOPICAL_CREAM | Freq: Three times a day (TID) | CUTANEOUS | 0 refills | Status: AC

## 2019-07-15 MED ORDER — TRAMADOL HCL 50 MG PO TABS: 50.0000 mg | ORAL_TABLET | Freq: Four times a day (QID) | ORAL | 0 refills | Status: DC | PRN

## 2019-07-15 NOTE — Patient Instructions (Signed)
Do sits bath's twice a day with the help of your aid or granddaughter for 15 minutes, pat the area dry and apply the anusol cream on it. Make sure your stools stay soft.    Anal Fissure, Adult  An anal fissure is a small tear or crack in the tissue around the opening of the butt (anus). Bleeding from the tear or crack usually stops on its own within a few minutes. The bleeding may happen every time you poop (have a bowel movement) until the tear or crack heals. What are the causes? This condition is usually caused by passing a large or hard poop (stool). Other causes include:  Trouble pooping (constipation).  Passing watery poop (diarrhea).  Inflammatory bowel disease (Crohn's disease or ulcerative colitis).  Childbirth.  Infections.  Anal sex. What are the signs or symptoms? Symptoms of this condition include:  Bleeding from the butt.  Small amounts of blood on your poop. The blood coats the outside of the poop. It is not mixed with the poop.  Small amounts of blood on the toilet paper or in the toilet after you poop.  Pain when passing poop.  Itching or irritation around the opening of the butt. How is this diagnosed? This condition may be diagnosed based on a physical exam. Your doctor may:  Check your butt. A tear can often be seen by checking the area with care.  Check your butt using a short tube (anoscope). The light in the tube will show any problems in your butt. How is this treated? Treatment for this condition may include:  Treating problems that make it hard for you to pass poop. You may be told to: ? Eat more fiber. ? Drink more fluid. ? Take fiber supplements. ? Take medicines that make poop soft.  Taking sitz baths. This may help to heal the tear.  Using creams and ointments. If your condition gets worse, other treatments may be needed such as:  A shot near the tear or crack (botulinum injection).  Surgery to repair the tear or crack. Follow these  instructions at home: Eating and drinking   Avoid bananas and dairy products. These foods can make it hard to poop.  Drink enough fluid to keep your pee (urine) pale yellow.  Eat foods that have a lot of fiber in them, such as: ? Beans. ? Whole grains. ? Fresh fruits. ? Fresh vegetables. General instructions   Take over-the-counter and prescription medicines only as told by your doctor.  Use creams or ointments only as told by your doctor.  Keep the butt area as clean and dry as you can.  Take a warm water bath (sitz bath) as told by your doctor. Do not use soap.  Keep all follow-up visits as told by your doctor. This is important. Contact a doctor if:  You have more bleeding.  You have a fever.  You have watery poop that is mixed with blood.  You have pain.  Your problem gets worse, not better. Summary  An anal fissure is a small tear or crack in the skin around the opening of the butt (anus).  This condition is usually caused by passing a large or hard poop (stool).  Treatment includes treating the problems that make it hard for you to pass poop.  Follow your doctor's instructions about caring for your condition at home.  Keep all follow-up visits as told by your doctor. This is important. This information is not intended to replace advice given  to you by your health care provider. Make sure you discuss any questions you have with your health care provider. Document Released: 03/13/2011 Document Revised: 12/25/2017 Document Reviewed: 12/25/2017 Elsevier Patient Education  2020 [employer].

## 2019-07-15 NOTE — Progress Notes (Addendum)
This visit occurred during the SARS-CoV-2 public health emergency.  Safety protocols were in place, including screening questions prior to the visit, additional usage of staff PPE, and extensive cleaning of exam room while observing appropriate contact time as indicated for disinfecting solutions.  Subjective:     Patient ID: [name] , female    DOB: 20-Feb-1936 , 83 y.o.   MRN: [id number]   Chief Complaint  Patient presents with  . Hospitalization Follow-up    HPI Pt is here for hospital FU for dehydration, weight loss and hypomagnesemia. Has not been vomiting at all since discharged.  Has GI apt 1/25. She was approved to have a care giver.  Sine home yesterday and in the pm saw blood in her stool and this am also had some more blood. Pt had felt rectal pain.  She has not started the bactrim for UTI yet.   Past Medical History:  Diagnosis Date  . Allergy   . Anemia    "when I was young"  . Anginal pain ([place])   . Anxiety   . Arthritis    "all over my body"   . Chest pain 04/07/2014  . Chronic diastolic heart failure, NYHA class 1 ([place]) 01/22/2015  . Chronic lower back pain   . Chronic steroid use 12/11/2012  . COPD (chronic obstructive pulmonary disease) ([place]) 01/09/2017  . 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 Elevated lactic acid level 01/22/2015  . Essential hypertension 06/15/2018  . Frequent UTI    "recently" (06/14/2015)  . Gastric mass   . GERD (gastroesophageal reflux disease)   . Headache(784.0)    "weekly; wake up w/them " (110/19/2018)  . Heart murmur   . High cholesterol   . History of blood transfusion ~ 1954   "related to my periods"  . Migraine    "once/month maybe" (05/16/2017)  . Multiple falls 06/14/2015  . Noncompliance 12/11/2012  . OSA (obstructive sleep apnea)    "I think I'm using a BiPAP" (06/14/2015)  . Sepsis ([place]) 01/09/2017  . Syncope and collapse 12/11/2012  . Tachycardia with heart rate 100-120 beats per  minute 04/08/2014  . Type II diabetes mellitus ([place])   . Uncontrolled type 2 diabetes mellitus with complication ([facility])      Family History  Problem Relation Age of Onset  . Hypertension Mother   . Heart attack Father   . Diabetes Mellitus II Sister   . Hypertension Sister   . Cancer - Other Sister   . Migraines Niece   . Colon cancer Neg Hx      Current Outpatient Medications:  .  albuterol (ACCUNEB) 0.63 MG/3ML nebulizer solution, Take 2 ampules by nebulization every 6 (six) hours as needed for wheezing., Disp: , Rfl:  .  aspirin EC 81 MG tablet, Take 1 tablet (81 mg total) by mouth daily. (Patient taking differently: Take 81 mg by mouth every Monday, Wednesday, and Friday. ), Disp: , Rfl:  .  atorvastatin (LIPITOR) 10 MG tablet, Take 1 Tablet by mouth daily (Patient taking differently: Take 10 mg by mouth daily at 6 PM. ), Disp: 90 tablet, Rfl: 1 .  dicyclomine (BENTYL) 20 MG tablet, Take 20 mg by mouth 2 (two) times daily., Disp: , Rfl: 1 .  glucose blood (ONETOUCH VERIO) test strip, Use to check blood sugar 3 to 4 times a day before meals, Disp: 100 each, Rfl: 12 .  hydroxychloroquine (PLAQUENIL) 200 MG tablet, Take 200 mg by mouth 2 (two)  times daily., Disp: , Rfl:  .  hydrOXYzine (ATARAX/VISTARIL) 10 MG tablet, 1 q 8h prn itching (Patient taking differently: Take 10 mg by mouth every 8 (eight) hours as needed for itching. ), Disp: 30 tablet, Rfl: 0 .  lisinopril-hydrochlorothiazide (ZESTORETIC) 10-12.5 MG tablet, Take 1 tablet by mouth daily., Disp: , Rfl:  .  MAGNESIUM-OXIDE 400 (241.3 Mg) MG tablet, Take 1 tablet by mouth daily., Disp: , Rfl:  .  methylPREDNISolone (MEDROL) 4 MG tablet, Take 4 mg by mouth daily., Disp: , Rfl:  .  metoCLOPramide (REGLAN) 5 MG tablet, Take 5 mg oral 3 times daily before meals x5 days, then change 3 times daily before meals as needed., Disp: 90 tablet, Rfl: 0 .  ondansetron (ZOFRAN ODT) 4 MG disintegrating tablet, Take 1 tablet (4 mg total) by mouth  every 8 (eight) hours as needed for nausea or vomiting., Disp: 30 tablet, Rfl: 0 .  pantoprazole (PROTONIX) 40 MG tablet, Take 1 tablet (40 mg total) by mouth 2 (two) times daily. Take 1 tablet, 40 mg, by mouth 2 times daily x6 weeks then 1 tablet, 40 mg once daily indefinitely (Patient taking differently: Take 40 mg by mouth daily. ), Disp: 120 tablet, Rfl: 0 .  simethicone (GAS-X EXTRA STRENGTH) 125 MG chewable tablet, Chew 125 mg by mouth every 6 (six) hours as needed for flatulence., Disp: , Rfl:  .  sucralfate (CARAFATE) 1 GM/10ML suspension, Take 10 mLs (1 g total) by mouth 4 (four) times daily -  with meals and at bedtime., Disp: 420 mL, Rfl: 0 .  sulfamethoxazole-trimethoprim (BACTRIM DS) 800-160 MG tablet, Take 1 tablet by mouth 2 (two) times daily., Disp: 2 tablet, Rfl: 0 .  promethazine (PHENERGAN) 25 MG tablet, Take 1 tablet (25 mg total) by mouth every 6 (six) hours as needed for nausea or vomiting., Disp: 30 tablet, Rfl: 0   No Known Allergies   Review of Systems  + low back pain, + fatigue, denies abdominal pain, rectal pain only with BM and denies hard stools, they are pasty. Denies vomiting, CP, palpitations. Has been having urgency, but no dysuria. Denies having trouble emptying her bladder.  Today's Vitals   07/15/19 1442  BP: 124/88  Pulse: (!) 104  Temp: 97.8 F (36.6 C)  TempSrc: Oral  SpO2: 96%  Weight: 122 lb 12.8 oz (55.7 kg)  Height: 5\' 2"  (1.575 m)   Body mass index is 22.46 kg/m.   Objective:  Physical Exam HENT:     Right Ear: External ear normal.     Left Ear: External ear normal.     Nose: Nose normal.     Mouth/Throat:     Mouth: Mucous membranes are moist.  Eyes:     General: No scleral icterus.    Conjunctiva/sclera: Conjunctivae normal.  Cardiovascular:     Rate and Rhythm: Normal rate and regular rhythm.  Pulmonary:     Effort: Pulmonary effort is normal.     Breath sounds: Normal breath sounds.  Abdominal:     General: Bowel sounds are  normal. There is no distension.     Palpations: Abdomen is soft. There is no mass.     Tenderness: There is no abdominal tenderness. There is no guarding or rebound.  Genitourinary:    Rectum: Guaiac result negative.     Comments: Has a fissure at 6 o'clock which is tender to palpation.  Musculoskeletal:     Cervical back: Neck supple.  Skin:    General: Skin  is warm and dry.     Findings: No rash.  Neurological:     Mental Status: She is alert and oriented to person, place, and time.     Gait: Gait normal.  Psychiatric:        Mood and Affect: Mood normal.        Behavior: Behavior normal.        Thought Content: Thought content normal.        Judgment: Judgment normal.     Down 3 lbs since one month ago.     Assessment And Plan:    1. Hypomagnesemia- persisted - Magnesium  2. Weight loss- unknown cause. FU 2 weeks.   3. Essential hypertension- stable, may continue current med - CBC no Diff - CMP14 + Anion Gap  4. Urinary tract infection without hematuria, site unspecified- acute     Encouraged to start the Bactrim today  5. Hospital discharge follow-up- I reviewed her hospital notes, reconciled her medication list, her urine culture is positive and sensitive to Bactrim which she was prescribed yesterday.   6. Anal Fissure- advised to do sits baths and was placed on Anusol cream as noted. Needs to continue keeping her stools soft. FU in 2 weeks.   Ercell Perlman RODRIGUEZ-SOUTHWORTH, PA-C    THE PATIENT IS ENCOURAGED TO PRACTICE SOCIAL DISTANCING DUE TO THE COVID-19 PANDEMIC.

## 2019-07-16 LAB — CMP14 + ANION GAP
ALT: 10 IU/L (ref 0–32)
AST: 27 IU/L (ref 0–40)
Albumin/Globulin Ratio: 1.5 (ref 1.2–2.2)
Albumin: 3.5 g/dL — ABNORMAL LOW (ref 3.6–4.6)
Alkaline Phosphatase: 48 IU/L (ref 39–117)
Anion Gap: 18 mmol/L (ref 10.0–18.0)
BUN/Creatinine Ratio: 2 — ABNORMAL LOW (ref 12–28)
BUN: 2 mg/dL — ABNORMAL LOW (ref 8–27)
Bilirubin Total: 0.5 mg/dL (ref 0.0–1.2)
CO2: 20 mmol/L (ref 20–29)
Calcium: 8.9 mg/dL (ref 8.7–10.3)
Chloride: 102 mmol/L (ref 96–106)
Creatinine, Ser: 0.84 mg/dL (ref 0.57–1.00)
GFR calc Af Amer: 75 mL/min/{1.73_m2} (ref >59)
GFR calc non Af Amer: 65 mL/min/{1.73_m2} (ref >59)
Globulin, Total: 2.3 g/dL (ref 1.5–4.5)
Glucose: 133 mg/dL — ABNORMAL HIGH (ref 65–99)
Potassium: 3.5 mmol/L (ref 3.5–5.2)
Sodium: 140 mmol/L (ref 134–144)
Total Protein: 5.8 g/dL — ABNORMAL LOW (ref 6.0–8.5)

## 2019-07-16 LAB — CBC
Hematocrit: 33.3 % — ABNORMAL LOW (ref 34.0–46.6)
Hemoglobin: 10.9 g/dL — ABNORMAL LOW (ref 11.1–15.9)
MCH: 28.8 pg (ref 26.6–33.0)
MCHC: 32.7 g/dL (ref 31.5–35.7)
MCV: 88 fL (ref 79–97)
Platelets: 321 10*3/uL (ref 150–450)
RBC: 3.78 x10E6/uL (ref 3.77–5.28)
RDW: 13.9 % (ref 11.7–15.4)
WBC: 6.9 10*3/uL (ref 3.4–10.8)

## 2019-07-16 LAB — MAGNESIUM: Magnesium: 1.2 mg/dL — ABNORMAL LOW (ref 1.6–2.3)

## 2019-07-17 LAB — CULTURE, BLOOD (ROUTINE X 2)
Culture: NO GROWTH
Culture: NO GROWTH
Special Requests: ADEQUATE

## 2019-07-19 ENCOUNTER — Telehealth: Payer: Self-pay

## 2019-07-19 NOTE — Telephone Encounter (Signed)
Spoke with pt   Rodriguez-[place], Sunday Spillers, PA-C  Candiss Norse T, CMA  Please inform pt that her anemia is a little better, but her magnesium is lower and I need her to double up on that dose. Also her protein is low and needs to eat more foods like fish, nut butter, beans. Her kidney and liver function is normal.

## 2019-07-31 ENCOUNTER — Other Ambulatory Visit: Payer: Self-pay

## 2019-07-31 ENCOUNTER — Encounter (HOSPITAL_COMMUNITY): Payer: Self-pay | Admitting: Emergency Medicine

## 2019-07-31 ENCOUNTER — Inpatient Hospital Stay (HOSPITAL_COMMUNITY)
Admission: EM | Admit: 2019-07-31 | Discharge: 2019-08-03 | DRG: 690 | Disposition: A | Discharge status: Home / Self Care | Payer: Medicare Other | Attending: Internal Medicine | Admitting: Internal Medicine

## 2019-07-31 DIAGNOSIS — Z87440 Personal history of urinary (tract) infections: Secondary | ICD-10-CM

## 2019-07-31 DIAGNOSIS — N390 Urinary tract infection, site not specified: Principal | ICD-10-CM | POA: Diagnosis present

## 2019-07-31 DIAGNOSIS — I5032 Chronic diastolic (congestive) heart failure: Secondary | ICD-10-CM | POA: Diagnosis present

## 2019-07-31 DIAGNOSIS — E1143 Type 2 diabetes mellitus with diabetic autonomic (poly)neuropathy: Secondary | ICD-10-CM | POA: Diagnosis present

## 2019-07-31 DIAGNOSIS — Z7982 Long term (current) use of aspirin: Secondary | ICD-10-CM

## 2019-07-31 DIAGNOSIS — Z833 Family history of diabetes mellitus: Secondary | ICD-10-CM

## 2019-07-31 DIAGNOSIS — R399 Unspecified symptoms and signs involving the genitourinary system: Secondary | ICD-10-CM | POA: Diagnosis not present

## 2019-07-31 DIAGNOSIS — Z90710 Acquired absence of both cervix and uterus: Secondary | ICD-10-CM

## 2019-07-31 DIAGNOSIS — N179 Acute kidney failure, unspecified: Secondary | ICD-10-CM | POA: Diagnosis present

## 2019-07-31 DIAGNOSIS — I110 Hypertensive heart disease with heart failure: Secondary | ICD-10-CM | POA: Diagnosis present

## 2019-07-31 DIAGNOSIS — D84821 Immunodeficiency due to drugs: Secondary | ICD-10-CM | POA: Diagnosis present

## 2019-07-31 DIAGNOSIS — N3001 Acute cystitis with hematuria: Secondary | ICD-10-CM

## 2019-07-31 DIAGNOSIS — G4733 Obstructive sleep apnea (adult) (pediatric): Secondary | ICD-10-CM | POA: Diagnosis present

## 2019-07-31 DIAGNOSIS — Z79891 Long term (current) use of opiate analgesic: Secondary | ICD-10-CM

## 2019-07-31 DIAGNOSIS — D649 Anemia, unspecified: Secondary | ICD-10-CM | POA: Diagnosis present

## 2019-07-31 DIAGNOSIS — Z7952 Long term (current) use of systemic steroids: Secondary | ICD-10-CM | POA: Diagnosis present

## 2019-07-31 DIAGNOSIS — E119 Type 2 diabetes mellitus without complications: Secondary | ICD-10-CM

## 2019-07-31 DIAGNOSIS — I10 Essential (primary) hypertension: Secondary | ICD-10-CM | POA: Diagnosis present

## 2019-07-31 DIAGNOSIS — Z20822 Contact with and (suspected) exposure to covid-19: Secondary | ICD-10-CM | POA: Diagnosis present

## 2019-07-31 DIAGNOSIS — Z9049 Acquired absence of other specified parts of digestive tract: Secondary | ICD-10-CM

## 2019-07-31 DIAGNOSIS — Z87891 Personal history of nicotine dependence: Secondary | ICD-10-CM

## 2019-07-31 DIAGNOSIS — Z79899 Other long term (current) drug therapy: Secondary | ICD-10-CM

## 2019-07-31 DIAGNOSIS — E878 Other disorders of electrolyte and fluid balance, not elsewhere classified: Secondary | ICD-10-CM | POA: Diagnosis present

## 2019-07-31 DIAGNOSIS — E785 Hyperlipidemia, unspecified: Secondary | ICD-10-CM | POA: Diagnosis present

## 2019-07-31 DIAGNOSIS — E871 Hypo-osmolality and hyponatremia: Secondary | ICD-10-CM | POA: Diagnosis present

## 2019-07-31 DIAGNOSIS — Z66 Do not resuscitate: Secondary | ICD-10-CM | POA: Diagnosis present

## 2019-07-31 DIAGNOSIS — B9689 Other specified bacterial agents as the cause of diseases classified elsewhere: Secondary | ICD-10-CM | POA: Diagnosis present

## 2019-07-31 DIAGNOSIS — X58XXXA Exposure to other specified factors, initial encounter: Secondary | ICD-10-CM | POA: Diagnosis present

## 2019-07-31 DIAGNOSIS — K3184 Gastroparesis: Secondary | ICD-10-CM | POA: Diagnosis present

## 2019-07-31 DIAGNOSIS — E876 Hypokalemia: Secondary | ICD-10-CM | POA: Diagnosis present

## 2019-07-31 DIAGNOSIS — J449 Chronic obstructive pulmonary disease, unspecified: Secondary | ICD-10-CM | POA: Diagnosis present

## 2019-07-31 DIAGNOSIS — M069 Rheumatoid arthritis, unspecified: Secondary | ICD-10-CM | POA: Diagnosis present

## 2019-07-31 DIAGNOSIS — Z8249 Family history of ischemic heart disease and other diseases of the circulatory system: Secondary | ICD-10-CM

## 2019-07-31 DIAGNOSIS — E860 Dehydration: Secondary | ICD-10-CM | POA: Diagnosis not present

## 2019-07-31 DIAGNOSIS — T380X5A Adverse effect of glucocorticoids and synthetic analogues, initial encounter: Secondary | ICD-10-CM | POA: Diagnosis present

## 2019-07-31 DIAGNOSIS — K219 Gastro-esophageal reflux disease without esophagitis: Secondary | ICD-10-CM | POA: Diagnosis present

## 2019-07-31 LAB — CBC WITH DIFFERENTIAL/PLATELET
Abs Immature Granulocytes: 0.04 10*3/uL (ref 0.00–0.07)
Basophils Absolute: 0 10*3/uL (ref 0.0–0.1)
Basophils Relative: 0 %
Eosinophils Absolute: 0 10*3/uL (ref 0.0–0.5)
Eosinophils Relative: 0 %
HCT: 36 % (ref 36.0–46.0)
Hemoglobin: 11.7 g/dL — ABNORMAL LOW (ref 12.0–15.0)
Immature Granulocytes: 0 %
Lymphocytes Relative: 19 %
Lymphs Abs: 2.3 10*3/uL (ref 0.7–4.0)
MCH: 28.9 pg (ref 26.0–34.0)
MCHC: 32.5 g/dL (ref 30.0–36.0)
MCV: 88.9 fL (ref 80.0–100.0)
Monocytes Absolute: 1.2 10*3/uL — ABNORMAL HIGH (ref 0.1–1.0)
Monocytes Relative: 10 %
Neutro Abs: 8.8 10*3/uL — ABNORMAL HIGH (ref 1.7–7.7)
Neutrophils Relative %: 71 %
Platelets: 392 10*3/uL (ref 150–400)
RBC: 4.05 MIL/uL (ref 3.87–5.11)
RDW: 14.7 % (ref 11.5–15.5)
WBC: 12.4 10*3/uL — ABNORMAL HIGH (ref 4.0–10.5)
nRBC: 0 % (ref 0.0–0.2)

## 2019-07-31 LAB — COMPREHENSIVE METABOLIC PANEL
ALT: 17 U/L (ref 0–44)
AST: 25 U/L (ref 15–41)
Albumin: 3.3 g/dL — ABNORMAL LOW (ref 3.5–5.0)
Alkaline Phosphatase: 39 U/L (ref 38–126)
Anion gap: 18 — ABNORMAL HIGH (ref 5–15)
BUN: 5 mg/dL — ABNORMAL LOW (ref 8–23)
CO2: 24 mmol/L (ref 22–32)
Calcium: 8.7 mg/dL — ABNORMAL LOW (ref 8.9–10.3)
Chloride: 89 mmol/L — ABNORMAL LOW (ref 98–111)
Creatinine, Ser: 1.15 mg/dL — ABNORMAL HIGH (ref 0.44–1.00)
GFR calc Af Amer: 52 mL/min — ABNORMAL LOW (ref >60)
GFR calc non Af Amer: 45 mL/min — ABNORMAL LOW (ref >60)
Glucose, Bld: 116 mg/dL — ABNORMAL HIGH (ref 70–99)
Potassium: 2.5 mmol/L — CL (ref 3.5–5.1)
Sodium: 131 mmol/L — ABNORMAL LOW (ref 135–145)
Total Bilirubin: 1.5 mg/dL — ABNORMAL HIGH (ref 0.3–1.2)
Total Protein: 6.5 g/dL (ref 6.5–8.1)

## 2019-07-31 LAB — URINALYSIS, ROUTINE W REFLEX MICROSCOPIC
Bilirubin Urine: NEGATIVE
Glucose, UA: NEGATIVE mg/dL
Ketones, ur: 5 mg/dL — AB
Nitrite: NEGATIVE
Protein, ur: NEGATIVE mg/dL
Specific Gravity, Urine: 1.002 — ABNORMAL LOW (ref 1.005–1.030)
pH: 7 (ref 5.0–8.0)

## 2019-07-31 LAB — LACTIC ACID, PLASMA: Lactic Acid, Venous: 1.6 mmol/L (ref 0.5–1.9)

## 2019-07-31 LAB — GLUCOSE, CAPILLARY
Glucose-Capillary: 68 mg/dL — ABNORMAL LOW (ref 70–99)
Glucose-Capillary: 96 mg/dL (ref 70–99)

## 2019-07-31 LAB — SARS CORONAVIRUS 2 (TAT 6-24 HRS): SARS Coronavirus 2: NEGATIVE

## 2019-07-31 LAB — LIPASE, BLOOD: Lipase: 40 U/L (ref 11–51)

## 2019-07-31 LAB — MAGNESIUM: Magnesium: 1 mg/dL — ABNORMAL LOW (ref 1.7–2.4)

## 2019-07-31 MED ORDER — METOCLOPRAMIDE HCL 10 MG PO TABS: 5.0000 mg | ORAL_TABLET | Freq: Three times a day (TID) | ORAL | Status: DC

## 2019-07-31 MED ORDER — MAGNESIUM SULFATE 2 GM/50ML IV SOLN
2.0000 g | Freq: Once | INTRAVENOUS | Status: AC
  Administered 2019-07-31: 2 g via INTRAVENOUS
  Filled 2019-07-31: qty 50

## 2019-07-31 MED ORDER — SODIUM CHLORIDE 0.9 % IV SOLN
1.0000 g | INTRAVENOUS | Status: AC
  Administered 2019-08-01 – 2019-08-02 (×2): 1 g via INTRAVENOUS
  Filled 2019-07-31 (×2): qty 10

## 2019-07-31 MED ORDER — TRAMADOL HCL 50 MG PO TABS
50.0000 mg | ORAL_TABLET | Freq: Four times a day (QID) | ORAL | Status: DC | PRN
  Administered 2019-07-31 – 2019-08-02 (×5): 50 mg via ORAL
  Filled 2019-07-31 (×5): qty 1

## 2019-07-31 MED ORDER — HYDROXYCHLOROQUINE SULFATE 200 MG PO TABS
200.0000 mg | ORAL_TABLET | Freq: Two times a day (BID) | ORAL | Status: DC
  Administered 2019-07-31 – 2019-08-03 (×6): 200 mg via ORAL
  Filled 2019-07-31 (×7): qty 1

## 2019-07-31 MED ORDER — HYDROXYZINE HCL 10 MG PO TABS
10.0000 mg | ORAL_TABLET | Freq: Three times a day (TID) | ORAL | Status: DC | PRN
  Administered 2019-08-01 – 2019-08-03 (×3): 10 mg via ORAL
  Filled 2019-07-31 (×5): qty 1

## 2019-07-31 MED ORDER — SODIUM CHLORIDE 0.9 % IV SOLN
1.0000 g | Freq: Once | INTRAVENOUS | Status: AC
  Administered 2019-07-31: 1 g via INTRAVENOUS
  Filled 2019-07-31: qty 10

## 2019-07-31 MED ORDER — ATORVASTATIN CALCIUM 10 MG PO TABS
10.0000 mg | ORAL_TABLET | Freq: Every day | ORAL | Status: DC
  Administered 2019-07-31 – 2019-08-02 (×3): 10 mg via ORAL
  Filled 2019-07-31 (×3): qty 1

## 2019-07-31 MED ORDER — POTASSIUM CHLORIDE IN NACL 40-0.9 MEQ/L-% IV SOLN
INTRAVENOUS | Status: DC
  Administered 2019-07-31: 100 mL/h via INTRAVENOUS
  Filled 2019-07-31 (×2): qty 1000

## 2019-07-31 MED ORDER — ACETAMINOPHEN 650 MG RE SUPP: 650.0000 mg | Freq: Four times a day (QID) | RECTAL | Status: DC | PRN

## 2019-07-31 MED ORDER — LORAZEPAM 2 MG/ML IJ SOLN
0.5000 mg | Freq: Once | INTRAMUSCULAR | Status: AC
  Administered 2019-07-31: 0.5 mg via INTRAVENOUS
  Filled 2019-07-31: qty 1

## 2019-07-31 MED ORDER — INSULIN ASPART 100 UNIT/ML ~~LOC~~ SOLN: 0.0000 [IU] | Freq: Every day | SUBCUTANEOUS | Status: DC

## 2019-07-31 MED ORDER — POTASSIUM CHLORIDE CRYS ER 20 MEQ PO TBCR
40.0000 meq | EXTENDED_RELEASE_TABLET | Freq: Once | ORAL | Status: AC
  Administered 2019-07-31: 10:00:00 40 meq via ORAL
  Filled 2019-07-31: qty 2

## 2019-07-31 MED ORDER — SODIUM CHLORIDE 0.9 % IV BOLUS
1000.0000 mL | Freq: Once | INTRAVENOUS | Status: AC
  Administered 2019-07-31: 1000 mL via INTRAVENOUS

## 2019-07-31 MED ORDER — POTASSIUM CHLORIDE 10 MEQ/100ML IV SOLN
10.0000 meq | Freq: Once | INTRAVENOUS | Status: AC
  Administered 2019-07-31: 10 meq via INTRAVENOUS
  Filled 2019-07-31: qty 100

## 2019-07-31 MED ORDER — ONDANSETRON 4 MG PO TBDP: 4.0000 mg | ORAL_TABLET | Freq: Three times a day (TID) | ORAL | Status: DC | PRN

## 2019-07-31 MED ORDER — METHYLPREDNISOLONE 4 MG PO TABS: 4.0000 mg | ORAL_TABLET | Freq: Every day | ORAL | Status: DC

## 2019-07-31 MED ORDER — SODIUM CHLORIDE 0.9% FLUSH
3.0000 mL | Freq: Two times a day (BID) | INTRAVENOUS | Status: DC
  Administered 2019-07-31 – 2019-08-02 (×2): 3 mL via INTRAVENOUS

## 2019-07-31 MED ORDER — ACETAMINOPHEN 500 MG PO TABS
1000.0000 mg | ORAL_TABLET | Freq: Once | ORAL | Status: AC
  Administered 2019-07-31: 1000 mg via ORAL
  Filled 2019-07-31: qty 2

## 2019-07-31 MED ORDER — ASPIRIN EC 81 MG PO TBEC
81.0000 mg | DELAYED_RELEASE_TABLET | Freq: Every day | ORAL | Status: DC
  Administered 2019-08-01 – 2019-08-03 (×3): 81 mg via ORAL
  Filled 2019-07-31 (×3): qty 1

## 2019-07-31 MED ORDER — MOMETASONE FURO-FORMOTEROL FUM 200-5 MCG/ACT IN AERO
2.0000 | INHALATION_SPRAY | Freq: Two times a day (BID) | RESPIRATORY_TRACT | Status: DC
  Administered 2019-07-31 – 2019-08-03 (×6): 2 via RESPIRATORY_TRACT
  Filled 2019-07-31: qty 8.8

## 2019-07-31 MED ORDER — INSULIN ASPART 100 UNIT/ML ~~LOC~~ SOLN
0.0000 [IU] | Freq: Three times a day (TID) | SUBCUTANEOUS | Status: DC
  Administered 2019-08-01 – 2019-08-02 (×4): 2 [IU] via SUBCUTANEOUS
  Administered 2019-08-02 (×2): 3 [IU] via SUBCUTANEOUS
  Administered 2019-08-03: 2 [IU] via SUBCUTANEOUS

## 2019-07-31 MED ORDER — ACETAMINOPHEN 325 MG PO TABS
650.0000 mg | ORAL_TABLET | Freq: Four times a day (QID) | ORAL | Status: DC | PRN
  Administered 2019-07-31: 650 mg via ORAL
  Filled 2019-07-31: qty 2

## 2019-07-31 MED ORDER — LISINOPRIL 10 MG PO TABS
10.0000 mg | ORAL_TABLET | Freq: Every day | ORAL | Status: DC
  Administered 2019-08-01 – 2019-08-03 (×3): 10 mg via ORAL
  Filled 2019-07-31 (×3): qty 1

## 2019-07-31 MED ORDER — DOCUSATE SODIUM 100 MG PO CAPS
100.0000 mg | ORAL_CAPSULE | Freq: Two times a day (BID) | ORAL | Status: DC
  Administered 2019-07-31 – 2019-08-03 (×4): 100 mg via ORAL
  Filled 2019-07-31 (×6): qty 1

## 2019-07-31 MED ORDER — MORPHINE SULFATE (PF) 4 MG/ML IV SOLN
4.0000 mg | Freq: Once | INTRAVENOUS | Status: AC
  Administered 2019-07-31: 4 mg via INTRAVENOUS
  Filled 2019-07-31: qty 1

## 2019-07-31 MED ORDER — MAGNESIUM OXIDE 400 (241.3 MG) MG PO TABS
400.0000 mg | ORAL_TABLET | Freq: Every day | ORAL | Status: DC
  Administered 2019-08-01: 400 mg via ORAL
  Filled 2019-07-31: qty 1

## 2019-07-31 MED ORDER — METHYLPREDNISOLONE 4 MG PO TABS
4.0000 mg | ORAL_TABLET | Freq: Every day | ORAL | Status: DC
  Administered 2019-07-31 – 2019-08-03 (×4): 4 mg via ORAL
  Filled 2019-07-31 (×4): qty 1

## 2019-07-31 MED ORDER — ENOXAPARIN SODIUM 40 MG/0.4ML ~~LOC~~ SOLN
40.0000 mg | SUBCUTANEOUS | Status: DC
  Administered 2019-07-31 – 2019-08-02 (×3): 40 mg via SUBCUTANEOUS
  Filled 2019-07-31 (×3): qty 0.4

## 2019-07-31 MED ORDER — SUCRALFATE 1 GM/10ML PO SUSP
1.0000 g | Freq: Three times a day (TID) | ORAL | Status: DC
  Administered 2019-07-31 – 2019-08-03 (×11): 1 g via ORAL
  Filled 2019-07-31 (×14): qty 10

## 2019-07-31 MED ORDER — DICYCLOMINE HCL 20 MG PO TABS
20.0000 mg | ORAL_TABLET | Freq: Two times a day (BID) | ORAL | Status: DC
  Administered 2019-07-31 – 2019-08-03 (×6): 20 mg via ORAL
  Filled 2019-07-31 (×7): qty 1

## 2019-07-31 MED ORDER — PANTOPRAZOLE SODIUM 40 MG PO TBEC
40.0000 mg | DELAYED_RELEASE_TABLET | Freq: Every day | ORAL | Status: DC
  Administered 2019-08-01 – 2019-08-03 (×3): 40 mg via ORAL
  Filled 2019-07-31 (×3): qty 1

## 2019-07-31 NOTE — ED Notes (Signed)
Nephew called for an update [facility] [phone]

## 2019-07-31 NOTE — ED Notes (Signed)
Updated pt's nephew [phone]),

## 2019-07-31 NOTE — ED Provider Notes (Signed)
[facility] EMERGENCY DEPARTMENT Provider Note   CSN: [id number] Arrival date & time: 07/31/19  [id number]     History Chief Complaint  Patient presents with  . Urinary Retention    [name] is a 84 y.o. female.  HPI   84 y/o F with a h/o anemia, CHF, chronic steroid use, COPD, hypertension, frequent UTI, GERD, hyperlipidemia, who presents the emergency department today due to complaints of difficulty urinating.  States that for the last week she has felt like she is not fully emptying her bladder.  She denies overt urinary retention and states that the last time she urinated was earlier this morning.  She has had urinary urgency, frequency and some dysuria.  She has also had some suprapubic discomfort when she urinates.  She has had no hematuria.  She has had no fevers or chills.  She has had some nausea and vomiting which she states she had because she drank too much water.  Denies any abdominal pain other than suprapubic pain.  No chest pain, increased shortness of breath or cough. Has been drinking water, but has not been eating much food recently.   She does not think that she has been taking antibiotics for UTI.  On record review, patient has had issues with persistent nausea/vomiting.  She had recent admission earlier this month where she was evaluated by GI and had an EGD.  There was concern for possible gastroparesis causing her symptoms.  Additionally, patient was seen by PCP following hospital visit and it was noted in the chart that she had not started antibiotics that were started for UTI upon her discharge from the hospital.  Past Medical History:  Diagnosis Date  . Allergy   . Anemia    "when I was young"  . Anginal pain ([place])   . Anxiety   . Arthritis    "all over my body"   . Chest pain 04/07/2014  . Chronic diastolic heart failure, NYHA class 1 ([place]) 01/22/2015  . Chronic lower back pain   . Chronic steroid use 12/11/2012  . COPD (chronic  obstructive pulmonary disease) ([place]) 01/09/2017  . 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 Elevated lactic acid level 01/22/2015  . Essential hypertension 06/15/2018  . Frequent UTI    "recently" (06/14/2015)  . Gastric mass   . GERD (gastroesophageal reflux disease)   . Headache(784.0)    "weekly; wake up w/them " (110/19/2018)  . Heart murmur   . High cholesterol   . History of blood transfusion ~ 1954   "related to my periods"  . Migraine    "once/month maybe" (05/16/2017)  . Multiple falls 06/14/2015  . Noncompliance 12/11/2012  . OSA (obstructive sleep apnea)    "I think I'm using a BiPAP" (06/14/2015)  . Sepsis ([place]) 01/09/2017  . Syncope and collapse 12/11/2012  . Tachycardia with heart rate 100-120 beats per minute 04/08/2014  . Type II diabetes mellitus ([place])   . Uncontrolled type 2 diabetes mellitus with complication [facility])     Patient Active Problem List   Diagnosis Date Noted  . Unexplained weight loss 07/12/2019  . Non-intractable vomiting   . Nausea, vomiting, and diarrhea 05/30/2019  . Hypokalemia 05/30/2019  . Hypomagnesemia 05/30/2019  . Gastritis 05/30/2019  . Elevated lipase 05/30/2019  . SIRS (systemic inflammatory response syndrome) ([place]) 05/30/2019  . History of COPD   . Other chronic pain 04/22/2019  . Dyspnea 08/10/2018  . Acute diastolic heart failure ([place]) 06/15/2018  .  Essential hypertension 06/15/2018  . 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 Gastric mass   . Sepsis secondary to UTI ([place])   . Uncontrolled type 2 diabetes mellitus with complication ([place])   . Acute renal failure ([place])   . Chronic diastolic CHF (congestive heart failure) ([place])   . E-coli UTI 01/09/2017  . Sepsis ([place]) 01/09/2017  . Constipation 01/09/2017  . COPD (chronic obstructive pulmonary disease) ([place]) 01/09/2017  . Multiple falls 06/14/2015  . Weight loss 06/14/2015  . Acute respiratory failure with hypoxia ([place]) 06/14/2015  . Orthostasis  06/14/2015  . Acute respiratory failure ([place]) 06/14/2015  . Elevated lactic acid level 01/22/2015  . 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 Chronic diastolic heart failure, NYHA class 1 ([place]) 01/22/2015  . Tachycardia with heart rate 100-120 beats per minute 04/08/2014  . DM (diabetes mellitus) ([place]) 04/08/2014  . Chest pain 04/07/2014  . Hyperglycemia 12/11/2012  . Syncope and collapse 12/11/2012  . Dehydration, mild 12/11/2012  . Noncompliance 12/11/2012  . UTI (lower urinary tract infection) 12/11/2012  . 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 Rheumatoid arthritis ([place]) 12/11/2012    Past Surgical History:  Procedure Laterality Date  . APPENDECTOMY  1970   Archie Endo 12/12/2010  . BACK SURGERY    . BIOPSY  05/25/2019   Procedure: BIOPSY;  Surgeon: Rush Landmark Telford Nab., MD;  Location: [facility];  Service: Gastroenterology;;  . [place]   Archie Endo 12/12/2010  . ESOPHAGOGASTRODUODENOSCOPY N/A 05/25/2019   Procedure: ESOPHAGOGASTRODUODENOSCOPY (EGD);  Surgeon: Irving Copas., MD;  Location: [facility];  Service: Gastroenterology;  Laterality: N/A;  . EUS N/A 10/16/2017   Procedure: UPPER ENDOSCOPIC ULTRASOUND (EUS) RADIAL;  Surgeon: Milus Banister, MD;  Location: [facility];  Service: Endoscopy;  Laterality: N/A;  . FRACTURE SURGERY    . LAPAROSCOPIC CHOLECYSTECTOMY  1990's  . LUMBAR MICRODISCECTOMY Left 09/2006   L5-S1/notes 12/12/2010  . MULTIPLE EXTRACTIONS WITH ALVEOLOPLASTY Bilateral 06/13/2017   Procedure: MULTIPLE EXTRACTION WITH ALVEOLOPLASTY;  Surgeon: Diona Browner, DDS;  Location: [facility];  Service: Oral Surgery;  Laterality: Bilateral;  . MULTIPLE TOOTH EXTRACTIONS    . ORIF SHOULDER FRACTURE Left ~ 2009   "put cement down in it"   . TONSILLECTOMY  1950's?  . TOTAL ABDOMINAL HYSTERECTOMY  1979   Archie Endo 12/12/2010     OB History   No obstetric history on file.     Family History  Problem Relation Age of  Onset  . Hypertension Mother   . Heart attack Father   . Diabetes Mellitus II Sister   . Hypertension Sister   . Cancer - Other Sister   . Migraines Niece   . Colon cancer Neg Hx     Social History   Tobacco Use  . Smoking status: Former Smoker    Types: Cigarettes  . Smokeless tobacco: Never Used  . Tobacco comment: 05/16/2017"1 pack of cigarettes would last 3-4 months; haven't had any cigarettes for 40 years"  Substance Use Topics  . Alcohol use: Not Currently    Comment: 05/16/2017 "used to drink beer in the 1990's; don't drink at all now"  . Drug use: No    Home Medications Prior to Admission medications   Medication Sig Start Date End Date Taking? Authorizing Provider  albuterol (ACCUNEB) 0.63 MG/3ML nebulizer solution Take 2 ampules by nebulization every 6 (six) hours as needed for wheezing.    [provider]  aspirin EC 81 MG tablet Take 1 tablet (81 mg total) by  mouth daily. Patient taking differently: Take 81 mg by mouth every Monday, Wednesday, and Friday.  04/09/14   Debbe Odea, MD  atorvastatin (LIPITOR) 10 MG tablet Take 1 Tablet by mouth daily Patient taking differently: Take 10 mg by mouth daily at 6 PM.  07/07/19   Rodriguez-Southworth, Sunday Spillers, PA-C  dicyclomine (BENTYL) 20 MG tablet Take 20 mg by mouth 2 (two) times daily. 10/13/17   [provider]  glucose blood (ONETOUCH VERIO) test strip Use to check blood sugar 3 to 4 times a day before meals 03/25/19   Rodriguez-Southworth, Sunday Spillers, PA-C  hydroxychloroquine (PLAQUENIL) 200 MG tablet Take 200 mg by mouth 2 (two) times daily.    [provider]  hydrOXYzine (ATARAX/VISTARIL) 10 MG tablet 1 q 8h prn itching Patient taking differently: Take 10 mg by mouth every 8 (eight) hours as needed for itching.  04/22/19   Rodriguez-Southworth, Sunday Spillers, PA-C  lisinopril-hydrochlorothiazide (ZESTORETIC) 10-12.5 MG tablet Take 1 tablet by mouth daily. 07/07/19   [provider]    MAGNESIUM-OXIDE 400 (241.3 Mg) MG tablet Take 1 tablet by mouth daily. 06/11/19   [provider]  methylPREDNISolone (MEDROL) 4 MG tablet Take 4 mg by mouth daily.    Rosita Kea, PA-C  metoCLOPramide (REGLAN) 5 MG tablet Take 5 mg oral 3 times daily before meals x5 days, then change 3 times daily before meals as needed. 07/14/19   Elgergawy, Silver Huguenin, MD  ondansetron (ZOFRAN ODT) 4 MG disintegrating tablet Take 1 tablet (4 mg total) by mouth every 8 (eight) hours as needed for nausea or vomiting. 06/10/19   Rodriguez-Southworth, Sunday Spillers, PA-C  pantoprazole (PROTONIX) 40 MG tablet Take 1 tablet (40 mg total) by mouth 2 (two) times daily. Take 1 tablet, 40 mg, by mouth 2 times daily x6 weeks then 1 tablet, 40 mg once daily indefinitely Patient taking differently: Take 40 mg by mouth daily.  07/06/19 09/04/19  Darliss Cheney, MD  promethazine (PHENERGAN) 25 MG tablet Take 1 tablet (25 mg total) by mouth every 6 (six) hours as needed for nausea or vomiting. 06/17/19   Rodriguez-Southworth, Sunday Spillers, PA-C  simethicone (GAS-X EXTRA STRENGTH) 125 MG chewable tablet Chew 125 mg by mouth every 6 (six) hours as needed for flatulence.    [provider]  sucralfate (CARAFATE) 1 GM/10ML suspension Take 10 mLs (1 g total) by mouth 4 (four) times daily -  with meals and at bedtime. 05/26/19   Kayleen Memos, DO  sulfamethoxazole-trimethoprim (BACTRIM DS) 800-160 MG tablet Take 1 tablet by mouth 2 (two) times daily. 07/15/19   Elgergawy, Silver Huguenin, MD  traMADol (ULTRAM) 50 MG tablet Take 1 tablet (50 mg total) by mouth every 6 (six) hours as needed. 07/15/19   Rodriguez-Southworth, Sunday Spillers, PA-C    Allergies    Patient has no known allergies.  Review of Systems   Review of Systems  Constitutional: Negative for chills and fever.  HENT: Negative for sore throat.   Eyes: Negative for pain and visual disturbance.  Respiratory: Positive for shortness of breath (chronic, unchanged). Negative for cough.    Cardiovascular: Negative for chest pain.  Gastrointestinal: Positive for abdominal pain (suprapubic), nausea and vomiting. Negative for constipation and diarrhea.  Genitourinary: Positive for dysuria, frequency and urgency. Negative for flank pain and hematuria.  Musculoskeletal: Negative for back pain.  Skin: Negative for rash.  Neurological: Negative for headaches.  All other systems reviewed and are negative.   Physical Exam Updated Vital Signs BP (!) 152/91 (BP Location: Right  Arm)   Pulse 99   Temp 98.8 F (37.1 C) (Rectal)   Resp 16   Wt 55 kg   SpO2 100%   BMI 22.18 kg/m   Physical Exam Vitals and nursing note reviewed.  Constitutional:      General: She is not in acute distress.    Appearance: She is well-developed. She is not ill-appearing or toxic-appearing.  HENT:     Head: Normocephalic and atraumatic.     Mouth/Throat:     Mouth: Mucous membranes are dry.  Eyes:     Conjunctiva/sclera: Conjunctivae normal.  Cardiovascular:     Rate and Rhythm: Regular rhythm. Tachycardia present.     Heart sounds: Normal heart sounds.  Pulmonary:     Effort: Pulmonary effort is normal. No respiratory distress.     Breath sounds: Normal breath sounds. No wheezing, rhonchi or rales.  Abdominal:     General: Bowel sounds are normal.     Palpations: Abdomen is soft.     Comments: Reports diffuse ttp, worse to suprapubic area. No rebound TTP or guarding.  Musculoskeletal:     Cervical back: Neck supple.     Right lower leg: No edema.     Left lower leg: No edema.  Skin:    General: Skin is warm and dry.  Neurological:     Mental Status: She is alert.     Comments: Clear speech, moving all extremities     ED Results / Procedures / Treatments   Labs (all labs ordered are listed, but only abnormal results are displayed) Labs Reviewed  CBC WITH DIFFERENTIAL/PLATELET - Abnormal; Notable for the following components:      Result Value   WBC 12.4 (*)    Hemoglobin 11.7  (*)    Neutro Abs 8.8 (*)    Monocytes Absolute 1.2 (*)    All other components within normal limits  COMPREHENSIVE METABOLIC PANEL - Abnormal; Notable for the following components:   Sodium 131 (*)    Potassium 2.5 (*)    Chloride 89 (*)    Glucose, Bld 116 (*)    BUN 5 (*)    Creatinine, Ser 1.15 (*)    Calcium 8.7 (*)    Albumin 3.3 (*)    Total Bilirubin 1.5 (*)    GFR calc non Af Amer 45 (*)    GFR calc Af Amer 52 (*)    Anion gap 18 (*)    All other components within normal limits  URINALYSIS, ROUTINE W REFLEX MICROSCOPIC - Abnormal; Notable for the following components:   Color, Urine STRAW (*)    Specific Gravity, Urine 1.002 (*)    Hgb urine dipstick LARGE (*)    Ketones, ur 5 (*)    Leukocytes,Ua LARGE (*)    Bacteria, UA RARE (*)    All other components within normal limits  MAGNESIUM - Abnormal; Notable for the following components:   Magnesium 1.0 (*)    All other components within normal limits  URINE CULTURE  CULTURE, BLOOD (ROUTINE X 2)  CULTURE, BLOOD (ROUTINE X 2)  LIPASE, BLOOD  LACTIC ACID, PLASMA  LACTIC ACID, PLASMA    EKG EKG Interpretation  Date/Time:  Saturday July 31 2019 09:43:43 EST Ventricular Rate:  105 PR Interval:    QRS Duration: 82 QT Interval:  387 QTC Calculation: 512 R Axis:   -53 Text Interpretation: Sinus tachycardia Atrial premature complex Abnormal R-wave progression, late transition Inferior infarct, old Prolonged QT interval Confirmed by Jeanell Sparrow,  Andee Poles [phone]) on 07/31/2019 12:11:31 PM   Radiology No results found.  Procedures Procedures (including critical care time) CRITICAL CARE Performed by: Rodney Booze   Total critical care time: 35 minutes  Critical care time was exclusive of separately billable procedures and treating other patients.  Critical care was necessary to treat or prevent imminent or life-threatening deterioration.  Critical care was time spent personally by me on the following  activities: development of treatment plan with patient and/or surrogate as well as nursing, discussions with consultants, evaluation of patient's response to treatment, examination of patient, obtaining history from patient or surrogate, ordering and performing treatments and interventions, ordering and review of laboratory studies, ordering and review of radiographic studies, pulse oximetry and re-evaluation of patient's condition.   Medications Ordered in ED Medications  cefTRIAXone (ROCEPHIN) 1 g in sodium chloride 0.9 % 100 mL IVPB (1 g Intravenous New Bag/Given 07/31/19 1307)  magnesium sulfate IVPB 2 g 50 mL (2 g Intravenous New Bag/Given 07/31/19 1308)  sodium chloride 0.9 % bolus 1,000 mL (0 mLs Intravenous Stopped 07/31/19 1301)  acetaminophen (TYLENOL) tablet 1,000 mg (1,000 mg Oral Given 07/31/19 0855)  potassium chloride SA (KLOR-CON) CR tablet 40 mEq (40 mEq Oral Given 07/31/19 0946)  potassium chloride 10 mEq in 100 mL IVPB (0 mEq Intravenous Stopped 07/31/19 1115)  LORazepam (ATIVAN) injection 0.5 mg (0.5 mg Intravenous Given 07/31/19 1209)  morphine 4 MG/ML injection 4 mg (4 mg Intravenous Given 07/31/19 1212)    ED Course  I have reviewed the triage vital signs and the nursing notes.  Pertinent labs & imaging results that were available during my care of the patient were reviewed by me and considered in my medical decision making (see chart for details).    MDM Rules/Calculators/A&P                      84 year old female presenting for evaluation of incomplete bladder emptying for the last week.  Has had urinary frequency and some dysuria as well.  Has not had any fevers.  She has had nausea and vomiting but does have history of gastroparesis.  On arrival patient tachycardic (appears to be baseline), she is somewhat hypertensive.  She is afebrile with normal respiratory status.  Bladder scan with 300cc (prior to urination). 250cc in bladder post void.  CBC with mild leukocytosis, mild  anemia but improving CMP with hypokalemia at 2.5, mild hyponatremia and hypochloremia, mild AKI with Cr 1.15, normal LFTs, AG elevated at 18  - K supplemented with PO and IV potassium Lipase wnl UA with hematuria, large leukocytes, 0-5 RBC, 21-50 WBC, rare bacteria. Suspect UTI, Culture added.  - Tx with ceftriaxone  Lactic acid is negative therefore have low suspicion for sepsis.  Blood cultures were obtained   EKG with Sinus tachycardia Atrial premature complex Abnormal R-wave progression, late transition Inferior infarct, old Prolonged QT interval   Patient with UTI, history of persistent nausea and vomiting that has continued today in the ED.  She does have hypokalemia likely secondary to decreased p.o. intake and persistent nausea vomiting at home.  I doubt emergent intra-abdominal cause and think that this is likely secondary to her chronic symptoms however with her hypokalemia with EKG changes and inability to tolerate p.o. I think that she needs to come in for further treatment to replete her potassium and treat her for UTI.  1:36 PM CONSULT with Dr. Lorin Mercy who accepts patient for admission    Final Clinical  Impression(s) / ED Diagnoses Final diagnoses:  None    Rx / DC Orders ED Discharge Orders    None       Bishop Dublin 07/31/19 1336    Pattricia Boss, MD 08/05/19 1329

## 2019-07-31 NOTE — ED Triage Notes (Signed)
Pt transported from home by EMS for c/o urinary retention x 1 week. VSS

## 2019-07-31 NOTE — H&P (Addendum)
History and Physical    [name] [id number] DOB: 1935-11-22 DOA: 07/31/2019  PCP: [name], PA-C Consultants:  Havery Moros - GI; [place] - pulmonology; Johnsie Cancel - cardiology Patient coming from:  Home - lives with nephew, niece visits regularly; NOK: Niece, [phone]  Chief Complaint: urinary symptoms  HPI: [name] is a 84 y.o. female with medical history significant of DM; OSA on BIPAP; HTN; HLD; COPD; RA on chronic immunosuppression; and chronic diastolic CHF presenting with urinary symptoms in the setting of chronic n/v.  She reports ongoing significant urinary symptoms with difficulty voiding, dysuria, and pelvic discomfort.  She reports that her chronic n/v has actually been better with SL Zofran - although she is not sure if she has been taking Carafate, Protonix, or Reglan.    Her last hospitalization was from 12/14-16 with n/v from gastroparesis, discharged on Reglan and Protonix with outpatient GI f/u.  UTI noted and treated with Rocephin x 2 days and Bactrim x 1 day.  She had also been hospitalized from 10/31-11/2 and before that 10/21-28.   She had a GI consult with EGD that showed chronic gastritis; negative urine culture, treated with 3 days of Cefepime; and AKI that resolved with IVF (creatinine 4.91 -> 0.8).  ED Course:  Presented with urinary symptoms, likely recurrent UTI.  Also with chronic n/v.  K+ 2.5 with prolonged QTc.  Needs further treatment for hypokalemia.  Review of Systems: As per HPI; otherwise review of systems reviewed and negative.   Ambulatory Status:  Ambulates without assistance  Past Medical History:  Diagnosis Date  . Allergy   . Anemia    "when I was young"  . Anginal pain ([place])   . Anxiety   . Arthritis    "all over my body"   . Chest pain 04/07/2014  . Chronic diastolic heart failure, NYHA class 1 ([place]) 01/22/2015  . Chronic lower back pain   . Chronic steroid use 12/11/2012  . COPD (chronic obstructive pulmonary  disease) ([place]) 01/09/2017  . 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 Elevated lactic acid level 01/22/2015  . Essential hypertension 06/15/2018  . Frequent UTI    "recently" (06/14/2015)  . Gastric mass   . GERD (gastroesophageal reflux disease)   . Headache(784.0)    "weekly; wake up w/them " (110/19/2018)  . Heart murmur   . High cholesterol   . History of blood transfusion ~ 1954   "related to my periods"  . Migraine    "once/month maybe" (05/16/2017)  . Multiple falls 06/14/2015  . Noncompliance 12/11/2012  . OSA (obstructive sleep apnea)    "I think I'm using a BiPAP" (06/14/2015)  . Sepsis ([place]) 01/09/2017  . Syncope and collapse 12/11/2012  . Tachycardia with heart rate 100-120 beats per minute 04/08/2014  . Type II diabetes mellitus ([place])   . Uncontrolled type 2 diabetes mellitus with complication [facility])     Past Surgical History:  Procedure Laterality Date  . APPENDECTOMY  1970   Archie Endo 12/12/2010  . BACK SURGERY    . BIOPSY  05/25/2019   Procedure: BIOPSY;  Surgeon: Rush Landmark Telford Nab., MD;  Location: [place];  Service: Gastroenterology;;  . [place]   Archie Endo 12/12/2010  . ESOPHAGOGASTRODUODENOSCOPY N/A 05/25/2019   Procedure: ESOPHAGOGASTRODUODENOSCOPY (EGD);  Surgeon: Irving Copas., MD;  Location: [place];  Service: Gastroenterology;  Laterality: N/A;  . EUS N/A 10/16/2017   Procedure: UPPER ENDOSCOPIC ULTRASOUND (EUS) RADIAL;  Surgeon: Milus Banister, MD;  Location: [facility];  Service: Endoscopy;  Laterality: N/A;  . FRACTURE SURGERY    . LAPAROSCOPIC CHOLECYSTECTOMY  1990's  . LUMBAR MICRODISCECTOMY Left 09/2006   L5-S1/notes 12/12/2010  . MULTIPLE EXTRACTIONS WITH ALVEOLOPLASTY Bilateral 06/13/2017   Procedure: MULTIPLE EXTRACTION WITH ALVEOLOPLASTY;  Surgeon: Diona Browner, DDS;  Location: [place];  Service: Oral Surgery;  Laterality: Bilateral;  . MULTIPLE TOOTH EXTRACTIONS    . ORIF  SHOULDER FRACTURE Left ~ 2009   "put cement down in it"   . TONSILLECTOMY  1950's?  . TOTAL ABDOMINAL HYSTERECTOMY  1979   Archie Endo 12/12/2010    Social History   Socioeconomic History  . Marital status: Widowed    Spouse name: Not on file  . Number of children: Not on file  . Years of education: Not on file  . Highest education level: Not on file  Occupational History  . Occupation: retired  Tobacco Use  . Smoking status: Former Smoker    Types: Cigarettes  . Smokeless tobacco: Never Used  . Tobacco comment: 05/16/2017"1 pack of cigarettes would last 3-4 months; haven't had any cigarettes for 40 years"  Substance and Sexual Activity  . Alcohol use: Not Currently    Comment: 05/16/2017 "used to drink beer in the 1990's; don't drink at all now"  . Drug use: No  . Sexual activity: Not Currently    Birth control/protection: Abstinence  Other Topics Concern  . Not on file  Social History Narrative   Lives at home alone    Her niece comes to check on her everyday. Her son comes by every other day as well   Right handed   Caffeine: none   Social Determinants of Health   Financial Resource Strain: Low Risk   . Difficulty of Paying Living Expenses: Not hard at all  Food Insecurity: No Food Insecurity  . Worried About Charity fundraiser in the Last Year: Never true  . Ran Out of Food in the Last Year: Never true  Transportation Needs: No Transportation Needs  . Lack of Transportation (Medical): No  . Lack of Transportation (Non-Medical): No  Physical Activity: Inactive  . Days of Exercise per Week: 0 days  . Minutes of Exercise per Session: 0 min  Stress: No Stress Concern Present  . Feeling of Stress : Not at all  Social Connections:   . Frequency of Communication with Friends and Family: Not on file  . Frequency of Social Gatherings with Friends and Family: Not on file  . Attends Religious Services: Not on file  . Active Member of Clubs or Organizations: Not on file  .  Attends Archivist Meetings: Not on file  . Marital Status: Not on file  Intimate Partner Violence: Not At Risk  . Fear of Current or Ex-Partner: No  . Emotionally Abused: No  . Physically Abused: No  . Sexually Abused: No    No Known Allergies  Family History  Problem Relation Age of Onset  . Hypertension Mother   . Heart attack Father   . Diabetes Mellitus II Sister   . Hypertension Sister   . Cancer - Other Sister   . Migraines Niece   . Colon cancer Neg Hx     Prior to Admission medications   Medication Sig Start Date End Date Taking? Authorizing Provider  albuterol (ACCUNEB) 0.63 MG/3ML nebulizer solution Take 2 ampules by nebulization every 6 (six) hours as needed for wheezing.    [provider]  aspirin EC  81 MG tablet Take 1 tablet (81 mg total) by mouth daily. Patient taking differently: Take 81 mg by mouth every Monday, Wednesday, and Friday.  04/09/14   Debbe Odea, MD  atorvastatin (LIPITOR) 10 MG tablet Take 1 Tablet by mouth daily Patient taking differently: Take 10 mg by mouth daily at 6 PM.  07/07/19   Rodriguez-Southworth, Sunday Spillers, PA-C  dicyclomine (BENTYL) 20 MG tablet Take 20 mg by mouth 2 (two) times daily. 10/13/17   [provider]  glucose blood (ONETOUCH VERIO) test strip Use to check blood sugar 3 to 4 times a day before meals 03/25/19   Rodriguez-Southworth, Sunday Spillers, PA-C  hydroxychloroquine (PLAQUENIL) 200 MG tablet Take 200 mg by mouth 2 (two) times daily.    [provider]  hydrOXYzine (ATARAX/VISTARIL) 10 MG tablet 1 q 8h prn itching Patient taking differently: Take 10 mg by mouth every 8 (eight) hours as needed for itching.  04/22/19   Rodriguez-Southworth, Sunday Spillers, PA-C  lisinopril-hydrochlorothiazide (ZESTORETIC) 10-12.5 MG tablet Take 1 tablet by mouth daily. 07/07/19   [provider]  MAGNESIUM-OXIDE 400 (241.3 Mg) MG tablet Take 1 tablet by mouth daily. 06/11/19   [provider]   methylPREDNISolone (MEDROL) 4 MG tablet Take 4 mg by mouth daily.    Rosita Kea, PA-C  metoCLOPramide (REGLAN) 5 MG tablet Take 5 mg oral 3 times daily before meals x5 days, then change 3 times daily before meals as needed. 07/14/19   Elgergawy, Silver Huguenin, MD  ondansetron (ZOFRAN ODT) 4 MG disintegrating tablet Take 1 tablet (4 mg total) by mouth every 8 (eight) hours as needed for nausea or vomiting. 06/10/19   Rodriguez-Southworth, Sunday Spillers, PA-C  pantoprazole (PROTONIX) 40 MG tablet Take 1 tablet (40 mg total) by mouth 2 (two) times daily. Take 1 tablet, 40 mg, by mouth 2 times daily x6 weeks then 1 tablet, 40 mg once daily indefinitely Patient taking differently: Take 40 mg by mouth daily.  07/06/19 09/04/19  Darliss Cheney, MD  promethazine (PHENERGAN) 25 MG tablet Take 1 tablet (25 mg total) by mouth every 6 (six) hours as needed for nausea or vomiting. 06/17/19   Rodriguez-Southworth, Sunday Spillers, PA-C  simethicone (GAS-X EXTRA STRENGTH) 125 MG chewable tablet Chew 125 mg by mouth every 6 (six) hours as needed for flatulence.    [provider]  sucralfate (CARAFATE) 1 GM/10ML suspension Take 10 mLs (1 g total) by mouth 4 (four) times daily -  with meals and at bedtime. 05/26/19   Kayleen Memos, DO  sulfamethoxazole-trimethoprim (BACTRIM DS) 800-160 MG tablet Take 1 tablet by mouth 2 (two) times daily. 07/15/19   Elgergawy, Silver Huguenin, MD  traMADol (ULTRAM) 50 MG tablet Take 1 tablet (50 mg total) by mouth every 6 (six) hours as needed. 07/15/19   Rodriguez-SouthworthSunday Spillers, PA-C    Physical Exam: Vitals:   07/31/19 0949 07/31/19 1000 07/31/19 1200 07/31/19 1816  BP:  139/80 (!) 152/91 (!) 149/74  Pulse:  (!) 102 99 (!) 112  Resp:  16 16 16   Temp: 98.8 F (37.1 C)     TempSrc: Rectal     SpO2:  100% 100% 98%  Weight:         . General:  Appears calm and comfortable and is NAD, frail . Eyes:   EOMI, normal lids, iris . ENT:  grossly normal hearing, lips & tongue, mmm . Neck:  no  LAD, masses or thyromegaly . Cardiovascular:  RRR, no m/r/g. No LE edema.  Marland Kitchen Respiratory:  CTA bilaterally with no wheezes/rales/rhonchi.  Normal respiratory effort. . Abdomen:  soft, suprapubic TTP, ND, NABS . Skin:  no rash or induration seen on limited exam . Musculoskeletal:  grossly normal tone BUE/BLE, good ROM, no bony abnormality; chronic hand deformities from RA . Psychiatric:  blunted mood and affect, speech fluent and appropriate, AOx3 . Neurologic:  CN 2-12 grossly intact, moves all extremities in coordinated fashion, sensation intact    Radiological Exams on Admission: No results found.  EKG: Independently reviewed.  Sinus tachycardia with rate 105; prolonged QTc 512; nonspecific ST changes with no evidence of acute ischemia   Labs on Admission: I have personally reviewed the available labs and imaging studies at the time of the admission.  Pertinent labs:   K+ 2.5 Glucose 116 BUN 5/Creatinine 1.15/GR 52; 2/0.84/65 on 12/17 Anion gap 18 Mag++ 1.0 Bili 1.5 WBC 12.4 Hgb 11.7 UA: large Hgb, 5 ketones, large LE, negative nitrite, rare bacteria; prior UA with trace LE, + nitrite, many bacteria Blood and urine cultures pending 12/15 urine culture + E. Coli, pansensitive  Assessment/Plan Principal Problem:   Urinary tract infection symptoms Active Problems:   Immunosuppression due to chronic steroid use   Rheumatoid arthritis (HCC)   DM (diabetes mellitus) (HCC)   Chronic diastolic heart failure, NYHA class 1 (HCC)   COPD (chronic obstructive pulmonary disease) (HCC)   AKI (acute kidney injury) ([place])   Essential hypertension   Hypokalemia   Hypomagnesemia   Diabetic gastroparesis (HCC)     Urinary symptoms -Patient previously with E-coli UTI, treated with Rocephin x 2 days and Bactrim x 1 day -She reports persistent symptoms including dysuria, inability to completely empty bladder, and suprapubic pain -UA is unremarkable - but this may be due to partially  treated UTI -Urine culture is pending -Will continue Rocephin for now -She is s/p hysterectomy and did not have any remarkable findings on pelvic CT in 10/20; ultrasound could be considered if urine culture is negative but symptoms continue  Gastroparesis, chronic N/V -Patient was previously admitted in last October and early November x 2; EGD with biopsies showed chronic gastritis -She was recently (12/14-16) admitted again and diagnosed with gastroparesis -She was started on Reglan; Protonix; and Carafate -It is not clear if she has been taking these medications, but she reports improved symptoms with ODT Zofran -She appears to be dehydrated with AKI, so the history is uncertain  Hypokalemia, hypomagnesemia with dehydration, mild AKI -Appears mildly dry and this is consistent with her history -Will gently rehydrate and replete K+ and Mag++ -Suggest discontinuation of HCTZ -She reports that the magnesium pill is too big - it appears that she is not taking this -Prolonged QT is thought to be related to electrolyte abnormality and should improve with treatment; recheck EKG in AM to ensure that this is the case  RA, on immunosuppression therapy -Continue Plaquenil and daily methylprednisolone for now  DM -Prior A1c was 7.9 in 10/20 - but she has had weight loss since then -Glucose appears to be generally controlled so no treatment for now  COPD -Continue prn Albuterol, Symbicort  HTN -Hold Zestoretic, consider d/c of HCTZ component -Continue Lisinopril, with plan for monitoring creatinine given mild AKI -Possibly appropriate to resume tomorrow  Chronic diastolic CHF -[id number] echo with impaired diastolic relaxation -Appears to be compensated at this time, will follow with IVF -Continue ASA  HLD -Continue Lipitor   Note: This patient has been tested and is pending for the novel coronavirus COVID-19; she  was previously negative on 12/14, 11/1, 10/21.  DVT prophylaxis:   Lovenox  Code Status: DNR - confirmed with patient Family Communication: None present  Disposition Plan:  Home once clinically improved Consults called: None  Admission status: It is my clinical opinion that referral for OBSERVATION is reasonable and necessary in this patient based on the above information provided. The aforementioned taken together are felt to place the patient at high risk for further clinical deterioration. However it is anticipated that the patient may be medically stable for discharge from the hospital within 24 to 48 hours.    Karmen Bongo MD [facility]   How to contact the [facility] Attending or Consulting provider [place] or covering provider during after hours [place], for this patient?  1. Check the care team in [facility] and look for a) attending/consulting [facility] provider listed and b) the [facility] team listed 2. Log into [email or web address] and use [facility]'s universal password to access. If you do not have the password, please contact the hospital operator. 3. Locate the [facility] provider you are looking for under [facility] and page to a number that you can be directly reached. 4. If you still have difficulty reaching the provider, please page the [facility] (Director on Call) for the Hospitalists listed on amion for assistance.   07/31/2019, 6:36 PM

## 2019-07-31 NOTE — ED Notes (Signed)
Gave a throw up bag to the hall way patient

## 2019-08-01 ENCOUNTER — Other Ambulatory Visit: Payer: Self-pay

## 2019-08-01 DIAGNOSIS — R399 Unspecified symptoms and signs involving the genitourinary system: Secondary | ICD-10-CM | POA: Diagnosis not present

## 2019-08-01 DIAGNOSIS — Z87891 Personal history of nicotine dependence: Secondary | ICD-10-CM | POA: Diagnosis not present

## 2019-08-01 DIAGNOSIS — E876 Hypokalemia: Secondary | ICD-10-CM | POA: Diagnosis present

## 2019-08-01 DIAGNOSIS — M069 Rheumatoid arthritis, unspecified: Secondary | ICD-10-CM | POA: Diagnosis present

## 2019-08-01 DIAGNOSIS — K219 Gastro-esophageal reflux disease without esophagitis: Secondary | ICD-10-CM | POA: Diagnosis present

## 2019-08-01 DIAGNOSIS — K3184 Gastroparesis: Secondary | ICD-10-CM | POA: Diagnosis present

## 2019-08-01 DIAGNOSIS — Z8249 Family history of ischemic heart disease and other diseases of the circulatory system: Secondary | ICD-10-CM | POA: Diagnosis not present

## 2019-08-01 DIAGNOSIS — Z66 Do not resuscitate: Secondary | ICD-10-CM | POA: Diagnosis present

## 2019-08-01 DIAGNOSIS — B9689 Other specified bacterial agents as the cause of diseases classified elsewhere: Secondary | ICD-10-CM | POA: Diagnosis present

## 2019-08-01 DIAGNOSIS — E860 Dehydration: Secondary | ICD-10-CM | POA: Diagnosis present

## 2019-08-01 DIAGNOSIS — Z833 Family history of diabetes mellitus: Secondary | ICD-10-CM | POA: Diagnosis not present

## 2019-08-01 DIAGNOSIS — I5032 Chronic diastolic (congestive) heart failure: Secondary | ICD-10-CM | POA: Diagnosis present

## 2019-08-01 DIAGNOSIS — X58XXXA Exposure to other specified factors, initial encounter: Secondary | ICD-10-CM | POA: Diagnosis present

## 2019-08-01 DIAGNOSIS — Z9049 Acquired absence of other specified parts of digestive tract: Secondary | ICD-10-CM | POA: Diagnosis not present

## 2019-08-01 DIAGNOSIS — E785 Hyperlipidemia, unspecified: Secondary | ICD-10-CM | POA: Diagnosis present

## 2019-08-01 DIAGNOSIS — I110 Hypertensive heart disease with heart failure: Secondary | ICD-10-CM | POA: Diagnosis present

## 2019-08-01 DIAGNOSIS — G4733 Obstructive sleep apnea (adult) (pediatric): Secondary | ICD-10-CM | POA: Diagnosis present

## 2019-08-01 DIAGNOSIS — Z90710 Acquired absence of both cervix and uterus: Secondary | ICD-10-CM | POA: Diagnosis not present

## 2019-08-01 DIAGNOSIS — E871 Hypo-osmolality and hyponatremia: Secondary | ICD-10-CM | POA: Diagnosis present

## 2019-08-01 DIAGNOSIS — Z20822 Contact with and (suspected) exposure to covid-19: Secondary | ICD-10-CM | POA: Diagnosis present

## 2019-08-01 DIAGNOSIS — Z7982 Long term (current) use of aspirin: Secondary | ICD-10-CM | POA: Diagnosis not present

## 2019-08-01 DIAGNOSIS — N179 Acute kidney failure, unspecified: Secondary | ICD-10-CM | POA: Diagnosis present

## 2019-08-01 DIAGNOSIS — N390 Urinary tract infection, site not specified: Secondary | ICD-10-CM | POA: Diagnosis present

## 2019-08-01 DIAGNOSIS — J449 Chronic obstructive pulmonary disease, unspecified: Secondary | ICD-10-CM | POA: Diagnosis present

## 2019-08-01 DIAGNOSIS — E1143 Type 2 diabetes mellitus with diabetic autonomic (poly)neuropathy: Secondary | ICD-10-CM | POA: Diagnosis present

## 2019-08-01 LAB — CBC
HCT: 31.3 % — ABNORMAL LOW (ref 36.0–46.0)
Hemoglobin: 10.1 g/dL — ABNORMAL LOW (ref 12.0–15.0)
MCH: 28.5 pg (ref 26.0–34.0)
MCHC: 32.3 g/dL (ref 30.0–36.0)
MCV: 88.4 fL (ref 80.0–100.0)
Platelets: 345 10*3/uL (ref 150–400)
RBC: 3.54 MIL/uL — ABNORMAL LOW (ref 3.87–5.11)
RDW: 15.3 % (ref 11.5–15.5)
WBC: 7.6 10*3/uL (ref 4.0–10.5)
nRBC: 0 % (ref 0.0–0.2)

## 2019-08-01 LAB — BASIC METABOLIC PANEL
Anion gap: 12 (ref 5–15)
BUN: <5 mg/dL — ABNORMAL LOW (ref 8–23)
CO2: 25 mmol/L (ref 22–32)
Calcium: 7.9 mg/dL — ABNORMAL LOW (ref 8.9–10.3)
Chloride: 101 mmol/L (ref 98–111)
Creatinine, Ser: 0.76 mg/dL (ref 0.44–1.00)
GFR calc Af Amer: >60 mL/min (ref >60)
GFR calc non Af Amer: >60 mL/min (ref >60)
Glucose, Bld: 128 mg/dL — ABNORMAL HIGH (ref 70–99)
Potassium: 2.9 mmol/L — ABNORMAL LOW (ref 3.5–5.1)
Sodium: 138 mmol/L (ref 135–145)

## 2019-08-01 LAB — MAGNESIUM: Magnesium: 1.3 mg/dL — ABNORMAL LOW (ref 1.7–2.4)

## 2019-08-01 LAB — GLUCOSE, CAPILLARY
Glucose-Capillary: 130 mg/dL — ABNORMAL HIGH (ref 70–99)
Glucose-Capillary: 125 mg/dL — ABNORMAL HIGH (ref 70–99)
Glucose-Capillary: 125 mg/dL — ABNORMAL HIGH (ref 70–99)
Glucose-Capillary: 113 mg/dL — ABNORMAL HIGH (ref 70–99)

## 2019-08-01 MED ORDER — MAGNESIUM SULFATE 2 GM/50ML IV SOLN
2.0000 g | Freq: Once | INTRAVENOUS | Status: AC
  Administered 2019-08-01: 2 g via INTRAVENOUS
  Filled 2019-08-01 (×2): qty 50

## 2019-08-01 MED ORDER — POTASSIUM CHLORIDE 20 MEQ PO PACK
40.0000 meq | PACK | Freq: Three times a day (TID) | ORAL | Status: AC
  Administered 2019-08-01 – 2019-08-02 (×3): 40 meq via ORAL
  Filled 2019-08-01 (×3): qty 2

## 2019-08-01 NOTE — Plan of Care (Signed)

## 2019-08-01 NOTE — Progress Notes (Signed)
Progress Note    [name]  [id number] DOB: 10/23/1935  DOA: 07/31/2019 PCP: [name], PA-C    Brief Narrative:     Medical records reviewed and are as summarized below:  [name] is an 84 y.o. female with medical history significant of DM; OSA on BIPAP; HTN; HLD; COPD; RA on chronic immunosuppression; and chronic diastolic CHF presenting with urinary symptoms in the setting of chronic n/v.  She reports ongoing significant urinary symptoms with difficulty voiding, dysuria, and pelvic discomfort.  She reports that her chronic n/v has actually been better with SL Zofran - although she is not sure if she has been taking Carafate, Protonix, or Reglan.    Her last hospitalization was from 12/14-16 with n/v from gastroparesis, discharged on Reglan and Protonix with outpatient GI f/u.  UTI noted and treated with Rocephin x 2 days and Bactrim x 1 day.  She had also been hospitalized from 10/31-11/2 and before that 10/21-28. She had a GI consult with EGD that showed chronic gastritis; negative urine culture, treated with 3 days of Cefepime; and AKI that resolved with IVF (creatinine 4.91 ->0.8).  Assessment/Plan:   Principal Problem:   Urinary tract infection symptoms Active Problems:   Immunosuppression due to chronic steroid use   Rheumatoid arthritis (HCC)   DM (diabetes mellitus) (HCC)   Chronic diastolic heart failure, NYHA class 1 (HCC)   COPD (chronic obstructive pulmonary disease) (HCC)   AKI (acute kidney injury) ([place])   Essential hypertension   Hypokalemia   Hypomagnesemia   Diabetic gastroparesis (HCC)   Dehydration   UTI- gram negative -Patient previously with E-coli UTI, treated with Rocephin x 2 days and Bactrim x 1 day -She reports persistent symptoms including dysuria, inability to completely empty bladder, and suprapubic pain -UA is unremarkable - but this may be due to partially treated UTI -Urine culture is >100,000 gram  negative rods-- continue IV rocephin  Gastroparesis, chronic N/V -Patient was previously admitted in last October and early November x 2; EGD with biopsies showed chronic gastritis -She was recently (12/14-16) admitted again and diagnosed with gastroparesis -She was started on Reglan; Protonix; and Carafate -It is not clear if she has been taking these medications, but she reports improved symptoms with ODT Zofran  Hypokalemia, hypomagnesemia with dehydration, mild AKI -Appears mildly dry and this is consistent with her history -Will gently rehydrate and replete K+ and Mag++ -Suggest discontinuation of HCTZ -Prolonged QT is thought to be related to electrolyte abnormality and should improve with treatment  RA, on immunosuppression therapy -Continue Plaquenil and daily methylprednisolone for now  DM -Prior A1c was 7.9 in 10/20 - but she has had weight loss since then -Glucose appears to be generally controlled so no treatment for now  COPD -Continue prn Albuterol, Symbicort  HTN -Hold Zestoretic, consider d/c of HCTZ component  Chronic diastolic CHF -[id number] echo with impaired diastolic relaxation -Appears to be compensated at this time, will follow with IVF -Continue ASA  HLD -Continue Lipitor Body mass index is 22.18 kg/m.  From chart review it appears she has lost about 30 pounds since July  Family Communication/Anticipated D/C date and plan/Code Status    Code Status: Full Code.  Family Communication:  Disposition Plan:    Medical Consultants:    None.     Subjective:   Hungry, asking for more food Says she has not been eating well for a while and has lost greater than 50 pounds  Objective:  Vitals:   08/01/19 0412 08/01/19 0806 08/01/19 1225 08/01/19 1243  BP: 125/85 (!) 144/73 (!) 155/80 (!) 155/80  Pulse: 77 89 91 91  Resp: 16 18 18 18   Temp: 98.5 F (36.9 C) 98 F (36.7 C) 98.4 F (36.9 C) 98.4 F (36.9 C)  TempSrc: Oral Oral  Oral Oral  SpO2: 100% 99% 100%   Weight:    55 kg  Height:    5\' 2"  (1.575 m)    Intake/Output Summary (Last 24 hours) at 08/01/2019 1501 Last data filed at 08/01/2019 1350 Gross per 24 hour  Intake 1463.4 ml  Output --  Net 1463.4 ml   Filed Weights   07/31/19 0654 08/01/19 1243  Weight: 55 kg 55 kg    Exam: In bed, chronically ill-appearing Regular rate and rhythm Positive bowel sounds soft nontender Alert and oriented x3 Moves all 4 extremities  Data Reviewed:   I have personally reviewed following labs and imaging studies:  Labs: Labs show the following:   Basic Metabolic Panel: Recent Labs  Lab 07/31/19 0803 08/01/19 0322  NA 131* 138  K 2.5* 2.9*  CL 89* 101  CO2 24 25  GLUCOSE 116* 128*  BUN 5* <5*  CREATININE 1.15* 0.76  CALCIUM 8.7* 7.9*  MG 1.0*  --    GFR Estimated Creatinine Clearance: 43.6 mL/min (by C-G formula based on SCr of 0.76 mg/dL). Liver Function Tests: Recent Labs  Lab 07/31/19 0803  AST 25  ALT 17  ALKPHOS 39  BILITOT 1.5*  PROT 6.5  ALBUMIN 3.3*   Recent Labs  Lab 07/31/19 0803  LIPASE 40   No results for input(s): AMMONIA in the last 168 hours. Coagulation profile No results for input(s): INR, PROTIME in the last 168 hours.  CBC: Recent Labs  Lab 07/31/19 0803 08/01/19 0322  WBC 12.4* 7.6  NEUTROABS 8.8*  --   HGB 11.7* 10.1*  HCT 36.0 31.3*  MCV 88.9 88.4  PLT 392 345   Cardiac Enzymes: No results for input(s): CKTOTAL, CKMB, CKMBINDEX, TROPONINI in the last 168 hours. BNP (last 3 results) No results for input(s): PROBNP in the last 8760 hours. CBG: Recent Labs  Lab 07/31/19 2203 07/31/19 2306 08/01/19 0639 08/01/19 1115  GLUCAP 68* 96 130* 125*   D-Dimer: No results for input(s): DDIMER in the last 72 hours. Hgb A1c: No results for input(s): HGBA1C in the last 72 hours. Lipid Profile: No results for input(s): CHOL, HDL, LDLCALC, TRIG, CHOLHDL, LDLDIRECT in the last 72 hours. Thyroid function  studies: No results for input(s): TSH, T4TOTAL, T3FREE, THYROIDAB in the last 72 hours.  Invalid input(s): FREET3 Anemia work up: No results for input(s): VITAMINB12, FOLATE, FERRITIN, TIBC, IRON, RETICCTPCT in the last 72 hours. Sepsis Labs: Recent Labs  Lab 07/31/19 0803 07/31/19 1153 08/01/19 0322  WBC 12.4*  --  7.6  LATICACIDVEN  --  1.6  --     Microbiology Recent Results (from the past 240 hour(s))  Urine culture     Status: Abnormal (Preliminary result)   Collection Time: 07/31/19  6:59 AM   Specimen: Urine, Random  Result Value Ref Range Status   Specimen Description URINE, RANDOM  Final   Special Requests NONE  Final   Culture (A)  Final    >=100,000 COLONIES/mL GRAM NEGATIVE RODS IDENTIFICATION AND SUSCEPTIBILITIES TO FOLLOW Performed at [facility], [address] [place], [place] [address]    Report Status PENDING  Incomplete  Blood culture (routine x 2)  Status: None (Preliminary result)   Collection Time: 07/31/19 11:53 AM   Specimen: BLOOD RIGHT FOREARM  Result Value Ref Range Status   Specimen Description BLOOD RIGHT FOREARM  Final   Special Requests   Final    BOTTLES DRAWN AEROBIC ONLY Blood Culture results may not be optimal due to an inadequate volume of blood received in culture bottles   Culture   Final    NO GROWTH < 24 HOURS Performed at [facility], [address], [place], [place] [address]    Report Status PENDING  Incomplete  Blood culture (routine x 2)     Status: None (Preliminary result)   Collection Time: 07/31/19 12:35 PM   Specimen: BLOOD LEFT HAND  Result Value Ref Range Status   Specimen Description BLOOD LEFT HAND  Final   Special Requests   Final    BOTTLES DRAWN AEROBIC AND ANAEROBIC Blood Culture results may not be optimal due to an inadequate volume of blood received in culture bottles   Culture   Final    NO GROWTH < 24 HOURS Performed at [facility], [place] [address], [place], [place] [address]     Report Status PENDING  Incomplete  SARS CORONAVIRUS 2 (TAT 6-24 HRS) Nasopharyngeal Nasopharyngeal Swab     Status: None   Collection Time: 07/31/19  4:12 PM   Specimen: Nasopharyngeal Swab  Result Value Ref Range Status   SARS Coronavirus 2 NEGATIVE NEGATIVE Final    Comment: (NOTE) SARS-CoV-2 target nucleic acids are NOT DETECTED. The SARS-CoV-2 RNA is generally detectable in upper and lower respiratory specimens during the acute phase of infection. Negative results do not preclude SARS-CoV-2 infection, do not rule out co-infections with other pathogens, and should not be used as the sole basis for treatment or other patient management decisions. Negative results must be combined with clinical observations, patient history, and epidemiological information. The expected result is Negative. Fact Sheet for Patients: SugarRoll.be Fact Sheet for Healthcare Providers: https://www.woods-mathews.com/ This test is not yet approved or cleared by the [place] FDA and  has been authorized for detection and/or diagnosis of SARS-CoV-2 by FDA under an Emergency Use Authorization (EUA). This EUA will remain  in effect (meaning this test can be used) for the duration of the COVID-19 declaration under Section 56 4(b)(1) of the Act, 21 U.S.C. section 360bbb-3(b)(1), unless the authorization is terminated or revoked sooner. Performed at [facility], [place] [address] [place], [place] [address]     Procedures and diagnostic studies:  No results found.  Medications:   . aspirin EC  81 mg Oral Daily  . atorvastatin  10 mg Oral q1800  . dicyclomine  20 mg Oral BID  . docusate sodium  100 mg Oral BID  . enoxaparin (LOVENOX) injection  40 mg Subcutaneous Q24H  . hydroxychloroquine  200 mg Oral BID  . insulin aspart  0-15 Units Subcutaneous TID WC  . insulin aspart  0-5 Units Subcutaneous QHS  . lisinopril  10 mg Oral Daily  . methylPREDNISolone   4 mg Oral Daily  . mometasone-formoterol  2 puff Inhalation BID  . pantoprazole  40 mg Oral Daily  . potassium chloride  40 mEq Oral TID  . sodium chloride flush  3 mL Intravenous Q12H  . sucralfate  1 g Oral TID WC & HS   Continuous Infusions: . 0.9 % NaCl with KCl 40 mEq / L 100 mL/hr at 07/31/19 1821  . cefTRIAXone (ROCEPHIN)  IV 1 g (08/01/19 1249)  . magnesium sulfate bolus IVPB       LOS: 0 days   Geradine Girt  [facility]   How to contact the [facility] Attending or Consulting provider [place] or covering provider during after hours [place], for this patient?  1. Check the care team in [facility] and look for a) attending/consulting TRH provider listed and b) the [facility] team listed 2. Log into [email or web address] and use [place]'s universal password to access. If you do not have the password, please contact the hospital operator. 3. Locate the [facility] provider you are looking for under [facility] and page to a number that you can be directly reached. 4. If you still have difficulty reaching the provider, please page the [facility] (Director on Call) for the Hospitalists listed on amion for assistance.  08/01/2019, 3:01 PM

## 2019-08-02 DIAGNOSIS — Z66 Do not resuscitate: Secondary | ICD-10-CM | POA: Diagnosis present

## 2019-08-02 LAB — CBC
HCT: 32.3 % — ABNORMAL LOW (ref 36.0–46.0)
Hemoglobin: 10.4 g/dL — ABNORMAL LOW (ref 12.0–15.0)
MCH: 28.8 pg (ref 26.0–34.0)
MCHC: 32.2 g/dL (ref 30.0–36.0)
MCV: 89.5 fL (ref 80.0–100.0)
Platelets: 370 10*3/uL (ref 150–400)
RBC: 3.61 MIL/uL — ABNORMAL LOW (ref 3.87–5.11)
RDW: 15.7 % — ABNORMAL HIGH (ref 11.5–15.5)
WBC: 11.8 10*3/uL — ABNORMAL HIGH (ref 4.0–10.5)
nRBC: 0 % (ref 0.0–0.2)

## 2019-08-02 LAB — BASIC METABOLIC PANEL
Anion gap: 11 (ref 5–15)
BUN: <5 mg/dL — ABNORMAL LOW (ref 8–23)
CO2: 23 mmol/L (ref 22–32)
Calcium: 8.2 mg/dL — ABNORMAL LOW (ref 8.9–10.3)
Chloride: 106 mmol/L (ref 98–111)
Creatinine, Ser: 0.74 mg/dL (ref 0.44–1.00)
GFR calc Af Amer: >60 mL/min (ref >60)
GFR calc non Af Amer: >60 mL/min (ref >60)
Glucose, Bld: 91 mg/dL (ref 70–99)
Potassium: 4.5 mmol/L (ref 3.5–5.1)
Sodium: 140 mmol/L (ref 135–145)

## 2019-08-02 LAB — GLUCOSE, CAPILLARY
Glucose-Capillary: 140 mg/dL — ABNORMAL HIGH (ref 70–99)
Glucose-Capillary: 164 mg/dL — ABNORMAL HIGH (ref 70–99)
Glucose-Capillary: 136 mg/dL — ABNORMAL HIGH (ref 70–99)
Glucose-Capillary: 131 mg/dL — ABNORMAL HIGH (ref 70–99)

## 2019-08-02 LAB — URINE CULTURE: Culture: >=100000 — AB

## 2019-08-02 MED ORDER — MAGNESIUM SULFATE 2 GM/50ML IV SOLN
2.0000 g | Freq: Once | INTRAVENOUS | Status: AC
  Administered 2019-08-02: 2 g via INTRAVENOUS
  Filled 2019-08-02 (×2): qty 50

## 2019-08-02 MED ORDER — TRAZODONE HCL 50 MG PO TABS
25.0000 mg | ORAL_TABLET | Freq: Once | ORAL | Status: AC
  Administered 2019-08-02: 25 mg via ORAL
  Filled 2019-08-02: qty 1

## 2019-08-02 NOTE — Plan of Care (Signed)

## 2019-08-02 NOTE — Progress Notes (Signed)
Progress Note    [name]  [id number] DOB: Dec 12, 1935  DOA: 07/31/2019 PCP: [name], PA-C    Brief Narrative:     Medical records reviewed and are as summarized below:  [name] is a darling 84 y.o. female with medical history significant of DM; OSA on BIPAP; HTN; HLD; COPD; RA on chronic immunosuppression; and chronic diastolic CHF presenting with urinary symptoms in the setting of chronic n/v.  She reports ongoing significant urinary symptoms with difficulty voiding, dysuria, and pelvic discomfort.  She reports that her chronic n/v has actually been better with SL Zofran - although she is not sure if she has been taking Carafate, Protonix, or Reglan.    Her last hospitalization was from 12/14-16 with n/v from gastroparesis, discharged on Reglan and Protonix with outpatient GI f/u.  UTI noted and treated with Rocephin x 2 days and Bactrim x 1 day.  She had also been hospitalized from 10/31-11/2 and before that 10/21-28. She had a GI consult with EGD that showed chronic gastritis; negative urine culture, treated with 3 days of Cefepime; and AKI that resolved with IVF (creatinine 4.91 ->0.8).  Assessment/Plan:   Principal Problem:   Urinary tract infection symptoms Active Problems:   Immunosuppression due to chronic steroid use   Rheumatoid arthritis (HCC)   DM (diabetes mellitus) (HCC)   Chronic diastolic heart failure, NYHA class 1 (HCC)   COPD (chronic obstructive pulmonary disease) (HCC)   AKI (acute kidney injury) ([place])   Essential hypertension   Hypokalemia   Hypomagnesemia   Diabetic gastroparesis ([place])   Dehydration   DNR (do not resuscitate)   UTI- enterobacter -Patient previously with E-coli UTI, treated with Rocephin x 2 days and Bactrim x 1 day -She reports persistent symptoms including dysuria, inability to completely empty bladder, and suprapubic pain -UA is unremarkable - but this may be due to partially treated  UTI -Urine culture is >100,000 gram negative rods-- continue IV rocephin -if continues to improve, plan to change to PO abx- treat for 7 days total and d/c home  Gastroparesis, chronic N/V -Patient was previously admitted in last October and early November x 2; EGD with biopsies showed chronic gastritis -She was recently (12/14-16) admitted again and diagnosed with gastroparesis -She was started on Reglan; Protonix; and Carafate -It is not clear if she has been taking these medications, but she reports improved symptoms with ODT Zofran  Hypokalemia, hypomagnesemia with dehydration, mild AKI -Appears mildly dry and this is consistent with her history -Will gently rehydrate and replete K+ and Mag++ -Suggest discontinuation of HCTZ  RA, on immunosuppression therapy -Continue Plaquenil and daily methylprednisolone for now  DM- type 2 -Prior A1c was 7.9 in 10/20 - but she has had weight loss since then -Glucose appears to be generally controlled so no treatment for now  COPD -Continue prn Albuterol, Symbicort  HTN -Hold Zestoretic, consider d/c of HCTZ component  Chronic diastolic CHF -[id number] echo with impaired diastolic relaxation -Appears to be compensated at this time, will follow with IVF -Continue ASA  HLD -Continue Lipitor   From chart review it appears she has lost about 30 pounds since July  Family Communication/Anticipated D/C date and plan/Code Status    Code Status: DNR Family Communication:  Disposition Plan: home in AM?   Medical Consultants:    None.     Subjective:   Feeling much better today  Objective:    Vitals:   08/02/19 0255 08/02/19 0745 08/02/19 0831 08/02/19 1300  BP: (!) 149/83  (!) 135/49 128/65  Pulse: 93 90 99 94  Resp: 17 18 18 18   Temp: 98.3 F (36.8 C)  98.6 F (37 C) 98.6 F (37 C)  TempSrc: Oral  Oral Oral  SpO2: 98% 95% 100% 98%  Weight:      Height:        Intake/Output Summary (Last 24 hours) at  08/02/2019 1621 Last data filed at 08/02/2019 1300 Gross per 24 hour  Intake 720 ml  Output --  Net 720 ml   Filed Weights   07/31/19 0654 08/01/19 1243  Weight: 55 kg 55 kg    Exam: On side of bed NAD rrr No increased work of breathing No LE Edema A+Ox3  Data Reviewed:   I have personally reviewed following labs and imaging studies:  Labs: Labs show the following:   Basic Metabolic Panel: Recent Labs  Lab 07/31/19 0803 08/01/19 0322 08/01/19 1420 08/02/19 0309  NA 131* 138  --  140  K 2.5* 2.9*  --  4.5  CL 89* 101  --  106  CO2 24 25  --  23  GLUCOSE 116* 128*  --  91  BUN 5* <5*  --  <5*  CREATININE 1.15* 0.76  --  0.74  CALCIUM 8.7* 7.9*  --  8.2*  MG 1.0*  --  1.3*  --    GFR Estimated Creatinine Clearance: 43.6 mL/min (by C-G formula based on SCr of 0.74 mg/dL). Liver Function Tests: Recent Labs  Lab 07/31/19 0803  AST 25  ALT 17  ALKPHOS 39  BILITOT 1.5*  PROT 6.5  ALBUMIN 3.3*   Recent Labs  Lab 07/31/19 0803  LIPASE 40   No results for input(s): AMMONIA in the last 168 hours. Coagulation profile No results for input(s): INR, PROTIME in the last 168 hours.  CBC: Recent Labs  Lab 07/31/19 0803 08/01/19 0322 08/02/19 0309  WBC 12.4* 7.6 11.8*  NEUTROABS 8.8*  --   --   HGB 11.7* 10.1* 10.4*  HCT 36.0 31.3* 32.3*  MCV 88.9 88.4 89.5  PLT 392 345 370   Cardiac Enzymes: No results for input(s): CKTOTAL, CKMB, CKMBINDEX, TROPONINI in the last 168 hours. BNP (last 3 results) No results for input(s): PROBNP in the last 8760 hours. CBG: Recent Labs  Lab 08/01/19 1115 08/01/19 1605 08/01/19 2045 08/02/19 0638 08/02/19 1148  GLUCAP 125* 125* 113* 140* 164*   D-Dimer: No results for input(s): DDIMER in the last 72 hours. Hgb A1c: No results for input(s): HGBA1C in the last 72 hours. Lipid Profile: No results for input(s): CHOL, HDL, LDLCALC, TRIG, CHOLHDL, LDLDIRECT in the last 72 hours. Thyroid function studies: No results  for input(s): TSH, T4TOTAL, T3FREE, THYROIDAB in the last 72 hours.  Invalid input(s): FREET3 Anemia work up: No results for input(s): VITAMINB12, FOLATE, FERRITIN, TIBC, IRON, RETICCTPCT in the last 72 hours. Sepsis Labs: Recent Labs  Lab 07/31/19 0803 07/31/19 1153 08/01/19 0322 08/02/19 0309  WBC 12.4*  --  7.6 11.8*  LATICACIDVEN  --  1.6  --   --     Microbiology Recent Results (from the past 240 hour(s))  Urine culture     Status: Abnormal   Collection Time: 07/31/19  6:59 AM   Specimen: Urine, Random  Result Value Ref Range Status   Specimen Description URINE, RANDOM  Final   Special Requests   Final    NONE Performed at [facility], [address],  [place] [address]    Culture >=100,000 COLONIES/mL ENTEROBACTER AEROGENES (A)  Final   Report Status 08/02/2019 FINAL  Final   Organism ID, Bacteria ENTEROBACTER AEROGENES (A)  Final      Susceptibility   Enterobacter aerogenes - MIC*    CEFAZOLIN >=64 RESISTANT Resistant     CEFTRIAXONE <=0.25 SENSITIVE Sensitive     CIPROFLOXACIN <=0.25 SENSITIVE Sensitive     GENTAMICIN <=1 SENSITIVE Sensitive     IMIPENEM 2 SENSITIVE Sensitive     NITROFURANTOIN 128 RESISTANT Resistant     TRIMETH/SULFA <=20 SENSITIVE Sensitive     PIP/TAZO <=4 SENSITIVE Sensitive     * >=100,000 COLONIES/mL ENTEROBACTER AEROGENES  Blood culture (routine x 2)     Status: None (Preliminary result)   Collection Time: 07/31/19 11:53 AM   Specimen: BLOOD RIGHT FOREARM  Result Value Ref Range Status   Specimen Description BLOOD RIGHT FOREARM  Final   Special Requests   Final    BOTTLES DRAWN AEROBIC ONLY Blood Culture results may not be optimal due to an inadequate volume of blood received in culture bottles   Culture   Final    NO GROWTH 2 DAYS Performed at [facility], [place] [address], [place], [place] [address]    Report Status PENDING  Incomplete  Blood culture (routine x 2)     Status: None (Preliminary result)    Collection Time: 07/31/19 12:35 PM   Specimen: BLOOD LEFT HAND  Result Value Ref Range Status   Specimen Description BLOOD LEFT HAND  Final   Special Requests   Final    BOTTLES DRAWN AEROBIC AND ANAEROBIC Blood Culture results may not be optimal due to an inadequate volume of blood received in culture bottles   Culture   Final    NO GROWTH 2 DAYS Performed at [facility], [place] [address], [place], [place] [address]    Report Status PENDING  Incomplete  SARS CORONAVIRUS 2 (TAT 6-24 HRS) Nasopharyngeal Nasopharyngeal Swab     Status: None   Collection Time: 07/31/19  4:12 PM   Specimen: Nasopharyngeal Swab  Result Value Ref Range Status   SARS Coronavirus 2 NEGATIVE NEGATIVE Final    Comment: (NOTE) SARS-CoV-2 target nucleic acids are NOT DETECTED. The SARS-CoV-2 RNA is generally detectable in upper and lower respiratory specimens during the acute phase of infection. Negative results do not preclude SARS-CoV-2 infection, do not rule out co-infections with other pathogens, and should not be used as the sole basis for treatment or other patient management decisions. Negative results must be combined with clinical observations, patient history, and epidemiological information. The expected result is Negative. Fact Sheet for Patients: SugarRoll.be Fact Sheet for Healthcare Providers: https://www.woods-mathews.com/ This test is not yet approved or cleared by the [place] FDA and  has been authorized for detection and/or diagnosis of SARS-CoV-2 by FDA under an Emergency Use Authorization (EUA). This EUA will remain  in effect (meaning this test can be used) for the duration of the COVID-19 declaration under Section 56 4(b)(1) of the Act, 21 U.S.C. section 360bbb-3(b)(1), unless the authorization is terminated or revoked sooner. Performed at [facility], [place] [address], [place], [place] [address]     Procedures and diagnostic  studies:  No results found.  Medications:   . aspirin EC  81 mg Oral Daily  . atorvastatin  10 mg Oral q1800  . dicyclomine  20 mg Oral BID  . docusate sodium  100 mg Oral BID  .  enoxaparin (LOVENOX) injection  40 mg Subcutaneous Q24H  . hydroxychloroquine  200 mg Oral BID  . insulin aspart  0-15 Units Subcutaneous TID WC  . insulin aspart  0-5 Units Subcutaneous QHS  . lisinopril  10 mg Oral Daily  . methylPREDNISolone  4 mg Oral Daily  . mometasone-formoterol  2 puff Inhalation BID  . pantoprazole  40 mg Oral Daily  . sodium chloride flush  3 mL Intravenous Q12H  . sucralfate  1 g Oral TID WC & HS   Continuous Infusions:    LOS: 1 day   Geradine Girt  [facility]   How to contact the [facility] Attending or Consulting provider [place] or covering provider during after hours [place], for this patient?  1. Check the care team in [facility] and look for a) attending/consulting TRH provider listed and b) the [facility] team listed 2. Log into [email or web address] and use [place]'s universal password to access. If you do not have the password, please contact the hospital operator. 3. Locate the [facility] provider you are looking for under [facility] and page to a number that you can be directly reached. 4. If you still have difficulty reaching the provider, please page the [facility] (Director on Call) for the Hospitalists listed on amion for assistance.  08/02/2019, 4:21 PM

## 2019-08-03 LAB — BASIC METABOLIC PANEL
Anion gap: 10 (ref 5–15)
BUN: <5 mg/dL — ABNORMAL LOW (ref 8–23)
CO2: 24 mmol/L (ref 22–32)
Calcium: 8.3 mg/dL — ABNORMAL LOW (ref 8.9–10.3)
Chloride: 108 mmol/L (ref 98–111)
Creatinine, Ser: 0.74 mg/dL (ref 0.44–1.00)
GFR calc Af Amer: >60 mL/min (ref >60)
GFR calc non Af Amer: >60 mL/min (ref >60)
Glucose, Bld: 100 mg/dL — ABNORMAL HIGH (ref 70–99)
Potassium: 3.8 mmol/L (ref 3.5–5.1)
Sodium: 142 mmol/L (ref 135–145)

## 2019-08-03 LAB — GLUCOSE, CAPILLARY
Glucose-Capillary: 114 mg/dL — ABNORMAL HIGH (ref 70–99)
Glucose-Capillary: 121 mg/dL — ABNORMAL HIGH (ref 70–99)

## 2019-08-03 LAB — MAGNESIUM: Magnesium: 1.7 mg/dL (ref 1.7–2.4)

## 2019-08-03 MED ORDER — PANTOPRAZOLE SODIUM 40 MG PO TBEC: 40.0000 mg | DELAYED_RELEASE_TABLET | Freq: Every day | ORAL | 0 refills | Status: DC

## 2019-08-03 MED ORDER — ASPIRIN EC 81 MG PO TBEC: 81.0000 mg | DELAYED_RELEASE_TABLET | ORAL | Status: AC

## 2019-08-03 MED ORDER — DOCUSATE SODIUM 100 MG PO CAPS: 100.0000 mg | ORAL_CAPSULE | Freq: Two times a day (BID) | ORAL | 0 refills | Status: AC

## 2019-08-03 MED ORDER — CEFUROXIME AXETIL 500 MG PO TABS: 500.0000 mg | ORAL_TABLET | Freq: Two times a day (BID) | ORAL | 0 refills | Status: AC

## 2019-08-03 NOTE — Discharge Summary (Signed)
Physician Discharge Summary  [name] [id number] DOB: [birth year] DOA: 07/31/2019  PCP: [name], PA-C  Admit date: 07/31/2019 Discharge date: 08/03/2019  Admitted From: home Discharge disposition: home   Recommendations for Outpatient Follow-Up:   1. Follow BMP, MG, K 2. Patient states she has food at home and is able to prepare her meals   Discharge Diagnosis:   Principal Problem:   Urinary tract infection symptoms Active Problems:   Immunosuppression due to chronic steroid use   Rheumatoid arthritis (HCC)   DM (diabetes mellitus) (HCC)   Chronic diastolic heart failure, NYHA class 1 (HCC)   COPD (chronic obstructive pulmonary disease) (HCC)   AKI (acute kidney injury) ([place])   Essential hypertension   Hypokalemia   Hypomagnesemia   Diabetic gastroparesis ([place])   Dehydration   DNR (do not resuscitate)    Discharge Condition: Improved.  Diet recommendation: Low sodium, heart healthy  Wound care: None.  Code status: Full.   History of Present Illness:   [name] is a 84 y.o. female with medical history significant of DM; OSA on BIPAP; HTN; HLD; COPD; RA on chronic immunosuppression; and chronic diastolic CHF presenting with urinary symptoms in the setting of chronic n/v.  She reports ongoing significant urinary symptoms with difficulty voiding, dysuria, and pelvic discomfort.  She reports that her chronic n/v has actually been better with SL Zofran - although she is not sure if she has been taking Carafate, Protonix, or Reglan.    Her last hospitalization was from 12/14-16 with n/v from gastroparesis, discharged on Reglan and Protonix with outpatient GI f/u.  UTI noted and treated with Rocephin x 2 days and Bactrim x 1 day.  She had also been hospitalized from 10/31-11/2 and before that 10/21-28. She had a GI consult with EGD that showed chronic gastritis; negative urine culture, treated with 3 days of Cefepime; and AKI that  resolved with IVF (creatinine 4.91 ->0.8).   Hospital Course by Problem:   UTI- enterobacter -Patientpreviously with E-coli UTI, treated with Rocephin x 2 days and Bactrim x 1 day -She reports persistent symptoms including dysuria, inability to completely empty bladder, and suprapubic pain -UA is unremarkable - but this may be due to partially treated UTI -Urine culture is >100,000 gram negative rods-- continue IV rocephin -if continues to improve, plan to change to PO abx- treat for 5 total days  Gastroparesis, chronic N/V -Patient was previously admitted in last October and early November x 2; EGD with biopsies showed chronic gastritis -She was recently (12/14-16) admitted again and diagnosed with gastroparesis -She was started onReglan; Protonix; and Carafate -It is not clear if she has been taking these medications, but she reports improved symptoms with ODT Zofran  Hypokalemia, hypomagnesemia with dehydration, mild AKI -Appears mildly dry and this is consistent with her history -repleted mg and k -Suggest discontinuation of HCTZ  RA, on immunosuppression therapy -Continue Plaquenil and daily methylprednisolone for now  DM- type 2 -Prior A1c was 7.9 in 10/20- but she has had weight loss since then -Glucose appears to be generally controlled so no treatment for now  COPD -Continue prn Albuterol, Symbicort  HTN -d/c hctz  Chronic diastolic CHF -[id number] echo with impaired diastolic relaxation -stable  HLD -Continue Lipitor    Medical Consultants:      Discharge Exam:   Vitals:   08/03/19 0801 08/03/19 0847  BP: 135/62   Pulse: 91   Resp: 17 17  Temp: 98.4 F (36.9 C)  SpO2: 98%    Vitals:   08/02/19 1947 08/03/19 0302 08/03/19 0801 08/03/19 0847  BP:  118/67 135/62   Pulse:  94 91   Resp:  17 17 17   Temp:  98.2 F (36.8 C) 98.4 F (36.9 C)   TempSrc:  Oral Oral   SpO2: 94% 94% 98%   Weight:      Height:        General exam:  Appears calm and comfortable.    The results of significant diagnostics from this hospitalization (including imaging, microbiology, ancillary and laboratory) are listed below for reference.     Procedures and Diagnostic Studies:   No results found.   Labs:   Basic Metabolic Panel: Recent Labs  Lab 07/31/19 0803 08/01/19 0322 08/01/19 1420 08/02/19 0309 08/03/19 0658  NA 131* 138  --  140 142  K 2.5* 2.9*  --  4.5 3.8  CL 89* 101  --  106 108  CO2 24 25  --  23 24  GLUCOSE 116* 128*  --  91 100*  BUN 5* <5*  --  <5* <5*  CREATININE 1.15* 0.76  --  0.74 0.74  CALCIUM 8.7* 7.9*  --  8.2* 8.3*  MG 1.0*  --  1.3*  --  1.7   GFR Estimated Creatinine Clearance: 43.6 mL/min (by C-G formula based on SCr of 0.74 mg/dL). Liver Function Tests: Recent Labs  Lab 07/31/19 0803  AST 25  ALT 17  ALKPHOS 39  BILITOT 1.5*  PROT 6.5  ALBUMIN 3.3*   Recent Labs  Lab 07/31/19 0803  LIPASE 40   No results for input(s): AMMONIA in the last 168 hours. Coagulation profile No results for input(s): INR, PROTIME in the last 168 hours.  CBC: Recent Labs  Lab 07/31/19 0803 08/01/19 0322 08/02/19 0309  WBC 12.4* 7.6 11.8*  NEUTROABS 8.8*  --   --   HGB 11.7* 10.1* 10.4*  HCT 36.0 31.3* 32.3*  MCV 88.9 88.4 89.5  PLT 392 345 370   Cardiac Enzymes: No results for input(s): CKTOTAL, CKMB, CKMBINDEX, TROPONINI in the last 168 hours. BNP: Invalid input(s): POCBNP CBG: Recent Labs  Lab 08/02/19 0638 08/02/19 1148 08/02/19 1625 08/02/19 2100 08/03/19 0641  GLUCAP 140* 164* 136* 131* 114*   D-Dimer No results for input(s): DDIMER in the last 72 hours. Hgb A1c No results for input(s): HGBA1C in the last 72 hours. Lipid Profile No results for input(s): CHOL, HDL, LDLCALC, TRIG, CHOLHDL, LDLDIRECT in the last 72 hours. Thyroid function studies No results for input(s): TSH, T4TOTAL, T3FREE, THYROIDAB in the last 72 hours.  Invalid input(s): FREET3 Anemia work up No  results for input(s): VITAMINB12, FOLATE, FERRITIN, TIBC, IRON, RETICCTPCT in the last 72 hours. Microbiology Recent Results (from the past 240 hour(s))  Urine culture     Status: Abnormal   Collection Time: 07/31/19  6:59 AM   Specimen: Urine, Random  Result Value Ref Range Status   Specimen Description URINE, RANDOM  Final   Special Requests   Final    NONE Performed at [facility], [address] [place], [place] [address]    Culture >=100,000 COLONIES/mL ENTEROBACTER AEROGENES (A)  Final   Report Status 08/02/2019 FINAL  Final   Organism ID, Bacteria ENTEROBACTER AEROGENES (A)  Final      Susceptibility   Enterobacter aerogenes - MIC*    CEFAZOLIN >=64 RESISTANT Resistant     CEFTRIAXONE <=0.25 SENSITIVE Sensitive     CIPROFLOXACIN <=0.25  SENSITIVE Sensitive     GENTAMICIN <=1 SENSITIVE Sensitive     IMIPENEM 2 SENSITIVE Sensitive     NITROFURANTOIN 128 RESISTANT Resistant     TRIMETH/SULFA <=20 SENSITIVE Sensitive     PIP/TAZO <=4 SENSITIVE Sensitive     * >=100,000 COLONIES/mL ENTEROBACTER AEROGENES  Blood culture (routine x 2)     Status: None (Preliminary result)   Collection Time: 07/31/19 11:53 AM   Specimen: BLOOD RIGHT FOREARM  Result Value Ref Range Status   Specimen Description BLOOD RIGHT FOREARM  Final   Special Requests   Final    BOTTLES DRAWN AEROBIC ONLY Blood Culture results may not be optimal due to an inadequate volume of blood received in culture bottles   Culture   Final    NO GROWTH 2 DAYS Performed at [facility], [place] [address] [place], [place] [address]    Report Status PENDING  Incomplete  Blood culture (routine x 2)     Status: None (Preliminary result)   Collection Time: 07/31/19 12:35 PM   Specimen: BLOOD LEFT HAND  Result Value Ref Range Status   Specimen Description BLOOD LEFT HAND  Final   Special Requests   Final    BOTTLES DRAWN AEROBIC AND ANAEROBIC Blood Culture results may not be optimal due to an inadequate volume  of blood received in culture bottles   Culture   Final    NO GROWTH 2 DAYS Performed at [facility], [place] [address] [place], [place] [address]    Report Status PENDING  Incomplete  SARS CORONAVIRUS 2 (TAT 6-24 HRS) Nasopharyngeal Nasopharyngeal Swab     Status: None   Collection Time: 07/31/19  4:12 PM   Specimen: Nasopharyngeal Swab  Result Value Ref Range Status   SARS Coronavirus 2 NEGATIVE NEGATIVE Final    Comment: (NOTE) SARS-CoV-2 target nucleic acids are NOT DETECTED. The SARS-CoV-2 RNA is generally detectable in upper and lower respiratory specimens during the acute phase of infection. Negative results do not preclude SARS-CoV-2 infection, do not rule out co-infections with other pathogens, and should not be used as the sole basis for treatment or other patient management decisions. Negative results must be combined with clinical observations, patient history, and epidemiological information. The expected result is Negative. Fact Sheet for Patients: SugarRoll.be Fact Sheet for Healthcare Providers: https://www.woods-mathews.com/ This test is not yet approved or cleared by the [place] FDA and  has been authorized for detection and/or diagnosis of SARS-CoV-2 by FDA under an Emergency Use Authorization (EUA). This EUA will remain  in effect (meaning this test can be used) for the duration of the COVID-19 declaration under Section 56 4(b)(1) of the Act, 21 U.S.C. section 360bbb-3(b)(1), unless the authorization is terminated or revoked sooner. Performed at [facility], [place] [address] [place], [place] [address]      Discharge Instructions:   Discharge Instructions    Diet - low sodium heart healthy   Complete by: As directed    Increase activity slowly   Complete by: As directed      Allergies as of 08/03/2019   No Known Allergies     Medication List    STOP taking these medications   metoCLOPramide  5 MG tablet Commonly known as: Reglan     TAKE these medications   albuterol 0.63 MG/3ML nebulizer solution Commonly known as: ACCUNEB Take 2 ampules by nebulization every 6 (six) hours as needed for wheezing.   aspirin EC 81 MG tablet  Take 1 tablet (81 mg total) by mouth every Monday, Wednesday, and Friday. Start taking on: August 04, 2019   atorvastatin 10 MG tablet Commonly known as: Lipitor Take 1 Tablet by mouth daily What changed:   how much to take  how to take this  when to take this  additional instructions   dicyclomine 20 MG tablet Commonly known as: BENTYL Take 20 mg by mouth 2 (two) times daily.   docusate sodium 100 MG capsule Commonly known as: COLACE Take 1 capsule (100 mg total) by mouth 2 (two) times daily.   Gas-X Extra Strength 125 MG chewable tablet Generic drug: simethicone Chew 125 mg by mouth every 6 (six) hours as needed for flatulence.   hydroxychloroquine 200 MG tablet Commonly known as: PLAQUENIL Take 200 mg by mouth 2 (two) times daily.   hydrOXYzine 10 MG tablet Commonly known as: ATARAX/VISTARIL 1 q 8h prn itching What changed:   how much to take  how to take this  when to take this  reasons to take this  additional instructions   MAGnesium-Oxide 400 (241.3 Mg) MG tablet Generic drug: magnesium oxide Take 1 tablet by mouth daily.   methylPREDNISolone 4 MG tablet Commonly known as: MEDROL Take 4 mg by mouth daily.   ondansetron 4 MG disintegrating tablet Commonly known as: Zofran ODT Take 1 tablet (4 mg total) by mouth every 8 (eight) hours as needed for nausea or vomiting.   OneTouch Verio test strip Generic drug: glucose blood Use to check blood sugar 3 to 4 times a day before meals   pantoprazole 40 MG tablet Commonly known as: PROTONIX Take 1 tablet (40 mg total) by mouth daily.   promethazine 25 MG tablet Commonly known as: PHENERGAN Take 1 tablet (25 mg total) by mouth every 6 (six) hours as needed for  nausea or vomiting.   sucralfate 1 GM/10ML suspension Commonly known as: CARAFATE Take 10 mLs (1 g total) by mouth 4 (four) times daily -  with meals and at bedtime.   Symbicort 160-4.5 MCG/ACT inhaler Generic drug: budesonide-formoterol Inhale 2 puffs into the lungs 2 (two) times daily.   traMADol 50 MG tablet Commonly known as: ULTRAM Take 1 tablet (50 mg total) by mouth every 6 (six) hours as needed. What changed: reasons to take this      Follow-up Information    Rodriguez-South[place], Sunday Spillers, PA-C Follow up in 1 week(s).   Specialties: Internal Medicine, Emergency Medicine Why: monitor K and Cr as well as Mg closely Contact information: [address] [place] [address] [phone]            Time coordinating discharge: 35 min  Signed:  Geradine Girt DO  [facility] 08/03/2019, 8:55 AM

## 2019-08-03 NOTE — Progress Notes (Signed)
Discharge instructions addressed;pt in stable condition; Pt to be pick up by a family member at the [employer] entrance

## 2019-08-03 NOTE — Discharge Instructions (Signed)
Can use melatonin at night to help with your sleep- 2-3 mg and be sure to take as it get dark for the night (do not wait unit you go to bed)

## 2019-08-03 NOTE — Care Management Important Message (Signed)
Important Message  Patient Details  Name: [name] MRN: [id number] Date of Birth: 23-Aug-1935   Medicare Important Message Given:  Yes     Memory Argue 08/03/2019, 1:23 PM

## 2019-08-04 ENCOUNTER — Telehealth: Payer: Self-pay

## 2019-08-05 ENCOUNTER — Telehealth: Payer: Self-pay

## 2019-08-05 LAB — CULTURE, BLOOD (ROUTINE X 2)
Culture: NO GROWTH
Culture: NO GROWTH

## 2019-08-05 NOTE — Telephone Encounter (Signed)
Transition Care Management Follow-up Telephone Call  Date of discharge and from where: 08/03/2019 from [facility]  How have you been since you were released from the hospital? Little better  Any questions or concerns? No   Items Reviewed:  Did the pt receive and understand the discharge instructions provided? Yes   Medications obtained and verified? Yes   Any new allergies since your discharge? No   Dietary orders reviewed? Yes  Do you have support at home? Yes   Other (ie: DME, Home Health, etc) none  Functional Questionnaire: (I = Independent and D = Dependent) ADL's: I  Bathing/Dressing- I   Meal Prep- D  Eating- I  Maintaining continence- I  Transferring/Ambulation- I  Managing Meds- D   Follow up appointments reviewed:    PCP Hospital f/u appt confirmed? Yes  Scheduled to see Minette Brine FNP on 08/09/2019 @ 4:30.  Are transportation arrangements needed? No   If their condition worsens, is the pt aware to call  their PCP or go to the ED? Yes  Was the patient provided with contact information for the PCP's office or ED? Yes  Was the pt encouraged to call back with questions or concerns? Yes

## 2019-08-09 ENCOUNTER — Other Ambulatory Visit: Payer: Self-pay

## 2019-08-09 ENCOUNTER — Encounter: Payer: Medicare Other | Admitting: Nurse Practitioner

## 2019-08-11 ENCOUNTER — Other Ambulatory Visit: Payer: Self-pay

## 2019-08-11 ENCOUNTER — Telehealth (INDEPENDENT_AMBULATORY_CARE_PROVIDER_SITE_OTHER): Payer: Medicare Other | Admitting: Nurse Practitioner

## 2019-08-11 ENCOUNTER — Encounter: Payer: Self-pay | Admitting: Nurse Practitioner

## 2019-08-11 VITALS — BP 147/83 | HR 132 | Wt 121.0 lb

## 2019-08-11 DIAGNOSIS — E876 Hypokalemia: Secondary | ICD-10-CM

## 2019-08-11 DIAGNOSIS — N390 Urinary tract infection, site not specified: Secondary | ICD-10-CM

## 2019-08-11 MED ORDER — PHENAZOPYRIDINE HCL 200 MG PO TABS: 200.0000 mg | ORAL_TABLET | Freq: Three times a day (TID) | ORAL | 0 refills | Status: DC | PRN

## 2019-08-11 NOTE — Progress Notes (Signed)
Virtual Visit via Telephone   This visit type was conducted due to national recommendations for restrictions regarding the COVID-19 Pandemic (e.g. social distancing) in an effort to limit this patient's exposure and mitigate transmission in our community.  Due to her co-morbid illnesses, this patient is at least at moderate risk for complications without adequate follow up.  This format is felt to be most appropriate for this patient at this time.  All issues noted in this document were discussed and addressed.  A limited physical exam was performed with this format.    This visit type was conducted due to national recommendations for restrictions regarding the COVID-19 Pandemic (e.g. social distancing) in an effort to limit this patient's exposure and mitigate transmission in our community.  Patients identity confirmed using two different identifiers.  This format is felt to be most appropriate for this patient at this time.  All issues noted in this document were discussed and addressed.  No physical exam was performed (except for noted visual exam findings with Video Visits).    Date:  08/16/2019   ID:  [name], DOB Apr 29, 1936, MRN [id number]  Patient Location:  Home - spoke with [name]  Provider location:   Office    Chief Complaint:  Hospital follow up   History of Present Illness:    [name] is a 84 y.o. female who presents via video conferencing for a telehealth visit today.    The patient does not have symptoms concerning for COVID-19 infection (fever, chills, cough, or new shortness of breath).   This visit is for a hospital follow up admission from 1/2 - 1/5 she is unable to pass her urine. She was treated for a urinary tract infection with e coli and treated with Rocephin x 2, cefipime and oral antibiotics on discharge. She was also noted to have hypokalemia and hypomagnesia.  Additionally she had n/v with gastroparesis with an admission on 12/14-16, discharged on  Reglan and Protonix with outpatient GI.  She had a GI consult with EGD that showed chronic gastritis.      She is complaining of stomach pain currently.  She had the same symptoms before her hospital admission. She has not urinated since last night.  She is hurting at the bottom of her stomach, she will have 2-3 drops of water. She is also having some difficulty breathing as well does have COPD.      Past Medical History:  Diagnosis Date  . Allergy   . Anemia    "when I was young"  . Anginal pain ([place])   . Anxiety   . Arthritis    "all over my body"   . Chest pain 04/07/2014  . Chronic diastolic heart failure, NYHA class 1 ([place]) 01/22/2015  . Chronic lower back pain   . Chronic steroid use 12/11/2012  . COPD (chronic obstructive pulmonary disease) ([place]) 01/09/2017  . 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 Elevated lactic acid level 01/22/2015  . Essential hypertension 06/15/2018  . Frequent UTI    "recently" (06/14/2015)  . Gastric mass   . GERD (gastroesophageal reflux disease)   . Headache(784.0)    "weekly; wake up w/them " (110/19/2018)  . Heart murmur   . High cholesterol   . History of blood transfusion ~ 1954   "related to my periods"  . Migraine    "once/month maybe" (05/16/2017)  . Multiple falls 06/14/2015  . Noncompliance 12/11/2012  . OSA (obstructive sleep apnea)    "  I think I'm using a BiPAP" (06/14/2015)  . Sepsis ([place]) 01/09/2017  . Syncope and collapse 12/11/2012  . Tachycardia with heart rate 100-120 beats per minute 04/08/2014  . Type II diabetes mellitus ([place])   . Uncontrolled type 2 diabetes mellitus with complication [facility])    Past Surgical History:  Procedure Laterality Date  . APPENDECTOMY  1970   Archie Endo 12/12/2010  . BACK SURGERY    . BIOPSY  05/25/2019   Procedure: BIOPSY;  Surgeon: Rush Landmark Telford Nab., MD;  Location: [facility];  Service: Gastroenterology;;  . [place]   Archie Endo 12/12/2010    . ESOPHAGOGASTRODUODENOSCOPY N/A 05/25/2019   Procedure: ESOPHAGOGASTRODUODENOSCOPY (EGD);  Surgeon: Irving Copas., MD;  Location: [facility];  Service: Gastroenterology;  Laterality: N/A;  . EUS N/A 10/16/2017   Procedure: UPPER ENDOSCOPIC ULTRASOUND (EUS) RADIAL;  Surgeon: Milus Banister, MD;  Location: [facility];  Service: Endoscopy;  Laterality: N/A;  . FRACTURE SURGERY    . LAPAROSCOPIC CHOLECYSTECTOMY  1990's  . LUMBAR MICRODISCECTOMY Left 09/2006   L5-S1/notes 12/12/2010  . MULTIPLE EXTRACTIONS WITH ALVEOLOPLASTY Bilateral 06/13/2017   Procedure: MULTIPLE EXTRACTION WITH ALVEOLOPLASTY;  Surgeon: Diona Browner, DDS;  Location: [facility];  Service: Oral Surgery;  Laterality: Bilateral;  . MULTIPLE TOOTH EXTRACTIONS    . ORIF SHOULDER FRACTURE Left ~ 2009   "put cement down in it"   . TONSILLECTOMY  1950's?  . TOTAL ABDOMINAL HYSTERECTOMY  1979   Archie Endo 12/12/2010     Current Meds  Medication Sig  . albuterol (ACCUNEB) 0.63 MG/3ML nebulizer solution Take 2 ampules by nebulization every 6 (six) hours as needed for wheezing.  Marland Kitchen aspirin EC 81 MG tablet Take 1 tablet (81 mg total) by mouth every Monday, Wednesday, and Friday.  Marland Kitchen atorvastatin (LIPITOR) 10 MG tablet Take 1 Tablet by mouth daily (Patient taking differently: Take 10 mg by mouth daily at 6 PM. )  . docusate sodium (COLACE) 100 MG capsule Take 1 capsule (100 mg total) by mouth 2 (two) times daily.  . hydrOXYzine (ATARAX/VISTARIL) 10 MG tablet 1 q 8h prn itching (Patient taking differently: Take 10 mg by mouth every 8 (eight) hours as needed for itching. )  . lisinopril-hydrochlorothiazide (ZESTORETIC) 10-12.5 MG tablet Take 1 tablet by mouth daily.  . metoCLOPramide (REGLAN) 5 MG tablet Take 5 mg by mouth 3 (three) times daily before meals.  Marland Kitchen omeprazole (PRILOSEC) 20 MG capsule Take 20 mg by mouth daily.  . pantoprazole (PROTONIX) 40 MG tablet Take 1 tablet (40 mg total) by mouth daily.     Allergies:   Patient  has no known allergies.   Social History   Tobacco Use  . Smoking status: Former Smoker    Types: Cigarettes  . Smokeless tobacco: Never Used  . Tobacco comment: 05/16/2017"1 pack of cigarettes would last 3-4 months; haven't had any cigarettes for 40 years"  Substance Use Topics  . Alcohol use: Not Currently    Comment: 05/16/2017 "used to drink beer in the 1990's; don't drink at all now"  . Drug use: No     Family Hx: The patient's family history includes Cancer - Other in her sister; Diabetes Mellitus II in her sister; Heart attack in her father; Hypertension in her mother and sister; Migraines in her niece. There is no history of Colon cancer.  ROS:   Please see the history of present illness.    Review of Systems  Constitutional: Negative.   Respiratory: Negative.  Negative for  cough.   Cardiovascular: Negative.   Gastrointestinal: Negative for abdominal pain.  Genitourinary:       Unable to urinate  Neurological: Negative for dizziness and tingling.  Psychiatric/Behavioral: Negative.     All other systems reviewed and are negative.   Labs/Other Tests and Data Reviewed:    Recent Labs: 07/12/2019: TSH 2.100 08/13/2019: ALT 14; BUN 6; Creatinine, Ser 1.06; Hemoglobin 10.5; Magnesium 1.0; Platelets 379; Potassium 2.8; Sodium 139   Recent Lipid Panel Lab Results  Component Value Date/Time   CHOL 123 05/30/2019 12:59 PM   CHOL 368 (H) 08/13/2018 04:06 PM   TRIG 82 05/30/2019 12:59 PM   HDL 49 05/30/2019 12:59 PM   HDL 70 08/13/2018 04:06 PM   CHOLHDL 2.5 05/30/2019 12:59 PM   LDLCALC 58 05/30/2019 12:59 PM   LDLCALC 243 (H) 08/13/2018 04:06 PM    Wt Readings from Last 3 Encounters:  08/13/19 120 lb (54.4 kg)  08/11/19 121 lb (54.9 kg)  08/01/19 121 lb 4.1 oz (55 kg)     Exam:    Vital Signs:  BP (!) 147/83 (BP Location: Right Arm, Patient Position: Sitting, Cuff Size: Small)   Pulse (!) 132   Wt 121 lb (54.9 kg)   BMI 22.13 kg/m     Physical Exam    Constitutional: She is oriented to person, place, and time. No distress.  Neurological: She is alert and oriented to person, place, and time.  Psychiatric: Mood, memory, affect and judgment normal.    ASSESSMENT & PLAN:    1. Urinary tract infection without hematuria, site unspecified  Recent admission to hospital 1/2- 1/5 for urinary tract infection treated with Rocephin, cefipime and oral antibiotics  Now continues to not be able to urinate.   I have advised patient and niece to go to ER for evaluation and possible craterization however the patient declines, will try to have a home health nurse to visit as soon as available  Advised if worse to go to ER  Also will treat with another antibiotic in the event this is a reoccurrence of a UTI. - BMP8+eGFR; Future - phenazopyridine (PYRIDIUM) 200 MG tablet; Take 1 tablet (200 mg total) by mouth 3 (three) times daily as needed for pain.  Dispense: 10 tablet; Refill: 0 - Ambulatory referral to Home Health  2. Hypomagnesemia  Will recheck levels - Magnesium; Future - Ambulatory referral to Home Health  3. Hypokalemia  Will recheck levels  - Ambulatory referral to [place]    COVID-19 Education: The signs and symptoms of COVID-19 were discussed with the patient and how to seek care for testing (follow up with PCP or arrange E-visit).  The importance of social distancing was discussed today.  Patient Risk:   After full review of this patients clinical status, I feel that they are at least moderate risk at this time.  Time:   Today, I have spent 15 minutes/ seconds with the patient with telehealth technology discussing above diagnoses.     Medication Adjustments/Labs and Tests Ordered: Current medicines are reviewed at length with the patient today.  Concerns regarding medicines are outlined above.   Tests Ordered: Orders Placed This Encounter  Procedures  . BMP8+eGFR  . Magnesium  . Ambulatory referral to Home Health     Medication Changes: Meds ordered this encounter  Medications  . phenazopyridine (PYRIDIUM) 200 MG tablet    Sig: Take 1 tablet (200 mg total) by mouth 3 (three) times daily as needed for pain.  Dispense:  10 tablet    Refill:  0    Disposition:  Follow up prn  Signed, Minette Brine, FNP

## 2019-08-13 ENCOUNTER — Emergency Department (HOSPITAL_COMMUNITY): Payer: Medicare Other

## 2019-08-13 ENCOUNTER — Other Ambulatory Visit: Payer: Self-pay

## 2019-08-13 ENCOUNTER — Encounter (HOSPITAL_COMMUNITY): Payer: Self-pay

## 2019-08-13 ENCOUNTER — Emergency Department (HOSPITAL_COMMUNITY)
Admission: EM | Admit: 2019-08-13 | Discharge: 2019-08-14 | Disposition: A | Discharge status: Home / Self Care | Payer: Medicare Other | Attending: Emergency Medicine | Admitting: Emergency Medicine

## 2019-08-13 ENCOUNTER — Ambulatory Visit (INDEPENDENT_AMBULATORY_CARE_PROVIDER_SITE_OTHER)
Admission: EM | Admit: 2019-08-13 | Discharge: 2019-08-13 | Disposition: A | Discharge status: Home / Self Care | Payer: Medicare Other | Source: Home / Self Care

## 2019-08-13 ENCOUNTER — Telehealth (HOSPITAL_COMMUNITY): Payer: Self-pay | Admitting: Emergency Medicine

## 2019-08-13 DIAGNOSIS — N390 Urinary tract infection, site not specified: Secondary | ICD-10-CM | POA: Insufficient documentation

## 2019-08-13 DIAGNOSIS — E876 Hypokalemia: Secondary | ICD-10-CM | POA: Insufficient documentation

## 2019-08-13 DIAGNOSIS — I110 Hypertensive heart disease with heart failure: Secondary | ICD-10-CM | POA: Insufficient documentation

## 2019-08-13 DIAGNOSIS — R1030 Lower abdominal pain, unspecified: Secondary | ICD-10-CM | POA: Insufficient documentation

## 2019-08-13 DIAGNOSIS — R300 Dysuria: Secondary | ICD-10-CM | POA: Insufficient documentation

## 2019-08-13 DIAGNOSIS — Z87448 Personal history of other diseases of urinary system: Secondary | ICD-10-CM | POA: Diagnosis not present

## 2019-08-13 DIAGNOSIS — Z7982 Long term (current) use of aspirin: Secondary | ICD-10-CM | POA: Diagnosis not present

## 2019-08-13 DIAGNOSIS — N179 Acute kidney failure, unspecified: Secondary | ICD-10-CM | POA: Diagnosis not present

## 2019-08-13 DIAGNOSIS — I5032 Chronic diastolic (congestive) heart failure: Secondary | ICD-10-CM | POA: Diagnosis not present

## 2019-08-13 DIAGNOSIS — R000 Tachycardia, unspecified: Secondary | ICD-10-CM | POA: Diagnosis not present

## 2019-08-13 DIAGNOSIS — Z79899 Other long term (current) drug therapy: Secondary | ICD-10-CM | POA: Insufficient documentation

## 2019-08-13 DIAGNOSIS — E1143 Type 2 diabetes mellitus with diabetic autonomic (poly)neuropathy: Secondary | ICD-10-CM | POA: Diagnosis not present

## 2019-08-13 DIAGNOSIS — Z87891 Personal history of nicotine dependence: Secondary | ICD-10-CM | POA: Insufficient documentation

## 2019-08-13 DIAGNOSIS — R7989 Other specified abnormal findings of blood chemistry: Secondary | ICD-10-CM | POA: Diagnosis present

## 2019-08-13 LAB — BASIC METABOLIC PANEL
Anion gap: 14 (ref 5–15)
BUN: 6 mg/dL — ABNORMAL LOW (ref 8–23)
CO2: 21 mmol/L — ABNORMAL LOW (ref 22–32)
Calcium: 8.4 mg/dL — ABNORMAL LOW (ref 8.9–10.3)
Chloride: 104 mmol/L (ref 98–111)
Creatinine, Ser: 1.06 mg/dL — ABNORMAL HIGH (ref 0.44–1.00)
GFR calc Af Amer: 57 mL/min — ABNORMAL LOW (ref >60)
GFR calc non Af Amer: 49 mL/min — ABNORMAL LOW (ref >60)
Glucose, Bld: 128 mg/dL — ABNORMAL HIGH (ref 70–99)
Potassium: 2.8 mmol/L — ABNORMAL LOW (ref 3.5–5.1)
Sodium: 139 mmol/L (ref 135–145)

## 2019-08-13 LAB — CBC
HCT: 35.1 % — ABNORMAL LOW (ref 36.0–46.0)
HCT: 33.1 % — ABNORMAL LOW (ref 36.0–46.0)
Hemoglobin: 10.9 g/dL — ABNORMAL LOW (ref 12.0–15.0)
Hemoglobin: 10.5 g/dL — ABNORMAL LOW (ref 12.0–15.0)
MCH: 28.2 pg (ref 26.0–34.0)
MCH: 29.1 pg (ref 26.0–34.0)
MCHC: 31.1 g/dL (ref 30.0–36.0)
MCHC: 31.7 g/dL (ref 30.0–36.0)
MCV: 90.9 fL (ref 80.0–100.0)
MCV: 91.7 fL (ref 80.0–100.0)
Platelets: 425 10*3/uL — ABNORMAL HIGH (ref 150–400)
Platelets: 379 10*3/uL (ref 150–400)
RBC: 3.86 MIL/uL — ABNORMAL LOW (ref 3.87–5.11)
RBC: 3.61 MIL/uL — ABNORMAL LOW (ref 3.87–5.11)
RDW: 16.8 % — ABNORMAL HIGH (ref 11.5–15.5)
RDW: 16.7 % — ABNORMAL HIGH (ref 11.5–15.5)
WBC: 10.6 10*3/uL — ABNORMAL HIGH (ref 4.0–10.5)
WBC: 8.9 10*3/uL (ref 4.0–10.5)
nRBC: 0 % (ref 0.0–0.2)
nRBC: 0 % (ref 0.0–0.2)

## 2019-08-13 LAB — COMPREHENSIVE METABOLIC PANEL
ALT: 14 U/L (ref 0–44)
AST: 27 U/L (ref 15–41)
Albumin: 3.7 g/dL (ref 3.5–5.0)
Alkaline Phosphatase: 49 U/L (ref 38–126)
Anion gap: 14 (ref 5–15)
BUN: 6 mg/dL — ABNORMAL LOW (ref 8–23)
CO2: 23 mmol/L (ref 22–32)
Calcium: 8.7 mg/dL — ABNORMAL LOW (ref 8.9–10.3)
Chloride: 100 mmol/L (ref 98–111)
Creatinine, Ser: 1.15 mg/dL — ABNORMAL HIGH (ref 0.44–1.00)
GFR calc Af Amer: 52 mL/min — ABNORMAL LOW (ref >60)
GFR calc non Af Amer: 45 mL/min — ABNORMAL LOW (ref >60)
Glucose, Bld: 146 mg/dL — ABNORMAL HIGH (ref 70–99)
Potassium: 2.9 mmol/L — ABNORMAL LOW (ref 3.5–5.1)
Sodium: 137 mmol/L (ref 135–145)
Total Bilirubin: 0.7 mg/dL (ref 0.3–1.2)
Total Protein: 7.1 g/dL (ref 6.5–8.1)

## 2019-08-13 LAB — POCT URINALYSIS DIP (DEVICE)
Glucose, UA: 100 mg/dL — AB
Nitrite: POSITIVE — AB
Protein, ur: 100 mg/dL — AB
Specific Gravity, Urine: <=1.005 (ref 1.005–1.030)
Urobilinogen, UA: 2 mg/dL — ABNORMAL HIGH (ref 0.0–1.0)
pH: 5.5 (ref 5.0–8.0)

## 2019-08-13 LAB — LACTIC ACID, PLASMA: Lactic Acid, Venous: 2 mmol/L (ref 0.5–1.9)

## 2019-08-13 LAB — MAGNESIUM: Magnesium: 1 mg/dL — ABNORMAL LOW (ref 1.7–2.4)

## 2019-08-13 MED ORDER — CEFTRIAXONE SODIUM 1 G IJ SOLR
1.0000 g | Freq: Once | INTRAMUSCULAR | Status: AC
  Administered 2019-08-13: 1 g via INTRAMUSCULAR

## 2019-08-13 MED ORDER — CEFTRIAXONE SODIUM 1 G IJ SOLR
INTRAMUSCULAR | Status: AC
  Filled 2019-08-13: qty 10

## 2019-08-13 MED ORDER — MAGNESIUM CHLORIDE 64 MG PO TBEC: 1.0000 | DELAYED_RELEASE_TABLET | Freq: Two times a day (BID) | ORAL | 0 refills | Status: DC

## 2019-08-13 MED ORDER — LACTATED RINGERS IV BOLUS
1000.0000 mL | Freq: Once | INTRAVENOUS | Status: AC
  Administered 2019-08-13: 22:00:00 1000 mL via INTRAVENOUS

## 2019-08-13 MED ORDER — LIDOCAINE HCL (PF) 1 % IJ SOLN
INTRAMUSCULAR | Status: AC
  Filled 2019-08-13: qty 2

## 2019-08-13 MED ORDER — ACETAMINOPHEN 325 MG PO TABS
650.0000 mg | ORAL_TABLET | Freq: Once | ORAL | Status: DC
  Filled 2019-08-13: qty 2

## 2019-08-13 MED ORDER — POTASSIUM CHLORIDE 40 MEQ/15ML (20%) PO SOLN: 20.0000 meq | Freq: Every day | ORAL | 0 refills | Status: DC

## 2019-08-13 MED ORDER — POTASSIUM CHLORIDE 20 MEQ/15ML (10%) PO SOLN
40.0000 meq | Freq: Once | ORAL | Status: AC
  Administered 2019-08-13: 23:00:00 40 meq via ORAL
  Filled 2019-08-13: qty 30

## 2019-08-13 MED ORDER — CEPHALEXIN 500 MG PO CAPS: 500.0000 mg | ORAL_CAPSULE | Freq: Four times a day (QID) | ORAL | 0 refills | Status: AC

## 2019-08-13 MED ORDER — MAGNESIUM SULFATE 2 GM/50ML IV SOLN
2.0000 g | Freq: Once | INTRAVENOUS | Status: AC
  Administered 2019-08-13: 23:00:00 2 g via INTRAVENOUS
  Filled 2019-08-13: qty 50

## 2019-08-13 MED ORDER — SODIUM CHLORIDE 0.9 % IV SOLN
1.0000 g | Freq: Once | INTRAVENOUS | Status: AC
  Administered 2019-08-14: 01:00:00 1 g via INTRAVENOUS
  Filled 2019-08-13: qty 10

## 2019-08-13 NOTE — ED Notes (Signed)
ED Provider at bedside. 

## 2019-08-13 NOTE — ED Notes (Signed)
Pt ambulated to and from the bathroom independently.

## 2019-08-13 NOTE — ED Triage Notes (Signed)
Pt states having lower abdominal pain x 3 days, pt states is painful when urinating. Pt reports she was at the hospital on 07/31/2019 for the same reason.

## 2019-08-13 NOTE — Discharge Instructions (Signed)
Your urine is still consistent with UTI.  We will give you an injection of antibiotics here today, followed by 10 days of oral antibiotics.  Drink plenty of water to empty bladder regularly. Avoid alcohol and caffeine as these may irritate the bladder.   I will call you if there are any concerning findings with your blood testing.  Please follow up with your primary care provider in the next week for recheck.  Any worsening of symptoms- pain, fevers, inability to urinate, weakness, or otherwise worsening please go to the ER.

## 2019-08-13 NOTE — ED Provider Notes (Signed)
[facility] EMERGENCY DEPARTMENT Provider Note   CSN: [id number] Arrival date & time: 08/13/19  2125     History Chief Complaint  Patient presents with  . Abnormal Lab    [name] is a 84 y.o. female.  The history is provided by the patient and medical records. No language interpreter was used.  Abnormal Lab  [name] is a 84 y.o. female who presents to the Emergency Department complaining of abnormal lab. She presents the emergency department for evaluation of hypokalemia on lab performed earlier today. She was admitted to the hospital on January 2-5 for UTI with urinary retention with AKI. She was discharged home on antibiotics and was able to complete her course. She was feeling well after completely of her antibiotics. About three days ago she developed recurrent dysuria and decreased urinary output. She went to urgent care today and had labs obtained. She was called due to hypokalemia on outpatient labs with a potassium of 2.9. She also had a urinalysis performed that was concerning for UTI and she was treated with Rocephin and started on oral Keflex. She denies any fevers, nausea, vomiting. She does have some suprapubic discomfort.    Past Medical History:  Diagnosis Date  . Allergy   . Anemia    "when I was young"  . Anginal pain ([place])   . Anxiety   . Arthritis    "all over my body"   . Chest pain 04/07/2014  . Chronic diastolic heart failure, NYHA class 1 ([place]) 01/22/2015  . Chronic lower back pain   . Chronic steroid use 12/11/2012  . COPD (chronic obstructive pulmonary disease) ([place]) 01/09/2017  . 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 Elevated lactic acid level 01/22/2015  . Essential hypertension 06/15/2018  . Frequent UTI    "recently" (06/14/2015)  . Gastric mass   . GERD (gastroesophageal reflux disease)   . Headache(784.0)    "weekly; wake up w/them " (110/19/2018)  . Heart murmur   . High  cholesterol   . History of blood transfusion ~ 1954   "related to my periods"  . Migraine    "once/month maybe" (05/16/2017)  . Multiple falls 06/14/2015  . Noncompliance 12/11/2012  . OSA (obstructive sleep apnea)    "I think I'm using a BiPAP" (06/14/2015)  . Sepsis ([place]) 01/09/2017  . Syncope and collapse 12/11/2012  . Tachycardia with heart rate 100-120 beats per minute 04/08/2014  . Type II diabetes mellitus ([place])   . Uncontrolled type 2 diabetes mellitus with complication [facility])     Patient Active Problem List   Diagnosis Date Noted  . DNR (do not resuscitate) 08/02/2019  . Dehydration 08/01/2019  . Urinary tract infection symptoms 07/31/2019  . Diabetic gastroparesis ([place]) 07/31/2019  . Unexplained weight loss 07/12/2019  . Non-intractable vomiting   . Nausea, vomiting, and diarrhea 05/30/2019  . Hypokalemia 05/30/2019  . Hypomagnesemia 05/30/2019  . Gastritis 05/30/2019  . Elevated lipase 05/30/2019  . SIRS (systemic inflammatory response syndrome) ([place]) 05/30/2019  . History of COPD   . Other chronic pain 04/22/2019  . Dyspnea 08/10/2018  . Acute diastolic heart failure ([place]) 06/15/2018  . Essential hypertension 06/15/2018  . 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 Gastric mass   . Sepsis secondary to UTI ([place])   . Uncontrolled type 2 diabetes mellitus with complication ([place])   . AKI (acute kidney injury) ([place])   . Chronic diastolic CHF (congestive heart failure) ([place])   . E-coli UTI  01/09/2017  . Sepsis ([place]) 01/09/2017  . Constipation 01/09/2017  . COPD (chronic obstructive pulmonary disease) ([place]) 01/09/2017  . Multiple falls 06/14/2015  . Weight loss 06/14/2015  . Acute respiratory failure with hypoxia ([place]) 06/14/2015  . Orthostasis 06/14/2015  . Acute respiratory failure ([place]) 06/14/2015  . Elevated lactic acid level 01/22/2015  . 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 Chronic diastolic heart failure, NYHA class 1 ([place]) 01/22/2015  . Tachycardia with heart rate  100-120 beats per minute 04/08/2014  . DM (diabetes mellitus) ([place]) 04/08/2014  . Chest pain 04/07/2014  . Hyperglycemia 12/11/2012  . Syncope and collapse 12/11/2012  . Dehydration, mild 12/11/2012  . Noncompliance 12/11/2012  . UTI (lower urinary tract infection) 12/11/2012  . 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 Rheumatoid arthritis ([place]) 12/11/2012    Past Surgical History:  Procedure Laterality Date  . APPENDECTOMY  1970   Archie Endo 12/12/2010  . BACK SURGERY    . BIOPSY  05/25/2019   Procedure: BIOPSY;  Surgeon: Rush Landmark Telford Nab., MD;  Location: [place];  Service: Gastroenterology;;  . [place]   Archie Endo 12/12/2010  . ESOPHAGOGASTRODUODENOSCOPY N/A 05/25/2019   Procedure: ESOPHAGOGASTRODUODENOSCOPY (EGD);  Surgeon: Irving Copas., MD;  Location: [place];  Service: Gastroenterology;  Laterality: N/A;  . EUS N/A 10/16/2017   Procedure: UPPER ENDOSCOPIC ULTRASOUND (EUS) RADIAL;  Surgeon: Milus Banister, MD;  Location: [facility];  Service: Endoscopy;  Laterality: N/A;  . FRACTURE SURGERY    . LAPAROSCOPIC CHOLECYSTECTOMY  1990's  . LUMBAR MICRODISCECTOMY Left 09/2006   L5-S1/notes 12/12/2010  . MULTIPLE EXTRACTIONS WITH ALVEOLOPLASTY Bilateral 06/13/2017   Procedure: MULTIPLE EXTRACTION WITH ALVEOLOPLASTY;  Surgeon: Diona Browner, DDS;  Location: [place];  Service: Oral Surgery;  Laterality: Bilateral;  . MULTIPLE TOOTH EXTRACTIONS    . ORIF SHOULDER FRACTURE Left ~ 2009   "put cement down in it"   . TONSILLECTOMY  1950's?  . TOTAL ABDOMINAL HYSTERECTOMY  1979   Archie Endo 12/12/2010     OB History   No obstetric history on file.     Family History  Problem Relation Age of Onset  . Hypertension Mother   . Heart attack Father   . Diabetes Mellitus II Sister   . Hypertension Sister   . Cancer - Other Sister   . Migraines Niece   . Colon cancer Neg Hx     Social History   Tobacco Use  . Smoking  status: Former Smoker    Types: Cigarettes  . Smokeless tobacco: Never Used  . Tobacco comment: 05/16/2017"1 pack of cigarettes would last 3-4 months; haven't had any cigarettes for 40 years"  Substance Use Topics  . Alcohol use: Not Currently    Comment: 05/16/2017 "used to drink beer in the 1990's; don't drink at all now"  . Drug use: No    Home Medications Prior to Admission medications   Medication Sig Start Date End Date Taking? Authorizing Provider  albuterol (ACCUNEB) 0.63 MG/3ML nebulizer solution Take 2 ampules by nebulization every 6 (six) hours as needed for wheezing.   Yes [provider]  aspirin EC 81 MG tablet Take 1 tablet (81 mg total) by mouth every Monday, Wednesday, and Friday. 08/04/19  Yes Eulogio Bear U, DO  atorvastatin (LIPITOR) 10 MG tablet Take 1 Tablet by mouth daily Patient taking differently: Take 10 mg by mouth daily at 6 PM.  07/07/19  Yes Rodriguez-Southworth, Sunday Spillers, PA-C  cephALEXin (KEFLEX) 500 MG capsule Take 1 capsule (  500 mg total) by mouth 4 (four) times daily for 10 days. 08/13/19 08/23/19 Yes Burky, Malachy Moan, NP  docusate sodium (COLACE) 100 MG capsule Take 1 capsule (100 mg total) by mouth 2 (two) times daily. 08/03/19  Yes Vann, Jessica U, DO  hydrOXYzine (ATARAX/VISTARIL) 10 MG tablet 1 q 8h prn itching Patient taking differently: Take 10 mg by mouth every 8 (eight) hours as needed for itching.  04/22/19  Yes Rodriguez-Southworth, Sunday Spillers, PA-C  lisinopril-hydrochlorothiazide (ZESTORETIC) 10-12.5 MG tablet Take 1 tablet by mouth daily.   Yes [provider]  methylPREDNISolone (MEDROL) 4 MG tablet Take 4 mg by mouth daily.   Yes Rosita Kea, PA-C  metoCLOPramide (REGLAN) 5 MG tablet Take 5 mg by mouth 3 (three) times daily before meals.   Yes [provider]  mometasone-formoterol (DULERA) 200-5 MCG/ACT AERO Inhale 2 puffs into the lungs 2 (two) times daily.   Yes [provider]  omeprazole (PRILOSEC) 20 MG capsule  Take 20 mg by mouth daily.   Yes [provider]  ondansetron (ZOFRAN ODT) 4 MG disintegrating tablet Take 1 tablet (4 mg total) by mouth every 8 (eight) hours as needed for nausea or vomiting. 06/10/19  Yes Rodriguez-Southworth, Sunday Spillers, PA-C  pantoprazole (PROTONIX) 40 MG tablet Take 1 tablet (40 mg total) by mouth daily. 08/03/19 10/02/19 Yes Vann, Jessica U, DO  phenazopyridine (PYRIDIUM) 200 MG tablet Take 1 tablet (200 mg total) by mouth 3 (three) times daily as needed for pain. 08/11/19  Yes Minette Brine, FNP  simethicone (GAS-X EXTRA STRENGTH) 125 MG chewable tablet Chew 125 mg by mouth every 6 (six) hours as needed for flatulence.   Yes [provider]  traMADol (ULTRAM) 50 MG tablet Take 1 tablet (50 mg total) by mouth every 6 (six) hours as needed. 07/15/19  Yes Rodriguez-Southworth, Sunday Spillers, PA-C  glucose blood (ONETOUCH VERIO) test strip Use to check blood sugar 3 to 4 times a day before meals Patient not taking: Reported on 08/11/2019 03/25/19   Rodriguez-Southworth, Sunday Spillers, PA-C  magnesium chloride (SLOW-MAG) 64 MG TBEC SR tablet Take 1 tablet (64 mg total) by mouth 2 (two) times daily. 08/13/19   Quintella Reichert, MD    Allergies    Patient has no known allergies.  Review of Systems   Review of Systems  All other systems reviewed and are negative.   Physical Exam Updated Vital Signs BP (!) 175/84 (BP Location: Right Arm)   Pulse (!) 118   Temp 98.2 F (36.8 C) (Oral)   Resp (!) 21   Ht 5\' 5"  (1.651 m)   Wt 54.4 kg   SpO2 94%   BMI 19.97 kg/m   Physical Exam Vitals and nursing note reviewed.  Constitutional:      Appearance: She is well-developed.  HENT:     Head: Normocephalic and atraumatic.  Cardiovascular:     Rate and Rhythm: Regular rhythm. Tachycardia present.     Heart sounds: No murmur.  Pulmonary:     Effort: Pulmonary effort is normal. No respiratory distress.     Breath sounds: Normal breath sounds.  Abdominal:     Palpations: Abdomen is  soft.     Tenderness: There is no abdominal tenderness. There is no guarding or rebound.  Musculoskeletal:        General: No swelling or tenderness.  Skin:    General: Skin is warm and dry.  Neurological:     Mental Status: She is alert and oriented to person, place, and  time.  Psychiatric:        Behavior: Behavior normal.     ED Results / Procedures / Treatments   Labs (all labs ordered are listed, but only abnormal results are displayed) Labs Reviewed  MAGNESIUM - Abnormal; Notable for the following components:      Result Value   Magnesium 1.0 (*)    All other components within normal limits  BASIC METABOLIC PANEL - Abnormal; Notable for the following components:   Potassium 2.8 (*)    CO2 21 (*)    Glucose, Bld 128 (*)    BUN 6 (*)    Creatinine, Ser 1.06 (*)    Calcium 8.4 (*)    GFR calc non Af Amer 49 (*)    GFR calc Af Amer 57 (*)    All other components within normal limits  CBC - Abnormal; Notable for the following components:   RBC 3.61 (*)    Hemoglobin 10.5 (*)    HCT 33.1 (*)    RDW 16.7 (*)    All other components within normal limits  LACTIC ACID, PLASMA - Abnormal; Notable for the following components:   Lactic Acid, Venous 2.0 (*)    All other components within normal limits  URINALYSIS, ROUTINE W REFLEX MICROSCOPIC  LACTIC ACID, PLASMA    EKG EKG Interpretation  Date/Time:  Friday August 13 2019 21:48:52 EST Ventricular Rate:  114 PR Interval:    QRS Duration: 79 QT Interval:  340 QTC Calculation: 469 R Axis:   1 Text Interpretation: Sinus tachycardia Confirmed by Quintella Reichert [phone]) on 08/13/2019 10:38:15 PM   Radiology CT Renal Stone Study  Result Date: 08/13/2019 CLINICAL DATA:  Dysuria. Acute kidney injury. EXAM: CT ABDOMEN AND PELVIS WITHOUT CONTRAST TECHNIQUE: Multidetector CT imaging of the abdomen and pelvis was performed following the standard protocol without IV contrast. COMPARISON:  CT 05/19/2019 FINDINGS: Lower chest:  Linear atelectasis in the right lower lobe. No pleural fluid. Hepatobiliary: Post cholecystectomy. No evidence of focal lesion. No biliary dilatation. Pancreas: No ductal dilatation or inflammation. Spleen: Normal in size without focal abnormality. Adrenals/Urinary Tract: No adrenal nodule. No renal stones or hydronephrosis. No perinephric edema. No evidence of ureteral calculi. Urinary bladder is physiologically distended. No bladder wall thickening. No bladder stone. Stomach/Bowel: Densely calcified lesion abutting the lesser curvature of the stomach, unchanged from prior exams. Stomach is distended with ingested material. No small bowel obstruction or inflammation. Appendix not confidently visualized. Small volume of stool in the colon. Colonic interposition under the right hemidiaphragm. No colonic wall thickening or inflammatory change. Vascular/Lymphatic: Moderate aorto bi-iliac atherosclerosis. No aortic aneurysm. No bulky abdominopelvic adenopathy. Reproductive: Post hysterectomy. No adnexal mass. Other: No free air, free fluid, or intra-abdominal fluid collection. Musculoskeletal: Chronic compression deformities in the lower thoracic and lumbar spine, unchanged from prior exam. Vertebral augmentation of T11 and T12. No acute osseous abnormality. IMPRESSION: 1. No renal stones, obstructive uropathy, or explanation for dysuria and acute kidney injury. 2. No acute findings in the abdomen/pelvis. 3. Chronic calcified lesion arising from the lesser curvature of the stomach, stable from prior exams and presumably benign. Electronically Signed   By: Keith Rake M.D.   On: 08/13/2019 23:14    Procedures Procedures (including critical care time)  Medications Ordered in ED Medications  acetaminophen (TYLENOL) tablet 650 mg (650 mg Oral Not Given 08/13/19 2211)  magnesium sulfate IVPB 2 g 50 mL (2 g Intravenous New Bag/Given 08/13/19 2320)  cefTRIAXone (ROCEPHIN) 1 g in sodium chloride  0.9 % 100 mL IVPB  (has no administration in time range)  lactated ringers bolus 1,000 mL (1,000 mLs Intravenous New Bag/Given 08/13/19 2201)  potassium chloride 20 MEQ/15ML (10%) solution 40 mEq (40 mEq Oral Given 08/13/19 2320)    ED Course  I have reviewed the triage vital signs and the nursing notes.  Pertinent labs & imaging results that were available during my care of the patient were reviewed by me and considered in my medical decision making (see chart for details).    MDM Rules/Calculators/A&P                     Patient referred to the emergency department for hypokalemia on labs obtained at urgent care today. Labs demonstrate hypokalemia, hypomagnesemia. Patient is tachycardic but non-toxic on evaluation. She received antibiotics prior to ED arrival. Presentation is not consistent with sepsis. Patient states that she has baseline tachycardia, and on record review this does appear to be the case. Her heart rate in the emergency department on evaluation at the bedside is between 100 and 110. She has no systemic symptoms, vomiting. Discussed with patient home care for UTI, hypokalemia as well as hypo magnesemia. Discussed outpatient follow-up and return precautions. Findings of studies were also discussed with the patient's nephew, Gwenlyn Perking over the phone.  Final Clinical Impression(s) / ED Diagnoses Final diagnoses:  Hypokalemia  Hypomagnesemia  Acute UTI    Rx / DC Orders ED Discharge Orders         Ordered    magnesium chloride (SLOW-MAG) 64 MG TBEC SR tablet  2 times daily     08/13/19 2350           Quintella Reichert, MD 08/14/19 0002

## 2019-08-13 NOTE — ED Notes (Signed)
Patient unable to provide urine specimen

## 2019-08-13 NOTE — Discharge Instructions (Signed)
Continue your antibiotics (keflex) as prescribed.  Start taking the magnesium and potassium supplement.    Get rechecked immediately if you develop fevers, vomiting, or can't urinate.    Please follow up with your doctor next week to have your kidney function rechecked.

## 2019-08-13 NOTE — ED Notes (Signed)
Pt vital signs reported to Provider. [name].

## 2019-08-13 NOTE — ED Notes (Signed)
[phone] pts nephew [place], let pt know he called if she can call him

## 2019-08-13 NOTE — ED Triage Notes (Signed)
Pt bib ems, reporting that she has an elevated K+. Pt also complaining of slight chest pressure and generalized weakness.  Urinary retention  3x days. Went to urgent care today and was sent here.   160/78 Hr 120 rr16 spo2 95% cbg 125 98.2 temp

## 2019-08-13 NOTE — Telephone Encounter (Signed)
PT called to be made aware of low potassium result and BUN / CRT results. Per Augusto Gamble, NP she is instructed to go to ER tonight for evaluation. Patient understands and agrees and will pack and leave shortly. She plans to call EMS for transport.

## 2019-08-13 NOTE — ED Provider Notes (Signed)
[place]    CSN: [id number] Arrival date & time: 08/13/19  1356      History   Chief Complaint Chief Complaint  Patient presents with  . Abdominal Pain    HPI [name] is a 84 y.o. female.   [name] presents with complaints of persistent urinary symptoms. Was hospitalized with cystitis 1/2-1/5, with previous hospitalization as well. Was found to be hypokalemic, as well as with AKI at that time. She received rocephin as well as bactrim. Completed bactrim at home. As soon as this was completed,  her symptoms returned. Difficulty urinating. Did a video visit 1/13 and was given pyridium. She has been able to urinate more easily but now very painful with urination, low back pain, and pelvic pain. No known specific fevers. No nausea or vomiting. Normal bowel movements. Has been drinking increased water. History  Of COPD, uses inhalers which do help manage her chronic Shortness of breath. Her urine culture from hospitalization resulted with  ENTEROBACTER AEROGENES . Has had frequent hospitalizations, per chart review, also in hospital in December as well as in October.     ROS per HPI, negative if not otherwise mentioned.      Past Medical History:  Diagnosis Date  . Allergy   . Anemia    "when I was young"  . Anginal pain ([place])   . Anxiety   . Arthritis    "all over my body"   . Chest pain 04/07/2014  . Chronic diastolic heart failure, NYHA class 1 ([place]) 01/22/2015  . Chronic lower back pain   . Chronic steroid use 12/11/2012  . COPD (chronic obstructive pulmonary disease) ([place]) 01/09/2017  . 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 Elevated lactic acid level 01/22/2015  . Essential hypertension 06/15/2018  . Frequent UTI    "recently" (06/14/2015)  . Gastric mass   . GERD (gastroesophageal reflux disease)   . Headache(784.0)    "weekly; wake up w/them " (110/19/2018)  . Heart murmur   . High cholesterol   . History  of blood transfusion ~ 1954   "related to my periods"  . Migraine    "once/month maybe" (05/16/2017)  . Multiple falls 06/14/2015  . Noncompliance 12/11/2012  . OSA (obstructive sleep apnea)    "I think I'm using a BiPAP" (06/14/2015)  . Sepsis ([place]) 01/09/2017  . Syncope and collapse 12/11/2012  . Tachycardia with heart rate 100-120 beats per minute 04/08/2014  . Type II diabetes mellitus ([place])   . Uncontrolled type 2 diabetes mellitus with complication [facility])     Patient Active Problem List   Diagnosis Date Noted  . DNR (do not resuscitate) 08/02/2019  . Dehydration 08/01/2019  . Urinary tract infection symptoms 07/31/2019  . Diabetic gastroparesis ([place]) 07/31/2019  . Unexplained weight loss 07/12/2019  . Non-intractable vomiting   . Nausea, vomiting, and diarrhea 05/30/2019  . Hypokalemia 05/30/2019  . Hypomagnesemia 05/30/2019  . Gastritis 05/30/2019  . Elevated lipase 05/30/2019  . SIRS (systemic inflammatory response syndrome) ([place]) 05/30/2019  . History of COPD   . Other chronic pain 04/22/2019  . Dyspnea 08/10/2018  . Acute diastolic heart failure ([place]) 06/15/2018  . Essential hypertension 06/15/2018  . 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 Gastric mass   . Sepsis secondary to UTI ([place])   . Uncontrolled type 2 diabetes mellitus with complication ([place])   . AKI (acute kidney injury) ([place])   . Chronic diastolic CHF (congestive heart failure) ([place])   .  E-coli UTI 01/09/2017  . Sepsis ([place]) 01/09/2017  . Constipation 01/09/2017  . COPD (chronic obstructive pulmonary disease) ([place]) 01/09/2017  . Multiple falls 06/14/2015  . Weight loss 06/14/2015  . Acute respiratory failure with hypoxia ([place]) 06/14/2015  . Orthostasis 06/14/2015  . Acute respiratory failure ([place]) 06/14/2015  . Elevated lactic acid level 01/22/2015  . 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 Chronic diastolic heart failure, NYHA class 1 ([place]) 01/22/2015  . Tachycardia with heart rate 100-120 beats per minute  04/08/2014  . DM (diabetes mellitus) ([place]) 04/08/2014  . Chest pain 04/07/2014  . Hyperglycemia 12/11/2012  . Syncope and collapse 12/11/2012  . Dehydration, mild 12/11/2012  . Noncompliance 12/11/2012  . UTI (lower urinary tract infection) 12/11/2012  . 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 Rheumatoid arthritis ([place]) 12/11/2012    Past Surgical History:  Procedure Laterality Date  . APPENDECTOMY  1970   Archie Endo 12/12/2010  . BACK SURGERY    . BIOPSY  05/25/2019   Procedure: BIOPSY;  Surgeon: Rush Landmark Telford Nab., MD;  Location: [place];  Service: Gastroenterology;;  . [place]   Archie Endo 12/12/2010  . ESOPHAGOGASTRODUODENOSCOPY N/A 05/25/2019   Procedure: ESOPHAGOGASTRODUODENOSCOPY (EGD);  Surgeon: Irving Copas., MD;  Location: [place];  Service: Gastroenterology;  Laterality: N/A;  . EUS N/A 10/16/2017   Procedure: UPPER ENDOSCOPIC ULTRASOUND (EUS) RADIAL;  Surgeon: Milus Banister, MD;  Location: [facility];  Service: Endoscopy;  Laterality: N/A;  . FRACTURE SURGERY    . LAPAROSCOPIC CHOLECYSTECTOMY  1990's  . LUMBAR MICRODISCECTOMY Left 09/2006   L5-S1/notes 12/12/2010  . MULTIPLE EXTRACTIONS WITH ALVEOLOPLASTY Bilateral 06/13/2017   Procedure: MULTIPLE EXTRACTION WITH ALVEOLOPLASTY;  Surgeon: Diona Browner, DDS;  Location: [place];  Service: Oral Surgery;  Laterality: Bilateral;  . MULTIPLE TOOTH EXTRACTIONS    . ORIF SHOULDER FRACTURE Left ~ 2009   "put cement down in it"   . TONSILLECTOMY  1950's?  . TOTAL ABDOMINAL HYSTERECTOMY  1979   Archie Endo 12/12/2010    OB History   No obstetric history on file.      Home Medications    Prior to Admission medications   Medication Sig Start Date End Date Taking? Authorizing Provider  albuterol (ACCUNEB) 0.63 MG/3ML nebulizer solution Take 2 ampules by nebulization every 6 (six) hours as needed for wheezing.    [provider]  aspirin EC 81 MG tablet Take  1 tablet (81 mg total) by mouth every Monday, Wednesday, and Friday. 08/04/19   Geradine Girt, DO  atorvastatin (LIPITOR) 10 MG tablet Take 1 Tablet by mouth daily Patient taking differently: Take 10 mg by mouth daily at 6 PM.  07/07/19   Rodriguez-Southworth, Sunday Spillers, PA-C  cefUROXime (CEFTIN) 500 MG tablet Take 500 mg by mouth 2 (two) times daily with a meal.    [provider]  cephALEXin (KEFLEX) 500 MG capsule Take 1 capsule (500 mg total) by mouth 4 (four) times daily for 10 days. 08/13/19 08/23/19  Zigmund Gottron, NP  dicyclomine (BENTYL) 20 MG tablet Take 20 mg by mouth 2 (two) times daily. 10/13/17   [provider]  docusate sodium (COLACE) 100 MG capsule Take 1 capsule (100 mg total) by mouth 2 (two) times daily. 08/03/19   Geradine Girt, DO  glucose blood (ONETOUCH VERIO) test strip Use to check blood sugar 3 to 4 times a day before meals Patient not taking: Reported on 08/11/2019 03/25/19   Rodriguez-Southworth, Sunday Spillers, PA-C  hydroxychloroquine (PLAQUENIL)  200 MG tablet Take 200 mg by mouth 2 (two) times daily.    [provider]  hydrOXYzine (ATARAX/VISTARIL) 10 MG tablet 1 q 8h prn itching Patient taking differently: Take 10 mg by mouth every 8 (eight) hours as needed for itching.  04/22/19   Rodriguez-Southworth, Sunday Spillers, PA-C  lisinopril-hydrochlorothiazide (ZESTORETIC) 10-12.5 MG tablet Take 1 tablet by mouth daily.    [provider]  MAGNESIUM-OXIDE 400 (241.3 Mg) MG tablet Take 1 tablet by mouth daily. 06/11/19   [provider]  methylPREDNISolone (MEDROL) 4 MG tablet Take 4 mg by mouth daily.    Rosita Kea, PA-C  metoCLOPramide (REGLAN) 5 MG tablet Take 5 mg by mouth 3 (three) times daily before meals.    [provider]  omeprazole (PRILOSEC) 20 MG capsule Take 20 mg by mouth daily.    [provider]  ondansetron (ZOFRAN ODT) 4 MG disintegrating tablet Take 1 tablet (4 mg total) by mouth every 8 (eight) hours as  needed for nausea or vomiting. 06/10/19   Rodriguez-Southworth, Sunday Spillers, PA-C  pantoprazole (PROTONIX) 40 MG tablet Take 1 tablet (40 mg total) by mouth daily. Patient taking differently: Take 40 mg by mouth 2 (two) times daily. For 6 weeks 08/03/19 10/02/19  Geradine Girt, DO  phenazopyridine (PYRIDIUM) 200 MG tablet Take 1 tablet (200 mg total) by mouth 3 (three) times daily as needed for pain. 08/11/19   Minette Brine, FNP  promethazine (PHENERGAN) 25 MG tablet Take 1 tablet (25 mg total) by mouth every 6 (six) hours as needed for nausea or vomiting. Patient not taking: Reported on 08/11/2019 06/17/19   Rodriguez-Southworth, Sunday Spillers, PA-C  simethicone (GAS-X EXTRA STRENGTH) 125 MG chewable tablet Chew 125 mg by mouth every 6 (six) hours as needed for flatulence.    [provider]  sucralfate (CARAFATE) 1 GM/10ML suspension Take 10 mLs (1 g total) by mouth 4 (four) times daily -  with meals and at bedtime. Patient not taking: Reported on 08/11/2019 05/26/19   Kayleen Memos, DO  SYMBICORT 160-4.5 MCG/ACT inhaler Inhale 2 puffs into the lungs 2 (two) times daily. 07/26/19   [provider]  traMADol (ULTRAM) 50 MG tablet Take 1 tablet (50 mg total) by mouth every 6 (six) hours as needed. Patient not taking: Reported on 08/11/2019 07/15/19   Rodriguez-Southworth, Sunday Spillers, PA-C    Family History Family History  Problem Relation Age of Onset  . Hypertension Mother   . Heart attack Father   . Diabetes Mellitus II Sister   . Hypertension Sister   . Cancer - Other Sister   . Migraines Niece   . Colon cancer Neg Hx     Social History Social History   Tobacco Use  . Smoking status: Former Smoker    Types: Cigarettes  . Smokeless tobacco: Never Used  . Tobacco comment: 05/16/2017"1 pack of cigarettes would last 3-4 months; haven't had any cigarettes for 40 years"  Substance Use Topics  . Alcohol use: Not Currently    Comment: 05/16/2017 "used to drink beer in the 1990's; don't  drink at all now"  . Drug use: No     Allergies   Patient has no known allergies.   Review of Systems Review of Systems   Physical Exam Triage Vital Signs ED Triage Vitals  Enc Vitals Group     BP 08/13/19 1500 (!) 162/93     Pulse Rate 08/13/19 1500 (!) 113     Resp 08/13/19 1500 18  Temp 08/13/19 1500 98.7 F (37.1 C)     Temp Source 08/13/19 1500 Oral     SpO2 08/13/19 1500 93 %     Weight --      Height --      Head Circumference --      Peak Flow --      Pain Score 08/13/19 1457 10     Pain Loc --      Pain Edu? --      Excl. in [place]? --    No data found.  Updated Vital Signs BP (!) 162/93 (BP Location: Left Arm)   Pulse (!) 113   Temp 98.7 F (37.1 C) (Oral)   Resp 18   SpO2 93%    Physical Exam Constitutional:      General: She is not in acute distress.    Appearance: She is well-developed.  Cardiovascular:     Rate and Rhythm: Tachycardia present.  Pulmonary:     Effort: Pulmonary effort is normal.  Abdominal:     Tenderness: There is abdominal tenderness in the suprapubic area. There is no right CVA tenderness or left CVA tenderness.     Comments: Low back pain without specific cva tenderness   Skin:    General: Skin is warm and dry.  Neurological:     Mental Status: She is alert and oriented to person, place, and time.      UC Treatments / Results  Labs (all labs ordered are listed, but only abnormal results are displayed) Labs Reviewed  COMPREHENSIVE METABOLIC PANEL - Abnormal; Notable for the following components:      Result Value   Potassium 2.9 (*)    Glucose, Bld 146 (*)    BUN 6 (*)    Creatinine, Ser 1.15 (*)    Calcium 8.7 (*)    GFR calc non Af Amer 45 (*)    GFR calc Af Amer 52 (*)    All other components within normal limits  CBC - Abnormal; Notable for the following components:   WBC 10.6 (*)    RBC 3.86 (*)    Hemoglobin 10.9 (*)    HCT 35.1 (*)    RDW 16.8 (*)    Platelets 425 (*)    All other components  within normal limits  POCT URINALYSIS DIP (DEVICE) - Abnormal; Notable for the following components:   Glucose, UA 100 (*)    Bilirubin Urine SMALL (*)    Ketones, ur TRACE (*)    Hgb urine dipstick SMALL (*)    Protein, ur 100 (*)    Urobilinogen, UA 2.0 (*)    Nitrite POSITIVE (*)    Leukocytes,Ua LARGE (*)    All other components within normal limits  URINE CULTURE    EKG   Radiology No results found.  Procedures Procedures (including critical care time)  Medications Ordered in UC Medications  cefTRIAXone (ROCEPHIN) injection 1 g (1 g Intramuscular Given 08/13/19 1608)    Initial Impression / Assessment and Plan / UC Course  I have reviewed the triage vital signs and the nursing notes.  Pertinent labs & imaging results that were available during my care of the patient were reviewed by me and considered in my medical decision making (see chart for details).     Tachycardia noted with leukocytes and nitrite to urine with persistent urine symptoms. IM rocephin provided with oral keflex. Repeat labs collected today and pending. With resulting labs noted hypokalemia and AKI again noted. Patient  called by nursing with instruct to go to the ER. Patient verbalized understanding and agreeable to plan.   Final Clinical Impressions(s) / UC Diagnoses   Final diagnoses:  Lower urinary tract infectious disease     Discharge Instructions     Your urine is still consistent with UTI.  We will give you an injection of antibiotics here today, followed by 10 days of oral antibiotics.  Drink plenty of water to empty bladder regularly. Avoid alcohol and caffeine as these may irritate the bladder.   I will call you if there are any concerning findings with your blood testing.  Please follow up with your primary care provider in the next week for recheck.  Any worsening of symptoms- pain, fevers, inability to urinate, weakness, or otherwise worsening please go to the ER.     ED  Prescriptions    Medication Sig Dispense Auth. Provider   cephALEXin (KEFLEX) 500 MG capsule Take 1 capsule (500 mg total) by mouth 4 (four) times daily for 10 days. 40 capsule Zigmund Gottron, NP     PDMP not reviewed this encounter.   Zigmund Gottron, NP 08/13/19 2027

## 2019-08-14 DIAGNOSIS — E876 Hypokalemia: Secondary | ICD-10-CM | POA: Diagnosis not present

## 2019-08-14 LAB — URINE CULTURE

## 2019-08-14 LAB — URINALYSIS, ROUTINE W REFLEX MICROSCOPIC
Bilirubin Urine: NEGATIVE
Glucose, UA: NEGATIVE mg/dL
Hgb urine dipstick: NEGATIVE
Ketones, ur: NEGATIVE mg/dL
Leukocytes,Ua: NEGATIVE
Nitrite: POSITIVE — AB
Protein, ur: NEGATIVE mg/dL
Specific Gravity, Urine: 1.005 (ref 1.005–1.030)
pH: 6 (ref 5.0–8.0)

## 2019-08-14 LAB — LACTIC ACID, PLASMA: Lactic Acid, Venous: 2 mmol/L (ref 0.5–1.9)

## 2019-08-14 NOTE — ED Notes (Signed)
Discharge instructions discussed with pt. Pt verbalized understanding with no questions at this time. Pt to go home with nephew  L[place].

## 2019-08-23 ENCOUNTER — Encounter: Payer: Self-pay | Admitting: Gastroenterology

## 2019-08-23 ENCOUNTER — Ambulatory Visit (INDEPENDENT_AMBULATORY_CARE_PROVIDER_SITE_OTHER): Payer: Medicare Other | Admitting: Gastroenterology

## 2019-08-23 VITALS — BP 140/72 | HR 68 | Temp 97.8°F | Ht 65.0 in | Wt 113.1 lb

## 2019-08-23 DIAGNOSIS — R110 Nausea: Secondary | ICD-10-CM | POA: Diagnosis not present

## 2019-08-23 DIAGNOSIS — K5900 Constipation, unspecified: Secondary | ICD-10-CM | POA: Diagnosis not present

## 2019-08-23 DIAGNOSIS — K3184 Gastroparesis: Secondary | ICD-10-CM

## 2019-08-23 MED ORDER — METOCLOPRAMIDE HCL 5 MG PO TABS: 5.0000 mg | ORAL_TABLET | Freq: Three times a day (TID) | ORAL | 3 refills | Status: AC

## 2019-08-23 MED ORDER — POLYETHYLENE GLYCOL 3350 17 G PO PACK: 17.0000 g | PACK | Freq: Every day | ORAL | 0 refills | Status: DC

## 2019-08-23 MED ORDER — DICYCLOMINE HCL 10 MG PO CAPS: 10.0000 mg | ORAL_CAPSULE | Freq: Three times a day (TID) | ORAL | 3 refills | Status: AC | PRN

## 2019-08-23 NOTE — Patient Instructions (Addendum)
If you are age 82 or older, your body mass index should be between 23-30. Your Body mass index is 18.82 kg/m. If this is out of the aforementioned range listed, please consider follow up with your Primary Care Provider.  If you are age 65 or younger, your body mass index should be between 19-25. Your Body mass index is 18.82 kg/m. If this is out of the aformentioned range listed, please consider follow up with your Primary Care Provider.   We have sent the following medications to your pharmacy for you to pick up at your convenience: Reglan 5mg : Take three times daily before meals Bentyl 10mg : Take every 8 hours as needed  Please purchase the following medications over the counter and take as directed: Miralax: Take as directed daily  I would like to see you back in the office for a follow up in 4 months.  We will reach out to you when it is time to schedule an appointment.   Thank you for entrusting me with your care and for choosing [facility], Dr. [place] Cellar

## 2019-08-23 NOTE — Progress Notes (Signed)
HPI :  84 year old female here for follow-up visit.  She has a history of gastroparesis remotely, and more recently symptoms of persistent nausea and poor p.o. intake.  She has a history of OSA, rheumatoid arthritis, diabetes, GERD.  History of cholecystectomy.  The patient presents by herself to the clinic today.  Please see prior clinic notes for full details of her case.  She was previously admitted in October for worsening dizziness and weakness, nausea vomiting and poor p.o. intake with abdominal pain.  She had imaging at that time without any concerning findings. She underwent an endoscopy with Dr. Rush Landmark on 05/25/19 which showed a stable subepithelial gastric lesion thought to be benign, she is had an extensive work-up for that in the past including EUS.  She had some gastric nodules which were biopsied and negative, negative testing for H. pylori.  She also had negative biopsies of the small bowel and esophagus.  She unfortunately was readmitted to the hospital from October 31 to November 2.  At that time she presented with severe pain diffusely.  It was learned that she had run out of her Protonix and had run out of her Medrol tablets which she takes chronically for her rheumatoid arthritis.  These were resumed and she felt better and was discharged.   I saw her in November 2020.  She continued to feel poorly with early satiety and frequent nausea.  She had not had much benefit with Zofran.  We had reviewed her prior imaging and thought that her symptoms could be due to gastroparesis.  Also it was unclear whether or not she was supposed to be taking her Medrol at home.  She had testing for adrenal insufficiency with a random cortisol which seemed normal.  She has since been on her Medrol dosing on a routine basis.  I provided her a trial of Reglan 5 mg 3 times a day with each meal after discussion of options.  I also gave her some Phenergan to use as needed.  She states these interventions  did help to some extent although her symptoms do persist.  She was admitted in December with some recurrence of symptoms, she states she thinks her Reglan ran out and she had not been taking it for the past month or so at least.  Her symptoms improved during hospitalization she was discharged.  Her course has been recently complicated by a UTI for which she is taking Keflex and issues with hypokalemia.    Her most recent imaging in the ED on the 15th was a renal stone protocol study which did not show any concerning pathology.    Endoscopic history: 2006 colonoscopyby Dr. Vladimir Faster. 2, tubular adenomatous, polyps removed. 08/2017 colonoscopy. Dr Havery Moros.For weight loss and surveillance of adenomatous polyps.Flat,10 mm cecal polyp.three, 4 to 6 mm,sessile polyps in transverse colon. Transverse and sigmoid diverticulosis. Internal hemorrhoids. Pathology on the polyps were adenomatous, no H GD. 2/2019EGD. Subepithelial gastric nodule/mass not bleeding and no stigmata of recent bleeding in the gastric fundus. Biopsied (fundic gland polyp). Small subepithelial nodules in the gastric body, biopsied (gastric, antral and oxyntic mucosa with no specific histopathologic changes. No H. Pylori). 3/2019Upper EUS. For evaluation of gastric nodule:2.3 cm, subepithelial lesioninproximal stomach correlates with densely calcified lesion that appears to involve the wall of the stomach. No evident associated soft tissue involvement. Otherwise normal gastric wall. Limited views of liver, pancreas, spleen all normal. The subepithelial lesion correlates with a heavily calcified lesion in the proximal gastric wall that  has been present by imaging since at least 2007 and has not changed significantly.   EGD 05/25/19 - No gross mucosal lesions in esophagus. Tortuous esophagus. Z-line irregular, 36 cm from the incisors. 1 cm hiatal hernia. - Submucosal lesion in the cardia, previously tunnel  biopsied and EUS performed by partner and present on CT Imaging without significant change for >13-years. - Two submucosal papules (nodules) found in the stomach. Tunnel biopsied the larger of these lesions.. - Erythematous mucosa in the stomach. Biopsied for HP. - No gross lesions in the duodenal bulb, in the first portion of the duodenum and in the second portion of the duodenum. Biopsied for enteropathy rule out.   CT 08/13/19 - renal stone protocol - IMPRESSION: 1. No renal stones, obstructive uropathy, or explanation for dysuria and acute kidney injury. 2. No acute findings in the abdomen/pelvis. 3. Chronic calcified lesion arising from the lesser curvature of the stomach, stable from prior exams and presumably benign.       Past Medical History:  Diagnosis Date  . Allergy   . Anemia    "when I was young"  . Anginal pain ([place])   . Anxiety   . Arthritis    "all over my body"   . Chest pain 04/07/2014  . Chronic diastolic heart failure, NYHA class 1 ([place]) 01/22/2015  . Chronic lower back pain   . Chronic steroid use 12/11/2012  . COPD (chronic obstructive pulmonary disease) ([place]) 01/09/2017  . 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 Elevated lactic acid level 01/22/2015  . Essential hypertension 06/15/2018  . Frequent UTI    "recently" (06/14/2015)  . Gastric mass   . GERD (gastroesophageal reflux disease)   . Headache(784.0)    "weekly; wake up w/them " (110/19/2018)  . Heart murmur   . High cholesterol   . History of blood transfusion ~ 1954   "related to my periods"  . Migraine    "once/month maybe" (05/16/2017)  . Multiple falls 06/14/2015  . Noncompliance 12/11/2012  . OSA (obstructive sleep apnea)    "I think I'm using a BiPAP" (06/14/2015)  . Sepsis ([place]) 01/09/2017  . Syncope and collapse 12/11/2012  . Tachycardia with heart rate 100-120 beats per minute 04/08/2014  . Type II diabetes mellitus ([place])   . Uncontrolled type 2  diabetes mellitus with complication [facility])      Past Surgical History:  Procedure Laterality Date  . APPENDECTOMY  1970   Archie Endo 12/12/2010  . BACK SURGERY    . BIOPSY  05/25/2019   Procedure: BIOPSY;  Surgeon: Rush Landmark Telford Nab., MD;  Location: [place];  Service: Gastroenterology;;  . [place]   Archie Endo 12/12/2010  . ESOPHAGOGASTRODUODENOSCOPY N/A 05/25/2019   Procedure: ESOPHAGOGASTRODUODENOSCOPY (EGD);  Surgeon: Irving Copas., MD;  Location: [place];  Service: Gastroenterology;  Laterality: N/A;  . EUS N/A 10/16/2017   Procedure: UPPER ENDOSCOPIC ULTRASOUND (EUS) RADIAL;  Surgeon: Milus Banister, MD;  Location: [facility];  Service: Endoscopy;  Laterality: N/A;  . FRACTURE SURGERY    . LAPAROSCOPIC CHOLECYSTECTOMY  1990's  . LUMBAR MICRODISCECTOMY Left 09/2006   L5-S1/notes 12/12/2010  . MULTIPLE EXTRACTIONS WITH ALVEOLOPLASTY Bilateral 06/13/2017   Procedure: MULTIPLE EXTRACTION WITH ALVEOLOPLASTY;  Surgeon: Diona Browner, DDS;  Location: [place];  Service: Oral Surgery;  Laterality: Bilateral;  . MULTIPLE TOOTH EXTRACTIONS    . ORIF SHOULDER FRACTURE Left ~ 2009   "put cement down in it"   . TONSILLECTOMY  1950's?  . TOTAL ABDOMINAL HYSTERECTOMY  1979   Archie Endo 12/12/2010   Family History  Problem Relation Age of Onset  . Hypertension Mother   . Heart attack Father   . Diabetes Mellitus II Sister   . Hypertension Sister   . Cancer - Other Sister   . Migraines Niece   . Colon cancer Neg Hx    Social History   Tobacco Use  . Smoking status: Former Smoker    Types: Cigarettes  . Smokeless tobacco: Never Used  . Tobacco comment: 05/16/2017"1 pack of cigarettes would last 3-4 months; haven't had any cigarettes for 40 years"  Substance Use Topics  . Alcohol use: Not Currently    Comment: 05/16/2017 "used to drink beer in the 1990's; don't drink at all now"  . Drug use: No   Current Outpatient Medications  Medication Sig  Dispense Refill  . albuterol (ACCUNEB) 0.63 MG/3ML nebulizer solution Take 2 ampules by nebulization every 6 (six) hours as needed for wheezing.    Marland Kitchen aspirin EC 81 MG tablet Take 1 tablet (81 mg total) by mouth every Monday, Wednesday, and Friday.    Marland Kitchen atorvastatin (LIPITOR) 10 MG tablet Take 1 Tablet by mouth daily (Patient taking differently: Take 10 mg by mouth daily at 6 PM. ) 90 tablet 1  . cephALEXin (KEFLEX) 500 MG capsule Take 1 capsule (500 mg total) by mouth 4 (four) times daily for 10 days. 40 capsule 0  . docusate sodium (COLACE) 100 MG capsule Take 1 capsule (100 mg total) by mouth 2 (two) times daily. 10 capsule 0  . hydrOXYzine (ATARAX/VISTARIL) 10 MG tablet 1 q 8h prn itching (Patient taking differently: Take 10 mg by mouth every 8 (eight) hours as needed for itching. ) 30 tablet 0  . lisinopril-hydrochlorothiazide (ZESTORETIC) 10-12.5 MG tablet Take 1 tablet by mouth daily.    . magnesium chloride (SLOW-MAG) 64 MG TBEC SR tablet Take 1 tablet (64 mg total) by mouth 2 (two) times daily. 14 tablet 0  . methylPREDNISolone (MEDROL) 4 MG tablet Take 4 mg by mouth daily.    . metoCLOPramide (REGLAN) 5 MG tablet Take 5 mg by mouth 3 (three) times daily before meals.    . mometasone-formoterol (DULERA) 200-5 MCG/ACT AERO Inhale 2 puffs into the lungs 2 (two) times daily.    Marland Kitchen omeprazole (PRILOSEC) 20 MG capsule Take 20 mg by mouth daily.    . Potassium Chloride 40 MEQ/15ML (20%) SOLN Take 20 mEq by mouth daily for 10 days. 75 mL 0  . simethicone (GAS-X EXTRA STRENGTH) 125 MG chewable tablet Chew 125 mg by mouth every 6 (six) hours as needed for flatulence.    Marland Kitchen glucose blood (ONETOUCH VERIO) test strip Use to check blood sugar 3 to 4 times a day before meals (Patient not taking: Reported on 08/11/2019) 100 each 12  . ondansetron (ZOFRAN ODT) 4 MG disintegrating tablet Take 1 tablet (4 mg total) by mouth every 8 (eight) hours as needed for nausea or vomiting. (Patient not taking: Reported on  08/23/2019) 30 tablet 0  . pantoprazole (PROTONIX) 40 MG tablet Take 1 tablet (40 mg total) by mouth daily. 60 tablet 0  . traMADol (ULTRAM) 50 MG tablet Take 1 tablet (50 mg total) by mouth every 6 (six) hours as needed. (Patient not taking: Reported on 08/23/2019) 30 tablet 0   No current facility-administered medications for this visit.   No Known Allergies   Review of Systems: All systems reviewed and negative except  where noted in HPI.    CT Renal Stone Study  Result Date: 08/13/2019 CLINICAL DATA:  Dysuria. Acute kidney injury. EXAM: CT ABDOMEN AND PELVIS WITHOUT CONTRAST TECHNIQUE: Multidetector CT imaging of the abdomen and pelvis was performed following the standard protocol without IV contrast. COMPARISON:  CT 05/19/2019 FINDINGS: Lower chest: Linear atelectasis in the right lower lobe. No pleural fluid. Hepatobiliary: Post cholecystectomy. No evidence of focal lesion. No biliary dilatation. Pancreas: No ductal dilatation or inflammation. Spleen: Normal in size without focal abnormality. Adrenals/Urinary Tract: No adrenal nodule. No renal stones or hydronephrosis. No perinephric edema. No evidence of ureteral calculi. Urinary bladder is physiologically distended. No bladder wall thickening. No bladder stone. Stomach/Bowel: Densely calcified lesion abutting the lesser curvature of the stomach, unchanged from prior exams. Stomach is distended with ingested material. No small bowel obstruction or inflammation. Appendix not confidently visualized. Small volume of stool in the colon. Colonic interposition under the right hemidiaphragm. No colonic wall thickening or inflammatory change. Vascular/Lymphatic: Moderate aorto bi-iliac atherosclerosis. No aortic aneurysm. No bulky abdominopelvic adenopathy. Reproductive: Post hysterectomy. No adnexal mass. Other: No free air, free fluid, or intra-abdominal fluid collection. Musculoskeletal: Chronic compression deformities in the lower thoracic and lumbar  spine, unchanged from prior exam. Vertebral augmentation of T11 and T12. No acute osseous abnormality. IMPRESSION: 1. No renal stones, obstructive uropathy, or explanation for dysuria and acute kidney injury. 2. No acute findings in the abdomen/pelvis. 3. Chronic calcified lesion arising from the lesser curvature of the stomach, stable from prior exams and presumably benign. Electronically Signed   By: Keith Rake M.D.   On: 08/13/2019 23:14    Physical Exam: Temp 97.8 F (36.6 C)   Wt 113 lb 2 oz (51.3 kg)   BMI 18.82 kg/m  Constitutional: Pleasant,  female in no acute distress. HEENT: Normocephalic and atraumatic. Conjunctivae are normal. No scleral icterus. Abdominal: Soft, nondistended, nontender. There are no masses palpable. Extremities: no edema Lymphadenopathy: No cervical adenopathy noted. Neurological: Alert and oriented to person place and time. Skin: Skin is warm and dry. No rashes noted. Psychiatric: Normal mood and affect. Behavior is normal.   ASSESSMENT AND PLAN: 84 year old female here for reassessment of the following issues:  Chronic nausea / gastroparesis - patient with chronic nausea, early satiety, poor p.o. intake.  She had a remote gastric emptying study in 2004 showing gastroparesis.  Her symptoms are consistent with this.  She is had negative cross-sectional imaging, negative brain imaging, testing for adrenal insufficiency initially has been negative, she has been taking Medrol lately.  I discussed options with her.  She seems stable since have last seen her.  She does think that Reglan has helped her more than the other antiemetics she has been on, she has been out of it for the past few weeks.  We discussed the medication, risk for tar dive dyskinesia, etc., thought to be low with a low dose she is on.  After discussion of options she wants to resume Reglan 5 mg before meals.  She will stop Zofran as its not providing any benefit.  Her QTC was greater than 450  on last few EKGs, I do not think she is a good candidate for domperidone.  Could consider Motegrity in the future as she does have some mild constipation.  Otherwise reviewed her medication list, she should take the Protonix every day, but not the omeprazole which was prescribed to her in the past.  She will follow up in a few months for reassessment.  If worsening in the interim I asked her to call me.  Constipation - she should continue MiraLAX daily which has helped her, I provided her some Bentyl to use as needed for periodic cramps associated with her constipation.  She can follow-up as needed for this.  I spent 35 minutes of time, including in depth chart review, independent review of results as outlined above, communicating results with the patient directly, face-to-face time with the patient, coordinating care, and ordering studies and medications as appropriate, and documentation.  Soldier Cellar, MD [facility] Gastroenterology

## 2019-08-27 ENCOUNTER — Emergency Department (HOSPITAL_COMMUNITY): Payer: Medicare Other

## 2019-08-27 ENCOUNTER — Other Ambulatory Visit: Payer: Self-pay

## 2019-08-27 ENCOUNTER — Inpatient Hospital Stay (HOSPITAL_COMMUNITY)
Admission: EM | Admit: 2019-08-27 | Discharge: 2019-09-03 | DRG: 641 | Disposition: A | Discharge status: Home Health | Payer: Medicare Other | Attending: Internal Medicine | Admitting: Internal Medicine

## 2019-08-27 ENCOUNTER — Encounter (HOSPITAL_COMMUNITY): Payer: Self-pay

## 2019-08-27 DIAGNOSIS — K219 Gastro-esophageal reflux disease without esophagitis: Secondary | ICD-10-CM | POA: Diagnosis present

## 2019-08-27 DIAGNOSIS — R000 Tachycardia, unspecified: Secondary | ICD-10-CM | POA: Diagnosis present

## 2019-08-27 DIAGNOSIS — Z8249 Family history of ischemic heart disease and other diseases of the circulatory system: Secondary | ICD-10-CM

## 2019-08-27 DIAGNOSIS — E1143 Type 2 diabetes mellitus with diabetic autonomic (poly)neuropathy: Secondary | ICD-10-CM | POA: Diagnosis present

## 2019-08-27 DIAGNOSIS — M05741 Rheumatoid arthritis with rheumatoid factor of right hand without organ or systems involvement: Secondary | ICD-10-CM

## 2019-08-27 DIAGNOSIS — Z87891 Personal history of nicotine dependence: Secondary | ICD-10-CM

## 2019-08-27 DIAGNOSIS — K3184 Gastroparesis: Secondary | ICD-10-CM | POA: Diagnosis present

## 2019-08-27 DIAGNOSIS — R55 Syncope and collapse: Secondary | ICD-10-CM

## 2019-08-27 DIAGNOSIS — Z20822 Contact with and (suspected) exposure to covid-19: Secondary | ICD-10-CM | POA: Diagnosis present

## 2019-08-27 DIAGNOSIS — Z7952 Long term (current) use of systemic steroids: Secondary | ICD-10-CM

## 2019-08-27 DIAGNOSIS — R52 Pain, unspecified: Secondary | ICD-10-CM

## 2019-08-27 DIAGNOSIS — Z90710 Acquired absence of both cervix and uterus: Secondary | ICD-10-CM

## 2019-08-27 DIAGNOSIS — E1122 Type 2 diabetes mellitus with diabetic chronic kidney disease: Secondary | ICD-10-CM

## 2019-08-27 DIAGNOSIS — E785 Hyperlipidemia, unspecified: Secondary | ICD-10-CM | POA: Diagnosis present

## 2019-08-27 DIAGNOSIS — E860 Dehydration: Principal | ICD-10-CM | POA: Diagnosis present

## 2019-08-27 DIAGNOSIS — I5032 Chronic diastolic (congestive) heart failure: Secondary | ICD-10-CM | POA: Diagnosis present

## 2019-08-27 DIAGNOSIS — I10 Essential (primary) hypertension: Secondary | ICD-10-CM | POA: Diagnosis present

## 2019-08-27 DIAGNOSIS — N182 Chronic kidney disease, stage 2 (mild): Secondary | ICD-10-CM

## 2019-08-27 DIAGNOSIS — Z79899 Other long term (current) drug therapy: Secondary | ICD-10-CM

## 2019-08-27 DIAGNOSIS — Z833 Family history of diabetes mellitus: Secondary | ICD-10-CM

## 2019-08-27 DIAGNOSIS — R636 Underweight: Secondary | ICD-10-CM | POA: Diagnosis present

## 2019-08-27 DIAGNOSIS — R627 Adult failure to thrive: Secondary | ICD-10-CM | POA: Diagnosis present

## 2019-08-27 DIAGNOSIS — E119 Type 2 diabetes mellitus without complications: Secondary | ICD-10-CM

## 2019-08-27 DIAGNOSIS — J449 Chronic obstructive pulmonary disease, unspecified: Secondary | ICD-10-CM | POA: Diagnosis present

## 2019-08-27 DIAGNOSIS — Z7951 Long term (current) use of inhaled steroids: Secondary | ICD-10-CM

## 2019-08-27 DIAGNOSIS — M069 Rheumatoid arthritis, unspecified: Secondary | ICD-10-CM | POA: Diagnosis present

## 2019-08-27 DIAGNOSIS — M05742 Rheumatoid arthritis with rheumatoid factor of left hand without organ or systems involvement: Secondary | ICD-10-CM

## 2019-08-27 DIAGNOSIS — Z66 Do not resuscitate: Secondary | ICD-10-CM | POA: Diagnosis present

## 2019-08-27 DIAGNOSIS — E1151 Type 2 diabetes mellitus with diabetic peripheral angiopathy without gangrene: Secondary | ICD-10-CM | POA: Diagnosis present

## 2019-08-27 DIAGNOSIS — E876 Hypokalemia: Secondary | ICD-10-CM | POA: Diagnosis present

## 2019-08-27 DIAGNOSIS — I110 Hypertensive heart disease with heart failure: Secondary | ICD-10-CM | POA: Diagnosis present

## 2019-08-27 DIAGNOSIS — Z7982 Long term (current) use of aspirin: Secondary | ICD-10-CM

## 2019-08-27 DIAGNOSIS — Z681 Body mass index (BMI) 19 or less, adult: Secondary | ICD-10-CM

## 2019-08-27 DIAGNOSIS — E7800 Pure hypercholesterolemia, unspecified: Secondary | ICD-10-CM | POA: Diagnosis present

## 2019-08-27 HISTORY — DX: Syncope and collapse: R55

## 2019-08-27 LAB — DIFFERENTIAL
Abs Immature Granulocytes: 0.05 10*3/uL (ref 0.00–0.07)
Basophils Absolute: 0 10*3/uL (ref 0.0–0.1)
Basophils Relative: 0 %
Eosinophils Absolute: 0 10*3/uL (ref 0.0–0.5)
Eosinophils Relative: 0 %
Immature Granulocytes: 1 %
Lymphocytes Relative: 20 %
Lymphs Abs: 1.9 10*3/uL (ref 0.7–4.0)
Monocytes Absolute: 0.6 10*3/uL (ref 0.1–1.0)
Monocytes Relative: 7 %
Neutro Abs: 6.8 10*3/uL (ref 1.7–7.7)
Neutrophils Relative %: 72 %

## 2019-08-27 LAB — URINALYSIS, ROUTINE W REFLEX MICROSCOPIC
Bacteria, UA: NONE SEEN
Bilirubin Urine: NEGATIVE
Glucose, UA: NEGATIVE mg/dL
Hgb urine dipstick: NEGATIVE
Ketones, ur: 5 mg/dL — AB
Nitrite: NEGATIVE
Protein, ur: 100 mg/dL — AB
Specific Gravity, Urine: 1.017 (ref 1.005–1.030)
pH: 5 (ref 5.0–8.0)

## 2019-08-27 LAB — CBC
HCT: 40.8 % (ref 36.0–46.0)
Hemoglobin: 12 g/dL (ref 12.0–15.0)
MCH: 28.2 pg (ref 26.0–34.0)
MCHC: 29.4 g/dL — ABNORMAL LOW (ref 30.0–36.0)
MCV: 96 fL (ref 80.0–100.0)
Platelets: 328 10*3/uL (ref 150–400)
RBC: 4.25 MIL/uL (ref 3.87–5.11)
RDW: 16.8 % — ABNORMAL HIGH (ref 11.5–15.5)
WBC: 9.4 10*3/uL (ref 4.0–10.5)
nRBC: 0 % (ref 0.0–0.2)

## 2019-08-27 LAB — RAPID URINE DRUG SCREEN, HOSP PERFORMED
Amphetamines: NOT DETECTED
Barbiturates: NOT DETECTED
Benzodiazepines: NOT DETECTED
Cocaine: NOT DETECTED
Opiates: NOT DETECTED
Tetrahydrocannabinol: NOT DETECTED

## 2019-08-27 LAB — COMPREHENSIVE METABOLIC PANEL
ALT: 22 U/L (ref 0–44)
AST: 43 U/L — ABNORMAL HIGH (ref 15–41)
Albumin: 3.3 g/dL — ABNORMAL LOW (ref 3.5–5.0)
Alkaline Phosphatase: 46 U/L (ref 38–126)
Anion gap: 14 (ref 5–15)
BUN: 12 mg/dL (ref 8–23)
CO2: 21 mmol/L — ABNORMAL LOW (ref 22–32)
Calcium: 9.1 mg/dL (ref 8.9–10.3)
Chloride: 107 mmol/L (ref 98–111)
Creatinine, Ser: 0.95 mg/dL (ref 0.44–1.00)
GFR calc Af Amer: >60 mL/min (ref >60)
GFR calc non Af Amer: 56 mL/min — ABNORMAL LOW (ref >60)
Glucose, Bld: 105 mg/dL — ABNORMAL HIGH (ref 70–99)
Potassium: 3.5 mmol/L (ref 3.5–5.1)
Sodium: 142 mmol/L (ref 135–145)
Total Bilirubin: 0.7 mg/dL (ref 0.3–1.2)
Total Protein: 6.5 g/dL (ref 6.5–8.1)

## 2019-08-27 LAB — ETHANOL: Alcohol, Ethyl (B): <10 mg/dL (ref <10)

## 2019-08-27 LAB — SARS CORONAVIRUS 2 (TAT 6-24 HRS): SARS Coronavirus 2: NEGATIVE

## 2019-08-27 LAB — MAGNESIUM: Magnesium: 1.5 mg/dL — ABNORMAL LOW (ref 1.7–2.4)

## 2019-08-27 MED ORDER — PANTOPRAZOLE SODIUM 40 MG PO TBEC
40.0000 mg | DELAYED_RELEASE_TABLET | Freq: Every day | ORAL | Status: DC
  Administered 2019-08-27 – 2019-09-03 (×8): 40 mg via ORAL
  Filled 2019-08-27 (×8): qty 1

## 2019-08-27 MED ORDER — SODIUM CHLORIDE 0.9 % IV SOLN: 1.0000 g | INTRAVENOUS | Status: DC

## 2019-08-27 MED ORDER — DICYCLOMINE HCL 10 MG PO CAPS
10.0000 mg | ORAL_CAPSULE | Freq: Three times a day (TID) | ORAL | Status: DC | PRN
  Filled 2019-08-27: qty 1

## 2019-08-27 MED ORDER — ACETAMINOPHEN 650 MG RE SUPP: 650.0000 mg | Freq: Four times a day (QID) | RECTAL | Status: DC | PRN

## 2019-08-27 MED ORDER — ONDANSETRON HCL 4 MG/2ML IJ SOLN
4.0000 mg | Freq: Once | INTRAMUSCULAR | Status: AC
  Administered 2019-08-27: 4 mg via INTRAVENOUS
  Filled 2019-08-27: qty 2

## 2019-08-27 MED ORDER — HYDROCODONE-ACETAMINOPHEN 5-325 MG PO TABS
1.0000 | ORAL_TABLET | Freq: Once | ORAL | Status: AC
  Administered 2019-08-27: 06:00:00 1 via ORAL
  Filled 2019-08-27: qty 1

## 2019-08-27 MED ORDER — MAGNESIUM SULFATE 2 GM/50ML IV SOLN
2.0000 g | Freq: Once | INTRAVENOUS | Status: AC
  Administered 2019-08-27: 2 g via INTRAVENOUS
  Filled 2019-08-27: qty 50

## 2019-08-27 MED ORDER — ACETAMINOPHEN 325 MG PO TABS
650.0000 mg | ORAL_TABLET | Freq: Four times a day (QID) | ORAL | Status: DC | PRN
  Administered 2019-08-27 – 2019-09-03 (×5): 650 mg via ORAL
  Filled 2019-08-27 (×6): qty 2

## 2019-08-27 MED ORDER — ENOXAPARIN SODIUM 40 MG/0.4ML ~~LOC~~ SOLN
40.0000 mg | Freq: Every day | SUBCUTANEOUS | Status: DC
  Administered 2019-08-27 – 2019-09-03 (×8): 40 mg via SUBCUTANEOUS
  Filled 2019-08-27 (×8): qty 0.4

## 2019-08-27 MED ORDER — FENTANYL CITRATE (PF) 100 MCG/2ML IJ SOLN
50.0000 ug | Freq: Once | INTRAMUSCULAR | Status: AC
  Administered 2019-08-27: 04:00:00 50 ug via INTRAVENOUS
  Filled 2019-08-27: qty 2

## 2019-08-27 MED ORDER — METHYLPREDNISOLONE 4 MG PO TABS
4.0000 mg | ORAL_TABLET | Freq: Every day | ORAL | Status: DC
  Administered 2019-08-27 – 2019-09-03 (×8): 4 mg via ORAL
  Filled 2019-08-27 (×8): qty 1

## 2019-08-27 MED ORDER — POLYETHYLENE GLYCOL 3350 17 G PO PACK
17.0000 g | PACK | Freq: Every day | ORAL | Status: DC
  Administered 2019-08-27 – 2019-09-03 (×6): 17 g via ORAL
  Filled 2019-08-27 (×8): qty 1

## 2019-08-27 MED ORDER — BOOST / RESOURCE BREEZE PO LIQD CUSTOM
1.0000 | Freq: Three times a day (TID) | ORAL | Status: DC
  Administered 2019-08-27 – 2019-09-03 (×8): 1 via ORAL

## 2019-08-27 MED ORDER — ATORVASTATIN CALCIUM 10 MG PO TABS
10.0000 mg | ORAL_TABLET | Freq: Every day | ORAL | Status: DC
  Administered 2019-08-27 – 2019-09-02 (×7): 10 mg via ORAL
  Filled 2019-08-27 (×7): qty 1

## 2019-08-27 MED ORDER — SODIUM CHLORIDE 0.9 % IV SOLN: INTRAVENOUS | Status: DC

## 2019-08-27 MED ORDER — DOCUSATE SODIUM 100 MG PO CAPS
100.0000 mg | ORAL_CAPSULE | Freq: Two times a day (BID) | ORAL | Status: DC
  Administered 2019-08-27 – 2019-09-03 (×12): 100 mg via ORAL
  Filled 2019-08-27 (×14): qty 1

## 2019-08-27 MED ORDER — ASPIRIN EC 81 MG PO TBEC
81.0000 mg | DELAYED_RELEASE_TABLET | ORAL | Status: DC
  Administered 2019-08-27 – 2019-09-03 (×4): 81 mg via ORAL
  Filled 2019-08-27 (×8): qty 1

## 2019-08-27 MED ORDER — MOMETASONE FURO-FORMOTEROL FUM 200-5 MCG/ACT IN AERO
2.0000 | INHALATION_SPRAY | Freq: Two times a day (BID) | RESPIRATORY_TRACT | Status: DC
  Administered 2019-08-27 – 2019-09-03 (×14): 2 via RESPIRATORY_TRACT
  Filled 2019-08-27: qty 8.8

## 2019-08-27 MED ORDER — MAGNESIUM CHLORIDE 64 MG PO TBEC
1.0000 | DELAYED_RELEASE_TABLET | Freq: Two times a day (BID) | ORAL | Status: DC
  Administered 2019-08-27 – 2019-09-03 (×15): 64 mg via ORAL
  Filled 2019-08-27 (×16): qty 1

## 2019-08-27 MED ORDER — METOCLOPRAMIDE HCL 5 MG PO TABS
5.0000 mg | ORAL_TABLET | Freq: Three times a day (TID) | ORAL | Status: DC
  Administered 2019-08-27 – 2019-09-01 (×15): 5 mg via ORAL
  Filled 2019-08-27 (×16): qty 1

## 2019-08-27 MED ORDER — ONDANSETRON HCL 4 MG PO TABS
4.0000 mg | ORAL_TABLET | Freq: Four times a day (QID) | ORAL | Status: DC | PRN
  Administered 2019-08-27: 4 mg via ORAL
  Filled 2019-08-27: qty 1

## 2019-08-27 MED ORDER — POTASSIUM CHLORIDE CRYS ER 20 MEQ PO TBCR
40.0000 meq | EXTENDED_RELEASE_TABLET | Freq: Once | ORAL | Status: AC
  Administered 2019-08-27: 40 meq via ORAL
  Filled 2019-08-27: qty 2

## 2019-08-27 MED ORDER — SODIUM CHLORIDE 0.9 % IV BOLUS (SEPSIS)
1000.0000 mL | Freq: Once | INTRAVENOUS | Status: AC
  Administered 2019-08-27: 1000 mL via INTRAVENOUS

## 2019-08-27 MED ORDER — BISACODYL 10 MG RE SUPP
10.0000 mg | Freq: Every day | RECTAL | Status: DC | PRN
  Administered 2019-08-27 – 2019-09-03 (×3): 10 mg via RECTAL
  Filled 2019-08-27 (×4): qty 1

## 2019-08-27 MED ORDER — ALBUTEROL SULFATE (2.5 MG/3ML) 0.083% IN NEBU: 2.5000 mg | INHALATION_SOLUTION | Freq: Four times a day (QID) | RESPIRATORY_TRACT | Status: DC | PRN

## 2019-08-27 MED ORDER — ONDANSETRON HCL 4 MG/2ML IJ SOLN
4.0000 mg | Freq: Four times a day (QID) | INTRAMUSCULAR | Status: DC | PRN
  Administered 2019-08-30: 4 mg via INTRAVENOUS
  Filled 2019-08-27: qty 2

## 2019-08-27 NOTE — Evaluation (Signed)
Occupational Therapy Evaluation Patient Details Name: [name] MRN: [id number] DOB: [birth year] Today's Date: 08/27/2019    History of Present Illness 84 yo female admitted to ED with syncope with x3 falls at home, dehydration, generalized weakness. CT head and neck negative for acute abnormalities. PMH includes CHF, COPD, UTIs, anxiety, falls, DMII, L5-S1 lumbar discectomy, RA, migraines.   Clinical Impression   Pt admitted with above. She demonstrates the below listed deficits and will benefit from continued OT to maximize safety and independence with BADLs.  Pt presents to OT with generalized weakness, impaired balance, decreased activity tolerance, and impaired cognition. She provided conflicting info to each OT, and PT re: her PLOF.  She currently requires min - total A for ADLs, and only was able to move to EOB sitting. She adamantly refused OOB activity due to fatigue.  She reports she lives alone with niece and nephew checking in to assist around their work schedules. She reports they assist her with bathing, dressing, medication management and meals.  Recommend SNF level rehab.       Follow Up Recommendations  SNF;Supervision/Assistance - 24 hour    Equipment Recommendations  None recommended by OT    Recommendations for Other Services       Precautions / Restrictions Precautions Precautions: Fall Precaution Comments: + orthostatic in ED Restrictions Weight Bearing Restrictions: No      Mobility Bed Mobility Overal bed mobility: Needs Assistance Bed Mobility: Supine to Sit;Sit to Supine     Supine to sit: Min assist Sit to supine: Min assist   General bed mobility comments: assist to initiate movement   Transfers Overall transfer level: Needs assistance Equipment used: 1 person hand held assist Transfers: Sit to/from Stand Sit to Stand: Mod assist;From elevated surface         General transfer comment: Pt adamantly refused to attempt standing citing  fatigue     Balance Overall balance assessment: Needs assistance;History of Falls Sitting-balance support: Single extremity supported;Feet supported Sitting balance-Leahy Scale: Fair   Postural control: Posterior lean Standing balance support: Bilateral upper extremity supported Standing balance-Leahy Scale: Poor Standing balance comment: reliant on external support                           ADL either performed or assessed with clinical judgement   ADL Overall ADL's : Needs assistance/impaired Eating/Feeding: Independent   Grooming: Wash/dry hands;Wash/dry face;Oral care;Brushing hair;Minimal assistance;Sitting   Upper Body Bathing: Moderate assistance;Sitting   Lower Body Bathing: Maximal assistance;Bed level   Upper Body Dressing : Moderate assistance;Sitting   Lower Body Dressing: Total assistance;Sit to/from stand   Toilet Transfer: Total assistance Toilet Transfer Details (indicate cue type and reason): Pt unable  Toileting- Clothing Manipulation and Hygiene: Total assistance;Bed level;Sitting/lateral lean       Functional mobility during ADLs: (Pt unable to attempt )       Vision         Perception     Praxis      Pertinent Vitals/Pain Pain Assessment: Faces Faces Pain Scale: Hurts little more Pain Location: R shoulder Pain Descriptors / Indicators: Discomfort;Guarding Pain Intervention(s): Monitored during session     Hand Dominance Right   Extremity/Trunk Assessment Upper Extremity Assessment Upper Extremity Assessment: Generalized weakness;RUE deficits/detail;LUE deficits/detail RUE Deficits / Details: Pt with limited shoulder elevation which she reports is long standing due to arthritis.  She demonstrates arthritic defomity bil. hand s RUE Coordination: decreased fine motor;decreased gross motor LUE  Coordination: decreased fine motor;decreased gross motor   Lower Extremity Assessment Lower Extremity Assessment: Defer to PT  evaluation   Cervical / Trunk Assessment Cervical / Trunk Assessment: Kyphotic   Communication Communication Communication: No difficulties   Cognition Arousal/Alertness: Awake/alert Behavior During Therapy: Anxious Overall Cognitive Status: Impaired/Different from baseline Area of Impairment: Attention;Memory;Following commands;Safety/judgement;Problem solving                   Current Attention Level: Sustained Memory: Decreased short-term memory Following Commands: Follows one step commands consistently Safety/Judgement: Decreased awareness of safety;Decreased awareness of deficits   Problem Solving: Slow processing;Decreased initiation;Requires verbal cues General Comments: Pt is very slow to respond and initiate activities.  She seems to have decreased awareness about her deficits.  She provided conflicting info re: PLOF to OT and PT    General Comments  BP supine 142/96; sitting 151/88    Exercises     Shoulder Instructions      Home Living Family/patient expects to be discharged to:: Private residence Living Arrangements: Other relatives Available Help at Discharge: Family;Neighbor;Available PRN/intermittently Type of Home: House Home Access: Stairs to enter [employer] of Steps: 3 Entrance Stairs-Rails: Right Home Layout: One level     Bathroom Shower/Tub: Teacher, early years/pre: Standard     Home Equipment: Tub bench;Bedside commode;Walker - 2 wheels   Additional Comments: neice and nephew assist wtih ADLs and  IADL's      Prior Functioning/Environment Level of Independence: Independent with assistive device(s);Needs assistance  Gait / Transfers Assistance Needed: Pt reports she uses RW, but she reports to OT that she has not been walking for the past several weeks and only moving bed <> BSC  ADL's / Homemaking Assistance Needed: Pt reports niece and nephew stop in before and after they go to work and assist wtih ADLs and  IADLs.  She reports niece helps her with bathing and dressing, as well as medication management.  Family assists with all ADLs    Comments: Pt's info provided to PT and OT re: PLOF are not the same         OT Problem List: Decreased strength;Decreased activity tolerance;Impaired balance (sitting and/or standing);Decreased cognition;Decreased knowledge of use of DME or AE;Decreased safety awareness;Impaired UE functional use;Pain      OT Treatment/Interventions: Self-care/ADL training;Therapeutic exercise;Energy conservation;DME and/or AE instruction;Therapeutic activities;Cognitive remediation/compensation;Patient/family education;Balance training    OT Goals(Current goals can be found in the care plan section) Acute Rehab OT Goals Patient Stated Goal: to get stronger  OT Goal Formulation: With patient Time For Goal Achievement: 09/10/19 Potential to Achieve Goals: Good ADL Goals Pt Will Perform Grooming: with min guard assist;standing Pt Will Perform Upper Body Bathing: sitting;with set-up Pt Will Perform Lower Body Bathing: with min assist;sit to/from stand Pt Will Perform Upper Body Dressing: with set-up;with supervision;sitting Pt Will Perform Lower Body Dressing: with min assist;sit to/from stand Pt Will Transfer to Toilet: (P) with min assist;stand pivot transfer;bedside commode  OT Frequency: Min 3X/week   Barriers to D/C: Decreased caregiver support          Co-evaluation              AM-PAC OT "6 Clicks" Daily Activity     Outcome Measure Help from another person eating meals?: A Little Help from another person taking care of personal grooming?: A Little Help from another person toileting, which includes using toliet, bedpan, or urinal?: A Lot Help from another person bathing (including washing, rinsing, drying)?: A  Lot Help from another person to put on and taking off regular upper body clothing?: A Lot Help from another person to put on and taking off regular  lower body clothing?: A Lot 6 Click Score: 14   End of Session Nurse Communication: Mobility status  Activity Tolerance: Patient limited by fatigue Patient left: in bed;with call bell/phone within reach  OT Visit Diagnosis: Muscle weakness (generalized) (M62.81);Cognitive communication deficit (R41.841)                Time: [id number] OT Time Calculation (min): 22 min Charges:  OT General Charges $OT Visit: 1 Visit OT Evaluation $OT Eval Moderate Complexity: 1 Mod  Nilsa Nutting., OTR/L Acute Rehabilitation Services Pager [phone] Office [phone]   Lucille Passy M 08/27/2019, 1:37 PM

## 2019-08-27 NOTE — ED Triage Notes (Signed)
Pt bib [facility] from home after syncopal episode. Per EMS pt has generalized weakness x 1 week. Pt denies hitting her head and denies taking blood thinners. Pt AOx4, neuro intact. EMS VS  HR 100 NSR SPO2 98% on RA BP 120/90 RR 16

## 2019-08-27 NOTE — Evaluation (Addendum)
Physical Therapy Evaluation Patient Details Name: [name] MRN: [id number] DOB: 01/03/1936 Today's Date: 08/27/2019   History of Present Illness  84 yo female admitted to ED with syncope with x3 falls at home, dehydration, generalized weakness. CT head and neck negative for acute abnormalities. PMH includes CHF, COPD, UTIs, anxiety, falls, DMII, L5-S1 lumbar discectomy, RA, migraines.  Clinical Impression   Pt presents with generalized weakness, difficulty performing mobility tasks, unsteadiness in standing, inability to ambulate, and decreased activity tolerance. Pt to benefit from acute PT to address deficits. Pt required mod assist for bed mobility and transfer to standing today, pt reporting dizziness but vitals not orthostatic. PT recommends SNF level of care, as pt does not have 24/7 assist and is requiring significant physical assist at this time. Per pt, she would rather d/c home with family (see below). PT to progress mobility as tolerated, and will continue to follow acutely.   Per OT who saw pt after PT, pt states to OT that she has not been ambulatory for weeks and is transfer level at this time. PT recommendation remains the same.      Follow Up Recommendations SNF;Supervision/Assistance - 24 hour(per pt, she refuses SNF at this time and is requesting HHPT. Pt's nephew in room, states "she just needs to get her strength back".)    Equipment Recommendations  None recommended by PT    Recommendations for Other Services       Precautions / Restrictions Precautions Precautions: Fall Precaution Comments: + orthostatic in ED Restrictions Weight Bearing Restrictions: No      Mobility  Bed Mobility Overal bed mobility: Needs Assistance Bed Mobility: Supine to Sit;Sit to Supine     Supine to sit: Min assist;HOB elevated Sit to supine: Mod assist;HOB elevated   General bed mobility comments: min-mod assist for trunk and LE management, scooting to and from EOB. Pt  reporting dizziness sitting, BP 160s/120s sitting EOB (unsure of accuracy), taken again and still not orthostatic.  Transfers Overall transfer level: Needs assistance Equipment used: 1 person hand held assist Transfers: Sit to/from Stand Sit to Stand: Mod assist;From elevated surface         General transfer comment: Mod assist for power up, steadying, correcting posterior leaning. Pt requires constant PT support and correction, able to take 3 steps laterally toward [facility] but unable to progress to forward ambulation due to pt fatigue and weakness.  Ambulation/Gait             General Gait Details: unable  Stairs            Wheelchair Mobility    Modified Rankin (Stroke Patients Only)       Balance Overall balance assessment: Needs assistance;History of Falls Sitting-balance support: Bilateral upper extremity supported;Feet supported Sitting balance-Leahy Scale: Fair   Postural control: Posterior lean Standing balance support: Bilateral upper extremity supported Standing balance-Leahy Scale: Poor Standing balance comment: reliant on external support                             Pertinent Vitals/Pain Pain Assessment: Faces Faces Pain Scale: Hurts little more Pain Location: R shoulder Pain Descriptors / Indicators: Discomfort;Guarding Pain Intervention(s): Limited activity within patient's tolerance;Monitored during session;Repositioned    Home Living Family/patient expects to be discharged to:: Private residence Living Arrangements: Other relatives(nephew, niece checks on pt multiple times a day) Available Help at Discharge: Family;Neighbor;Available PRN/intermittently Type of Home: House(duplex) Home Access: Stairs to enter Entrance Stairs-Rails:  Right Entrance Stairs-Number of Steps: 3 Home Layout: One level Home Equipment: Tub bench;Bedside commode;Walker - 2 wheels      Prior Function Level of Independence: Independent with assistive  device(s)         Comments: pt reports using RW as needed. Pt able to cook and clean, but her niece and nephew help with groceries, medication management, bathing. Pt does not drive.     Hand Dominance   Dominant Hand: Right    Extremity/Trunk Assessment   Upper Extremity Assessment Upper Extremity Assessment: Generalized weakness    Lower Extremity Assessment Lower Extremity Assessment: Generalized weakness    Cervical / Trunk Assessment Cervical / Trunk Assessment: Kyphotic  Communication   Communication: No difficulties  Cognition Arousal/Alertness: Awake/alert Behavior During Therapy: WFL for tasks assessed/performed;Anxious Overall Cognitive Status: Impaired/Different from baseline Area of Impairment: Following commands;Problem solving;Safety/judgement                       Following Commands: Follows one step commands consistently;Follows one step commands with increased time Safety/Judgement: Decreased awareness of deficits;Decreased awareness of safety   Problem Solving: Slow processing General Comments: Pt with whole-body trembling with mobility, states she is nervous. Increased time to respond to questions, mobilize when cued.      General Comments      Exercises     Assessment/Plan    PT Assessment Patient needs continued PT services  PT Problem List Decreased strength;Decreased mobility;Decreased activity tolerance;Decreased balance;Decreased range of motion;Decreased knowledge of use of DME;Pain;Cardiopulmonary status limiting activity;Decreased cognition       PT Treatment Interventions DME instruction;Therapeutic activities;Gait training;Therapeutic exercise;Patient/family education;Balance training;Stair training;Functional mobility training;Neuromuscular re-education    PT Goals (Current goals can be found in the Care Plan section)  Acute Rehab PT Goals Patient Stated Goal: go home PT Goal Formulation: With patient Time For Goal  Achievement: 09/10/19 Potential to Achieve Goals: Fair    Frequency Min 3X/week   Barriers to discharge        Co-evaluation               AM-PAC PT "6 Clicks" Mobility  Outcome Measure Help needed turning from your back to your side while in a flat bed without using bedrails?: A Lot Help needed moving from lying on your back to sitting on the side of a flat bed without using bedrails?: A Lot Help needed moving to and from a bed to a chair (including a wheelchair)?: A Lot Help needed standing up from a chair using your arms (e.g., wheelchair or bedside chair)?: A Lot Help needed to walk in hospital room?: Total Help needed climbing 3-5 steps with a railing? : Total 6 Click Score: 10    End of Session Equipment Utilized During Treatment: Gait belt Activity Tolerance: Patient limited by fatigue Patient left: in bed;with call bell/phone within reach;with bed alarm set;with family/visitor present Nurse Communication: Mobility status PT Visit Diagnosis: Muscle weakness (generalized) (M62.81);Difficulty in walking, not elsewhere classified (R26.2);History of falling (Z91.81)    Time: [id number] PT Time Calculation (min) (ACUTE ONLY): 28 min   Charges:   PT Evaluation $PT Eval Low Complexity: 1 Low PT Treatments $Therapeutic Activity: 8-22 mins       Jewel Venditto E, PT Acute Rehabilitation Services Pager [phone]  Office [phone]   Garlen Reinig D Elonda Husky 08/27/2019, 12:57 PM

## 2019-08-27 NOTE — H&P (Addendum)
History and Physical    [name] [id number] DOB: [birth year] DOA: 08/27/2019  PCP: [name], PA-C    Patient coming from: Home    Chief Complaint: Passed out  HPI: [name] is a 84 y.o. female with medical history significant of hypertension, CHF, diabetes mellitus type 2, COPD, frequent UTIs, came with a chief complaint of syncopal episode.  She passed out in her way to the bathroom.  She felt lightheaded and passed out.  She is not sure if she injured her head.  She reports some neck pain and headache.  She reports some mild dysuria. She has history of tachycardia. She has history of rheumatoid arthritis is on chronic prednisone She was seen recently by GI Dr. Havery Moros for gastroparesis and constipation.  She had an upper endoscopy with no gross lesions, erythematous mucosa of the stomach biopsy for H pylori ED Course:  In the emergency room she was found to be tachycardic mildly dehydrated Electrocardiogram no acute ST-T changes CT head and neck negative Covid test pending Urine analysis questionable UTI Potassium borderline low at 3.5, history of hypokalemia Recent echocardiogram in 2020 ejection fraction 65%  Review of Systems: As per HPI otherwise 10 point review of systems negative.  With the exception of loss of consciousness/passout, headache and neck pain  Past Medical History:  Diagnosis Date  . Allergy   . Anemia    "when I was young"  . Anginal pain ([place])   . Anxiety   . Arthritis    "all over my body"   . Chest pain 04/07/2014  . Chronic diastolic heart failure, NYHA class 1 ([place]) 01/22/2015  . Chronic lower back pain   . Chronic steroid use 12/11/2012  . COPD (chronic obstructive pulmonary disease) ([place]) 01/09/2017  . 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 Elevated lactic acid level 01/22/2015  . Essential hypertension 06/15/2018  . Frequent UTI    "recently" (06/14/2015)  . Gastric mass    . GERD (gastroesophageal reflux disease)   . Headache(784.0)    "weekly; wake up w/them " (110/19/2018)  . Heart murmur   . High cholesterol   . History of blood transfusion ~ 1954   "related to my periods"  . Migraine    "once/month maybe" (05/16/2017)  . Multiple falls 06/14/2015  . Noncompliance 12/11/2012  . OSA (obstructive sleep apnea)    "I think I'm using a BiPAP" (06/14/2015)  . Sepsis ([place]) 01/09/2017  . Syncope and collapse 12/11/2012  . Tachycardia with heart rate 100-120 beats per minute 04/08/2014  . Type II diabetes mellitus ([place])   . Uncontrolled type 2 diabetes mellitus with complication [facility])     Past Surgical History:  Procedure Laterality Date  . APPENDECTOMY  1970   Archie Endo 12/12/2010  . BACK SURGERY    . BIOPSY  05/25/2019   Procedure: BIOPSY;  Surgeon: Rush Landmark Telford Nab., MD;  Location: [place];  Service: Gastroenterology;;  . [place]   Archie Endo 12/12/2010  . ESOPHAGOGASTRODUODENOSCOPY N/A 05/25/2019   Procedure: ESOPHAGOGASTRODUODENOSCOPY (EGD);  Surgeon: Irving Copas., MD;  Location: [place];  Service: Gastroenterology;  Laterality: N/A;  . EUS N/A 10/16/2017   Procedure: UPPER ENDOSCOPIC ULTRASOUND (EUS) RADIAL;  Surgeon: Milus Banister, MD;  Location: [facility];  Service: Endoscopy;  Laterality: N/A;  . FRACTURE SURGERY    . LAPAROSCOPIC CHOLECYSTECTOMY  1990's  . LUMBAR MICRODISCECTOMY Left 09/2006   L5-S1/notes 12/12/2010  . MULTIPLE EXTRACTIONS WITH ALVEOLOPLASTY Bilateral  06/13/2017   Procedure: MULTIPLE EXTRACTION WITH ALVEOLOPLASTY;  Surgeon: Diona Browner, DDS;  Location: [place];  Service: Oral Surgery;  Laterality: Bilateral;  . MULTIPLE TOOTH EXTRACTIONS    . ORIF SHOULDER FRACTURE Left ~ 2009   "put cement down in it"   . TONSILLECTOMY  1950's?  . TOTAL ABDOMINAL HYSTERECTOMY  1979   Archie Endo 12/12/2010     reports that she has quit smoking. Her smoking use included cigarettes. She has never  used smokeless tobacco. She reports previous alcohol use. She reports that she does not use drugs.  No Known Allergies  Family History  Problem Relation Age of Onset  . Hypertension Mother   . Heart attack Father   . Diabetes Mellitus II Sister   . Hypertension Sister   . Cancer - Other Sister   . Migraines Niece   . Colon cancer Neg Hx      Prior to Admission medications   Medication Sig Start Date End Date Taking? Authorizing Provider  albuterol (ACCUNEB) 0.63 MG/3ML nebulizer solution Take 2 ampules by nebulization every 6 (six) hours as needed for wheezing.   Yes [provider]  aspirin EC 81 MG tablet Take 1 tablet (81 mg total) by mouth every Monday, Wednesday, and Friday. 08/04/19  Yes Eulogio Bear U, DO  atorvastatin (LIPITOR) 10 MG tablet Take 1 Tablet by mouth daily Patient taking differently: Take 10 mg by mouth daily at 6 PM.  07/07/19  Yes Rodriguez-Southworth, Sunday Spillers, PA-C  dicyclomine (BENTYL) 10 MG capsule Take 1 capsule (10 mg total) by mouth every 8 (eight) hours as needed for spasms. 08/23/19  Yes Armbruster, Carlota Raspberry, MD  docusate sodium (COLACE) 100 MG capsule Take 1 capsule (100 mg total) by mouth 2 (two) times daily. 08/03/19  Yes Eulogio Bear U, DO  glucose blood (ONETOUCH VERIO) test strip Use to check blood sugar 3 to 4 times a day before meals 03/25/19  Yes Rodriguez-Southworth, Sunday Spillers, PA-C  hydrOXYzine (ATARAX/VISTARIL) 10 MG tablet 1 q 8h prn itching Patient taking differently: Take 10 mg by mouth every 8 (eight) hours as needed for itching.  04/22/19  Yes Rodriguez-Southworth, Sunday Spillers, PA-C  lisinopril-hydrochlorothiazide (ZESTORETIC) 10-12.5 MG tablet Take 1 tablet by mouth daily.   Yes [provider]  magnesium chloride (SLOW-MAG) 64 MG TBEC SR tablet Take 1 tablet (64 mg total) by mouth 2 (two) times daily. 08/13/19  Yes Quintella Reichert, MD  methylPREDNISolone (MEDROL) 4 MG tablet Take 4 mg by mouth daily.   Yes Rosita Kea, PA-C   metoCLOPramide (REGLAN) 5 MG tablet Take 1 tablet (5 mg total) by mouth 3 (three) times daily before meals. 08/23/19  Yes Armbruster, Carlota Raspberry, MD  mometasone-formoterol (DULERA) 200-5 MCG/ACT AERO Inhale 2 puffs into the lungs 2 (two) times daily.   Yes [provider]  pantoprazole (PROTONIX) 40 MG tablet Take 1 tablet (40 mg total) by mouth daily. 08/03/19 10/02/19 Yes Vann, Jessica U, DO  polyethylene glycol (MIRALAX) 17 g packet Take 17 g by mouth daily. 08/23/19  Yes Armbruster, Carlota Raspberry, MD  simethicone (GAS-X EXTRA STRENGTH) 125 MG chewable tablet Chew 125 mg by mouth every 6 (six) hours as needed for flatulence.   Yes [provider]  ondansetron (ZOFRAN ODT) 4 MG disintegrating tablet Take 1 tablet (4 mg total) by mouth every 8 (eight) hours as needed for nausea or vomiting. Patient not taking: Reported on 08/23/2019 06/10/19   Rodriguez-Southworth, Sunday Spillers, PA-C  Potassium Chloride 40 MEQ/15ML (20%) SOLN Take  20 mEq by mouth daily for 10 days. 08/13/19 08/23/19  Fatima Blank, MD  traMADol (ULTRAM) 50 MG tablet Take 1 tablet (50 mg total) by mouth every 6 (six) hours as needed. Patient not taking: Reported on 08/23/2019 07/15/19   [name], [place]    Physical Exam: Vitals:   08/27/19 0626 08/27/19 0627 08/27/19 0656 08/27/19 0700  BP:   (!) 149/67   Pulse: (!) 102 (!) 101 (!) 101   Resp:   17   Temp:   99.1 F (37.3 C)   TempSrc:   Oral   SpO2: 100% 100% 100%   Weight:    51.3 kg    Constitutional: NAD, calm, comfortable Vitals:   08/27/19 0626 08/27/19 0627 08/27/19 0656 08/27/19 0700  BP:   (!) 149/67   Pulse: (!) 102 (!) 101 (!) 101   Resp:   17   Temp:   99.1 F (37.3 C)   TempSrc:   Oral   SpO2: 100% 100% 100%   Weight:    51.3 kg   Eyes: PERRL, lids and conjunctivae normal ENMT: Mucous membranes are moist. Posterior pharynx clear of any exudate or lesions.Normal dentition.  Neck: normal, supple, no masses, no thyromegaly  Respiratory: clear to auscultation bilaterally, no wheezing, no crackles. Normal respiratory effort. No accessory muscle use.  Cardiovascular: Regular rate and rhythm, no murmurs / rubs / gallops. No extremity edema. 2+ pedal pulses. No carotid bruits.  Abdomen: no tenderness, no masses palpated. No hepatosplenomegaly. Bowel sounds positive.  Musculoskeletal: no clubbing / cyanosis. No joint deformity upper and lower extremities. Good ROM, no contractures. Normal muscle tone.  Skin: no rashes, lesions, ulcers. No induration Neurologic: CN 2-12 grossly intact. Sensation intact, DTR normal. Strength 5/5 in all 4.  Psychiatric: Normal judgment and insight. Alert and oriented x 3. Normal mood.    Labs on Admission: I have personally reviewed following labs and imaging studies  CBC: Recent Labs  Lab 08/27/19 0240  WBC 9.4  NEUTROABS 6.8  HGB 12.0  HCT 40.8  MCV 96.0  PLT [id number]   Basic Metabolic Panel: Recent Labs  Lab 08/27/19 0240  NA 142  K 3.5  CL 107  CO2 21*  GLUCOSE 105*  BUN 12  CREATININE 0.95  CALCIUM 9.1   GFR: Estimated Creatinine Clearance: 37.6 mL/min (by C-G formula based on SCr of 0.95 mg/dL). Liver Function Tests: Recent Labs  Lab 08/27/19 0240  AST 43*  ALT 22  ALKPHOS 46  BILITOT 0.7  PROT 6.5  ALBUMIN 3.3*   No results for input(s): LIPASE, AMYLASE in the last 168 hours. No results for input(s): AMMONIA in the last 168 hours. Coagulation Profile: No results for input(s): INR, PROTIME in the last 168 hours. Cardiac Enzymes: No results for input(s): CKTOTAL, CKMB, CKMBINDEX, TROPONINI in the last 168 hours. BNP (last 3 results) No results for input(s): PROBNP in the last 8760 hours. HbA1C: No results for input(s): HGBA1C in the last 72 hours. CBG: No results for input(s): GLUCAP in the last 168 hours. Lipid Profile: No results for input(s): CHOL, HDL, LDLCALC, TRIG, CHOLHDL, LDLDIRECT in the last 72 hours. Thyroid Function Tests: No results  for input(s): TSH, T4TOTAL, FREET4, T3FREE, THYROIDAB in the last 72 hours. Anemia Panel: No results for input(s): VITAMINB12, FOLATE, FERRITIN, TIBC, IRON, RETICCTPCT in the last 72 hours. Urine analysis:    Component Value Date/Time   COLORURINE YELLOW 08/27/2019 0213   APPEARANCEUR CLEAR 08/27/2019 0213   LABSPEC 1.017 08/27/2019  [place] 5.0 08/27/2019 0213   GLUCOSEU NEGATIVE 08/27/2019 0213   HGBUR NEGATIVE 08/27/2019 0213   [place] 08/27/2019 0213   BILIRUBINUR negative 09/10/2018 1523   KETONESUR 5 (A) 08/27/2019 0213   PROTEINUR 100 (A) 08/27/2019 0213   UROBILINOGEN 2.0 (H) 08/13/2019 1542   NITRITE NEGATIVE 08/27/2019 0213   LEUKOCYTESUR SMALL (A) 08/27/2019 0213    Radiological Exams on Admission: CT HEAD WO CONTRAST  Result Date: 08/27/2019 CLINICAL DATA:  Syncope EXAM: CT HEAD WITHOUT CONTRAST TECHNIQUE: Contiguous axial images were obtained from the base of the skull through the vertex without intravenous contrast. COMPARISON:  05/19/2019 FINDINGS: Brain: There is atrophy and chronic small vessel disease changes. No acute intracranial abnormality. Specifically, no hemorrhage, hydrocephalus, mass lesion, acute infarction, or significant intracranial injury. Vascular: No hyperdense vessel or unexpected calcification. Skull: No acute calvarial abnormality. Sinuses/Orbits: Visualized paranasal sinuses and mastoids clear. Orbital soft tissues unremarkable. Other: None IMPRESSION: Atrophy, chronic microvascular disease. No acute intracranial abnormality. Electronically Signed   By: Rolm Baptise M.D.   On: 08/27/2019 02:10   CT CERVICAL SPINE WO CONTRAST  Result Date: 08/27/2019 CLINICAL DATA:  Syncope EXAM: CT CERVICAL SPINE WITHOUT CONTRAST TECHNIQUE: Multidetector CT imaging of the cervical spine was performed without intravenous contrast. Multiplanar CT image reconstructions were also generated. COMPARISON:  MRI 06/15/2015 FINDINGS: Alignment: No subluxation  Skull base and vertebrae: No acute fracture. No primary bone lesion or focal pathologic process. Soft tissues and spinal canal: No prevertebral fluid or swelling. No visible canal hematoma. Disc levels:  Diffuse degenerative disc and facet disease. Upper chest: No acute findings Other: Carotid artery calcifications. IMPRESSION: Cervical spondylosis.  No acute bony abnormality. Electronically Signed   By: Rolm Baptise M.D.   On: 08/27/2019 02:12   DG Chest Port 1 View  Result Date: 08/27/2019 CLINICAL DATA:  Syncope, weakness for 1 week, denies head strike EXAM: PORTABLE CHEST 1 VIEW COMPARISON:  Radiograph 07/12/2019 FINDINGS: Bandlike area of scarring or atelectasis in the right mid lung with chronic elevation of the right hemidiaphragm. No consolidation, features of edema, pneumothorax, or effusion. The cardiomediastinal contours are unremarkable. No acute osseous or soft tissue abnormality. Degenerative changes are present in the imaged spine and shoulders. Elevation of the right humerus relative to the glenoid with undersurface remodeling of the acromion likely reflect some rotator cuff insufficiency. Telemetry leads overlie the chest. IMPRESSION: No acute cardiopulmonary abnormality. Electronically Signed   By: Lovena Le M.D.   On: 08/27/2019 02:23   DG Humerus Right  Result Date: 08/27/2019 CLINICAL DATA:  Pain, generalized weakness EXAM: RIGHT HUMERUS - 2+ VIEW COMPARISON:  Concurrent chest radiograph FINDINGS: The osseous structures appear diffusely demineralized which may limit detection of small or nondisplaced fractures. No acute bony abnormality. Specifically, no fracture, subluxation, or dislocation. Slight elevation of the right humeral head relative to the glenoid with some mild undersurface remodeling of the acromion is compatible with chronic rotator cuff insufficiency. Degenerative changes at the glenohumeral joint are mild with more moderate change at the acromioclavicular joint.  Acromioclavicular and coracoclavicular intervals are maintained. The humerus is intact. Alignment at the elbow is grossly preserved though incompletely assessed on this non dedicated radiograph. Benign-appearing soft tissue mineralization seen in the lateral upper arm. IMPRESSION: No acute bony abnormality. Degenerative changes at the glenohumeral and acromioclavicular joints. Findings compatible with chronic rotator cuff insufficiency. Electronically Signed   By: Lovena Le M.D.   On: 08/27/2019 02:26    EKG: Independently reviewed.  Sinus tachycardia no acute ST-T changes left anterior hemiblock  Assessment and plan  Syncopal episode Probably vasovagal and dehydration She reports she was walking to the bathroom and she felt lightheaded and passed out Plan IV fluids, echocardiogram [id number] normal systolic function ejection fraction 60 to 65%,  Headache and neck pain CT scan of the head and cervical spine negative Plan pain medication  History of rheumatoid arthritis On prednisone 40 mg daily  History of gastroparesis On Reglan 5 mg 3 times a day  History of COPD Resume inhaler  Hypercholesterolemia Resume Lipitor  Diabetes mellitus type 2 under control No medication listed  Essential hypertension Stable no medication listed  UTI/questionable Urine culture, Rocephin IV  Hypokalemia Potassium 3.5 , Add potassium check magnesium Patient history of hypokalemia    Assessment/Plan Active Problems:   Dehydration, mild   Rheumatoid arthritis (HCC)   Syncope   Tachycardia with heart rate 100-120 beats per minute   DM (diabetes mellitus) (HCC)   Chronic diastolic heart failure, NYHA class 1 (HCC)   Chronic diastolic CHF (congestive heart failure) (HCC)   Essential hypertension      DVT prophylaxis: Lovenox Code Status: Full code;DNR need to talk to the daughter Family Communication: Not yet will call the daughter to ambulate Disposition Plan: Discharge home  Consults called: No Admission status: Full code   Linard Millers Ryian Lynde MD [facility]  If 7PM-7AM, please contact night-coverage [email or web address]   08/27/2019, 8:24 AM

## 2019-08-27 NOTE — ED Notes (Signed)
[name] daughter [id number] looking for an update

## 2019-08-27 NOTE — ED Notes (Signed)
Pt bladder scanned, bladder volume 248 mL

## 2019-08-27 NOTE — ED Provider Notes (Signed)
[facility] EMERGENCY DEPARTMENT Provider Note   CSN: [id number] Arrival date & time: 08/27/19  0130     History Chief Complaint  Patient presents with  . Loss of Consciousness    [name] is a 84 y.o. female.  The history is provided by the patient.  Loss of Consciousness Episode history:  Single Timing:  Constant Progression:  Unchanged Chronicity:  New Relieved by:  Nothing Worsened by:  Nothing Associated symptoms: headaches   Associated symptoms: no fever   Patient with history of CHF, COPD frequent UTIs presents with syncopal episode.  She reports she was walking to the bathroom and she felt lightheaded and passed out.  She is unsure if she injured her head.  She reports headache and neck pain.  She also reports right arm pain.  No active chest pain at this time.  She does report recent generalized weakness and difficulty urinating     Past Medical History:  Diagnosis Date  . Allergy   . Anemia    "when I was young"  . Anginal pain ([place])   . Anxiety   . Arthritis    "all over my body"   . Chest pain 04/07/2014  . Chronic diastolic heart failure, NYHA class 1 ([place]) 01/22/2015  . Chronic lower back pain   . Chronic steroid use 12/11/2012  . COPD (chronic obstructive pulmonary disease) ([place]) 01/09/2017  . 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 Elevated lactic acid level 01/22/2015  . Essential hypertension 06/15/2018  . Frequent UTI    "recently" (06/14/2015)  . Gastric mass   . GERD (gastroesophageal reflux disease)   . Headache(784.0)    "weekly; wake up w/them " (110/19/2018)  . Heart murmur   . High cholesterol   . History of blood transfusion ~ 1954   "related to my periods"  . Migraine    "once/month maybe" (05/16/2017)  . Multiple falls 06/14/2015  . Noncompliance 12/11/2012  . OSA (obstructive sleep apnea)    "I think I'm using a BiPAP" (06/14/2015)  . Sepsis ([place]) 01/09/2017  . Syncope and  collapse 12/11/2012  . Tachycardia with heart rate 100-120 beats per minute 04/08/2014  . Type II diabetes mellitus ([place])   . Uncontrolled type 2 diabetes mellitus with complication [facility])     Patient Active Problem List   Diagnosis Date Noted  . DNR (do not resuscitate) 08/02/2019  . Dehydration 08/01/2019  . Urinary tract infection symptoms 07/31/2019  . Diabetic gastroparesis ([place]) 07/31/2019  . Unexplained weight loss 07/12/2019  . Non-intractable vomiting   . Nausea, vomiting, and diarrhea 05/30/2019  . Hypokalemia 05/30/2019  . Hypomagnesemia 05/30/2019  . Gastritis 05/30/2019  . Elevated lipase 05/30/2019  . SIRS (systemic inflammatory response syndrome) ([place]) 05/30/2019  . History of COPD   . Other chronic pain 04/22/2019  . Dyspnea 08/10/2018  . Acute diastolic heart failure ([place]) 06/15/2018  . Essential hypertension 06/15/2018  . 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 Gastric mass   . Sepsis secondary to UTI ([place])   . Uncontrolled type 2 diabetes mellitus with complication ([place])   . AKI (acute kidney injury) ([place])   . Chronic diastolic CHF (congestive heart failure) ([place])   . E-coli UTI 01/09/2017  . Sepsis ([place]) 01/09/2017  . Constipation 01/09/2017  . COPD (chronic obstructive pulmonary disease) ([place]) 01/09/2017  . Multiple falls 06/14/2015  . Weight loss 06/14/2015  . Acute respiratory failure with hypoxia ([place]) 06/14/2015  . Orthostasis 06/14/2015  .  Acute respiratory failure ([place]) 06/14/2015  . Elevated lactic acid level 01/22/2015  . 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 Chronic diastolic heart failure, NYHA class 1 ([place]) 01/22/2015  . Tachycardia with heart rate 100-120 beats per minute 04/08/2014  . DM (diabetes mellitus) ([place]) 04/08/2014  . Chest pain 04/07/2014  . Hyperglycemia 12/11/2012  . Syncope and collapse 12/11/2012  . Dehydration, mild 12/11/2012  . Noncompliance 12/11/2012  . UTI (lower urinary tract infection) 12/11/2012  . Immunosuppression due  to chronic steroid use 12/11/2012  . Rheumatoid arthritis ([place]) 12/11/2012    Past Surgical History:  Procedure Laterality Date  . APPENDECTOMY  1970   Archie Endo 12/12/2010  . BACK SURGERY    . BIOPSY  05/25/2019   Procedure: BIOPSY;  Surgeon: Rush Landmark Telford Nab., MD;  Location: [facility];  Service: Gastroenterology;;  . [place]   Archie Endo 12/12/2010  . ESOPHAGOGASTRODUODENOSCOPY N/A 05/25/2019   Procedure: ESOPHAGOGASTRODUODENOSCOPY (EGD);  Surgeon: Irving Copas., MD;  Location: [facility];  Service: Gastroenterology;  Laterality: N/A;  . EUS N/A 10/16/2017   Procedure: UPPER ENDOSCOPIC ULTRASOUND (EUS) RADIAL;  Surgeon: Milus Banister, MD;  Location: [facility];  Service: Endoscopy;  Laterality: N/A;  . FRACTURE SURGERY    . LAPAROSCOPIC CHOLECYSTECTOMY  1990's  . LUMBAR MICRODISCECTOMY Left 09/2006   L5-S1/notes 12/12/2010  . MULTIPLE EXTRACTIONS WITH ALVEOLOPLASTY Bilateral 06/13/2017   Procedure: MULTIPLE EXTRACTION WITH ALVEOLOPLASTY;  Surgeon: Diona Browner, DDS;  Location: [facility];  Service: Oral Surgery;  Laterality: Bilateral;  . MULTIPLE TOOTH EXTRACTIONS    . ORIF SHOULDER FRACTURE Left ~ 2009   "put cement down in it"   . TONSILLECTOMY  1950's?  . TOTAL ABDOMINAL HYSTERECTOMY  1979   Archie Endo 12/12/2010     OB History   No obstetric history on file.     Family History  Problem Relation Age of Onset  . Hypertension Mother   . Heart attack Father   . Diabetes Mellitus II Sister   . Hypertension Sister   . Cancer - Other Sister   . Migraines Niece   . Colon cancer Neg Hx     Social History   Tobacco Use  . Smoking status: Former Smoker    Types: Cigarettes  . Smokeless tobacco: Never Used  . Tobacco comment: 05/16/2017"1 pack of cigarettes would last 3-4 months; haven't had any cigarettes for 40 years"  Substance Use Topics  . Alcohol use: Not Currently    Comment: 05/16/2017 "used to drink beer in the 1990's; don't  drink at all now"  . Drug use: No    Home Medications Prior to Admission medications   Medication Sig Start Date End Date Taking? Authorizing Provider  albuterol (ACCUNEB) 0.63 MG/3ML nebulizer solution Take 2 ampules by nebulization every 6 (six) hours as needed for wheezing.    [provider]  aspirin EC 81 MG tablet Take 1 tablet (81 mg total) by mouth every Monday, Wednesday, and Friday. 08/04/19   Geradine Girt, DO  atorvastatin (LIPITOR) 10 MG tablet Take 1 Tablet by mouth daily Patient taking differently: Take 10 mg by mouth daily at 6 PM.  07/07/19   Rodriguez-Southworth, Sunday Spillers, PA-C  dicyclomine (BENTYL) 10 MG capsule Take 1 capsule (10 mg total) by mouth every 8 (eight) hours as needed for spasms. 08/23/19   Armbruster, Carlota Raspberry, MD  docusate sodium (COLACE) 100 MG capsule Take 1 capsule (100 mg total) by mouth 2 (two) times daily. 08/03/19   Eulogio Bear  U, DO  glucose blood (ONETOUCH VERIO) test strip Use to check blood sugar 3 to 4 times a day before meals Patient not taking: Reported on 08/11/2019 03/25/19   Rodriguez-Southworth, Sunday Spillers, PA-C  hydrOXYzine (ATARAX/VISTARIL) 10 MG tablet 1 q 8h prn itching Patient taking differently: Take 10 mg by mouth every 8 (eight) hours as needed for itching.  04/22/19   Rodriguez-Southworth, Sunday Spillers, PA-C  lisinopril-hydrochlorothiazide (ZESTORETIC) 10-12.5 MG tablet Take 1 tablet by mouth daily.    [provider]  magnesium chloride (SLOW-MAG) 64 MG TBEC SR tablet Take 1 tablet (64 mg total) by mouth 2 (two) times daily. 08/13/19   Quintella Reichert, MD  methylPREDNISolone (MEDROL) 4 MG tablet Take 4 mg by mouth daily.    Rosita Kea, PA-C  metoCLOPramide (REGLAN) 5 MG tablet Take 1 tablet (5 mg total) by mouth 3 (three) times daily before meals. 08/23/19   Armbruster, Carlota Raspberry, MD  mometasone-formoterol (DULERA) 200-5 MCG/ACT AERO Inhale 2 puffs into the lungs 2 (two) times daily.    [provider]  ondansetron (ZOFRAN  ODT) 4 MG disintegrating tablet Take 1 tablet (4 mg total) by mouth every 8 (eight) hours as needed for nausea or vomiting. Patient not taking: Reported on 08/23/2019 06/10/19   Rodriguez-Southworth, Sunday Spillers, PA-C  pantoprazole (PROTONIX) 40 MG tablet Take 1 tablet (40 mg total) by mouth daily. 08/03/19 10/02/19  Geradine Girt, DO  polyethylene glycol (MIRALAX) 17 g packet Take 17 g by mouth daily. 08/23/19   Armbruster, Carlota Raspberry, MD  Potassium Chloride 40 MEQ/15ML (20%) SOLN Take 20 mEq by mouth daily for 10 days. 08/13/19 08/23/19  Fatima Blank, MD  simethicone (GAS-X EXTRA STRENGTH) 125 MG chewable tablet Chew 125 mg by mouth every 6 (six) hours as needed for flatulence.    [provider]  traMADol (ULTRAM) 50 MG tablet Take 1 tablet (50 mg total) by mouth every 6 (six) hours as needed. Patient not taking: Reported on 08/23/2019 07/15/19   Rodriguez-Southworth, Sunday Spillers, PA-C    Allergies    Patient has no known allergies.  Review of Systems   Review of Systems  Constitutional: Positive for fatigue. Negative for fever.  Cardiovascular: Positive for syncope.  Gastrointestinal: Negative for abdominal pain.  Musculoskeletal: Positive for arthralgias.  Neurological: Positive for headaches.  All other systems reviewed and are negative.   Physical Exam Updated Vital Signs BP (!) 157/83   Pulse (!) 106   Temp 97.7 F (36.5 C) (Oral)   Resp 18   SpO2 98%   Physical Exam CONSTITUTIONAL: Elderly, no acute distress HEAD: Normocephalic/atraumatic, but diffuse tenderness EYES: EOMI/PERRL ENMT: Mucous membranes moist, edentulous NECK: supple no meningeal signs SPINE/BACK: Diffuse cervical spine tenderness, no bruising/crepitance/stepoffs noted to spine, no thoracic or lumbar tenderness CV: S1/S2 noted, no murmurs/rubs/gallops noted LUNGS: Lungs are clear to auscultation bilaterally, no apparent distress ABDOMEN: soft, nontender, no rebound or guarding, bowel sounds noted  throughout abdomen GU:no cva tenderness NEURO: Pt is awake/alert/appropriate, moves all extremitiesx4.  No facial droop.  No arm or leg drift EXTREMITIES: pulses normal/equal, full ROM, diffuse tenderness to right humerus, no deformity, pelvis stable, all other extremities/joints palpated/ranged and nontender SKIN: warm, color normal PSYCH: no abnormalities of mood noted, alert and oriented to situation  ED Results / Procedures / Treatments   Labs (all labs ordered are listed, but only abnormal results are displayed) Labs Reviewed  CBC - Abnormal; Notable for the following components:      Result Value  MCHC 29.4 (*)    RDW 16.8 (*)    All other components within normal limits  COMPREHENSIVE METABOLIC PANEL - Abnormal; Notable for the following components:   CO2 21 (*)    Glucose, Bld 105 (*)    Albumin 3.3 (*)    AST 43 (*)    GFR calc non Af Amer 56 (*)    All other components within normal limits  URINALYSIS, ROUTINE W REFLEX MICROSCOPIC - Abnormal; Notable for the following components:   Ketones, ur 5 (*)    Protein, ur 100 (*)    Leukocytes,Ua SMALL (*)    All other components within normal limits  SARS CORONAVIRUS 2 (TAT 6-24 HRS)  ETHANOL  DIFFERENTIAL  RAPID URINE DRUG SCREEN, HOSP PERFORMED    EKG EKG Interpretation  Date/Time:  Friday August 27 2019 01:54:06 EST Ventricular Rate:  101 PR Interval:    QRS Duration: 77 QT Interval:  347 QTC Calculation: 450 R Axis:   -46 Text Interpretation: Sinus rhythm LAD, consider left anterior fascicular block Nonspecific T abnormalities, lateral leads No significant change since last tracing Confirmed by Ripley Fraise [phone]) on 08/27/2019 2:15:41 AM   Radiology CT HEAD WO CONTRAST  Result Date: 08/27/2019 CLINICAL DATA:  Syncope EXAM: CT HEAD WITHOUT CONTRAST TECHNIQUE: Contiguous axial images were obtained from the base of the skull through the vertex without intravenous contrast. COMPARISON:  05/19/2019 FINDINGS:  Brain: There is atrophy and chronic small vessel disease changes. No acute intracranial abnormality. Specifically, no hemorrhage, hydrocephalus, mass lesion, acute infarction, or significant intracranial injury. Vascular: No hyperdense vessel or unexpected calcification. Skull: No acute calvarial abnormality. Sinuses/Orbits: Visualized paranasal sinuses and mastoids clear. Orbital soft tissues unremarkable. Other: None IMPRESSION: Atrophy, chronic microvascular disease. No acute intracranial abnormality. Electronically Signed   By: Rolm Baptise M.D.   On: 08/27/2019 02:10   CT CERVICAL SPINE WO CONTRAST  Result Date: 08/27/2019 CLINICAL DATA:  Syncope EXAM: CT CERVICAL SPINE WITHOUT CONTRAST TECHNIQUE: Multidetector CT imaging of the cervical spine was performed without intravenous contrast. Multiplanar CT image reconstructions were also generated. COMPARISON:  MRI 06/15/2015 FINDINGS: Alignment: No subluxation Skull base and vertebrae: No acute fracture. No primary bone lesion or focal pathologic process. Soft tissues and spinal canal: No prevertebral fluid or swelling. No visible canal hematoma. Disc levels:  Diffuse degenerative disc and facet disease. Upper chest: No acute findings Other: Carotid artery calcifications. IMPRESSION: Cervical spondylosis.  No acute bony abnormality. Electronically Signed   By: Rolm Baptise M.D.   On: 08/27/2019 02:12   DG Chest Port 1 View  Result Date: 08/27/2019 CLINICAL DATA:  Syncope, weakness for 1 week, denies head strike EXAM: PORTABLE CHEST 1 VIEW COMPARISON:  Radiograph 07/12/2019 FINDINGS: Bandlike area of scarring or atelectasis in the right mid lung with chronic elevation of the right hemidiaphragm. No consolidation, features of edema, pneumothorax, or effusion. The cardiomediastinal contours are unremarkable. No acute osseous or soft tissue abnormality. Degenerative changes are present in the imaged spine and shoulders. Elevation of the right humerus relative  to the glenoid with undersurface remodeling of the acromion likely reflect some rotator cuff insufficiency. Telemetry leads overlie the chest. IMPRESSION: No acute cardiopulmonary abnormality. Electronically Signed   By: Lovena Le M.D.   On: 08/27/2019 02:23   DG Humerus Right  Result Date: 08/27/2019 CLINICAL DATA:  Pain, generalized weakness EXAM: RIGHT HUMERUS - 2+ VIEW COMPARISON:  Concurrent chest radiograph FINDINGS: The osseous structures appear diffusely demineralized which may limit detection of small  or nondisplaced fractures. No acute bony abnormality. Specifically, no fracture, subluxation, or dislocation. Slight elevation of the right humeral head relative to the glenoid with some mild undersurface remodeling of the acromion is compatible with chronic rotator cuff insufficiency. Degenerative changes at the glenohumeral joint are mild with more moderate change at the acromioclavicular joint. Acromioclavicular and coracoclavicular intervals are maintained. The humerus is intact. Alignment at the elbow is grossly preserved though incompletely assessed on this non dedicated radiograph. Benign-appearing soft tissue mineralization seen in the lateral upper arm. IMPRESSION: No acute bony abnormality. Degenerative changes at the glenohumeral and acromioclavicular joints. Findings compatible with chronic rotator cuff insufficiency. Electronically Signed   By: Lovena Le M.D.   On: 08/27/2019 02:26    Procedures Procedures  Medications Ordered in ED Medications  sodium chloride 0.9 % bolus 1,000 mL (1,000 mLs Intravenous New Bag/Given 08/27/19 0442)  fentaNYL (SUBLIMAZE) injection 50 mcg (50 mcg Intravenous Given 08/27/19 0331)  ondansetron (ZOFRAN) injection 4 mg (4 mg Intravenous Given 08/27/19 0331)    ED Course  I have reviewed the triage vital signs and the nursing notes.  Pertinent labs & imaging results that were available during my care of the patient were reviewed by me and  considered in my medical decision making (see chart for details).    MDM Rules/Calculators/A&P                      2:50 AM Patient presents after syncopal episode. Patient also reports generalized weakness.  Labs are pending this time.  Imaging is negative thus far 4:53 AM Labs reveal mild dehydration.  Patient did have tachycardia upon standing Due to history of syncope, age and risk factors, will admit to the hospital. Patient reports continued generalized weakness but no other new complaints.  Updated her daughter via phone.  She request that we called the niece Gaylan Gerold for any further issues that she lives locally Final Clinical Impression(s) / ED Diagnoses Final diagnoses:  Syncope and collapse  Dehydration    Rx / DC Orders ED Discharge Orders    None       Ripley Fraise, MD 08/27/19 [phone]

## 2019-08-27 NOTE — Progress Notes (Signed)
Patient placed in observation after midnight, please see H&P.  This is her 5th hospitalization in 3 months.  I had her last time she was hospitalized and she responded quickly improving with IVF and replacement of electrolytes.  When she left the hospital she was doing remarkable well.  I question if she is taking her medications at home or taking them correctly.  She states she has 2 family members that live with her (a niece and a nephew).  Will need a safe d/c plan once ready as it seems like going home may not be her best option.  She is a DNR so I will change her code status.   PT/OT consult.  Eulogio Bear

## 2019-08-28 DIAGNOSIS — Z681 Body mass index (BMI) 19 or less, adult: Secondary | ICD-10-CM | POA: Diagnosis not present

## 2019-08-28 DIAGNOSIS — E1143 Type 2 diabetes mellitus with diabetic autonomic (poly)neuropathy: Secondary | ICD-10-CM | POA: Diagnosis present

## 2019-08-28 DIAGNOSIS — J449 Chronic obstructive pulmonary disease, unspecified: Secondary | ICD-10-CM | POA: Diagnosis present

## 2019-08-28 DIAGNOSIS — I110 Hypertensive heart disease with heart failure: Secondary | ICD-10-CM | POA: Diagnosis present

## 2019-08-28 DIAGNOSIS — M069 Rheumatoid arthritis, unspecified: Secondary | ICD-10-CM | POA: Diagnosis present

## 2019-08-28 DIAGNOSIS — Z833 Family history of diabetes mellitus: Secondary | ICD-10-CM | POA: Diagnosis not present

## 2019-08-28 DIAGNOSIS — Z66 Do not resuscitate: Secondary | ICD-10-CM | POA: Diagnosis present

## 2019-08-28 DIAGNOSIS — E7800 Pure hypercholesterolemia, unspecified: Secondary | ICD-10-CM | POA: Diagnosis present

## 2019-08-28 DIAGNOSIS — R636 Underweight: Secondary | ICD-10-CM | POA: Diagnosis present

## 2019-08-28 DIAGNOSIS — R55 Syncope and collapse: Secondary | ICD-10-CM | POA: Diagnosis present

## 2019-08-28 DIAGNOSIS — E785 Hyperlipidemia, unspecified: Secondary | ICD-10-CM | POA: Diagnosis present

## 2019-08-28 DIAGNOSIS — Z87891 Personal history of nicotine dependence: Secondary | ICD-10-CM | POA: Diagnosis not present

## 2019-08-28 DIAGNOSIS — K219 Gastro-esophageal reflux disease without esophagitis: Secondary | ICD-10-CM | POA: Diagnosis present

## 2019-08-28 DIAGNOSIS — I5032 Chronic diastolic (congestive) heart failure: Secondary | ICD-10-CM | POA: Diagnosis present

## 2019-08-28 DIAGNOSIS — R627 Adult failure to thrive: Secondary | ICD-10-CM | POA: Diagnosis present

## 2019-08-28 DIAGNOSIS — E876 Hypokalemia: Secondary | ICD-10-CM | POA: Diagnosis present

## 2019-08-28 DIAGNOSIS — I10 Essential (primary) hypertension: Secondary | ICD-10-CM | POA: Diagnosis not present

## 2019-08-28 DIAGNOSIS — R52 Pain, unspecified: Secondary | ICD-10-CM | POA: Diagnosis not present

## 2019-08-28 DIAGNOSIS — Z7982 Long term (current) use of aspirin: Secondary | ICD-10-CM | POA: Diagnosis not present

## 2019-08-28 DIAGNOSIS — Z79899 Other long term (current) drug therapy: Secondary | ICD-10-CM | POA: Diagnosis not present

## 2019-08-28 DIAGNOSIS — Z7951 Long term (current) use of inhaled steroids: Secondary | ICD-10-CM | POA: Diagnosis not present

## 2019-08-28 DIAGNOSIS — K3184 Gastroparesis: Secondary | ICD-10-CM | POA: Diagnosis present

## 2019-08-28 DIAGNOSIS — Z7952 Long term (current) use of systemic steroids: Secondary | ICD-10-CM | POA: Diagnosis not present

## 2019-08-28 DIAGNOSIS — Z20822 Contact with and (suspected) exposure to covid-19: Secondary | ICD-10-CM | POA: Diagnosis present

## 2019-08-28 DIAGNOSIS — E860 Dehydration: Secondary | ICD-10-CM | POA: Diagnosis present

## 2019-08-28 DIAGNOSIS — Z8249 Family history of ischemic heart disease and other diseases of the circulatory system: Secondary | ICD-10-CM | POA: Diagnosis not present

## 2019-08-28 LAB — BASIC METABOLIC PANEL
Anion gap: 10 (ref 5–15)
BUN: 5 mg/dL — ABNORMAL LOW (ref 8–23)
CO2: 20 mmol/L — ABNORMAL LOW (ref 22–32)
Calcium: 8.2 mg/dL — ABNORMAL LOW (ref 8.9–10.3)
Chloride: 111 mmol/L (ref 98–111)
Creatinine, Ser: 0.72 mg/dL (ref 0.44–1.00)
GFR calc Af Amer: >60 mL/min (ref >60)
GFR calc non Af Amer: >60 mL/min (ref >60)
Glucose, Bld: 130 mg/dL — ABNORMAL HIGH (ref 70–99)
Potassium: 3.5 mmol/L (ref 3.5–5.1)
Sodium: 141 mmol/L (ref 135–145)

## 2019-08-28 LAB — CBC
HCT: 31.3 % — ABNORMAL LOW (ref 36.0–46.0)
Hemoglobin: 9.7 g/dL — ABNORMAL LOW (ref 12.0–15.0)
MCH: 28.4 pg (ref 26.0–34.0)
MCHC: 31 g/dL (ref 30.0–36.0)
MCV: 91.5 fL (ref 80.0–100.0)
Platelets: 263 10*3/uL (ref 150–400)
RBC: 3.42 MIL/uL — ABNORMAL LOW (ref 3.87–5.11)
RDW: 16.5 % — ABNORMAL HIGH (ref 11.5–15.5)
WBC: 5 10*3/uL (ref 4.0–10.5)
nRBC: 0 % (ref 0.0–0.2)

## 2019-08-28 LAB — GLUCOSE, CAPILLARY: Glucose-Capillary: 128 mg/dL — ABNORMAL HIGH (ref 70–99)

## 2019-08-28 MED ORDER — FLEET ENEMA 7-19 GM/118ML RE ENEM: 1.0000 | ENEMA | Freq: Once | RECTAL | Status: DC

## 2019-08-28 MED ORDER — ADULT MULTIVITAMIN W/MINERALS CH
1.0000 | ORAL_TABLET | Freq: Every day | ORAL | Status: DC
  Administered 2019-08-29 – 2019-09-03 (×6): 1 via ORAL
  Filled 2019-08-28 (×6): qty 1

## 2019-08-28 NOTE — TOC Initial Note (Addendum)
Transition of Care [facility]) - Initial/Assessment Note    Patient Details  Name: Talijah Chesebro MRN: [id number] Date of Birth: [birth year]  [facility] CM/SW Contact:    Arvella Merles, LCSW Phone Number: [phone] 08/28/2019, 12:54 PM  Clinical Narrative:                 CSW received consult for possible SNF placement at time of discharge. CSW spoke with patient regarding PT recommendation of SNF placement at time of discharge. Patient reported that she is not interested in SNF placement at this time and she has people who live in the home with her that can help. Patient expressed understanding of PT recommendation and would like CSW to follow-up with her in a few days. Patient understands she needs help at home, but she is hoping to get stronger to return home. MD consulted CSW for PCP, per chart review patient is established with internal medicine. MD consulted for Home Health, unlikely to receive due to payor source. Will make referral to remote health program. Weekday CSW able to assist as needed. No further questions reported at this time. CSW to continue to follow and assist with discharge planning needs.  Expected Discharge Plan: [place] Barriers to Discharge: Continued Medical Work up   Patient Goals and CMS Choice Patient states their goals for this hospitalization and ongoing recovery are:: To get stronger   Choice offered to / list presented to : Patient  Expected Discharge Plan and Services Expected Discharge Plan: [place]       Living arrangements for the past 2 months: Single Family Home                                      Prior Living Arrangements/Services Living arrangements for the past 2 months: Single Family Home Lives with:: Relatives   Do you feel safe going back to the place where you live?: Yes      Need for Family Participation in Patient Care: No (Comment) Care giver support system in  place?: Yes (comment)   Criminal Activity/Legal Involvement Pertinent to Current Situation/Hospitalization: No - Comment as needed  Activities of Daily Living Home Assistive Devices/Equipment: None ADL Screening (condition at time of admission) Patient's cognitive ability adequate to safely complete daily activities?: Yes Is the patient deaf or have difficulty hearing?: No Does the patient have difficulty seeing, even when wearing glasses/contacts?: No Does the patient have difficulty concentrating, remembering, or making decisions?: No Patient able to express need for assistance with ADLs?: Yes Does the patient have difficulty dressing or bathing?: No Independently performs ADLs?: Yes (appropriate for developmental age) Does the patient have difficulty walking or climbing stairs?: Yes Weakness of Legs: Both Weakness of Arms/Hands: None  Permission Sought/Granted                  Emotional Assessment   Attitude/Demeanor/Rapport: Unable to Assess Affect (typically observed): Unable to Assess Orientation: : Oriented to Self, Oriented to Place, Oriented to  Time, Oriented to Situation Alcohol / Substance Use: Not Applicable Psych Involvement: No (comment)  Admission diagnosis:  Syncope and collapse [R55] Dehydration [E86.0] Syncope [R55] Pain [R52] Patient Active Problem List   Diagnosis Date Noted  . DNR (do not resuscitate) 08/02/2019  . Dehydration 08/01/2019  . Urinary tract infection symptoms 07/31/2019  . Diabetic gastroparesis ([place]) 07/31/2019  .  Unexplained weight loss 07/12/2019  . Non-intractable vomiting   . Nausea, vomiting, and diarrhea 05/30/2019  . Hypokalemia 05/30/2019  . Hypomagnesemia 05/30/2019  . Gastritis 05/30/2019  . Elevated lipase 05/30/2019  . SIRS (systemic inflammatory response syndrome) ([place]) 05/30/2019  . History of COPD   . Other chronic pain 04/22/2019  . Dyspnea 08/10/2018  . Acute diastolic heart failure ([place]) 06/15/2018  .  Essential hypertension 06/15/2018  . 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 Gastric mass   . Sepsis secondary to UTI ([place])   . Uncontrolled type 2 diabetes mellitus with complication ([place])   . AKI (acute kidney injury) ([place])   . Chronic diastolic CHF (congestive heart failure) ([place])   . E-coli UTI 01/09/2017  . Sepsis ([place]) 01/09/2017  . Constipation 01/09/2017  . COPD (chronic obstructive pulmonary disease) ([place]) 01/09/2017  . Multiple falls 06/14/2015  . Weight loss 06/14/2015  . Acute respiratory failure with hypoxia ([place]) 06/14/2015  . Orthostasis 06/14/2015  . Acute respiratory failure ([place]) 06/14/2015  . Elevated lactic acid level 01/22/2015  . 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 Chronic diastolic heart failure, NYHA class 1 ([place]) 01/22/2015  . Tachycardia with heart rate 100-120 beats per minute 04/08/2014  . DM (diabetes mellitus) ([place]) 04/08/2014  . Chest pain 04/07/2014  . Syncope 04/07/2014  . Hyperglycemia 12/11/2012  . Syncope and collapse 12/11/2012  . Dehydration, mild 12/11/2012  . Noncompliance 12/11/2012  . UTI (lower urinary tract infection) 12/11/2012  . 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 Rheumatoid arthritis ([place]) 12/11/2012   PCP:  [name], PA-C Pharmacy:   [facility] [place], [place] - [address] [place] [address] Phone: [phone] Fax: [phone]     Social Determinants of Health (SDOH) Interventions    Readmission Risk Interventions No flowsheet data found.

## 2019-08-28 NOTE — Progress Notes (Addendum)
Progress Note    [name]  [id number] DOB: [birth year]  DOA: 08/27/2019 PCP: [name], PA-C    Brief Narrative:     Medical records reviewed and are as summarized below:  [name] is an 84 y.o. female with medical history significant of hypertension, CHF, diabetes mellitus type 2, COPD, frequent UTIs, came with a chief complaint of syncopal episode.  She passed out in her way to the bathroom.  She felt lightheaded and passed out. This is her 5th hospitalization in 3 months.  I had her last time she was hospitalized and she responded quickly improving with IVF and replacement of electrolytes.  When she left the hospital she was doing remarkable well.  I question if she is taking her medications at home or taking them correctly as she seems to have FTT at home.   Assessment/Plan:   Active Problems:   Dehydration, mild   Rheumatoid arthritis (HCC)   Syncope   Tachycardia with heart rate 100-120 beats per minute   DM (diabetes mellitus) (HCC)   Chronic diastolic heart failure, NYHA class 1 (HCC)   Chronic diastolic CHF (congestive heart failure) ([place])   Essential hypertension   Syncopal episode Probably vasovagal and dehydration She reports she was walking to the bathroom and she felt lightheaded and passed out -PT eval recommend skilled nursing facility: I suspect patient will decline so will need maxed out home health once ready for discharge  Headache and neck pain CT scan of the head and cervical spine negative -resolved  History of rheumatoid arthritis On Medrol 4 mg daily  History of gastroparesis On Reglan 5 mg 3 times a day -Follows with Dr. Havery Moros as an outpatient  History of COPD Resume inhaler  Hypercholesterolemia Resume Lipitor  Diabetes mellitus type 2 under control No medication listed  Essential hypertension Stable no medication listed  Hypokalemia -Replete as needed -Magnesium low replace  I am  concerned that she is not able to manage at home.  Seems that she has family support but patient does remarkably well in the hospital but quickly decompensates at home requiring further hospitalization  Family Communication/Anticipated D/C date and plan/Code Status   DVT prophylaxis: Lovenox ordered. Code Status: DNR Family Communication:  Disposition Plan: Pending improvement of symptoms and ability to advance diet to regular along with development of a safe discharge plan-- needs continued IVF (only tolerating few sips of clears for now)-- will change to inpatient   Medical Consultants:    None.     Subjective:   Complaining of being cold and needing to have a bowel movement  Objective:    Vitals:   08/28/19 0318 08/28/19 0407 08/28/19 0500 08/28/19 0831  BP: (!) 132/51     Pulse:      Resp: (!) 30  16   Temp: 98.2 F (36.8 C)     TempSrc: Oral     SpO2: 94%   94%  Weight:  50 kg      Intake/Output Summary (Last 24 hours) at 08/28/2019 1242 Last data filed at 08/28/2019 1000 Gross per 24 hour  Intake 600 ml  Output 550 ml  Net 50 ml   Filed Weights   08/27/19 0700 08/28/19 0407  Weight: 51.3 kg 50 kg    Exam: Sitting up today on the side of the bed More interactive and appears to be feeling better Regular rate and rhythm No increased work of breathing Positive bowel sounds soft  Data Reviewed:   I  have personally reviewed following labs and imaging studies:  Labs: Labs show the following:   Basic Metabolic Panel: Recent Labs  Lab 08/27/19 0240 08/28/19 0349  NA 142 141  K 3.5 3.5  CL 107 111  CO2 21* 20*  GLUCOSE 105* 130*  BUN 12 5*  CREATININE 0.95 0.72  CALCIUM 9.1 8.2*  MG 1.5*  --    GFR Estimated Creatinine Clearance: 43.5 mL/min (by C-G formula based on SCr of 0.72 mg/dL). Liver Function Tests: Recent Labs  Lab 08/27/19 0240  AST 43*  ALT 22  ALKPHOS 46  BILITOT 0.7  PROT 6.5  ALBUMIN 3.3*   No results for input(s):  LIPASE, AMYLASE in the last 168 hours. No results for input(s): AMMONIA in the last 168 hours. Coagulation profile No results for input(s): INR, PROTIME in the last 168 hours.  CBC: Recent Labs  Lab 08/27/19 0240 08/28/19 0349  WBC 9.4 5.0  NEUTROABS 6.8  --   HGB 12.0 9.7*  HCT 40.8 31.3*  MCV 96.0 91.5  PLT 328 263   Cardiac Enzymes: No results for input(s): CKTOTAL, CKMB, CKMBINDEX, TROPONINI in the last 168 hours. BNP (last 3 results) No results for input(s): PROBNP in the last 8760 hours. CBG: No results for input(s): GLUCAP in the last 168 hours. D-Dimer: No results for input(s): DDIMER in the last 72 hours. Hgb A1c: No results for input(s): HGBA1C in the last 72 hours. Lipid Profile: No results for input(s): CHOL, HDL, LDLCALC, TRIG, CHOLHDL, LDLDIRECT in the last 72 hours. Thyroid function studies: No results for input(s): TSH, T4TOTAL, T3FREE, THYROIDAB in the last 72 hours.  Invalid input(s): FREET3 Anemia work up: No results for input(s): VITAMINB12, FOLATE, FERRITIN, TIBC, IRON, RETICCTPCT in the last 72 hours. Sepsis Labs: Recent Labs  Lab 08/27/19 0240 08/28/19 0349  WBC 9.4 5.0    Microbiology Recent Results (from the past 240 hour(s))  SARS CORONAVIRUS 2 (TAT 6-24 HRS) Nasopharyngeal Nasopharyngeal Swab     Status: None   Collection Time: 08/27/19  3:24 AM   Specimen: Nasopharyngeal Swab  Result Value Ref Range Status   SARS Coronavirus 2 NEGATIVE NEGATIVE Final    Comment: (NOTE) SARS-CoV-2 target nucleic acids are NOT DETECTED. The SARS-CoV-2 RNA is generally detectable in upper and lower respiratory specimens during the acute phase of infection. Negative results do not preclude SARS-CoV-2 infection, do not rule out co-infections with other pathogens, and should not be used as the sole basis for treatment or other patient management decisions. Negative results must be combined with clinical observations, patient history, and epidemiological  information. The expected result is Negative. Fact Sheet for Patients: SugarRoll.be Fact Sheet for Healthcare Providers: https://www.woods-mathews.com/ This test is not yet approved or cleared by the [place] FDA and  has been authorized for detection and/or diagnosis of SARS-CoV-2 by FDA under an Emergency Use Authorization (EUA). This EUA will remain  in effect (meaning this test can be used) for the duration of the COVID-19 declaration under Section 56 4(b)(1) of the Act, 21 U.S.C. section 360bbb-3(b)(1), unless the authorization is terminated or revoked sooner. Performed at [facility], [place] [address], [place], [place] [address]     Procedures and diagnostic studies:  CT HEAD WO CONTRAST  Result Date: 08/27/2019 CLINICAL DATA:  Syncope EXAM: CT HEAD WITHOUT CONTRAST TECHNIQUE: Contiguous axial images were obtained from the base of the skull through the vertex without intravenous contrast. COMPARISON:  05/19/2019 FINDINGS: Brain: There is atrophy and chronic small  vessel disease changes. No acute intracranial abnormality. Specifically, no hemorrhage, hydrocephalus, mass lesion, acute infarction, or significant intracranial injury. Vascular: No hyperdense vessel or unexpected calcification. Skull: No acute calvarial abnormality. Sinuses/Orbits: Visualized paranasal sinuses and mastoids clear. Orbital soft tissues unremarkable. Other: None IMPRESSION: Atrophy, chronic microvascular disease. No acute intracranial abnormality. Electronically Signed   By: Rolm Baptise M.D.   On: 08/27/2019 02:10   CT CERVICAL SPINE WO CONTRAST  Result Date: 08/27/2019 CLINICAL DATA:  Syncope EXAM: CT CERVICAL SPINE WITHOUT CONTRAST TECHNIQUE: Multidetector CT imaging of the cervical spine was performed without intravenous contrast. Multiplanar CT image reconstructions were also generated. COMPARISON:  MRI 06/15/2015 FINDINGS: Alignment: No subluxation Skull  base and vertebrae: No acute fracture. No primary bone lesion or focal pathologic process. Soft tissues and spinal canal: No prevertebral fluid or swelling. No visible canal hematoma. Disc levels:  Diffuse degenerative disc and facet disease. Upper chest: No acute findings Other: Carotid artery calcifications. IMPRESSION: Cervical spondylosis.  No acute bony abnormality. Electronically Signed   By: Rolm Baptise M.D.   On: 08/27/2019 02:12   DG Chest Port 1 View  Result Date: 08/27/2019 CLINICAL DATA:  Syncope, weakness for 1 week, denies head strike EXAM: PORTABLE CHEST 1 VIEW COMPARISON:  Radiograph 07/12/2019 FINDINGS: Bandlike area of scarring or atelectasis in the right mid lung with chronic elevation of the right hemidiaphragm. No consolidation, features of edema, pneumothorax, or effusion. The cardiomediastinal contours are unremarkable. No acute osseous or soft tissue abnormality. Degenerative changes are present in the imaged spine and shoulders. Elevation of the right humerus relative to the glenoid with undersurface remodeling of the acromion likely reflect some rotator cuff insufficiency. Telemetry leads overlie the chest. IMPRESSION: No acute cardiopulmonary abnormality. Electronically Signed   By: Lovena Le M.D.   On: 08/27/2019 02:23   DG Humerus Right  Result Date: 08/27/2019 CLINICAL DATA:  Pain, generalized weakness EXAM: RIGHT HUMERUS - 2+ VIEW COMPARISON:  Concurrent chest radiograph FINDINGS: The osseous structures appear diffusely demineralized which may limit detection of small or nondisplaced fractures. No acute bony abnormality. Specifically, no fracture, subluxation, or dislocation. Slight elevation of the right humeral head relative to the glenoid with some mild undersurface remodeling of the acromion is compatible with chronic rotator cuff insufficiency. Degenerative changes at the glenohumeral joint are mild with more moderate change at the acromioclavicular joint.  Acromioclavicular and coracoclavicular intervals are maintained. The humerus is intact. Alignment at the elbow is grossly preserved though incompletely assessed on this non dedicated radiograph. Benign-appearing soft tissue mineralization seen in the lateral upper arm. IMPRESSION: No acute bony abnormality. Degenerative changes at the glenohumeral and acromioclavicular joints. Findings compatible with chronic rotator cuff insufficiency. Electronically Signed   By: Lovena Le M.D.   On: 08/27/2019 02:26    Medications:   . aspirin EC  81 mg Oral Q M,W,F  . atorvastatin  10 mg Oral q1800  . docusate sodium  100 mg Oral BID  . enoxaparin (LOVENOX) injection  40 mg Subcutaneous Daily  . feeding supplement  1 Container Oral TID BM  . magnesium chloride  1 tablet Oral BID  . methylPREDNISolone  4 mg Oral Q breakfast  . metoCLOPramide  5 mg Oral TID AC  . mometasone-formoterol  2 puff Inhalation BID  . [START ON 08/29/2019] multivitamin with minerals  1 tablet Oral Daily  . pantoprazole  40 mg Oral Daily  . polyethylene glycol  17 g Oral Daily   Continuous Infusions: . sodium chloride 100  mL/hr at 08/27/19 [id number]     LOS: 0 days   Geradine Girt  [facility]   How to contact the [facility] Attending or Consulting provider [place] or covering provider during after hours [place], for this patient?  1. Check the care team in [facility] and look for a) attending/consulting TRH provider listed and b) the [facility] team listed 2. Log into [email or web address] and use [place]'s universal password to access. If you do not have the password, please contact the hospital operator. 3. Locate the [facility] provider you are looking for under [facility] and page to a number that you can be directly reached. 4. If you still have difficulty reaching the provider, please page the [facility] (Director on Call) for the Hospitalists listed on amion for assistance.  08/28/2019, 12:42 PM

## 2019-08-28 NOTE — Progress Notes (Addendum)
Initial Nutrition Assessment  DOCUMENTATION CODES:   Underweight  INTERVENTION:   Boost Breeze po TID, each supplement provides 250 kcal and 9 grams of protein  MVI daily   Pt likely at moderate refeed risk; recommend monitor K, Mg and P labs daily as oral intake improves  NUTRITION DIAGNOSIS:   Unintentional weight loss related to acute illness as evidenced by 14 percent weight loss in 2 months.  GOAL:   Patient will meet greater than or equal to 90% of their needs  MONITOR:   PO intake, Supplement acceptance, Labs, Weight trends, Skin, I & O's  REASON FOR ASSESSMENT:   Malnutrition Screening Tool    ASSESSMENT:   84 y.o. female with medical history significant of hypertension, CHF, diabetes mellitus type 2, gastroparesis, COPD, frequent UTIs, came with a chief complaint of syncopal episode.  RD working remotely.  Pt s/p EGD 10/27; pt found to have 1cm hiatal hernia, tortulous esophagus, submucosal lesion in the cardia present for >13 years and two mucosal papules in the stomach.  Pt reports intermittent nausea, vomiting and abdominal pain for several months pta. Pt recent diagnosed with gastroparesis and started on reglan. Pt has had decreased appetite and oral intake for several months. Per chart, pt has lost 18lbs(14%) over the past 2 months; this is significant. Pt is ordered for [employer] as she enjoys drinking this. RD will add daily MVI. Pt currently on clear liquid diet.   Pt is at high risk for malnutrition but unable to diagnose at this time as NFPE cannpt be performed.   Medications reviewed and include: aspirin, colace, lovenox, Mg chloride, medrol, reglan, protonix, miralax, NaCl @100ml /hr   Labs reviewed: K 3.5 wnl, BUN 5(L) HGB 9.7(L), Hct 31.3(L) cbgs- 164, 136, 131, 114, 121 x 24 hrs AIC 7.9(H)- 04/2019  Unable to complete Nutrition-Focused physical exam at this time.   Diet Order:   Diet Order            Diet clear liquid Room service  appropriate? Yes; Fluid consistency: Thin  Diet effective now             EDUCATION NEEDS:   Education needs have been addressed  Skin:  Skin Assessment: Reviewed RN Assessment  Last BM:  pta  Height:   Ht Readings from Last 1 Encounters:  08/23/19 5\' 5"  (1.651 m)    Weight:   Wt Readings from Last 1 Encounters:  08/28/19 50 kg    Ideal Body Weight:  56.8 kg  BMI:  Body mass index is 18.34 kg/m.  Estimated Nutritional Needs:   Kcal:  1400-1600kcal/day  Protein:  75-85g/day  Fluid:  >1.3L/day  Koleen Distance MS, RD, LDN Pager #- [phone] Office#- [phone] After Hours Pager: [phone]

## 2019-08-28 NOTE — Care Management Obs Status (Signed)
[place] NOTIFICATION   Patient Details  Name: Zoeyann Graper MRN: [id number] Date of Birth: 1936/02/17   Medicare Observation Status Notification Given:  Yes    Claudie Leach, RN 08/28/2019, 1:30 PM

## 2019-08-28 NOTE — TOC Transition Note (Signed)
Transition of Care [facility]) - CM/SW Discharge Note   Patient Details  Name: Auset Fierst MRN: [id number] Date of Birth: [birth year]  [facility] CM/SW Contact:  Claudie Leach, RN 08/28/2019, 2:32 PM   Clinical Narrative:    Patient refusing SNF and wants to d/c home.  She describes a family member or neighbor checking on her every 2 hours in the morning (5am-12pm) and staying with her between noon and 5am.  Unable to reach nephew.  Patient is agreeable to [facility] services.  Referral accepted by [facility] for PT, OT, RN, HHA, CSW.    Patient has Medicaid and in encouraged to inquire about personal care services through [facility].  HH CSW may be able to assist.      Final next level of care: Home w Home Health Services Barriers to Discharge: Continued Medical Work up   Patient Goals and CMS Choice Patient states their goals for this hospitalization and ongoing recovery are:: To get stronger   Choice offered to / list presented to : Patient   Discharge Plan and Services    HH Arranged: PT, OT, RN, Nurse's Aide, Social Work [employer] Agency: Hand ([place]) Date HH Agency Contacted: 08/28/19 Time [place]: [place] Representative spoke with at [place]: Corene Cornea

## 2019-08-29 DIAGNOSIS — E876 Hypokalemia: Secondary | ICD-10-CM

## 2019-08-29 LAB — BASIC METABOLIC PANEL
Anion gap: 13 (ref 5–15)
BUN: <5 mg/dL — ABNORMAL LOW (ref 8–23)
CO2: 20 mmol/L — ABNORMAL LOW (ref 22–32)
Calcium: 8.3 mg/dL — ABNORMAL LOW (ref 8.9–10.3)
Chloride: 109 mmol/L (ref 98–111)
Creatinine, Ser: 0.57 mg/dL (ref 0.44–1.00)
GFR calc Af Amer: >60 mL/min (ref >60)
GFR calc non Af Amer: >60 mL/min (ref >60)
Glucose, Bld: 90 mg/dL (ref 70–99)
Potassium: 2.9 mmol/L — ABNORMAL LOW (ref 3.5–5.1)
Sodium: 142 mmol/L (ref 135–145)

## 2019-08-29 LAB — CBC
HCT: 30.6 % — ABNORMAL LOW (ref 36.0–46.0)
Hemoglobin: 9.6 g/dL — ABNORMAL LOW (ref 12.0–15.0)
MCH: 28.4 pg (ref 26.0–34.0)
MCHC: 31.4 g/dL (ref 30.0–36.0)
MCV: 90.5 fL (ref 80.0–100.0)
Platelets: 255 10*3/uL (ref 150–400)
RBC: 3.38 MIL/uL — ABNORMAL LOW (ref 3.87–5.11)
RDW: 16.1 % — ABNORMAL HIGH (ref 11.5–15.5)
WBC: 6.8 10*3/uL (ref 4.0–10.5)
nRBC: 0 % (ref 0.0–0.2)

## 2019-08-29 LAB — MAGNESIUM: Magnesium: 1.4 mg/dL — ABNORMAL LOW (ref 1.7–2.4)

## 2019-08-29 MED ORDER — POTASSIUM CHLORIDE CRYS ER 20 MEQ PO TBCR
40.0000 meq | EXTENDED_RELEASE_TABLET | ORAL | Status: AC
  Administered 2019-08-29 (×3): 40 meq via ORAL
  Filled 2019-08-29 (×3): qty 2

## 2019-08-29 MED ORDER — HYDROXYZINE HCL 10 MG PO TABS
10.0000 mg | ORAL_TABLET | Freq: Once | ORAL | Status: AC
  Administered 2019-08-29: 10 mg via ORAL
  Filled 2019-08-29: qty 1

## 2019-08-29 MED ORDER — MAGNESIUM SULFATE 2 GM/50ML IV SOLN
2.0000 g | Freq: Once | INTRAVENOUS | Status: AC
  Administered 2019-08-29: 2 g via INTRAVENOUS
  Filled 2019-08-29: qty 50

## 2019-08-29 MED ORDER — SALINE SPRAY 0.65 % NA SOLN
1.0000 | NASAL | Status: DC | PRN
  Filled 2019-08-29: qty 44

## 2019-08-29 NOTE — Progress Notes (Signed)
Progress Note    [name]  [id number] DOB: 1935-10-09  DOA: 08/27/2019 PCP: [name], PA-C    Brief Narrative:     Medical records reviewed and are as summarized below:  [name] is an 84 y.o. female with medical history significant of hypertension, CHF, diabetes mellitus type 2, COPD, frequent UTIs, came with a chief complaint of syncopal episode.  She passed out in her way to the bathroom.  She felt lightheaded and passed out. This is her 5th hospitalization in 3 months.  I had her last time she was hospitalized and she responded quickly improving with IVF and replacement of electrolytes.  When she left the hospital she was doing remarkable well.  I question if she is taking her medications at home or taking them correctly as she seems to have FTT at home.   Assessment/Plan:   Active Problems:   Dehydration, mild   Rheumatoid arthritis (HCC)   Syncope   Tachycardia with heart rate 100-120 beats per minute   DM (diabetes mellitus) (HCC)   Chronic diastolic heart failure, NYHA class 1 (HCC)   Chronic diastolic CHF (congestive heart failure) (HCC)   Essential hypertension   Syncopal episode Probably vasovagal and dehydration She reports she was walking to the bathroom and she felt lightheaded and passed out Concern she is not managing her medications at home properly so will need home health DC HCTZ  Headache and neck pain CT scan of the head and cervical spine negative -resolved  History of rheumatoid arthritis On Medrol 4 mg daily  History of gastroparesis On Reglan 5 mg 3 times a day -Follows with Dr. Havery Moros as an outpatient  History of COPD Resume inhaler  Hypercholesterolemia Resume Lipitor  Diabetes mellitus type 2 under control No medication listed  Essential hypertension D/c HCTZ  Hypokalemia -Replete as needed -Magnesium low replace    Family Communication/Anticipated D/C date and plan/Code  Status   DVT prophylaxis: Lovenox ordered. Code Status: DNR Family Communication:  Disposition Plan: Home in a.m. with maxed out home health if patient able to tolerate food today, and minimum will need an RN or someone to monitor her medications as I am not sure she is taking them correctly   Medical Consultants:    None.     Subjective:   Did not like the liquid breakfast Felt much better yesterday after the large bowel movement  Objective:    Vitals:   08/28/19 1404 08/28/19 1948 08/29/19 0625 08/29/19 0830  BP:  136/67 129/75   Pulse: 99 (!) 106 95   Resp:  20 16   Temp: 98.4 F (36.9 C) 98.7 F (37.1 C) 98.2 F (36.8 C)   TempSrc: Oral Oral Oral   SpO2: 99% 99% 100% 97%  Weight:        Intake/Output Summary (Last 24 hours) at 08/29/2019 1155 Last data filed at 08/29/2019 0354 Gross per 24 hour  Intake 869.3 ml  Output 750 ml  Net 119.3 ml   Filed Weights   08/27/19 0700 08/28/19 0407  Weight: 51.3 kg 50 kg    Exam: Laying in bed appears to not feel well Regular rate and rhythm No increased work of breathing Alert and cooperative although withdrawn Moves all 4 extremities No lower extremity edema  Data Reviewed:   I have personally reviewed following labs and imaging studies:  Labs: Labs show the following:   Basic Metabolic Panel: Recent Labs  Lab 08/27/19 0240 08/27/19 0240 08/28/19 0349 08/29/19  0311  NA 142  --  141 142  K 3.5   < > 3.5 2.9*  CL 107  --  111 109  CO2 21*  --  20* 20*  GLUCOSE 105*  --  130* 90  BUN 12  --  5* <5*  CREATININE 0.95  --  0.72 0.57  CALCIUM 9.1  --  8.2* 8.3*  MG 1.5*  --   --  1.4*   < > = values in this interval not displayed.   GFR Estimated Creatinine Clearance: 43.5 mL/min (by C-G formula based on SCr of 0.57 mg/dL). Liver Function Tests: Recent Labs  Lab 08/27/19 0240  AST 43*  ALT 22  ALKPHOS 46  BILITOT 0.7  PROT 6.5  ALBUMIN 3.3*   No results for input(s): LIPASE, AMYLASE in  the last 168 hours. No results for input(s): AMMONIA in the last 168 hours. Coagulation profile No results for input(s): INR, PROTIME in the last 168 hours.  CBC: Recent Labs  Lab 08/27/19 0240 08/28/19 0349 08/29/19 0311  WBC 9.4 5.0 6.8  NEUTROABS 6.8  --   --   HGB 12.0 9.7* 9.6*  HCT 40.8 31.3* 30.6*  MCV 96.0 91.5 90.5  PLT 328 263 255   Cardiac Enzymes: No results for input(s): CKTOTAL, CKMB, CKMBINDEX, TROPONINI in the last 168 hours. BNP (last 3 results) No results for input(s): PROBNP in the last 8760 hours. CBG: Recent Labs  Lab 08/28/19 1606  GLUCAP 128*   D-Dimer: No results for input(s): DDIMER in the last 72 hours. Hgb A1c: No results for input(s): HGBA1C in the last 72 hours. Lipid Profile: No results for input(s): CHOL, HDL, LDLCALC, TRIG, CHOLHDL, LDLDIRECT in the last 72 hours. Thyroid function studies: No results for input(s): TSH, T4TOTAL, T3FREE, THYROIDAB in the last 72 hours.  Invalid input(s): FREET3 Anemia work up: No results for input(s): VITAMINB12, FOLATE, FERRITIN, TIBC, IRON, RETICCTPCT in the last 72 hours. Sepsis Labs: Recent Labs  Lab 08/27/19 0240 08/28/19 0349 08/29/19 0311  WBC 9.4 5.0 6.8    Microbiology Recent Results (from the past 240 hour(s))  SARS CORONAVIRUS 2 (TAT 6-24 HRS) Nasopharyngeal Nasopharyngeal Swab     Status: None   Collection Time: 08/27/19  3:24 AM   Specimen: Nasopharyngeal Swab  Result Value Ref Range Status   SARS Coronavirus 2 NEGATIVE NEGATIVE Final    Comment: (NOTE) SARS-CoV-2 target nucleic acids are NOT DETECTED. The SARS-CoV-2 RNA is generally detectable in upper and lower respiratory specimens during the acute phase of infection. Negative results do not preclude SARS-CoV-2 infection, do not rule out co-infections with other pathogens, and should not be used as the sole basis for treatment or other patient management decisions. Negative results must be combined with clinical  observations, patient history, and epidemiological information. The expected result is Negative. Fact Sheet for Patients: SugarRoll.be Fact Sheet for Healthcare Providers: https://www.woods-mathews.com/ This test is not yet approved or cleared by the [place] FDA and  has been authorized for detection and/or diagnosis of SARS-CoV-2 by FDA under an Emergency Use Authorization (EUA). This EUA will remain  in effect (meaning this test can be used) for the duration of the COVID-19 declaration under Section 56 4(b)(1) of the Act, 21 U.S.C. section 360bbb-3(b)(1), unless the authorization is terminated or revoked sooner. Performed at [facility], [place] [address], [place], [place] [address]     Procedures and diagnostic studies:  No results found.  Medications:   . aspirin EC  81 mg Oral Q M,W,F  . atorvastatin  10 mg Oral q1800  . docusate sodium  100 mg Oral BID  . enoxaparin (LOVENOX) injection  40 mg Subcutaneous Daily  . feeding supplement  1 Container Oral TID BM  . magnesium chloride  1 tablet Oral BID  . methylPREDNISolone  4 mg Oral Q breakfast  . metoCLOPramide  5 mg Oral TID AC  . mometasone-formoterol  2 puff Inhalation BID  . multivitamin with minerals  1 tablet Oral Daily  . pantoprazole  40 mg Oral Daily  . polyethylene glycol  17 g Oral Daily  . potassium chloride  40 mEq Oral Q4H  . sodium phosphate  1 enema Rectal Once   Continuous Infusions: . sodium chloride 75 mL/hr at 08/29/19 0017     LOS: 1 day   Geradine Girt  [facility]   How to contact the [facility] Attending or Consulting provider [place] or covering provider during after hours [place], for this patient?  1. Check the care team in [facility] and look for a) attending/consulting TRH provider listed and b) the [facility] team listed 2. Log into [email or web address] and use [place]'s universal password to access. If you do not have the password, please contact  the hospital operator. 3. Locate the [facility] provider you are looking for under [facility] and page to a number that you can be directly reached. 4. If you still have difficulty reaching the provider, please page the [facility] (Director on Call) for the Hospitalists listed on amion for assistance.  08/29/2019, 11:55 AM

## 2019-08-30 DIAGNOSIS — R52 Pain, unspecified: Secondary | ICD-10-CM

## 2019-08-30 DIAGNOSIS — I10 Essential (primary) hypertension: Secondary | ICD-10-CM

## 2019-08-30 LAB — CBC
HCT: 33.2 % — ABNORMAL LOW (ref 36.0–46.0)
Hemoglobin: 10.5 g/dL — ABNORMAL LOW (ref 12.0–15.0)
MCH: 28.5 pg (ref 26.0–34.0)
MCHC: 31.6 g/dL (ref 30.0–36.0)
MCV: 90 fL (ref 80.0–100.0)
Platelets: 284 10*3/uL (ref 150–400)
RBC: 3.69 MIL/uL — ABNORMAL LOW (ref 3.87–5.11)
RDW: 16.5 % — ABNORMAL HIGH (ref 11.5–15.5)
WBC: 8.4 10*3/uL (ref 4.0–10.5)
nRBC: 0 % (ref 0.0–0.2)

## 2019-08-30 LAB — MAGNESIUM: Magnesium: 1.7 mg/dL (ref 1.7–2.4)

## 2019-08-30 LAB — BASIC METABOLIC PANEL
Anion gap: 12 (ref 5–15)
BUN: <5 mg/dL — ABNORMAL LOW (ref 8–23)
CO2: 21 mmol/L — ABNORMAL LOW (ref 22–32)
Calcium: 9 mg/dL (ref 8.9–10.3)
Chloride: 108 mmol/L (ref 98–111)
Creatinine, Ser: 0.57 mg/dL (ref 0.44–1.00)
GFR calc Af Amer: >60 mL/min (ref >60)
GFR calc non Af Amer: >60 mL/min (ref >60)
Glucose, Bld: 101 mg/dL — ABNORMAL HIGH (ref 70–99)
Potassium: 3.8 mmol/L (ref 3.5–5.1)
Sodium: 141 mmol/L (ref 135–145)

## 2019-08-30 MED ORDER — DIPHENHYDRAMINE HCL 25 MG PO CAPS: 25.0000 mg | ORAL_CAPSULE | Freq: Three times a day (TID) | ORAL | Status: DC | PRN

## 2019-08-30 MED ORDER — MAGNESIUM SULFATE 2 GM/50ML IV SOLN
2.0000 g | Freq: Once | INTRAVENOUS | Status: AC
  Administered 2019-08-30: 2 g via INTRAVENOUS
  Filled 2019-08-30: qty 50

## 2019-08-30 MED ORDER — METHYLPREDNISOLONE 4 MG PO TABS
4.0000 mg | ORAL_TABLET | Freq: Once | ORAL | Status: AC
  Administered 2019-08-30: 4 mg via ORAL
  Filled 2019-08-30: qty 1

## 2019-08-30 MED ORDER — CARVEDILOL 6.25 MG PO TABS
6.2500 mg | ORAL_TABLET | Freq: Two times a day (BID) | ORAL | Status: DC
  Administered 2019-08-30 – 2019-09-03 (×9): 6.25 mg via ORAL
  Filled 2019-08-30 (×9): qty 1

## 2019-08-30 MED ORDER — HYDROCODONE-ACETAMINOPHEN 5-325 MG PO TABS
1.0000 | ORAL_TABLET | Freq: Four times a day (QID) | ORAL | Status: DC | PRN
  Administered 2019-08-30 – 2019-09-03 (×8): 1 via ORAL
  Filled 2019-08-30 (×8): qty 1

## 2019-08-30 MED ORDER — ALUM & MAG HYDROXIDE-SIMETH 200-200-20 MG/5ML PO SUSP
15.0000 mL | Freq: Four times a day (QID) | ORAL | Status: DC | PRN
  Administered 2019-08-30 – 2019-09-01 (×2): 15 mL via ORAL
  Filled 2019-08-30 (×2): qty 30

## 2019-08-30 NOTE — Plan of Care (Signed)
  Problem: Education: Goal: Knowledge of General Education information will improve Description Including pain rating scale, medication(s)/side effects and non-pharmacologic comfort measures Outcome: Progressing   

## 2019-08-30 NOTE — Progress Notes (Addendum)
Pt is complaining of generalized discomfort due to arthritis. Pt states she takes a steroid for Rheumatoid arthritis. Per PTA med list, Pt takes Medrol 4mg  daily which was not ordered on admit. Pt is requesting medication to be ordered. Provider on call paged. Will continue to monitor and await new orders. Jessie Foot, RN

## 2019-08-30 NOTE — Progress Notes (Signed)
Progress Note    [name]  [id number] DOB: 1936-03-04  DOA: 08/27/2019 PCP: [name], PA-C    Brief Narrative:     Medical records reviewed and are as summarized below:  [name] is an 84 y.o. female with medical history significant of hypertension, CHF, diabetes mellitus type 2, COPD, frequent UTIs, came with a chief complaint of syncopal episode.  She passed out in her way to the bathroom.  She felt lightheaded and passed out. This is her 5th hospitalization in 3 months.  I had her last time she was hospitalized and she responded quickly improving with IVF and replacement of electrolytes.  When she left the hospital she was doing remarkable well.  I question if she is taking her medications at home or taking them correctly as she seems to have FTT at home.   Assessment/Plan:   Active Problems:   Dehydration, mild   Rheumatoid arthritis (HCC)   Syncope   Tachycardia with heart rate 100-120 beats per minute   DM (diabetes mellitus) (HCC)   Chronic diastolic heart failure, NYHA class 1 (HCC)   Chronic diastolic CHF (congestive heart failure) (HCC)   Essential hypertension   Syncopal episode Probably vasovagal and dehydration She reports she was walking to the bathroom and she felt lightheaded and passed out Concern she is not managing her medications at home properly so will need home health when ready for d/c DC HCTZ  Headache and neck pain CT scan of the head and cervical spine negative -resolved  History of rheumatoid arthritis On Medrol 4 mg daily -was given an extra does last PM and I am not sure why as it is documented that she has gotten every single dose since she has been here but reason to give was missed last 2 doses  History of gastroparesis On Reglan 5 mg 3 times a day -Follows with Dr. Havery Moros as an outpatient  History of COPD Resume inhaler  Hypercholesterolemia Resume Lipitor  Diabetes mellitus type 2  under control No medication listed  Essential hypertension D/c HCTZ  Hypokalemia -Replete as needed -Magnesium low replace    Family Communication/Anticipated D/C date and plan/Code Status   DVT prophylaxis: Lovenox ordered. Code Status: DNR Family Communication:  Disposition Plan: home in AM if pain controlled   Medical Consultants:    None.     Subjective:   eating better but complaining of pain all over  Objective:    Vitals:   08/29/19 1417 08/30/19 0348 08/30/19 0830 08/30/19 0856  BP: (!) 140/97 (!) 148/87 (!) 151/90   Pulse: (!) 103 (!) 110 (!) 111   Resp: 19 14    Temp: 98.7 F (37.1 C) 97.9 F (36.6 C)    TempSrc: Oral Oral    SpO2: 99% 99%  99%  Weight:  49.8 kg      Intake/Output Summary (Last 24 hours) at 08/30/2019 1016 Last data filed at 08/30/2019 0000 Gross per 24 hour  Intake 1292.37 ml  Output --  Net 1292.37 ml   Filed Weights   08/27/19 0700 08/28/19 0407 08/30/19 0348  Weight: 51.3 kg 50 kg 49.8 kg    Exam: In bed, NAD rrr No increased work of breathing Pleasant and cooperative Abnormal joints in hands due to arthritis  Data Reviewed:   I have personally reviewed following labs and imaging studies:  Labs: Labs show the following:   Basic Metabolic Panel: Recent Labs  Lab 08/27/19 0240 08/27/19 0240 08/28/19 0349 08/28/19 0349  08/29/19 0311 08/30/19 0422  NA 142  --  141  --  142 141  K 3.5   < > 3.5   < > 2.9* 3.8  CL 107  --  111  --  109 108  CO2 21*  --  20*  --  20* 21*  GLUCOSE 105*  --  130*  --  90 101*  BUN 12  --  5*  --  <5* <5*  CREATININE 0.95  --  0.72  --  0.57 0.57  CALCIUM 9.1  --  8.2*  --  8.3* 9.0  MG 1.5*  --   --   --  1.4* 1.7   < > = values in this interval not displayed.   GFR Estimated Creatinine Clearance: 43.4 mL/min (by C-G formula based on SCr of 0.57 mg/dL). Liver Function Tests: Recent Labs  Lab 08/27/19 0240  AST 43*  ALT 22  ALKPHOS 46  BILITOT 0.7  PROT 6.5   ALBUMIN 3.3*   No results for input(s): LIPASE, AMYLASE in the last 168 hours. No results for input(s): AMMONIA in the last 168 hours. Coagulation profile No results for input(s): INR, PROTIME in the last 168 hours.  CBC: Recent Labs  Lab 08/27/19 0240 08/28/19 0349 08/29/19 0311 08/30/19 0422  WBC 9.4 5.0 6.8 8.4  NEUTROABS 6.8  --   --   --   HGB 12.0 9.7* 9.6* 10.5*  HCT 40.8 31.3* 30.6* 33.2*  MCV 96.0 91.5 90.5 90.0  PLT 328 263 255 284   Cardiac Enzymes: No results for input(s): CKTOTAL, CKMB, CKMBINDEX, TROPONINI in the last 168 hours. BNP (last 3 results) No results for input(s): PROBNP in the last 8760 hours. CBG: Recent Labs  Lab 08/28/19 1606  GLUCAP 128*   D-Dimer: No results for input(s): DDIMER in the last 72 hours. Hgb A1c: No results for input(s): HGBA1C in the last 72 hours. Lipid Profile: No results for input(s): CHOL, HDL, LDLCALC, TRIG, CHOLHDL, LDLDIRECT in the last 72 hours. Thyroid function studies: No results for input(s): TSH, T4TOTAL, T3FREE, THYROIDAB in the last 72 hours.  Invalid input(s): FREET3 Anemia work up: No results for input(s): VITAMINB12, FOLATE, FERRITIN, TIBC, IRON, RETICCTPCT in the last 72 hours. Sepsis Labs: Recent Labs  Lab 08/27/19 0240 08/28/19 0349 08/29/19 0311 08/30/19 0422  WBC 9.4 5.0 6.8 8.4    Microbiology Recent Results (from the past 240 hour(s))  SARS CORONAVIRUS 2 (TAT 6-24 HRS) Nasopharyngeal Nasopharyngeal Swab     Status: None   Collection Time: 08/27/19  3:24 AM   Specimen: Nasopharyngeal Swab  Result Value Ref Range Status   SARS Coronavirus 2 NEGATIVE NEGATIVE Final    Comment: (NOTE) SARS-CoV-2 target nucleic acids are NOT DETECTED. The SARS-CoV-2 RNA is generally detectable in upper and lower respiratory specimens during the acute phase of infection. Negative results do not preclude SARS-CoV-2 infection, do not rule out co-infections with other pathogens, and should not be used as  the sole basis for treatment or other patient management decisions. Negative results must be combined with clinical observations, patient history, and epidemiological information. The expected result is Negative. Fact Sheet for Patients: SugarRoll.be Fact Sheet for Healthcare Providers: https://www.woods-mathews.com/ This test is not yet approved or cleared by the [place] FDA and  has been authorized for detection and/or diagnosis of SARS-CoV-2 by FDA under an Emergency Use Authorization (EUA). This EUA will remain  in effect (meaning this test can be used) for the duration of the  COVID-19 declaration under Section 56 4(b)(1) of the Act, 21 U.S.C. section 360bbb-3(b)(1), unless the authorization is terminated or revoked sooner. Performed at [facility], [place] [address] [place], [place] [address]     Procedures and diagnostic studies:  No results found.  Medications:   . aspirin EC  81 mg Oral Q M,W,F  . atorvastatin  10 mg Oral q1800  . carvedilol  6.25 mg Oral BID WC  . docusate sodium  100 mg Oral BID  . enoxaparin (LOVENOX) injection  40 mg Subcutaneous Daily  . feeding supplement  1 Container Oral TID BM  . magnesium chloride  1 tablet Oral BID  . methylPREDNISolone  4 mg Oral Q breakfast  . metoCLOPramide  5 mg Oral TID AC  . mometasone-formoterol  2 puff Inhalation BID  . multivitamin with minerals  1 tablet Oral Daily  . pantoprazole  40 mg Oral Daily  . polyethylene glycol  17 g Oral Daily  . sodium phosphate  1 enema Rectal Once   Continuous Infusions: . sodium chloride 50 mL/hr at 08/29/19 1616     LOS: 2 days   Geradine Girt  [facility]   How to contact the [facility] Attending or Consulting provider [place] or covering provider during after hours [place], for this patient?  1. Check the care team in [facility] and look for a) attending/consulting [facility] provider listed and b) the [facility] team listed 2. Log into  [email or web address] and use [place]'s universal password to access. If you do not have the password, please contact the hospital operator. 3. Locate the [facility] provider you are looking for under [facility] and page to a number that you can be directly reached. 4. If you still have difficulty reaching the provider, please page the [facility] (Director on Call) for the Hospitalists listed on amion for assistance.  08/30/2019, 10:16 AM

## 2019-08-30 NOTE — Care Management Important Message (Signed)
Important Message  Patient Details  Name: [name] MRN: [id number] Date of Birth: 12/13/1935   Medicare Important Message Given:  Yes     Shelda Altes 08/30/2019, 2:10 PM

## 2019-08-30 NOTE — Progress Notes (Signed)
Physical Therapy Treatment Patient Details Name: [name] MRN: [id number] DOB: May 10, 1936 Today's Date: 08/30/2019    History of Present Illness 84 yo female admitted to ED with syncope with x3 falls at home, dehydration, generalized weakness. CT head and neck negative for acute abnormalities. PMH includes CHF, COPD, UTIs, anxiety, falls, DMII, L5-S1 lumbar discectomy, RA, migraines.    PT Comments    Patient received in bed, asleep but easily woken and willing to participate in PT today. See below for mobility/assist levels. Overall showed great improvement from last session, only really needed MinA for all mobility but did need to give cues for pausing and taking a minute after positional changes for safety. Did need quite a bit of help navigating in room and steering RW however does have some visual deficits that would explain this. Very easily fatigued but reports she really did not do a lot at baseline. VSS on RA. She was left up in the chair with all needs met, chair alarm active and RN aware of patient status.    Follow Up Recommendations  SNF;Supervision/Assistance - 24 hour(refusing SNF)     Equipment Recommendations  None recommended by PT    Recommendations for Other Services       Precautions / Restrictions Precautions Precautions: Fall Precaution Comments: + orthostatic in ED Restrictions Weight Bearing Restrictions: No    Mobility  Bed Mobility Overal bed mobility: Needs Assistance Bed Mobility: Supine to Sit     Supine to sit: Min guard        Transfers Overall transfer level: Needs assistance Equipment used: Rolling walker (2 wheeled) Transfers: Sit to/from Stand Sit to Stand: Min assist         General transfer comment: MinA to boost to full upright position, needed to stand for a minute or so to get balance and let initial dizziness resolve  Ambulation/Gait Ambulation/Gait assistance: Min assist Gait Distance (Feet): 10 Feet Assistive  device: Rolling walker (2 wheeled) Gait Pattern/deviations: Step-through pattern;Decreased step length - right;Decreased step length - left;Decreased stride length;Drifts right/left Gait velocity: decreased   General Gait Details: MinA for navigation of RW and balance, second person to manage lines; very easily fatigued and reports she really did not do much at baseline   Stairs             Wheelchair Mobility    Modified Rankin (Stroke Patients Only)       Balance Overall balance assessment: Needs assistance;History of Falls Sitting-balance support: Single extremity supported;Feet supported Sitting balance-Leahy Scale: Fair Sitting balance - Comments: S-min guard for safety   Standing balance support: Bilateral upper extremity supported Standing balance-Leahy Scale: Poor Standing balance comment: reliant on external support                            Cognition Arousal/Alertness: Awake/alert Behavior During Therapy: Flat affect Overall Cognitive Status: Impaired/Different from baseline Area of Impairment: Attention;Memory;Following commands;Safety/judgement;Problem solving                   Current Attention Level: Sustained Memory: Decreased short-term memory Following Commands: Follows one step commands consistently;Follows one step commands with increased time Safety/Judgement: Decreased awareness of safety;Decreased awareness of deficits   Problem Solving: Slow processing;Decreased initiation;Requires verbal cues General Comments: continues to require increased time for processing and reduced awareness of deficits, cognition overall improved today      Exercises      General Comments General comments (skin integrity,  edema, etc.): VSS, mild transitional dizziness but this resolved with rest breaks after position changes      Pertinent Vitals/Pain Pain Assessment: No/denies pain Pain Score: 0-No pain Pain Intervention(s): Limited  activity within patient's tolerance;Monitored during session    Home Living                      Prior Function            PT Goals (current goals can now be found in the care plan section) Acute Rehab PT Goals Patient Stated Goal: to get stronger  PT Goal Formulation: With patient Time For Goal Achievement: 09/10/19 Potential to Achieve Goals: Fair Progress towards PT goals: Progressing toward goals    Frequency    Min 3X/week      PT Plan Current plan remains appropriate    Co-evaluation              AM-PAC PT "6 Clicks" Mobility   Outcome Measure  Help needed turning from your back to your side while in a flat bed without using bedrails?: A Little Help needed moving from lying on your back to sitting on the side of a flat bed without using bedrails?: A Little Help needed moving to and from a bed to a chair (including a wheelchair)?: A Little Help needed standing up from a chair using your arms (e.g., wheelchair or bedside chair)?: A Little Help needed to walk in hospital room?: A Little Help needed climbing 3-5 steps with a railing? : A Lot 6 Click Score: 17    End of Session Equipment Utilized During Treatment: Gait belt Activity Tolerance: Patient limited by fatigue Patient left: in chair;with call bell/phone within reach;with chair alarm set Nurse Communication: Mobility status PT Visit Diagnosis: Muscle weakness (generalized) (M62.81);Difficulty in walking, not elsewhere classified (R26.2);History of falling (Z91.81)     Time: 6967-8938 PT Time Calculation (min) (ACUTE ONLY): 19 min  Charges:  $Gait Training: 8-22 mins                     Windell Norfolk, DPT, PN1   Supplemental Physical Therapist [place]    Pager [phone] Acute Rehab Office [phone]

## 2019-08-31 MED ORDER — DICLOFENAC SODIUM 1 % EX GEL
2.0000 g | Freq: Four times a day (QID) | CUTANEOUS | Status: DC
  Administered 2019-08-31 – 2019-09-03 (×13): 2 g via TOPICAL
  Filled 2019-08-31: qty 100

## 2019-08-31 NOTE — Progress Notes (Signed)
Occupational Therapy Treatment Patient Details Name: [name] MRN: [id number] DOB: 1935-12-23 Today's Date: 08/31/2019    History of present illness 84 yo female admitted to ED with syncope with x3 falls at home, dehydration, generalized weakness. CT head and neck negative for acute abnormalities. PMH includes CHF, COPD, UTIs, anxiety, falls, DMII, L5-S1 lumbar discectomy, RA, migraines.   OT comments  Pt making gradual progress towards OT goals this session. Session focus on functional mobility to increase activity tolerance for BADLs. Overall, pt requires min guard- min a for bed mobility and stand pivot transfer from EOB>recliner with RW. Pt with flat affect throughout session noted to be slow to process needing verbal cues to sequence bed mobility. Pt slightly confused stating she was at [place] long and disoriented to day and year. Pt able to complete LB dressing from EOB with supervision. Pt reports her niece lives with her but she works during the day. DC plan currently remains appropriate based on CLOF and limited family support at home. Will continue to follow acutely per POC and update DC recs as needed.    Follow Up Recommendations  SNF;Supervision/Assistance - 24 hour    Equipment Recommendations  None recommended by OT    Recommendations for Other Services      Precautions / Restrictions Precautions Precautions: Fall Precaution Comments: + orthostatic in ED Restrictions Weight Bearing Restrictions: No       Mobility Bed Mobility Overal bed mobility: Needs Assistance Bed Mobility: Supine to Sit     Supine to sit: Min guard;HOB elevated     General bed mobility comments: cues to sequence steps, min guard for hand placement, increased time and effort noted  Transfers Overall transfer level: Needs assistance Equipment used: Rolling walker (2 wheeled) Transfers: Sit to/from Stand Sit to Stand: Min assist         General transfer comment: MIN A to power up  into standing, min guard for safety upon initial stand. cues for hand placement on RW    Balance Overall balance assessment: Needs assistance;History of Falls Sitting-balance support: Feet supported Sitting balance-Leahy Scale: Fair Sitting balance - Comments: able to reach to feet with no LOB with supervision   Standing balance support: Bilateral upper extremity supported Standing balance-Leahy Scale: Poor Standing balance comment: reliant on external support                           ADL either performed or assessed with clinical judgement   ADL Overall ADL's : Needs assistance/impaired Eating/Feeding: Minimal assistance;Sitting Eating/Feeding Details (indicate cue type and reason): assist to cut larger food items as pt is on soft diet- RN aware         Lower Body Bathing: Supervison/ safety;Sitting/lateral leans Lower Body Bathing Details (indicate cue type and reason): simulated from EOB     Lower Body Dressing: Supervision/safety;Sitting/lateral leans Lower Body Dressing Details (indicate cue type and reason): able to pull up socks from EOB with supervision for safety Toilet Transfer: Min guard;Minimal assistance;RW;Stand-pivot Toilet Transfer Details (indicate cue type and reason): simulated with RW to recliner. min guard- min a as pt needed assist intermittently to manage RW and MIN A to power up into standing         Functional mobility during ADLs: Min guard;Minimal assistance;Rolling walker General ADL Comments: session focus on functional mobility to increase activity tolerance fro BADLs and LB dressing     Vision       Perception  Praxis      Cognition Arousal/Alertness: Awake/alert Behavior During Therapy: Flat affect Overall Cognitive Status: Impaired/Different from baseline Area of Impairment: Attention;Following commands;Problem solving;Orientation                 Orientation Level: Disoriented to;Time;Place;Situation(state she  is at [facility] long and disoriented to day of week and year) Current Attention Level: Sustained   Following Commands: Follows one step commands consistently;Follows one step commands with increased time     Problem Solving: Slow processing;Decreased initiation;Requires verbal cues General Comments: pt requires increased time to process needing verbal cues for safety awareness. pt very flat throughout session. would not let therapist open blinds        Exercises     Shoulder Instructions       General Comments vss stable throughout session    Pertinent Vitals/ Pain       Pain Assessment: No/denies pain  Home Living                                          Prior Functioning/Environment              Frequency  Min 3X/week        Progress Toward Goals  OT Goals(current goals can now be found in the care plan section)  Progress towards OT goals: Progressing toward goals  Acute Rehab OT Goals Patient Stated Goal: to get stronger  Time For Goal Achievement: 09/10/19 Potential to Achieve Goals: Good  Plan Discharge plan remains appropriate    Co-evaluation                 AM-PAC OT "6 Clicks" Daily Activity     Outcome Measure   Help from another person eating meals?: A Little Help from another person taking care of personal grooming?: A Little Help from another person toileting, which includes using toliet, bedpan, or urinal?: A Lot Help from another person bathing (including washing, rinsing, drying)?: A Lot Help from another person to put on and taking off regular upper body clothing?: A Little Help from another person to put on and taking off regular lower body clothing?: A Little 6 Click Score: 16    End of Session Equipment Utilized During Treatment: Rolling walker  OT Visit Diagnosis: Muscle weakness (generalized) (M62.81);Cognitive communication deficit (R41.841)   Activity Tolerance Patient tolerated treatment well    Patient Left in chair;with call bell/phone within reach;with chair alarm set;Other (comment)(eating lunch)   Nurse Communication Mobility status;Other (comment)(cut up food items as pt in on soft diet)        Time: [id number] OT Time Calculation (min): 15 min  Charges: OT General Charges $OT Visit: 1 Visit OT Treatments $Self Care/Home Management : 8-22 mins  Lanier Clam., COTA/L Acute Rehabilitation Services [phone] [place] 08/31/2019, 12:40 PM

## 2019-08-31 NOTE — Progress Notes (Signed)
Patient OOB for dinner, appetite remains poor, ate 25%. Requesting to return to bed after 40 minutes, states she is tired and back hurts sitting up.  Returned to bed and pain medication given.

## 2019-08-31 NOTE — Progress Notes (Addendum)
Progress Note    [name]  [id number] DOB: [birth year]  DOA: 08/27/2019 PCP: [name], PA-C    Brief Narrative:     Medical records reviewed and are as summarized below:  [name] is an 84 y.o. female with medical history significant of hypertension, CHF, diabetes mellitus type 2, COPD, frequent UTIs, came with a chief complaint of syncopal episode.  She passed out in her way to the bathroom.  She felt lightheaded and passed out. This is her 5th hospitalization in 3 months.  I had her last time she was hospitalized and she responded quickly improving with IVF and replacement of electrolytes.  When she left the hospital she was doing remarkable well.  I question if she is taking her medications at home or taking them correctly as she seems to have FTT at home.   Assessment/Plan:   Active Problems:   Dehydration, mild   Rheumatoid arthritis (HCC)   Syncope   Tachycardia with heart rate 100-120 beats per minute   DM (diabetes mellitus) (HCC)   Chronic diastolic heart failure, NYHA class 1 (HCC)   Chronic diastolic CHF (congestive heart failure) ([place])   Essential hypertension   Syncopal episode Probably vasovagal and dehydration She reports she was walking to the bathroom and she felt lightheaded and passed out Concern she is not managing her medications at home properly so will need home health when ready for d/c DC HCTZ (this was also DC'd during last admission but appears she resumed taking)  Headache and neck pain CT scan of the head and cervical spine negative -resolved  History of rheumatoid arthritis On Medrol 4 mg daily -was given an extra does last PM and I am not sure why as it is documented that she has gotten every single dose since she has been here but reason to give was missed last 2 doses -P.o. Norco as needed  History of gastroparesis On Reglan 5 mg 3 times a day -Follows with Dr. Havery Moros as an outpatient  History  of COPD Resume inhaler  Hypercholesterolemia Resume Lipitor  Diabetes mellitus type 2 under control No medication listed  Essential hypertension D/c HCTZ, this was also discharged on last admission but it appears patient resumed taking  Hypokalemia/hypomagnesemia -Replete as needed -Magnesium low replace    underweight Estimated body mass index is 18.34 kg/m as calculated from the following:   Height as of this encounter: 5\' 5"  (1.651 m).   Weight as of this encounter: 50 kg.    Family Communication/Anticipated D/C date and plan/Code Status   DVT prophylaxis: Lovenox ordered. Code Status: DNR Family Communication: Called nephew, no answer Disposition Plan: home in AM if pain controlled and patient has a proper family support   Medical Consultants:    None.     Subjective:   Complaining of pain Nurse reports patient is unsteady  Objective:    Vitals:   08/30/19 2045 08/31/19 0301 08/31/19 0307 08/31/19 0744  BP:   127/63 (!) 132/55  Pulse:   88 93  Resp:   (!) 23 20  Temp: 98.2 F (36.8 C)  98.7 F (37.1 C)   TempSrc: Oral  Oral   SpO2:   97%   Weight:  50 kg    Height:        Intake/Output Summary (Last 24 hours) at 08/31/2019 1252 Last data filed at 08/31/2019 1006 Gross per 24 hour  Intake 1633.08 ml  Output 550 ml  Net 1083.08 ml  Filed Weights   08/28/19 0407 08/30/19 0348 08/31/19 0301  Weight: 50 kg 49.8 kg 50 kg    Exam: In bed, appears uncomfortable Regular rate and rhythm No increased work of breathing  Data Reviewed:   I have personally reviewed following labs and imaging studies:  Labs: Labs show the following:   Basic Metabolic Panel: Recent Labs  Lab 08/27/19 0240 08/27/19 0240 08/28/19 0349 08/28/19 0349 08/29/19 0311 08/30/19 0422  NA 142  --  141  --  142 141  K 3.5   < > 3.5   < > 2.9* 3.8  CL 107  --  111  --  109 108  CO2 21*  --  20*  --  20* 21*  GLUCOSE 105*  --  130*  --  90 101*  BUN 12  --   5*  --  <5* <5*  CREATININE 0.95  --  0.72  --  0.57 0.57  CALCIUM 9.1  --  8.2*  --  8.3* 9.0  MG 1.5*  --   --   --  1.4* 1.7   < > = values in this interval not displayed.   GFR Estimated Creatinine Clearance: 43.5 mL/min (by C-G formula based on SCr of 0.57 mg/dL). Liver Function Tests: Recent Labs  Lab 08/27/19 0240  AST 43*  ALT 22  ALKPHOS 46  BILITOT 0.7  PROT 6.5  ALBUMIN 3.3*   No results for input(s): LIPASE, AMYLASE in the last 168 hours. No results for input(s): AMMONIA in the last 168 hours. Coagulation profile No results for input(s): INR, PROTIME in the last 168 hours.  CBC: Recent Labs  Lab 08/27/19 0240 08/28/19 0349 08/29/19 0311 08/30/19 0422  WBC 9.4 5.0 6.8 8.4  NEUTROABS 6.8  --   --   --   HGB 12.0 9.7* 9.6* 10.5*  HCT 40.8 31.3* 30.6* 33.2*  MCV 96.0 91.5 90.5 90.0  PLT 328 263 255 284   Cardiac Enzymes: No results for input(s): CKTOTAL, CKMB, CKMBINDEX, TROPONINI in the last 168 hours. BNP (last 3 results) No results for input(s): PROBNP in the last 8760 hours. CBG: Recent Labs  Lab 08/28/19 1606  GLUCAP 128*   D-Dimer: No results for input(s): DDIMER in the last 72 hours. Hgb A1c: No results for input(s): HGBA1C in the last 72 hours. Lipid Profile: No results for input(s): CHOL, HDL, LDLCALC, TRIG, CHOLHDL, LDLDIRECT in the last 72 hours. Thyroid function studies: No results for input(s): TSH, T4TOTAL, T3FREE, THYROIDAB in the last 72 hours.  Invalid input(s): FREET3 Anemia work up: No results for input(s): VITAMINB12, FOLATE, FERRITIN, TIBC, IRON, RETICCTPCT in the last 72 hours. Sepsis Labs: Recent Labs  Lab 08/27/19 0240 08/28/19 0349 08/29/19 0311 08/30/19 0422  WBC 9.4 5.0 6.8 8.4    Microbiology Recent Results (from the past 240 hour(s))  SARS CORONAVIRUS 2 (TAT 6-24 HRS) Nasopharyngeal Nasopharyngeal Swab     Status: None   Collection Time: 08/27/19  3:24 AM   Specimen: Nasopharyngeal Swab  Result Value Ref  Range Status   SARS Coronavirus 2 NEGATIVE NEGATIVE Final    Comment: (NOTE) SARS-CoV-2 target nucleic acids are NOT DETECTED. The SARS-CoV-2 RNA is generally detectable in upper and lower respiratory specimens during the acute phase of infection. Negative results do not preclude SARS-CoV-2 infection, do not rule out co-infections with other pathogens, and should not be used as the sole basis for treatment or other patient management decisions. Negative results must be combined with clinical  observations, patient history, and epidemiological information. The expected result is Negative. Fact Sheet for Patients: SugarRoll.be Fact Sheet for Healthcare Providers: https://www.woods-mathews.com/ This test is not yet approved or cleared by the [place] FDA and  has been authorized for detection and/or diagnosis of SARS-CoV-2 by FDA under an Emergency Use Authorization (EUA). This EUA will remain  in effect (meaning this test can be used) for the duration of the COVID-19 declaration under Section 56 4(b)(1) of the Act, 21 U.S.C. section 360bbb-3(b)(1), unless the authorization is terminated or revoked sooner. Performed at [facility], [place] [address], [place], [place] [address]     Procedures and diagnostic studies:  No results found.  Medications:   . aspirin EC  81 mg Oral Q M,W,F  . atorvastatin  10 mg Oral q1800  . carvedilol  6.25 mg Oral BID WC  . diclofenac Sodium  2 g Topical QID  . docusate sodium  100 mg Oral BID  . enoxaparin (LOVENOX) injection  40 mg Subcutaneous Daily  . feeding supplement  1 Container Oral TID BM  . magnesium chloride  1 tablet Oral BID  . methylPREDNISolone  4 mg Oral Q breakfast  . metoCLOPramide  5 mg Oral TID AC  . mometasone-formoterol  2 puff Inhalation BID  . multivitamin with minerals  1 tablet Oral Daily  . pantoprazole  40 mg Oral Daily  . polyethylene glycol  17 g Oral Daily  .  sodium phosphate  1 enema Rectal Once   Continuous Infusions:    LOS: 3 days   Geradine Girt  [facility]   How to contact the [facility] Attending or Consulting provider [place] or covering provider during after hours [place], for this patient?  1. Check the care team in [facility] and look for a) attending/consulting [facility] provider listed and b) the [facility] team listed 2. Log into [email or web address] and use [place]'s universal password to access. If you do not have the password, please contact the hospital operator. 3. Locate the [facility] provider you are looking for under [facility] and page to a number that you can be directly reached. 4. If you still have difficulty reaching the provider, please page the [facility] (Director on Call) for the Hospitalists listed on amion for assistance.  08/31/2019, 12:52 PM

## 2019-08-31 NOTE — Progress Notes (Signed)
Patient OOB to chair for lunch, but continues to have poor appetite. Ate 15% of lunch despite encouragement.  Requesting to return to bed after 30 minutes, encouraged to stay up for an hour.  States she feels tired and sitting up hurts her back. Patient refused [employer], states she is not hungry and it tastes too sweet.  Will continue to encourage increased activity and increased po  intake.

## 2019-09-01 ENCOUNTER — Inpatient Hospital Stay (HOSPITAL_COMMUNITY): Payer: Medicare Other

## 2019-09-01 MED ORDER — SODIUM CHLORIDE 0.9 % IV SOLN: INTRAVENOUS | Status: AC

## 2019-09-01 MED ORDER — METOCLOPRAMIDE HCL 5 MG/ML IJ SOLN
5.0000 mg | Freq: Three times a day (TID) | INTRAMUSCULAR | Status: DC
  Administered 2019-09-01 – 2019-09-03 (×6): 5 mg via INTRAVENOUS
  Filled 2019-09-01 (×6): qty 2

## 2019-09-01 NOTE — Progress Notes (Signed)
PT Cancellation Note  Patient Details Name: [name] MRN: [id number] DOB: 1935/12/06   Cancelled Treatment:    Reason Eval/Treat Not Completed: Patient declined, no reason specified pt back in bed, RN reports she was having severe pain from her back while being in the chair. Pt now also nauseous and does not feel up to PT. Will attempt to return on next date of service at this point.    Windell Norfolk, DPT, PN1   Supplemental Physical Therapist [facility]    Pager [phone] Acute Rehab Office [phone]

## 2019-09-01 NOTE — Plan of Care (Signed)
  Problem: Education: Goal: Knowledge of General Education information will improve Description Including pain rating scale, medication(s)/side effects and non-pharmacologic comfort measures Outcome: Progressing   

## 2019-09-01 NOTE — Progress Notes (Signed)
PT Cancellation Note  Patient Details Name: [name] MRN: [id number] DOB: 29-Dec-1935   Cancelled Treatment:    Reason Eval/Treat Not Completed: Other (comment) patient just finishing with OT and fatigued. Agreeable to sitting in chair to rest until 11:30, PT will return at this time and assist her with walking and then back to bed.    Windell Norfolk, DPT, PN1   Supplemental Physical Therapist [facility]    Pager [phone] Acute Rehab Office [phone]

## 2019-09-01 NOTE — Progress Notes (Addendum)
Occupational Therapy Treatment Patient Details Name: [name] MRN: [id number] DOB: 1935-11-16 Today's Date: 09/01/2019    History of present illness 84 yo female admitted to ED with syncope with x3 falls at home, dehydration, generalized weakness. CT head and neck negative for acute abnormalities. PMH includes CHF, COPD, UTIs, anxiety, falls, DMII, L5-S1 lumbar discectomy, RA, migraines.   OT comments  Pt making slow progress with functional goals. Resting HR 99-100, increasing to 106 during activity. Pt confused, disoriented to time, date, situation. Pt continues to refuse SNF although MD came in and also continued to recommend and encourage ST SNF rehab. MD to call pt's family to check on d/c arrangements. Session focused on functional mobility with RW, toilet transfers, toileting, grooming and UB ADLs. Pt requires increased time to process instructions, needing verbal cues for safety awareness. Poor awareness of deficits, makes conflciting statements about amount of assist and sup she will have at home. OT will continue to follow acutely  Follow Up Recommendations  SNF;Supervision/Assistance - 24 hour(is refusing SNF)    Equipment Recommendations  None recommended by OT    Recommendations for Other Services      Precautions / Restrictions Precautions Precautions: Fall Restrictions Weight Bearing Restrictions: No       Mobility Bed Mobility Overal bed mobility: Needs Assistance Bed Mobility: Supine to Sit;Sit to Supine     Supine to sit: Min guard;HOB elevated Sit to supine: Min guard   General bed mobility comments: cues to sequence steps, min guard for hand placement, increased time and effort, no physical assiste required  Transfers Overall transfer level: Needs assistance Equipment used: Rolling walker (2 wheeled) Transfers: Sit to/from Stand Sit to Stand: Min assist         General transfer comment: min A to power up, min guard A to BSC with RW and back to  bed. Pt later agreed to get back OOB and sit in recliner.    Balance Overall balance assessment: Needs assistance;History of Falls Sitting-balance support: Feet supported Sitting balance-Leahy Scale: Fair     Standing balance support: Bilateral upper extremity supported;During functional activity Standing balance-Leahy Scale: Poor                             ADL either performed or assessed with clinical judgement   ADL Overall ADL's : Needs assistance/impaired     Grooming: Wash/dry hands;Wash/dry face;Minimal assistance;Standing   Upper Body Bathing: Minimal assistance;Sitting Upper Body Bathing Details (indicate cue type and reason): simulated     Upper Body Dressing : Minimal assistance;Sitting       Toilet Transfer: Minimal assistance;Min guard;RW;Ambulation;Stand-pivot;BSC;Cueing for sequencing;Cueing for safety           Functional mobility during ADLs: Minimal assistance;Min guard;Rolling walker;Cueing for safety;Cueing for sequencing       Vision Patient Visual Report: No change from baseline     Perception     Praxis      Cognition Arousal/Alertness: Awake/alert Behavior During Therapy: Flat affect Overall Cognitive Status: Impaired/Different from baseline Area of Impairment: Attention;Following commands;Problem solving;Orientation                 Orientation Level: Disoriented to;Time;Place;Situation   Memory: Decreased short-term memory Following Commands: Follows one step commands consistently;Follows one step commands with increased time Safety/Judgement: Decreased awareness of safety;Decreased awareness of deficits   Problem Solving: Slow processing;Decreased initiation;Requires verbal cues General Comments: pt requires increased time to process needing verbal cues for  safety awareness. Poor awareness of deficits, makes conflciting statements about amount of assist and sup she will have at home        Exercises      Shoulder Instructions       General Comments      Pertinent Vitals/ Pain       Pain Assessment: No/denies pain Pain Score: 0-No pain Pain Intervention(s): Monitored during session;Repositioned  Home Living                                          Prior Functioning/Environment              Frequency  Min 3X/week        Progress Toward Goals  OT Goals(current goals can now be found in the care plan section)  Progress towards OT goals: Progressing toward goals  Acute Rehab OT Goals Patient Stated Goal: to get stronger, go home  Plan Discharge plan remains appropriate    Co-evaluation                 AM-PAC OT "6 Clicks" Daily Activity     Outcome Measure   Help from another person eating meals?: None Help from another person taking care of personal grooming?: A Little Help from another person toileting, which includes using toliet, bedpan, or urinal?: A Lot Help from another person bathing (including washing, rinsing, drying)?: A Lot Help from another person to put on and taking off regular upper body clothing?: A Little Help from another person to put on and taking off regular lower body clothing?: A Little 6 Click Score: 17    End of Session Equipment Utilized During Treatment: Rolling walker;Gait belt;Other (comment)(BSC)  OT Visit Diagnosis: Muscle weakness (generalized) (M62.81);Cognitive communication deficit (R41.841)   Activity Tolerance Patient tolerated treatment well   Patient Left in chair;with call bell/phone within reach;with chair alarm set   Nurse Communication          Time: [phone] OT Time Calculation (min): 25 min  Charges: OT General Charges $OT Visit: 1 Visit OT Treatments $Self Care/Home Management : 8-22 mins $Therapeutic Activity: 8-22 mins     Britt Bottom 09/01/2019, 1:06 PM

## 2019-09-01 NOTE — Progress Notes (Signed)
PROGRESS NOTE    [name]  [id number] DOB: 12-21-1935 DOA: 08/27/2019 PCP: [name], PA-C   Brief Narrative:  [phone] with HTN, CHF, diabetes mellitus type 2, COPD, frequent UTI, history of RA on chronic prednisone, history of tachycardia, history of gastroparesis recently seen by Dr. Havery Moros and had endoscopy - no gross lesions came to the ER for evaluation of syncopal episode.  She had 5 hospitalizations in the last 3 months.  Patient apparently passed out on the way to her bathroom.  She felt lightheaded and passed out, was not sure if she injured her head was having some neck pain and headache.  She complained of mild dysuria. In the ER CT head and neck no acute finding, COVID-19 negative, UA questionable UTI.  Patient is admitted for further management.  Subjective: Seen this morning nursing reports patient was nauseous has barely eaten anything refusing to take anything by orally.  She was on room air alert awake, appears weak and frail. Lab shows bicarb 21 potassium stable. Afebrile T-max 98.7.  Tachypneic at 33 this morning, appears anxious  Assessment & Plan:  Syncopal episode suspecting combination of vasovagal episode and related dehydration.  CT head and neck no acute finding.  Recent echocardiogram  In 2020 lvef was 65%. On last discharge HCTZ was discontinued but it seems patient was taking it.  HCTZ discontinued, will need education compliance counseling.  Today refusing to take anything by mouth, at risk for dehydration.  Nausea, refusing to eat orally, has mild lower abdominal tenderness on exam: will obtain ct abdomen, add gentle ivf.  Encourage oral hydration, change oral to IV Reglan. Cont  supportive care.  Continue nausea medication, Protonix. Diabetic gastroparesis: Patient is having nausea refusing to eat anything, on Reglan we will switch to IV, continue PPI. Recently seen by Dr. Havery Moros and had endoscopy - no gross lesions.  Consult  dietitian augment nutrition BMI borderline low at 18   Dehydration:Continue gentle IV fluids.  Diabetes mellitus type 2/with history of gastroparesis: HB1C fairly stable 7.9 on October 2020, blood sugar controlled, continue sliding scale insulin.  Rheumatoid arthritis on chronic steroid: Continue home dose, continue home Norco.  Continue PPI while on chronic steroid.  HLD continue statin.  COPD not in exacerbation continue inhaler.  Chronic diastolic heart failure, NYHA class 1: Compensated.  Monitor.   Essential hypertension: BP controlled.  Continue Coreg.  Discontinued HCTZ again.  Nutrition: Nutrition Problem: Unintentional weight loss Etiology: acute illness Signs/Symptoms: percent weight loss Percent weight loss: 14 %   Body mass index is 18.35 kg/m.   DVT prophylaxis:Lovenox Code Status: DNR  Family Communication: plan of care discussed with patient at bedside. I called Albert-Nephew- no answer, then called Niece Bethena Roys gust plan of care with her.  She is going to talk to  Ms Pilat about going to skilled nursing facility. Disposition Plan: Patient is from:home. Anticipated d/c [id number] w HH.She needs SNF per PT-but refusing. PT Advised 24 hr supervision- but . SNF versus home health. I called and spoke with Per Niece She has an aid that was supposed to come soon. Nephew works 7-5 pm-she is by herself during that time. Niece gets off at 1 and checks on her but does not liver with her.  They have been trying to have her placed but patient gets really upset when talking about going to skilled nursing facility. Barriers to d/c or conditions once able to eat orally and once safe for d/c.   Consultants:None  Procedures:see note  Microbiology:see note Antimicrobials: Anti-infectives (From admission, onward)   Start     Dose/Rate Route Frequency Ordered Stop   08/27/19 0845  cefTRIAXone (ROCEPHIN) 1 g in sodium chloride 0.9 % 100 mL IVPB  Status:  Discontinued     1 g 200 mL/hr  over 30 Minutes Intravenous Every 24 hours 08/27/19 0835 08/27/19 0850      Medications: Scheduled Meds: . aspirin EC  81 mg Oral Q M,W,F  . atorvastatin  10 mg Oral q1800  . carvedilol  6.25 mg Oral BID WC  . diclofenac Sodium  2 g Topical QID  . docusate sodium  100 mg Oral BID  . enoxaparin (LOVENOX) injection  40 mg Subcutaneous Daily  . feeding supplement  1 Container Oral TID BM  . magnesium chloride  1 tablet Oral BID  . methylPREDNISolone  4 mg Oral Q breakfast  . metoCLOPramide  5 mg Oral TID AC  . mometasone-formoterol  2 puff Inhalation BID  . multivitamin with minerals  1 tablet Oral Daily  . pantoprazole  40 mg Oral Daily  . polyethylene glycol  17 g Oral Daily  . sodium phosphate  1 enema Rectal Once   Continuous Infusions:  Objective: Vitals:   09/01/19 0259 09/01/19 0841 09/01/19 0849 09/01/19 0850  BP: (!) 142/74  (!) 157/80 (!) 157/80  Pulse: 90 93 94   Resp: 17 17  (!) 33  Temp: 98.1 F (36.7 C)     TempSrc: Oral     SpO2: 96% 92%    Weight: 50 kg     Height:        Intake/Output Summary (Last 24 hours) at 09/01/2019 1156 Last data filed at 08/31/2019 1800 Gross per 24 hour  Intake 220 ml  Output --  Net 220 ml   Filed Weights   08/30/19 0348 08/31/19 0301 09/01/19 0259  Weight: 49.8 kg 50 kg 50 kg   Weight change: 0.045 kg  Body mass index is 18.35 kg/m.  Intake/Output from previous day: 02/02 0701 - 02/03 0700 In: 693.2 [P.O.:340; I.V.:353.2] Out: 225 [Urine:225] Intake/Output this shift: No intake/output data recorded.  Examination:  General exam: AAO,Frail, on room air nauseus HEENT:Oral mucosa moist, Ear/Nose WNL grossly, dentition normal. Respiratory system: Bilaterally clear breath sounds, no wheezing or crackles,no use of accessory muscle Cardiovascular system: S1 & S2 +, No JVD,. Gastrointestinal system: Abdomen soft, NT,ND, BS+ Nervous System:Alert, awake, moving extremities and grossly nonfocal Extremities: No edema, distal  peripheral pulses palpable.  Skin: No rashes,no icterus. MSK: Normal muscle bulk,tone, power  Data Reviewed: I have personally reviewed following labs and imaging studies  CBC: Recent Labs  Lab 08/27/19 0240 08/28/19 0349 08/29/19 0311 08/30/19 0422  WBC 9.4 5.0 6.8 8.4  NEUTROABS 6.8  --   --   --   HGB 12.0 9.7* 9.6* 10.5*  HCT 40.8 31.3* 30.6* 33.2*  MCV 96.0 91.5 90.5 90.0  PLT 328 263 255 [id number]   Basic Metabolic Panel: Recent Labs  Lab 08/27/19 0240 08/28/19 0349 08/29/19 0311 08/30/19 0422  NA 142 141 142 141  K 3.5 3.5 2.9* 3.8  CL 107 111 109 108  CO2 21* 20* 20* 21*  GLUCOSE 105* 130* 90 101*  BUN 12 5* <5* <5*  CREATININE 0.95 0.72 0.57 0.57  CALCIUM 9.1 8.2* 8.3* 9.0  MG 1.5*  --  1.4* 1.7   GFR: Estimated Creatinine Clearance: 43.5 mL/min (by C-G formula based on SCr of 0.57 mg/dL). Liver Function Tests: Recent Labs  Lab 08/27/19 0240  AST 43*  ALT 22  ALKPHOS 46  BILITOT 0.7  PROT 6.5  ALBUMIN 3.3*   No results for input(s): LIPASE, AMYLASE in the last 168 hours. No results for input(s): AMMONIA in the last 168 hours. Coagulation Profile: No results for input(s): INR, PROTIME in the last 168 hours. Cardiac Enzymes: No results for input(s): CKTOTAL, CKMB, CKMBINDEX, TROPONINI in the last 168 hours. BNP (last 3 results) No results for input(s): PROBNP in the last 8760 hours. HbA1C: No results for input(s): HGBA1C in the last 72 hours. CBG: Recent Labs  Lab 08/28/19 1606  GLUCAP 128*   Lipid Profile: No results for input(s): CHOL, HDL, LDLCALC, TRIG, CHOLHDL, LDLDIRECT in the last 72 hours. Thyroid Function Tests: No results for input(s): TSH, T4TOTAL, FREET4, T3FREE, THYROIDAB in the last 72 hours. Anemia Panel: No results for input(s): VITAMINB12, FOLATE, FERRITIN, TIBC, IRON, RETICCTPCT in the last 72 hours. Sepsis Labs: No results for input(s): PROCALCITON, LATICACIDVEN in the last 168 hours.  Recent Results (from the past 240  hour(s))  SARS CORONAVIRUS 2 (TAT 6-24 HRS) Nasopharyngeal Nasopharyngeal Swab     Status: None   Collection Time: 08/27/19  3:24 AM   Specimen: Nasopharyngeal Swab  Result Value Ref Range Status   SARS Coronavirus 2 NEGATIVE NEGATIVE Final    Comment: (NOTE) SARS-CoV-2 target nucleic acids are NOT DETECTED. The SARS-CoV-2 RNA is generally detectable in upper and lower respiratory specimens during the acute phase of infection. Negative results do not preclude SARS-CoV-2 infection, do not rule out co-infections with other pathogens, and should not be used as the sole basis for treatment or other patient management decisions. Negative results must be combined with clinical observations, patient history, and epidemiological information. The expected result is Negative. Fact Sheet for Patients: SugarRoll[employer]be Fact Sheet for Healthcare Providers: https://www.woods-mathews.com/ This test is not yet approved or cleared by the [place] FDA and  has been authorized for detection and/or diagnosis of SARS-CoV-2 by FDA under an Emergency Use Authorization (EUA). This EUA will remain  in effect (meaning this test can be used) for the duration of the COVID-19 declaration under Section 56 4(b)(1) of the Act, 21 U.S.C. section 360bbb-3(b)(1), unless the authorization is terminated or revoked sooner. Performed at [facility], [place] [address] [place], [place] [address]       Radiology Studies: No results found.   LOS: 4 days   Time spent: More than 50% of that time was spent in counseling and/or coordination of care.  Antonieta Pert, MD [facility]  09/01/2019, 11:56 AM

## 2019-09-02 LAB — BASIC METABOLIC PANEL
Anion gap: 8 (ref 5–15)
BUN: 7 mg/dL — ABNORMAL LOW (ref 8–23)
CO2: 23 mmol/L (ref 22–32)
Calcium: 8.7 mg/dL — ABNORMAL LOW (ref 8.9–10.3)
Chloride: 109 mmol/L (ref 98–111)
Creatinine, Ser: 0.72 mg/dL (ref 0.44–1.00)
GFR calc Af Amer: >60 mL/min (ref >60)
GFR calc non Af Amer: >60 mL/min (ref >60)
Glucose, Bld: 98 mg/dL (ref 70–99)
Potassium: 3.3 mmol/L — ABNORMAL LOW (ref 3.5–5.1)
Sodium: 140 mmol/L (ref 135–145)

## 2019-09-02 LAB — TROPONIN I (HIGH SENSITIVITY)
Troponin I (High Sensitivity): 37 ng/L — ABNORMAL HIGH (ref <18)
Troponin I (High Sensitivity): 41 ng/L — ABNORMAL HIGH (ref <18)

## 2019-09-02 MED ORDER — POTASSIUM CHLORIDE 10 MEQ/100ML IV SOLN
10.0000 meq | INTRAVENOUS | Status: DC
  Administered 2019-09-02: 10 meq via INTRAVENOUS
  Filled 2019-09-02: qty 100

## 2019-09-02 MED ORDER — CARVEDILOL 6.25 MG PO TABS: 6.2500 mg | ORAL_TABLET | Freq: Two times a day (BID) | ORAL | 1 refills | Status: DC

## 2019-09-02 MED ORDER — ONDANSETRON 4 MG PO TBDP: 4.0000 mg | ORAL_TABLET | Freq: Three times a day (TID) | ORAL | 0 refills | Status: AC | PRN

## 2019-09-02 MED ORDER — POTASSIUM CHLORIDE 10 MEQ/100ML IV SOLN
10.0000 meq | INTRAVENOUS | Status: DC
  Filled 2019-09-02: qty 100

## 2019-09-02 MED ORDER — PANTOPRAZOLE SODIUM 40 MG PO TBEC: 40.0000 mg | DELAYED_RELEASE_TABLET | Freq: Every day | ORAL | 0 refills | Status: DC

## 2019-09-02 MED ORDER — ALUM & MAG HYDROXIDE-SIMETH 200-200-20 MG/5ML PO SUSP
30.0000 mL | Freq: Once | ORAL | Status: AC
  Administered 2019-09-02: 30 mL via ORAL
  Filled 2019-09-02: qty 30

## 2019-09-02 MED ORDER — POTASSIUM CHLORIDE CRYS ER 20 MEQ PO TBCR
20.0000 meq | EXTENDED_RELEASE_TABLET | Freq: Once | ORAL | Status: AC
  Administered 2019-09-02: 20 meq via ORAL
  Filled 2019-09-02: qty 1

## 2019-09-02 NOTE — Discharge Summary (Deleted)
Physician Discharge Summary  Pietra Thorne [id number] DOB: [birth year] DOA: 08/27/2019  PCP: [name], PA-C  Admit date: 08/27/2019 Discharge date: 09/02/2019  Admitted From: home Disposition:  HHHPT  Recommendations for Outpatient Follow-up:  1. Follow up with PCP in 1-2 weeks 2. Please obtain BMP/CBC in one week 3. Please follow up on the following pending results:  Home Health:YES  Equipment/Devices: None  Discharge Condition: Stable Code Status:DNR Diet recommendation: Heart Healthy  Brief/Interim Summary:  27yof with HTN, CHF, diabetes mellitus type 2, COPD, frequent UTI, history of RA on chronic prednisone, history of tachycardia, history of gastroparesis recently seen by Dr. Havery Moros and had endoscopy - no gross lesions came to the ER for evaluation of syncopal episode.  She had 5 hospitalizations in the last 3 months.  Patient apparently passed out on the way to her bathroom.  She felt lightheaded and passed out, was not sure if she injured her head was having some neck pain and headache.  She complained of mild dysuria. In the ER CT head and neck no acute finding, COVID-19 negative, UA questionable UTI.  Patient is admitted for further management. Patient has a hydrate with IV fluids her medications were adjusted discontinued HCTZ lisinopril and placed on Coreg.  Patient was seen by PT OT skilled nursing facility was advised. Patient is adamantly refusing placement and wants to go home.  Discussed this with patient's niece and she understands that patient has made decision not to go to facility and she wants to return home.  They have been trying for her to go to facility. At this time patient is tolerating diet well stopped IV fluids can stable for discharge with home health and will set up PT RN and aide and social worker.  Discharge Diagnoses:   Syncopal episode suspecting combination of vasovagal episode and related dehydration.  CT head and neck no  acute finding.  Recent echocardiogram  In 2020 lvef was 65%. On last discharge HCTZ was discontinued but it seems patient was taking it.  HCTZ and lisinopril discontinued-set up home health RN to monitor her medication. At this time stable medically. BP stable, cont home meds , encouraged to cont oral hydration  Nausea-resolve.d ate well. Cont home Reglan,Nausea meds and ppi CT-scan of the abdomen pelvis- no acute findings 2/3 Diabetic gastroparesis:cont plan as above. Recently seen by Dr. Havery Moros and had endoscopy - no gross lesions.  cont to augment nutrition  Hypokalemia replaced w/ oral and IV potassium chloride.  Dehydration:d/c ivf, cont po  Diabetes mellitus type 2/with history of gastroparesis: HB1C fairly stable 7.9 on October 2020, blood sugar controlled, continue home meds  Rheumatoid arthritis on chronic steroid: Continue home dose, continue home Norco.  Continue PPI while on chronic steroid.  HLD continue statin.  COPD not in exacerbation continue inhaler.  Chronic diastolic heart failure, NYHA class 1: Compensated.  Monitor.   Essential hypertension: BP controlled. Continue Coreg- new- off  HCTZ/LISINOPRIL due to dehydration risk  Nutrition: Nutrition Problem: Unintentional weight loss Etiology: acute illness Signs/Symptoms: percent weight loss Percent weight loss: 14 % Body mass index is 18.35 kg/m.   DVT prophylaxis:Lovenox Code Status: DNR  Family Communication: discussed w niece. Dispo:Home w [place] refsued SNF. Explained to niece, at high risk for readmission.   Consults:  none  Subjective: Ate all her breakfast, no nausea,vomitting, feels good. Wants to go hoem today. Refusing adamantly to go to SNF. States she will have someone with her all the time  Discharge Exam: Vitals:  09/02/19 0752 09/02/19 0857  BP:  135/68  Pulse: 83 81  Resp: 16   Temp:    SpO2: 92%    General: Pt is alert, awake, not in acute distress Cardiovascular: RRR,  S1/S2 +, no rubs, no gallops Respiratory: CTA bilaterally, no wheezing, no rhonchi Abdominal: Soft, NT, ND, bowel sounds + Extremities: no edema, no cyanosis  Discharge Instructions  Discharge Instructions    Diet - low sodium heart healthy   Complete by: As directed    Discharge instructions   Complete by: As directed    Please call call MD or return to ER for similar or worsening recurring problem that brought you to hospital or if any fever,nausea/vomiting,abdominal pain, uncontrolled pain, chest pain,  shortness of breath or any other alarming symptoms.  DO NOT TAKE YOUR LISINOPRIL AND HCTZ.  Please follow-up your doctor as instructed in a week time and call the office for appointment.  Please avoid alcohol, smoking, or any other illicit substance and maintain healthy habits including taking your regular medications as prescribed.  You were cared for by a hospitalist during your hospital stay. If you have any questions about your discharge medications or the care you received while you were in the hospital after you are discharged, you can call the unit and ask to speak with the hospitalist on call if the hospitalist that took care of you is not available.  Once you are discharged, your primary care physician will handle any further medical issues. Please note that NO REFILLS for any discharge medications will be authorized once you are discharged, as it is imperative that you return to your primary care physician (or establish a relationship with a primary care physician if you do not have one) for your aftercare needs so that they can reassess your need for medications and monitor your lab values   Increase activity slowly   Complete by: As directed      Allergies as of 09/02/2019      Reactions   Lactose Intolerance (gi)       Medication List    STOP taking these medications   lisinopril-hydrochlorothiazide 10-12.5 MG tablet Commonly known as: ZESTORETIC     TAKE these  medications   albuterol 0.63 MG/3ML nebulizer solution Commonly known as: ACCUNEB Take 2 ampules by nebulization every 6 (six) hours as needed for wheezing.   aspirin EC 81 MG tablet Take 1 tablet (81 mg total) by mouth every Monday, Wednesday, and Friday.   atorvastatin 10 MG tablet Commonly known as: Lipitor Take 1 Tablet by mouth daily What changed:   how much to take  how to take this  when to take this  additional instructions   carvedilol 6.25 MG tablet Commonly known as: COREG Take 1 tablet (6.25 mg total) by mouth 2 (two) times daily with a meal.   dicyclomine 10 MG capsule Commonly known as: BENTYL Take 1 capsule (10 mg total) by mouth every 8 (eight) hours as needed for spasms.   docusate sodium 100 MG capsule Commonly known as: COLACE Take 1 capsule (100 mg total) by mouth 2 (two) times daily.   Dulera 200-5 MCG/ACT Aero Generic drug: mometasone-formoterol Inhale 2 puffs into the lungs 2 (two) times daily.   Gas-X Extra Strength 125 MG chewable tablet Generic drug: simethicone Chew 125 mg by mouth every 6 (six) hours as needed for flatulence.   hydrOXYzine 10 MG tablet Commonly known as: ATARAX/VISTARIL 1 q 8h prn itching What changed:  how much to take  how to take this  when to take this  reasons to take this  additional instructions   magnesium chloride 64 MG Tbec SR tablet Commonly known as: SLOW-MAG Take 1 tablet (64 mg total) by mouth 2 (two) times daily.   methylPREDNISolone 4 MG tablet Commonly known as: MEDROL Take 4 mg by mouth daily.   metoCLOPramide 5 MG tablet Commonly known as: REGLAN Take 1 tablet (5 mg total) by mouth 3 (three) times daily before meals.   ondansetron 4 MG disintegrating tablet Commonly known as: Zofran ODT Take 1 tablet (4 mg total) by mouth every 8 (eight) hours as needed for up to 30 doses for nausea or vomiting.   OneTouch Verio test strip Generic drug: glucose blood Use to check blood sugar 3 to  4 times a day before meals   pantoprazole 40 MG tablet Commonly known as: PROTONIX Take 1 tablet (40 mg total) by mouth daily for 30 doses.   polyethylene glycol 17 g packet Commonly known as: MiraLax Take 17 g by mouth daily.   Potassium Chloride 40 MEQ/15ML (20%) Soln Take 20 mEq by mouth daily for 10 days.   traMADol 50 MG tablet Commonly known as: ULTRAM Take 1 tablet (50 mg total) by mouth every 6 (six) hours as needed.      Follow-up Information    Rodriguez-Southworth, Sunday Spillers, PA-C Follow up in 1 week(s).   Specialties: Internal Medicine, Emergency Medicine Contact information: [address] [place] [address] [phone]          Allergies  Allergen Reactions  . Lactose Intolerance (Gi)     The results of significant diagnostics from this hospitalization (including imaging, microbiology, ancillary and laboratory) are listed below for reference.    Microbiology: Recent Results (from the past 240 hour(s))  SARS CORONAVIRUS 2 (TAT 6-24 HRS) Nasopharyngeal Nasopharyngeal Swab     Status: None   Collection Time: 08/27/19  3:24 AM   Specimen: Nasopharyngeal Swab  Result Value Ref Range Status   SARS Coronavirus 2 NEGATIVE NEGATIVE Final    Comment: (NOTE) SARS-CoV-2 target nucleic acids are NOT DETECTED. The SARS-CoV-2 RNA is generally detectable in upper and lower respiratory specimens during the acute phase of infection. Negative results do not preclude SARS-CoV-2 infection, do not rule out co-infections with other pathogens, and should not be used as the sole basis for treatment or other patient management decisions. Negative results must be combined with clinical observations, patient history, and epidemiological information. The expected result is Negative. Fact Sheet for Patients: SugarRoll.be Fact Sheet for Healthcare Providers: https://www.woods-mathews.com/ This test is not yet approved or  cleared by the [place] FDA and  has been authorized for detection and/or diagnosis of SARS-CoV-2 by FDA under an Emergency Use Authorization (EUA). This EUA will remain  in effect (meaning this test can be used) for the duration of the COVID-19 declaration under Section 56 4(b)(1) of the Act, 21 U.S.C. section 360bbb-3(b)(1), unless the authorization is terminated or revoked sooner. Performed at [facility], [place] [address] [place], [place] [address]     Procedures/Studies: CT ABDOMEN PELVIS WO CONTRAST  Result Date: 09/01/2019 CLINICAL DATA:  Lower abdominal pain. Acute kidney injury. EXAM: CT ABDOMEN AND PELVIS WITHOUT CONTRAST TECHNIQUE: Multidetector CT imaging of the abdomen and pelvis was performed following the standard protocol without IV contrast. COMPARISON:  08/13/2019 FINDINGS: Lower chest: No acute findings. Hepatobiliary: No mass visualized on this unenhanced exam. Prior cholecystectomy. No  evidence of biliary obstruction. Pancreas: No mass or inflammatory process visualized on this unenhanced exam. Spleen:  Within normal limits in size. Adrenals/Urinary tract: No evidence of urolithiasis or hydronephrosis. Unremarkable unopacified urinary bladder. Stomach/Bowel: No evidence of obstruction, inflammatory process, or abnormal fluid collections. Vascular/Lymphatic: No pathologically enlarged lymph nodes identified. No evidence of abdominal aortic aneurysm. Aortic atherosclerosis incidentally noted. Reproductive: Prior hysterectomy noted. Adnexal regions are unremarkable in appearance. Other: Benign-appearing calcification in the anterior left upper quadrant remains stable. Musculoskeletal: No suspicious bone lesions identified. Stable old compression fracture deformity of the L4 vertebral body and previous vertebroplasties at T11 and T12. IMPRESSION: No evidence of urolithiasis, hydronephrosis, or other acute findings. Electronically Signed   By: Marlaine Hind M.D.   On:  09/01/2019 17:05   CT HEAD WO CONTRAST  Result Date: 08/27/2019 CLINICAL DATA:  Syncope EXAM: CT HEAD WITHOUT CONTRAST TECHNIQUE: Contiguous axial images were obtained from the base of the skull through the vertex without intravenous contrast. COMPARISON:  05/19/2019 FINDINGS: Brain: There is atrophy and chronic small vessel disease changes. No acute intracranial abnormality. Specifically, no hemorrhage, hydrocephalus, mass lesion, acute infarction, or significant intracranial injury. Vascular: No hyperdense vessel or unexpected calcification. Skull: No acute calvarial abnormality. Sinuses/Orbits: Visualized paranasal sinuses and mastoids clear. Orbital soft tissues unremarkable. Other: None IMPRESSION: Atrophy, chronic microvascular disease. No acute intracranial abnormality. Electronically Signed   By: Rolm Baptise M.D.   On: 08/27/2019 02:10   CT CERVICAL SPINE WO CONTRAST  Result Date: 08/27/2019 CLINICAL DATA:  Syncope EXAM: CT CERVICAL SPINE WITHOUT CONTRAST TECHNIQUE: Multidetector CT imaging of the cervical spine was performed without intravenous contrast. Multiplanar CT image reconstructions were also generated. COMPARISON:  MRI 06/15/2015 FINDINGS: Alignment: No subluxation Skull base and vertebrae: No acute fracture. No primary bone lesion or focal pathologic process. Soft tissues and spinal canal: No prevertebral fluid or swelling. No visible canal hematoma. Disc levels:  Diffuse degenerative disc and facet disease. Upper chest: No acute findings Other: Carotid artery calcifications. IMPRESSION: Cervical spondylosis.  No acute bony abnormality. Electronically Signed   By: Rolm Baptise M.D.   On: 08/27/2019 02:12   DG Chest Port 1 View  Result Date: 08/27/2019 CLINICAL DATA:  Syncope, weakness for 1 week, denies head strike EXAM: PORTABLE CHEST 1 VIEW COMPARISON:  Radiograph 07/12/2019 FINDINGS: Bandlike area of scarring or atelectasis in the right mid lung with chronic elevation of the right  hemidiaphragm. No consolidation, features of edema, pneumothorax, or effusion. The cardiomediastinal contours are unremarkable. No acute osseous or soft tissue abnormality. Degenerative changes are present in the imaged spine and shoulders. Elevation of the right humerus relative to the glenoid with undersurface remodeling of the acromion likely reflect some rotator cuff insufficiency. Telemetry leads overlie the chest. IMPRESSION: No acute cardiopulmonary abnormality. Electronically Signed   By: Lovena Le M.D.   On: 08/27/2019 02:23   DG Humerus Right  Result Date: 08/27/2019 CLINICAL DATA:  Pain, generalized weakness EXAM: RIGHT HUMERUS - 2+ VIEW COMPARISON:  Concurrent chest radiograph FINDINGS: The osseous structures appear diffusely demineralized which may limit detection of small or nondisplaced fractures. No acute bony abnormality. Specifically, no fracture, subluxation, or dislocation. Slight elevation of the right humeral head relative to the glenoid with some mild undersurface remodeling of the acromion is compatible with chronic rotator cuff insufficiency. Degenerative changes at the glenohumeral joint are mild with more moderate change at the acromioclavicular joint. Acromioclavicular and coracoclavicular intervals are maintained. The humerus is intact. Alignment at the elbow is grossly  preserved though incompletely assessed on this non dedicated radiograph. Benign-appearing soft tissue mineralization seen in the lateral upper arm. IMPRESSION: No acute bony abnormality. Degenerative changes at the glenohumeral and acromioclavicular joints. Findings compatible with chronic rotator cuff insufficiency. Electronically Signed   By: Lovena Le M.D.   On: 08/27/2019 02:26   CT Renal Stone Study  Result Date: 08/13/2019 CLINICAL DATA:  Dysuria. Acute kidney injury. EXAM: CT ABDOMEN AND PELVIS WITHOUT CONTRAST TECHNIQUE: Multidetector CT imaging of the abdomen and pelvis was performed following the  standard protocol without IV contrast. COMPARISON:  CT 05/19/2019 FINDINGS: Lower chest: Linear atelectasis in the right lower lobe. No pleural fluid. Hepatobiliary: Post cholecystectomy. No evidence of focal lesion. No biliary dilatation. Pancreas: No ductal dilatation or inflammation. Spleen: Normal in size without focal abnormality. Adrenals/Urinary Tract: No adrenal nodule. No renal stones or hydronephrosis. No perinephric edema. No evidence of ureteral calculi. Urinary bladder is physiologically distended. No bladder wall thickening. No bladder stone. Stomach/Bowel: Densely calcified lesion abutting the lesser curvature of the stomach, unchanged from prior exams. Stomach is distended with ingested material. No small bowel obstruction or inflammation. Appendix not confidently visualized. Small volume of stool in the colon. Colonic interposition under the right hemidiaphragm. No colonic wall thickening or inflammatory change. Vascular/Lymphatic: Moderate aorto bi-iliac atherosclerosis. No aortic aneurysm. No bulky abdominopelvic adenopathy. Reproductive: Post hysterectomy. No adnexal mass. Other: No free air, free fluid, or intra-abdominal fluid collection. Musculoskeletal: Chronic compression deformities in the lower thoracic and lumbar spine, unchanged from prior exam. Vertebral augmentation of T11 and T12. No acute osseous abnormality. IMPRESSION: 1. No renal stones, obstructive uropathy, or explanation for dysuria and acute kidney injury. 2. No acute findings in the abdomen/pelvis. 3. Chronic calcified lesion arising from the lesser curvature of the stomach, stable from prior exams and presumably benign. Electronically Signed   By: Keith Rake M.D.   On: 08/13/2019 23:14    Labs: BNP (last 3 results) No results for input(s): BNP in the last 8760 hours. Basic Metabolic Panel: Recent Labs  Lab 08/27/19 0240 08/28/19 0349 08/29/19 0311 08/30/19 0422 09/02/19 0255  NA 142 141 142 141 140  K  3.5 3.5 2.9* 3.8 3.3*  CL 107 111 109 108 109  CO2 21* 20* 20* 21* 23  GLUCOSE 105* 130* 90 101* 98  BUN 12 5* <5* <5* 7*  CREATININE 0.95 0.72 0.57 0.57 0.72  CALCIUM 9.1 8.2* 8.3* 9.0 8.7*  MG 1.5*  --  1.4* 1.7  --    Liver Function Tests: Recent Labs  Lab 08/27/19 0240  AST 43*  ALT 22  ALKPHOS 46  BILITOT 0.7  PROT 6.5  ALBUMIN 3.3*   No results for input(s): LIPASE, AMYLASE in the last 168 hours. No results for input(s): AMMONIA in the last 168 hours. CBC: Recent Labs  Lab 08/27/19 0240 08/28/19 0349 08/29/19 0311 08/30/19 0422  WBC 9.4 5.0 6.8 8.4  NEUTROABS 6.8  --   --   --   HGB 12.0 9.7* 9.6* 10.5*  HCT 40.8 31.3* 30.6* 33.2*  MCV 96.0 91.5 90.5 90.0  PLT 328 263 255 284   Cardiac Enzymes: No results for input(s): CKTOTAL, CKMB, CKMBINDEX, TROPONINI in the last 168 hours. BNP: Invalid input(s): POCBNP CBG: Recent Labs  Lab 08/28/19 1606  GLUCAP 128*   D-Dimer No results for input(s): DDIMER in the last 72 hours. Hgb A1c No results for input(s): HGBA1C in the last 72 hours. Lipid Profile No results for input(s): CHOL, HDL,  LDLCALC, TRIG, CHOLHDL, LDLDIRECT in the last 72 hours. Thyroid function studies No results for input(s): TSH, T4TOTAL, T3FREE, THYROIDAB in the last 72 hours.  Invalid input(s): FREET3 Anemia work up No results for input(s): VITAMINB12, FOLATE, FERRITIN, TIBC, IRON, RETICCTPCT in the last 72 hours. Urinalysis    Component Value Date/Time   COLORURINE YELLOW 08/27/2019 0213   APPEARANCEUR CLEAR 08/27/2019 0213   LABSPEC 1.017 08/27/2019 0213   PHURINE 5.0 08/27/2019 0213   GLUCOSEU NEGATIVE 08/27/2019 0213   HGBUR NEGATIVE 08/27/2019 [place] 08/27/2019 0213   BILIRUBINUR negative 09/10/2018 1523   KETONESUR 5 (A) 08/27/2019 0213   PROTEINUR 100 (A) 08/27/2019 0213   UROBILINOGEN 2.0 (H) 08/13/2019 1542   NITRITE NEGATIVE 08/27/2019 0213   LEUKOCYTESUR SMALL (A) 08/27/2019 0213   Sepsis  Labs Invalid input(s): PROCALCITONIN,  WBC,  LACTICIDVEN Microbiology Recent Results (from the past 240 hour(s))  SARS CORONAVIRUS 2 (TAT 6-24 HRS) Nasopharyngeal Nasopharyngeal Swab     Status: None   Collection Time: 08/27/19  3:24 AM   Specimen: Nasopharyngeal Swab  Result Value Ref Range Status   SARS Coronavirus 2 NEGATIVE NEGATIVE Final    Comment: (NOTE) SARS-CoV-2 target nucleic acids are NOT DETECTED. The SARS-CoV-2 RNA is generally detectable in upper and lower respiratory specimens during the acute phase of infection. Negative results do not preclude SARS-CoV-2 infection, do not rule out co-infections with other pathogens, and should not be used as the sole basis for treatment or other patient management decisions. Negative results must be combined with clinical observations, patient history, and epidemiological information. The expected result is Negative. Fact Sheet for Patients: SugarRoll.be Fact Sheet for Healthcare Providers: https://www.woods-mathews.com/ This test is not yet approved or cleared by the [place] FDA and  has been authorized for detection and/or diagnosis of SARS-CoV-2 by FDA under an Emergency Use Authorization (EUA). This EUA will remain  in effect (meaning this test can be used) for the duration of the COVID-19 declaration under Section 56 4(b)(1) of the Act, 21 U.S.C. section 360bbb-3(b)(1), unless the authorization is terminated or revoked sooner. Performed at [facility], [place] [address], [place], [place] [address]      Time coordinating discharge: 25  minutes  SIGNED: Antonieta Pert, MD  [facility] 09/02/2019, 10:25 AM  If 7PM-7AM, please contact night-coverage [email or web address]

## 2019-09-02 NOTE — Progress Notes (Signed)
MD notified as pt developed chest pain while entering the room to go over d/c instructions. Pt described it to be sharp 4/10, and felt SOB. EKG and labs obtained per order. Meds given (see MAR). Approx 20 mins later pt stated pain had completely resolved however still felt slightly SOB. VSS, 02 100% RA. Will continue to monitor.

## 2019-09-02 NOTE — Progress Notes (Signed)
Physical Therapy Treatment Patient Details Name: [name] MRN: [id number] DOB: January 21, 1936 Today's Date: 09/02/2019    History of Present Illness 84 yo female admitted to ED with syncope with x3 falls at home, dehydration, generalized weakness. CT head and neck negative for acute abnormalities. PMH includes CHF, COPD, UTIs, anxiety, falls, DMII, L5-S1 lumbar discectomy, RA, migraines.    PT Comments    Patient received in bed, required some coaxing to participate in PT, RN assisted in encouraging patient for OOB activity. See below for mobility levels. Able to slightly progress gait distance and she was able to gait train with less physical assist, but continues to need heavy verbal and visual cuing for navigation in room and safety. Does have increased processing time and takes a bit to respond to verbal and visual cues. VSS on RA. She was left in bed with all needs met, bed alarm active. Per RN, likely going home today.     Follow Up Recommendations  SNF;Supervision/Assistance - 24 hour(refusing SNF)     Equipment Recommendations  None recommended by PT    Recommendations for Other Services       Precautions / Restrictions Precautions Precautions: Fall Precaution Comments: + orthostatic in ED Restrictions Weight Bearing Restrictions: No    Mobility  Bed Mobility Overal bed mobility: Needs Assistance Bed Mobility: Supine to Sit;Sit to Supine     Supine to sit: Min guard;HOB elevated Sit to supine: Min guard;HOB elevated   General bed mobility comments: increased time/effort, assist for line management but no physical assist given  Transfers Overall transfer level: Needs assistance Equipment used: Rolling walker (2 wheeled) Transfers: Sit to/from Stand Sit to Stand: Min assist         General transfer comment: cues for hand placement and sequencing- hand over hand assist provided for correct hand placement  Ambulation/Gait Ambulation/Gait assistance: Min  guard Gait Distance (Feet): 14 Feet Assistive device: Rolling walker (2 wheeled) Gait Pattern/deviations: Step-through pattern;Decreased step length - right;Decreased step length - left;Decreased stride length;Trunk flexed Gait velocity: decreased   General Gait Details: slight increase in gait distance today, needed less physical assist to navigate in room but still needed heavy verbal and visual cues   Stairs             Wheelchair Mobility    Modified Rankin (Stroke Patients Only)       Balance Overall balance assessment: Needs assistance;History of Falls Sitting-balance support: Feet supported Sitting balance-Leahy Scale: Good Sitting balance - Comments: S for safety   Standing balance support: Bilateral upper extremity supported;During functional activity Standing balance-Leahy Scale: Poor Standing balance comment: reliant on external support                            Cognition Arousal/Alertness: Awake/alert Behavior During Therapy: Flat affect Overall Cognitive Status: Impaired/Different from baseline Area of Impairment: Attention;Following commands;Problem solving;Orientation                 Orientation Level: Disoriented to;Time;Place;Situation Current Attention Level: Sustained Memory: Decreased short-term memory Following Commands: Follows one step commands consistently;Follows one step commands with increased time Safety/Judgement: Decreased awareness of safety;Decreased awareness of deficits   Problem Solving: Slow processing;Decreased initiation;Requires verbal cues General Comments: increased time needed for procesing and VC for safety awareness. Limited carryover from last session but follows cues well      Exercises      General Comments General comments (skin integrity, edema, etc.): VSS on  RA      Pertinent Vitals/Pain Pain Assessment: Faces Faces Pain Scale: Hurts a little bit Pain Location: R shoulder Pain  Descriptors / Indicators: Discomfort;Guarding Pain Intervention(s): Limited activity within patient's tolerance;Monitored during session;Patient requesting pain meds-RN notified    Home Living                      Prior Function            PT Goals (current goals can now be found in the care plan section) Acute Rehab PT Goals Patient Stated Goal: to get stronger, go home PT Goal Formulation: With patient Time For Goal Achievement: 09/10/19 Potential to Achieve Goals: Fair Progress towards PT goals: Progressing toward goals    Frequency    Min 3X/week      PT Plan Current plan remains appropriate    Co-evaluation              AM-PAC PT "6 Clicks" Mobility   Outcome Measure  Help needed turning from your back to your side while in a flat bed without using bedrails?: A Little Help needed moving from lying on your back to sitting on the side of a flat bed without using bedrails?: A Little Help needed moving to and from a bed to a chair (including a wheelchair)?: A Little Help needed standing up from a chair using your arms (e.g., wheelchair or bedside chair)?: A Little Help needed to walk in hospital room?: A Little Help needed climbing 3-5 steps with a railing? : A Lot 6 Click Score: 17    End of Session Equipment Utilized During Treatment: Gait belt Activity Tolerance: Patient limited by fatigue Patient left: in bed;with call bell/phone within reach;with bed alarm set Nurse Communication: Mobility status;Patient requests pain meds(voltaren gel for R shoulder) PT Visit Diagnosis: Muscle weakness (generalized) (M62.81);Difficulty in walking, not elsewhere classified (R26.2);History of falling (Z91.81)     Time: 6734-1937 PT Time Calculation (min) (ACUTE ONLY): 24 min  Charges:  $Gait Training: 8-22 mins $Therapeutic Activity: 8-22 mins                     Windell Norfolk, DPT, PN1   Supplemental Physical Therapist [place]    Pager  [phone] Acute Rehab Office [phone]

## 2019-09-02 NOTE — Discharge Instructions (Signed)
Tips For Adding Protein Nutrition Therapy   Tips . Add extra egg to one or more meals  . Increase the portion of milk to drink and change to skim milk if able  . Include [place] yogurt or cottage cheese for snack or part of a meal  . Increase portion size of protein entre and decrease portion of starch/bread  . Mix protein powder, nut butter, almond/nut milk, non-fat dry milk, or Greek yogurt to shakes and smoothies  . Use these ingredients also in baked goods or other recipes . Use double the amount of sandwich filling  . Add protein foods to all snacks including cheese, nut butters, milk and yogurt Food Tips for Including Protein  Beans . Cook and use dried peas, beans, and tofu in soups or add to casseroles, pastas, and grain dishes that also contain cheese or meat  . Mash with cheese and milk  . Use tofu to make smoothies  Commercial Protein Supplements . Use nutritional supplements or protein powder sold at pharmacies and grocery stores  . Use protein powder in milk drinks and desserts, such as pudding  . Mix with ice cream, milk, and fruit or other flavorings for a high-protein milkshake  Cottage Cheese or [employer] . Mix with or use to stuff fruits and vegetables  . Add to casseroles, spaghetti, noodles, or egg dishes such as omelets, scrambled eggs, and souffls  . Use gelatin, pudding-type desserts, cheesecake, and pancake or waffle batter  . Use to stuff crepes, pasta shells, or manicotti  . Puree and use as a substitute for sour cream  Eggs, Egg whites, and Egg Yolks . Add chopped, hard-cooked eggs to salads and dressings, vegetables, casseroles, and creamed meats  . Beat eggs into mashed potatoes, vegetable purees, and sauces  . Add extra egg whites to quiches, scrambled eggs, custards, puddings, pancake batter, or [place]  . Make a rich custard with egg yolks, double strength milk, and sugar  . Add extra hard-cooked yolks to deviled egg filling and  sandwich spreads  Hard or Semi-Soft Cheese (Cheddar, Sherley Bounds) . Melt on sandwiches, bread, muffins, tortillas, hamburgers, hot dogs, other meats or fish, vegetables, eggs, or desserts such as stewed figs or pies  . Grate and add to soups, sauces, casseroles, vegetable dishes, potatoes, rice noodles, or meatloaf  . Serve as a snack with crackers or bagels  Ice cream, Yogurt, and Frozen Yogurt . Add to milk drinks such as milkshakes  . Add to cereals, fruits, gelatin desserts, and pies  . Blend or whip with soft or cooked fruits  . Sandwich ice cream or frozen yogurt between enriched cake slices, cookies, or graham crackers  . Use seasoned yogurt as a dip for fruits, vegetables, or chips  . Use yogurt in place of sour cream in casseroles  Meat and Fish . Add chopped, cooked meat or fish to vegetables, salads, casseroles, soups, sauces, and biscuit dough  . Use in omelets, souffls, quiches, and sandwich fillings  . Add chicken and [place] to stuffing  . Wrap in pie crust or biscuit dough as turnovers  . Add to stuffed baked potatoes  . Add pureed meat to soups  Milk . Use in beverages and in cooking  . Use in preparing foods, such as hot cereal, soups, cocoa, or pudding  . Add cream sauces to vegetable and other dishes  . Use evaporated milk, evaporated skim milk, or sweetened condensed milk instead of milk or water in recipes.  Nonfat Dry Milk . Add 1/3 cup of nonfat dry milk powdered milk to each cup of regular milk for "double strength" milk  . Add to yogurt and milk drinks, such as pasteurized eggnog and milkshakes  . Add to scrambled eggs and mashed potatoes  . Use in casseroles, meatloaf, hot cereal, breads, muffins, sauces, cream soups, puddings and custards, and other milk-based desserts  Nuts, Seeds, and Wheat Germ . Add to casseroles, breads, muffins, pancakes, cookies, and waffles  . Sprinkle on fruit, cereal, ice cream, yogurt, vegetables, salads, and toast as a crunchy topping    . Use in place of breadcrumbs  . Blend with parsley or spinach, herbs, and cream for a noodle, pasta, or vegetable sauce.  . Roll banana in chopped nuts  Peanut Butter . Spread on sandwiches, toast, muffins, crackers, waffles, pancakes, and fruit slices  . Use as a dip for raw vegetables, such as carrots, cauliflower, and celery  . Blend with milk drinks, smoothies, and other beverages  . Swirl through soft ice cream or yogurt  . Spread on a banana then roll in crushed, dry cereal or chopped nuts   Copyright 2020  Academy of [employer]. All rights reserved

## 2019-09-02 NOTE — Progress Notes (Signed)
PROGRESS NOTE    [name]  [id number] DOB: 1936-05-20 DOA: 08/27/2019 PCP: [name], PA-C   Brief Narrative:  51yofwith HTN, CHF, diabetes mellitus type 2, COPD, frequent UTI,history of RA on chronic prednisone, history of tachycardia, history of gastroparesis recently seen by Dr. Havery Moros and had endoscopy-no gross lesionscame tothe ER for evaluation of syncopal episode. She had 5hospitalizations in the last 3 months. Patient apparently passed out on the way to her bathroom. She felt lightheaded and passed out, was not sure if she injured her head was having some neck pain and headache. She complained of mild dysuria. In the ER CT head and neck no acute finding, COVID-19 negative, UA questionable UTI.Patient is admitted for further management. Patient has a hydrate with IV fluids her medications were adjusted discontinued HCTZ lisinopril and placed on Coreg.  Patient was seen by PT OT skilled nursing facility was advised. Patient is adamantly refusing placement and wants to go home.  Discussed this with patient's niece and she understands that patient has made decision not to go to facility and she wants to return home.  They have been trying for her to go to facility.   Subjective C/O chest pain this afternoon, sternal area, got better with maalox, ekg/trop was done refusing to go to SNF again  Assesment/Plan   Syncopal episode suspecting combination of vasovagal episode and related dehydration. CT head and neck no acute finding. Recent echocardiogram In 2020 lvef was 65%. On last discharge HCTZ was discontinued but it seems patient was taking it. HCTZ and lisinopril discontinued-set up home health RN to monitor her medication.cont po hydration  Chest pain- EKG obtaind NSR, n oacute ischemic changes, maalox x1,. Trop was obtained- 1st one slightly up at 30s- repeat. On reeval no more chest pain after maalox. If trop significantly up will  consult cardio. No known hx of CAD.  Nausea-resolved, ate well. Her po reglan was changed to iv while here. Cont home Reglan,Nausea meds and ppi CT-scan of the abdomen pelvis- no acute findings 2/3 Diabetic gastroparesis:cont plan as above. Recently seen by Dr. Havery Moros and had endoscopy-no gross lesions.cont to augment nutrition  Hypokalemia replaced w/ oral and IV potassium chloride.  Dehydration: cont po  Diabetes mellitus type 2/with history of gastroparesis: HB1C fairly stable 7.9 on October 2020, blood sugar controlled, continue home meds on d.c. cont ssi here.  Rheumatoid arthritison chronic steroid:Continue home dose, continue home Norco. Continue PPIwhile on chronic steroid.  HLDcontinue statin.  COPDnot in exacerbation continue inhaler.  Chronic diastolic heart failure, NYHA class 1:Compensated. Monitor.   Essential hypertension:BP controlled.Continue Coreg- new- off  HCTZ/LISINOPRIL due to dehydration risk  Nutrition: Nutrition Problem: Unintentional weight loss Etiology: acute illness Signs/Symptoms: percent weight loss Percent weight loss: 14 %   Body mass index is 18.27 kg/m.   DVT prophylaxis:Lovenox Code Status: DNR  Family Communication: plan of care discussed with patient at bedside. I had called and spoken to her Niece- who said she will try to convince her to go to SNF but highly unlikley she will agree for SNF. Home tomorrow if stable Disposition Plan: Patient is from:home. Anticipated d/c NE:[id number] w [facility] tomorrow. Monitor overnight givne her chest pain. High risk for readmission has had multiple recent admissions.  Consultants:None  Procedures:see note Microbiology:see note Antimicrobials: Anti-infectives (From admission, onward)   Start     Dose/Rate Route Frequency Ordered Stop   08/27/19 0845  cefTRIAXone (ROCEPHIN) 1 g in sodium chloride 0.9 % 100 mL IVPB  Status:  Discontinued     1 g 200 mL/hr over 30 Minutes Intravenous  Every 24 hours 08/27/19 0835 08/27/19 0850      Medications: Scheduled Meds: . aspirin EC  81 mg Oral Q M,W,F  . atorvastatin  10 mg Oral q1800  . carvedilol  6.25 mg Oral BID WC  . diclofenac Sodium  2 g Topical QID  . docusate sodium  100 mg Oral BID  . enoxaparin (LOVENOX) injection  40 mg Subcutaneous Daily  . feeding supplement  1 Container Oral TID BM  . magnesium chloride  1 tablet Oral BID  . methylPREDNISolone  4 mg Oral Q breakfast  . metoCLOPramide (REGLAN) injection  5 mg Intravenous Q8H  . mometasone-formoterol  2 puff Inhalation BID  . multivitamin with minerals  1 tablet Oral Daily  . pantoprazole  40 mg Oral Daily  . polyethylene glycol  17 g Oral Daily  . sodium phosphate  1 enema Rectal Once   Continuous Infusions:  Objective: Vitals:   09/02/19 0752 09/02/19 0857 09/02/19 0900 09/02/19 1319  BP:  135/68 135/68 (!) 148/72  Pulse: 83 81  88  Resp: 16  (!) 23 18  Temp:    97.8 F (36.6 C)  TempSrc:    Oral  SpO2: 92%   95%  Weight:      Height:        Intake/Output Summary (Last 24 hours) at 09/02/2019 1558 Last data filed at 09/02/2019 1450 Gross per 24 hour  Intake 594.22 ml  Output 1355 ml  Net -760.78 ml   Filed Weights   08/31/19 0301 09/01/19 0259 09/02/19 0530  Weight: 50 kg 50 kg 49.8 kg   Weight change: -0.227 kg  Body mass index is 18.27 kg/m.  Intake/Output from previous day: 02/03 0701 - 02/04 0700 In: 594.2 [I.V.:594.2] Out: 475 [Urine:475] Intake/Output this shift: Total I/O In: -  Out: 880 [Urine:880]  Examination: General exam: AAOx3, elderly, frail,NAD, weak/frail HEENT:Oral mucosa moist, Ear/Nose WNL grossly, dentition normal. Respiratory system: b/l clear,no wheezing or crackles, NT,no use of accessory muscle Cardiovascular system: S1 & S2 +, No JVD, regular RR. Gastrointestinal system: Abdomen soft, NT,ND, BS+ Nervous System:Alert, awake, moving extremities and grossly nonfocal Extremities: No edema, distal  peripheral pulses palpable.  Skin: No rashes,no icterus. MSK: Normal muscle bulk,tone, power  Data Reviewed: I have personally reviewed following labs and imaging studies  CBC: Recent Labs  Lab 08/27/19 0240 08/28/19 0349 08/29/19 0311 08/30/19 0422  WBC 9.4 5.0 6.8 8.4  NEUTROABS 6.8  --   --   --   HGB 12.0 9.7* 9.6* 10.5*  HCT 40.8 31.3* 30.6* 33.2*  MCV 96.0 91.5 90.5 90.0  PLT 328 263 255 [id number]   Basic Metabolic Panel: Recent Labs  Lab 08/27/19 0240 08/28/19 0349 08/29/19 0311 08/30/19 0422 09/02/19 0255  NA 142 141 142 141 140  K 3.5 3.5 2.9* 3.8 3.3*  CL 107 111 109 108 109  CO2 21* 20* 20* 21* 23  GLUCOSE 105* 130* 90 101* 98  BUN 12 5* <5* <5* 7*  CREATININE 0.95 0.72 0.57 0.57 0.72  CALCIUM 9.1 8.2* 8.3* 9.0 8.7*  MG 1.5*  --  1.4* 1.7  --    GFR: Estimated Creatinine Clearance: 43.4 mL/min (by C-G formula based on SCr of 0.72 mg/dL). Liver Function Tests: Recent Labs  Lab 08/27/19 0240  AST 43*  ALT 22  ALKPHOS 46  BILITOT 0.7  PROT 6.5  ALBUMIN 3.3*   No results  for input(s): LIPASE, AMYLASE in the last 168 hours. No results for input(s): AMMONIA in the last 168 hours. Coagulation Profile: No results for input(s): INR, PROTIME in the last 168 hours. Cardiac Enzymes: No results for input(s): CKTOTAL, CKMB, CKMBINDEX, TROPONINI in the last 168 hours. BNP (last 3 results) No results for input(s): PROBNP in the last 8760 hours. HbA1C: No results for input(s): HGBA1C in the last 72 hours. CBG: Recent Labs  Lab 08/28/19 1606  GLUCAP 128*   Lipid Profile: No results for input(s): CHOL, HDL, LDLCALC, TRIG, CHOLHDL, LDLDIRECT in the last 72 hours. Thyroid Function Tests: No results for input(s): TSH, T4TOTAL, FREET4, T3FREE, THYROIDAB in the last 72 hours. Anemia Panel: No results for input(s): VITAMINB12, FOLATE, FERRITIN, TIBC, IRON, RETICCTPCT in the last 72 hours. Sepsis Labs: No results for input(s): PROCALCITON, LATICACIDVEN in the last  168 hours.  Recent Results (from the past 240 hour(s))  SARS CORONAVIRUS 2 (TAT 6-24 HRS) Nasopharyngeal Nasopharyngeal Swab     Status: None   Collection Time: 08/27/19  3:24 AM   Specimen: Nasopharyngeal Swab  Result Value Ref Range Status   SARS Coronavirus 2 NEGATIVE NEGATIVE Final    Comment: (NOTE) SARS-CoV-2 target nucleic acids are NOT DETECTED. The SARS-CoV-2 RNA is generally detectable in upper and lower respiratory specimens during the acute phase of infection. Negative results do not preclude SARS-CoV-2 infection, do not rule out co-infections with other pathogens, and should not be used as the sole basis for treatment or other patient management decisions. Negative results must be combined with clinical observations, patient history, and epidemiological information. The expected result is Negative. Fact Sheet for Patients: SugarRoll.be Fact Sheet for Healthcare Providers: https://www.woods-mathews.com/ This test is not yet approved or cleared by the [place] FDA and  has been authorized for detection and/or diagnosis of SARS-CoV-2 by FDA under an Emergency Use Authorization (EUA). This EUA will remain  in effect (meaning this test can be used) for the duration of the COVID-19 declaration under Section 56 4(b)(1) of the Act, 21 U.S.C. section 360bbb-3(b)(1), unless the authorization is terminated or revoked sooner. Performed at [facility], [place] [address], [place], [place] [address]       Radiology Studies: CT ABDOMEN PELVIS WO CONTRAST  Result Date: 09/01/2019 CLINICAL DATA:  Lower abdominal pain. Acute kidney injury. EXAM: CT ABDOMEN AND PELVIS WITHOUT CONTRAST TECHNIQUE: Multidetector CT imaging of the abdomen and pelvis was performed following the standard protocol without IV contrast. COMPARISON:  08/13/2019 FINDINGS: Lower chest: No acute findings. Hepatobiliary: No mass visualized on this unenhanced exam.  Prior cholecystectomy. No evidence of biliary obstruction. Pancreas: No mass or inflammatory process visualized on this unenhanced exam. Spleen:  Within normal limits in size. Adrenals/Urinary tract: No evidence of urolithiasis or hydronephrosis. Unremarkable unopacified urinary bladder. Stomach/Bowel: No evidence of obstruction, inflammatory process, or abnormal fluid collections. Vascular/Lymphatic: No pathologically enlarged lymph nodes identified. No evidence of abdominal aortic aneurysm. Aortic atherosclerosis incidentally noted. Reproductive: Prior hysterectomy noted. Adnexal regions are unremarkable in appearance. Other: Benign-appearing calcification in the anterior left upper quadrant remains stable. Musculoskeletal: No suspicious bone lesions identified. Stable old compression fracture deformity of the L4 vertebral body and previous vertebroplasties at T11 and T12. IMPRESSION: No evidence of urolithiasis, hydronephrosis, or other acute findings. Electronically Signed   By: Marlaine Hind M.D.   On: 09/01/2019 17:05     LOS: 5 days   Time spent: More than 50% of that time was spent in counseling and/or coordination of  care.  Antonieta Pert, MD [facility]  09/02/2019, 3:58 PM

## 2019-09-02 NOTE — TOC Transition Note (Signed)
Transition of Care (TOC) - CM/SW Discharge Note Marvetta Gibbons RN, BSN Transitions of Care Unit 4E- RN Case Manager (6E cross coverage) [phone]   Patient Details  Name: [name] MRN: [id number] Date of Birth: July 30, 1935  Transition of Care [facility]) CM/SW Contact:  Dawayne Patricia, RN Phone Number: 09/02/2019, 11:31 AM   Clinical Narrative:    Pt stable for transition home, has refused SNF and wants to return home with [facility]- as previously noted referral has been made to [facility] needs per pt choice- and referral has been accepted- spoke with Mateo Flow at [facility] to confirm. [facility] will f/u for [facility] home visit- they have been notified of pt's return home today. HH orders for HHRN/PT/OT/aide/SW   Final next level of care: Home w Home Health Services Barriers to Discharge: No Barriers Identified, Barriers Resolved   Patient Goals and CMS Choice Patient states their goals for this hospitalization and ongoing recovery are:: To get stronger   Choice offered to / list presented to : Patient  Discharge Placement               Home with [facility]        Discharge Plan and Services                DME Arranged: N/A DME Agency: NA       HH Arranged: PT, OT, RN, Nurse's Aide, Social Work [employer] Agency: [place] ([place]) Date [place]: 09/02/19 Time [place]: 37 Representative spoke with at [place]: [place] ([place]) Interventions     Readmission Risk Interventions Readmission Risk Prevention Plan 09/02/2019  Transportation Screening Complete  Medication Review Press photographer) Complete  PCP or Specialist appointment within 3-5 days of discharge Complete  HRI or [place] Complete  SW Recovery Care/Counseling Consult Complete  [place] Patient Refused  Some recent data might be hidden

## 2019-09-03 ENCOUNTER — Inpatient Hospital Stay (HOSPITAL_COMMUNITY): Payer: Medicare Other

## 2019-09-03 DIAGNOSIS — R55 Syncope and collapse: Secondary | ICD-10-CM

## 2019-09-03 LAB — BASIC METABOLIC PANEL
Anion gap: 10 (ref 5–15)
BUN: 5 mg/dL — ABNORMAL LOW (ref 8–23)
CO2: 22 mmol/L (ref 22–32)
Calcium: 8.8 mg/dL — ABNORMAL LOW (ref 8.9–10.3)
Chloride: 109 mmol/L (ref 98–111)
Creatinine, Ser: 0.62 mg/dL (ref 0.44–1.00)
GFR calc Af Amer: >60 mL/min (ref >60)
GFR calc non Af Amer: >60 mL/min (ref >60)
Glucose, Bld: 106 mg/dL — ABNORMAL HIGH (ref 70–99)
Potassium: 3.3 mmol/L — ABNORMAL LOW (ref 3.5–5.1)
Sodium: 141 mmol/L (ref 135–145)

## 2019-09-03 LAB — ECHOCARDIOGRAM COMPLETE
Height: 65 in
Weight: 1755.2 oz

## 2019-09-03 MED ORDER — POTASSIUM CHLORIDE CRYS ER 20 MEQ PO TBCR
40.0000 meq | EXTENDED_RELEASE_TABLET | Freq: Once | ORAL | Status: AC
  Administered 2019-09-03: 40 meq via ORAL
  Filled 2019-09-03: qty 2

## 2019-09-03 NOTE — Progress Notes (Signed)
  Echocardiogram 2D Echocardiogram has been performed.  [name] 09/03/2019, 10:05 AM

## 2019-09-03 NOTE — Progress Notes (Signed)
Nutrition Follow-up  DOCUMENTATION CODES:   Underweight  INTERVENTION:   -Boost Breeze po TID, each supplement provides 250 kcal and 9 grams of protein  NUTRITION DIAGNOSIS:   Unintentional weight loss related to acute illness as evidenced by percent weight loss.  Ongoing.  GOAL:   Patient will meet greater than or equal to 90% of their needs  Progressing.  MONITOR:   PO intake, Supplement acceptance, Labs, Weight trends, Skin, I & O's  ASSESSMENT:   84 y.o. female with medical history significant of hypertension, CHF, diabetes mellitus type 2, gastroparesis, COPD, frequent UTIs, came with a chief complaint of syncopal episode.  **RD working remotely**  Per chart review, pt was expected to discharge yesterday 2/4 but pt developed chest pain.  Pt now with discharge order for today. Pt prefers to discharge home vs SNF. Pt was eating much better as of yesterday. Pt continues to drink ~1 Boost Breeze daily.  Admission weight: 113 lbs. Current weight: 109 lbs.  Medications: SLOW-MAG tablet, IV Reglan, Multivitamin with minerals daily, KLOR-CON  Labs reviewed:  Low K  Diet Order:   Diet Order            Diet - low sodium heart healthy        Diet - low sodium heart healthy        DIET SOFT Room service appropriate? Yes; Fluid consistency: Thin  Diet effective now              EDUCATION NEEDS:   Education needs have been addressed  Skin:  Skin Assessment: Reviewed RN Assessment  Last BM:  2/4  Height:   Ht Readings from Last 1 Encounters:  08/30/19 5\' 5"  (1.651 m)    Weight:   Wt Readings from Last 1 Encounters:  09/03/19 49.8 kg   BMI:  Body mass index is 18.26 kg/m.  Estimated Nutritional Needs:   Kcal:  1400-1600kcal/day  Protein:  75-85g/day  Fluid:  >1.3L/day  Mendel Ryder, MS, RD, LDN Inpatient Clinical Dietitian Contact information available via Amion

## 2019-09-03 NOTE — Progress Notes (Signed)
Occupational Therapy Treatment Patient Details Name: [name] MRN: [id number] DOB: 1936-07-01 Today's Date: 09/03/2019    History of present illness 84 yo female admitted to ED with syncope with x3 falls at home, dehydration, generalized weakness. CT head and neck negative for acute abnormalities. PMH includes CHF, COPD, UTIs, anxiety, falls, DMII, L5-S1 lumbar discectomy, RA, migraines.   OT comments  Pt demonstrates poor awareness to deficits and remains high fall risk. Pt could benefit from services at home if declines SNF. Pt high fall risk but reports no concerns and no fear of falling. Pt currently will requires 24/7 (A) for safety and min (A) for basic transfers.   Follow Up Recommendations  SNF;Supervision/Assistance - 24 hour(pt refusing)    Equipment Recommendations  None recommended by OT    Recommendations for Other Services      Precautions / Restrictions Precautions Precautions: Fall       Mobility Bed Mobility Overal bed mobility: Needs Assistance Bed Mobility: Sit to Supine       Sit to supine: Min guard   General bed mobility comments: total (A) to scoot to [facility]  Transfers Overall transfer level: Needs assistance   Transfers: Stand Pivot Transfers Sit to Stand: Min assist              Balance Overall balance assessment: Needs assistance         Standing balance support: Bilateral upper extremity supported;During functional activity Standing balance-Leahy Scale: Poor                             ADL either performed or assessed with clinical judgement   ADL Overall ADL's : Needs assistance/impaired                         Toilet Transfer: Minimal assistance;BSC   Toileting- Clothing Manipulation and Hygiene: Total assistance         General ADL Comments: pt at EOB with alarm sounding on arrival. Pt requires cues for safety. pt requires total (A) to scoot up in bed     Vision       Perception      Praxis      Cognition Arousal/Alertness: Awake/alert Behavior During Therapy: Flat affect Overall Cognitive Status: Impaired/Different from baseline                     Current Attention Level: Sustained   Following Commands: Follows one step commands with increased time Safety/Judgement: Decreased awareness of safety;Decreased awareness of deficits     General Comments: pt self reports no concerns of falling now.         Exercises     Shoulder Instructions       General Comments Pt demonstrates mouth breathing but sats remain above 90    Pertinent Vitals/ Pain       Pain Assessment: No/denies pain  Home Living                                          Prior Functioning/Environment              Frequency  Min 3X/week        Progress Toward Goals  OT Goals(current goals can now be found in the care plan section)  Progress towards OT goals: Progressing toward  goals  Acute Rehab OT Goals Patient Stated Goal: to go home OT Goal Formulation: With patient Time For Goal Achievement: 09/10/19 Potential to Achieve Goals: Good ADL Goals Pt Will Perform Grooming: with min guard assist;standing Pt Will Perform Upper Body Bathing: sitting;with set-up Pt Will Perform Lower Body Bathing: with min assist;sit to/from stand Pt Will Perform Upper Body Dressing: with set-up;with supervision;sitting Pt Will Perform Lower Body Dressing: with min assist;sit to/from stand Pt Will Transfer to Toilet: with min assist;stand pivot transfer;bedside commode  Plan Discharge plan remains appropriate    Co-evaluation                 AM-PAC OT "6 Clicks" Daily Activity     Outcome Measure   Help from another person eating meals?: None Help from another person taking care of personal grooming?: A Little Help from another person toileting, which includes using toliet, bedpan, or urinal?: A Lot Help from another person bathing (including washing,  rinsing, drying)?: A Lot Help from another person to put on and taking off regular upper body clothing?: A Little Help from another person to put on and taking off regular lower body clothing?: A Little 6 Click Score: 17    End of Session    OT Visit Diagnosis: Muscle weakness (generalized) (M62.81);Cognitive communication deficit (R41.841)   Activity Tolerance Patient tolerated treatment well   Patient Left in bed;with call bell/phone within reach;with bed alarm set   Nurse Communication Mobility status;Precautions        Time: 0940-1005 OT Time Calculation (min): 25 min  Charges: OT General Charges $OT Visit: 1 Visit OT Treatments $Self Care/Home Management : 8-22 mins   Brynn, OTR/L  Acute Rehabilitation Services Pager: [phone] Office: [phone] .    Jeri Modena 09/03/2019, 10:59 AM

## 2019-09-03 NOTE — Discharge Summary (Signed)
Physician Discharge Summary  [name] [id number] DOB: November 17, 1935 DOA: 08/27/2019  PCP: [name], PA-C  Admit date: 08/27/2019 Discharge date: 09/03/2019  Admitted From: Home Disposition: Home Recommendations for Outpatient Follow-up:  1. Follow up with PCP in 1-2 weeks 2. Please obtain BMP/CBC in one week  Home Health: Yes  equipment/Devices: None Discharge Condition: Stable  CODE STATUS DNR Diet recommendation: Cardiac Brief/Interim Summary:83yofwith HTN, CHF, diabetes mellitus type 2, COPD, frequent UTI,history of RA on chronic prednisone, history of tachycardia, history of gastroparesis recently seen by Dr. Havery Moros and had endoscopy-no gross lesionscame tothe ER for evaluation of syncopal episode. She had 5hospitalizations in the last 3 months. Patient apparently passed out on the way to her bathroom. She felt lightheaded and passed out, was not sure if she injured her head was having some neck pain and headache. She complained of mild dysuria. In the ER CT head and neck no acute finding, COVID-19 negative, UA questionable UTI.Patient is admitted for further management. Patient has a hydrate with IV fluids her medications were adjusted discontinued HCTZ lisinopril and placed on Coreg. Patient was seen by PT OT skilled nursing facility was advised. Patient is adamantly refusing placement and wants to go home. Discussed this with patient's niece and she understands that patient has made decision not to go to facility and she wants to return home. They have been trying for her to go to facility 09/03/2019 her discharge was held yesterday due to chest pain at the time of discharge.  Currently she is chest pain-free her cardiac enzymes have been negative. She is refusing to go to rehab.  Discharge Diagnoses:  Active Problems:   Dehydration, mild   Rheumatoid arthritis (HCC)   Syncope   Tachycardia with heart rate 100-120 beats per minute   DM  (diabetes mellitus) (HCC)   Chronic diastolic heart failure, NYHA class 1 (HCC)   Chronic diastolic CHF (congestive heart failure) ([place])   Essential hypertension    Syncopal episode suspecting combination of vasovagal episode and related dehydration. CT head and neck no acute finding. Recent echocardiogram In 2020 lvef was 65%. On last discharge HCTZ was discontinued but it seems patient was taking it. HCTZand lisinoprildiscontinued-set up home health RN to monitor her medication.cont po hydration  Chest pain- EKG obtaind NSR, n oacute ischemic changes, maalox x1,. Trop was obtained- 1st one slightly up at 30s- repeat. On reeval no more chest pain after maalox. If trop significantly up will consult cardio. No known hx of CAD.  Nausea-resolved, ate well. Her po reglan was changed to iv while here. Cont homeReglan,Nausea meds and ppiCT-scan of the abdomen pelvis- no acute findings 2/3 Diabetic gastroparesis:cont plan as above.Recently seen by Dr. Havery Moros and had endoscopy-no gross lesions.cont toaugment nutrition  Hypokalemiareplaced w/oral and IV potassium chloride.  Dehydration: cont po  Diabetes mellitus type 2/with history of gastroparesis: HB1C fairly stable 7.9 on October 2020, blood sugar controlled, continuehome meds on d.c. cont ssi here.  Rheumatoid arthritison chronic steroid:Continue home dose, continue home Norco. Continue PPIwhile on chronic steroid.  HLDcontinue statin.  COPDnot in exacerbation continue inhaler.  Chronic diastolic heart failure, NYHA class 1:Compensated. Monitor.   Essential hypertension:BP controlled.Continue Coreg- new- off HCTZ/LISINOPRIL due to dehydration risk  Nutrition: Nutrition Problem: Unintentional weight loss Etiology: acute illness Signs/Symptoms: percent weight loss Percent weight loss: 14 % Body mass index is 18.27 kg/m  Nutrition Problem: Unintentional weight loss Etiology: acute  illness    Signs/Symptoms: percent weight loss Percent weight loss: 14 %  Estimated body mass index is 18.26 kg/m as calculated from the following:   Height as of this encounter: 5\' 5"  (1.651 m).   Weight as of this encounter: 49.8 kg.  Discharge Instructions  Discharge Instructions    Call MD for:  difficulty breathing, headache or visual disturbances   Complete by: As directed    Call MD for:  persistant nausea and vomiting   Complete by: As directed    Call MD for:  temperature >100.4   Complete by: As directed    Diet - low sodium heart healthy   Complete by: As directed    Diet - low sodium heart healthy   Complete by: As directed    Discharge instructions   Complete by: As directed    Please call call MD or return to ER for similar or worsening recurring problem that brought you to hospital or if any fever,nausea/vomiting,abdominal pain, uncontrolled pain, chest pain,  shortness of breath or any other alarming symptoms.  DO NOT TAKE YOUR LISINOPRIL AND HCTZ.  Please follow-up your doctor as instructed in a week time and call the office for appointment.  Please avoid alcohol, smoking, or any other illicit substance and maintain healthy habits including taking your regular medications as prescribed.  You were cared for by a hospitalist during your hospital stay. If you have any questions about your discharge medications or the care you received while you were in the hospital after you are discharged, you can call the unit and ask to speak with the hospitalist on call if the hospitalist that took care of you is not available.  Once you are discharged, your primary care physician will handle any further medical issues. Please note that NO REFILLS for any discharge medications will be authorized once you are discharged, as it is imperative that you return to your primary care physician (or establish a relationship with a primary care physician if you do not have one)  for your aftercare needs so that they can reassess your need for medications and monitor your lab values   Increase activity slowly   Complete by: As directed    Increase activity slowly   Complete by: As directed      Allergies as of 09/03/2019      Reactions   Lactose Intolerance (gi)       Medication List    STOP taking these medications   lisinopril-hydrochlorothiazide 10-12.5 MG tablet Commonly known as: ZESTORETIC     TAKE these medications   albuterol 0.63 MG/3ML nebulizer solution Commonly known as: ACCUNEB Take 2 ampules by nebulization every 6 (six) hours as needed for wheezing.   aspirin EC 81 MG tablet Take 1 tablet (81 mg total) by mouth every Monday, Wednesday, and Friday.   atorvastatin 10 MG tablet Commonly known as: Lipitor Take 1 Tablet by mouth daily What changed:   how much to take  how to take this  when to take this  additional instructions   carvedilol 6.25 MG tablet Commonly known as: COREG Take 1 tablet (6.25 mg total) by mouth 2 (two) times daily with a meal.   dicyclomine 10 MG capsule Commonly known as: BENTYL Take 1 capsule (10 mg total) by mouth every 8 (eight) hours as needed for spasms.   docusate sodium 100 MG capsule Commonly known as: COLACE Take 1 capsule (100 mg total) by mouth 2 (two) times daily.   Dulera 200-5 MCG/ACT Aero Generic drug: mometasone-formoterol Inhale 2 puffs into the lungs 2 (  two) times daily.   Gas-X Extra Strength 125 MG chewable tablet Generic drug: simethicone Chew 125 mg by mouth every 6 (six) hours as needed for flatulence.   hydrOXYzine 10 MG tablet Commonly known as: ATARAX/VISTARIL 1 q 8h prn itching What changed:   how much to take  how to take this  when to take this  reasons to take this  additional instructions   magnesium chloride 64 MG Tbec SR tablet Commonly known as: SLOW-MAG Take 1 tablet (64 mg total) by mouth 2 (two) times daily.   methylPREDNISolone 4 MG  tablet Commonly known as: MEDROL Take 4 mg by mouth daily.   metoCLOPramide 5 MG tablet Commonly known as: REGLAN Take 1 tablet (5 mg total) by mouth 3 (three) times daily before meals.   ondansetron 4 MG disintegrating tablet Commonly known as: Zofran ODT Take 1 tablet (4 mg total) by mouth every 8 (eight) hours as needed for up to 30 doses for nausea or vomiting.   OneTouch Verio test strip Generic drug: glucose blood Use to check blood sugar 3 to 4 times a day before meals   pantoprazole 40 MG tablet Commonly known as: PROTONIX Take 1 tablet (40 mg total) by mouth daily for 30 doses.   polyethylene glycol 17 g packet Commonly known as: MiraLax Take 17 g by mouth daily.   Potassium Chloride 40 MEQ/15ML (20%) Soln Take 20 mEq by mouth daily for 10 days.   traMADol 50 MG tablet Commonly known as: ULTRAM Take 1 tablet (50 mg total) by mouth every 6 (six) hours as needed.      Follow-up Information    Rodriguez-Southworth, Sunday Spillers, PA-C Follow up in 1 week(s).   Specialties: Internal Medicine, Emergency Medicine Contact information: [address] [place] [address] [phone]          Allergies  Allergen Reactions  . Lactose Intolerance (Gi)     Consultations:none  Procedures/Studies: CT ABDOMEN PELVIS WO CONTRAST  Result Date: 09/01/2019 CLINICAL DATA:  Lower abdominal pain. Acute kidney injury. EXAM: CT ABDOMEN AND PELVIS WITHOUT CONTRAST TECHNIQUE: Multidetector CT imaging of the abdomen and pelvis was performed following the standard protocol without IV contrast. COMPARISON:  08/13/2019 FINDINGS: Lower chest: No acute findings. Hepatobiliary: No mass visualized on this unenhanced exam. Prior cholecystectomy. No evidence of biliary obstruction. Pancreas: No mass or inflammatory process visualized on this unenhanced exam. Spleen:  Within normal limits in size. Adrenals/Urinary tract: No evidence of urolithiasis or hydronephrosis. Unremarkable  unopacified urinary bladder. Stomach/Bowel: No evidence of obstruction, inflammatory process, or abnormal fluid collections. Vascular/Lymphatic: No pathologically enlarged lymph nodes identified. No evidence of abdominal aortic aneurysm. Aortic atherosclerosis incidentally noted. Reproductive: Prior hysterectomy noted. Adnexal regions are unremarkable in appearance. Other: Benign-appearing calcification in the anterior left upper quadrant remains stable. Musculoskeletal: No suspicious bone lesions identified. Stable old compression fracture deformity of the L4 vertebral body and previous vertebroplasties at T11 and T12. IMPRESSION: No evidence of urolithiasis, hydronephrosis, or other acute findings. Electronically Signed   By: Marlaine Hind M.D.   On: 09/01/2019 17:05   CT HEAD WO CONTRAST  Result Date: 08/27/2019 CLINICAL DATA:  Syncope EXAM: CT HEAD WITHOUT CONTRAST TECHNIQUE: Contiguous axial images were obtained from the base of the skull through the vertex without intravenous contrast. COMPARISON:  05/19/2019 FINDINGS: Brain: There is atrophy and chronic small vessel disease changes. No acute intracranial abnormality. Specifically, no hemorrhage, hydrocephalus, mass lesion, acute infarction, or significant intracranial injury. Vascular: No hyperdense vessel or  unexpected calcification. Skull: No acute calvarial abnormality. Sinuses/Orbits: Visualized paranasal sinuses and mastoids clear. Orbital soft tissues unremarkable. Other: None IMPRESSION: Atrophy, chronic microvascular disease. No acute intracranial abnormality. Electronically Signed   By: Rolm Baptise M.D.   On: 08/27/2019 02:10   CT CERVICAL SPINE WO CONTRAST  Result Date: 08/27/2019 CLINICAL DATA:  Syncope EXAM: CT CERVICAL SPINE WITHOUT CONTRAST TECHNIQUE: Multidetector CT imaging of the cervical spine was performed without intravenous contrast. Multiplanar CT image reconstructions were also generated. COMPARISON:  MRI 06/15/2015 FINDINGS:  Alignment: No subluxation Skull base and vertebrae: No acute fracture. No primary bone lesion or focal pathologic process. Soft tissues and spinal canal: No prevertebral fluid or swelling. No visible canal hematoma. Disc levels:  Diffuse degenerative disc and facet disease. Upper chest: No acute findings Other: Carotid artery calcifications. IMPRESSION: Cervical spondylosis.  No acute bony abnormality. Electronically Signed   By: Rolm Baptise M.D.   On: 08/27/2019 02:12   DG Chest Port 1 View  Result Date: 08/27/2019 CLINICAL DATA:  Syncope, weakness for 1 week, denies head strike EXAM: PORTABLE CHEST 1 VIEW COMPARISON:  Radiograph 07/12/2019 FINDINGS: Bandlike area of scarring or atelectasis in the right mid lung with chronic elevation of the right hemidiaphragm. No consolidation, features of edema, pneumothorax, or effusion. The cardiomediastinal contours are unremarkable. No acute osseous or soft tissue abnormality. Degenerative changes are present in the imaged spine and shoulders. Elevation of the right humerus relative to the glenoid with undersurface remodeling of the acromion likely reflect some rotator cuff insufficiency. Telemetry leads overlie the chest. IMPRESSION: No acute cardiopulmonary abnormality. Electronically Signed   By: Lovena Le M.D.   On: 08/27/2019 02:23   DG Humerus Right  Result Date: 08/27/2019 CLINICAL DATA:  Pain, generalized weakness EXAM: RIGHT HUMERUS - 2+ VIEW COMPARISON:  Concurrent chest radiograph FINDINGS: The osseous structures appear diffusely demineralized which may limit detection of small or nondisplaced fractures. No acute bony abnormality. Specifically, no fracture, subluxation, or dislocation. Slight elevation of the right humeral head relative to the glenoid with some mild undersurface remodeling of the acromion is compatible with chronic rotator cuff insufficiency. Degenerative changes at the glenohumeral joint are mild with more moderate change at the  acromioclavicular joint. Acromioclavicular and coracoclavicular intervals are maintained. The humerus is intact. Alignment at the elbow is grossly preserved though incompletely assessed on this non dedicated radiograph. Benign-appearing soft tissue mineralization seen in the lateral upper arm. IMPRESSION: No acute bony abnormality. Degenerative changes at the glenohumeral and acromioclavicular joints. Findings compatible with chronic rotator cuff insufficiency. Electronically Signed   By: Lovena Le M.D.   On: 08/27/2019 02:26   CT Renal Stone Study  Result Date: 08/13/2019 CLINICAL DATA:  Dysuria. Acute kidney injury. EXAM: CT ABDOMEN AND PELVIS WITHOUT CONTRAST TECHNIQUE: Multidetector CT imaging of the abdomen and pelvis was performed following the standard protocol without IV contrast. COMPARISON:  CT 05/19/2019 FINDINGS: Lower chest: Linear atelectasis in the right lower lobe. No pleural fluid. Hepatobiliary: Post cholecystectomy. No evidence of focal lesion. No biliary dilatation. Pancreas: No ductal dilatation or inflammation. Spleen: Normal in size without focal abnormality. Adrenals/Urinary Tract: No adrenal nodule. No renal stones or hydronephrosis. No perinephric edema. No evidence of ureteral calculi. Urinary bladder is physiologically distended. No bladder wall thickening. No bladder stone. Stomach/Bowel: Densely calcified lesion abutting the lesser curvature of the stomach, unchanged from prior exams. Stomach is distended with ingested material. No small bowel obstruction or inflammation. Appendix not confidently visualized. Small volume of stool in  the colon. Colonic interposition under the right hemidiaphragm. No colonic wall thickening or inflammatory change. Vascular/Lymphatic: Moderate aorto bi-iliac atherosclerosis. No aortic aneurysm. No bulky abdominopelvic adenopathy. Reproductive: Post hysterectomy. No adnexal mass. Other: No free air, free fluid, or intra-abdominal fluid collection.  Musculoskeletal: Chronic compression deformities in the lower thoracic and lumbar spine, unchanged from prior exam. Vertebral augmentation of T11 and T12. No acute osseous abnormality. IMPRESSION: 1. No renal stones, obstructive uropathy, or explanation for dysuria and acute kidney injury. 2. No acute findings in the abdomen/pelvis. 3. Chronic calcified lesion arising from the lesser curvature of the stomach, stable from prior exams and presumably benign. Electronically Signed   By: Keith Rake M.D.   On: 08/13/2019 23:14    (Echo, Carotid, EGD, Colonoscopy, ERCP)    Subjective: Sitting up in bed wants to go home  Discharge Exam: Vitals:   09/03/19 0457 09/03/19 0805  BP: (!) 153/80   Pulse: 87   Resp: 18   Temp: 98.2 F (36.8 C)   SpO2: 97% 98%   Vitals:   09/02/19 2041 09/02/19 2130 09/03/19 0457 09/03/19 0805  BP:  (!) 142/72 (!) 153/80   Pulse:  83 87   Resp: (!) 24 19 18    Temp:  98.5 F (36.9 C) 98.2 F (36.8 C)   TempSrc:  Oral Oral   SpO2:  97% 97% 98%  Weight:   49.8 kg   Height:        General: Pt is alert, awake, not in acute distress Cardiovascular: RRR, S1/S2 +, no rubs, no gallops Respiratory: CTA bilaterally, no wheezing, no rhonchi Abdominal: Soft, NT, ND, bowel sounds + Extremities: no edema, no cyanosis    The results of significant diagnostics from this hospitalization (including imaging, microbiology, ancillary and laboratory) are listed below for reference.     Microbiology: Recent Results (from the past 240 hour(s))  SARS CORONAVIRUS 2 (TAT 6-24 HRS) Nasopharyngeal Nasopharyngeal Swab     Status: None   Collection Time: 08/27/19  3:24 AM   Specimen: Nasopharyngeal Swab  Result Value Ref Range Status   SARS Coronavirus 2 NEGATIVE NEGATIVE Final    Comment: (NOTE) SARS-CoV-2 target nucleic acids are NOT DETECTED. The SARS-CoV-2 RNA is generally detectable in upper and lower respiratory specimens during the acute phase of infection.  Negative results do not preclude SARS-CoV-2 infection, do not rule out co-infections with other pathogens, and should not be used as the sole basis for treatment or other patient management decisions. Negative results must be combined with clinical observations, patient history, and epidemiological information. The expected result is Negative. Fact Sheet for Patients: SugarRoll.be Fact Sheet for Healthcare Providers: https://www.woods-.com/ This test is not yet approved or cleared by the [place] FDA and  has been authorized for detection and/or diagnosis of SARS-CoV-2 by FDA under an Emergency Use Authorization (EUA). This EUA will remain  in effect (meaning this test can be used) for the duration of the COVID-19 declaration under Section 56 4(b)(1) of the Act, 21 U.S.C. section 360bbb-3(b)(1), unless the authorization is terminated or revoked sooner. Performed at [facility], [place] [address], [place], [place] [address]      Labs: BNP (last 3 results) No results for input(s): BNP in the last 8760 hours. Basic Metabolic Panel: Recent Labs  Lab 08/28/19 0349 08/29/19 0311 08/30/19 0422 09/02/19 0255 09/03/19 0315  NA 141 142 141 140 141  K 3.5 2.9* 3.8 3.3* 3.3*  CL 111 109 108 109 109  CO2  20* 20* 21* 23 22  GLUCOSE 130* 90 101* 98 106*  BUN 5* <5* <5* 7* 5*  CREATININE 0.72 0.57 0.57 0.72 0.62  CALCIUM 8.2* 8.3* 9.0 8.7* 8.8*  MG  --  1.4* 1.7  --   --    Liver Function Tests: No results for input(s): AST, ALT, ALKPHOS, BILITOT, PROT, ALBUMIN in the last 168 hours. No results for input(s): LIPASE, AMYLASE in the last 168 hours. No results for input(s): AMMONIA in the last 168 hours. CBC: Recent Labs  Lab 08/28/19 0349 08/29/19 0311 08/30/19 0422  WBC 5.0 6.8 8.4  HGB 9.7* 9.6* 10.5*  HCT 31.3* 30.6* 33.2*  MCV 91.5 90.5 90.0  PLT 263 255 284   Cardiac Enzymes: No results for input(s): CKTOTAL,  CKMB, CKMBINDEX, TROPONINI in the last 168 hours. BNP: Invalid input(s): POCBNP CBG: Recent Labs  Lab 08/28/19 1606  GLUCAP 128*   D-Dimer No results for input(s): DDIMER in the last 72 hours. Hgb A1c No results for input(s): HGBA1C in the last 72 hours. Lipid Profile No results for input(s): CHOL, HDL, LDLCALC, TRIG, CHOLHDL, LDLDIRECT in the last 72 hours. Thyroid function studies No results for input(s): TSH, T4TOTAL, T3FREE, THYROIDAB in the last 72 hours.  Invalid input(s): FREET3 Anemia work up No results for input(s): VITAMINB12, FOLATE, FERRITIN, TIBC, IRON, RETICCTPCT in the last 72 hours. Urinalysis    Component Value Date/Time   COLORURINE YELLOW 08/27/2019 0213   APPEARANCEUR CLEAR 08/27/2019 0213   LABSPEC 1.017 08/27/2019 0213   PHURINE 5.0 08/27/2019 0213   GLUCOSEU NEGATIVE 08/27/2019 0213   HGBUR NEGATIVE 08/27/2019 [place] 08/27/2019 0213   BILIRUBINUR negative 09/10/2018 1523   KETONESUR 5 (A) 08/27/2019 0213   PROTEINUR 100 (A) 08/27/2019 0213   UROBILINOGEN 2.0 (H) 08/13/2019 1542   NITRITE NEGATIVE 08/27/2019 0213   LEUKOCYTESUR SMALL (A) 08/27/2019 0213   Sepsis Labs Invalid input(s): PROCALCITONIN,  WBC,  LACTICIDVEN Microbiology Recent Results (from the past 240 hour(s))  SARS CORONAVIRUS 2 (TAT 6-24 HRS) Nasopharyngeal Nasopharyngeal Swab     Status: None   Collection Time: 08/27/19  3:24 AM   Specimen: Nasopharyngeal Swab  Result Value Ref Range Status   SARS Coronavirus 2 NEGATIVE NEGATIVE Final    Comment: (NOTE) SARS-CoV-2 target nucleic acids are NOT DETECTED. The SARS-CoV-2 RNA is generally detectable in upper and lower respiratory specimens during the acute phase of infection. Negative results do not preclude SARS-CoV-2 infection, do not rule out co-infections with other pathogens, and should not be used as the sole basis for treatment or other patient management decisions. Negative results must be combined  with clinical observations, patient history, and epidemiological information. The expected result is Negative. Fact Sheet for Patients: SugarRoll.be Fact Sheet for Healthcare Providers: https://www.woods-[place].com/ This test is not yet approved or cleared by the [place] FDA and  has been authorized for detection and/or diagnosis of SARS-CoV-2 by FDA under an Emergency Use Authorization (EUA). This EUA will remain  in effect (meaning this test can be used) for the duration of the COVID-19 declaration under Section 56 4(b)(1) of the Act, 21 U.S.C. section 360bbb-3(b)(1), unless the authorization is terminated or revoked sooner. Performed at [facility], [place] [address], [place], [place] [address]      Time coordinating discharge:  39 minutes  SIGNED:   Georgette Shell, MD  [facility] 09/03/2019, 9:02 AM Pager   If 7PM-7AM, please contact night-coverage [email or web address] Password [email or web address]

## 2019-09-05 DIAGNOSIS — K3184 Gastroparesis: Secondary | ICD-10-CM

## 2019-09-05 DIAGNOSIS — R55 Syncope and collapse: Secondary | ICD-10-CM

## 2019-09-05 DIAGNOSIS — J449 Chronic obstructive pulmonary disease, unspecified: Secondary | ICD-10-CM

## 2019-09-05 DIAGNOSIS — I110 Hypertensive heart disease with heart failure: Secondary | ICD-10-CM

## 2019-09-05 DIAGNOSIS — E1143 Type 2 diabetes mellitus with diabetic autonomic (poly)neuropathy: Secondary | ICD-10-CM

## 2019-09-05 DIAGNOSIS — G43909 Migraine, unspecified, not intractable, without status migrainosus: Secondary | ICD-10-CM

## 2019-09-05 DIAGNOSIS — M069 Rheumatoid arthritis, unspecified: Secondary | ICD-10-CM

## 2019-09-05 DIAGNOSIS — E785 Hyperlipidemia, unspecified: Secondary | ICD-10-CM

## 2019-09-05 DIAGNOSIS — G4733 Obstructive sleep apnea (adult) (pediatric): Secondary | ICD-10-CM

## 2019-09-05 DIAGNOSIS — I5032 Chronic diastolic (congestive) heart failure: Secondary | ICD-10-CM

## 2019-09-05 DIAGNOSIS — M545 Low back pain: Secondary | ICD-10-CM

## 2019-09-05 DIAGNOSIS — G8929 Other chronic pain: Secondary | ICD-10-CM

## 2019-09-06 ENCOUNTER — Encounter (HOSPITAL_COMMUNITY): Payer: Self-pay | Admitting: Emergency Medicine

## 2019-09-06 ENCOUNTER — Emergency Department (HOSPITAL_COMMUNITY): Payer: Medicare Other

## 2019-09-06 ENCOUNTER — Other Ambulatory Visit: Payer: Self-pay

## 2019-09-06 ENCOUNTER — Telehealth: Payer: Self-pay

## 2019-09-06 ENCOUNTER — Emergency Department (HOSPITAL_COMMUNITY)
Admission: EM | Admit: 2019-09-06 | Discharge: 2019-09-06 | Disposition: A | Discharge status: Home / Self Care | Payer: Medicare Other | Attending: Emergency Medicine | Admitting: Emergency Medicine

## 2019-09-06 ENCOUNTER — Ambulatory Visit (INDEPENDENT_AMBULATORY_CARE_PROVIDER_SITE_OTHER): Payer: Medicare Other

## 2019-09-06 DIAGNOSIS — I110 Hypertensive heart disease with heart failure: Secondary | ICD-10-CM | POA: Insufficient documentation

## 2019-09-06 DIAGNOSIS — Z79899 Other long term (current) drug therapy: Secondary | ICD-10-CM | POA: Diagnosis not present

## 2019-09-06 DIAGNOSIS — R0602 Shortness of breath: Secondary | ICD-10-CM | POA: Insufficient documentation

## 2019-09-06 DIAGNOSIS — E876 Hypokalemia: Secondary | ICD-10-CM

## 2019-09-06 DIAGNOSIS — E118 Type 2 diabetes mellitus with unspecified complications: Secondary | ICD-10-CM | POA: Diagnosis not present

## 2019-09-06 DIAGNOSIS — IMO0002 Reserved for concepts with insufficient information to code with codable children: Secondary | ICD-10-CM

## 2019-09-06 DIAGNOSIS — E1165 Type 2 diabetes mellitus with hyperglycemia: Secondary | ICD-10-CM

## 2019-09-06 DIAGNOSIS — I10 Essential (primary) hypertension: Secondary | ICD-10-CM

## 2019-09-06 DIAGNOSIS — N183 Chronic kidney disease, stage 3 unspecified: Secondary | ICD-10-CM | POA: Diagnosis not present

## 2019-09-06 DIAGNOSIS — Z7984 Long term (current) use of oral hypoglycemic drugs: Secondary | ICD-10-CM | POA: Diagnosis not present

## 2019-09-06 DIAGNOSIS — E119 Type 2 diabetes mellitus without complications: Secondary | ICD-10-CM | POA: Insufficient documentation

## 2019-09-06 DIAGNOSIS — I5032 Chronic diastolic (congestive) heart failure: Secondary | ICD-10-CM | POA: Insufficient documentation

## 2019-09-06 LAB — BRAIN NATRIURETIC PEPTIDE: B Natriuretic Peptide: 45.5 pg/mL (ref 0.0–100.0)

## 2019-09-06 LAB — CBC WITH DIFFERENTIAL/PLATELET
Abs Immature Granulocytes: 0.06 10*3/uL (ref 0.00–0.07)
Basophils Absolute: 0 10*3/uL (ref 0.0–0.1)
Basophils Relative: 0 %
Eosinophils Absolute: 0.1 10*3/uL (ref 0.0–0.5)
Eosinophils Relative: 1 %
HCT: 35.9 % — ABNORMAL LOW (ref 36.0–46.0)
Hemoglobin: 11.1 g/dL — ABNORMAL LOW (ref 12.0–15.0)
Immature Granulocytes: 1 %
Lymphocytes Relative: 30 %
Lymphs Abs: 2.2 10*3/uL (ref 0.7–4.0)
MCH: 28.9 pg (ref 26.0–34.0)
MCHC: 30.9 g/dL (ref 30.0–36.0)
MCV: 93.5 fL (ref 80.0–100.0)
Monocytes Absolute: 1 10*3/uL (ref 0.1–1.0)
Monocytes Relative: 13 %
Neutro Abs: 4 10*3/uL (ref 1.7–7.7)
Neutrophils Relative %: 55 %
Platelets: 348 10*3/uL (ref 150–400)
RBC: 3.84 MIL/uL — ABNORMAL LOW (ref 3.87–5.11)
RDW: 17.1 % — ABNORMAL HIGH (ref 11.5–15.5)
WBC: 7.4 10*3/uL (ref 4.0–10.5)
nRBC: 0 % (ref 0.0–0.2)

## 2019-09-06 LAB — COMPREHENSIVE METABOLIC PANEL
ALT: 43 U/L (ref 0–44)
AST: 113 U/L — ABNORMAL HIGH (ref 15–41)
Albumin: 3.2 g/dL — ABNORMAL LOW (ref 3.5–5.0)
Alkaline Phosphatase: 43 U/L (ref 38–126)
Anion gap: 8 (ref 5–15)
BUN: 13 mg/dL (ref 8–23)
CO2: 24 mmol/L (ref 22–32)
Calcium: 8.7 mg/dL — ABNORMAL LOW (ref 8.9–10.3)
Chloride: 106 mmol/L (ref 98–111)
Creatinine, Ser: 1.45 mg/dL — ABNORMAL HIGH (ref 0.44–1.00)
GFR calc Af Amer: 39 mL/min — ABNORMAL LOW (ref >60)
GFR calc non Af Amer: 34 mL/min — ABNORMAL LOW (ref >60)
Glucose, Bld: 116 mg/dL — ABNORMAL HIGH (ref 70–99)
Sodium: 138 mmol/L (ref 135–145)
Total Bilirubin: 1.7 mg/dL — ABNORMAL HIGH (ref 0.3–1.2)
Total Protein: 6.1 g/dL — ABNORMAL LOW (ref 6.5–8.1)

## 2019-09-06 LAB — TROPONIN I (HIGH SENSITIVITY)
Troponin I (High Sensitivity): 41 ng/L — ABNORMAL HIGH (ref <18)
Troponin I (High Sensitivity): 47 ng/L — ABNORMAL HIGH (ref <18)

## 2019-09-06 MED ORDER — IOHEXOL 350 MG/ML SOLN
100.0000 mL | Freq: Once | INTRAVENOUS | Status: AC | PRN
  Administered 2019-09-06: 50 mL via INTRAVENOUS

## 2019-09-06 MED ORDER — SODIUM CHLORIDE 0.9 % IV BOLUS
1000.0000 mL | Freq: Once | INTRAVENOUS | Status: AC
  Administered 2019-09-06: 1000 mL via INTRAVENOUS

## 2019-09-06 MED ORDER — SODIUM CHLORIDE (PF) 0.9 % IJ SOLN
INTRAMUSCULAR | Status: AC
  Filled 2019-09-06: qty 50

## 2019-09-06 NOTE — ED Notes (Signed)
Left message for [place] [facility], who stated earlier he would pick up pt.

## 2019-09-06 NOTE — ED Provider Notes (Signed)
[facility] DEPT Provider Note   CSN: [id number] Arrival date & time: 09/06/19  1058     History Chief Complaint  Patient presents with  . Shortness of Breath    [name] is a 84 y.o. female.  Patient is an 84 year old female with history of COPD, CHF, anxiety, diabetes.  She presents today for evaluation of shortness of breath.  She tells me she went to bed yesterday evening feeling well, then woke this morning feeling as if she could not take a deep breath.  She denies chest pain, fevers, chills, or cough.  She denies any swelling of her legs.  She denies any ill contacts.  She has been tested on multiple occasions for Covid, however all have come back negative.  The history is provided by the patient.  Shortness of Breath Severity:  Moderate Onset quality:  Sudden Duration:  3 hours Timing:  Constant Progression:  Unchanged Chronicity:  New Context: activity   Relieved by:  Nothing Worsened by:  Nothing Ineffective treatments:  None tried Associated symptoms: no fever and no sputum production        Past Medical History:  Diagnosis Date  . Allergy   . Anemia    "when I was young"  . Anginal pain ([place])   . Anxiety   . Arthritis    "all over my body"   . Chest pain 04/07/2014  . Chronic diastolic heart failure, NYHA class 1 ([place]) 01/22/2015  . Chronic lower back pain   . Chronic steroid use 12/11/2012  . COPD (chronic obstructive pulmonary disease) ([place]) 01/09/2017  . 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 Elevated lactic acid level 01/22/2015  . Essential hypertension 06/15/2018  . Frequent UTI    "recently" (06/14/2015)  . Gastric mass   . GERD (gastroesophageal reflux disease)   . Headache(784.0)    "weekly; wake up w/them " (110/19/2018)  . Heart murmur   . High cholesterol   . History of blood transfusion ~ 1954   "related to my periods"  . Migraine    "once/month maybe" (05/16/2017)  .  Multiple falls 06/14/2015  . Noncompliance 12/11/2012  . OSA (obstructive sleep apnea)    "I think I'm using a BiPAP" (06/14/2015)  . Sepsis ([place]) 01/09/2017  . Syncope 08/27/2019  . Syncope and collapse 12/11/2012  . Tachycardia with heart rate 100-120 beats per minute 04/08/2014  . Type II diabetes mellitus ([place])   . Uncontrolled type 2 diabetes mellitus with complication [facility])     Patient Active Problem List   Diagnosis Date Noted  . DNR (do not resuscitate) 08/02/2019  . Dehydration 08/01/2019  . Urinary tract infection symptoms 07/31/2019  . Diabetic gastroparesis ([place]) 07/31/2019  . Unexplained weight loss 07/12/2019  . Non-intractable vomiting   . Nausea, vomiting, and diarrhea 05/30/2019  . Hypokalemia 05/30/2019  . Hypomagnesemia 05/30/2019  . Gastritis 05/30/2019  . Elevated lipase 05/30/2019  . SIRS (systemic inflammatory response syndrome) ([place]) 05/30/2019  . History of COPD   . Other chronic pain 04/22/2019  . Dyspnea 08/10/2018  . Acute diastolic heart failure ([place]) 06/15/2018  . Essential hypertension 06/15/2018  . 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 Gastric mass   . Sepsis secondary to UTI ([place])   . Uncontrolled type 2 diabetes mellitus with complication ([place])   . AKI (acute kidney injury) ([place])   . Chronic diastolic CHF (congestive heart failure) ([place])   . E-coli UTI 01/09/2017  . Sepsis ([place]) 01/09/2017  .  Constipation 01/09/2017  . COPD (chronic obstructive pulmonary disease) ([place]) 01/09/2017  . Multiple falls 06/14/2015  . Weight loss 06/14/2015  . Acute respiratory failure with hypoxia ([place]) 06/14/2015  . Orthostasis 06/14/2015  . Acute respiratory failure ([place]) 06/14/2015  . Elevated lactic acid level 01/22/2015  . 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 Chronic diastolic heart failure, NYHA class 1 ([place]) 01/22/2015  . Tachycardia with heart rate 100-120 beats per minute 04/08/2014  . DM (diabetes mellitus) ([place]) 04/08/2014  . Chest pain 04/07/2014  .  Syncope 04/07/2014  . Hyperglycemia 12/11/2012  . Syncope and collapse 12/11/2012  . Dehydration, mild 12/11/2012  . Noncompliance 12/11/2012  . UTI (lower urinary tract infection) 12/11/2012  . 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 Rheumatoid arthritis ([place]) 12/11/2012    Past Surgical History:  Procedure Laterality Date  . APPENDECTOMY  1970   Archie Endo 12/12/2010  . BACK SURGERY    . BIOPSY  05/25/2019   Procedure: BIOPSY;  Surgeon: Rush Landmark Telford Nab., MD;  Location: [facility];  Service: Gastroenterology;;  . [place]   Archie Endo 12/12/2010  . ESOPHAGOGASTRODUODENOSCOPY N/A 05/25/2019   Procedure: ESOPHAGOGASTRODUODENOSCOPY (EGD);  Surgeon: Irving Copas., MD;  Location: [facility];  Service: Gastroenterology;  Laterality: N/A;  . EUS N/A 10/16/2017   Procedure: UPPER ENDOSCOPIC ULTRASOUND (EUS) RADIAL;  Surgeon: Milus Banister, MD;  Location: [facility];  Service: Endoscopy;  Laterality: N/A;  . FRACTURE SURGERY    . LAPAROSCOPIC CHOLECYSTECTOMY  1990's  . LUMBAR MICRODISCECTOMY Left 09/2006   L5-S1/notes 12/12/2010  . MULTIPLE EXTRACTIONS WITH ALVEOLOPLASTY Bilateral 06/13/2017   Procedure: MULTIPLE EXTRACTION WITH ALVEOLOPLASTY;  Surgeon: Diona Browner, DDS;  Location: [facility];  Service: Oral Surgery;  Laterality: Bilateral;  . MULTIPLE TOOTH EXTRACTIONS    . ORIF SHOULDER FRACTURE Left ~ 2009   "put cement down in it"   . TONSILLECTOMY  1950's?  . TOTAL ABDOMINAL HYSTERECTOMY  1979   Archie Endo 12/12/2010     OB History   No obstetric history on file.     Family History  Problem Relation Age of Onset  . Hypertension Mother   . Heart attack Father   . Diabetes Mellitus II Sister   . Hypertension Sister   . Cancer - Other Sister   . Migraines Niece   . Colon cancer Neg Hx     Social History   Tobacco Use  . Smoking status: Former Smoker    Types: Cigarettes  . Smokeless tobacco: Never Used  . Tobacco  comment: 05/16/2017"1 pack of cigarettes would last 3-4 months; haven't had any cigarettes for 40 years"  Substance Use Topics  . Alcohol use: Not Currently    Comment: 05/16/2017 "used to drink beer in the 1990's; don't drink at all now"  . Drug use: No    Home Medications Prior to Admission medications   Medication Sig Start Date End Date Taking? Authorizing Provider  albuterol (ACCUNEB) 0.63 MG/3ML nebulizer solution Take 2 ampules by nebulization every 6 (six) hours as needed for wheezing.    [provider]  aspirin EC 81 MG tablet Take 1 tablet (81 mg total) by mouth every Monday, Wednesday, and Friday. 08/04/19   Geradine Girt, DO  atorvastatin (LIPITOR) 10 MG tablet Take 1 Tablet by mouth daily Patient taking differently: Take 10 mg by mouth daily at 6 PM.  07/07/19   Rodriguez-Southworth, Sunday Spillers, PA-C  carvedilol (COREG) 6.25 MG tablet Take 1 tablet (6.25 mg total) by  mouth 2 (two) times daily with a meal. 09/02/19 11/01/19  Antonieta Pert, MD  dicyclomine (BENTYL) 10 MG capsule Take 1 capsule (10 mg total) by mouth every 8 (eight) hours as needed for spasms. 08/23/19   Armbruster, Carlota Raspberry, MD  docusate sodium (COLACE) 100 MG capsule Take 1 capsule (100 mg total) by mouth 2 (two) times daily. 08/03/19   Geradine Girt, DO  glucose blood (ONETOUCH VERIO) test strip Use to check blood sugar 3 to 4 times a day before meals 03/25/19   Rodriguez-Southworth, Sunday Spillers, PA-C  hydrOXYzine (ATARAX/VISTARIL) 10 MG tablet 1 q 8h prn itching Patient taking differently: Take 10 mg by mouth every 8 (eight) hours as needed for itching.  04/22/19   Rodriguez-Southworth, Sunday Spillers, PA-C  magnesium chloride (SLOW-MAG) 64 MG TBEC SR tablet Take 1 tablet (64 mg total) by mouth 2 (two) times daily. 08/13/19   Quintella Reichert, MD  methylPREDNISolone (MEDROL) 4 MG tablet Take 4 mg by mouth daily.    Rosita Kea, PA-C  metoCLOPramide (REGLAN) 5 MG tablet Take 1 tablet (5 mg total) by mouth 3 (three) times daily before  meals. 08/23/19   Armbruster, Carlota Raspberry, MD  mometasone-formoterol (DULERA) 200-5 MCG/ACT AERO Inhale 2 puffs into the lungs 2 (two) times daily.    [provider]  ondansetron (ZOFRAN ODT) 4 MG disintegrating tablet Take 1 tablet (4 mg total) by mouth every 8 (eight) hours as needed for up to 30 doses for nausea or vomiting. 09/02/19   Antonieta Pert, MD  pantoprazole (PROTONIX) 40 MG tablet Take 1 tablet (40 mg total) by mouth daily for 30 doses. 09/02/19 10/02/19  Antonieta Pert, MD  polyethylene glycol (MIRALAX) 17 g packet Take 17 g by mouth daily. 08/23/19   Armbruster, Carlota Raspberry, MD  Potassium Chloride 40 MEQ/15ML (20%) SOLN Take 20 mEq by mouth daily for 10 days. 08/13/19 08/23/19  Fatima Blank, MD  simethicone (GAS-X EXTRA STRENGTH) 125 MG chewable tablet Chew 125 mg by mouth every 6 (six) hours as needed for flatulence.    [provider]  traMADol (ULTRAM) 50 MG tablet Take 1 tablet (50 mg total) by mouth every 6 (six) hours as needed. Patient not taking: Reported on 08/23/2019 07/15/19   Rodriguez-Southworth, Sunday Spillers, PA-C    Allergies    Lactose intolerance (gi)  Review of Systems   Review of Systems  Constitutional: Negative for fever.  Respiratory: Positive for shortness of breath. Negative for sputum production.   All other systems reviewed and are negative.   Physical Exam Updated Vital Signs Pulse (!) 102   Temp 98.3 F (36.8 C) (Oral)   Resp 18   SpO2 99%   Physical Exam Vitals and nursing note reviewed.  Constitutional:      General: She is not in acute distress.    Appearance: She is well-developed. She is not diaphoretic.  HENT:     Head: Normocephalic and atraumatic.  Cardiovascular:     Rate and Rhythm: Normal rate and regular rhythm.     Heart sounds: No murmur. No friction rub. No gallop.   Pulmonary:     Effort: Pulmonary effort is normal. No respiratory distress.     Breath sounds: Normal breath sounds. No wheezing.  Abdominal:      General: Bowel sounds are normal. There is no distension.     Palpations: Abdomen is soft.     Tenderness: There is no abdominal tenderness.  Musculoskeletal:        General:  Normal range of motion.     Cervical back: Normal range of motion and neck supple.     Right lower leg: No tenderness. No edema.     Left lower leg: No tenderness. No edema.  Skin:    General: Skin is warm and dry.  Neurological:     Mental Status: She is alert and oriented to person, place, and time.     ED Results / Procedures / Treatments   Labs (all labs ordered are listed, but only abnormal results are displayed) Labs Reviewed - No data to display  EKG None  Radiology No results found.  Procedures Procedures (including critical care time)  Medications Ordered in ED Medications - No data to display  ED Course  I have reviewed the triage vital signs and the nursing notes.  Pertinent labs & imaging results that were available during my care of the patient were reviewed by me and considered in my medical decision making (see chart for details).    MDM Rules/Calculators/A&P  Patient presenting here with complaints of dyspnea.  This is been present for the past 2 weeks.  Patient also describes weakness and fatigue.  Patient with recent admission for the same and was discharged several days ago.  Patient was offered SNF placement, however adamantly refused.  She was discharged to home, now returns here feeling the same way.  Patient's work-up thus far unremarkable including laboratory studies and chest x-ray.  Her EKG is unchanged.  Upon reviewing prior records, I do not feel as though a pulmonary embolism has been ruled out.  Patient to undergo CT angiogram of the chest to further evaluate this possibility.  Care will be signed out to Dr. Laverta Baltimore at shift change.  He will obtain the results of the study and determine the final disposition.  Final Clinical Impression(s) / ED Diagnoses Final diagnoses:    None    Rx / DC Orders ED Discharge Orders    None       Veryl Speak, MD 09/07/19 [phone]

## 2019-09-06 NOTE — Chronic Care Management (AMB) (Signed)
Chronic Care Management   Follow Up Note   09/06/2019 Name: [name] MRN: [id number] DOB: January 24, 1936  Referred by: [name], PA-C Reason for referral : Chronic Care Management (CCM RN CM Case Review)   [name] is a 84 y.o. year old female who is a primary care patient of [facility], Sunday Spillers, [place]. The CCM team was consulted for assistance with chronic disease management and care coordination needs.    Review of patient status, including review of consultants reports, relevant laboratory and other test results, and collaboration with appropriate care team members and the patient's provider was performed as part of comprehensive patient evaluation and provision of chronic care management services.     Outpatient Encounter Medications as of 09/06/2019  Medication Sig Note  . albuterol (ACCUNEB) 0.63 MG/3ML nebulizer solution Take 2 ampules by nebulization every 6 (six) hours as needed for wheezing.   Marland Kitchen aspirin EC 81 MG tablet Take 1 tablet (81 mg total) by mouth every Monday, Wednesday, and Friday.   Marland Kitchen atorvastatin (LIPITOR) 10 MG tablet Take 1 Tablet by mouth daily (Patient taking differently: Take 10 mg by mouth daily at 6 PM. )   . carvedilol (COREG) 6.25 MG tablet Take 1 tablet (6.25 mg total) by mouth 2 (two) times daily with a meal.   . dicyclomine (BENTYL) 10 MG capsule Take 1 capsule (10 mg total) by mouth every 8 (eight) hours as needed for spasms.   Marland Kitchen docusate sodium (COLACE) 100 MG capsule Take 1 capsule (100 mg total) by mouth 2 (two) times daily.   Marland Kitchen glucose blood (ONETOUCH VERIO) test strip Use to check blood sugar 3 to 4 times a day before meals   . hydrOXYzine (ATARAX/VISTARIL) 10 MG tablet 1 q 8h prn itching (Patient taking differently: Take 10 mg by mouth every 8 (eight) hours as needed for itching. )   . magnesium chloride (SLOW-MAG) 64 MG TBEC SR tablet Take 1 tablet (64 mg total) by mouth 2 (two) times daily.   . methylPREDNISolone  (MEDROL) 4 MG tablet Take 4 mg by mouth daily.   . metoCLOPramide (REGLAN) 5 MG tablet Take 1 tablet (5 mg total) by mouth 3 (three) times daily before meals.   . mometasone-formoterol (DULERA) 200-5 MCG/ACT AERO Inhale 2 puffs into the lungs 2 (two) times daily.   . ondansetron (ZOFRAN ODT) 4 MG disintegrating tablet Take 1 tablet (4 mg total) by mouth every 8 (eight) hours as needed for up to 30 doses for nausea or vomiting.   . pantoprazole (PROTONIX) 40 MG tablet Take 1 tablet (40 mg total) by mouth daily for 30 doses.   . polyethylene glycol (MIRALAX) 17 g packet Take 17 g by mouth daily.   . Potassium Chloride 40 MEQ/15ML (20%) SOLN Take 20 mEq by mouth daily for 10 days.   . simethicone (GAS-X EXTRA STRENGTH) 125 MG chewable tablet Chew 125 mg by mouth every 6 (six) hours as needed for flatulence.   . traMADol (ULTRAM) 50 MG tablet Take 1 tablet (50 mg total) by mouth every 6 (six) hours as needed. (Patient not taking: Reported on 08/23/2019) 08/13/2019: Needs Refilled   No facility-administered encounter medications on file as of 09/06/2019.     Objective:  Lab Results  Component Value Date   HGBA1C 7.9 (H) 05/20/2019   HGBA1C 8.2 (H) 03/11/2019   HGBA1C 8.0 (H) 02/18/2019   Lab Results  Component Value Date   MICROALBUR 10 09/10/2018   [place] 58 05/30/2019   CREATININE  1.45 (H) 09/06/2019   BP Readings from Last 3 Encounters:  09/06/19 (!) 144/70  09/03/19 (!) 162/81  08/23/19 140/72   In preparation to contact patient for a CCM RN CM outreach to assess for CCM RN needs and to f/u on patient's recent IP admission, it is noted that Ms. Oien was transferred today via [place] from her home to [facility] Emergency Department.  It is noted that Ms. Edinger's home health nurse instructed her to call 911 due to reporting worsening shortness of breath.   Per review of patient's medical record, she was discharged home from [facility] on 09/03/19  after a 7 day admission for dx: syncope and dehydration secondary to acute illness.   It is further noted that Ms. Freiberger had 6 additional ED visits to [facility] for multiple underlying problems.   The CCM team will continue to monitor for patient's discharge home and will follow up accordingly to offer  disease management support and education and assist with any Care Coordination needs.   Plan:   The care management team will reach out to the patient to assess for CCM needs upon her discharge home.   Barb Merino, RN, BSN, CCM Care Management Coordinator [facility]  Direct Phone: [phone]

## 2019-09-06 NOTE — ED Triage Notes (Signed)
Per [facility] pt from home for SOb when she woke up today. Her home health nurse advised her to call [phone]. Pt able to speak in full sentences and non labored breathing. 130/80, 86HR, 99-100%, CBg 146

## 2019-09-06 NOTE — Discharge Instructions (Signed)
You were seen in the emergency department today with shortness of breath.  Your CT scan and lab work are similar to prior.  You may be slightly dehydrated need to have repeat lab work done at your primary care's office to follow your kidney function.  Please drink plenty of fluids.  Discussed with your home health team regarding rehab placement from home as needed.  Return to the emergency department with any new or suddenly worsening symptoms.

## 2019-09-06 NOTE — ED Provider Notes (Signed)
Blood pressure (!) 151/83, pulse (!) 109, temperature 98.3 F (36.8 C), temperature source Oral, resp. rate (!) 25, SpO2 96 %.  Assuming care from Dr. Stark Jock.  In short, [name] is a 84 y.o. female with a chief complaint of Shortness of Breath .  Refer to the original H&P for additional details.  The current plan of care is to f/u on CTA. Labs near baseline from prior discharge. Persistent tachycardia noted.    EKG Interpretation  Date/Time:  Monday September 06 2019 11:10:15 EST Ventricular Rate:  103 PR Interval:    QRS Duration: 67 QT Interval:  331 QTC Calculation: 434 R Axis:   -48 Text Interpretation: Sinus tachycardia LAD, consider left anterior fascicular block Borderline low voltage, extremity leads Confirmed by Veryl Speak [phone]) on 09/06/2019 11:13:23 AM      04:56 PM  CTA of the chest shows small pericardial effusion which was visualized on echo from earlier this month.  Labs show slightly elevated creatinine.  Patient to follow closely with her primary care physician for repeat lab work in the coming week.  Patient feeling subjectively better.  I spoke to her husband [name] on the phone who will come to pick her up.  He feels comfortable with her status at home and will discuss further with the PCP.     Margette Fast, MD 09/06/19 [phone]

## 2019-09-07 ENCOUNTER — Telehealth: Payer: Self-pay

## 2019-09-08 ENCOUNTER — Telehealth: Payer: Self-pay

## 2019-09-08 NOTE — Telephone Encounter (Signed)
[place] Nurse with home health aid LVM for verbal orders PT, OT, Social work, and Home health aid  [phone] Per JM its ok to give verbal order

## 2019-09-16 ENCOUNTER — Emergency Department (HOSPITAL_COMMUNITY): Payer: Medicare Other

## 2019-09-16 ENCOUNTER — Ambulatory Visit: Payer: Medicare Other | Admitting: Internal Medicine

## 2019-09-16 ENCOUNTER — Other Ambulatory Visit: Payer: Self-pay

## 2019-09-16 ENCOUNTER — Encounter (HOSPITAL_COMMUNITY): Payer: Self-pay | Admitting: Pharmacy Technician

## 2019-09-16 ENCOUNTER — Inpatient Hospital Stay (HOSPITAL_COMMUNITY)
Admission: EM | Admit: 2019-09-16 | Discharge: 2019-09-19 | DRG: 064 | Disposition: A | Discharge status: Home Health | Payer: Medicare Other | Attending: Internal Medicine | Admitting: Internal Medicine

## 2019-09-16 ENCOUNTER — Ambulatory Visit: Payer: Medicare Other

## 2019-09-16 DIAGNOSIS — E785 Hyperlipidemia, unspecified: Secondary | ICD-10-CM | POA: Diagnosis present

## 2019-09-16 DIAGNOSIS — I110 Hypertensive heart disease with heart failure: Secondary | ICD-10-CM | POA: Diagnosis present

## 2019-09-16 DIAGNOSIS — E1165 Type 2 diabetes mellitus with hyperglycemia: Secondary | ICD-10-CM | POA: Diagnosis not present

## 2019-09-16 DIAGNOSIS — E119 Type 2 diabetes mellitus without complications: Secondary | ICD-10-CM | POA: Diagnosis present

## 2019-09-16 DIAGNOSIS — G9341 Metabolic encephalopathy: Secondary | ICD-10-CM | POA: Diagnosis present

## 2019-09-16 DIAGNOSIS — Z8249 Family history of ischemic heart disease and other diseases of the circulatory system: Secondary | ICD-10-CM | POA: Diagnosis not present

## 2019-09-16 DIAGNOSIS — Z66 Do not resuscitate: Secondary | ICD-10-CM | POA: Diagnosis present

## 2019-09-16 DIAGNOSIS — J449 Chronic obstructive pulmonary disease, unspecified: Secondary | ICD-10-CM | POA: Diagnosis present

## 2019-09-16 DIAGNOSIS — I10 Essential (primary) hypertension: Secondary | ICD-10-CM | POA: Diagnosis not present

## 2019-09-16 DIAGNOSIS — E7800 Pure hypercholesterolemia, unspecified: Secondary | ICD-10-CM | POA: Diagnosis present

## 2019-09-16 DIAGNOSIS — R4182 Altered mental status, unspecified: Secondary | ICD-10-CM | POA: Diagnosis not present

## 2019-09-16 DIAGNOSIS — Z7982 Long term (current) use of aspirin: Secondary | ICD-10-CM | POA: Diagnosis not present

## 2019-09-16 DIAGNOSIS — R931 Abnormal findings on diagnostic imaging of heart and coronary circulation: Secondary | ICD-10-CM

## 2019-09-16 DIAGNOSIS — E118 Type 2 diabetes mellitus with unspecified complications: Secondary | ICD-10-CM | POA: Diagnosis not present

## 2019-09-16 DIAGNOSIS — M19011 Primary osteoarthritis, right shoulder: Secondary | ICD-10-CM | POA: Diagnosis present

## 2019-09-16 DIAGNOSIS — Z7951 Long term (current) use of inhaled steroids: Secondary | ICD-10-CM

## 2019-09-16 DIAGNOSIS — G8191 Hemiplegia, unspecified affecting right dominant side: Secondary | ICD-10-CM | POA: Diagnosis present

## 2019-09-16 DIAGNOSIS — K3184 Gastroparesis: Secondary | ICD-10-CM | POA: Diagnosis present

## 2019-09-16 DIAGNOSIS — Z833 Family history of diabetes mellitus: Secondary | ICD-10-CM

## 2019-09-16 DIAGNOSIS — E860 Dehydration: Secondary | ICD-10-CM | POA: Diagnosis present

## 2019-09-16 DIAGNOSIS — I5032 Chronic diastolic (congestive) heart failure: Secondary | ICD-10-CM | POA: Diagnosis present

## 2019-09-16 DIAGNOSIS — E43 Unspecified severe protein-calorie malnutrition: Secondary | ICD-10-CM | POA: Diagnosis present

## 2019-09-16 DIAGNOSIS — E1143 Type 2 diabetes mellitus with diabetic autonomic (poly)neuropathy: Secondary | ICD-10-CM | POA: Diagnosis present

## 2019-09-16 DIAGNOSIS — Z681 Body mass index (BMI) 19 or less, adult: Secondary | ICD-10-CM

## 2019-09-16 DIAGNOSIS — J42 Unspecified chronic bronchitis: Secondary | ICD-10-CM | POA: Diagnosis not present

## 2019-09-16 DIAGNOSIS — Z20822 Contact with and (suspected) exposure to covid-19: Secondary | ICD-10-CM | POA: Diagnosis present

## 2019-09-16 DIAGNOSIS — M05742 Rheumatoid arthritis with rheumatoid factor of left hand without organ or systems involvement: Secondary | ICD-10-CM | POA: Diagnosis not present

## 2019-09-16 DIAGNOSIS — I63512 Cerebral infarction due to unspecified occlusion or stenosis of left middle cerebral artery: Secondary | ICD-10-CM | POA: Diagnosis not present

## 2019-09-16 DIAGNOSIS — G4733 Obstructive sleep apnea (adult) (pediatric): Secondary | ICD-10-CM | POA: Diagnosis present

## 2019-09-16 DIAGNOSIS — M069 Rheumatoid arthritis, unspecified: Secondary | ICD-10-CM | POA: Diagnosis present

## 2019-09-16 DIAGNOSIS — G43909 Migraine, unspecified, not intractable, without status migrainosus: Secondary | ICD-10-CM | POA: Diagnosis present

## 2019-09-16 DIAGNOSIS — R29705 NIHSS score 5: Secondary | ICD-10-CM | POA: Diagnosis present

## 2019-09-16 DIAGNOSIS — Z87891 Personal history of nicotine dependence: Secondary | ICD-10-CM

## 2019-09-16 DIAGNOSIS — IMO0002 Reserved for concepts with insufficient information to code with codable children: Secondary | ICD-10-CM

## 2019-09-16 DIAGNOSIS — F0390 Unspecified dementia without behavioral disturbance: Secondary | ICD-10-CM | POA: Diagnosis present

## 2019-09-16 DIAGNOSIS — I6389 Other cerebral infarction: Secondary | ICD-10-CM | POA: Diagnosis not present

## 2019-09-16 DIAGNOSIS — I639 Cerebral infarction, unspecified: Secondary | ICD-10-CM

## 2019-09-16 DIAGNOSIS — Z79899 Other long term (current) drug therapy: Secondary | ICD-10-CM | POA: Diagnosis not present

## 2019-09-16 DIAGNOSIS — K219 Gastro-esophageal reflux disease without esophagitis: Secondary | ICD-10-CM | POA: Diagnosis present

## 2019-09-16 DIAGNOSIS — M05741 Rheumatoid arthritis with rheumatoid factor of right hand without organ or systems involvement: Secondary | ICD-10-CM | POA: Diagnosis not present

## 2019-09-16 LAB — RESPIRATORY PANEL BY RT PCR (FLU A&B, COVID)
Influenza A by PCR: NEGATIVE
Influenza B by PCR: NEGATIVE
SARS Coronavirus 2 by RT PCR: NEGATIVE

## 2019-09-16 LAB — CBC
HCT: 35.7 % — ABNORMAL LOW (ref 36.0–46.0)
Hemoglobin: 11 g/dL — ABNORMAL LOW (ref 12.0–15.0)
MCH: 28.4 pg (ref 26.0–34.0)
MCHC: 30.8 g/dL (ref 30.0–36.0)
MCV: 92 fL (ref 80.0–100.0)
Platelets: 389 10*3/uL (ref 150–400)
RBC: 3.88 MIL/uL (ref 3.87–5.11)
RDW: 15.3 % (ref 11.5–15.5)
WBC: 6.2 10*3/uL (ref 4.0–10.5)
nRBC: 0 % (ref 0.0–0.2)

## 2019-09-16 LAB — I-STAT CHEM 8, ED
BUN: 8 mg/dL (ref 8–23)
Calcium, Ion: 1.15 mmol/L (ref 1.15–1.40)
Chloride: 100 mmol/L (ref 98–111)
Creatinine, Ser: 0.6 mg/dL (ref 0.44–1.00)
Glucose, Bld: 125 mg/dL — ABNORMAL HIGH (ref 70–99)
HCT: 36 % (ref 36.0–46.0)
Hemoglobin: 12.2 g/dL (ref 12.0–15.0)
Potassium: 3.3 mmol/L — ABNORMAL LOW (ref 3.5–5.1)
Sodium: 135 mmol/L (ref 135–145)
TCO2: 27 mmol/L (ref 22–32)

## 2019-09-16 LAB — DIFFERENTIAL
Abs Immature Granulocytes: 0.04 10*3/uL (ref 0.00–0.07)
Basophils Absolute: 0 10*3/uL (ref 0.0–0.1)
Basophils Relative: 1 %
Eosinophils Absolute: 0.1 10*3/uL (ref 0.0–0.5)
Eosinophils Relative: 1 %
Immature Granulocytes: 1 %
Lymphocytes Relative: 38 %
Lymphs Abs: 2.3 10*3/uL (ref 0.7–4.0)
Monocytes Absolute: 0.7 10*3/uL (ref 0.1–1.0)
Monocytes Relative: 12 %
Neutro Abs: 3 10*3/uL (ref 1.7–7.7)
Neutrophils Relative %: 47 %

## 2019-09-16 LAB — COMPREHENSIVE METABOLIC PANEL
ALT: 19 U/L (ref 0–44)
AST: 46 U/L — ABNORMAL HIGH (ref 15–41)
Albumin: 2.9 g/dL — ABNORMAL LOW (ref 3.5–5.0)
Alkaline Phosphatase: 52 U/L (ref 38–126)
Anion gap: 9 (ref 5–15)
BUN: 8 mg/dL (ref 8–23)
CO2: 24 mmol/L (ref 22–32)
Calcium: 8.7 mg/dL — ABNORMAL LOW (ref 8.9–10.3)
Chloride: 100 mmol/L (ref 98–111)
Creatinine, Ser: 0.72 mg/dL (ref 0.44–1.00)
GFR calc Af Amer: >60 mL/min (ref >60)
GFR calc non Af Amer: >60 mL/min (ref >60)
Glucose, Bld: 133 mg/dL — ABNORMAL HIGH (ref 70–99)
Potassium: 3.4 mmol/L — ABNORMAL LOW (ref 3.5–5.1)
Sodium: 133 mmol/L — ABNORMAL LOW (ref 135–145)
Total Bilirubin: 0.4 mg/dL (ref 0.3–1.2)
Total Protein: 6.2 g/dL — ABNORMAL LOW (ref 6.5–8.1)

## 2019-09-16 LAB — CBG MONITORING, ED
Glucose-Capillary: 127 mg/dL — ABNORMAL HIGH (ref 70–99)
Glucose-Capillary: 90 mg/dL (ref 70–99)
Glucose-Capillary: 76 mg/dL (ref 70–99)

## 2019-09-16 LAB — APTT: aPTT: 30 seconds (ref 24–36)

## 2019-09-16 LAB — PROTIME-INR
INR: 1 (ref 0.8–1.2)
Prothrombin Time: 12.9 seconds (ref 11.4–15.2)

## 2019-09-16 LAB — ETHANOL: Alcohol, Ethyl (B): <10 mg/dL (ref <10)

## 2019-09-16 MED ORDER — ALBUTEROL SULFATE (2.5 MG/3ML) 0.083% IN NEBU: 2.5000 mg | INHALATION_SOLUTION | RESPIRATORY_TRACT | Status: DC | PRN

## 2019-09-16 MED ORDER — ACETAMINOPHEN 325 MG PO TABS
650.0000 mg | ORAL_TABLET | ORAL | Status: DC | PRN
  Administered 2019-09-17 – 2019-09-18 (×3): 650 mg via ORAL
  Filled 2019-09-16 (×3): qty 2

## 2019-09-16 MED ORDER — ACETAMINOPHEN 650 MG RE SUPP: 650.0000 mg | RECTAL | Status: DC | PRN

## 2019-09-16 MED ORDER — INSULIN ASPART 100 UNIT/ML ~~LOC~~ SOLN: 0.0000 [IU] | Freq: Every day | SUBCUTANEOUS | Status: DC

## 2019-09-16 MED ORDER — SODIUM CHLORIDE 0.9 % IV SOLN: INTRAVENOUS | Status: DC

## 2019-09-16 MED ORDER — INSULIN ASPART 100 UNIT/ML ~~LOC~~ SOLN: 0.0000 [IU] | Freq: Three times a day (TID) | SUBCUTANEOUS | Status: DC

## 2019-09-16 MED ORDER — HYDROXYZINE HCL 10 MG PO TABS
10.0000 mg | ORAL_TABLET | Freq: Three times a day (TID) | ORAL | Status: DC | PRN
  Administered 2019-09-18 (×2): 10 mg via ORAL
  Filled 2019-09-16 (×4): qty 1

## 2019-09-16 MED ORDER — METOCLOPRAMIDE HCL 10 MG PO TABS
5.0000 mg | ORAL_TABLET | Freq: Three times a day (TID) | ORAL | Status: DC
  Administered 2019-09-16 – 2019-09-19 (×8): 5 mg via ORAL
  Filled 2019-09-16 (×8): qty 1

## 2019-09-16 MED ORDER — MOMETASONE FURO-FORMOTEROL FUM 200-5 MCG/ACT IN AERO
2.0000 | INHALATION_SPRAY | Freq: Two times a day (BID) | RESPIRATORY_TRACT | Status: DC
  Administered 2019-09-17 – 2019-09-19 (×5): 2 via RESPIRATORY_TRACT
  Filled 2019-09-16 (×2): qty 8.8

## 2019-09-16 MED ORDER — POLYETHYLENE GLYCOL 3350 17 G PO PACK
17.0000 g | PACK | Freq: Every day | ORAL | Status: DC
  Administered 2019-09-17 – 2019-09-19 (×2): 17 g via ORAL
  Filled 2019-09-16 (×3): qty 1

## 2019-09-16 MED ORDER — PANTOPRAZOLE SODIUM 40 MG PO TBEC
40.0000 mg | DELAYED_RELEASE_TABLET | Freq: Every day | ORAL | Status: DC
  Administered 2019-09-16 – 2019-09-19 (×4): 40 mg via ORAL
  Filled 2019-09-16 (×4): qty 1

## 2019-09-16 MED ORDER — IOHEXOL 350 MG/ML SOLN
100.0000 mL | Freq: Once | INTRAVENOUS | Status: AC | PRN
  Administered 2019-09-16: 100 mL via INTRAVENOUS

## 2019-09-16 MED ORDER — LORAZEPAM 2 MG/ML IJ SOLN
1.0000 mg | Freq: Once | INTRAMUSCULAR | Status: AC
  Administered 2019-09-16: 16:00:00 1 mg via INTRAVENOUS
  Filled 2019-09-16: qty 1

## 2019-09-16 MED ORDER — ASPIRIN 325 MG PO TABS
325.0000 mg | ORAL_TABLET | Freq: Every day | ORAL | Status: DC
  Administered 2019-09-16 – 2019-09-17 (×2): 325 mg via ORAL
  Filled 2019-09-16 (×2): qty 1

## 2019-09-16 MED ORDER — DICYCLOMINE HCL 10 MG PO CAPS
10.0000 mg | ORAL_CAPSULE | Freq: Three times a day (TID) | ORAL | Status: DC | PRN
  Filled 2019-09-16: qty 1

## 2019-09-16 MED ORDER — MAGNESIUM CHLORIDE 64 MG PO TBEC
1.0000 | DELAYED_RELEASE_TABLET | Freq: Two times a day (BID) | ORAL | Status: DC
  Administered 2019-09-17 – 2019-09-19 (×6): 64 mg via ORAL
  Filled 2019-09-16 (×7): qty 1

## 2019-09-16 MED ORDER — ENOXAPARIN SODIUM 40 MG/0.4ML ~~LOC~~ SOLN
40.0000 mg | SUBCUTANEOUS | Status: DC
  Administered 2019-09-16 – 2019-09-17 (×2): 40 mg via SUBCUTANEOUS
  Filled 2019-09-16 (×2): qty 0.4

## 2019-09-16 MED ORDER — ATORVASTATIN CALCIUM 40 MG PO TABS
40.0000 mg | ORAL_TABLET | Freq: Every day | ORAL | Status: DC
  Administered 2019-09-16 – 2019-09-17 (×2): 40 mg via ORAL
  Filled 2019-09-16 (×2): qty 1

## 2019-09-16 MED ORDER — ASPIRIN 300 MG RE SUPP: 300.0000 mg | Freq: Every day | RECTAL | Status: DC

## 2019-09-16 MED ORDER — ACETAMINOPHEN 160 MG/5ML PO SOLN: 650.0000 mg | ORAL | Status: DC | PRN

## 2019-09-16 MED ORDER — STROKE: EARLY STAGES OF RECOVERY BOOK
Freq: Once | Status: AC
  Filled 2019-09-16: qty 1

## 2019-09-16 NOTE — H&P (Signed)
History and Physical    [name] [id number] DOB: [birth year] DOA: 09/16/2019  PCP: [name], PA-C Consultants:[facility] - GI; [place] - pulmonology; [place] - cardiology Patient coming from: Home - lives with nephew, niece visits regularly; [id number], Gaylan Gerold, [phone]   Chief Complaint: AMS  HPI: [name] is a 84 y.o. female with medical history significant of DM; OSA on BIPAP; HTN; HLD; COPD; RA on chronic immunosuppression; and chronic diastolic CHF presenting with AMS.  Her husband reported confusion for a few days and right-sided weakness today.  Her niece was thinking this might be related to her new medications (Bentyl).  The patient complains of pain all over her body.  She was last admitted from 1/29-2/5 with syncope; this was thought to be related to a vasovagal episode with dehydration.  She has had 5 admissions and 3 ER visits in the last 6 months.  She had an echo on 2/5 with normal EF and asymmetric basal septal hypertrophy - ? HOCM vs. Amyloidosis.   ED Course:  Probable CVA.  She has had AMS since last d/c but worse the last few days. Noted to have RUE paralysis.  CT ok, neuro is following and recommending MRI.  Review of Systems: As per HPI; otherwise review of systems reviewed and negative.   Ambulatory Status:  Ambulates without assistance  Past Medical History:  Diagnosis Date  . Allergy   . Anemia    "when I was young"  . Anginal pain ([place])   . Anxiety   . Arthritis    "all over my body"   . Chest pain 04/07/2014  . Chronic diastolic heart failure, NYHA class 1 ([place]) 01/22/2015  . Chronic lower back pain   . Chronic steroid use 12/11/2012  . COPD (chronic obstructive pulmonary disease) ([place]) 01/09/2017  . 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 Elevated lactic acid level 01/22/2015  . Essential hypertension 06/15/2018  . Frequent UTI    "recently" (06/14/2015)  . Gastric mass   . GERD  (gastroesophageal reflux disease)   . Headache(784.0)    "weekly; wake up w/them " (110/19/2018)  . Heart murmur   . High cholesterol   . History of blood transfusion ~ 1954   "related to my periods"  . Migraine    "once/month maybe" (05/16/2017)  . Multiple falls 06/14/2015  . Noncompliance 12/11/2012  . OSA (obstructive sleep apnea)    "I think I'm using a BiPAP" (06/14/2015)  . Sepsis ([place]) 01/09/2017  . Syncope 08/27/2019  . Syncope and collapse 12/11/2012  . Tachycardia with heart rate 100-120 beats per minute 04/08/2014  . Type II diabetes mellitus ([place])   . Uncontrolled type 2 diabetes mellitus with complication [facility])     Past Surgical History:  Procedure Laterality Date  . APPENDECTOMY  1970   Archie Endo 12/12/2010  . BACK SURGERY    . BIOPSY  05/25/2019   Procedure: BIOPSY;  Surgeon: Rush Landmark Telford Nab., MD;  Location: [facility] [place];  Service: Gastroenterology;;  . [place]   Archie Endo 12/12/2010  . ESOPHAGOGASTRODUODENOSCOPY N/A 05/25/2019   Procedure: ESOPHAGOGASTRODUODENOSCOPY (EGD);  Surgeon: Irving Copas., MD;  Location: [place];  Service: Gastroenterology;  Laterality: N/A;  . EUS N/A 10/16/2017   Procedure: UPPER ENDOSCOPIC ULTRASOUND (EUS) RADIAL;  Surgeon: Milus Banister, MD;  Location: [facility];  Service: Endoscopy;  Laterality: N/A;  . FRACTURE SURGERY    . LAPAROSCOPIC CHOLECYSTECTOMY  1990's  . LUMBAR MICRODISCECTOMY Left 09/2006  L5-S1/notes 12/12/2010  . MULTIPLE EXTRACTIONS WITH ALVEOLOPLASTY Bilateral 06/13/2017   Procedure: MULTIPLE EXTRACTION WITH ALVEOLOPLASTY;  Surgeon: Diona Browner, DDS;  Location: [place];  Service: Oral Surgery;  Laterality: Bilateral;  . MULTIPLE TOOTH EXTRACTIONS    . ORIF SHOULDER FRACTURE Left ~ 2009   "put cement down in it"   . TONSILLECTOMY  1950's?  . TOTAL ABDOMINAL HYSTERECTOMY  1979   Archie Endo 12/12/2010    Social History   Socioeconomic History  . Marital status: Widowed     Spouse name: Not on file  . Number of children: Not on file  . Years of education: Not on file  . Highest education level: Not on file  Occupational History  . Occupation: retired  Tobacco Use  . Smoking status: Former Smoker    Types: Cigarettes  . Smokeless tobacco: Never Used  . Tobacco comment: 05/16/2017"1 pack of cigarettes would last 3-4 months; haven't had any cigarettes for 40 years"  Substance and Sexual Activity  . Alcohol use: Not Currently    Comment: 05/16/2017 "used to drink beer in the 1990's; don't drink at all now"  . Drug use: No  . Sexual activity: Not Currently    Birth control/protection: Abstinence  Other Topics Concern  . Not on file  Social History Narrative   Lives at home alone    Her niece comes to check on her everyday. Her son comes by every other day as well   Right handed   Caffeine: none   Social Determinants of Health   Financial Resource Strain:   . Difficulty of Paying Living Expenses: Not on file  Food Insecurity:   . Worried About Charity fundraiser in the Last Year: Not on file  . Ran Out of Food in the Last Year: Not on file  Transportation Needs:   . Lack of Transportation (Medical): Not on file  . Lack of Transportation (Non-Medical): Not on file  Physical Activity:   . Days of Exercise per Week: Not on file  . Minutes of Exercise per Session: Not on file  Stress:   . Feeling of Stress : Not on file  Social Connections:   . Frequency of Communication with Friends and Family: Not on file  . Frequency of Social Gatherings with Friends and Family: Not on file  . Attends Religious Services: Not on file  . Active Member of Clubs or Organizations: Not on file  . Attends Archivist Meetings: Not on file  . Marital Status: Not on file  Intimate Partner Violence:   . Fear of Current or Ex-Partner: Not on file  . Emotionally Abused: Not on file  . Physically Abused: Not on file  . Sexually Abused: Not on file     Allergies  Allergen Reactions  . Lactose Intolerance ([place])     Family History  Problem Relation Age of Onset  . Hypertension Mother   . Heart attack Father   . Diabetes Mellitus II Sister   . Hypertension Sister   . Cancer - Other Sister   . Migraines Niece   . Colon cancer Neg Hx     Prior to Admission medications   Medication Sig Start Date End Date Taking? Authorizing Provider  albuterol (ACCUNEB) 0.63 MG/3ML nebulizer solution Take 2 ampules by nebulization every 6 (six) hours as needed for wheezing.    [provider]  aspirin EC 81 MG tablet Take 1 tablet (81 mg total) by mouth every Monday, Wednesday, and Friday.  08/04/19   Geradine Girt, DO  atorvastatin (LIPITOR) 10 MG tablet Take 1 Tablet by mouth daily Patient taking differently: Take 10 mg by mouth daily at 6 PM.  07/07/19   Rodriguez-Southworth, Sunday Spillers, PA-C  carvedilol (COREG) 6.25 MG tablet Take 1 tablet (6.25 mg total) by mouth 2 (two) times daily with a meal. 09/02/19 11/01/19  Antonieta Pert, MD  dicyclomine (BENTYL) 10 MG capsule Take 1 capsule (10 mg total) by mouth every 8 (eight) hours as needed for spasms. 08/23/19   [facility]bruster, Carlota Raspberry, MD  docusate sodium (COLACE) 100 MG capsule Take 1 capsule (100 mg total) by mouth 2 (two) times daily. 08/03/19   Geradine Girt, DO  glucose blood (ONETOUCH VERIO) test strip Use to check blood sugar 3 to 4 times a day before meals 03/25/19   Rodriguez-Southworth, Sunday Spillers, PA-C  hydrOXYzine (ATARAX/VISTARIL) 10 MG tablet 1 q 8h prn itching Patient taking differently: Take 10 mg by mouth every 8 (eight) hours as needed for itching.  04/22/19   Rodriguez-Southworth, Sunday Spillers, PA-C  magnesium chloride (SLOW-MAG) 64 MG TBEC SR tablet Take 1 tablet (64 mg total) by mouth 2 (two) times daily. 08/13/19   Quintella Reichert, MD  methylPREDNISolone (MEDROL) 4 MG tablet Take 4 mg by mouth daily.    Rosita Kea, PA-C  metoCLOPramide (REGLAN) 5 MG tablet Take 1 tablet (5 mg total) by mouth 3  (three) times daily before meals. 08/23/19   Armbruster, Carlota Raspberry, MD  mometasone-formoterol (DULERA) 200-5 MCG/ACT AERO Inhale 2 puffs into the lungs 2 (two) times daily.    [provider]  ondansetron (ZOFRAN ODT) 4 MG disintegrating tablet Take 1 tablet (4 mg total) by mouth every 8 (eight) hours as needed for up to 30 doses for nausea or vomiting. 09/02/19   Antonieta Pert, MD  pantoprazole (PROTONIX) 40 MG tablet Take 1 tablet (40 mg total) by mouth daily for 30 doses. 09/02/19 10/02/19  Antonieta Pert, MD  polyethylene glycol (MIRALAX) 17 g packet Take 17 g by mouth daily. 08/23/19   [facility]uster, Carlota Raspberry, MD  Potassium Chloride 40 MEQ/15ML (20%) SOLN Take 20 mEq by mouth daily for 10 days. 08/13/19 08/23/19  Fatima Blank, MD  simethicone (GAS-X EXTRA STRENGTH) 125 MG chewable tablet Chew 125 mg by mouth every 6 (six) hours as needed for flatulence.    [provider]  traMADol (ULTRAM) 50 MG tablet Take 1 tablet (50 mg total) by mouth every 6 (six) hours as needed. Patient not taking: Reported on 08/23/2019 07/15/19   [name], [place]    Physical Exam: Vitals:   09/16/19 1430 09/16/19 1445 09/16/19 1702 09/16/19 1703  BP:  129/68 115/83   Pulse: 96   (!) 106  Resp: (!) 24 (!) 26    Temp:      TempSrc:      SpO2: 97%   96%     . General:  Appears calm and comfortable and is NAD . Eyes:  PERRL, EOMI, normal lids, iris . ENT:  grossly normal hearing, lips & tongue, mmm . Neck:  no LAD, masses or thyromegaly; no carotid bruits . Cardiovascular:  RRR, no m/r/g. No LE edema.  Mar[place] Kitchen Respiratory:   CTA bilaterally with no wheezes/rales/rhonchi.  Normal respiratory effort. . Abdomen:  soft, NT, ND, NABS . Skin:  no rash or induration seen on limited exam . Musculoskeletal:  4-5/5 strength of the RUE > RLE, good ROM, no bony abnormality . Psychiatric:  blunted mood and  affect, speech fluent and appropriate, AOx3 . Neurologic:  CN 2-12 grossly intact, moves all  extremities in coordinated fashion    Radiological Exams on Admission: CT Angio Head W or Wo Contrast  Result Date: 09/16/2019 CLINICAL DATA:  Right arm weakness.  Stroke code. EXAM: CT ANGIOGRAPHY HEAD AND NECK CT PERFUSION BRAIN TECHNIQUE: Multidetector CT imaging of the head and neck was performed using the standard protocol during bolus administration of intravenous contrast. Multiplanar CT image reconstructions and MIPs were obtained to evaluate the vascular anatomy. Carotid stenosis measurements (when applicable) are obtained utilizing NASCET criteria, using the distal internal carotid diameter as the denominator. Multiphase CT imaging of the brain was performed following IV bolus contrast injection. Subsequent parametric perfusion maps were calculated using RAPID software. CONTRAST:  168mL OMNIPAQUE IOHEXOL 350 MG/ML SOLN COMPARISON:  Noncontrast head CT performed earlier the same day. FINDINGS: CTA NECK FINDINGS Aortic arch: Common origin of the innominate and left common carotid arteries. No significant atherosclerotic disease within the visualized aortic arch. No significant innominate or proximal subclavian artery stenosis. Right carotid system: CCA and ICA patent within the neck. Calcified plaque within the proximal ICA results in less than 50% stenosis. Left carotid system: CCA and ICA patent within the neck. Calcified plaque within the proximal ICA results in less than 50% stenosis. Vertebral arteries: The right vertebral artery is slightly dominant. The vertebral arteries are patent within the neck bilaterally without stenosis. Skeleton: No acute bony abnormality. Cervical spondylosis with multilevel posterior disc osteophytes, uncovertebral and facet hypertrophy. Multilevel degenerative fusion within the cervical and upper thoracic spine related to prominent ventrolateral bridging osteophytes. Other neck: No soft tissue neck mass or cervical lymphadenopathy. Thyroid unremarkable. Upper chest:  There is no consolidation within the imaged lung apices. Review of the MIP images confirms the above findings CTA HEAD FINDINGS Anterior circulation: The intracranial internal carotid arteries are patent bilaterally. Mild calcified plaque within the cavernous segments. Mild stenosis within the left cavernous ICA. The M1 middle cerebral arteries are patent bilaterally without significant stenosis. No M2 proximal branch occlusion or high-grade proximal stenosis is identified. The anterior cerebral arteries are patent bilaterally without high-grade proximal stenosis. The A1 right ACA is hypoplastic. No intracranial aneurysm is identified. Posterior circulation: The intracranial vertebral arteries are patent bilaterally. Calcified plaque within the intracranial right vertebral artery with focal mild/moderate stenosis. The basilar artery is patent without significant stenosis. Fetal origin of the right posterior cerebral artery. A small posterior communicating arteries present on the left. The posterior cerebral arteries are patent bilaterally. Moderate to moderately advanced segmental stenosis within the P2 right posterior cerebral artery (series 11, image 21) Venous sinuses: Within limitations of contrast timing, no convincing thrombus. Anatomic variants: As described. Review of the MIP images confirms the above findings CT Brain Perfusion Findings: CBF (<30%) Volume: 9mL Perfusion (Tmax>6.0s) volume: 34mL Mismatch Volume: 50mL Infarction Location:None identified IMPRESSION: CTA neck: 1. The common and internal carotid arteries are patent within the neck. Calcified plaque within the proximal ICAs results in less than 50% stenosis bilaterally. 2. The vertebral arteries are patent within the neck without stenosis. 3. Cervical spondylosis with multilevel posterior disc osteophytes, uncovertebral and facet hypertrophy. There is also multilevel degenerative fusion within the cervical and upper thoracic spine related to  bridging ventral osteophytes. CTA head: 1. No intracranial large vessel occlusion. 2. Mild calcified plaque within the intracranial internal carotid arteries. Mild stenosis of the cavernous left ICA. 3. Calcified plaque results in focal mild/moderate stenosis within the V4 right  vertebral artery. 4. Moderate segmental stenosis within the P2 right posterior cerebral artery. CT perfusion head: The CT perfusion software identifies no core infarct. The CT perfusion software identifies no region of critically hypoperfused parenchyma utilizing a Tmax>6 seconds threshold. Electronically Signed   By: Kellie Simmering DO   On: 09/16/2019 13:49   CT Angio Neck W and/or Wo Contrast  Result Date: 09/16/2019 CLINICAL DATA:  Right arm weakness.  Stroke code. EXAM: CT ANGIOGRAPHY HEAD AND NECK CT PERFUSION BRAIN TECHNIQUE: Multidetector CT imaging of the head and neck was performed using the standard protocol during bolus administration of intravenous contrast. Multiplanar CT image reconstructions and MIPs were obtained to evaluate the vascular anatomy. Carotid stenosis measurements (when applicable) are obtained utilizing NASCET criteria, using the distal internal carotid diameter as the denominator. Multiphase CT imaging of the brain was performed following IV bolus contrast injection. Subsequent parametric perfusion maps were calculated using RAPID software. CONTRAST:  112mL OMNIPAQUE IOHEXOL 350 MG/ML SOLN COMPARISON:  Noncontrast head CT performed earlier the same day. FINDINGS: CTA NECK FINDINGS Aortic arch: Common origin of the innominate and left common carotid arteries. No significant atherosclerotic disease within the visualized aortic arch. No significant innominate or proximal subclavian artery stenosis. Right carotid system: CCA and ICA patent within the neck. Calcified plaque within the proximal ICA results in less than 50% stenosis. Left carotid system: CCA and ICA patent within the neck. Calcified plaque within the  proximal ICA results in less than 50% stenosis. Vertebral arteries: The right vertebral artery is slightly dominant. The vertebral arteries are patent within the neck bilaterally without stenosis. Skeleton: No acute bony abnormality. Cervical spondylosis with multilevel posterior disc osteophytes, uncovertebral and facet hypertrophy. Multilevel degenerative fusion within the cervical and upper thoracic spine related to prominent ventrolateral bridging osteophytes. Other neck: No soft tissue neck mass or cervical lymphadenopathy. Thyroid unremarkable. Upper chest: There is no consolidation within the imaged lung apices. Review of the MIP images confirms the above findings CTA HEAD FINDINGS Anterior circulation: The intracranial internal carotid arteries are patent bilaterally. Mild calcified plaque within the cavernous segments. Mild stenosis within the left cavernous ICA. The M1 middle cerebral arteries are patent bilaterally without significant stenosis. No M2 proximal branch occlusion or high-grade proximal stenosis is identified. The anterior cerebral arteries are patent bilaterally without high-grade proximal stenosis. The A1 right ACA is hypoplastic. No intracranial aneurysm is identified. Posterior circulation: The intracranial vertebral arteries are patent bilaterally. Calcified plaque within the intracranial right vertebral artery with focal mild/moderate stenosis. The basilar artery is patent without significant stenosis. Fetal origin of the right posterior cerebral artery. A small posterior communicating arteries present on the left. The posterior cerebral arteries are patent bilaterally. Moderate to moderately advanced segmental stenosis within the P2 right posterior cerebral artery (series 11, image 21) Venous sinuses: Within limitations of contrast timing, no convincing thrombus. Anatomic variants: As described. Review of the MIP images confirms the above findings CT Brain Perfusion Findings: CBF (<30%)  Volume: 77mL Perfusion (Tmax>6.0s) volume: 76mL Mismatch Volume: 63mL Infarction Location:None identified IMPRESSION: CTA neck: 1. The common and internal carotid arteries are patent within the neck. Calcified plaque within the proximal ICAs results in less than 50% stenosis bilaterally. 2. The vertebral arteries are patent within the neck without stenosis. 3. Cervical spondylosis with multilevel posterior disc osteophytes, uncovertebral and facet hypertrophy. There is also multilevel degenerative fusion within the cervical and upper thoracic spine related to bridging ventral osteophytes. CTA head: 1. No intracranial large vessel occlusion.  2. Mild calcified plaque within the intracranial internal carotid arteries. Mild stenosis of the cavernous left ICA. 3. Calcified plaque results in focal mild/moderate stenosis within the V4 right vertebral artery. 4. Moderate segmental stenosis within the P2 right posterior cerebral artery. CT perfusion head: The CT perfusion software identifies no core infarct. The CT perfusion software identifies no region of critically hypoperfused parenchyma utilizing a Tmax>6 seconds threshold. Electronically Signed   By: Kellie Simmering DO   On: 09/16/2019 13:49   MR BRAIN WO CONTRAST  Result Date: 09/16/2019 CLINICAL DATA:  Follow-up stroke.  Right arm weakness. EXAM: MRI HEAD WITHOUT CONTRAST TECHNIQUE: Multiplanar, multiecho pulse sequences of the brain and surrounding structures were obtained without intravenous contrast. COMPARISON:  CT studies earlier same day FINDINGS: The study suffers from marked motion degradation. Brain: Small (1-2 cm) region of early acute infarction in the left posterior frontal region. No other acute finding. No abnormality seen affecting the brainstem or cerebellum. Mild chronic small-vessel changes of the hemispheric white matter considering age. No hydrocephalus or extra-axial collection. No sign of hemorrhage. Vascular: Major vessels at the base of the  brain show flow. Skull and upper cervical spine: Negative Sinuses/Orbits: Clear/normal Other: None IMPRESSION: 1-2 cm region of early acute infarction in the left posterior frontal brain. No mass effect or hemorrhage evident. Electronically Signed   By: Nelson Chimes M.D.   On: 09/16/2019 16:27   CT CEREBRAL PERFUSION W CONTRAST  Result Date: 09/16/2019 CLINICAL DATA:  Right arm weakness.  Stroke code. EXAM: CT ANGIOGRAPHY HEAD AND NECK CT PERFUSION BRAIN TECHNIQUE: Multidetector CT imaging of the head and neck was performed using the standard protocol during bolus administration of intravenous contrast. Multiplanar CT image reconstructions and MIPs were obtained to evaluate the vascular anatomy. Carotid stenosis measurements (when applicable) are obtained utilizing NASCET criteria, using the distal internal carotid diameter as the denominator. Multiphase CT imaging of the brain was performed following IV bolus contrast injection. Subsequent parametric perfusion maps were calculated using RAPID software. CONTRAST:  176mL OMNIPAQUE IOHEXOL 350 MG/ML SOLN COMPARISON:  Noncontrast head CT performed earlier the same day. FINDINGS: CTA NECK FINDINGS Aortic arch: Common origin of the innominate and left common carotid arteries. No significant atherosclerotic disease within the visualized aortic arch. No significant innominate or proximal subclavian artery stenosis. Right carotid system: CCA and ICA patent within the neck. Calcified plaque within the proximal ICA results in less than 50% stenosis. Left carotid system: CCA and ICA patent within the neck. Calcified plaque within the proximal ICA results in less than 50% stenosis. Vertebral arteries: The right vertebral artery is slightly dominant. The vertebral arteries are patent within the neck bilaterally without stenosis. Skeleton: No acute bony abnormality. Cervical spondylosis with multilevel posterior disc osteophytes, uncovertebral and facet hypertrophy.  Multilevel degenerative fusion within the cervical and upper thoracic spine related to prominent ventrolateral bridging osteophytes. Other neck: No soft tissue neck mass or cervical lymphadenopathy. Thyroid unremarkable. Upper chest: There is no consolidation within the imaged lung apices. Review of the MIP images confirms the above findings CTA HEAD FINDINGS Anterior circulation: The intracranial internal carotid arteries are patent bilaterally. Mild calcified plaque within the cavernous segments. Mild stenosis within the left cavernous ICA. The M1 middle cerebral arteries are patent bilaterally without significant stenosis. No M2 proximal branch occlusion or high-grade proximal stenosis is identified. The anterior cerebral arteries are patent bilaterally without high-grade proximal stenosis. The A1 right ACA is hypoplastic. No intracranial aneurysm is identified. Posterior circulation: The intracranial  vertebral arteries are patent bilaterally. Calcified plaque within the intracranial right vertebral artery with focal mild/moderate stenosis. The basilar artery is patent without significant stenosis. Fetal origin of the right posterior cerebral artery. A small posterior communicating arteries present on the left. The posterior cerebral arteries are patent bilaterally. Moderate to moderately advanced segmental stenosis within the P2 right posterior cerebral artery (series 11, image 21) Venous sinuses: Within limitations of contrast timing, no convincing thrombus. Anatomic variants: As described. Review of the MIP images confirms the above findings CT Brain Perfusion Findings: CBF (<30%) Volume: 26mL Perfusion (Tmax>6.0s) volume: 55mL Mismatch Volume: 70mL Infarction Location:None identified IMPRESSION: CTA neck: 1. The common and internal carotid arteries are patent within the neck. Calcified plaque within the proximal ICAs results in less than 50% stenosis bilaterally. 2. The vertebral arteries are patent within the  neck without stenosis. 3. Cervical spondylosis with multilevel posterior disc osteophytes, uncovertebral and facet hypertrophy. There is also multilevel degenerative fusion within the cervical and upper thoracic spine related to bridging ventral osteophytes. CTA head: 1. No intracranial large vessel occlusion. 2. Mild calcified plaque within the intracranial internal carotid arteries. Mild stenosis of the cavernous left ICA. 3. Calcified plaque results in focal mild/moderate stenosis within the V4 right vertebral artery. 4. Moderate segmental stenosis within the P2 right posterior cerebral artery. CT perfusion head: The CT perfusion software identifies no core infarct. The CT perfusion software identifies no region of critically hypoperfused parenchyma utilizing a Tmax>6 seconds threshold. Electronically Signed   By: Kellie Simmering DO   On: 09/16/2019 13:49   DG Chest Port 1 View  Result Date: 09/16/2019 CLINICAL DATA:  Shortness of breath.  Chest pain. EXAM: PORTABLE CHEST 1 VIEW COMPARISON:  None. FINDINGS: Cardiomegaly. The hila and mediastinum are normal. No pneumothorax. No nodules or masses. No focal infiltrates. IMPRESSION: No active disease. Electronically Signed   By: Dorise Bullion III M.D   On: 09/16/2019 14:24   CT HEAD CODE STROKE WO CONTRAST  Result Date: 09/16/2019 CLINICAL DATA:  Code stroke. EXAM: CT HEAD WITHOUT CONTRAST TECHNIQUE: Contiguous axial images were obtained from the base of the skull through the vertex without intravenous contrast. COMPARISON:  None. FINDINGS: Brain: No evidence of acute infarction, hemorrhage, hydrocephalus, extra-axial collection or mass lesion/mass effect. Hypodensity of the periventricular white matter likely related to chronic small vessel ischemia. Vascular: No hyperdense vessel or unexpected calcification. Skull: Normal. Negative for fracture or focal lesion. Sinuses/Orbits: No acute finding. Other: None. ASPECTS [facility] Stroke Program Early CT Score) -  Ganglionic level infarction (caudate, lentiform nuclei, internal capsule, insula, M1-M3 cortex): 7/7 - Supraganglionic infarction (M4-M6 cortex): 3/3 Total score (0-10 with 10 being normal): 10 IMPRESSION: No acute intracranial findings. Hypodensity of the periventricular white matter likely related to chronic small vessel ischemia. *ASPECTS is 10 *Results communicated to Dr. Leonel Ramsay via pager system. Electronically Signed   By: Pedro Earls M.D.   On: 09/16/2019 13:07    EKG: Independently reviewed.  NSR with rate 99; no evidence of acute ischemia   Labs on Admission: I have personally reviewed the available labs and imaging studies at the time of the admission.  Pertinent labs:   Na++ 133 K+ 3.4 Glucose 133 Albumin 2.9 AST 46/ALT 19/WBC 6.2 Hgb 11.0 INR 1.0 ETOH <10 Respiratory panel PCR negative   Assessment/Plan Principal Problem:   CVA (cerebral vascular accident) ([place]) Active Problems:   Rheumatoid arthritis ([place])   COPD (chronic obstructive pulmonary disease) ([place])   Uncontrolled type 2  diabetes mellitus with complication (HCC)   Chronic diastolic CHF (congestive heart failure) (HCC)   Essential hypertension   CVA -The patient presented with several days of AMS and acute right-sided weakness today -Symptoms concerning for CVA, and MRI confirms an early acute 1-2cm in the left posterior frontal brain -Will admit for further CVA evaluation -Telemetry monitoring -Echo performed recently and does not appear to need to be repeated; she should have consideration of outpatient cardiac MRI for HOCM vs. Amyloidosis, but this is not her most pressing issue -Risk stratification with FLP, A1c; will also check UDS -ASA daily (has been taking MWF but will increase to full strength for now and give daily) -Neurology consult -PT/OT/ST/Nutrition Consults -TOC team consult for placement if needed  HTN -Allow permissive HTN for now -Treat BP only if >220/120, and  then with goal of 15% reduction -Hold Coreg and plan to restart in 48-72 hours  HLD -Check FLP -Continue Lipitor but will increase to 40 mg daily  DM -Prior A1c was 7.9 in 10/20 - but she has had weight loss since then -She is not taking medication for DM -Will cover with moderate-scale SSI as needed   Gastroparesis, chronic N/V -Patient with multiple admissions for gastroparesis; prior EGD with biopsies showed chronic gastritis -She was started on Reglan; Protonix; Miralax and Colace; she was recently started on Bentyl  RA, on immunosuppression therapy -She was previously on Plaquenil and daily methylprednisolone but it appears that these medications have been stopped  COPD -Continue prn Albuterol, Symbicort  Chronic diastolic CHF -Appears to be compensated at this time   Note: This patient has been tested and is negative for the novel coronavirus COVID-19.   DVT prophylaxis:  Lovenox  Code Status: DNR - confirmed with patient Family Communication: None present; I spoke with her niece at the time of the admission Disposition Plan:  Home once clinically improved Consults called: Neurology; PT/OT/ST/Nutrition  Admission status: Admit - It is my clinical opinion that admission to INPATIENT is reasonable and necessary because of the expectation that this patient will require hospital care that crosses at least 2 midnights to treat this condition based on the medical complexity of the problems presented.  Given the aforementioned information, the predictability of an adverse outcome is felt to be significant.   Karmen Bongo MD [facility]   How to contact the [facility] Attending or Consulting provider [place] or covering provider during after hours [place], for this patient?  1. Check the care team in [facility] and look for a) attending/consulting TRH provider listed and b) the [facility] team listed 2. Log into [email or web address] and use [place]'s universal password to access. If you do  not have the password, please contact the hospital operator. 3. Locate the [facility] provider you are looking for under [facility] and page to a number that you can be directly reached. 4. If you still have difficulty reaching the provider, please page the [facility] (Director on Call) for the Hospitalists listed on amion for assistance.   09/16/2019, 5:09 PM

## 2019-09-16 NOTE — ED Notes (Signed)
Activated code stroke 

## 2019-09-16 NOTE — ED Notes (Signed)
[place]- husband- [phone]- would like pt updates.

## 2019-09-16 NOTE — ED Provider Notes (Signed)
[facility] Emergency Department Provider Note MRN:  [id number]  Arrival date & time: 09/16/19     Chief Complaint   Altered mental status History of Present Illness   [name] is a 84 y.o. year-old female with a history of CHF, COPD, diabetes presenting to the ED with chief complaint of altered mental status.  Report of not acting normally and shortness of breath shortly prior to arrival, EMS arrived and found patient to have significantly decreased strength to right arm.  Patient is intermittently confused.  Review of Systems  Positive for altered mental status, shortness of breath, right arm weakness.  Patient's Health History    Past Medical History:  Diagnosis Date  . Allergy   . Anemia    "when I was young"  . Anginal pain ([place])   . Anxiety   . Arthritis    "all over my body"   . Chest pain 04/07/2014  . Chronic diastolic heart failure, NYHA class 1 ([place]) 01/22/2015  . Chronic lower back pain   . Chronic steroid use 12/11/2012  . COPD (chronic obstructive pulmonary disease) ([place]) 01/09/2017  . 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 Elevated lactic acid level 01/22/2015  . Essential hypertension 06/15/2018  . Frequent UTI    "recently" (06/14/2015)  . Gastric mass   . GERD (gastroesophageal reflux disease)   . Headache(784.0)    "weekly; wake up w/them " (110/19/2018)  . Heart murmur   . High cholesterol   . History of blood transfusion ~ 1954   "related to my periods"  . Migraine    "once/month maybe" (05/16/2017)  . Multiple falls 06/14/2015  . Noncompliance 12/11/2012  . OSA (obstructive sleep apnea)    "I think I'm using a BiPAP" (06/14/2015)  . Sepsis ([place]) 01/09/2017  . Syncope 08/27/2019  . Syncope and collapse 12/11/2012  . Tachycardia with heart rate 100-120 beats per minute 04/08/2014  . Type II diabetes mellitus ([place])   . Uncontrolled type 2 diabetes mellitus with complication [facility])      Past Surgical History:  Procedure Laterality Date  . APPENDECTOMY  1970   Archie Endo 12/12/2010  . BACK SURGERY    . BIOPSY  05/25/2019   Procedure: BIOPSY;  Surgeon: Rush Landmark Telford Nab., MD;  Location: [place];  Service: Gastroenterology;;  . [place]   Archie Endo 12/12/2010  . ESOPHAGOGASTRODUODENOSCOPY N/A 05/25/2019   Procedure: ESOPHAGOGASTRODUODENOSCOPY (EGD);  Surgeon: Irving Copas., MD;  Location: [place];  Service: Gastroenterology;  Laterality: N/A;  . EUS N/A 10/16/2017   Procedure: UPPER ENDOSCOPIC ULTRASOUND (EUS) RADIAL;  Surgeon: Milus Banister, MD;  Location: [facility];  Service: Endoscopy;  Laterality: N/A;  . FRACTURE SURGERY    . LAPAROSCOPIC CHOLECYSTECTOMY  1990's  . LUMBAR MICRODISCECTOMY Left 09/2006   L5-S1/notes 12/12/2010  . MULTIPLE EXTRACTIONS WITH ALVEOLOPLASTY Bilateral 06/13/2017   Procedure: MULTIPLE EXTRACTION WITH ALVEOLOPLASTY;  Surgeon: Diona Browner, DDS;  Location: [place];  Service: Oral Surgery;  Laterality: Bilateral;  . MULTIPLE TOOTH EXTRACTIONS    . ORIF SHOULDER FRACTURE Left ~ 2009   "put cement down in it"   . TONSILLECTOMY  1950's?  . TOTAL ABDOMINAL HYSTERECTOMY  1979   Archie Endo 12/12/2010    Family History  Problem Relation Age of Onset  . Hypertension Mother   . Heart attack Father   . Diabetes Mellitus II Sister   . Hypertension Sister   . Cancer - Other Sister   .  Migraines Niece   . Colon cancer Neg Hx     Social History   Socioeconomic History  . Marital status: Widowed    Spouse name: Not on file  . Number of children: Not on file  . Years of education: Not on file  . Highest education level: Not on file  Occupational History  . Occupation: retired  Tobacco Use  . Smoking status: Former Smoker    Types: Cigarettes  . Smokeless tobacco: Never Used  . Tobacco comment: 05/16/2017"1 pack of cigarettes would last 3-4 months; haven't had any cigarettes for 40 years"  Substance and  Sexual Activity  . Alcohol use: Not Currently    Comment: 05/16/2017 "used to drink beer in the 1990's; don't drink at all now"  . Drug use: No  . Sexual activity: Not Currently    Birth control/protection: Abstinence  Other Topics Concern  . Not on file  Social History Narrative   Lives at home alone    Her niece comes to check on her everyday. Her son comes by every other day as well   Right handed   Caffeine: none   Social Determinants of Health   Financial Resource Strain:   . Difficulty of Paying Living Expenses: Not on file  Food Insecurity:   . Worried About Charity fundraiser in the Last Year: Not on file  . Ran Out of Food in the Last Year: Not on file  Transportation Needs:   . Lack of Transportation (Medical): Not on file  . Lack of Transportation (Non-Medical): Not on file  Physical Activity:   . Days of Exercise per Week: Not on file  . Minutes of Exercise per Session: Not on file  Stress:   . Feeling of Stress : Not on file  Social Connections:   . Frequency of Communication with Friends and Family: Not on file  . Frequency of Social Gatherings with Friends and Family: Not on file  . Attends Religious Services: Not on file  . Active Member of Clubs or Organizations: Not on file  . Attends Archivist Meetings: Not on file  . Marital Status: Not on file  Intimate Partner Violence:   . Fear of Current or Ex-Partner: Not on file  . Emotionally Abused: Not on file  . Physically Abused: Not on file  . Sexually Abused: Not on file     Physical Exam   Vitals:   09/16/19 1430 09/16/19 1445  BP:  129/68  Pulse: 96   Resp: (!) 24 (!) 26  Temp:    SpO2: 97%     CONSTITUTIONAL: Well-appearing, NAD NEURO: Alert and oriented to name and place, no aphasia, no neglect, normal strength and sensation to the legs but profound weakness to the right arm. EYES:  eyes equal and reactive ENT/NECK:  no LAD, no JVD CARDIO: Regular rate, well-perfused, normal S1  and S2 PULM:  CTAB no wheezing or rhonchi GI/GU:  normal bowel sounds, non-distended, non-tender MSK/SPINE:  No gross deformities, no edema SKIN:  no rash, atraumatic PSYCH:  Appropriate speech and behavior  *Additional and/or pertinent findings included in MDM below  Diagnostic and Interventional Summary    EKG Interpretation  Date/Time:  Thursday September 16 2019 13:31:33 EST Ventricular Rate:  99 PR Interval:    QRS Duration: 82 QT Interval:  380 QTC Calculation: 488 R Axis:   -35 Text Interpretation: Sinus rhythm Left axis deviation Borderline prolonged QT interval Confirmed by Gerlene Fee [id number]) on 09/16/2019  1:35:45 PM      Cardiac Monitoring Interpretation:  Labs Reviewed  CBC - Abnormal; Notable for the following components:      Result Value   Hemoglobin 11.0 (*)    HCT 35.7 (*)    All other components within normal limits  COMPREHENSIVE METABOLIC PANEL - Abnormal; Notable for the following components:   Sodium 133 (*)    Potassium 3.4 (*)    Glucose, Bld 133 (*)    Calcium 8.7 (*)    Total Protein 6.2 (*)    Albumin 2.9 (*)    AST 46 (*)    All other components within normal limits  CBG MONITORING, ED - Abnormal; Notable for the following components:   Glucose-Capillary 127 (*)    All other components within normal limits  I-STAT CHEM 8, ED - Abnormal; Notable for the following components:   Potassium 3.3 (*)    Glucose, Bld 125 (*)    All other components within normal limits  RESPIRATORY PANEL BY RT PCR (FLU A&B, COVID)  ETHANOL  PROTIME-INR  APTT  DIFFERENTIAL  RAPID URINE DRUG SCREEN, HOSP PERFORMED  URINALYSIS, ROUTINE W REFLEX MICROSCOPIC    DG Chest Port 1 View  Final Result    CT Angio Head W or Wo Contrast  Final Result    CT Angio Neck W and/or Wo Contrast  Final Result    CT CEREBRAL PERFUSION W CONTRAST  Final Result    CT HEAD CODE STROKE WO CONTRAST  Final Result    MR BRAIN WO CONTRAST    (Results Pending)     Medications  iohexol (OMNIPAQUE) 350 MG/ML injection 100 mL (100 mLs Intravenous Contrast Given 09/16/19 1328)     Procedures  /  Critical Care .Critical Care Performed by: Maudie Flakes, MD Authorized by: Maudie Flakes, MD   Critical care provider statement:    Critical care time (minutes):  34   Critical care was necessary to treat or prevent imminent or life-threatening deterioration of the following conditions:  CNS failure or compromise (Concern for acute ischemic stroke)   Critical care was time spent personally by me on the following activities:  Discussions with consultants, evaluation of patient's response to treatment, examination of patient, ordering and performing treatments and interventions, ordering and review of laboratory studies, ordering and review of radiographic studies, pulse oximetry, re-evaluation of patient's condition, obtaining history from patient or surrogate and review of old charts    ED Course and Medical Decision Making  I have reviewed the triage vital signs, the nursing notes, and pertinent available records from the EMR.  Pertinent labs & imaging results that were available during my care of the patient were reviewed by me and considered in my medical decision making (see below for details).     Unclear last known normal but right arm weakness seems to have occurred fairly recently with altered mental status, code stroke initiated, awaiting CT imaging.  3 PM update: CT imaging is without acute process, awaiting MRI.  Evaluated by Dr. Leonel Ramsay of neurology, given the altered mental status and new right-sided weakness will need hospitalist admission.  Barth Kirks. Sedonia Small, MD [place] [email or web address]  Final Clinical Impressions(s) / ED Diagnoses     ICD-10-CM   1. Acute ischemic stroke [facility])  I63.9     ED Discharge Orders    None       Discharge Instructions Discussed with and Provided  to Patient:  Discharge Instructions   None       Maudie Flakes, MD 09/16/19 [id number]

## 2019-09-16 NOTE — ED Triage Notes (Signed)
Pt arrives via ems with reports of AMS since discharge, worsening today per [facility]. EMS noted pt to have R arm weakness. Code stroke activated on arrival to the ED. Pt with some confusion. VSS.

## 2019-09-16 NOTE — ED Notes (Signed)
Patient spouse [name] calling stating he's been waiting a few hours for a call back and has yet to hear anything. Please call  [phone]

## 2019-09-16 NOTE — Consult Note (Signed)
Neurology Consultation Reason for Consult: Right-sided weakness Referring Physician: Viona Gilmore  CC: Right-sided weakness  History is obtained from: Patient's daughter and husband  HPI: [name] is a 84 y.o. female with a history of rheumatoid arthritis, gastroparesis, with a history of rheumatoid arthritis, gastroparesis, COPD who was recently admitted with a syncopal episode felt to be due to vasovagal and dehydration.  She has apparently been confused ever since discharge from the hospital.  Her husband states that she often states "I do not know" when he asked her questions which is atypical for her.  Today, he noticed that her right arm was weak as well.  He has not noticed that before today.  Home health nurse went out to check on her today and noticed that she was not her normal self and was confused.  When she noticed this coupled with the right-sided weakness EMS was called.  LKW: Unclear tpa given?: no, unclear time of onset    ROS: A 14 point ROS was performed and is negative except as noted in the HPI.   Past Medical History:  Diagnosis Date  . Allergy   . Anemia    "when I was young"  . Anginal pain ([place])   . Anxiety   . Arthritis    "all over my body"   . Chest pain 04/07/2014  . Chronic diastolic heart failure, NYHA class 1 ([place]) 01/22/2015  . Chronic lower back pain   . Chronic steroid use 12/11/2012  . COPD (chronic obstructive pulmonary disease) ([place]) 01/09/2017  . 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 Elevated lactic acid level 01/22/2015  . Essential hypertension 06/15/2018  . Frequent UTI    "recently" (06/14/2015)  . Gastric mass   . GERD (gastroesophageal reflux disease)   . Headache(784.0)    "weekly; wake up w/them " (110/19/2018)  . Heart murmur   . High cholesterol   . History of blood transfusion ~ 1954   "related to my periods"  . Migraine    "once/month maybe" (05/16/2017)  . Multiple falls 06/14/2015  .  Noncompliance 12/11/2012  . OSA (obstructive sleep apnea)    "I think I'm using a BiPAP" (06/14/2015)  . Sepsis ([place]) 01/09/2017  . Syncope 08/27/2019  . Syncope and collapse 12/11/2012  . Tachycardia with heart rate 100-120 beats per minute 04/08/2014  . Type II diabetes mellitus ([place])   . Uncontrolled type 2 diabetes mellitus with complication (HCC)      Family History  Problem Relation Age of Onset  . Hypertension Mother   . Heart attack Father   . Diabetes Mellitus II Sister   . Hypertension Sister   . Cancer - Other Sister   . Migraines Niece   . Colon cancer Neg Hx      Social History:  reports that she has quit smoking. Her smoking use included cigarettes. She has never used smokeless tobacco. She reports previous alcohol use. She reports that she does not use drugs.   Exam: Current vital signs: Pulse 94   Resp (!) 21   SpO2 100%  Vital signs in last 24 hours: Pulse Rate:  [94-95] 94 (02/18 1245) Resp:  [21] 21 (02/18 1242) SpO2:  [100 %] 100 % (02/18 1245)   Physical Exam  Constitutional: Appears well-developed and well-nourished.  Psych: Affect appropriate to situation Eyes: No scleral injection HENT: No OP obstrucion MSK: no joint deformities.  Cardiovascular: Normal rate and regular rhythm.  Respiratory: Effort normal, non-labored breathing  GI: Soft.  No distension. There is no tenderness.  Skin: WDI  Neuro: Mental Status: Patient is awake, alert, oriented to person, place, month, year. She does appear to have some difficulty with word finding and reading. Cranial Nerves: II: Partial right visual field deficit in both the upper and lower field pupils are equal, round, and reactive to light.   III,IV, VI: EOMI without ptosis or diploplia.  V: Facial sensation is symmetric to temperature VII: Facial movement with mild right facial weakness VIII: hearing is intact to voice X: Uvula elevates symmetrically XI: Shoulder shrug is symmetric. XII: tongue is  midline without atrophy or fasciculations.  Motor: Tone is normal. Bulk is normal. 5/5 strength was present on the left, she has 2/5 proximal, 4+/5 distal weakness of the right upper extremity, good strength in right lower extremity Sensory: Sensation is symmetric to light touch and temperature in the arms and legs. Cerebellar: FNF and HKS are intact bilaterally  I have reviewed labs in epic and the results pertinent to this consultation are: Creatinine 0.7  I have reviewed the images obtained: CT head-unremarkable  Impression: 84 year old female with right-sided weakness and confusion.  Though she states that she has problems with her right side for prolonged period of time, family has not noticed this and I suspect the patient may be confused about this.  The symptoms are most consistent with a small infarct, possibilities include previous stroke with recrudescence versus new acute ischemic stroke.  An MRI would help further differentiate.  Recommendations: 1) MRI brain 2) further stroke work-up if positive. 3) if negative, EEG and work-up for metabolic encephalopathy   Roland Rack, MD [facility] [phone]  If 7pm- 7am, please page neurology on call as listed in [place].

## 2019-09-16 NOTE — ED Notes (Signed)
Changed pt linens and gown, perineal care, placed purewick back in position, placed brief on pt to secure purewick, emptied suction canister

## 2019-09-16 NOTE — Code Documentation (Addendum)
84 yo female coming from home where she lives with daughter. Discharged on 1/8 after a UTI and reported to be confused from this time on. Today, pt noted confusion worsened and called EMS. EMS transported and reported new onset of right arm weakness with Unknown LKW. EDP called Code Stroke. Stroke Team assessed in CT. CT Head completed. 20 gauge Diffusics placed. CTA/CTP completed. Returned to 21. During assessment, Pt reports being unable to read and reports being illiterate. Only able to name three pictures and was not able to explain NIHSS picture. Slight right field cut noted on upper and lower without complete hemianopia. Placed on cardiac monitor. Handoff given to [employer], Therapist, sports.   LKW Unclear - No tPA   No IR - CTA did not reveal LVO  Plan of Care: Q2 Hours mNIHSS, Admit

## 2019-09-17 ENCOUNTER — Encounter (HOSPITAL_COMMUNITY): Payer: Self-pay | Admitting: Internal Medicine

## 2019-09-17 DIAGNOSIS — E43 Unspecified severe protein-calorie malnutrition: Secondary | ICD-10-CM | POA: Insufficient documentation

## 2019-09-17 DIAGNOSIS — J42 Unspecified chronic bronchitis: Secondary | ICD-10-CM

## 2019-09-17 DIAGNOSIS — M05741 Rheumatoid arthritis with rheumatoid factor of right hand without organ or systems involvement: Secondary | ICD-10-CM

## 2019-09-17 DIAGNOSIS — I10 Essential (primary) hypertension: Secondary | ICD-10-CM

## 2019-09-17 DIAGNOSIS — E1165 Type 2 diabetes mellitus with hyperglycemia: Secondary | ICD-10-CM

## 2019-09-17 DIAGNOSIS — E118 Type 2 diabetes mellitus with unspecified complications: Secondary | ICD-10-CM

## 2019-09-17 DIAGNOSIS — M05742 Rheumatoid arthritis with rheumatoid factor of left hand without organ or systems involvement: Secondary | ICD-10-CM

## 2019-09-17 DIAGNOSIS — I5032 Chronic diastolic (congestive) heart failure: Secondary | ICD-10-CM

## 2019-09-17 LAB — LIPID PANEL
Cholesterol: 106 mg/dL (ref 0–200)
HDL: 34 mg/dL — ABNORMAL LOW (ref >40)
LDL Cholesterol: 34 mg/dL (ref 0–99)
Total CHOL/HDL Ratio: 3.1 RATIO
Triglycerides: 190 mg/dL — ABNORMAL HIGH (ref <150)
VLDL: 38 mg/dL (ref 0–40)

## 2019-09-17 LAB — BASIC METABOLIC PANEL
Anion gap: 11 (ref 5–15)
BUN: 10 mg/dL (ref 8–23)
CO2: 23 mmol/L (ref 22–32)
Calcium: 8.4 mg/dL — ABNORMAL LOW (ref 8.9–10.3)
Chloride: 101 mmol/L (ref 98–111)
Creatinine, Ser: 1.07 mg/dL — ABNORMAL HIGH (ref 0.44–1.00)
GFR calc Af Amer: 56 mL/min — ABNORMAL LOW (ref >60)
GFR calc non Af Amer: 49 mL/min — ABNORMAL LOW (ref >60)
Glucose, Bld: 108 mg/dL — ABNORMAL HIGH (ref 70–99)
Potassium: 3.5 mmol/L (ref 3.5–5.1)
Sodium: 135 mmol/L (ref 135–145)

## 2019-09-17 LAB — GLUCOSE, CAPILLARY
Glucose-Capillary: 70 mg/dL (ref 70–99)
Glucose-Capillary: 116 mg/dL — ABNORMAL HIGH (ref 70–99)
Glucose-Capillary: 101 mg/dL — ABNORMAL HIGH (ref 70–99)
Glucose-Capillary: 90 mg/dL (ref 70–99)
Glucose-Capillary: 88 mg/dL (ref 70–99)
Glucose-Capillary: 78 mg/dL (ref 70–99)

## 2019-09-17 LAB — HEMOGLOBIN A1C
Hgb A1c MFr Bld: 6.2 % — ABNORMAL HIGH (ref 4.8–5.6)
Mean Plasma Glucose: 131.24 mg/dL

## 2019-09-17 MED ORDER — CLOPIDOGREL BISULFATE 75 MG PO TABS
75.0000 mg | ORAL_TABLET | Freq: Every day | ORAL | Status: DC
  Administered 2019-09-17 – 2019-09-19 (×3): 75 mg via ORAL
  Filled 2019-09-17 (×3): qty 1

## 2019-09-17 MED ORDER — ATORVASTATIN CALCIUM 10 MG PO TABS
20.0000 mg | ORAL_TABLET | Freq: Every day | ORAL | Status: DC
  Administered 2019-09-18: 20 mg via ORAL
  Filled 2019-09-17: qty 2

## 2019-09-17 MED ORDER — ENSURE ENLIVE PO LIQD
237.0000 mL | Freq: Three times a day (TID) | ORAL | Status: DC
  Administered 2019-09-17 – 2019-09-19 (×3): 237 mL via ORAL

## 2019-09-17 MED ORDER — ASPIRIN EC 81 MG PO TBEC
81.0000 mg | DELAYED_RELEASE_TABLET | Freq: Every day | ORAL | Status: DC
  Administered 2019-09-18 – 2019-09-19 (×2): 81 mg via ORAL
  Filled 2019-09-17 (×2): qty 1

## 2019-09-17 MED ORDER — ADULT MULTIVITAMIN W/MINERALS CH
1.0000 | ORAL_TABLET | Freq: Every day | ORAL | Status: DC
  Administered 2019-09-17 – 2019-09-19 (×3): 1 via ORAL
  Filled 2019-09-17 (×3): qty 1

## 2019-09-17 MED ORDER — QUETIAPINE FUMARATE 25 MG PO TABS: 12.5000 mg | ORAL_TABLET | Freq: Every day | ORAL | Status: DC

## 2019-09-17 MED ORDER — DOCUSATE SODIUM 100 MG PO CAPS
100.0000 mg | ORAL_CAPSULE | Freq: Two times a day (BID) | ORAL | Status: DC
  Administered 2019-09-17 – 2019-09-19 (×4): 100 mg via ORAL
  Filled 2019-09-17 (×4): qty 1

## 2019-09-17 NOTE — Progress Notes (Signed)
Initial Nutrition Assessment  DOCUMENTATION CODES:   Severe malnutrition in context of chronic illness  INTERVENTION:   Ensure Enlive po TID, each supplement provides 350 kcal and 20 grams of protein  MVI with minerals daily    NUTRITION DIAGNOSIS:   Severe Malnutrition related to chronic illness as evidenced by severe muscle depletion, severe fat depletion, moderate fat depletion, moderate muscle depletion, energy intake < or equal to 75% for > or equal to 1 month, percent weight loss.   GOAL:   Patient will meet greater than or equal to 90% of their needs    MONITOR:   PO intake, Supplement acceptance, Weight trends, Labs, I & O's  REASON FOR ASSESSMENT:   Consult Other (Comment)(CVA)  ASSESSMENT:   Pt with a PMH significant for DM, OSA on BIPAP, frequent UTIs, HTN, HLD, COPD, RA, and CHF presenting with AMS.  The patient complains of pain all over her body. Pt has had 5 admission and 3 ER visits in the last 6 months, with the most recent from 1/29-2/5 with syncope. Pt found to have "1-2 cm region of early acute infarction in the left posterior frontal brain" on MRI 2/18.  Discussed pt with RN.  No PO intake documented.   Pt reports appetite has been varied, but states she has been eating only 1 meal on most days over the last month. For this meal, pt reports often having chicken or fish with potatoes.   Pt noted to have experienced a 15.6% wt loss over the last 3 months, which is significant for time frame. Pt endorses this wt loss and attributes it to her frequent UTIs.    Medications reviewed and include: SSI, Slow-Mag, Reglan, Miralax Labs reviewed. CBGs 70-116   NUTRITION - FOCUSED PHYSICAL EXAM:    Most Recent Value  Orbital Region  Moderate depletion  Upper Arm Region  Severe depletion  Thoracic and Lumbar Region  Severe depletion  Buccal Region  Mild depletion  Temple Region  Moderate depletion  Clavicle Bone Region  Moderate depletion  Clavicle  and Acromion Bone Region  Moderate depletion  Scapular Bone Region  Moderate depletion  Dorsal Hand  Mild depletion  Patellar Region  Severe depletion  Anterior Thigh Region  Severe depletion  Posterior Calf Region  Severe depletion  Edema (RD Assessment)  None  Hair  Reviewed  Eyes  Reviewed  Mouth  Reviewed  Skin  Reviewed  Nails  Reviewed       Diet Order:   Diet Order            Diet heart healthy/carb modified Room service appropriate? Yes; Fluid consistency: Thin  Diet effective ____              EDUCATION NEEDS:   No education needs have been identified at this time  Skin:  Skin Assessment: Reviewed RN Assessment  Last BM:  2/18  Height:   Ht Readings from Last 1 Encounters:  09/17/19 5\' 5"  (1.651 m)    Weight:   Wt Readings from Last 1 Encounters:  09/17/19 48.1 kg    BMI:  Body mass index is 17.65 kg/m.  Estimated Nutritional Needs:   Kcal:  1400-1600  Protein:  75-90 grams  Fluid:  >1.4L/d    Larkin Ina, MS, RD, LDN RD pager number and weekend/on-call pager number located in [place].

## 2019-09-17 NOTE — Progress Notes (Signed)
PROGRESS NOTE  [name] [id number] DOB: [birth year] DOA: 09/16/2019 PCP: [name], PA-C  Brief History    [name] is a 84 y.o. female with medical history significant of DM; OSA on BIPAP; HTN; HLD; COPD; RA on chronic immunosuppression; and chronic diastolic CHF presenting withAMS.  Her husband reported confusion for a few days and right-sided weakness today.  Her niece was thinking this might be related to her new medications (Bentyl).  The patient complains of pain all over her body.  She was last admitted from 1/29-2/5 with syncope; this was thought to be related to a vasovagal episode with dehydration.  She has had 5 admissions and 3 ER visits in the last 6 months.  She had an echo on 2/5 with normal EF and asymmetric basal septal hypertrophy - ? HOCM vs. Amyloidosis.  Probable CVA.  She has had AMS since last d/c but worse the last few days. Noted to have RUE paralysis.  CT ok, neuro is following and recommending MRI.  [facility] were consulted to admit the patient for further evaluation and treatment.  She was admitted to a telemetry bed. Neurology was consulted. MRI was performed and demonstrated 1-2 cm infarct of the left posterior frontal brain. There was no mass effect or hemorrhage evident.   Neurology has been consulted and the patient has been evaluated by Dr. Rodney Booze. He has recommended a stroke work up.  Consultants  . Neurology  Procedures  . None  Antibiotics   Anti-infectives (From admission, onward)   None    .   Subjective  The patient is sitting up at bedside. She is confused. No new complaints.  Objective   Vitals:  Vitals:   09/17/19 0842 09/17/19 1141  BP: (!) 98/52 104/60  Pulse: 99 98  Resp: 16 20  Temp:  98.2 F (36.8 C)  SpO2: 92% 100%   Exam:  Constitutional:  . The patient is awake, alert, and oriented x 3. No acute distress. Respiratory:  . No increased work of breathing. . No wheezes,  rales, or rhonchi . No tactile fremitus Cardiovascular:  . Regular rate and rhythm . No murmurs, ectopy, or gallups. . No lateral PMI. No thrills. Abdomen:  . Abdomen is soft, non-tender, non-distended . No hernias, masses, or organomegaly . Normoactive bowel sounds.  Musculoskeletal:  . No cyanosis, clubbing, or edema Skin:  . No rashes, lesions, ulcers . palpation of skin: no induration or nodules Neurologic:  . CN 2-12 intact . Sensation all 4 extremities intact Psychiatric:  . Mental status o Mood, affect appropriate o Orientation to person, place, time  . judgment and insight appear intact  I have personally reviewed the following:   Today's Data  . Vitals, BMP, Lipid panel, HbA1c  Imaging  . MRI . CXR . CTA head and Neck  Cardiology Data  . Echocardiogram is pending  Scheduled Meds: . aspirin  300 mg Rectal Daily   Or  . aspirin  325 mg Oral Daily  . atorvastatin  40 mg Oral q1800  . enoxaparin (LOVENOX) injection  40 mg Subcutaneous Q24H  . insulin aspart  0-15 Units Subcutaneous TID WC  . insulin aspart  0-5 Units Subcutaneous QHS  . magnesium chloride  1 tablet Oral BID  . metoCLOPramide  5 mg Oral TID AC  . mometasone-formoterol  2 puff Inhalation BID  . pantoprazole  40 mg Oral Daily  . polyethylene glycol  17 g Oral Daily   Continuous Infusions: . sodium chloride  50 mL/hr at 09/17/19 0327    Principal Problem:   CVA (cerebral vascular accident) [facility]) Active Problems:   Rheumatoid arthritis ([place])   COPD (chronic obstructive pulmonary disease) ([place])   Uncontrolled type 2 diabetes mellitus with complication (HCC)   Chronic diastolic CHF (congestive heart failure) ([place])   Essential hypertension   LOS: 1 day   A & P  Acute 1-2 cm infarct of the left posterior frontal brain: The patient has been admitted to a telemetry bed. Neurology has been consulted. Echocardiogram is pending. MRI confirmed CVA. CTA neck demonstrated less than 50% stenosis  bilaterally due to calcified plaque within the proximal ICA. Vertebral arteries are patent without stenosis. Cervical spondylosis with multilevel posteroir disc osteophytes. CTA head demonstrated no intracranial large vessel occlusion. There is mild stenosis of the cavernous left ICA. There is focal mild/moderate stenosis within the V4 right vertebral artery due to calcified plaque. There is moderate ssegmental stenosis within the P2 right posterior cerebral artery. CT perfusion of the head has identified no core infarct and no region of critically hypoperfused parenchyma. Echocardiogram and PT/OT eval are pending.  Hypertension: Permissive hypertension for now. Restart Coreg in 24-48 hours.  Hyperlipidemia: Increase lipitor to 40 mg daily  DM II: Glucoses are covered with FSBS and SSI. Will recheck HbA1c. No medication for DM II at home.  Diabetic gastroparesis with chronic nausea and vomiting: Chronic gastritis. Continue protonix, miralax, and colase as well as bentyl.  Rheumatoid Arthritis: Places the patient at high risk of cardiovascular disease. Plaquenil and daily methylprednisolone have recently been discontinued.   COPD: Noted and stable. Continue albuterol and symbicort.  Chronic diastolic CHF: The patient appears euvolemic.   I have seen and examined this patient myself. I have spent  32 minutes in her evaluation and care.  DVT prophylaxis:Lovenox  Code Status:DNR - confirmed with patient Family Communication:None present; I spoke with her niece at the time of the admission Disposition Plan:Home once clinically improved  Raylin Diguglielmo, DO [facility] Direct contact: see [email or web address]  7PM-7AM contact night coverage as above 09/17/2019, 3:45 PM  LOS: 1 day

## 2019-09-17 NOTE — Plan of Care (Signed)

## 2019-09-17 NOTE — Evaluation (Signed)
Physical Therapy Evaluation Patient Details Name: [name] MRN: [id number] DOB: 1936-06-05 Today's Date: 09/17/2019   History of Present Illness  [name] is a 84 y.o. female with medical history significant of DM; OSA on BIPAP; HTN; HLD; COPD; RA on chronic immunosuppression; and chronic diastolic CHF presenting with AMS.  Her husband reported confusion for a few days and right-sided weakness today.  Her niece was thinking this might be related to her new medications (Bentyl).  The patient complains of pain all over her body. MRI:1-2 cm region of early acute infarction in the left posteriorfrontal brain  Clinical Impression  Prior to admission, pt lives with her husband, requires assist with some ADL's and is a limited household ambulator with a walker. Pt presents with decreased functional use of right arm, balance impairments, decreased endurance and cognitive deficits. Ambulating 30 feet with a walker at a min guard assist level. Requires manual assist for maneuvering device due to baseline visual impairments. Recommend follow up HHPT to address deficits.     Follow Up Recommendations Home health PT;Supervision/Assistance - 24 hour    Equipment Recommendations  None recommended by PT    Recommendations for Other Services       Precautions / Restrictions Precautions Precautions: Fall Restrictions Weight Bearing Restrictions: No      Mobility  Bed Mobility Overal bed mobility: Needs Assistance Bed Mobility: Supine to Sit     Supine to sit: Min guard;HOB elevated        Transfers Overall transfer level: Needs assistance Equipment used: Rolling walker (2 wheeled) Transfers: Sit to/from Stand Sit to Stand: Min assist         General transfer comment: cues for safe hand placement and with some manuvering with RW  Ambulation/Gait Ambulation/Gait assistance: Min guard Gait Distance (Feet): 30 Feet Assistive device: Rolling walker (2 wheeled) Gait  Pattern/deviations: Step-through pattern;Decreased stride length;Trunk flexed Gait velocity: decreased   General Gait Details: Requiring manual assist for maneuvering walker around obstacles, occasionally bumping into objects  Stairs            Wheelchair Mobility    Modified Rankin (Stroke Patients Only) Modified Rankin (Stroke Patients Only) Pre-Morbid Rankin Score: Moderately severe disability Modified Rankin: Moderately severe disability     Balance Overall balance assessment: Needs assistance Sitting-balance support: No upper extremity supported;Feet supported Sitting balance-Leahy Scale: Good     Standing balance support: Bilateral upper extremity supported Standing balance-Leahy Scale: Poor Standing balance comment: reliant on external support                             Pertinent Vitals/Pain Pain Assessment: Faces Pain Score: 8  Faces Pain Scale: Hurts little more Pain Location: Right UE initially with AAROM at shoulder (flexion) but after first time A'ing her then there was not any more c/o pain Pain Descriptors / Indicators: Grimacing Pain Intervention(s): Monitored during session;Repositioned    Home Living Family/patient expects to be discharged to:: Private residence Living Arrangements: Spouse/significant other;Other relatives(niece) Available Help at Discharge: Family;Available 24 hours/day;Personal care attendant Type of Home: House Home Access: Stairs to enter Entrance Stairs-Rails: Right Entrance Stairs-Number of Steps: 3 Home Layout: One level Home Equipment: Tub bench;Bedside commode;Walker - 2 wheels Additional Comments: Niece and PCAs A with bathing/dressing    Prior Function Level of Independence: Needs assistance   Gait / Transfers Assistance Needed: Uses RW  ADL's / Homemaking Assistance Needed: assist with bathing, dressing  Comments: Aide 2-3  days/wk; helps with bathing, dressing.      Hand Dominance   Dominant  Hand: Right    Extremity/Trunk Assessment   Upper Extremity Assessment Upper Extremity Assessment: Defer to OT evaluation RUE Deficits / Details: Pt continues with limited shoulder flexion (AROM), can tolerate AAROM at shoulder (~70 degrees) with first time painful then no grimacing after that; able to move all other joints but slowly and decreased overall composite finger flexion and finger to thumb opposition. Per last admission pt reports decreased use of RUE at shoulder due to long standing arthritis. She was able to reach up with RUE to grab onto RW while seated at EOB RUE Coordination: decreased fine motor;decreased gross motor LUE Coordination: decreased fine motor    Lower Extremity Assessment Lower Extremity Assessment: RLE deficits/detail;LLE deficits/detail RLE Deficits / Details: Strength 5/5 LLE Deficits / Details: Strength 5/5    Cervical / Trunk Assessment Cervical / Trunk Assessment: Kyphotic  Communication   Communication: No difficulties  Cognition Arousal/Alertness: Awake/alert Behavior During Therapy: Flat affect Overall Cognitive Status: Impaired/Different from baseline Area of Impairment: Attention;Safety/judgement;Problem solving;Awareness                   Current Attention Level: Sustained Memory: Decreased short-term memory Following Commands: Follows one step commands with increased time;Follows multi-step commands inconsistently Safety/Judgement: Decreased awareness of safety;Decreased awareness of deficits Awareness: Emergent Problem Solving: Difficulty sequencing;Requires verbal cues;Slow processing General Comments: Decreased psychomotor processing, aware she had a stroke. Likely close to baselnie      General Comments      Exercises     Assessment/Plan    PT Assessment Patient needs continued PT services  PT Problem List Decreased strength;Decreased mobility;Decreased activity tolerance;Decreased balance;Decreased range of  motion;Decreased knowledge of use of DME;Pain;Cardiopulmonary status limiting activity;Decreased cognition       PT Treatment Interventions DME instruction;Therapeutic activities;Gait training;Therapeutic exercise;Patient/family education;Balance training;Stair training;Functional mobility training;Neuromuscular re-education    PT Goals (Current goals can be found in the Care Plan section)  Acute Rehab PT Goals Patient Stated Goal: to go home PT Goal Formulation: With patient Time For Goal Achievement: 10/01/19 Potential to Achieve Goals: Fair    Frequency Min 3X/week   Barriers to discharge        Co-evaluation PT/OT/SLP Co-Evaluation/Treatment: Yes Reason for Co-Treatment: For patient/therapist safety;To address functional/ADL transfers PT goals addressed during session: Mobility/safety with mobility OT goals addressed during session: Strengthening/ROM;Proper use of Adaptive equipment and DME;ADL's and self-care       AM-PAC PT "6 Clicks" Mobility  Outcome Measure Help needed turning from your back to your side while in a flat bed without using bedrails?: None Help needed moving from lying on your back to sitting on the side of a flat bed without using bedrails?: A Little Help needed moving to and from a bed to a chair (including a wheelchair)?: A Little Help needed standing up from a chair using your arms (e.g., wheelchair or bedside chair)?: A Little Help needed to walk in hospital room?: A Little Help needed climbing 3-5 steps with a railing? : A Lot 6 Click Score: 18    End of Session Equipment Utilized During Treatment: Gait belt Activity Tolerance: Patient limited by fatigue Patient left: in chair;with call bell/phone within reach;with chair alarm set Nurse Communication: Mobility status PT Visit Diagnosis: Muscle weakness (generalized) (M62.81);Difficulty in walking, not elsewhere classified (R26.2);History of falling (Z91.81)    Time: [id number] PT Time  Calculation (min) (ACUTE ONLY): 21 min   Charges:  PT Evaluation $PT Eval Moderate Complexity: 1 Mod            [name], [place], DPT Acute Rehabilitation Services Pager [phone] Office [phone]   [name] 09/17/2019, 2:44 PM

## 2019-09-17 NOTE — Progress Notes (Signed)
Received pt from ED.  Paged on call hospitalist and on call neurologist to inform them of her admission.  No signs of distress.  Oriented to room and surroundings.  Stroke education begun.  Falling asleep, as said she did not get much sleep last night and was in ED much of the day.

## 2019-09-17 NOTE — Evaluation (Addendum)
Speech Language Pathology Evaluation Patient Details Name: [name] MRN: [id number] DOB: 04/06/1936 Today's Date: 09/17/2019 Time: [id number] SLP Time Calculation (min) (ACUTE ONLY): 30 min  Problem List:  Patient Active Problem List   Diagnosis Date Noted  . CVA (cerebral vascular accident) ([place]) 09/16/2019  . DNR (do not resuscitate) 08/02/2019  . Dehydration 08/01/2019  . Urinary tract infection symptoms 07/31/2019  . Diabetic gastroparesis ([place]) 07/31/2019  . Unexplained weight loss 07/12/2019  . Non-intractable vomiting   . Nausea, vomiting, and diarrhea 05/30/2019  . Hypokalemia 05/30/2019  . Hypomagnesemia 05/30/2019  . Gastritis 05/30/2019  . Elevated lipase 05/30/2019  . SIRS (systemic inflammatory response syndrome) ([place]) 05/30/2019  . History of COPD   . Other chronic pain 04/22/2019  . Dyspnea 08/10/2018  . Acute diastolic heart failure ([place]) 06/15/2018  . Essential hypertension 06/15/2018  . 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 Gastric mass   . Sepsis secondary to UTI ([place])   . Uncontrolled type 2 diabetes mellitus with complication ([place])   . AKI (acute kidney injury) ([place])   . Chronic diastolic CHF (congestive heart failure) ([place])   . E-coli UTI 01/09/2017  . Sepsis ([place]) 01/09/2017  . Constipation 01/09/2017  . COPD (chronic obstructive pulmonary disease) ([place]) 01/09/2017  . Multiple falls 06/14/2015  . Weight loss 06/14/2015  . Acute respiratory failure with hypoxia ([place]) 06/14/2015  . Orthostasis 06/14/2015  . Acute respiratory failure ([place]) 06/14/2015  . Elevated lactic acid level 01/22/2015  . 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 Chronic diastolic heart failure, NYHA class 1 ([place]) 01/22/2015  . Tachycardia with heart rate 100-120 beats per minute 04/08/2014  . DM (diabetes mellitus) ([place]) 04/08/2014  . Chest pain 04/07/2014  . Syncope 04/07/2014  . Hyperglycemia 12/11/2012  . Syncope and collapse 12/11/2012  . Dehydration, mild 12/11/2012  .  Noncompliance 12/11/2012  . UTI (lower urinary tract infection) 12/11/2012  . 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 Rheumatoid arthritis ([place]) 12/11/2012   Past Medical History:  Past Medical History:  Diagnosis Date  . Allergy   . Anemia    "when I was young"  . Anginal pain ([place])   . Anxiety   . Arthritis    "all over my body"   . Chest pain 04/07/2014  . Chronic diastolic heart failure, NYHA class 1 ([place]) 01/22/2015  . Chronic lower back pain   . Chronic steroid use 12/11/2012  . COPD (chronic obstructive pulmonary disease) ([place]) 01/09/2017  . 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 Elevated lactic acid level 01/22/2015  . Essential hypertension 06/15/2018  . Frequent UTI    "recently" (06/14/2015)  . Gastric mass   . GERD (gastroesophageal reflux disease)   . Headache(784.0)    "weekly; wake up w/them " (110/19/2018)  . Heart murmur   . High cholesterol   . History of blood transfusion ~ 1954   "related to my periods"  . Migraine    "once/month maybe" (05/16/2017)  . Multiple falls 06/14/2015  . Noncompliance 12/11/2012  . OSA (obstructive sleep apnea)    "I think I'm using a BiPAP" (06/14/2015)  . Sepsis ([place]) 01/09/2017  . Syncope 08/27/2019  . Syncope and collapse 12/11/2012  . Tachycardia with heart rate 100-120 beats per minute 04/08/2014  . Type II diabetes mellitus ([place])   . Uncontrolled type 2 diabetes mellitus with complication [facility])    Past Surgical History:  Past Surgical History:  Procedure Laterality Date  . APPENDECTOMY  1970   /  notes 12/12/2010  . BACK SURGERY    . BIOPSY  05/25/2019   Procedure: BIOPSY;  Surgeon: Rush Landmark Telford Nab., MD;  Location: [facility];  Service: Gastroenterology;;  . [place]   Archie Endo 12/12/2010  . ESOPHAGOGASTRODUODENOSCOPY N/A 05/25/2019   Procedure: ESOPHAGOGASTRODUODENOSCOPY (EGD);  Surgeon: Irving Copas., MD;  Location: [facility];  Service: Gastroenterology;  Laterality: N/A;  . EUS N/A 10/16/2017   Procedure: UPPER ENDOSCOPIC ULTRASOUND (EUS) RADIAL;  Surgeon: Milus Banister, MD;  Location: [facility];  Service: Endoscopy;  Laterality: N/A;  . FRACTURE SURGERY    . LAPAROSCOPIC CHOLECYSTECTOMY  1990's  . LUMBAR MICRODISCECTOMY Left 09/2006   L5-S1/notes 12/12/2010  . MULTIPLE EXTRACTIONS WITH ALVEOLOPLASTY Bilateral 06/13/2017   Procedure: MULTIPLE EXTRACTION WITH ALVEOLOPLASTY;  Surgeon: Diona Browner, DDS;  Location: [facility];  Service: Oral Surgery;  Laterality: Bilateral;  . MULTIPLE TOOTH EXTRACTIONS    . ORIF SHOULDER FRACTURE Left ~ 2009   "put cement down in it"   . TONSILLECTOMY  1950's?  . TOTAL ABDOMINAL HYSTERECTOMY  1979   Archie Endo 12/12/2010   HPI:  [name] is a 84 y.o. female with a history of rheumatoid arthritis, gastroparesis, with a history of rheumatoid arthritis, , COPD who was recently admitted with a syncopal episode felt to be due to vasovagal and dehydration.  Pt admitted for AMS, found to have "1-2 cm region of early acute infarction in the left posterior frontal brain" on MRI 2/18.   Assessment / Plan / Recommendation Clinical Impression   Patient encountered at bedside, alert and cooperative. Husband entered room about halfway through this assessment. Patient presents with moderate cognitive-communication deficits,  Mild-mod receptive and mild expressive language deficits. Further diagnostic tx warranted to investigate receptive language skills as patient's performance on tasks may be impacted by difficulty with recall.   Patient oriented to self, place, city, state, reason for hospitalization, but not oriented to month/date/year.  Patient's baseline status somewhat unclear, but patient and husband state they both feel "memory is worse." Patient endorses new word-finding difficulty as well. In receptive language tasks, patient able to answer basic yes/no questions with 100%  accuracy and complex yes/no questions with 75% accuracy. Pt able to follow one step verbal commands with 100% accuracy, but following 2-step commands with 0% accuracy (patient performing first step only in all instances). Patient able to engage in automatic speech tasks without difficulty, her speech is intelligible with no evidence of dysarthria or motor speech impairment. In informal confrontation naming tasks (objects in room), patient with one instance of semantic paraphasia (called the television "telephone"). She was able to accurately name 8/10 objects. Portions of the COGNISTAT administered to assist with this assessment. See below for score details. Patient with noted impairment in the areas of orientation, naming, memory, and judgment.   COGNISTAT,  Orientation 6/12 moderate impairment Attention 6/8 [facility] Comprehension: not given Repetition: not given Naming: 3/8 moderate impairment Construction: not given Memory: 1/12 severe impairment Caclulations: not given Reasoning similiarities: not given Reasoning judgment: 3/6 mild impairment   Patient will benefit from skilled ST to address aforementioned deficits. Further ST recommended at the next venue of care as well ([facility]).   SLP Assessment  SLP Recommendation/Assessment: Patient needs continued Speech Lanaguage Pathology Services SLP Visit Diagnosis: Cognitive communication deficit (R41.841);Aphasia (R47.01)    Follow Up Recommendations  Inpatient Rehab;Home health SLP;24 hour supervision/assistance    Frequency and Duration min 2x/week  2 weeks  SLP Evaluation Cognition  Overall Cognitive Status: Impaired/Different from baseline Arousal/Alertness: Awake/alert Orientation Level: Oriented to person;Oriented to place;Oriented to situation Awareness: Appears intact Problem Solving: Impaired Problem Solving Impairment: Functional complex       Comprehension  Auditory Comprehension Overall Auditory Comprehension:  Impaired Yes/No Questions: Impaired Basic Biographical Questions: 76-100% accurate Basic Immediate Environment Questions: 75-100% accurate Complex Questions: 25-49% accurate Commands: Impaired One Step Basic Commands: 75-100% accurate Two Step Basic Commands: 0-24% accurate Multistep Basic Commands: Not tested Visual Recognition/Discrimination Discrimination: Not tested Reading Comprehension Reading Status: Unable to assess (comment)(low literacy)    Expression Expression Primary Mode of Expression: Verbal Verbal Expression Overall Verbal Expression: Impaired Initiation: No impairment Automatic Speech: Counting Level of Generative/Spontaneous Verbalization: Conversation Repetition: No impairment Naming: Impairment Responsive: 51-75% accurate Confrontation: Impaired Convergent: 50-74% accurate Verbal Errors: Semantic paraphasias;Phonemic paraphasias Written Expression Dominant Hand: Right Written Expression: Not tested   Oral / Motor  Oral Motor/Sensory Function Overall Oral Motor/Sensory Function: Within functional limits Motor Speech Overall Motor Speech: Appears within functional limits for tasks assessed   GO                    Ulla Mckiernan 09/17/2019, 11:27 AM  Marina Goodell, M.Ed., [facility] Acute Rehabilitation [phone]: Acute Rehab office [phone] - pager

## 2019-09-17 NOTE — Evaluation (Signed)
Occupational Therapy Evaluation Patient Details Name: [name] MRN: [id number] DOB: 08-16-1935 Today's Date: 09/17/2019    History of Present Illness [name] is a 84 y.o. female with medical history significant of DM; OSA on BIPAP; HTN; HLD; COPD; RA on chronic immunosuppression; and chronic diastolic CHF presenting with AMS.  Her husband reported confusion for a few days and right-sided weakness today.  Her niece was thinking this might be related to her new medications (Bentyl).  The patient complains of pain all over her body. MRI:1-2 cm region of early acute infarction in the left posteriorfrontal brain   Clinical Impression   This 84 yo female admitted with above presents to acute OT with PLOF needing some A with basic ADLs and mobility and now needing increased A (min A-mod A) due to decreased use of RUE (at shoulder), decreased balance, and decreased mobility. She will benefit from acute OT with follow up [place].    Follow Up Recommendations  Home health OT;Supervision/Assistance - 24 hour    Equipment Recommendations  3 in 1 bedside commode       Precautions / Restrictions Precautions Precautions: Fall Restrictions Weight Bearing Restrictions: No      Mobility Bed Mobility Overal bed mobility: Needs Assistance Bed Mobility: Supine to Sit     Supine to sit: Min guard;HOB elevated        Transfers Overall transfer level: Needs assistance Equipment used: Rolling walker (2 wheeled) Transfers: Sit to/from Stand Sit to Stand: Min assist         General transfer comment: cues for safe hand placement and with some manuvering with RW    Balance Overall balance assessment: Needs assistance Sitting-balance support: No upper extremity supported;Feet supported Sitting balance-Leahy Scale: Good     Standing balance support: Bilateral upper extremity supported Standing balance-Leahy Scale: Poor Standing balance comment: reliant on external support                           ADL either performed or assessed with clinical judgement   ADL Overall ADL's : Needs assistance/impaired Eating/Feeding: Minimal assistance;Sitting   Grooming: Minimal assistance;Sitting Grooming Details (indicate cue type and reason): EOB Upper Body Bathing: Minimal assistance;Sitting Upper Body Bathing Details (indicate cue type and reason): EOB Lower Body Bathing: Moderate assistance Lower Body Bathing Details (indicate cue type and reason): min A sit<>stand Upper Body Dressing : Moderate assistance Upper Body Dressing Details (indicate cue type and reason): EOB Lower Body Dressing: Moderate assistance Lower Body Dressing Details (indicate cue type and reason): min A sit<>stand; pt was able to adjust socks while seated EOB with minguard A for safety Toilet Transfer: Minimal assistance;Ambulation;RW;Comfort height toilet;Grab bars   Toileting- Clothing Manipulation and Hygiene: Minimal assistance;Sit to/from stand               Vision   Additional Comments: Pt ran into sink on left with RW on way to bathroom and end of bed on right on way around the bed. Pt able to locate all items asked of her on the table in front of her (far right/far left/ and midline)--turns head and eyes. Able to tell me correct number of fingers on her right but more difficulty on left--she does look both ways instead of using her perpherial vision to tell me.            Pertinent Vitals/Pain Pain Assessment: Faces Pain Score: 8  Faces Pain Scale: Hurts little more Pain Location: Right UE  initially with AAROM at shoulder (flexion) but after first time A'ing her then there was not any more c/o pain Pain Descriptors / Indicators: Grimacing Pain Intervention(s): Monitored during session;Repositioned     Hand Dominance Right   Extremity/Trunk Assessment Upper Extremity Assessment Upper Extremity Assessment: RUE deficits/detail;LUE deficits/detail RUE Deficits / Details:  Pt continues with limited shoulder flexion (AROM), can tolerate AAROM at shoulder (~70 degrees) with first time painful then no grimacing after that; able to move all other joints but slowly and decreased overall composite finger flexion and finger to thumb opposition. Per last admission pt reports decreased use of RUE at shoulder due to long standing arthritis. She was able to reach up with RUE to grab onto RW while seated at EOB RUE Coordination: decreased fine motor;decreased gross motor LUE Coordination: decreased fine motor           Communication Communication Communication: No difficulties   Cognition Arousal/Alertness: Awake/alert Behavior During Therapy: Flat affect Overall Cognitive Status: Impaired/Different from baseline Area of Impairment: Orientation;Attention;Safety/judgement;Problem solving                   Current Attention Level: Sustained     Safety/Judgement: Decreased awareness of safety;Decreased awareness of deficits   Problem Solving: Difficulty sequencing;Requires verbal cues                Home Living Family/patient expects to be discharged to:: Private residence Living Arrangements: Spouse/significant other;Other relatives(niece) Available Help at Discharge: Family;Available 24 hours/day;Personal care attendant Type of Home: House Home Access: Stairs to enter [employer] of Steps: 3 Entrance Stairs-Rails: Right Home Layout: One level     Bathroom Shower/Tub: Teacher, early years/pre: Standard     Home Equipment: Tub bench;Bedside commode;Walker - 2 wheels   Additional Comments: Niece and PCAs A with bathing/dressing  Lives With: Spouse    Prior Functioning/Environment Level of Independence: Needs assistance  Gait / Transfers Assistance Needed: Uses RW ADL's / Homemaking Assistance Needed: assist with bathing, dressing   Comments: Aide 2-3 days/wk; helps with bathing, dressing.         OT Problem List:  Decreased strength;Decreased range of motion;Decreased activity tolerance;Impaired balance (sitting and/or standing);Impaired vision/perception;Decreased coordination;Impaired UE functional use;Pain      OT Treatment/Interventions: Self-care/ADL training;DME and/or AE instruction;Patient/family education;Balance training;Therapeutic exercise    OT Goals(Current goals can be found in the care plan section) Acute Rehab OT Goals Patient Stated Goal: to go home OT Goal Formulation: With patient Time For Goal Achievement: 10/01/19 Potential to Achieve Goals: Good  OT Frequency: Min 2X/week           Co-evaluation PT/OT/SLP Co-Evaluation/Treatment: Yes Reason for Co-Treatment: For patient/therapist safety;To address functional/ADL transfers PT goals addressed during session: Mobility/safety with mobility;Balance;Strengthening/ROM;Proper use of DME OT goals addressed during session: Strengthening/ROM;Proper use of Adaptive equipment and DME;ADL's and self-care      AM-PAC OT "6 Clicks" Daily Activity     Outcome Measure Help from another person eating meals?: A Little Help from another person taking care of personal grooming?: A Little Help from another person toileting, which includes using toliet, bedpan, or urinal?: A Lot Help from another person bathing (including washing, rinsing, drying)?: A Lot Help from another person to put on and taking off regular upper body clothing?: A Lot Help from another person to put on and taking off regular lower body clothing?: A Lot 6 Click Score: 14   End of Session Equipment Utilized During Treatment: Gait belt;Rolling walker Nurse  Communication: Mobility status  Activity Tolerance: Patient tolerated treatment well Patient left: in chair;with call bell/phone within reach;with chair alarm set  OT Visit Diagnosis: Muscle weakness (generalized) (M62.81);Pain;Other symptoms and signs involving cognitive function Pain - Right/Left: Right Pain - part  of body: Shoulder                Time: [id number] OT Time Calculation (min): 24 min Charges:  OT General Charges $OT Visit: 1 Visit OT Evaluation $OT Eval Moderate Complexity: 1 Mod  Cathy  OTR/L Acute [employer] Pager [phone] Office [phone]     09/17/2019, 1:28 PM

## 2019-09-17 NOTE — Progress Notes (Signed)
STROKE TEAM PROGRESS NOTE   INTERVAL HISTORY Patient's husband is at the bedside.  She presented with history of recent syncopal episode which was felt to be related to dehydration.  She also had some right upper extremity weakness when evaluated by EMS.  MRI scan shows small left frontal subcortical subacute infarct.  Patient denies any slurred speech facial weakness or lower extremity weakness.  Vitals:   09/17/19 0038 09/17/19 0328 09/17/19 0834 09/17/19 0842  BP: 121/66 122/62  (!) 98/52  Pulse: (!) 103 98 (!) 101 99  Resp: 19 18 20 16   Temp: 98.8 F (37.1 C) 98.5 F (36.9 C)    TempSrc: Oral Oral    SpO2: 97% 98% 98% 92%  Weight: 48.1 kg     Height: 5\' 5"  (1.651 m)       CBC:  Recent Labs  Lab 09/16/19 1248 09/16/19 1302  WBC 6.2  --   NEUTROABS 3.0  --   HGB 11.0* 12.2  HCT 35.7* 36.0  MCV 92.0  --   PLT 389  --     Basic Metabolic Panel:  Recent Labs  Lab 09/16/19 1248 09/16/19 1248 09/16/19 1302 09/17/19 1017  NA 133*   < > 135 135  K 3.4*   < > 3.3* 3.5  CL 100   < > 100 101  CO2 24  --   --  23  GLUCOSE 133*   < > 125* 108*  BUN 8   < > 8 10  CREATININE 0.72   < > 0.60 1.07*  CALCIUM 8.7*  --   --  8.4*   < > = values in this interval not displayed.   Lipid Panel:     Component Value Date/Time   CHOL 106 09/17/2019 0445   CHOL 368 (H) 08/13/2018 1606   TRIG 190 (H) 09/17/2019 0445   HDL 34 (L) 09/17/2019 0445   HDL 70 08/13/2018 1606   CHOLHDL 3.1 09/17/2019 0445   VLDL 38 09/17/2019 0445   LDLCALC 34 09/17/2019 0445   LDLCALC 243 (H) 08/13/2018 1606   HgbA1c:  Lab Results  Component Value Date   HGBA1C 6.2 (H) 09/17/2019   Urine Drug Screen:     Component Value Date/Time   LABOPIA NONE DETECTED 08/27/2019 0212   COCAINSCRNUR NONE DETECTED 08/27/2019 0212   COCAINSCRNUR Negative 04/08/2019 1455   LABBENZ NONE DETECTED 08/27/2019 0212   AMPHETMU NONE DETECTED 08/27/2019 0212   THCU NONE DETECTED 08/27/2019 0212   LABBARB NONE  DETECTED 08/27/2019 0212    Alcohol Level     Component Value Date/Time   ETH <10 09/16/2019 1248    IMAGING past 48 hours CT Angio Head W or Wo Contrast  Result Date: 09/16/2019 CLINICAL DATA:  Right arm weakness.  Stroke code. EXAM: CT ANGIOGRAPHY HEAD AND NECK CT PERFUSION BRAIN TECHNIQUE: Multidetector CT imaging of the head and neck was performed using the standard protocol during bolus administration of intravenous contrast. Multiplanar CT image reconstructions and MIPs were obtained to evaluate the vascular anatomy. Carotid stenosis measurements (when applicable) are obtained utilizing NASCET criteria, using the distal internal carotid diameter as the denominator. Multiphase CT imaging of the brain was performed following IV bolus contrast injection. Subsequent parametric perfusion maps were calculated using RAPID software. CONTRAST:  122mL OMNIPAQUE IOHEXOL 350 MG/ML SOLN COMPARISON:  Noncontrast head CT performed earlier the same day. FINDINGS: CTA NECK FINDINGS Aortic arch: Common origin of the innominate and left common carotid arteries. No significant atherosclerotic  disease within the visualized aortic arch. No significant innominate or proximal subclavian artery stenosis. Right carotid system: CCA and ICA patent within the neck. Calcified plaque within the proximal ICA results in less than 50% stenosis. Left carotid system: CCA and ICA patent within the neck. Calcified plaque within the proximal ICA results in less than 50% stenosis. Vertebral arteries: The right vertebral artery is slightly dominant. The vertebral arteries are patent within the neck bilaterally without stenosis. Skeleton: No acute bony abnormality. Cervical spondylosis with multilevel posterior disc osteophytes, uncovertebral and facet hypertrophy. Multilevel degenerative fusion within the cervical and upper thoracic spine related to prominent ventrolateral bridging osteophytes. Other neck: No soft tissue neck mass or  cervical lymphadenopathy. Thyroid unremarkable. Upper chest: There is no consolidation within the imaged lung apices. Review of the MIP images confirms the above findings CTA HEAD FINDINGS Anterior circulation: The intracranial internal carotid arteries are patent bilaterally. Mild calcified plaque within the cavernous segments. Mild stenosis within the left cavernous ICA. The M1 middle cerebral arteries are patent bilaterally without significant stenosis. No M2 proximal branch occlusion or high-grade proximal stenosis is identified. The anterior cerebral arteries are patent bilaterally without high-grade proximal stenosis. The A1 right ACA is hypoplastic. No intracranial aneurysm is identified. Posterior circulation: The intracranial vertebral arteries are patent bilaterally. Calcified plaque within the intracranial right vertebral artery with focal mild/moderate stenosis. The basilar artery is patent without significant stenosis. Fetal origin of the right posterior cerebral artery. A small posterior communicating arteries present on the left. The posterior cerebral arteries are patent bilaterally. Moderate to moderately advanced segmental stenosis within the P2 right posterior cerebral artery (series 11, image 21) Venous sinuses: Within limitations of contrast timing, no convincing thrombus. Anatomic variants: As described. Review of the MIP images confirms the above findings CT Brain Perfusion Findings: CBF (<30%) Volume: 35mL Perfusion (Tmax>6.0s) volume: 25mL Mismatch Volume: 71mL Infarction Location:None identified IMPRESSION: CTA neck: 1. The common and internal carotid arteries are patent within the neck. Calcified plaque within the proximal ICAs results in less than 50% stenosis bilaterally. 2. The vertebral arteries are patent within the neck without stenosis. 3. Cervical spondylosis with multilevel posterior disc osteophytes, uncovertebral and facet hypertrophy. There is also multilevel degenerative fusion  within the cervical and upper thoracic spine related to bridging ventral osteophytes. CTA head: 1. No intracranial large vessel occlusion. 2. Mild calcified plaque within the intracranial internal carotid arteries. Mild stenosis of the cavernous left ICA. 3. Calcified plaque results in focal mild/moderate stenosis within the V4 right vertebral artery. 4. Moderate segmental stenosis within the P2 right posterior cerebral artery. CT perfusion head: The CT perfusion software identifies no core infarct. The CT perfusion software identifies no region of critically hypoperfused parenchyma utilizing a Tmax>6 seconds threshold. Electronically Signed   By: Kellie Simmering DO   On: 09/16/2019 13:49   CT Angio Neck W and/or Wo Contrast  Result Date: 09/16/2019 CLINICAL DATA:  Right arm weakness.  Stroke code. EXAM: CT ANGIOGRAPHY HEAD AND NECK CT PERFUSION BRAIN TECHNIQUE: Multidetector CT imaging of the head and neck was performed using the standard protocol during bolus administration of intravenous contrast. Multiplanar CT image reconstructions and MIPs were obtained to evaluate the vascular anatomy. Carotid stenosis measurements (when applicable) are obtained utilizing NASCET criteria, using the distal internal carotid diameter as the denominator. Multiphase CT imaging of the brain was performed following IV bolus contrast injection. Subsequent parametric perfusion maps were calculated using RAPID software. CONTRAST:  138mL OMNIPAQUE IOHEXOL 350 MG/ML SOLN  COMPARISON:  Noncontrast head CT performed earlier the same day. FINDINGS: CTA NECK FINDINGS Aortic arch: Common origin of the innominate and left common carotid arteries. No significant atherosclerotic disease within the visualized aortic arch. No significant innominate or proximal subclavian artery stenosis. Right carotid system: CCA and ICA patent within the neck. Calcified plaque within the proximal ICA results in less than 50% stenosis. Left carotid system: CCA and  ICA patent within the neck. Calcified plaque within the proximal ICA results in less than 50% stenosis. Vertebral arteries: The right vertebral artery is slightly dominant. The vertebral arteries are patent within the neck bilaterally without stenosis. Skeleton: No acute bony abnormality. Cervical spondylosis with multilevel posterior disc osteophytes, uncovertebral and facet hypertrophy. Multilevel degenerative fusion within the cervical and upper thoracic spine related to prominent ventrolateral bridging osteophytes. Other neck: No soft tissue neck mass or cervical lymphadenopathy. Thyroid unremarkable. Upper chest: There is no consolidation within the imaged lung apices. Review of the MIP images confirms the above findings CTA HEAD FINDINGS Anterior circulation: The intracranial internal carotid arteries are patent bilaterally. Mild calcified plaque within the cavernous segments. Mild stenosis within the left cavernous ICA. The M1 middle cerebral arteries are patent bilaterally without significant stenosis. No M2 proximal branch occlusion or high-grade proximal stenosis is identified. The anterior cerebral arteries are patent bilaterally without high-grade proximal stenosis. The A1 right ACA is hypoplastic. No intracranial aneurysm is identified. Posterior circulation: The intracranial vertebral arteries are patent bilaterally. Calcified plaque within the intracranial right vertebral artery with focal mild/moderate stenosis. The basilar artery is patent without significant stenosis. Fetal origin of the right posterior cerebral artery. A small posterior communicating arteries present on the left. The posterior cerebral arteries are patent bilaterally. Moderate to moderately advanced segmental stenosis within the P2 right posterior cerebral artery (series 11, image 21) Venous sinuses: Within limitations of contrast timing, no convincing thrombus. Anatomic variants: As described. Review of the MIP images confirms  the above findings CT Brain Perfusion Findings: CBF (<30%) Volume: 62mL Perfusion (Tmax>6.0s) volume: 65mL Mismatch Volume: 59mL Infarction Location:None identified IMPRESSION: CTA neck: 1. The common and internal carotid arteries are patent within the neck. Calcified plaque within the proximal ICAs results in less than 50% stenosis bilaterally. 2. The vertebral arteries are patent within the neck without stenosis. 3. Cervical spondylosis with multilevel posterior disc osteophytes, uncovertebral and facet hypertrophy. There is also multilevel degenerative fusion within the cervical and upper thoracic spine related to bridging ventral osteophytes. CTA head: 1. No intracranial large vessel occlusion. 2. Mild calcified plaque within the intracranial internal carotid arteries. Mild stenosis of the cavernous left ICA. 3. Calcified plaque results in focal mild/moderate stenosis within the V4 right vertebral artery. 4. Moderate segmental stenosis within the P2 right posterior cerebral artery. CT perfusion head: The CT perfusion software identifies no core infarct. The CT perfusion software identifies no region of critically hypoperfused parenchyma utilizing a Tmax>6 seconds threshold. Electronically Signed   By: Kellie Simmering DO   On: 09/16/2019 13:49   MR BRAIN WO CONTRAST  Result Date: 09/16/2019 CLINICAL DATA:  Follow-up stroke.  Right arm weakness. EXAM: MRI HEAD WITHOUT CONTRAST TECHNIQUE: Multiplanar, multiecho pulse sequences of the brain and surrounding structures were obtained without intravenous contrast. COMPARISON:  CT studies earlier same day FINDINGS: The study suffers from marked motion degradation. Brain: Small (1-2 cm) region of early acute infarction in the left posterior frontal region. No other acute finding. No abnormality seen affecting the brainstem or cerebellum. Mild chronic small-vessel  changes of the hemispheric white matter considering age. No hydrocephalus or extra-axial collection. No sign of  hemorrhage. Vascular: Major vessels at the base of the brain show flow. Skull and upper cervical spine: Negative Sinuses/Orbits: Clear/normal Other: None IMPRESSION: 1-2 cm region of early acute infarction in the left posterior frontal brain. No mass effect or hemorrhage evident. Electronically Signed   By: Nelson Chimes M.D.   On: 09/16/2019 16:27   CT CEREBRAL PERFUSION W CONTRAST  Result Date: 09/16/2019 CLINICAL DATA:  Right arm weakness.  Stroke code. EXAM: CT ANGIOGRAPHY HEAD AND NECK CT PERFUSION BRAIN TECHNIQUE: Multidetector CT imaging of the head and neck was performed using the standard protocol during bolus administration of intravenous contrast. Multiplanar CT image reconstructions and MIPs were obtained to evaluate the vascular anatomy. Carotid stenosis measurements (when applicable) are obtained utilizing NASCET criteria, using the distal internal carotid diameter as the denominator. Multiphase CT imaging of the brain was performed following IV bolus contrast injection. Subsequent parametric perfusion maps were calculated using RAPID software. CONTRAST:  181mL OMNIPAQUE IOHEXOL 350 MG/ML SOLN COMPARISON:  Noncontrast head CT performed earlier the same day. FINDINGS: CTA NECK FINDINGS Aortic arch: Common origin of the innominate and left common carotid arteries. No significant atherosclerotic disease within the visualized aortic arch. No significant innominate or proximal subclavian artery stenosis. Right carotid system: CCA and ICA patent within the neck. Calcified plaque within the proximal ICA results in less than 50% stenosis. Left carotid system: CCA and ICA patent within the neck. Calcified plaque within the proximal ICA results in less than 50% stenosis. Vertebral arteries: The right vertebral artery is slightly dominant. The vertebral arteries are patent within the neck bilaterally without stenosis. Skeleton: No acute bony abnormality. Cervical spondylosis with multilevel posterior disc  osteophytes, uncovertebral and facet hypertrophy. Multilevel degenerative fusion within the cervical and upper thoracic spine related to prominent ventrolateral bridging osteophytes. Other neck: No soft tissue neck mass or cervical lymphadenopathy. Thyroid unremarkable. Upper chest: There is no consolidation within the imaged lung apices. Review of the MIP images confirms the above findings CTA HEAD FINDINGS Anterior circulation: The intracranial internal carotid arteries are patent bilaterally. Mild calcified plaque within the cavernous segments. Mild stenosis within the left cavernous ICA. The M1 middle cerebral arteries are patent bilaterally without significant stenosis. No M2 proximal branch occlusion or high-grade proximal stenosis is identified. The anterior cerebral arteries are patent bilaterally without high-grade proximal stenosis. The A1 right ACA is hypoplastic. No intracranial aneurysm is identified. Posterior circulation: The intracranial vertebral arteries are patent bilaterally. Calcified plaque within the intracranial right vertebral artery with focal mild/moderate stenosis. The basilar artery is patent without significant stenosis. Fetal origin of the right posterior cerebral artery. A small posterior communicating arteries present on the left. The posterior cerebral arteries are patent bilaterally. Moderate to moderately advanced segmental stenosis within the P2 right posterior cerebral artery (series 11, image 21) Venous sinuses: Within limitations of contrast timing, no convincing thrombus. Anatomic variants: As described. Review of the MIP images confirms the above findings CT Brain Perfusion Findings: CBF (<30%) Volume: 100mL Perfusion (Tmax>6.0s) volume: 61mL Mismatch Volume: 43mL Infarction Location:None identified IMPRESSION: CTA neck: 1. The common and internal carotid arteries are patent within the neck. Calcified plaque within the proximal ICAs results in less than 50% stenosis bilaterally.  2. The vertebral arteries are patent within the neck without stenosis. 3. Cervical spondylosis with multilevel posterior disc osteophytes, uncovertebral and facet hypertrophy. There is also multilevel degenerative fusion within the cervical  and upper thoracic spine related to bridging ventral osteophytes. CTA head: 1. No intracranial large vessel occlusion. 2. Mild calcified plaque within the intracranial internal carotid arteries. Mild stenosis of the cavernous left ICA. 3. Calcified plaque results in focal mild/moderate stenosis within the V4 right vertebral artery. 4. Moderate segmental stenosis within the P2 right posterior cerebral artery. CT perfusion head: The CT perfusion software identifies no core infarct. The CT perfusion software identifies no region of critically hypoperfused parenchyma utilizing a Tmax>6 seconds threshold. Electronically Signed   By: Kellie Simmering DO   On: 09/16/2019 13:49   DG Chest Port 1 View  Result Date: 09/16/2019 CLINICAL DATA:  Shortness of breath.  Chest pain. EXAM: PORTABLE CHEST 1 VIEW COMPARISON:  None. FINDINGS: Cardiomegaly. The hila and mediastinum are normal. No pneumothorax. No nodules or masses. No focal infiltrates. IMPRESSION: No active disease. Electronically Signed   By: Dorise Bullion III M.D   On: 09/16/2019 14:24   CT HEAD CODE STROKE WO CONTRAST  Result Date: 09/16/2019 CLINICAL DATA:  Code stroke. EXAM: CT HEAD WITHOUT CONTRAST TECHNIQUE: Contiguous axial images were obtained from the base of the skull through the vertex without intravenous contrast. COMPARISON:  None. FINDINGS: Brain: No evidence of acute infarction, hemorrhage, hydrocephalus, extra-axial collection or mass lesion/mass effect. Hypodensity of the periventricular white matter likely related to chronic small vessel ischemia. Vascular: No hyperdense vessel or unexpected calcification. Skull: Normal. Negative for fracture or focal lesion. Sinuses/Orbits: No acute finding. Other: None.  ASPECTS [facility] Stroke Program Early CT Score) - Ganglionic level infarction (caudate, lentiform nuclei, internal capsule, insula, M1-M3 cortex): 7/7 - Supraganglionic infarction (M4-M6 cortex): 3/3 Total score (0-10 with 10 being normal): 10 IMPRESSION: No acute intracranial findings. Hypodensity of the periventricular white matter likely related to chronic small vessel ischemia. *ASPECTS is 10 *Results communicated to Dr. Leonel Ramsay via pager system. Electronically Signed   By: Pedro Earls M.D.   On: 09/16/2019 13:07    PHYSICAL EXAM Pleasant frail elderly African-American lady not in distress. . Afebrile. Head is nontraumatic. Neck is supple without bruit.    Cardiac exam no murmur or gallop. Lungs are clear to auscultation. Distal pulses are well felt. Neurological Exam :  She is awake alert oriented to time place and person.  There is no dysarthria aphasia or apraxia.  Extraocular movements are full range without nystagmus.  She blinks to threat bilaterally.  Trace right lower facial asymmetry but she is in edentulous so difficult to know if it is true weakness.  Tongue midline motor system exam patient is significant weakness proximally in the right shoulder and hands has a drift but she has good strength at the elbow wrist and intrinsic hand muscles.  No other focal weakness.  Sensation intact.  Gait deferred ASSESSMENT/PLAN Ms. Laronica Mcdaniels is a 84 y.o. female with history of RA, gastroparesis, COPD w/ recent admission for syncope d/t vasovagal and dehydration, confused since d/c. now with R sided weakness and increased confusion.     Stroke:  L MCA posterior frontal lobe white matter infarct likely small vessel disease in the setting of dehydration and recent syncopal event.    Right proximal upper extremity weakness unlikely to be stroke and likely mechanical from shoulder related problems.    Code Stroke CT head No acute abnormality. Periventricular white matter small  vessel disease. ASPECTS 10.     CTA head no LVO. Mild ICA plaque. Mild L ICA stenosis. R VA V4 mild/mod stenosis. R P2  PCA stenosis.   CTA neck proximal B ICA < 50% stenosis. ICA and VA patent. Cervical spondylosis w/ multilevel degenerative fusion    CT perfusion no core infarct. No penumbra.   MRI  1-2cm L posterior frontal early acute infarct   2D Echo 09/03/19 EF 65-70%. No source of embolus   Needs loop to look for AF as source of stroke. Can be done as an OP if d/c prior to Monday. I staff messaged cardiology.  LDL 34  HgbA1c 6.2  Lovenox 40 mg sq daily for VTE prophylaxis  aspirin 81 mg daily prior to admission, now on aspirin 325 mg daily. Change aspirin to 81 and add plavix 75 x 3 weeks then aspirin alone  Therapy recommendations:  HH PT, HH OT   Disposition:  Home w/ husband  Hyperlipidemia  Home meds:  lipitor 10  Now on lipitor 40  LDL 34, goal < 70  Continue statin at discharge  Diabetes type II Controlled  HgbA1c 6.2, goal < 7.0  Other Stroke Risk Factors  Advanced age  Former Cigarette smoker  Hx ETOH use  Migraines  Obstructive sleep apnea, on CPAP at home  Chronic diastolic Congestive heart failure  Other Active Problems  Gastroparesis, chronic N/V  RA on immunosuppression therapy  COPD  Insomnia. Requested sleep med. Will not prescribe narcotic d/t potential to increase confusion.   Cognitive deficit. Needs dementia evaluation. Labs ordered. [facility] to follow up as OP.  Hospital day # 1 I have personally obtained history,examined this patient, reviewed notes, independently viewed imaging studies, participated in medical decision making and plan of care.ROS completed by me personally and pertinent positives fully documented  I have made any additions or clarifications directly to the above note.  Patient is a poor historian and is unclear if the right shoulder weakness is chronic or new but MRI scan shows a tiny punctate left frontal white  matter infarct likely asymptomatic and caused by dehydration and syncopal event.  Recommend dual antiplatelet therapy and may consider outpatient cardiac monitoring for paroxysmal A. fib.  She also has cognitive issues and would recommend dementia panel labs for evaluation.  Long discussion with patient and husband at the bedside and answered questions.  Discussed with Dr. Benny Lennert greater than 50% time during this 35-minute visit was spent on counseling and coordination of care about her stroke and answering questions and discussion with care team  Antony Contras, MD Medical Director [place] Pager: [phone] 09/17/2019 4:38 PM   To contact Stroke Continuity provider, please refer to [email or web address] After hours, contact General Neurology

## 2019-09-18 ENCOUNTER — Inpatient Hospital Stay (HOSPITAL_COMMUNITY): Payer: Medicare Other

## 2019-09-18 DIAGNOSIS — I6389 Other cerebral infarction: Secondary | ICD-10-CM

## 2019-09-18 DIAGNOSIS — I639 Cerebral infarction, unspecified: Secondary | ICD-10-CM

## 2019-09-18 LAB — TSH: TSH: 1.829 u[IU]/mL (ref 0.350–4.500)

## 2019-09-18 LAB — GLUCOSE, CAPILLARY
Glucose-Capillary: 116 mg/dL — ABNORMAL HIGH (ref 70–99)
Glucose-Capillary: 102 mg/dL — ABNORMAL HIGH (ref 70–99)
Glucose-Capillary: 107 mg/dL — ABNORMAL HIGH (ref 70–99)
Glucose-Capillary: 119 mg/dL — ABNORMAL HIGH (ref 70–99)

## 2019-09-18 LAB — RPR: RPR Ser Ql: NONREACTIVE

## 2019-09-18 LAB — ECHOCARDIOGRAM LIMITED
Height: 65 in
Weight: 1696.66 oz

## 2019-09-18 LAB — VITAMIN B12: Vitamin B-12: 194 pg/mL (ref 180–914)

## 2019-09-18 LAB — SEDIMENTATION RATE: Sed Rate: 26 mm/hr — ABNORMAL HIGH (ref 0–22)

## 2019-09-18 MED ORDER — VITAMIN B-12 1000 MCG PO TABS
1000.0000 ug | ORAL_TABLET | Freq: Every day | ORAL | Status: DC
  Administered 2019-09-18 – 2019-09-19 (×2): 1000 ug via ORAL
  Filled 2019-09-18 (×2): qty 1

## 2019-09-18 NOTE — Progress Notes (Signed)
  Echocardiogram 2D Echocardiogram has been performed.  Marybelle Killings 09/18/2019, 11:02 AM

## 2019-09-18 NOTE — Plan of Care (Signed)
Plan of care was reviewed with pt and spouse at bedside. Ns@50ml /hr per orders. Pt on telemetry monitoring. Bed low in locked position, fall bundle in place. Call bell in reach. Will continue to monitor pt.  Problem: Education: Goal: Knowledge of disease or condition will improve Outcome: Progressing Goal: Knowledge of secondary prevention will improve Outcome: Progressing Goal: Knowledge of patient specific risk factors addressed and post discharge goals established will improve Outcome: Progressing Goal: Individualized Educational Video(s) Outcome: Progressing   Problem: Coping: Goal: Will verbalize positive feelings about self Outcome: Progressing Goal: Will identify appropriate support needs Outcome: Progressing   Problem: Health Behavior/Discharge Planning: Goal: Ability to manage health-related needs will improve Outcome: Progressing   Problem: Self-Care: Goal: Ability to participate in self-care as condition permits will improve Outcome: Progressing Goal: Verbalization of feelings and concerns over difficulty with self-care will improve Outcome: Progressing Goal: Ability to communicate needs accurately will improve Outcome: Progressing   Problem: Nutrition: Goal: Risk of aspiration will decrease Outcome: Progressing Goal: Dietary intake will improve Outcome: Progressing   Problem: Ischemic Stroke/TIA Tissue Perfusion: Goal: Complications of ischemic stroke/TIA will be minimized Outcome: Progressing   Problem: Education: Goal: Knowledge of General Education information will improve Description: Including pain rating scale, medication(s)/side effects and non-pharmacologic comfort measures Outcome: Progressing   Problem: Health Behavior/Discharge Planning: Goal: Ability to manage health-related needs will improve Outcome: Progressing   Problem: Clinical Measurements: Goal: Ability to maintain clinical measurements within normal limits will improve Outcome:  Progressing Goal: Will remain free from infection Outcome: Progressing Goal: Diagnostic test results will improve Outcome: Progressing Goal: Respiratory complications will improve Outcome: Progressing Goal: Cardiovascular complication will be avoided Outcome: Progressing   Problem: Activity: Goal: Risk for activity intolerance will decrease Outcome: Progressing   Problem: Nutrition: Goal: Adequate nutrition will be maintained Outcome: Progressing   Problem: Coping: Goal: Level of anxiety will decrease Outcome: Progressing   Problem: Elimination: Goal: Will not experience complications related to bowel motility Outcome: Progressing Goal: Will not experience complications related to urinary retention Outcome: Progressing   Problem: Pain Managment: Goal: General experience of comfort will improve Outcome: Progressing   Problem: Safety: Goal: Ability to remain free from injury will improve Outcome: Progressing   Problem: Skin Integrity: Goal: Risk for impaired skin integrity will decrease Outcome: Progressing

## 2019-09-18 NOTE — Progress Notes (Signed)
STROKE TEAM PROGRESS NOTE   INTERVAL HISTORY No family is at bedside. Pt awake alert and asking for nursing assistance for lunch. She is neurologically stable, orientated to place and age, but not to time or situation. She has no focal deficit except Right deltoid 1/5 with chronic right shoulder arthritis.   Vitals:   09/17/19 1918 09/17/19 1953 09/18/19 0001 09/18/19 0349  BP: 112/65  126/61 130/64  Pulse: (!) 102  (!) 112 (!) 118  Resp: 20  17 16   Temp: 98.7 F (37.1 C)  98.4 F (36.9 C) 99.2 F (37.3 C)  TempSrc:   Oral Oral  SpO2: 98% 99% 98% 97%  Weight:      Height:        CBC:  Recent Labs  Lab 09/16/19 1248 09/16/19 1302  WBC 6.2  --   NEUTROABS 3.0  --   HGB 11.0* 12.2  HCT 35.7* 36.0  MCV 92.0  --   PLT 389  --     Basic Metabolic Panel:  Recent Labs  Lab 09/16/19 1248 09/16/19 1248 09/16/19 1302 09/17/19 1017  NA 133*   < > 135 135  K 3.4*   < > 3.3* 3.5  CL 100   < > 100 101  CO2 24  --   --  23  GLUCOSE 133*   < > 125* 108*  BUN 8   < > 8 10  CREATININE 0.72   < > 0.60 1.07*  CALCIUM 8.7*  --   --  8.4*   < > = values in this interval not displayed.   Lipid Panel:     Component Value Date/Time   CHOL 106 09/17/2019 0445   CHOL 368 (H) 08/13/2018 1606   TRIG 190 (H) 09/17/2019 0445   HDL 34 (L) 09/17/2019 0445   HDL 70 08/13/2018 1606   CHOLHDL 3.1 09/17/2019 0445   VLDL 38 09/17/2019 0445   LDLCALC 34 09/17/2019 0445   LDLCALC 243 (H) 08/13/2018 1606   HgbA1c:  Lab Results  Component Value Date   HGBA1C 6.2 (H) 09/17/2019   Urine Drug Screen:     Component Value Date/Time   LABOPIA NONE DETECTED 08/27/2019 0212   COCAINSCRNUR NONE DETECTED 08/27/2019 0212   COCAINSCRNUR Negative 04/08/2019 1455   LABBENZ NONE DETECTED 08/27/2019 0212   AMPHETMU NONE DETECTED 08/27/2019 0212   THCU NONE DETECTED 08/27/2019 0212   LABBARB NONE DETECTED 08/27/2019 0212    Alcohol Level     Component Value Date/Time   ETH <10 09/16/2019 1248     IMAGING past 48 hours CT Angio Head W or Wo Contrast  Result Date: 09/16/2019 CLINICAL DATA:  Right arm weakness.  Stroke code. EXAM: CT ANGIOGRAPHY HEAD AND NECK CT PERFUSION BRAIN TECHNIQUE: Multidetector CT imaging of the head and neck was performed using the standard protocol during bolus administration of intravenous contrast. Multiplanar CT image reconstructions and MIPs were obtained to evaluate the vascular anatomy. Carotid stenosis measurements (when applicable) are obtained utilizing NASCET criteria, using the distal internal carotid diameter as the denominator. Multiphase CT imaging of the brain was performed following IV bolus contrast injection. Subsequent parametric perfusion maps were calculated using RAPID software. CONTRAST:  135mL OMNIPAQUE IOHEXOL 350 MG/ML SOLN COMPARISON:  Noncontrast head CT performed earlier the same day. FINDINGS: CTA NECK FINDINGS Aortic arch: Common origin of the innominate and left common carotid arteries. No significant atherosclerotic disease within the visualized aortic arch. No significant innominate or proximal subclavian artery stenosis.  Right carotid system: CCA and ICA patent within the neck. Calcified plaque within the proximal ICA results in less than 50% stenosis. Left carotid system: CCA and ICA patent within the neck. Calcified plaque within the proximal ICA results in less than 50% stenosis. Vertebral arteries: The right vertebral artery is slightly dominant. The vertebral arteries are patent within the neck bilaterally without stenosis. Skeleton: No acute bony abnormality. Cervical spondylosis with multilevel posterior disc osteophytes, uncovertebral and facet hypertrophy. Multilevel degenerative fusion within the cervical and upper thoracic spine related to prominent ventrolateral bridging osteophytes. Other neck: No soft tissue neck mass or cervical lymphadenopathy. Thyroid unremarkable. Upper chest: There is no consolidation within the imaged  lung apices. Review of the MIP images confirms the above findings CTA HEAD FINDINGS Anterior circulation: The intracranial internal carotid arteries are patent bilaterally. Mild calcified plaque within the cavernous segments. Mild stenosis within the left cavernous ICA. The M1 middle cerebral arteries are patent bilaterally without significant stenosis. No M2 proximal branch occlusion or high-grade proximal stenosis is identified. The anterior cerebral arteries are patent bilaterally without high-grade proximal stenosis. The A1 right ACA is hypoplastic. No intracranial aneurysm is identified. Posterior circulation: The intracranial vertebral arteries are patent bilaterally. Calcified plaque within the intracranial right vertebral artery with focal mild/moderate stenosis. The basilar artery is patent without significant stenosis. Fetal origin of the right posterior cerebral artery. A small posterior communicating arteries present on the left. The posterior cerebral arteries are patent bilaterally. Moderate to moderately advanced segmental stenosis within the P2 right posterior cerebral artery (series 11, image 21) Venous sinuses: Within limitations of contrast timing, no convincing thrombus. Anatomic variants: As described. Review of the MIP images confirms the above findings CT Brain Perfusion Findings: CBF (<30%) Volume: 32mL Perfusion (Tmax>6.0s) volume: 33mL Mismatch Volume: 57mL Infarction Location:None identified IMPRESSION: CTA neck: 1. The common and internal carotid arteries are patent within the neck. Calcified plaque within the proximal ICAs results in less than 50% stenosis bilaterally. 2. The vertebral arteries are patent within the neck without stenosis. 3. Cervical spondylosis with multilevel posterior disc osteophytes, uncovertebral and facet hypertrophy. There is also multilevel degenerative fusion within the cervical and upper thoracic spine related to bridging ventral osteophytes. CTA head: 1. No  intracranial large vessel occlusion. 2. Mild calcified plaque within the intracranial internal carotid arteries. Mild stenosis of the cavernous left ICA. 3. Calcified plaque results in focal mild/moderate stenosis within the V4 right vertebral artery. 4. Moderate segmental stenosis within the P2 right posterior cerebral artery. CT perfusion head: The CT perfusion software identifies no core infarct. The CT perfusion software identifies no region of critically hypoperfused parenchyma utilizing a Tmax>6 seconds threshold. Electronically Signed   By: Kellie Simmering DO   On: 09/16/2019 13:49   CT Angio Neck W and/or Wo Contrast  Result Date: 09/16/2019 CLINICAL DATA:  Right arm weakness.  Stroke code. EXAM: CT ANGIOGRAPHY HEAD AND NECK CT PERFUSION BRAIN TECHNIQUE: Multidetector CT imaging of the head and neck was performed using the standard protocol during bolus administration of intravenous contrast. Multiplanar CT image reconstructions and MIPs were obtained to evaluate the vascular anatomy. Carotid stenosis measurements (when applicable) are obtained utilizing NASCET criteria, using the distal internal carotid diameter as the denominator. Multiphase CT imaging of the brain was performed following IV bolus contrast injection. Subsequent parametric perfusion maps were calculated using RAPID software. CONTRAST:  145mL OMNIPAQUE IOHEXOL 350 MG/ML SOLN COMPARISON:  Noncontrast head CT performed earlier the same day. FINDINGS: CTA NECK FINDINGS  Aortic arch: Common origin of the innominate and left common carotid arteries. No significant atherosclerotic disease within the visualized aortic arch. No significant innominate or proximal subclavian artery stenosis. Right carotid system: CCA and ICA patent within the neck. Calcified plaque within the proximal ICA results in less than 50% stenosis. Left carotid system: CCA and ICA patent within the neck. Calcified plaque within the proximal ICA results in less than 50%  stenosis. Vertebral arteries: The right vertebral artery is slightly dominant. The vertebral arteries are patent within the neck bilaterally without stenosis. Skeleton: No acute bony abnormality. Cervical spondylosis with multilevel posterior disc osteophytes, uncovertebral and facet hypertrophy. Multilevel degenerative fusion within the cervical and upper thoracic spine related to prominent ventrolateral bridging osteophytes. Other neck: No soft tissue neck mass or cervical lymphadenopathy. Thyroid unremarkable. Upper chest: There is no consolidation within the imaged lung apices. Review of the MIP images confirms the above findings CTA HEAD FINDINGS Anterior circulation: The intracranial internal carotid arteries are patent bilaterally. Mild calcified plaque within the cavernous segments. Mild stenosis within the left cavernous ICA. The M1 middle cerebral arteries are patent bilaterally without significant stenosis. No M2 proximal branch occlusion or high-grade proximal stenosis is identified. The anterior cerebral arteries are patent bilaterally without high-grade proximal stenosis. The A1 right ACA is hypoplastic. No intracranial aneurysm is identified. Posterior circulation: The intracranial vertebral arteries are patent bilaterally. Calcified plaque within the intracranial right vertebral artery with focal mild/moderate stenosis. The basilar artery is patent without significant stenosis. Fetal origin of the right posterior cerebral artery. A small posterior communicating arteries present on the left. The posterior cerebral arteries are patent bilaterally. Moderate to moderately advanced segmental stenosis within the P2 right posterior cerebral artery (series 11, image 21) Venous sinuses: Within limitations of contrast timing, no convincing thrombus. Anatomic variants: As described. Review of the MIP images confirms the above findings CT Brain Perfusion Findings: CBF (<30%) Volume: 48mL Perfusion (Tmax>6.0s)  volume: 10mL Mismatch Volume: 25mL Infarction Location:None identified IMPRESSION: CTA neck: 1. The common and internal carotid arteries are patent within the neck. Calcified plaque within the proximal ICAs results in less than 50% stenosis bilaterally. 2. The vertebral arteries are patent within the neck without stenosis. 3. Cervical spondylosis with multilevel posterior disc osteophytes, uncovertebral and facet hypertrophy. There is also multilevel degenerative fusion within the cervical and upper thoracic spine related to bridging ventral osteophytes. CTA head: 1. No intracranial large vessel occlusion. 2. Mild calcified plaque within the intracranial internal carotid arteries. Mild stenosis of the cavernous left ICA. 3. Calcified plaque results in focal mild/moderate stenosis within the V4 right vertebral artery. 4. Moderate segmental stenosis within the P2 right posterior cerebral artery. CT perfusion head: The CT perfusion software identifies no core infarct. The CT perfusion software identifies no region of critically hypoperfused parenchyma utilizing a Tmax>6 seconds threshold. Electronically Signed   By: Kellie Simmering DO   On: 09/16/2019 13:49   MR BRAIN WO CONTRAST  Result Date: 09/16/2019 CLINICAL DATA:  Follow-up stroke.  Right arm weakness. EXAM: MRI HEAD WITHOUT CONTRAST TECHNIQUE: Multiplanar, multiecho pulse sequences of the brain and surrounding structures were obtained without intravenous contrast. COMPARISON:  CT studies earlier same day FINDINGS: The study suffers from marked motion degradation. Brain: Small (1-2 cm) region of early acute infarction in the left posterior frontal region. No other acute finding. No abnormality seen affecting the brainstem or cerebellum. Mild chronic small-vessel changes of the hemispheric white matter considering age. No hydrocephalus or extra-axial collection. No  sign of hemorrhage. Vascular: Major vessels at the base of the brain show flow. Skull and upper  cervical spine: Negative Sinuses/Orbits: Clear/normal Other: None IMPRESSION: 1-2 cm region of early acute infarction in the left posterior frontal brain. No mass effect or hemorrhage evident. Electronically Signed   By: Nelson Chimes M.D.   On: 09/16/2019 16:27   CT CEREBRAL PERFUSION W CONTRAST  Result Date: 09/16/2019 CLINICAL DATA:  Right arm weakness.  Stroke code. EXAM: CT ANGIOGRAPHY HEAD AND NECK CT PERFUSION BRAIN TECHNIQUE: Multidetector CT imaging of the head and neck was performed using the standard protocol during bolus administration of intravenous contrast. Multiplanar CT image reconstructions and MIPs were obtained to evaluate the vascular anatomy. Carotid stenosis measurements (when applicable) are obtained utilizing NASCET criteria, using the distal internal carotid diameter as the denominator. Multiphase CT imaging of the brain was performed following IV bolus contrast injection. Subsequent parametric perfusion maps were calculated using RAPID software. CONTRAST:  126mL OMNIPAQUE IOHEXOL 350 MG/ML SOLN COMPARISON:  Noncontrast head CT performed earlier the same day. FINDINGS: CTA NECK FINDINGS Aortic arch: Common origin of the innominate and left common carotid arteries. No significant atherosclerotic disease within the visualized aortic arch. No significant innominate or proximal subclavian artery stenosis. Right carotid system: CCA and ICA patent within the neck. Calcified plaque within the proximal ICA results in less than 50% stenosis. Left carotid system: CCA and ICA patent within the neck. Calcified plaque within the proximal ICA results in less than 50% stenosis. Vertebral arteries: The right vertebral artery is slightly dominant. The vertebral arteries are patent within the neck bilaterally without stenosis. Skeleton: No acute bony abnormality. Cervical spondylosis with multilevel posterior disc osteophytes, uncovertebral and facet hypertrophy. Multilevel degenerative fusion within the  cervical and upper thoracic spine related to prominent ventrolateral bridging osteophytes. Other neck: No soft tissue neck mass or cervical lymphadenopathy. Thyroid unremarkable. Upper chest: There is no consolidation within the imaged lung apices. Review of the MIP images confirms the above findings CTA HEAD FINDINGS Anterior circulation: The intracranial internal carotid arteries are patent bilaterally. Mild calcified plaque within the cavernous segments. Mild stenosis within the left cavernous ICA. The M1 middle cerebral arteries are patent bilaterally without significant stenosis. No M2 proximal branch occlusion or high-grade proximal stenosis is identified. The anterior cerebral arteries are patent bilaterally without high-grade proximal stenosis. The A1 right ACA is hypoplastic. No intracranial aneurysm is identified. Posterior circulation: The intracranial vertebral arteries are patent bilaterally. Calcified plaque within the intracranial right vertebral artery with focal mild/moderate stenosis. The basilar artery is patent without significant stenosis. Fetal origin of the right posterior cerebral artery. A small posterior communicating arteries present on the left. The posterior cerebral arteries are patent bilaterally. Moderate to moderately advanced segmental stenosis within the P2 right posterior cerebral artery (series 11, image 21) Venous sinuses: Within limitations of contrast timing, no convincing thrombus. Anatomic variants: As described. Review of the MIP images confirms the above findings CT Brain Perfusion Findings: CBF (<30%) Volume: 67mL Perfusion (Tmax>6.0s) volume: 25mL Mismatch Volume: 53mL Infarction Location:None identified IMPRESSION: CTA neck: 1. The common and internal carotid arteries are patent within the neck. Calcified plaque within the proximal ICAs results in less than 50% stenosis bilaterally. 2. The vertebral arteries are patent within the neck without stenosis. 3. Cervical  spondylosis with multilevel posterior disc osteophytes, uncovertebral and facet hypertrophy. There is also multilevel degenerative fusion within the cervical and upper thoracic spine related to bridging ventral osteophytes. CTA head: 1. No intracranial  large vessel occlusion. 2. Mild calcified plaque within the intracranial internal carotid arteries. Mild stenosis of the cavernous left ICA. 3. Calcified plaque results in focal mild/moderate stenosis within the V4 right vertebral artery. 4. Moderate segmental stenosis within the P2 right posterior cerebral artery. CT perfusion head: The CT perfusion software identifies no core infarct. The CT perfusion software identifies no region of critically hypoperfused parenchyma utilizing a Tmax>6 seconds threshold. Electronically Signed   By: Kellie Simmering DO   On: 09/16/2019 13:49   DG Chest Port 1 View  Result Date: 09/16/2019 CLINICAL DATA:  Shortness of breath.  Chest pain. EXAM: PORTABLE CHEST 1 VIEW COMPARISON:  None. FINDINGS: Cardiomegaly. The hila and mediastinum are normal. No pneumothorax. No nodules or masses. No focal infiltrates. IMPRESSION: No active disease. Electronically Signed   By: Dorise Bullion III M.D   On: 09/16/2019 14:24   CT HEAD CODE STROKE WO CONTRAST  Result Date: 09/16/2019 CLINICAL DATA:  Code stroke. EXAM: CT HEAD WITHOUT CONTRAST TECHNIQUE: Contiguous axial images were obtained from the base of the skull through the vertex without intravenous contrast. COMPARISON:  None. FINDINGS: Brain: No evidence of acute infarction, hemorrhage, hydrocephalus, extra-axial collection or mass lesion/mass effect. Hypodensity of the periventricular white matter likely related to chronic small vessel ischemia. Vascular: No hyperdense vessel or unexpected calcification. Skull: Normal. Negative for fracture or focal lesion. Sinuses/Orbits: No acute finding. Other: None. ASPECTS [facility] Stroke Program Early CT Score) - Ganglionic level infarction  (caudate, lentiform nuclei, internal capsule, insula, M1-M3 cortex): 7/7 - Supraganglionic infarction (M4-M6 cortex): 3/3 Total score (0-10 with 10 being normal): 10 IMPRESSION: No acute intracranial findings. Hypodensity of the periventricular white matter likely related to chronic small vessel ischemia. *ASPECTS is 10 *Results communicated to Dr. Leonel Ramsay via pager system. Electronically Signed   By: Pedro Earls M.D.   On: 09/16/2019 13:07    PHYSICAL EXAM  Temp:  [98.4 F (36.9 C)-99.2 F (37.3 C)] (P) 99.1 F (37.3 C) (02/20 1155) Pulse Rate:  [102-118] (P) 106 (02/20 1155) Resp:  [16-20] (P) 18 (02/20 1155) BP: (112-141)/(61-83) (P) 134/75 (02/20 1155) SpO2:  [97 %-100 %] (P) 100 % (02/20 1155)  Pleasant frail elderly African-American lady not in distress. Afebrile. Head is nontraumatic. Neck is supple without bruit.    Cardiac exam no murmur or gallop. Lungs are clear to auscultation. Distal pulses are well felt.  Neurological Exam :  She is awake alert oriented to place, self and age, not orientated to time or situation.  There is no dysarthria aphasia or apraxia.  Extraocular movements are full range without nystagmus.  She blinks to threat bilaterally. Facial symmetrical, poor denture.  Tongue midline motor system exam patient is significant weakness proximally in the right shoulder due to chronic arthritis. Equal strength at the elbow wrist and intrinsic hand muscles.  No other focal weakness.  Sensation intact.  Gait deferred.   ASSESSMENT/PLAN [name] is a 84 y.o. female with history of RA, gastroparesis, COPD w/ recent admission for syncope d/t vasovagal and dehydration, confused since d/c. now with R sided weakness and increased confusion.    Stroke, incidental finding - possible L MCA posterior frontal lobe WM punctate infarct likely small vessel disease in the setting of dehydration and recent syncopal event.    CT head No acute abnormality.  Periventricular white matter small vessel disease.   CTA head no LVO. Mild ICA plaque. Mild L ICA stenosis. R VA V4 mild/mod stenosis. R P2  PCA stenosis.   CTA neck proximal B ICA < 50% stenosis. ICA and VA patent. Cervical spondylosis w/ multilevel degenerative fusion    CT perfusion no core infarct. No penumbra.   MRI 08/16/19 - 1-2 cm region of early acute infarction in the left posterior frontal brain, however, DWI signal was vague, not very convinced. No mass effect or hemorrhage evident.  2D Echo 09/03/19 EF 65-70%. No source of embolus   May consider outpt loop to look for AF as source of stroke as arranged by Dr. Leonie Man with cardiology.  LDL 34  HgbA1c 6.2  Lovenox 40 mg sq daily for VTE prophylaxis  aspirin 81 mg daily prior to admission, aspirin 81 and add plavix 75 x 3 weeks then aspirin alone  Therapy recommendations:  HH PT, HH OT   Disposition:  Home w/ husband  RA with right shoulder weakness  Right UE proximal weakness, however equal strength, right bicep tricep and hand grip  Right shoulder weakness likely due to chronic arthritis  Follow up with PCP  Confusion   Pt not orientated to time or situation  Likely MCI from vascular etiology  Dementia labs showed low B12 = 194, but normal TSH, RPR  Add B12 supplement  Follow up as outpt  Hyperlipidemia  Home meds:  lipitor 10  Now on lipitor 20  LDL 34, goal < 70  Continue statin at discharge  Diabetes type II Controlled  HgbA1c 6.2, goal < 7.0  SSI  CBG monitoring  Other Stroke Risk Factors  Advanced age  Former Cigarette smoker  Hx ETOH use  Migraines  Obstructive sleep apnea, on CPAP at home  Chronic diastolic Congestive heart failure  Other Active Problems  Gastroparesis, chronic N/V  COPD  Creatinine elevation - 0.60->1.07 on IVF, encourage po intake  Hospital day # 2  Neurology will sign off. Please call with questions. Pt will follow up with stroke clinic NP at [facility]  in about 4 weeks. Thanks for the consult.  Rosalin Hawking, MD PhD Stroke Neurology 09/18/2019 5:56 PM   To contact Stroke Continuity provider, please refer to [email or web address] After hours, contact General Neurology

## 2019-09-18 NOTE — Progress Notes (Addendum)
PROGRESS NOTE  [name] [id number] DOB: 07-17-1936 DOA: 09/16/2019 PCP: [name], PA-C  Brief History    [name] is a 84 y.o. female with medical history significant of DM; OSA on BIPAP; HTN; HLD; COPD; RA on chronic immunosuppression; and chronic diastolic CHF presenting withAMS.  Her husband reported confusion for a few days and right-sided weakness today.  Her niece was thinking this might be related to her new medications (Bentyl).  The patient complains of pain all over her body.  She was last admitted from 1/29-2/5 with syncope; this was thought to be related to a vasovagal episode with dehydration.  She has had 5 admissions and 3 ER visits in the last 6 months.  She had an echo on 2/5 with normal EF and asymmetric basal septal hypertrophy - ? HOCM vs. Amyloidosis.  Probable CVA.  She has had AMS since last d/c but worse the last few days. Noted to have RUE paralysis.  CT ok, neuro is following and recommending MRI.  [facility] were consulted to admit the patient for further evaluation and treatment.  She was admitted to a telemetry bed. Neurology was consulted. MRI was performed and demonstrated 1-2 cm infarct of the left posterior frontal brain. There was no mass effect or hemorrhage evident.   Neurology has been consulted and the patient has been evaluated by Dr. Rodney Booze. He has recommended a stroke work up. Echo has demonstrated nl EF with hyperdynamic myocardium with no wall motion abnormalities. 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 Right ventricle is normal with normal RV systolic pressure. No intracardiac thrombus or intra-atrial shunt observed.  Consultants  . Neurology  Procedures  . None  Antibiotics   Anti-infectives (From admission, onward)   None      Subjective  The patient is sitting up at bedside. No new complaints.  Objective   Vitals:  Vitals:   09/18/19 0812  09/18/19 1155  BP: (!) 141/83 (P) 134/75  Pulse: (!) 105 (!) (P) 106  Resp: 18 (P) 18  Temp: 98.8 F (37.1 C) (P) 99.1 F (37.3 C)  SpO2: 97% (P) 100%   Exam:  Constitutional:  . The patient is awake, alert, and oriented x 3. No acute distress. Respiratory:  . No increased work of breathing. . No wheezes, rales, or rhonchi . No tactile fremitus Cardiovascular:  . Regular rate and rhythm . No murmurs, ectopy, or gallups. . No lateral PMI. No thrills. Abdomen:  . Abdomen is soft, non-tender, non-distended . No hernias, masses, or organomegaly . Normoactive bowel sounds.  Musculoskeletal:  . No cyanosis, clubbing, or edema Skin:  . No rashes, lesions, ulcers . palpation of skin: no induration or nodules Neurologic:  . CN 2-12 intact . Sensation all 4 extremities intact Psychiatric:  . Mental status o Mood, affect appropriate o Orientation to person, place, time  . judgment and insight appear intact  I have personally reviewed the following:   Today's Data  . Vitals, BMP, Lipid panel, HbA1c  Imaging  . MRI . CXR . CTA head and Neck  Cardiology Data  . Echocardiogram  Scheduled Meds: . aspirin EC  81 mg Oral Daily  . atorvastatin  20 mg Oral q1800  . clopidogrel  75 mg Oral Daily  . docusate sodium  100 mg Oral BID  . enoxaparin (LOVENOX) injection  40 mg Subcutaneous Q24H  . feeding supplement (ENSURE ENLIVE)  237 mL Oral TID BM  . insulin aspart  0-15  Units Subcutaneous TID WC  . insulin aspart  0-5 Units Subcutaneous QHS  . magnesium chloride  1 tablet Oral BID  . metoCLOPramide  5 mg Oral TID AC  . mometasone-formoterol  2 puff Inhalation BID  . multivitamin with minerals  1 tablet Oral Daily  . pantoprazole  40 mg Oral Daily  . polyethylene glycol  17 g Oral Daily  . vitamin B-12  1,000 mcg Oral Daily   Continuous Infusions: . sodium chloride 50 mL/hr at 09/17/19 0327    Principal Problem:   Acute ischemic stroke [facility]) Active Problems:    Rheumatoid arthritis ([place])   COPD (chronic obstructive pulmonary disease) (H[facility])   Uncontrolled type 2 diabetes mellitus with complication (HCC)   Chronic diastolic CHF (congestive heart failure) ([place])   Essential hypertension   Protein-calorie malnutrition, severe   LOS: 2 days   A & P  Acute 1-2 cm infarct of the left posterior frontal brain: The patient has been admitted to a telemetry bed. Neurology has been consulted. Echocardiogram is pending. MRI confirmed CVA. CTA neck demonstrated less than 50% stenosis bilaterally due to calcified plaque within the proximal ICA. Vertebral arteries are patent without stenosis. Cervical spondylosis with multilevel posteroir disc osteophytes. CTA head demonstrated no intracranial large vessel occlusion. There is mild stenosis of the cavernous left ICA. There is focal mild/moderate stenosis within the V4 right vertebral artery due to calcified plaque. There is moderate ssegmental stenosis within the P2 right posterior cerebral artery. CT perfusion of the head has identified no core infarct and no region of critically hypoperfused parenchyma. Echocardiogram  demonstrated nl EF with hyperdynamic myocardium with no wall motion abnormalities. 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 Right ventricle is normal with normal RV systolic pressure. No intracardiac thrombus or intra-atrial shunt observed. PT/OT has evaluated the patient and recommends discharge to home with home health PT and a 3-1 commode.Recommendation from neurology is discharge on plavix and ASA for 3 weeks with discontinuation of Aspirin after that. Follow up with Stroke clinic in 6 weeks.   HCOM per echocardiogram: Consult cardiology to evaluate prior to discharge.  Hypertension: Permissive hypertension for now. Restart Coreg in 24-48 hours.  Hyperlipidemia: Increase lipitor to 40 mg daily  DM II: Glucoses are covered with FSBS and SSI. Will recheck HbA1c. No  medication for DM II at home.  Diabetic gastroparesis with chronic nausea and vomiting: Chronic gastritis. Continue protonix, miralax, and colace as well as bentyl.  Rheumatoid Arthritis: Places the patient at high risk of cardiovascular disease. Plaquenil and daily methylprednisolone have recently been discontinued.   COPD: Noted and stable. Continue albuterol and symbicort.  Chronic diastolic CHF: The patient appears euvolemic. She is 3L positive since admission.  I have seen and examined this patient myself. I have spent 32 minutes in her evaluation and care.  DVT prophylaxis:Lovenox  Code Status:DNR - confirmed with patient Family Communication:None present. Disposition Plan:Home once.   Serenidy Waltz, DO [facility] Direct contact: see [email or web address]  7PM-7AM contact night coverage as above 09/18/2019, 4:10 PM  LOS: 1 day

## 2019-09-19 LAB — CBC WITH DIFFERENTIAL/PLATELET
Abs Immature Granulocytes: 0.04 10*3/uL (ref 0.00–0.07)
Basophils Absolute: 0 10*3/uL (ref 0.0–0.1)
Basophils Relative: 1 %
Eosinophils Absolute: 0.1 10*3/uL (ref 0.0–0.5)
Eosinophils Relative: 1 %
HCT: 30.9 % — ABNORMAL LOW (ref 36.0–46.0)
Hemoglobin: 9.5 g/dL — ABNORMAL LOW (ref 12.0–15.0)
Immature Granulocytes: 1 %
Lymphocytes Relative: 33 %
Lymphs Abs: 1.9 10*3/uL (ref 0.7–4.0)
MCH: 28.3 pg (ref 26.0–34.0)
MCHC: 30.7 g/dL (ref 30.0–36.0)
MCV: 92 fL (ref 80.0–100.0)
Monocytes Absolute: 0.6 10*3/uL (ref 0.1–1.0)
Monocytes Relative: 11 %
Neutro Abs: 3 10*3/uL (ref 1.7–7.7)
Neutrophils Relative %: 53 %
Platelets: 276 10*3/uL (ref 150–400)
RBC: 3.36 MIL/uL — ABNORMAL LOW (ref 3.87–5.11)
RDW: 15.6 % — ABNORMAL HIGH (ref 11.5–15.5)
WBC: 5.7 10*3/uL (ref 4.0–10.5)
nRBC: 0 % (ref 0.0–0.2)

## 2019-09-19 LAB — GLUCOSE, CAPILLARY: Glucose-Capillary: 113 mg/dL — ABNORMAL HIGH (ref 70–99)

## 2019-09-19 LAB — HIV ANTIBODY (ROUTINE TESTING W REFLEX): HIV Screen 4th Generation wRfx: NONREACTIVE — AB

## 2019-09-19 LAB — BASIC METABOLIC PANEL
Anion gap: 9 (ref 5–15)
BUN: 7 mg/dL — ABNORMAL LOW (ref 8–23)
CO2: 22 mmol/L (ref 22–32)
Calcium: 7.9 mg/dL — ABNORMAL LOW (ref 8.9–10.3)
Chloride: 110 mmol/L (ref 98–111)
Creatinine, Ser: 0.66 mg/dL (ref 0.44–1.00)
GFR calc Af Amer: >60 mL/min (ref >60)
GFR calc non Af Amer: >60 mL/min (ref >60)
Glucose, Bld: 145 mg/dL — ABNORMAL HIGH (ref 70–99)
Potassium: 3.5 mmol/L (ref 3.5–5.1)
Sodium: 141 mmol/L (ref 135–145)

## 2019-09-19 MED ORDER — ENSURE ENLIVE PO LIQD: 237.0000 mL | Freq: Three times a day (TID) | ORAL | 12 refills | Status: AC

## 2019-09-19 MED ORDER — CYANOCOBALAMIN 1000 MCG PO TABS: 1000.0000 ug | ORAL_TABLET | Freq: Every day | ORAL | 0 refills | Status: AC

## 2019-09-19 MED ORDER — ADULT MULTIVITAMIN W/MINERALS CH: 1.0000 | ORAL_TABLET | Freq: Every day | ORAL | 0 refills | Status: AC

## 2019-09-19 MED ORDER — ATORVASTATIN CALCIUM 20 MG PO TABS: 20.0000 mg | ORAL_TABLET | Freq: Every day | ORAL | 0 refills | Status: AC

## 2019-09-19 MED ORDER — CLOPIDOGREL BISULFATE 75 MG PO TABS: 75.0000 mg | ORAL_TABLET | Freq: Every day | ORAL | 0 refills | Status: AC

## 2019-09-19 MED ORDER — METOPROLOL TARTRATE 25 MG PO TABS: 25.0000 mg | ORAL_TABLET | Freq: Two times a day (BID) | ORAL | 11 refills | Status: DC

## 2019-09-19 NOTE — Plan of Care (Signed)
Plan of care reviewed with pt at bedside. Pt will DC home with husband. Waiting for SW/CM to follow up with [facility]. Cardiac monitoring dc. Pt denies needs at this time. Education provided. Will continue to monitor pt until dc.  Problem: Education: Goal: Knowledge of disease or condition will improve Outcome: Progressing Goal: Knowledge of secondary prevention will improve Outcome: Progressing Goal: Knowledge of patient specific risk factors addressed and post discharge goals established will improve Outcome: Progressing Goal: Individualized Educational Video(s) Outcome: Progressing   Problem: Coping: Goal: Will verbalize positive feelings about self Outcome: Progressing Goal: Will identify appropriate support needs Outcome: Progressing   Problem: Health Behavior/Discharge Planning: Goal: Ability to manage health-related needs will improve Outcome: Progressing   Problem: Self-Care: Goal: Ability to participate in self-care as condition permits will improve Outcome: Progressing Goal: Verbalization of feelings and concerns over difficulty with self-care will improve Outcome: Progressing Goal: Ability to communicate needs accurately will improve Outcome: Progressing   Problem: Nutrition: Goal: Risk of aspiration will decrease Outcome: Progressing Goal: Dietary intake will improve Outcome: Progressing   Problem: Ischemic Stroke/TIA Tissue Perfusion: Goal: Complications of ischemic stroke/TIA will be minimized Outcome: Progressing   Problem: Education: Goal: Knowledge of General Education information will improve Description: Including pain rating scale, medication(s)/side effects and non-pharmacologic comfort measures Outcome: Progressing   Problem: Health Behavior/Discharge Planning: Goal: Ability to manage health-related needs will improve Outcome: Progressing   Problem: Clinical Measurements: Goal: Ability to maintain clinical measurements within normal limits will  improve Outcome: Progressing Goal: Will remain free from infection Outcome: Progressing Goal: Diagnostic test results will improve Outcome: Progressing Goal: Respiratory complications will improve Outcome: Progressing Goal: Cardiovascular complication will be avoided Outcome: Progressing   Problem: Activity: Goal: Risk for activity intolerance will decrease Outcome: Progressing   Problem: Nutrition: Goal: Adequate nutrition will be maintained Outcome: Progressing   Problem: Coping: Goal: Level of anxiety will decrease Outcome: Progressing   Problem: Elimination: Goal: Will not experience complications related to bowel motility Outcome: Progressing Goal: Will not experience complications related to urinary retention Outcome: Progressing   Problem: Pain Managment: Goal: General experience of comfort will improve Outcome: Progressing   Problem: Safety: Goal: Ability to remain free from injury will improve Outcome: Progressing   Problem: Skin Integrity: Goal: Risk for impaired skin integrity will decrease Outcome: Progressing

## 2019-09-19 NOTE — Progress Notes (Signed)
Referral received to assist pt with [facility] PT/OT. Met with pt and husband. Pt plans to return home with the support of her husband. She is active with [facility] for nursing and PT/OT. Husband wants to find a new PCP for his wife. He reports that she has a PCP but he hasn't been able to make an appointment. He reports that he has a PCP at the [facility] and he likes him. Encouraged husband to contact the [facility] tomorrow morning and see if his PCP can see his wife or another physician and explain that his wife was D/C from the hospital and she needs to f/u with a physician. Discussed with them the importance of seeing a physician after being D/C from the hospital. Husband verbalized understanding. He reports that he has the phone # for the [facility]. Informed Corene Cornea with [facility] that pt is going home today.

## 2019-09-19 NOTE — Discharge Summary (Signed)
Physician Discharge Summary  [name] [id number] DOB: 11/06/1935 DOA: 09/16/2019  PCP: [name], PA-C  Admit date: 09/16/2019 Discharge date: 09/19/2019  Recommendations for Outpatient Follow-up:  1. Follow up with PCP in 7-10 days 2. Follow up with Cardiology within a month. Abnormal echocardiogram. 3. Home health PT/OT  Follow-up Information    [facility].   Specialty: [facility]       [place], Lucy Antigua, MD. Schedule an appointment as soon as possible for a visit in 4 week(s).   Specialties: Neurology, Radiology Contact information: [address] [place] [address] [phone]          Discharge Diagnoses: Principal diagnosis is #1 1. Acute CVA 2. Hypertension 3. Altered mental status 4. DM II 5. Gastroparesis 6. Rheumatoid arthritis 7. COPD 8. Chronic diastolic CHF 9. Abnormal echocardiogram  Discharge Condition: Fair  Disposition: Home with home health PT/OT  Diet recommendation: Heart healthy with DM II  Filed Weights   09/17/19 0038  Weight: 48.1 kg   History of present illness:  [name] is a 84 y.o. female with medical history significant of DM; OSA on BIPAP; HTN; HLD; COPD; RA on chronic immunosuppression; and chronic diastolic CHF presenting withAMS.  Her husband reported confusion for a few days and right-sided weakness today.  Her niece was thinking this might be related to her new medications (Bentyl).  The patient complains of pain all over her body.  She was last admitted from 1/29-2/5 with syncope; this was thought to be related to a vasovagal episode with dehydration.  She has had 5 admissions and 3 ER visits in the last 6 months.  She had an echo on 2/5 with normal EF and asymmetric basal septal hypertrophy - ? HOCM vs. Amyloidosis.   ED Course:  Probable CVA.  She has had AMS since last d/c but worse the last few days. Noted to have RUE paralysis.   CT ok, neuro is following and recommending MRI.  Hospital Course:  [name] a 84 y.o.femalewith medical history significant ofDM; OSA on BIPAP; HTN; HLD; COPD; RA on chronic immunosuppression; and chronic diastolic CHF presenting withAMS.Her husband reported confusion for a few days and right-sided weakness today. Her niece was thinking this might be related to her new medications (Bentyl). The patient complains of pain all over her body.  She was last admitted from 1/29-2/5 with syncope; this was thought to be related to a vasovagal episode with dehydration. She has had 5 admissions and 3 ER visits in the last 6 months. She had an echo on 2/5 with normal EF and asymmetric basal septal hypertrophy - ? HOCM vs. Amyloidosis.  Probable CVA. She has had AMS since last d/c but worse the last few days. Noted to have RUE paralysis. CT ok, neuro is following and recommending MRI.  [facility] hospitalists were consulted to admit the patient for further evaluation and treatment.  She was admitted to a telemetry bed. Neurology was consulted. MRI was performed and demonstrated 1-2 cm infarct of the left posterior frontal brain. There was no mass effect or hemorrhage evident.   Neurology has been consulted and the patient has been evaluated by Dr. Rodney Booze. He has recommended a stroke work up. Echo has demonstrated nl EF with hyperdynamic myocardium with no wall motion abnormalities. 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 Right ventricle is normal with normal RV systolic pressure. No intracardiac thrombus or intra-atrial shunt observed. I have  discussed the patient with Dr. Domenic Polite. He recommends follow up with cardiology as outpatient for further investigation of HCOM vs amyloidosis. States that patient is safe for discharge to home.  Today's assessment: S: The patient is resting comfortably. No new complaints. O: Vitals:  Vitals:   09/19/19 0813  09/19/19 0910  BP:  (!) 143/77  Pulse:  (!) 120  Resp:  18  Temp:  99.1 F (37.3 C)  SpO2: 97% 99%   Exam:  Constitutional:  . The patient is awake, alert, and oriented x 3. No acute distress. Respiratory:  . No increased work of breathing. . No wheezes, rales, or rhonchi . No tactile fremitus Cardiovascular:  . Regular rate and rhythm . No murmurs, ectopy, or gallups. . No lateral PMI. No thrills. Abdomen:  . Abdomen is soft, non-tender, non-distended . No hernias, masses, or organomegaly . Normoactive bowel sounds.  Musculoskeletal:  . No cyanosis, clubbing, or edema Skin:  . No rashes, lesions, ulcers . palpation of skin: no induration or nodules Neurologic:  . CN 2-12 intact . Sensation all 4 extremities intact Psychiatric:  . Mental status o Mood, affect appropriate o Orientation to person, place, time  . judgment and insight appear intact  Discharge Instructions  Discharge Instructions    Activity as tolerated - No restrictions   Complete by: As directed    Ambulatory referral to Neurology   Complete by: As directed    Follow up with Dr. Leonie Man at [facility] in 4-6 weeks. Too complicated for RN to follow. Thanks.   Call MD for:   Complete by: As directed    Neurological deficits   Call MD for:  difficulty breathing, headache or visual disturbances   Complete by: As directed    Diet - low sodium heart healthy   Complete by: As directed    Diet Carb Modified   Complete by: As directed    Discharge instructions   Complete by: As directed    Follow up with PCP in 7-10 days Follow up with Cardiology within a month. Abnormal echocardiogram. Home health PT/OT   Increase activity slowly   Complete by: As directed      Allergies as of 09/19/2019      Reactions   Lactose Intolerance (gi)       Medication List    STOP taking these medications   carvedilol 6.25 MG tablet Commonly known as: COREG     TAKE these medications   albuterol 0.63 MG/3ML nebulizer  solution Commonly known as: ACCUNEB Take 2 ampules by nebulization every 6 (six) hours as needed for wheezing.   aspirin EC 81 MG tablet Take 1 tablet (81 mg total) by mouth every Monday, Wednesday, and Friday.   atorvastatin 20 MG tablet Commonly known as: Lipitor Take 1 tablet (20 mg total) by mouth daily. Take 1 Tablet by mouth daily. What changed:   medication strength  how much to take  how to take this  when to take this  additional instructions   clopidogrel 75 MG tablet Commonly known as: PLAVIX Take 1 tablet (75 mg total) by mouth daily. Start taking on: September 20, 2019   cyanocobalamin 1000 MCG tablet Take 1 tablet (1,000 mcg total) by mouth daily. Start taking on: September 20, 2019   dicyclomine 10 MG capsule Commonly known as: BENTYL Take 1 capsule (10 mg total) by mouth every 8 (eight) hours as needed for spasms.   docusate sodium 100 MG capsule Commonly known as: COLACE Take 1  capsule (100 mg total) by mouth 2 (two) times daily.   Dulera 200-5 MCG/ACT Aero Generic drug: mometasone-formoterol Inhale 2 puffs into the lungs 2 (two) times daily.   feeding supplement (ENSURE ENLIVE) Liqd Take 237 mLs by mouth 3 (three) times daily between meals.   Gas-X Extra Strength 125 MG chewable tablet Generic drug: simethicone Chew 125 mg by mouth every 6 (six) hours as needed for flatulence.   hydrOXYzine 10 MG tablet Commonly known as: ATARAX/VISTARIL 1 q 8h prn itching What changed:   how much to take  how to take this  when to take this  reasons to take this  additional instructions   magnesium chloride 64 MG Tbec SR tablet Commonly known as: SLOW-MAG Take 1 tablet (64 mg total) by mouth 2 (two) times daily.   metoCLOPramide 5 MG tablet Commonly known as: REGLAN Take 1 tablet (5 mg total) by mouth 3 (three) times daily before meals.   metoprolol tartrate 25 MG tablet Commonly known as: LOPRESSOR Take 1 tablet (25 mg total) by mouth 2  (two) times daily.   multivitamin with minerals Tabs tablet Take 1 tablet by mouth daily. Start taking on: September 20, 2019   ondansetron 4 MG disintegrating tablet Commonly known as: Zofran ODT Take 1 tablet (4 mg total) by mouth every 8 (eight) hours as needed for up to 30 doses for nausea or vomiting.   OneTouch Verio test strip Generic drug: glucose blood Use to check blood sugar 3 to 4 times a day before meals   pantoprazole 40 MG tablet Commonly known as: PROTONIX Take 1 tablet (40 mg total) by mouth daily for 30 doses.   polyethylene glycol 17 g packet Commonly known as: MiraLax Take 17 g by mouth daily.      Allergies  Allergen Reactions  . Lactose Intolerance (Gi)     The results of significant diagnostics from this hospitalization (including imaging, microbiology, ancillary and laboratory) are listed below for reference.    Significant Diagnostic Studies: CT ABDOMEN PELVIS WO CONTRAST  Result Date: 09/01/2019 CLINICAL DATA:  Lower abdominal pain. Acute kidney injury. EXAM: CT ABDOMEN AND PELVIS WITHOUT CONTRAST TECHNIQUE: Multidetector CT imaging of the abdomen and pelvis was performed following the standard protocol without IV contrast. COMPARISON:  08/13/2019 FINDINGS: Lower chest: No acute findings. Hepatobiliary: No mass visualized on this unenhanced exam. Prior cholecystectomy. No evidence of biliary obstruction. Pancreas: No mass or inflammatory process visualized on this unenhanced exam. Spleen:  Within normal limits in size. Adrenals/Urinary tract: No evidence of urolithiasis or hydronephrosis. Unremarkable unopacified urinary bladder. Stomach/Bowel: No evidence of obstruction, inflammatory process, or abnormal fluid collections. Vascular/Lymphatic: No pathologically enlarged lymph nodes identified. No evidence of abdominal aortic aneurysm. Aortic atherosclerosis incidentally noted. Reproductive: Prior hysterectomy noted. Adnexal regions are unremarkable in  appearance. Other: Benign-appearing calcification in the anterior left upper quadrant remains stable. Musculoskeletal: No suspicious bone lesions identified. Stable old compression fracture deformity of the L4 vertebral body and previous vertebroplasties at T11 and T12. IMPRESSION: No evidence of urolithiasis, hydronephrosis, or other acute findings. Electronically Signed   By: Marlaine Hind M.D.   On: 09/01/2019 17:05   CT Angio Head W or Wo Contrast  Result Date: 09/16/2019 CLINICAL DATA:  Right arm weakness.  Stroke code. EXAM: CT ANGIOGRAPHY HEAD AND NECK CT PERFUSION BRAIN TECHNIQUE: Multidetector CT imaging of the head and neck was performed using the standard protocol during bolus administration of intravenous contrast. Multiplanar CT image reconstructions and MIPs were obtained to  evaluate the vascular anatomy. Carotid stenosis measurements (when applicable) are obtained utilizing NASCET criteria, using the distal internal carotid diameter as the denominator. Multiphase CT imaging of the brain was performed following IV bolus contrast injection. Subsequent parametric perfusion maps were calculated using RAPID software. CONTRAST:  153mL OMNIPAQUE IOHEXOL 350 MG/ML SOLN COMPARISON:  Noncontrast head CT performed earlier the same day. FINDINGS: CTA NECK FINDINGS Aortic arch: Common origin of the innominate and left common carotid arteries. No significant atherosclerotic disease within the visualized aortic arch. No significant innominate or proximal subclavian artery stenosis. Right carotid system: CCA and ICA patent within the neck. Calcified plaque within the proximal ICA results in less than 50% stenosis. Left carotid system: CCA and ICA patent within the neck. Calcified plaque within the proximal ICA results in less than 50% stenosis. Vertebral arteries: The right vertebral artery is slightly dominant. The vertebral arteries are patent within the neck bilaterally without stenosis. Skeleton: No acute bony  abnormality. Cervical spondylosis with multilevel posterior disc osteophytes, uncovertebral and facet hypertrophy. Multilevel degenerative fusion within the cervical and upper thoracic spine related to prominent ventrolateral bridging osteophytes. Other neck: No soft tissue neck mass or cervical lymphadenopathy. Thyroid unremarkable. Upper chest: There is no consolidation within the imaged lung apices. Review of the MIP images confirms the above findings CTA HEAD FINDINGS Anterior circulation: The intracranial internal carotid arteries are patent bilaterally. Mild calcified plaque within the cavernous segments. Mild stenosis within the left cavernous ICA. The M1 middle cerebral arteries are patent bilaterally without significant stenosis. No M2 proximal branch occlusion or high-grade proximal stenosis is identified. The anterior cerebral arteries are patent bilaterally without high-grade proximal stenosis. The A1 right ACA is hypoplastic. No intracranial aneurysm is identified. Posterior circulation: The intracranial vertebral arteries are patent bilaterally. Calcified plaque within the intracranial right vertebral artery with focal mild/moderate stenosis. The basilar artery is patent without significant stenosis. Fetal origin of the right posterior cerebral artery. A small posterior communicating arteries present on the left. The posterior cerebral arteries are patent bilaterally. Moderate to moderately advanced segmental stenosis within the P2 right posterior cerebral artery (series 11, image 21) Venous sinuses: Within limitations of contrast timing, no convincing thrombus. Anatomic variants: As described. Review of the MIP images confirms the above findings CT Brain Perfusion Findings: CBF (<30%) Volume: 47mL Perfusion (Tmax>6.0s) volume: 51mL Mismatch Volume: 15mL Infarction Location:None identified IMPRESSION: CTA neck: 1. The common and internal carotid arteries are patent within the neck. Calcified plaque within  the proximal ICAs results in less than 50% stenosis bilaterally. 2. The vertebral arteries are patent within the neck without stenosis. 3. Cervical spondylosis with multilevel posterior disc osteophytes, uncovertebral and facet hypertrophy. There is also multilevel degenerative fusion within the cervical and upper thoracic spine related to bridging ventral osteophytes. CTA head: 1. No intracranial large vessel occlusion. 2. Mild calcified plaque within the intracranial internal carotid arteries. Mild stenosis of the cavernous left ICA. 3. Calcified plaque results in focal mild/moderate stenosis within the V4 right vertebral artery. 4. Moderate segmental stenosis within the P2 right posterior cerebral artery. CT perfusion head: The CT perfusion software identifies no core infarct. The CT perfusion software identifies no region of critically hypoperfused parenchyma utilizing a Tmax>6 seconds threshold. Electronically Signed   By: Kellie Simmering DO   On: 09/16/2019 13:49   CT HEAD WO CONTRAST  Result Date: 08/27/2019 CLINICAL DATA:  Syncope EXAM: CT HEAD WITHOUT CONTRAST TECHNIQUE: Contiguous axial images were obtained from the base of the skull through  the vertex without intravenous contrast. COMPARISON:  05/19/2019 FINDINGS: Brain: There is atrophy and chronic small vessel disease changes. No acute intracranial abnormality. Specifically, no hemorrhage, hydrocephalus, mass lesion, acute infarction, or significant intracranial injury. Vascular: No hyperdense vessel or unexpected calcification. Skull: No acute calvarial abnormality. Sinuses/Orbits: Visualized paranasal sinuses and mastoids clear. Orbital soft tissues unremarkable. Other: None IMPRESSION: Atrophy, chronic microvascular disease. No acute intracranial abnormality. Electronically Signed   By: Rolm Baptise M.D.   On: 08/27/2019 02:10   CT Angio Neck W and/or Wo Contrast  Result Date: 09/16/2019 CLINICAL DATA:  Right arm weakness.  Stroke code. EXAM: CT  ANGIOGRAPHY HEAD AND NECK CT PERFUSION BRAIN TECHNIQUE: Multidetector CT imaging of the head and neck was performed using the standard protocol during bolus administration of intravenous contrast. Multiplanar CT image reconstructions and MIPs were obtained to evaluate the vascular anatomy. Carotid stenosis measurements (when applicable) are obtained utilizing NASCET criteria, using the distal internal carotid diameter as the denominator. Multiphase CT imaging of the brain was performed following IV bolus contrast injection. Subsequent parametric perfusion maps were calculated using RAPID software. CONTRAST:  138mL OMNIPAQUE IOHEXOL 350 MG/ML SOLN COMPARISON:  Noncontrast head CT performed earlier the same day. FINDINGS: CTA NECK FINDINGS Aortic arch: Common origin of the innominate and left common carotid arteries. No significant atherosclerotic disease within the visualized aortic arch. No significant innominate or proximal subclavian artery stenosis. Right carotid system: CCA and ICA patent within the neck. Calcified plaque within the proximal ICA results in less than 50% stenosis. Left carotid system: CCA and ICA patent within the neck. Calcified plaque within the proximal ICA results in less than 50% stenosis. Vertebral arteries: The right vertebral artery is slightly dominant. The vertebral arteries are patent within the neck bilaterally without stenosis. Skeleton: No acute bony abnormality. Cervical spondylosis with multilevel posterior disc osteophytes, uncovertebral and facet hypertrophy. Multilevel degenerative fusion within the cervical and upper thoracic spine related to prominent ventrolateral bridging osteophytes. Other neck: No soft tissue neck mass or cervical lymphadenopathy. Thyroid unremarkable. Upper chest: There is no consolidation within the imaged lung apices. Review of the MIP images confirms the above findings CTA HEAD FINDINGS Anterior circulation: The intracranial internal carotid arteries  are patent bilaterally. Mild calcified plaque within the cavernous segments. Mild stenosis within the left cavernous ICA. The M1 middle cerebral arteries are patent bilaterally without significant stenosis. No M2 proximal branch occlusion or high-grade proximal stenosis is identified. The anterior cerebral arteries are patent bilaterally without high-grade proximal stenosis. The A1 right ACA is hypoplastic. No intracranial aneurysm is identified. Posterior circulation: The intracranial vertebral arteries are patent bilaterally. Calcified plaque within the intracranial right vertebral artery with focal mild/moderate stenosis. The basilar artery is patent without significant stenosis. Fetal origin of the right posterior cerebral artery. A small posterior communicating arteries present on the left. The posterior cerebral arteries are patent bilaterally. Moderate to moderately advanced segmental stenosis within the P2 right posterior cerebral artery (series 11, image 21) Venous sinuses: Within limitations of contrast timing, no convincing thrombus. Anatomic variants: As described. Review of the MIP images confirms the above findings CT Brain Perfusion Findings: CBF (<30%) Volume: 44mL Perfusion (Tmax>6.0s) volume: 86mL Mismatch Volume: 70mL Infarction Location:None identified IMPRESSION: CTA neck: 1. The common and internal carotid arteries are patent within the neck. Calcified plaque within the proximal ICAs results in less than 50% stenosis bilaterally. 2. The vertebral arteries are patent within the neck without stenosis. 3. Cervical spondylosis with multilevel posterior disc osteophytes, uncovertebral  and facet hypertrophy. There is also multilevel degenerative fusion within the cervical and upper thoracic spine related to bridging ventral osteophytes. CTA head: 1. No intracranial large vessel occlusion. 2. Mild calcified plaque within the intracranial internal carotid arteries. Mild stenosis of the cavernous left ICA.  3. Calcified plaque results in focal mild/moderate stenosis within the V4 right vertebral artery. 4. Moderate segmental stenosis within the P2 right posterior cerebral artery. CT perfusion head: The CT perfusion software identifies no core infarct. The CT perfusion software identifies no region of critically hypoperfused parenchyma utilizing a Tmax>6 seconds threshold. Electronically Signed   By: Kellie Simmering DO   On: 09/16/2019 13:49   CT Angio Chest PE W and/or Wo Contrast  Result Date: 09/06/2019 CLINICAL DATA:  Short of breath EXAM: CT ANGIOGRAPHY CHEST WITH CONTRAST TECHNIQUE: Multidetector CT imaging of the chest was performed using the standard protocol during bolus administration of intravenous contrast. Multiplanar CT image reconstructions and MIPs were obtained to evaluate the vascular anatomy. CONTRAST:  59mL OMNIPAQUE IOHEXOL 350 MG/ML SOLN COMPARISON:  09/06/2019, 06/14/2015 FINDINGS: Cardiovascular: This is a technically adequate evaluation of the pulmonary vasculature. No filling defects or pulmonary emboli. There is a small pericardial effusion versus pericardial thickening, new since prior exam. If further evaluation is desired, echocardiography could be considered. Thoracic aorta is grossly normal in caliber with only minimal atheromatous plaque. Mediastinum/Nodes: No enlarged mediastinal, hilar, or axillary lymph nodes. Thyroid gland, trachea, and esophagus demonstrate no significant findings. Lungs/Pleura: There are dependent hypoventilatory changes within the bilateral lower lobes. No acute airspace disease, effusion, or pneumothorax. The central airways are patent. Upper Abdomen: No acute abnormalities. Stable calcified mass arising from the gastric wall. Musculoskeletal: There are no acute or destructive bony lesions. Chronic T12 compression deformity and vertebral augmentation at T11 and T12. Reconstructed images demonstrate no additional findings. Review of the MIP images confirms the  above findings. IMPRESSION: 1. Small pericardial effusion versus pericardial thickening. There is some stranding within the pericardial fat. If pericarditis is a clinical concern, echocardiography could be considered. 2. No evidence of pulmonary embolus. 3. Otherwise no acute intrathoracic process. 4.  Aortic Atherosclerosis (ICD10-I70.0). Electronically Signed   By: Randa Ngo M.D.   On: 09/06/2019 16:28   CT CERVICAL SPINE WO CONTRAST  Result Date: 08/27/2019 CLINICAL DATA:  Syncope EXAM: CT CERVICAL SPINE WITHOUT CONTRAST TECHNIQUE: Multidetector CT imaging of the cervical spine was performed without intravenous contrast. Multiplanar CT image reconstructions were also generated. COMPARISON:  MRI 06/15/2015 FINDINGS: Alignment: No subluxation Skull base and vertebrae: No acute fracture. No primary bone lesion or focal pathologic process. Soft tissues and spinal canal: No prevertebral fluid or swelling. No visible canal hematoma. Disc levels:  Diffuse degenerative disc and facet disease. Upper chest: No acute findings Other: Carotid artery calcifications. IMPRESSION: Cervical spondylosis.  No acute bony abnormality. Electronically Signed   By: Rolm Baptise M.D.   On: 08/27/2019 02:12   MR BRAIN WO CONTRAST  Result Date: 09/16/2019 CLINICAL DATA:  Follow-up stroke.  Right arm weakness. EXAM: MRI HEAD WITHOUT CONTRAST TECHNIQUE: Multiplanar, multiecho pulse sequences of the brain and surrounding structures were obtained without intravenous contrast. COMPARISON:  CT studies earlier same day FINDINGS: The study suffers from marked motion degradation. Brain: Small (1-2 cm) region of early acute infarction in the left posterior frontal region. No other acute finding. No abnormality seen affecting the brainstem or cerebellum. Mild chronic small-vessel changes of the hemispheric white matter considering age. No hydrocephalus or extra-axial collection. No sign  of hemorrhage. Vascular: Major vessels at the base  of the brain show flow. Skull and upper cervical spine: Negative Sinuses/Orbits: Clear/normal Other: None IMPRESSION: 1-2 cm region of early acute infarction in the left posterior frontal brain. No mass effect or hemorrhage evident. Electronically Signed   By: Nelson Chimes M.D.   On: 09/16/2019 16:27   CT CEREBRAL PERFUSION W CONTRAST  Result Date: 09/16/2019 CLINICAL DATA:  Right arm weakness.  Stroke code. EXAM: CT ANGIOGRAPHY HEAD AND NECK CT PERFUSION BRAIN TECHNIQUE: Multidetector CT imaging of the head and neck was performed using the standard protocol during bolus administration of intravenous contrast. Multiplanar CT image reconstructions and MIPs were obtained to evaluate the vascular anatomy. Carotid stenosis measurements (when applicable) are obtained utilizing NASCET criteria, using the distal internal carotid diameter as the denominator. Multiphase CT imaging of the brain was performed following IV bolus contrast injection. Subsequent parametric perfusion maps were calculated using RAPID software. CONTRAST:  11mL OMNIPAQUE IOHEXOL 350 MG/ML SOLN COMPARISON:  Noncontrast head CT performed earlier the same day. FINDINGS: CTA NECK FINDINGS Aortic arch: Common origin of the innominate and left common carotid arteries. No significant atherosclerotic disease within the visualized aortic arch. No significant innominate or proximal subclavian artery stenosis. Right carotid system: CCA and ICA patent within the neck. Calcified plaque within the proximal ICA results in less than 50% stenosis. Left carotid system: CCA and ICA patent within the neck. Calcified plaque within the proximal ICA results in less than 50% stenosis. Vertebral arteries: The right vertebral artery is slightly dominant. The vertebral arteries are patent within the neck bilaterally without stenosis. Skeleton: No acute bony abnormality. Cervical spondylosis with multilevel posterior disc osteophytes, uncovertebral and facet hypertrophy.  Multilevel degenerative fusion within the cervical and upper thoracic spine related to prominent ventrolateral bridging osteophytes. Other neck: No soft tissue neck mass or cervical lymphadenopathy. Thyroid unremarkable. Upper chest: There is no consolidation within the imaged lung apices. Review of the MIP images confirms the above findings CTA HEAD FINDINGS Anterior circulation: The intracranial internal carotid arteries are patent bilaterally. Mild calcified plaque within the cavernous segments. Mild stenosis within the left cavernous ICA. The M1 middle cerebral arteries are patent bilaterally without significant stenosis. No M2 proximal branch occlusion or high-grade proximal stenosis is identified. The anterior cerebral arteries are patent bilaterally without high-grade proximal stenosis. The A1 right ACA is hypoplastic. No intracranial aneurysm is identified. Posterior circulation: The intracranial vertebral arteries are patent bilaterally. Calcified plaque within the intracranial right vertebral artery with focal mild/moderate stenosis. The basilar artery is patent without significant stenosis. Fetal origin of the right posterior cerebral artery. A small posterior communicating arteries present on the left. The posterior cerebral arteries are patent bilaterally. Moderate to moderately advanced segmental stenosis within the P2 right posterior cerebral artery (series 11, image 21) Venous sinuses: Within limitations of contrast timing, no convincing thrombus. Anatomic variants: As described. Review of the MIP images confirms the above findings CT Brain Perfusion Findings: CBF (<30%) Volume: 52mL Perfusion (Tmax>6.0s) volume: 42mL Mismatch Volume: 25mL Infarction Location:None identified IMPRESSION: CTA neck: 1. The common and internal carotid arteries are patent within the neck. Calcified plaque within the proximal ICAs results in less than 50% stenosis bilaterally. 2. The vertebral arteries are patent within the  neck without stenosis. 3. Cervical spondylosis with multilevel posterior disc osteophytes, uncovertebral and facet hypertrophy. There is also multilevel degenerative fusion within the cervical and upper thoracic spine related to bridging ventral osteophytes. CTA head: 1. No intracranial large  vessel occlusion. 2. Mild calcified plaque within the intracranial internal carotid arteries. Mild stenosis of the cavernous left ICA. 3. Calcified plaque results in focal mild/moderate stenosis within the V4 right vertebral artery. 4. Moderate segmental stenosis within the P2 right posterior cerebral artery. CT perfusion head: The CT perfusion software identifies no core infarct. The CT perfusion software identifies no region of critically hypoperfused parenchyma utilizing a Tmax>6 seconds threshold. Electronically Signed   By: Kellie Simmering DO   On: 09/16/2019 13:49   DG Chest Port 1 View  Result Date: 09/16/2019 CLINICAL DATA:  Shortness of breath.  Chest pain. EXAM: PORTABLE CHEST 1 VIEW COMPARISON:  None. FINDINGS: Cardiomegaly. The hila and mediastinum are normal. No pneumothorax. No nodules or masses. No focal infiltrates. IMPRESSION: No active disease. Electronically Signed   By: Dorise Bullion III M.D   On: 09/16/2019 14:24   DG Chest Port 1 View  Result Date: 09/06/2019 CLINICAL DATA:  Shortness of breath. EXAM: PORTABLE CHEST 1 VIEW COMPARISON:  Chest x-ray dated August 27, 2019. FINDINGS: The heart size and mediastinal contours are within normal limits. Normal pulmonary vascularity. No focal consolidation, pleural effusion, or pneumothorax. Unchanged mild scarring in the right middle lobe. No acute osseous abnormality. IMPRESSION: No active disease. Electronically Signed   By: Titus Dubin M.D.   On: 09/06/2019 11:46   DG Chest Port 1 View  Result Date: 08/27/2019 CLINICAL DATA:  Syncope, weakness for 1 week, denies head strike EXAM: PORTABLE CHEST 1 VIEW COMPARISON:  Radiograph 07/12/2019 FINDINGS:  Bandlike area of scarring or atelectasis in the right mid lung with chronic elevation of the right hemidiaphragm. No consolidation, features of edema, pneumothorax, or effusion. The cardiomediastinal contours are unremarkable. No acute osseous or soft tissue abnormality. Degenerative changes are present in the imaged spine and shoulders. Elevation of the right humerus relative to the glenoid with undersurface remodeling of the acromion likely reflect some rotator cuff insufficiency. Telemetry leads overlie the chest. IMPRESSION: No acute cardiopulmonary abnormality. Electronically Signed   By: Lovena Le M.D.   On: 08/27/2019 02:23   DG Humerus Right  Result Date: 08/27/2019 CLINICAL DATA:  Pain, generalized weakness EXAM: RIGHT HUMERUS - 2+ VIEW COMPARISON:  Concurrent chest radiograph FINDINGS: The osseous structures appear diffusely demineralized which may limit detection of small or nondisplaced fractures. No acute bony abnormality. Specifically, no fracture, subluxation, or dislocation. Slight elevation of the right humeral head relative to the glenoid with some mild undersurface remodeling of the acromion is compatible with chronic rotator cuff insufficiency. Degenerative changes at the glenohumeral joint are mild with more moderate change at the acromioclavicular joint. Acromioclavicular and coracoclavicular intervals are maintained. The humerus is intact. Alignment at the elbow is grossly preserved though incompletely assessed on this non dedicated radiograph. Benign-appearing soft tissue mineralization seen in the lateral upper arm. IMPRESSION: No acute bony abnormality. Degenerative changes at the glenohumeral and acromioclavicular joints. Findings compatible with chronic rotator cuff insufficiency. Electronically Signed   By: Lovena Le M.D.   On: 08/27/2019 02:26   ECHOCARDIOGRAM COMPLETE  Result Date: 09/03/2019   ECHOCARDIOGRAM REPORT   Patient Name:   [name] Date of Exam: 09/03/2019  Medical Rec #:  [id number]      Height:       65.0 in Accession #:    [id number]     Weight:       109.7 lb Date of Birth:  1936/07/25      BSA:  1.53 m Patient Age:    67 years       BP:           162/81 mmHg Patient Gender: F              HR:           101 bpm. Exam Location:  Inpatient Procedure: 2D Echo Indications:    780.2 syncope  History:        Patient has prior history of Echocardiogram examinations, most                 recent 01/28/2019. COPD; Risk Factors:Diabetes, Dyslipidemia and                 Former Smoker. Sepsis. anginal pain.  Sonographer:    Jannett Celestine RDCS (AE) Referring Phys: [id number] [place] IMPRESSIONS  1. Left ventricular ejection fraction, by visual estimation, is 65 to 70%. The left ventricle has hyperdynamic function. There is severely increased left ventricular hypertrophy.  2. Asymmetric basal septal hypertrophy measuring 51mm in basal septum (75mm in posterior wall). No resting LVOT obstruction. Consider cardiac MRI to evaluate for hypertrophic cardiomyopathy versus amyloidosis if clinically indicated  3. Left ventricular diastolic parameters are indeterminate.  4. Global right ventricle has normal systolic function.The right ventricular size is normal.  5. Small pericardial effusion.  6. Presence of pericardial fat pad.  7. The mitral valve is normal in structure. Trivial mitral valve regurgitation.  8. The tricuspid valve is normal in structure. Tricuspid valve regurgitation is not demonstrated.  9. The pulmonic valve was not well visualized. Pulmonic valve regurgitation is not visualized. 10. The aortic valve was not well visualized. Aortic valve regurgitation is not visualized. No evidence of aortic valve stenosis. 11. Left atrial size was normal. 12. Right atrial size was normal. 13. The inferior vena cava is normal in size with <50% respiratory variability, suggesting right atrial pressure of 8 mmHg. 14. TR signal is inadequate for assessing pulmonary artery systolic  pressure. FINDINGS  Left Ventricle: Left ventricular ejection fraction, by visual estimation, is 65 to 70%. The left ventricle has hyperdynamic function. The left ventricle has no regional wall motion abnormalities. There is severely increased left ventricular hypertrophy.  Asymmetric left ventricular hypertrophy. Left ventricular diastolic parameters are indeterminate. Right Ventricle: The right ventricular size is normal. No increase in right ventricular wall thickness. Global RV systolic function is has normal systolic function. Left Atrium: Left atrial size was normal in size. Right Atrium: Right atrial size was normal in size Pericardium: A small pericardial effusion is present. Presence of pericardial fat pad. Mitral Valve: The mitral valve is normal in structure. Trivial mitral valve regurgitation. Tricuspid Valve: The tricuspid valve is normal in structure. Tricuspid valve regurgitation is not demonstrated. Aortic Valve: The aortic valve was not well visualized. Aortic valve regurgitation is not visualized. The aortic valve is structurally normal, with no evidence of sclerosis or stenosis. Pulmonic Valve: The pulmonic valve was not well visualized. Pulmonic valve regurgitation is not visualized. Pulmonic regurgitation is not visualized. Aorta: The aortic root is normal in size and structure. Venous: The inferior vena cava is normal in size with less than 50% respiratory variability, suggesting right atrial pressure of 8 mmHg. IAS/Shunts: The interatrial septum was not well visualized.  LEFT VENTRICLE PLAX 2D LVIDd:         3.60 cm LVIDs:         2.20 cm LV PW:  1.20 cm LV IVS:        1.20 cm LVOT diam:     2.10 cm LV SV:         38 ml LV SV Index:   25.45 LVOT Area:     3.46 cm  RIGHT VENTRICLE RV S prime:     31.80 cm/s LEFT ATRIUM             Index       RIGHT ATRIUM           Index LA diam:        3.50 cm 2.28 cm/m  RA Area:     10.90 cm LA Vol (A2C):   31.8 ml 20.75 ml/m RA Volume:   20.90 ml   13.64 ml/m LA Vol (A4C):   24.0 ml 15.66 ml/m LA Biplane Vol: 27.6 ml 18.01 ml/m  AORTIC VALVE LVOT Vmax:   87.70 cm/s LVOT Vmean:  65.500 cm/s LVOT VTI:    0.174 m  AORTA Ao Root diam: 2.80 cm MITRAL VALVE MV Area (PHT): 3.37 cm             SHUNTS MV PHT:        65.25 msec           Systemic VTI:  0.17 m MV Decel Time: 225 msec             Systemic Diam: 2.10 cm MV E velocity: 83.20 cm/s 103 cm/s MV A velocity: 90.60 cm/s 70.3 cm/s MV E/A ratio:  0.92       1.5  Oswaldo Milian MD Electronically signed by Oswaldo Milian MD Signature Date/Time: 09/03/2019/1:41:18 PM    Final    CT HEAD CODE STROKE WO CONTRAST  Result Date: 09/16/2019 CLINICAL DATA:  Code stroke. EXAM: CT HEAD WITHOUT CONTRAST TECHNIQUE: Contiguous axial images were obtained from the base of the skull through the vertex without intravenous contrast. COMPARISON:  None. FINDINGS: Brain: No evidence of acute infarction, hemorrhage, hydrocephalus, extra-axial collection or mass lesion/mass effect. Hypodensity of the periventricular white matter likely related to chronic small vessel ischemia. Vascular: No hyperdense vessel or unexpected calcification. Skull: Normal. Negative for fracture or focal lesion. Sinuses/Orbits: No acute finding. Other: None. ASPECTS [facility] Stroke Program Early CT Score) - Ganglionic level infarction (caudate, lentiform nuclei, internal capsule, insula, M1-M3 cortex): 7/7 - Supraganglionic infarction (M4-M6 cortex): 3/3 Total score (0-10 with 10 being normal): 10 IMPRESSION: No acute intracranial findings. Hypodensity of the periventricular white matter likely related to chronic small vessel ischemia. *ASPECTS is 10 *Results communicated to Dr. Leonel Ramsay via pager system. Electronically Signed   By: Pedro Earls M.D.   On: 09/16/2019 13:07   ECHOCARDIOGRAM LIMITED  Result Date: 09/18/2019    ECHOCARDIOGRAM LIMITED REPORT   Patient Name:   Vicki APPELL Date of Exam: 09/18/2019 Medical Rec  #:  [id number]      Height:       65.0 in Accession #:    [id number]     Weight:       106.0 lb Date of Birth:  1936-02-03      BSA:          1.511 m Patient Age:    50 years       BP:           141/83 mmHg Patient Gender: F              HR:  114 bpm. Exam Location:  Inpatient Procedure: Limited Echo, Cardiac Doppler and Color Doppler Indications:    Stroke 434.91/I163.9  History:        Patient has prior history of Echocardiogram examinations, most                 recent 09/03/2019. CHF, COPD; Risk Factors:Diabetes, Hypertension                 and Sleep Apnea.  Sonographer:    Clayton Lefort RDCS (AE) Referring Phys: [id number] [address] [place] IMPRESSIONS  1. Left ventricular ejection fraction, by estimation, is >75%. Left ventricular ejection fraction by PLAX is 86 %. The left ventricle has hyperdynamic function. The left ventricle has no regional wall motion abnormalities. There is severe asymmetric left ventricular hypertrophy of the basal-septal segment.  2. Right ventricular systolic function is normal. The right ventricular size is normal. There is normal pulmonary artery systolic pressure.  3. The mitral valve is normal in structure and function. No evidence of mitral valve regurgitation. No evidence of mitral stenosis.  4. The aortic valve is tricuspid. Aortic valve regurgitation is not visualized. No aortic stenosis is present.  5. The inferior vena cava is normal in size with greater than 50% respiratory variability, suggesting right atrial pressure of 3 mmHg. FINDINGS  Left Ventricle: Severe hypertrophy of the basal anteroseptum and mild LVH of the posterior myocardium. Intracavitary gradient noted. Peak velocity 1.75 m/s. Peak gradient 12.3 mmHg. Findings consistent with hypertrophic cardiomyopathy. Left ventricular ejection fraction, by estimation, is >75%. Left ventricular ejection fraction by PLAX is 86 % The left ventricle has hyperdynamic function. The left ventricle has no regional wall motion  abnormalities. The left ventricular internal cavity size was normal  in size. There is severe asymmetric left ventricular hypertrophy of the basal-septal segment. Right Ventricle: The right ventricular size is normal. No increase in right ventricular wall thickness. Right ventricular systolic function is normal. There is normal pulmonary artery systolic pressure. The tricuspid regurgitant velocity is 2.33 m/s, and  with an assumed right atrial pressure of 3 mmHg, the estimated right ventricular systolic pressure is [id number] mmHg. Left Atrium: Left atrial size was normal in size. Right Atrium: Right atrial size was normal in size. Pericardium: 0.688 cm posterior pericardial effusion. A small pericardial effusion is present. Mitral Valve: The mitral valve is normal in structure and function. Normal mobility of the mitral valve leaflets. No evidence of mitral valve stenosis. Tricuspid Valve: The tricuspid valve is normal in structure. Tricuspid valve regurgitation is trivial. No evidence of tricuspid stenosis. Aortic Valve: The aortic valve is tricuspid. Aortic valve regurgitation is not visualized. No aortic stenosis is present. Pulmonic Valve: The pulmonic valve was normal in structure. Pulmonic valve regurgitation is not visualized. No evidence of pulmonic stenosis. Aorta: The aortic root is normal in size and structure. Venous: The inferior vena cava is normal in size with greater than 50% respiratory variability, suggesting right atrial pressure of 3 mmHg. IAS/Shunts: No atrial level shunt detected by color flow Doppler.  LEFT VENTRICLE PLAX 2D LV EF:         Left ventricular ejection fraction by PLAX is 86 % LVIDd:         2.80 cm LVIDs:         1.30 cm LV PW:         1.30 cm LV IVS:        1.70 cm LVOT diam:     1.90 cm LVOT Area:  2.84 cm  IVC IVC diam: 1.20 cm LEFT ATRIUM         Index LA diam:    2.40 cm 1.59 cm/m   AORTA Ao Root diam: 2.55 cm TRICUSPID VALVE TR Peak grad:   21.7 mmHg TR Vmax:        233.00  cm/s  SHUNTS Systemic Diam: 1.90 cm Skeet Latch MD Electronically signed by Skeet Latch MD Signature Date/Time: 09/18/2019/3:39:25 PM    Final     Microbiology: Recent Results (from the past 240 hour(s))  Respiratory Panel by RT PCR (Flu A&B, Covid) - Nasopharyngeal Swab     Status: None   Collection Time: 09/16/19  1:45 PM   Specimen: Nasopharyngeal Swab  Result Value Ref Range Status   SARS Coronavirus 2 by RT PCR NEGATIVE NEGATIVE Final    Comment: (NOTE) SARS-CoV-2 target nucleic acids are NOT DETECTED. The SARS-CoV-2 RNA is generally detectable in upper respiratoy specimens during the acute phase of infection. The lowest concentration of SARS-CoV-2 viral copies this assay can detect is 131 copies/mL. A negative result does not preclude SARS-Cov-2 infection and should not be used as the sole basis for treatment or other patient management decisions. A negative result may occur with  improper specimen collection/handling, submission of specimen other than nasopharyngeal swab, presence of viral mutation(s) within the areas targeted by this assay, and inadequate number of viral copies (<131 copies/mL). A negative result must be combined with clinical observations, patient history, and epidemiological information. The expected result is Negative. Fact Sheet for Patients:  PinkCheek.be Fact Sheet for Healthcare Providers:  GravelBags.it This test is not yet ap proved or cleared by the [place] FDA and  has been authorized for detection and/or diagnosis of SARS-CoV-2 by FDA under an Emergency Use Authorization (EUA). This EUA will remain  in effect (meaning this test can be used) for the duration of the COVID-19 declaration under Section 564(b)(1) of the Act, 21 U.S.C. section 360bbb-3(b)(1), unless the authorization is terminated or revoked sooner.    Influenza A by PCR NEGATIVE NEGATIVE Final   Influenza B by  PCR NEGATIVE NEGATIVE Final    Comment: (NOTE) The Xpert Xpress SARS-CoV-2/FLU/RSV assay is intended as an aid in  the diagnosis of influenza from Nasopharyngeal swab specimens and  should not be used as a sole basis for treatment. Nasal washings and  aspirates are unacceptable for Xpert Xpress SARS-CoV-2/FLU/RSV  testing. Fact Sheet for Patients: PinkCheek.be Fact Sheet for Healthcare Providers: GravelBags.it This test is not yet approved or cleared by the [place] FDA and  has been authorized for detection and/or diagnosis of SARS-CoV-2 by  FDA under an Emergency Use Authorization (EUA). This EUA will remain  in effect (meaning this test can be used) for the duration of the  Covid-19 declaration under Section 564(b)(1) of the Act, 21  U.S.C. section 360bbb-3(b)(1), unless the authorization is  terminated or revoked. Performed at [facility], [place] [address], [place], [place] [address]      Labs: Basic Metabolic Panel: Recent Labs  Lab 09/16/19 1248 09/16/19 1302 09/17/19 1017 09/19/19 0326  NA 133* 135 135 141  K 3.4* 3.3* 3.5 3.5  CL 100 100 101 110  CO2 24  --  23 22  GLUCOSE 133* 125* 108* 145*  BUN 8 8 10  7*  CREATININE 0.72 0.60 1.07* 0.66  CALCIUM 8.7*  --  8.4* 7.9*   Liver Function Tests: Recent Labs  Lab 09/16/19 1248  AST 46*  ALT 19  ALKPHOS 52  BILITOT 0.4  PROT 6.2*  ALBUMIN 2.9*   No results for input(s): LIPASE, AMYLASE in the last 168 hours. No results for input(s): AMMONIA in the last 168 hours. CBC: Recent Labs  Lab 09/16/19 1248 09/16/19 1302 09/19/19 0326  WBC 6.2  --  5.7  NEUTROABS 3.0  --  3.0  HGB 11.0* 12.2 9.5*  HCT 35.7* 36.0 30.9*  MCV 92.0  --  92.0  PLT 389  --  276   Cardiac Enzymes: No results for input(s): CKTOTAL, CKMB, CKMBINDEX, TROPONINI in the last 168 hours. BNP: BNP (last 3 results) Recent Labs    09/06/19 1124  BNP 45.5    ProBNP  (last 3 results) No results for input(s): PROBNP in the last 8760 hours.  CBG: Recent Labs  Lab 09/18/19 0637 09/18/19 1157 09/18/19 1611 09/18/19 2117 09/19/19 0638  GLUCAP 116* 102* 107* 119* 113*    Principal Problem:   Acute ischemic stroke (HCC) Active Problems:   Rheumatoid arthritis (HCC)   COPD (chronic obstructive pulmonary disease) ([place])   Uncontrolled type 2 diabetes mellitus with complication (HCC)   Chronic diastolic CHF (congestive heart failure) (HCC)   Essential hypertension   Protein-calorie malnutrition, severe   Time coordinating discharge: 38 minutes  Signed:        Laurajean Hosek, DO [facility]  09/19/2019, 4:09 PM

## 2019-09-20 LAB — FANA STAINING PATTERNS
Homogeneous Pattern: 1:320 {titer} — ABNORMAL HIGH
Speckled Pattern: 1:320 {titer} — ABNORMAL HIGH

## 2019-09-20 LAB — ANTINUCLEAR ANTIBODIES, IFA: ANA Ab, IFA: POSITIVE — AB

## 2019-09-21 ENCOUNTER — Ambulatory Visit: Payer: Self-pay

## 2019-09-21 ENCOUNTER — Telehealth: Payer: Self-pay

## 2019-09-21 DIAGNOSIS — E876 Hypokalemia: Secondary | ICD-10-CM

## 2019-09-21 DIAGNOSIS — I10 Essential (primary) hypertension: Secondary | ICD-10-CM

## 2019-09-21 DIAGNOSIS — E1165 Type 2 diabetes mellitus with hyperglycemia: Secondary | ICD-10-CM

## 2019-09-21 DIAGNOSIS — N183 Chronic kidney disease, stage 3 unspecified: Secondary | ICD-10-CM

## 2019-09-21 DIAGNOSIS — IMO0002 Reserved for concepts with insufficient information to code with codable children: Secondary | ICD-10-CM

## 2019-09-21 LAB — FOLATE RBC
Folate, Hemolysate: 264 ng/mL
Folate, RBC: 880 ng/mL (ref >498)
Hematocrit: 30 % — ABNORMAL LOW (ref 34.0–46.6)

## 2019-09-22 ENCOUNTER — Telehealth: Payer: Self-pay

## 2019-09-22 ENCOUNTER — Other Ambulatory Visit: Payer: Self-pay

## 2019-09-22 ENCOUNTER — Ambulatory Visit (INDEPENDENT_AMBULATORY_CARE_PROVIDER_SITE_OTHER): Payer: Medicare Other

## 2019-09-22 ENCOUNTER — Ambulatory Visit (INDEPENDENT_AMBULATORY_CARE_PROVIDER_SITE_OTHER): Payer: Medicare Other | Admitting: Internal Medicine

## 2019-09-22 VITALS — BP 118/72 | HR 100 | Temp 98.2°F

## 2019-09-22 VITALS — BP 118/72 | HR 100 | Temp 98.2°F | Wt 104.4 lb

## 2019-09-22 DIAGNOSIS — N3000 Acute cystitis without hematuria: Secondary | ICD-10-CM | POA: Diagnosis not present

## 2019-09-22 DIAGNOSIS — J45901 Unspecified asthma with (acute) exacerbation: Secondary | ICD-10-CM

## 2019-09-22 DIAGNOSIS — R29898 Other symptoms and signs involving the musculoskeletal system: Secondary | ICD-10-CM | POA: Diagnosis not present

## 2019-09-22 DIAGNOSIS — Z8639 Personal history of other endocrine, nutritional and metabolic disease: Secondary | ICD-10-CM

## 2019-09-22 DIAGNOSIS — R300 Dysuria: Secondary | ICD-10-CM

## 2019-09-22 DIAGNOSIS — R197 Diarrhea, unspecified: Secondary | ICD-10-CM

## 2019-09-22 DIAGNOSIS — Z23 Encounter for immunization: Secondary | ICD-10-CM | POA: Diagnosis not present

## 2019-09-22 DIAGNOSIS — D518 Other vitamin B12 deficiency anemias: Secondary | ICD-10-CM

## 2019-09-22 DIAGNOSIS — D531 Other megaloblastic anemias, not elsewhere classified: Secondary | ICD-10-CM

## 2019-09-22 DIAGNOSIS — D508 Other iron deficiency anemias: Secondary | ICD-10-CM

## 2019-09-22 DIAGNOSIS — R112 Nausea with vomiting, unspecified: Secondary | ICD-10-CM

## 2019-09-22 DIAGNOSIS — Z09 Encounter for follow-up examination after completed treatment for conditions other than malignant neoplasm: Secondary | ICD-10-CM | POA: Diagnosis not present

## 2019-09-22 DIAGNOSIS — Z0000 Encounter for general adult medical examination without abnormal findings: Secondary | ICD-10-CM | POA: Diagnosis not present

## 2019-09-22 LAB — POCT URINALYSIS DIPSTICK
Bilirubin, UA: NEGATIVE
Glucose, UA: NEGATIVE
Ketones, UA: NEGATIVE
Nitrite, UA: NEGATIVE
Protein, UA: POSITIVE — AB
Spec Grav, UA: 1.015 (ref 1.010–1.025)
Urobilinogen, UA: 0.2 E.U./dL
pH, UA: 7 (ref 5.0–8.0)

## 2019-09-22 MED ORDER — CEPHALEXIN 500 MG PO CAPS: 500.0000 mg | ORAL_CAPSULE | Freq: Two times a day (BID) | ORAL | 0 refills | Status: DC

## 2019-09-22 MED ORDER — PREVNAR 13 IM SUSP: 0.5000 mL | INTRAMUSCULAR | 0 refills | Status: AC

## 2019-09-22 MED ORDER — BOOSTRIX 5-2.5-18.5 LF-MCG/0.5 IM SUSP: 0.5000 mL | Freq: Once | INTRAMUSCULAR | 0 refills | Status: AC

## 2019-09-22 MED ORDER — FOLIC ACID 1 MG PO TABS: 1.0000 mg | ORAL_TABLET | Freq: Every day | ORAL | 3 refills | Status: AC

## 2019-09-22 NOTE — Chronic Care Management (AMB) (Addendum)
  Chronic Care Management   Outreach Note  09/22/2019 Name: [name] MRN: [id number] DOB: 1936-03-11  Referred by: [name], PA-C Reason for referral : Chronic Care Management (RQ Initial Call - post d/c)   An unsuccessful telephone outreach was attempted today. The patient was referred to the case management team for assistance with care management and care coordination.   Follow Up Plan: A HIPPA compliant phone message was left for the patient providing contact information and requesting a return call.  Telephone follow up appointment with care management team member scheduled for:10/08/19  Barb Merino, RN, BSN, CCM Care Management Coordinator [place] [facility]  Direct Phone: [phone]

## 2019-09-22 NOTE — Patient Instructions (Signed)
[name] , Thank you for taking time to come for your Medicare Wellness Visit. I appreciate your ongoing commitment to your health goals. Please review the following plan we discussed and let me know if I can assist you in the future.   Screening recommendations/referrals: Colonoscopy: 08/2017 Mammogram:/ 10/2018 Bone Density: 12/2013 Recommended yearly ophthalmology/optometry visit for glaucoma screening and checkup Recommended yearly dental visit for hygiene and checkup  Vaccinations: Influenza vaccine: 03/2019 Pneumococcal vaccine: sent to pharmacy Tdap vaccine: sent to pharmacy Shingles vaccine: discussed    Advanced directives: Advance directive discussed with you today. I have provided a copy for you to complete at home and have notarized. Once this is complete please bring a copy in to our office so we can scan it into your chart.   Conditions/risks identified: COPD  Next appointment: 09/30/2019 at 11:00   Preventive Care 82 Years and Older, Female Preventive care refers to lifestyle choices and visits with your health care provider that can promote health and wellness. What does preventive care include?  A yearly physical exam. This is also called an annual well check.  Dental exams once or twice a year.  Routine eye exams. Ask your health care provider how often you should have your eyes checked.  Personal lifestyle choices, including:  Daily care of your teeth and gums.  Regular physical activity.  Eating a healthy diet.  Avoiding tobacco and drug use.  Limiting alcohol use.  Practicing safe sex.  Taking low-dose aspirin every day.  Taking vitamin and mineral supplements as recommended by your health care provider. What happens during an annual well check? The services and screenings done by your health care provider during your annual well check will depend on your age, overall health, lifestyle risk factors, and family history of disease. Counseling    Your health care provider may ask you questions about your:  Alcohol use.  Tobacco use.  Drug use.  Emotional well-being.  Home and relationship well-being.  Sexual activity.  Eating habits.  History of falls.  Memory and ability to understand (cognition).  Work and work Statistician.  Reproductive health. Screening  You may have the following tests or measurements:  Height, weight, and BMI.  Blood pressure.  Lipid and cholesterol levels. These may be checked every 5 years, or more frequently if you are over 63 years old.  Skin check.  Lung cancer screening. You may have this screening every year starting at age 56 if you have a 30-pack-year history of smoking and currently smoke or have quit within the past 15 years.  Fecal occult blood test (FOBT) of the stool. You may have this test every year starting at age 18.  Flexible sigmoidoscopy or colonoscopy. You may have a sigmoidoscopy every 5 years or a colonoscopy every 10 years starting at age 41.  Hepatitis C blood test.  Hepatitis B blood test.  Sexually transmitted disease (STD) testing.  Diabetes screening. This is done by checking your blood sugar (glucose) after you have not eaten for a while (fasting). You may have this done every 1-3 years.  Bone density scan. This is done to screen for osteoporosis. You may have this done starting at age 44.  Mammogram. This may be done every 1-2 years. Talk to your health care provider about how often you should have regular mammograms. Talk with your health care provider about your test results, treatment options, and if necessary, the need for more tests. Vaccines  Your health care provider may recommend  certain vaccines, such as:  Influenza vaccine. This is recommended every year.  Tetanus, diphtheria, and acellular pertussis (Tdap, Td) vaccine. You may need a Td booster every 10 years.  Zoster vaccine. You may need this after age 9.  Pneumococcal 13-valent  conjugate (PCV13) vaccine. One dose is recommended after age 81.  Pneumococcal polysaccharide (PPSV23) vaccine. One dose is recommended after age 45. Talk to your health care provider about which screenings and vaccines you need and how often you need them. This information is not intended to replace advice given to you by your health care provider. Make sure you discuss any questions you have with your health care provider. Document Released: 08/11/2015 Document Revised: 04/03/2016 Document Reviewed: 05/16/2015 Elsevier Interactive Patient Education  2017 [employer] Prevention in the Home Falls can cause injuries. They can happen to people of all ages. There are many things you can do to make your home safe and to help prevent falls. What can I do on the outside of my home?  Regularly fix the edges of walkways and driveways and fix any cracks.  Remove anything that might make you trip as you walk through a door, such as a raised step or threshold.  Trim any bushes or trees on the path to your home.  Use bright outdoor lighting.  Clear any walking paths of anything that might make someone trip, such as rocks or tools.  Regularly check to see if handrails are loose or broken. Make sure that both sides of any steps have handrails.  Any raised decks and porches should have guardrails on the edges.  Have any leaves, snow, or ice cleared regularly.  Use sand or salt on walking paths during winter.  Clean up any spills in your garage right away. This includes oil or grease spills. What can I do in the bathroom?  Use night lights.  Install grab bars by the toilet and in the tub and shower. Do not use towel bars as grab bars.  Use non-skid mats or decals in the tub or shower.  If you need to sit down in the shower, use a plastic, non-slip stool.  Keep the floor dry. Clean up any water that spills on the floor as soon as it happens.  Remove soap buildup in the tub or  shower regularly.  Attach bath mats securely with double-sided non-slip rug tape.  Do not have throw rugs and other things on the floor that can make you trip. What can I do in the bedroom?  Use night lights.  Make sure that you have a light by your bed that is easy to reach.  Do not use any sheets or blankets that are too big for your bed. They should not hang down onto the floor.  Have a firm chair that has side arms. You can use this for support while you get dressed.  Do not have throw rugs and other things on the floor that can make you trip. What can I do in the kitchen?  Clean up any spills right away.  Avoid walking on wet floors.  Keep items that you use a lot in easy-to-reach places.  If you need to reach something above you, use a strong step stool that has a grab bar.  Keep electrical cords out of the way.  Do not use floor polish or wax that makes floors slippery. If you must use wax, use non-skid floor wax.  Do not have throw rugs and other  things on the floor that can make you trip. What can I do with my stairs?  Do not leave any items on the stairs.  Make sure that there are handrails on both sides of the stairs and use them. Fix handrails that are broken or loose. Make sure that handrails are as long as the stairways.  Check any carpeting to make sure that it is firmly attached to the stairs. Fix any carpet that is loose or worn.  Avoid having throw rugs at the top or bottom of the stairs. If you do have throw rugs, attach them to the floor with carpet tape.  Make sure that you have a light switch at the top of the stairs and the bottom of the stairs. If you do not have them, ask someone to add them for you. What else can I do to help prevent falls?  Wear shoes that:  Do not have high heels.  Have rubber bottoms.  Are comfortable and fit you well.  Are closed at the toe. Do not wear sandals.  If you use a stepladder:  Make sure that it is fully  opened. Do not climb a closed stepladder.  Make sure that both sides of the stepladder are locked into place.  Ask someone to hold it for you, if possible.  Clearly mark and make sure that you can see:  Any grab bars or handrails.  First and last steps.  Where the edge of each step is.  Use tools that help you move around (mobility aids) if they are needed. These include:  Canes.  Walkers.  Scooters.  Crutches.  Turn on the lights when you go into a dark area. Replace any light bulbs as soon as they burn out.  Set up your furniture so you have a clear path. Avoid moving your furniture around.  If any of your floors are uneven, fix them.  If there are any pets around you, be aware of where they are.  Review your medicines with your doctor. Some medicines can make you feel dizzy. This can increase your chance of falling. Ask your doctor what other things that you can do to help prevent falls. This information is not intended to replace advice given to you by your health care provider. Make sure you discuss any questions you have with your health care provider. Document Released: 05/11/2009 Document Revised: 12/21/2015 Document Reviewed: 08/19/2014 Elsevier Interactive Patient Education  2017 [employer].

## 2019-09-22 NOTE — Progress Notes (Addendum)
This visit occurred during the SARS-CoV-2 public health emergency.  Safety protocols were in place, including screening questions prior to the visit, additional usage of staff PPE, and extensive cleaning of exam room while observing appropriate contact time as indicated for disinfecting solutions.  Subjective:     Patient ID: [name] , female    DOB: [birth year] , 84 y.o.   MRN: [id number]   Chief Complaint  Patient presents with  . Hospitalization Follow-up    HPI 1-Pt is here for hospital FU DOA 2/18 DOD 2/21 2-Pt was hospitalized due to [place] having a stroke since she was having R arm weakness, but per Neurlogoist was thought to be proximal R arm weakness due to arthritis since distally her strength was same as the other side.  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 No mass effect or hemorrhage evident. . She has nurses coming to see her at home. Has not been eating much since she does not have an appetite. Her sever N/V has improved with medication. Today she states her neb machine broke down and has been SOB and wheezing this week. Denies fever, chills or sweats.  3-She has been having dyusia x 2 days, with some frequency. Has not seen blood in her urine.   Past Medical History:  Diagnosis Date  . Allergy   . Anemia    "when I was young"  . Anginal pain ([place])   . Anxiety   . Arthritis    "all over my body"   . Chest pain 04/07/2014  . Chronic diastolic heart failure, NYHA class 1 ([place]) 01/22/2015  . Chronic lower back pain   . Chronic steroid use 12/11/2012  . COPD (chronic obstructive pulmonary disease) ([place]) 01/09/2017  . 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 Elevated lactic acid level 01/22/2015  . Essential hypertension 06/15/2018  . Frequent UTI    "recently" (06/14/2015)  . Gastric mass   . GERD (gastroesophageal reflux disease)   .  Headache(784.0)    "weekly; wake up w/them " (110/19/2018)  . Heart murmur   . High cholesterol   . History of blood transfusion ~ 1954   "related to my periods"  . Migraine    "once/month maybe" (05/16/2017)  . Multiple falls 06/14/2015  . Noncompliance 12/11/2012  . OSA (obstructive sleep apnea)    "I think I'm using a BiPAP" (06/14/2015)  . Sepsis ([place]) 01/09/2017  . Syncope 08/27/2019  . Syncope and collapse 12/11/2012  . Tachycardia with heart rate 100-120 beats per minute 04/08/2014  . Type II diabetes mellitus ([place])   . Uncontrolled type 2 diabetes mellitus with complication ([facility])      Family History  Problem Relation Age of Onset  . Hypertension Mother   . Heart attack Father   . Diabetes Mellitus II Sister   . Hypertension Sister   . Cancer - Other Sister   . Migraines Niece   . Colon cancer Neg Hx      Current Outpatient Medications:  .  albuterol (ACCUNEB) 0.63 MG/3ML nebulizer solution, Take 2 ampules by nebulization every 6 (six) hours as needed for wheezing., Disp: , Rfl:  .  aspirin EC 81 MG tablet, Take 1 tablet (81 mg total) by mouth every Monday, Wednesday, and Friday., Disp: , Rfl:  .  atorvastatin (LIPITOR) 20 MG tablet, Take 1 tablet (20 mg total) by mouth daily. Take 1  Tablet by mouth daily., Disp: 30 tablet, Rfl: 0 .  clopidogrel (PLAVIX) 75 MG tablet, Take 1 tablet (75 mg total) by mouth daily., Disp: 30 tablet, Rfl: 0 .  dicyclomine (BENTYL) 10 MG capsule, Take 1 capsule (10 mg total) by mouth every 8 (eight) hours as needed for spasms., Disp: 60 capsule, Rfl: 3 .  docusate sodium (COLACE) 100 MG capsule, Take 1 capsule (100 mg total) by mouth 2 (two) times daily., Disp: 10 capsule, Rfl: 0 .  feeding supplement, ENSURE ENLIVE, (ENSURE ENLIVE) LIQD, Take 237 mLs by mouth 3 (three) times daily between meals., Disp: 237 mL, Rfl: 12 .  glucose blood (ONETOUCH VERIO) test strip, Use to check blood sugar 3 to 4 times a day before meals, Disp: 100 each, Rfl:  12 .  hydrOXYzine (ATARAX/VISTARIL) 10 MG tablet, 1 q 8h prn itching (Patient taking differently: Take 10 mg by mouth every 8 (eight) hours as needed for itching. ), Disp: 30 tablet, Rfl: 0 .  metoCLOPramide (REGLAN) 5 MG tablet, Take 1 tablet (5 mg total) by mouth 3 (three) times daily before meals., Disp: 90 tablet, Rfl: 3 .  metoprolol tartrate (LOPRESSOR) 25 MG tablet, Take 1 tablet (25 mg total) by mouth 2 (two) times daily., Disp: 60 tablet, Rfl: 11 .  mometasone-formoterol (DULERA) 200-5 MCG/ACT AERO, Inhale 2 puffs into the lungs 2 (two) times daily., Disp: , Rfl:  .  Multiple Vitamin (MULTIVITAMIN WITH MINERALS) TABS tablet, Take 1 tablet by mouth daily., Disp: 30 tablet, Rfl: 0 .  ondansetron (ZOFRAN ODT) 4 MG disintegrating tablet, Take 1 tablet (4 mg total) by mouth every 8 (eight) hours as needed for up to 30 doses for nausea or vomiting., Disp: 30 tablet, Rfl: 0 .  simethicone (GAS-X EXTRA STRENGTH) 125 MG chewable tablet, Chew 125 mg by mouth every 6 (six) hours as needed for flatulence., Disp: , Rfl:  .  vitamin B-12 1000 MCG tablet, Take 1 tablet (1,000 mcg total) by mouth daily., Disp: 30 tablet, Rfl: 0 .  folic acid (FOLVITE) 1 MG tablet, Take 1 tablet (1 mg total) by mouth daily., Disp: 100 tablet, Rfl: 3   Allergies  Allergen Reactions  . Lactose Intolerance (Gi)      Review of Systems  Constitutional: Positive for appetite change and fatigue. Negative for chills, diaphoresis and fever.  HENT: Negative for congestion, postnasal drip, rhinorrhea, sore throat and trouble swallowing.   Respiratory: Positive for shortness of breath and wheezing. Negative for chest tightness.   Cardiovascular: Negative for chest pain and leg swelling.  Gastrointestinal: Negative for abdominal pain.  Genitourinary: Negative for flank pain.  Musculoskeletal: Positive for arthralgias.  Skin: Negative for rash.  Neurological: Positive for weakness. Negative for dizziness and syncope.       Has  weakness of R arm     There were no vitals filed for this visit. There is no height or weight on file to calculate BMI.   Objective:  Physical Exam Constitutional:      Comments: cachectic   HENT:     Right Ear: External ear normal.     Left Ear: External ear normal.     Nose: Nose normal.  Eyes:     General: No scleral icterus.    Conjunctiva/sclera: Conjunctivae normal.  Cardiovascular:     Rate and Rhythm: Normal rate and regular rhythm.  Pulmonary:     Effort: Respiratory distress present.     Breath sounds: Wheezing present. No rhonchi or rales.  Musculoskeletal:        General: No swelling.     Cervical back: Neck supple.     Right lower leg: No edema.     Left lower leg: No edema.     Comments: R ARM- only able to raise it 45 degrees, strength 2/5. Cant move her fingers and spread her hand wide open.   Skin:    General: Skin is warm and dry.     Findings: No rash.  Neurological:     Mental Status: She is alert.     Gait: Gait normal.     Comments: She is oriented to place R shoulder strength 4/5, R 5/5. Forearm strength 5/5 bilaterally.  Gait is slow, but normal.   Psychiatric:        Mood and Affect: Mood normal.        Behavior: Behavior normal.        Thought Content: Thought content normal.     Assessment And Plan:    1. Burning with urination -acute - POCT Urinalysis Dipstick (81002) - Culture, Urine  2. Acute cystitis without hematuria- acute. Was placed on Keflex. We will inform her when the culture result is back.  - Culture, Urine  3. Hypomagnesemia- unknown status as of now, has had it low. If still low I will need to increase her dose.  - Magnesium  4. Megaloblastic anemia due to decreased intake of vitamin 123456- her folic acid is low and 123456 in the low normal. I sent rx for folic acid to start.   5. Other megaloblastic anemia- newher folic acid is low and 123456 in the low normal. I sent rx for folic acid to start.  6.- R arm weakness seems  related to R shoulder arthritis and not CVA per Hospitalist.  7. Asthma exacerbation- improved with neb treatment from here. We gave her a neb machine and samples of Duoneb to use at home.    Fu as scheduled on 3/4  Scarlettrose Costilow RODRIGUEZ-SOUTHWORTH, PA-C    THE PATIENT IS ENCOURAGED TO PRACTICE SOCIAL DISTANCING DUE TO THE COVID-19 PANDEMIC.

## 2019-09-22 NOTE — Progress Notes (Signed)
This visit occurred during the SARS-CoV-2 public health emergency.  Safety protocols were in place, including screening questions prior to the visit, additional usage of staff PPE, and extensive cleaning of exam room while observing appropriate contact time as indicated for disinfecting solutions.  Subjective:   Severine Veth is a 84 y.o. female who presents for Medicare Annual (Subsequent) preventive examination.  Review of Systems:  n/a Cardiac Risk Factors include: advanced age (>3men, >76 women);diabetes mellitus;hypertension     Objective:     Vitals: BP 118/72 (BP Location: Left Arm, Patient Position: Sitting)   Pulse 100   Temp 98.2 F (36.8 C) (Oral)   There is no height or weight on file to calculate BMI.  Advanced Directives 09/22/2019 09/16/2019 09/06/2019 08/27/2019 08/27/2019 08/13/2019 08/01/2019  Does Patient Have a Medical Advance Directive? No No No - No No No  Type of Advance Directive - - - - - - -  Does patient want to make changes to medical advance directive? - - - - - - -  Copy of [place] in Chart? - - - - - - -  Would patient like information on creating a medical advance directive? Yes (MAU/Ambulatory/Procedural Areas - Information given) No - Patient declined - No - Patient declined No - Patient declined No - Patient declined No - Patient declined    Tobacco Social History   Tobacco Use  Smoking Status Former Smoker  . Types: Cigarettes  Smokeless Tobacco Never Used  Tobacco Comment   05/16/2017"1 pack of cigarettes would last 3-4 months; haven't had any cigarettes for 40 years"     Counseling given: Not Answered Comment: 05/16/2017"1 pack of cigarettes would last 3-4 months; haven't had any cigarettes for 40 years"   Clinical Intake:  Pre-visit preparation completed: Yes  Pain : No/denies pain     Nutritional Risks: None Diabetes: Yes     Interpreter Needed?: No  Information entered by :: NAllen LPN  Past Medical  History:  Diagnosis Date  . Allergy   . Anemia    "when I was young"  . Anginal pain ([place])   . Anxiety   . Arthritis    "all over my body"   . Chest pain 04/07/2014  . Chronic diastolic heart failure, NYHA class 1 ([place]) 01/22/2015  . Chronic lower back pain   . Chronic steroid use 12/11/2012  . COPD (chronic obstructive pulmonary disease) ([place]) 01/09/2017  . 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 Elevated lactic acid level 01/22/2015  . Essential hypertension 06/15/2018  . Frequent UTI    "recently" (06/14/2015)  . Gastric mass   . GERD (gastroesophageal reflux disease)   . Headache(784.0)    "weekly; wake up w/them " (110/19/2018)  . Heart murmur   . High cholesterol   . History of blood transfusion ~ 1954   "related to my periods"  . Migraine    "once/month maybe" (05/16/2017)  . Multiple falls 06/14/2015  . Noncompliance 12/11/2012  . OSA (obstructive sleep apnea)    "I think I'm using a BiPAP" (06/14/2015)  . Sepsis ([place]) 01/09/2017  . Syncope 08/27/2019  . Syncope and collapse 12/11/2012  . Tachycardia with heart rate 100-120 beats per minute 04/08/2014  . Type II diabetes mellitus ([place])   . Uncontrolled type 2 diabetes mellitus with complication [facility])    Past Surgical History:  Procedure Laterality Date  . APPENDECTOMY  1970   Archie Endo 12/12/2010  . BACK SURGERY    .  BIOPSY  05/25/2019   Procedure: BIOPSY;  Surgeon: Rush Landmark Telford Nab., MD;  Location: [facility];  Service: Gastroenterology;;  . [place]   Archie Endo 12/12/2010  . ESOPHAGOGASTRODUODENOSCOPY N/A 05/25/2019   Procedure: ESOPHAGOGASTRODUODENOSCOPY (EGD);  Surgeon: Irving Copas., MD;  Location: [place] [facility];  Service: Gastroenterology;  Laterality: N/A;  . EUS N/A 10/16/2017   Procedure: UPPER ENDOSCOPIC ULTRASOUND (EUS) RADIAL;  Surgeon: Milus Banister, MD;  Location: [facility];  Service: Endoscopy;  Laterality: N/A;  . FRACTURE SURGERY     . LAPAROSCOPIC CHOLECYSTECTOMY  1990's  . LUMBAR MICRODISCECTOMY Left 09/2006   L5-S1/notes 12/12/2010  . MULTIPLE EXTRACTIONS WITH ALVEOLOPLASTY Bilateral 06/13/2017   Procedure: MULTIPLE EXTRACTION WITH ALVEOLOPLASTY;  Surgeon: Diona Browner, DDS;  Location: [facility];  Service: Oral Surgery;  Laterality: Bilateral;  . MULTIPLE TOOTH EXTRACTIONS    . ORIF SHOULDER FRACTURE Left ~ 2009   "put cement down in it"   . TONSILLECTOMY  1950's?  . TOTAL ABDOMINAL HYSTERECTOMY  1979   Archie Endo 12/12/2010   Family History  Problem Relation Age of Onset  . Hypertension Mother   . Heart attack Father   . Diabetes Mellitus II Sister   . Hypertension Sister   . Cancer - Other Sister   . Migraines Niece   . Colon cancer Neg Hx    Social History   Socioeconomic History  . Marital status: Widowed    Spouse name: Not on file  . Number of children: Not on file  . Years of education: Not on file  . Highest education level: Not on file  Occupational History  . Occupation: retired  Tobacco Use  . Smoking status: Former Smoker    Types: Cigarettes  . Smokeless tobacco: Never Used  . Tobacco comment: 05/16/2017"1 pack of cigarettes would last 3-4 months; haven't had any cigarettes for 40 years"  Substance and Sexual Activity  . Alcohol use: Not Currently    Comment: 05/16/2017 "used to drink beer in the 1990's; don't drink at all now"  . Drug use: No  . Sexual activity: Not Currently    Birth control/protection: Abstinence  Other Topics Concern  . Not on file  Social History Narrative   Lives at home alone    Her niece comes to check on her everyday. Her son comes by every other day as well   Right handed   Caffeine: none   Social Determinants of Health   Financial Resource Strain: Low Risk   . Difficulty of Paying Living Expenses: Not hard at all  Food Insecurity: No Food Insecurity  . Worried About Charity fundraiser in the Last Year: Never true  . Ran Out of Food in the Last Year:  Never true  Transportation Needs: No Transportation Needs  . Lack of Transportation (Medical): No  . Lack of Transportation (Non-Medical): No  Physical Activity: Insufficiently Active  . Days of Exercise per Week: 2 days  . Minutes of Exercise per Session: 30 min  Stress: Stress Concern Present  . Feeling of Stress : Very much  Social Connections:   . Frequency of Communication with Friends and Family: Not on file  . Frequency of Social Gatherings with Friends and Family: Not on file  . Attends Religious Services: Not on file  . Active Member of Clubs or Organizations: Not on file  . Attends Archivist Meetings: Not on file  . Marital Status: Not on file    Outpatient Encounter Medications as  of 09/22/2019  Medication Sig  . albuterol (ACCUNEB) 0.63 MG/3ML nebulizer solution Take 2 ampules by nebulization every 6 (six) hours as needed for wheezing.  Marland Kitchen aspirin EC 81 MG tablet Take 1 tablet (81 mg total) by mouth every Monday, Wednesday, and Friday.  Marland Kitchen atorvastatin (LIPITOR) 20 MG tablet Take 1 tablet (20 mg total) by mouth daily. Take 1 Tablet by mouth daily.  . clopidogrel (PLAVIX) 75 MG tablet Take 1 tablet (75 mg total) by mouth daily.  Marland Kitchen dicyclomine (BENTYL) 10 MG capsule Take 1 capsule (10 mg total) by mouth every 8 (eight) hours as needed for spasms.  Marland Kitchen docusate sodium (COLACE) 100 MG capsule Take 1 capsule (100 mg total) by mouth 2 (two) times daily.  . feeding supplement, ENSURE ENLIVE, (ENSURE ENLIVE) LIQD Take 237 mLs by mouth 3 (three) times daily between meals.  Marland Kitchen glucose blood (ONETOUCH VERIO) test strip Use to check blood sugar 3 to 4 times a day before meals  . hydrOXYzine (ATARAX/VISTARIL) 10 MG tablet 1 q 8h prn itching (Patient taking differently: Take 10 mg by mouth every 8 (eight) hours as needed for itching. )  . metoCLOPramide (REGLAN) 5 MG tablet Take 1 tablet (5 mg total) by mouth 3 (three) times daily before meals.  . metoprolol tartrate (LOPRESSOR) 25  MG tablet Take 1 tablet (25 mg total) by mouth 2 (two) times daily.  . mometasone-formoterol (DULERA) 200-5 MCG/ACT AERO Inhale 2 puffs into the lungs 2 (two) times daily.  . Multiple Vitamin (MULTIVITAMIN WITH MINERALS) TABS tablet Take 1 tablet by mouth daily.  . ondansetron (ZOFRAN ODT) 4 MG disintegrating tablet Take 1 tablet (4 mg total) by mouth every 8 (eight) hours as needed for up to 30 doses for nausea or vomiting.  . pneumococcal 13-valent conjugate vaccine (PREVNAR 13) SUSP injection Inject 0.5 mLs into the muscle tomorrow at 10 am for 1 dose.  . simethicone (GAS-X EXTRA STRENGTH) 125 MG chewable tablet Chew 125 mg by mouth every 6 (six) hours as needed for flatulence.  . Tdap (BOOSTRIX) 5-2.5-18.5 LF-MCG/0.5 injection Inject 0.5 mLs into the muscle once for 1 dose.  . vitamin B-12 1000 MCG tablet Take 1 tablet (1,000 mcg total) by mouth daily.   No facility-administered encounter medications on file as of 09/22/2019.    Activities of Daily Living In your present state of health, do you have any difficulty performing the following activities: 09/22/2019 09/17/2019  Hearing? N N  Vision? Y N  Comment has blurriness and spots sometimes -  Difficulty concentrating or making decisions? Y N  Walking or climbing stairs? N Y  Dressing or bathing? Y N  Doing errands, shopping? Tempie Donning  Preparing Food and eating ? Y -  Using the Toilet? N -  In the past six months, have you accidently leaked urine? N -  Do you have problems with loss of bowel control? Y -  Comment on ocassion -  Managing your Medications? Y -  Managing your Finances? Y -  Housekeeping or managing your Housekeeping? Y -  Some recent data might be hidden    Patient Care Team: Rodriguez-Southworth, Sandrea Matte as PCP - General (Internal Medicine) Daneen Schick as Social Worker Little, Claudette Stapler, RN as Case Manager    Assessment:   This is a routine wellness examination for Pang.  Exercise Activities and Dietary  recommendations Current Exercise Habits: Home exercise routine, Type of exercise: walking, Time (Minutes): 30, Frequency (Times/Week): 2, Weekly Exercise (Minutes/Week): 60  Goals    . Patient Stated (pt-stated)     Just wants to keep on for her family.    . Patient Stated     09/22/2019, wants breathing to get better       Fall Risk Fall Risk  09/22/2019 08/11/2019 06/17/2019 04/29/2019 04/22/2019  Falls in the past year? 1 0 1 0 0  Comment loses balance - - - -  Number falls in past yr: 1 - 1 - -  Comment - - - - -  Injury with Fall? 1 - 1 - -  Comment - - - - -  Risk for fall due to : Medication side effect;Impaired balance/gait;History of fall(s) - - - -  Follow up Falls evaluation completed;Education provided;Falls prevention discussed - - - -   Is the patient's home free of loose throw rugs in walkways, pet beds, electrical cords, etc?   yes      Grab bars in the bathroom? yes      Handrails on the stairs?   yes      Adequate lighting?   yes  Timed Get Up and Go performed: n/a  Depression Screen PHQ 2/9 Scores 09/22/2019 08/11/2019 04/08/2019 03/25/2019  PHQ - 2 Score 0 0 0 0  PHQ- 9 Score - - - -     Cognitive Function     6CIT Screen 09/22/2019 09/10/2018  What Year? 0 points 0 points  What month? 3 points 3 points  What time? 0 points 0 points  Count back from 20 4 points 2 points  Months in reverse 4 points 4 points  Repeat phrase 10 points 10 points  Total Score 21 19    Immunization History  Administered Date(s) Administered  . Influenza, High Dose Seasonal PF 05/18/2017, 04/08/2019  . Influenza,inj,Quad PF,6+ Mos 04/08/2014  . Influenza-Unspecified 04/14/2015, 04/28/2018  . Pneumococcal Polysaccharide-23 12/11/2012    Qualifies for Shingles Vaccine? yes  Screening Tests Health Maintenance  Topic Date Due  . OPHTHALMOLOGY EXAM  03/18/1948  . TETANUS/TDAP  03/18/1957  . PNA vac Low Risk Adult (2 of 2 - PCV13) 12/11/2013  . FOOT EXAM  09/11/2019  .  URINE MICROALBUMIN  09/11/2019  . HEMOGLOBIN A1C  03/16/2020  . COLONOSCOPY  09/22/2020  . INFLUENZA VACCINE  Completed  . DEXA SCAN  Completed    Cancer Screenings: Lung: Low Dose CT Chest recommended if Age 50-80 years, 30 pack-year currently smoking OR have quit w/in 15years. Patient does not qualify. Breast:  Up to date on Mammogram? Yes   Up to date of Bone Density/Dexa? Yes Colorectal: not required  Additional Screenings: : Hepatitis C Screening: n/a     Plan:    Patient wants breathing to get better.   I have personally reviewed and noted the following in the patient's chart:   . Medical and social history . Use of alcohol, tobacco or illicit drugs  . Current medications and supplements . Functional ability and status . Nutritional status . Physical activity . Advanced directives . List of other physicians . Hospitalizations, surgeries, and ER visits in previous 12 months . Vitals . Screenings to include cognitive, depression, and falls . Referrals and appointments  In addition, I have reviewed and discussed with patient certain preventive protocols, quality metrics, and best practice recommendations. A written personalized care plan for preventive services as well as general preventive health recommendations were provided to patient.     Kellie Simmering, LPN  [phone]

## 2019-09-23 LAB — CMP14 + ANION GAP
ALT: 22 IU/L (ref 0–32)
AST: 48 IU/L — ABNORMAL HIGH (ref 0–40)
Albumin/Globulin Ratio: 1.1 — ABNORMAL LOW (ref 1.2–2.2)
Albumin: 3.5 g/dL — ABNORMAL LOW (ref 3.6–4.6)
Alkaline Phosphatase: 62 IU/L (ref 39–117)
Anion Gap: 12 mmol/L (ref 10.0–18.0)
BUN/Creatinine Ratio: 16 (ref 12–28)
BUN: 10 mg/dL (ref 8–27)
Bilirubin Total: 0.2 mg/dL (ref 0.0–1.2)
CO2: 25 mmol/L (ref 20–29)
Calcium: 9.2 mg/dL (ref 8.7–10.3)
Chloride: 102 mmol/L (ref 96–106)
Creatinine, Ser: 0.62 mg/dL (ref 0.57–1.00)
GFR calc Af Amer: 98 mL/min/{1.73_m2} (ref >59)
GFR calc non Af Amer: 85 mL/min/{1.73_m2} (ref >59)
Globulin, Total: 3.3 g/dL (ref 1.5–4.5)
Glucose: 188 mg/dL — ABNORMAL HIGH (ref 65–99)
Potassium: 4 mmol/L (ref 3.5–5.2)
Sodium: 139 mmol/L (ref 134–144)
Total Protein: 6.8 g/dL (ref 6.0–8.5)

## 2019-09-23 LAB — MAGNESIUM: Magnesium: 1.6 mg/dL (ref 1.6–2.3)

## 2019-09-23 LAB — IRON: Iron: 44 ug/dL (ref 27–139)

## 2019-09-25 LAB — URINE CULTURE

## 2019-09-27 ENCOUNTER — Telehealth: Payer: Self-pay

## 2019-09-27 ENCOUNTER — Other Ambulatory Visit: Payer: Self-pay | Admitting: *Deleted

## 2019-09-27 ENCOUNTER — Other Ambulatory Visit: Payer: Self-pay | Admitting: Internal Medicine

## 2019-09-27 ENCOUNTER — Encounter: Payer: Self-pay | Admitting: Internal Medicine

## 2019-09-27 MED ORDER — CIPROFLOXACIN HCL 250 MG PO TABS: 250.0000 mg | ORAL_TABLET | Freq: Two times a day (BID) | ORAL | 0 refills | Status: DC

## 2019-09-27 NOTE — Telephone Encounter (Signed)
Spoke w/pt niece Bethena Roys gave her provider message  Rodriguez-Southworth, Sandrea Matte  Candiss Norse T, CMA  Please call pt and inform her that her urine culture is positive, but the antibiotic I placed her on will not take care of it, and I sent Cipro instead. Also her blood work shows her protein in her blood in low and needs to try to eat more proteins like meats, fish, beans, peanut butter. Her sugar is high, so needs to avoid soda's, sweets and juices. Her magnesium is back to normal, and kidney function is normal.   Niece stated pt is not really eating, she ate apple sauce yesterday and that was all they can get her to eat. They do have aids coming in to help but she's not sure how much longer they will still be there working with her.

## 2019-09-27 NOTE — Patient Outreach (Signed)
[facility]) Care Management  09/27/2019  [name] 25-Dec-1935 [id number]   RED ON EMMI ALERT - Stroke Day # 6 Date: 2/28 Red Alert Reason: New questions/problems about meds   Outreach attempt #1, unsuccessful.  Female answering phone state member is resting and unable to come to phone.  Request made for member to call this care manager back.   Plan: RN CM will send unsuccessful outreach letter and follow up within the next 3-4 business days.

## 2019-09-27 NOTE — Telephone Encounter (Signed)
Patients husband [name] is calling in requesting orders for a  in home nurse to come out to the home.  CB# [phone] or [phone]

## 2019-09-27 NOTE — Progress Notes (Signed)
Please call pt and inform her that her urine culture is positive, but the antibiotic I placed her on will not take care of it, and I sent Cipro instead. Also her blood work shows her protein in her blood in low and needs to try to eat more proteins like meats, fish, beans, peanut butter. Her sugar is high, so needs to avoid soda's, sweets and juices. Her magnesium is back to normal, and kidney function is normal.

## 2019-09-28 ENCOUNTER — Emergency Department (HOSPITAL_COMMUNITY): Payer: Medicare Other

## 2019-09-28 ENCOUNTER — Inpatient Hospital Stay (HOSPITAL_COMMUNITY): Payer: Medicare Other

## 2019-09-28 ENCOUNTER — Encounter (HOSPITAL_COMMUNITY): Payer: Self-pay | Admitting: Internal Medicine

## 2019-09-28 ENCOUNTER — Inpatient Hospital Stay (HOSPITAL_COMMUNITY)
Admission: EM | Admit: 2019-09-28 | Discharge: 2019-10-05 | DRG: 689 | Disposition: A | Discharge status: Skilled Nursing Facility | Payer: Medicare Other | Attending: Internal Medicine | Admitting: Internal Medicine

## 2019-09-28 DIAGNOSIS — G92 Toxic encephalopathy: Secondary | ICD-10-CM | POA: Diagnosis present

## 2019-09-28 DIAGNOSIS — R32 Unspecified urinary incontinence: Secondary | ICD-10-CM | POA: Diagnosis present

## 2019-09-28 DIAGNOSIS — R7401 Elevation of levels of liver transaminase levels: Secondary | ICD-10-CM | POA: Diagnosis not present

## 2019-09-28 DIAGNOSIS — I110 Hypertensive heart disease with heart failure: Secondary | ICD-10-CM | POA: Diagnosis present

## 2019-09-28 DIAGNOSIS — G4733 Obstructive sleep apnea (adult) (pediatric): Secondary | ICD-10-CM | POA: Diagnosis present

## 2019-09-28 DIAGNOSIS — Z1624 Resistance to multiple antibiotics: Secondary | ICD-10-CM | POA: Diagnosis present

## 2019-09-28 DIAGNOSIS — Z66 Do not resuscitate: Secondary | ICD-10-CM | POA: Diagnosis present

## 2019-09-28 DIAGNOSIS — K219 Gastro-esophageal reflux disease without esophagitis: Secondary | ICD-10-CM | POA: Diagnosis present

## 2019-09-28 DIAGNOSIS — E118 Type 2 diabetes mellitus with unspecified complications: Secondary | ICD-10-CM | POA: Diagnosis not present

## 2019-09-28 DIAGNOSIS — B9689 Other specified bacterial agents as the cause of diseases classified elsewhere: Secondary | ICD-10-CM | POA: Diagnosis present

## 2019-09-28 DIAGNOSIS — Z681 Body mass index (BMI) 19 or less, adult: Secondary | ICD-10-CM

## 2019-09-28 DIAGNOSIS — D84821 Immunodeficiency due to drugs: Secondary | ICD-10-CM | POA: Diagnosis present

## 2019-09-28 DIAGNOSIS — E876 Hypokalemia: Secondary | ICD-10-CM | POA: Diagnosis present

## 2019-09-28 DIAGNOSIS — E785 Hyperlipidemia, unspecified: Secondary | ICD-10-CM | POA: Diagnosis present

## 2019-09-28 DIAGNOSIS — Z8249 Family history of ischemic heart disease and other diseases of the circulatory system: Secondary | ICD-10-CM

## 2019-09-28 DIAGNOSIS — Z7902 Long term (current) use of antithrombotics/antiplatelets: Secondary | ICD-10-CM

## 2019-09-28 DIAGNOSIS — E739 Lactose intolerance, unspecified: Secondary | ICD-10-CM | POA: Diagnosis present

## 2019-09-28 DIAGNOSIS — R636 Underweight: Secondary | ICD-10-CM | POA: Diagnosis present

## 2019-09-28 DIAGNOSIS — G9340 Encephalopathy, unspecified: Secondary | ICD-10-CM | POA: Diagnosis present

## 2019-09-28 DIAGNOSIS — M069 Rheumatoid arthritis, unspecified: Secondary | ICD-10-CM | POA: Diagnosis present

## 2019-09-28 DIAGNOSIS — R4182 Altered mental status, unspecified: Secondary | ICD-10-CM | POA: Diagnosis not present

## 2019-09-28 DIAGNOSIS — Z7982 Long term (current) use of aspirin: Secondary | ICD-10-CM

## 2019-09-28 DIAGNOSIS — Z87891 Personal history of nicotine dependence: Secondary | ICD-10-CM

## 2019-09-28 DIAGNOSIS — D649 Anemia, unspecified: Secondary | ICD-10-CM | POA: Diagnosis present

## 2019-09-28 DIAGNOSIS — Z8673 Personal history of transient ischemic attack (TIA), and cerebral infarction without residual deficits: Secondary | ICD-10-CM | POA: Diagnosis not present

## 2019-09-28 DIAGNOSIS — Z20822 Contact with and (suspected) exposure to covid-19: Secondary | ICD-10-CM | POA: Diagnosis present

## 2019-09-28 DIAGNOSIS — R627 Adult failure to thrive: Secondary | ICD-10-CM | POA: Diagnosis present

## 2019-09-28 DIAGNOSIS — E119 Type 2 diabetes mellitus without complications: Secondary | ICD-10-CM | POA: Diagnosis present

## 2019-09-28 DIAGNOSIS — I5032 Chronic diastolic (congestive) heart failure: Secondary | ICD-10-CM | POA: Diagnosis present

## 2019-09-28 DIAGNOSIS — Z79899 Other long term (current) drug therapy: Secondary | ICD-10-CM

## 2019-09-28 DIAGNOSIS — R0602 Shortness of breath: Secondary | ICD-10-CM | POA: Diagnosis not present

## 2019-09-28 DIAGNOSIS — E871 Hypo-osmolality and hyponatremia: Secondary | ICD-10-CM | POA: Diagnosis present

## 2019-09-28 DIAGNOSIS — F419 Anxiety disorder, unspecified: Secondary | ICD-10-CM | POA: Diagnosis present

## 2019-09-28 DIAGNOSIS — E7800 Pure hypercholesterolemia, unspecified: Secondary | ICD-10-CM | POA: Diagnosis present

## 2019-09-28 DIAGNOSIS — N390 Urinary tract infection, site not specified: Principal | ICD-10-CM | POA: Diagnosis present

## 2019-09-28 DIAGNOSIS — R000 Tachycardia, unspecified: Secondary | ICD-10-CM | POA: Diagnosis present

## 2019-09-28 DIAGNOSIS — Z833 Family history of diabetes mellitus: Secondary | ICD-10-CM

## 2019-09-28 DIAGNOSIS — R109 Unspecified abdominal pain: Secondary | ICD-10-CM

## 2019-09-28 LAB — URINALYSIS, ROUTINE W REFLEX MICROSCOPIC
Bilirubin Urine: NEGATIVE
Glucose, UA: NEGATIVE mg/dL
Hgb urine dipstick: NEGATIVE
Ketones, ur: NEGATIVE mg/dL
Nitrite: NEGATIVE
Protein, ur: NEGATIVE mg/dL
Specific Gravity, Urine: 1.004 — ABNORMAL LOW (ref 1.005–1.030)
pH: 6 (ref 5.0–8.0)

## 2019-09-28 LAB — I-STAT CHEM 8, ED
BUN: 7 mg/dL — ABNORMAL LOW (ref 8–23)
Calcium, Ion: 1.11 mmol/L — ABNORMAL LOW (ref 1.15–1.40)
Chloride: 97 mmol/L — ABNORMAL LOW (ref 98–111)
Creatinine, Ser: 0.6 mg/dL (ref 0.44–1.00)
Glucose, Bld: 185 mg/dL — ABNORMAL HIGH (ref 70–99)
HCT: 36 % (ref 36.0–46.0)
Hemoglobin: 12.2 g/dL (ref 12.0–15.0)
Potassium: 3.4 mmol/L — ABNORMAL LOW (ref 3.5–5.1)
Sodium: 132 mmol/L — ABNORMAL LOW (ref 135–145)
TCO2: 26 mmol/L (ref 22–32)

## 2019-09-28 LAB — APTT: aPTT: 24 seconds (ref 24–36)

## 2019-09-28 LAB — RAPID URINE DRUG SCREEN, HOSP PERFORMED
Amphetamines: NOT DETECTED
Barbiturates: NOT DETECTED
Benzodiazepines: NOT DETECTED
Cocaine: NOT DETECTED
Opiates: NOT DETECTED
Tetrahydrocannabinol: NOT DETECTED

## 2019-09-28 LAB — COMPREHENSIVE METABOLIC PANEL
ALT: 23 U/L (ref 0–44)
AST: 43 U/L — ABNORMAL HIGH (ref 15–41)
Albumin: 3 g/dL — ABNORMAL LOW (ref 3.5–5.0)
Alkaline Phosphatase: 53 U/L (ref 38–126)
Anion gap: 13 (ref 5–15)
BUN: 7 mg/dL — ABNORMAL LOW (ref 8–23)
CO2: 20 mmol/L — ABNORMAL LOW (ref 22–32)
Calcium: 9 mg/dL (ref 8.9–10.3)
Chloride: 99 mmol/L (ref 98–111)
Creatinine, Ser: 0.78 mg/dL (ref 0.44–1.00)
GFR calc Af Amer: >60 mL/min (ref >60)
GFR calc non Af Amer: >60 mL/min (ref >60)
Glucose, Bld: 188 mg/dL — ABNORMAL HIGH (ref 70–99)
Potassium: 3.6 mmol/L (ref 3.5–5.1)
Sodium: 132 mmol/L — ABNORMAL LOW (ref 135–145)
Total Bilirubin: 0.6 mg/dL (ref 0.3–1.2)
Total Protein: 6.2 g/dL — ABNORMAL LOW (ref 6.5–8.1)

## 2019-09-28 LAB — CBC
HCT: 36.6 % (ref 36.0–46.0)
Hemoglobin: 11.4 g/dL — ABNORMAL LOW (ref 12.0–15.0)
MCH: 29.1 pg (ref 26.0–34.0)
MCHC: 31.1 g/dL (ref 30.0–36.0)
MCV: 93.4 fL (ref 80.0–100.0)
Platelets: 263 10*3/uL (ref 150–400)
RBC: 3.92 MIL/uL (ref 3.87–5.11)
RDW: 14.7 % (ref 11.5–15.5)
WBC: 5.6 10*3/uL (ref 4.0–10.5)
nRBC: 0 % (ref 0.0–0.2)

## 2019-09-28 LAB — DIFFERENTIAL
Abs Immature Granulocytes: 0.04 10*3/uL (ref 0.00–0.07)
Basophils Absolute: 0 10*3/uL (ref 0.0–0.1)
Basophils Relative: 1 %
Eosinophils Absolute: 0 10*3/uL (ref 0.0–0.5)
Eosinophils Relative: 1 %
Immature Granulocytes: 1 %
Lymphocytes Relative: 42 %
Lymphs Abs: 2.3 10*3/uL (ref 0.7–4.0)
Monocytes Absolute: 0.7 10*3/uL (ref 0.1–1.0)
Monocytes Relative: 12 %
Neutro Abs: 2.5 10*3/uL (ref 1.7–7.7)
Neutrophils Relative %: 43 %

## 2019-09-28 LAB — LACTIC ACID, PLASMA
Lactic Acid, Venous: 2.3 mmol/L (ref 0.5–1.9)
Lactic Acid, Venous: 2.3 mmol/L (ref 0.5–1.9)

## 2019-09-28 LAB — PROTIME-INR
INR: 1 (ref 0.8–1.2)
Prothrombin Time: 13 seconds (ref 11.4–15.2)

## 2019-09-28 LAB — SODIUM, URINE, RANDOM: Sodium, Ur: 17 mmol/L

## 2019-09-28 LAB — SARS CORONAVIRUS 2 (TAT 6-24 HRS): SARS Coronavirus 2: NEGATIVE

## 2019-09-28 LAB — CBG MONITORING, ED: Glucose-Capillary: 145 mg/dL — ABNORMAL HIGH (ref 70–99)

## 2019-09-28 LAB — OSMOLALITY: Osmolality: 295 mOsm/kg (ref 275–295)

## 2019-09-28 LAB — OSMOLALITY, URINE: Osmolality, Ur: 75 mOsm/kg — ABNORMAL LOW (ref 300–900)

## 2019-09-28 LAB — GLUCOSE, CAPILLARY
Glucose-Capillary: 119 mg/dL — ABNORMAL HIGH (ref 70–99)
Glucose-Capillary: 100 mg/dL — ABNORMAL HIGH (ref 70–99)

## 2019-09-28 LAB — ETHANOL: Alcohol, Ethyl (B): <10 mg/dL (ref <10)

## 2019-09-28 MED ORDER — METOPROLOL TARTRATE 25 MG PO TABS: 25.0000 mg | ORAL_TABLET | Freq: Two times a day (BID) | ORAL | Status: DC

## 2019-09-28 MED ORDER — CIPROFLOXACIN IN D5W 400 MG/200ML IV SOLN
400.0000 mg | Freq: Once | INTRAVENOUS | Status: AC
  Administered 2019-09-28: 400 mg via INTRAVENOUS
  Filled 2019-09-28: qty 200

## 2019-09-28 MED ORDER — SODIUM CHLORIDE 0.9% FLUSH
3.0000 mL | Freq: Two times a day (BID) | INTRAVENOUS | Status: DC
  Administered 2019-09-30 – 2019-10-05 (×10): 3 mL via INTRAVENOUS

## 2019-09-28 MED ORDER — MAGNESIUM SULFATE 2 GM/50ML IV SOLN
2.0000 g | Freq: Once | INTRAVENOUS | Status: AC
  Administered 2019-09-28: 2 g via INTRAVENOUS
  Filled 2019-09-28: qty 50

## 2019-09-28 MED ORDER — CIPROFLOXACIN IN D5W 400 MG/200ML IV SOLN
400.0000 mg | Freq: Two times a day (BID) | INTRAVENOUS | Status: DC
  Administered 2019-09-28 – 2019-10-05 (×13): 400 mg via INTRAVENOUS
  Filled 2019-09-28 (×13): qty 200

## 2019-09-28 MED ORDER — DOCUSATE SODIUM 100 MG PO CAPS
100.0000 mg | ORAL_CAPSULE | Freq: Two times a day (BID) | ORAL | Status: DC
  Administered 2019-09-28 – 2019-10-05 (×14): 100 mg via ORAL
  Filled 2019-09-28 (×14): qty 1

## 2019-09-28 MED ORDER — SODIUM CHLORIDE 0.9 % IV BOLUS
1000.0000 mL | Freq: Once | INTRAVENOUS | Status: AC
  Administered 2019-09-28: 1000 mL via INTRAVENOUS

## 2019-09-28 MED ORDER — METOCLOPRAMIDE HCL 10 MG PO TABS
5.0000 mg | ORAL_TABLET | Freq: Three times a day (TID) | ORAL | Status: DC
  Administered 2019-09-28 – 2019-10-05 (×21): 5 mg via ORAL
  Filled 2019-09-28 (×21): qty 1

## 2019-09-28 MED ORDER — MOMETASONE FURO-FORMOTEROL FUM 200-5 MCG/ACT IN AERO
2.0000 | INHALATION_SPRAY | Freq: Two times a day (BID) | RESPIRATORY_TRACT | Status: DC
  Administered 2019-09-28 – 2019-10-03 (×10): 2 via RESPIRATORY_TRACT
  Filled 2019-09-28: qty 8.8

## 2019-09-28 MED ORDER — SODIUM CHLORIDE 0.9 % IV SOLN: INTRAVENOUS | Status: DC

## 2019-09-28 MED ORDER — GLUCERNA SHAKE PO LIQD
237.0000 mL | Freq: Three times a day (TID) | ORAL | Status: DC
  Administered 2019-09-28 – 2019-10-01 (×5): 237 mL via ORAL
  Filled 2019-09-28 (×2): qty 237

## 2019-09-28 MED ORDER — FOLIC ACID 1 MG PO TABS
1.0000 mg | ORAL_TABLET | Freq: Every day | ORAL | Status: DC
  Administered 2019-09-29 – 2019-10-05 (×7): 1 mg via ORAL
  Filled 2019-09-28 (×7): qty 1

## 2019-09-28 MED ORDER — DICYCLOMINE HCL 10 MG PO CAPS
10.0000 mg | ORAL_CAPSULE | Freq: Three times a day (TID) | ORAL | Status: DC | PRN
  Administered 2019-09-30: 10 mg via ORAL
  Filled 2019-09-28 (×2): qty 1

## 2019-09-28 MED ORDER — ONDANSETRON HCL 4 MG/2ML IJ SOLN
4.0000 mg | Freq: Four times a day (QID) | INTRAMUSCULAR | Status: DC | PRN
  Administered 2019-10-01: 4 mg via INTRAVENOUS
  Filled 2019-09-28: qty 2

## 2019-09-28 MED ORDER — ASPIRIN EC 81 MG PO TBEC
81.0000 mg | DELAYED_RELEASE_TABLET | ORAL | Status: DC
  Administered 2019-09-29 – 2019-10-04 (×4): 81 mg via ORAL
  Filled 2019-09-28 (×5): qty 1

## 2019-09-28 MED ORDER — ATORVASTATIN CALCIUM 10 MG PO TABS
20.0000 mg | ORAL_TABLET | Freq: Every day | ORAL | Status: DC
  Administered 2019-09-29 – 2019-10-05 (×7): 20 mg via ORAL
  Filled 2019-09-28 (×8): qty 2

## 2019-09-28 MED ORDER — INSULIN ASPART 100 UNIT/ML ~~LOC~~ SOLN
0.0000 [IU] | Freq: Three times a day (TID) | SUBCUTANEOUS | Status: DC
  Administered 2019-10-01: 2 [IU] via SUBCUTANEOUS
  Administered 2019-10-02: 1 [IU] via SUBCUTANEOUS

## 2019-09-28 MED ORDER — ONDANSETRON HCL 4 MG PO TABS: 4.0000 mg | ORAL_TABLET | Freq: Four times a day (QID) | ORAL | Status: DC | PRN

## 2019-09-28 MED ORDER — SODIUM CHLORIDE 0.9 % IV SOLN: INTRAVENOUS | Status: AC

## 2019-09-28 MED ORDER — VITAMIN B-12 1000 MCG PO TABS
1000.0000 ug | ORAL_TABLET | Freq: Every day | ORAL | Status: DC
  Administered 2019-09-29 – 2019-10-05 (×7): 1000 ug via ORAL
  Filled 2019-09-28 (×7): qty 1

## 2019-09-28 MED ORDER — METOPROLOL TARTRATE 25 MG PO TABS
25.0000 mg | ORAL_TABLET | Freq: Two times a day (BID) | ORAL | Status: DC
  Administered 2019-09-28 – 2019-10-01 (×6): 25 mg via ORAL
  Filled 2019-09-28 (×8): qty 1

## 2019-09-28 MED ORDER — ACETAMINOPHEN 650 MG RE SUPP: 650.0000 mg | Freq: Four times a day (QID) | RECTAL | Status: DC | PRN

## 2019-09-28 MED ORDER — SIMETHICONE 80 MG PO CHEW
80.0000 mg | CHEWABLE_TABLET | Freq: Four times a day (QID) | ORAL | Status: DC | PRN
  Filled 2019-09-28: qty 1

## 2019-09-28 MED ORDER — ENOXAPARIN SODIUM 40 MG/0.4ML ~~LOC~~ SOLN
40.0000 mg | SUBCUTANEOUS | Status: DC
  Administered 2019-09-28 – 2019-10-05 (×8): 40 mg via SUBCUTANEOUS
  Filled 2019-09-28 (×8): qty 0.4

## 2019-09-28 MED ORDER — CLOPIDOGREL BISULFATE 75 MG PO TABS
75.0000 mg | ORAL_TABLET | Freq: Every day | ORAL | Status: DC
  Administered 2019-09-29 – 2019-10-05 (×7): 75 mg via ORAL
  Filled 2019-09-28 (×7): qty 1

## 2019-09-28 MED ORDER — ACETAMINOPHEN 325 MG PO TABS
650.0000 mg | ORAL_TABLET | Freq: Four times a day (QID) | ORAL | Status: DC | PRN
  Administered 2019-10-02 – 2019-10-05 (×5): 650 mg via ORAL
  Filled 2019-09-28 (×3): qty 2

## 2019-09-28 MED ORDER — ALBUTEROL SULFATE (2.5 MG/3ML) 0.083% IN NEBU: 2.5000 mg | INHALATION_SOLUTION | Freq: Four times a day (QID) | RESPIRATORY_TRACT | Status: DC | PRN

## 2019-09-28 NOTE — Consult Note (Signed)
Neurology Consultation Reason for Consult: Altered mental status Referring Physician: Horton, C  CC: Altered mental status  History is obtained from: Husband  HPI: [name] is a 84 y.o. female with a history of multiple recent strokes, including one a couple of weeks ago in the left frontal region.  Since recent hospitalization and early February, she has had some problems with confusion.  She was recently seen by her PCP who checked a urine culture showing extensive growth of Klebsiella Aerogenes.  She had been started on Keflex, however it turned out to be resistant to this and therefore she was changed to Cipro.  Her husband picked up the Cipro last night, but had not given it to her yet and was planning to start it this morning.  This morning, he was getting ready for work and woke her up to make her take her medicines, but she was acting more confused than she typically is.  He asked her if she knew who he was and she answered "no."  She was giving him a blank look and he became concerned that she may be having another stroke and therefore dialed 911.   LKW: Unclear tpa given?: no, unclear time of onset   ROS:  Unable to obtain due to altered mental status.   Past Medical History:  Diagnosis Date  . Allergy   . Anemia    "when I was young"  . Anginal pain ([place])   . Anxiety   . Arthritis    "all over my body"   . Chest pain 04/07/2014  . Chronic diastolic heart failure, NYHA class 1 ([place]) 01/22/2015  . Chronic lower back pain   . Chronic steroid use 12/11/2012  . COPD (chronic obstructive pulmonary disease) ([place]) 01/09/2017  . 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 Elevated lactic acid level 01/22/2015  . Essential hypertension 06/15/2018  . Frequent UTI    "recently" (06/14/2015)  . Gastric mass   . GERD (gastroesophageal reflux disease)   . Headache(784.0)    "weekly; wake up w/them " (110/19/2018)  . Heart murmur   . High cholesterol    . History of blood transfusion ~ 1954   "related to my periods"  . Migraine    "once/month maybe" (05/16/2017)  . Multiple falls 06/14/2015  . Noncompliance 12/11/2012  . OSA (obstructive sleep apnea)    "I think I'm using a BiPAP" (06/14/2015)  . Sepsis ([place]) 01/09/2017  . Syncope 08/27/2019  . Syncope and collapse 12/11/2012  . Tachycardia with heart rate 100-120 beats per minute 04/08/2014  . Type II diabetes mellitus ([place])   . Uncontrolled type 2 diabetes mellitus with complication ([facility])      Family History  Problem Relation Age of Onset  . Hypertension Mother   . Heart attack Father   . Diabetes Mellitus II Sister   . Hypertension Sister   . Cancer - Other Sister   . Migraines Niece   . Colon cancer Neg Hx      Social History:  reports that she has quit smoking. Her smoking use included cigarettes. She has never used smokeless tobacco. She reports previous alcohol use. She reports that she does not use drugs.   Exam: Current vital signs: BP 126/63 (BP Location: Right Arm)   Pulse 100   Temp 97.9 F (36.6 C) (Oral)   Resp (!) 25   SpO2 97%  Vital signs in last 24 hours: Temp:  [97.9 F (36.6 C)] 97.9 F (  36.6 C) (03/02 0559) Pulse Rate:  [100-101] 100 (03/02 0602) Resp:  [14-25] 25 (03/02 0602) BP: (126-133)/(63) 126/63 (03/02 0602) SpO2:  [97 %-98 %] 97 % (03/02 0602)   Physical Exam  Constitutional: Appears well-developed and well-nourished.  Psych: Affect appropriate to situation Eyes: No scleral injection HENT: No OP obstrucion MSK: Some chronic changes of arthritis Cardiovascular: Normal rate and regular rhythm.  Respiratory: Effort normal, non-labored breathing GI: Soft.  No distension. There is no tenderness.  Skin: WDI  Neuro: Mental Status: She has very brief one-word answers to questions, she tells me her name, but is not able to tell me the month or the year.  She initially responds to simple questions, but then appears to volitionally stop  answering. Cranial Nerves: II: Visual Fields are full. Pupils are equal, round, and reactive to light.   III,IV, VI: EOMI without ptosis or diploplia.  V: Facial sensation is symmetric to temperature VII: Facial movement is symmetric.  VIII: hearing is intact to voice X: Uvula elevates symmetrically XI: Shoulder shrug is symmetric. XII: tongue is midline without atrophy or fasciculations.  Motor: Tone is normal. Bulk is normal. 5/5 strength was present in all four extremities, though limited in her proximal right upper extremity.  This is similar to my most recent exam from last month. Sensory: Sensation is symmetric to light touch and temperature in the arms and legs. Cerebellar: She does not perform.   I have reviewed labs in epic and the results pertinent to this consultation are: Sodium 132 Potassium 3.4   I have reviewed the images obtained: CT head-negative  Impression: 84 year old female with decreased verbal output in the setting of UTI.  I suspect that this is simply altered mental status due to the UTI, however given that she has recently had a left frontal stroke, I do think it is reasonable to repeat an MRI to make sure she has not extended this.  If this is negative, then I would treat her infectious process.  Recommendations: 1) MRI brain 2) if negative, treat for UTI.   Roland Rack, MD [facility] [phone]  If 7pm- 7am, please page neurology on call as listed in [place].

## 2019-09-28 NOTE — ED Triage Notes (Signed)
Patient arrived from home via [facility] with aphasia called code stroke at 71. Had a TIA last week. EMS VS 148/76, 97.2T, 80 HR, 98% on RA, CBG 205.

## 2019-09-28 NOTE — Progress Notes (Signed)
New Admission Note:  Arrival Method: via stretcher from [facility] ED Mental Orientation: alert to self, place, disoriented to time and situation.  Pt has severe aphagia can answer yes and no questions. Telemetry: Pt on Box 11 Assessment: Completed Skin: No skin issues dry and intact IV: Left wrist with NS infusing @ 75 mL/hr Pain: no complaints of pain or discomfort Safety Measures: Safety Fall Prevention Plan was given, discussed. Admission: Completed 3W: Patient has been orientated to the room, unit and the staff. Family: Husband at bedside, states he has questions about discharge and was informed that a case manager will speak with him once plans for discharge are in place.   Orders have been reviewed and implemented. Will continue to monitor the patient. Call light has been placed within reach and bed alarm has been activated.   Janus Molder ,RN

## 2019-09-28 NOTE — ED Notes (Signed)
Date and time results received: 09/28/19  Test: lactic acid Critical Value: 2.3  Name of Provider Notified: Tamala Julian md

## 2019-09-28 NOTE — ED Provider Notes (Addendum)
Care transferred to me. Patient is awaiting MRI. While down here she remains altered. HR 110s. No fever. While there is no WBC elevation, I am concerned she is altered from UTI. Will send lactate, give fluids. Already given IV cipro. Bc of altered mental status, tachycardia, UTI, will need admission. Updated husband. Dr. Tamala Julian to admit.   Sherwood Gambler, MD 09/28/19 1212

## 2019-09-28 NOTE — Plan of Care (Signed)

## 2019-09-28 NOTE — H&P (Addendum)
History and Physical    [name] [id number] DOB: Mar 30, 1936 DOA: 09/28/2019  Referring MD/NP/PA: Sherwood Gambler, MD PCP: [name], PA-C  Consultants: Havery Moros - GILamonte Sakai - pulmonology Patient coming from: Home via EMS  Chief Complaint: Altered mental status  I have personally briefly reviewed patient's old medical records in [place]   HPI: [name] is a 84 y.o. 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  She presents after being found to be acutely altered. Husband had called EMS this morning because she was disoriented and was incontinent of urine.   Patient had been being treated for a UTI as an outpatient on cephalexin, but review of records shows cultures from 2/24 were noted to be resistant.  The patient's primary care provider had called in a prescription for ciprofloxacin yesterday, but family had not been able to pick it up yet.  Since the patient had been discharged home from the hospital on 2/21, family reports that she had not been eating or drinking much.  Patient denies having any complaints of pain or shortness of breath.  ED Course: Patient presented to the ED as a code stroke.  CT scan of the brain did not show any acute abnormalities.  Neurology was consulted.  Patient was noted to be afebrile, heart rate 98-117, respirations 14-20, and all other vital signs maintained.  Labs significant for WBC 5.6, hemoglobin 11.4, sodium 132, glucose 188, and AST 43.  Urinalysis was positive for large leukocytes, many bacteria, and 21-50 WBCs.  UDS was negative.  Urine cultures were sent.  Neurology recommended checking MRI of the brain.  TR[facility] called to admit.  Review of Systems  Unable to perform ROS: Mental status change  Respiratory: Negative for shortness of breath.     Cardiovascular: Negative for chest pain and leg swelling.  Gastrointestinal: Negative for abdominal pain and vomiting.    Past Medical History:  Diagnosis Date  . Allergy   . Anemia    "when I was young"  . Anginal pain ([place])   . Anxiety   . Arthritis    "all over my body"   . Chest pain 04/07/2014  . Chronic diastolic heart failure, NYHA class 1 ([place]) 01/22/2015  . Chronic lower back pain   . Chronic steroid use 12/11/2012  . COPD (chronic obstructive pulmonary disease) ([place]) 01/09/2017  . 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 Elevated lactic acid level 01/22/2015  . Essential hypertension 06/15/2018  . Frequent UTI    "recently" (06/14/2015)  . Gastric mass   . GERD (gastroesophageal reflux disease)   . Headache(784.0)    "weekly; wake up w/them " (110/19/2018)  . Heart murmur   . High cholesterol   . History of blood transfusion ~ 1954   "related to my periods"  . Migraine    "once/month maybe" (05/16/2017)  . Multiple falls 06/14/2015  . Noncompliance 12/11/2012  . OSA (obstructive sleep apnea)    "I think I'm using a BiPAP" (06/14/2015)  . Sepsis ([place]) 01/09/2017  . Syncope 08/27/2019  . Syncope and collapse 12/11/2012  . Tachycardia with heart rate 100-120 beats per minute 04/08/2014  . Type II diabetes mellitus ([place])   . Uncontrolled type 2 diabetes mellitus with complication [facility])     Past Surgical History:  Procedure Laterality Date  . APPENDECTOMY  1970   Archie Endo  12/12/2010  . BACK SURGERY    . BIOPSY  05/25/2019   Procedure: BIOPSY;  Surgeon: Rush Landmark Telford Nab., MD;  Location: [place];  Service: Gastroenterology;;  . [place]   Archie Endo 12/12/2010  . ESOPHAGOGASTRODUODENOSCOPY N/A 05/25/2019   Procedure: ESOPHAGOGASTRODUODENOSCOPY (EGD);  Surgeon: Irving Copas., MD;  Location: [place];  Service: Gastroenterology;  Laterality: N/A;  . EUS N/A 10/16/2017   Procedure: UPPER ENDOSCOPIC  ULTRASOUND (EUS) RADIAL;  Surgeon: Milus Banister, MD;  Location: [facility];  Service: Endoscopy;  Laterality: N/A;  . FRACTURE SURGERY    . LAPAROSCOPIC CHOLECYSTECTOMY  1990's  . LUMBAR MICRODISCECTOMY Left 09/2006   L5-S1/notes 12/12/2010  . MULTIPLE EXTRACTIONS WITH ALVEOLOPLASTY Bilateral 06/13/2017   Procedure: MULTIPLE EXTRACTION WITH ALVEOLOPLASTY;  Surgeon: Diona Browner, DDS;  Location: [place];  Service: Oral Surgery;  Laterality: Bilateral;  . MULTIPLE TOOTH EXTRACTIONS    . ORIF SHOULDER FRACTURE Left ~ 2009   "put cement down in it"   . TONSILLECTOMY  1950's?  . TOTAL ABDOMINAL HYSTERECTOMY  1979   Archie Endo 12/12/2010     reports that she has quit smoking. Her smoking use included cigarettes. She has never used smokeless tobacco. She reports previous alcohol use. She reports that she does not use drugs.  Allergies  Allergen Reactions  . Lactose Intolerance ([place])     Family History  Problem Relation Age of Onset  . Hypertension Mother   . Heart attack Father   . Diabetes Mellitus II Sister   . Hypertension Sister   . Cancer - Other Sister   . Migraines Niece   . Colon cancer Neg Hx     Prior to Admission medications   Medication Sig Start Date End Date Taking? Authorizing Provider  albuterol (ACCUNEB) 0.63 MG/3ML nebulizer solution Take 2 ampules by nebulization every 6 (six) hours as needed for wheezing.    [provider]  aspirin EC 81 MG tablet Take 1 tablet (81 mg total) by mouth every Monday, Wednesday, and Friday. 08/04/19   Geradine Girt, DO  atorvastatin (LIPITOR) 20 MG tablet Take 1 tablet (20 mg total) by mouth daily. Take 1 Tablet by mouth daily. 09/19/19   Swayze, Ava, DO  cephALEXin (KEFLEX) 500 MG capsule Take 1 capsule (500 mg total) by mouth 2 (two) times daily for 7 days. 09/22/19 09/29/19  Rodriguez-Southworth, Sunday Spillers, PA-C  ciprofloxacin (CIPRO) 250 MG tablet Take 1 tablet (250 mg total) by mouth 2 (two) times daily. 09/27/19    Rodriguez-Southworth, Sunday Spillers, PA-C  clopidogrel (PLAVIX) 75 MG tablet Take 1 tablet (75 mg total) by mouth daily. 09/20/19   Swayze, Ava, DO  dicyclomine (BENTYL) 10 MG capsule Take 1 capsule (10 mg total) by mouth every 8 (eight) hours as needed for spasms. 08/23/19   Armbruster, Carlota Raspberry, MD  docusate sodium (COLACE) 100 MG capsule Take 1 capsule (100 mg total) by mouth 2 (two) times daily. 08/03/19   Geradine Girt, DO  feeding supplement, ENSURE ENLIVE, (ENSURE ENLIVE) LIQD Take 237 mLs by mouth 3 (three) times daily between meals. 09/19/19   Swayze, Ava, DO  folic acid (FOLVITE) 1 MG tablet Take 1 tablet (1 mg total) by mouth daily. 09/22/19 09/21/20  Rodriguez-Southworth, Sunday Spillers, PA-C  glucose blood (ONETOUCH VERIO) test strip Use to check blood sugar 3 to 4 times a day before meals 03/25/19   Rodriguez-Southworth, Sunday Spillers, PA-C  hydrOXYzine (ATARAX/VISTARIL) 10 MG tablet 1 q 8h prn itching Patient taking differently: Take 10  mg by mouth every 8 (eight) hours as needed for itching.  04/22/19   Rodriguez-Southworth, Sunday Spillers, PA-C  metoCLOPramide (REGLAN) 5 MG tablet Take 1 tablet (5 mg total) by mouth 3 (three) times daily before meals. 08/23/19   Armbruster, Carlota Raspberry, MD  metoprolol tartrate (LOPRESSOR) 25 MG tablet Take 1 tablet (25 mg total) by mouth 2 (two) times daily. 09/19/19 09/18/20  Swayze, Ava, DO  mometasone-formoterol (DULERA) 200-5 MCG/ACT AERO Inhale 2 puffs into the lungs 2 (two) times daily.    [provider]  Multiple Vitamin (MULTIVITAMIN WITH MINERALS) TABS tablet Take 1 tablet by mouth daily. 09/20/19   Swayze, Ava, DO  ondansetron (ZOFRAN ODT) 4 MG disintegrating tablet Take 1 tablet (4 mg total) by mouth every 8 (eight) hours as needed for up to 30 doses for nausea or vomiting. 09/02/19   Antonieta Pert, MD  simethicone (GAS-X EXTRA STRENGTH) 125 MG chewable tablet Chew 125 mg by mouth every 6 (six) hours as needed for flatulence.    [provider]  vitamin B-12 1000 MCG  tablet Take 1 tablet (1,000 mcg total) by mouth daily. 09/20/19   Swayze, Ava, DO    Physical Exam:  Constitutional: Cachectic elderly female who appears to be in no acute distress Vitals:   09/28/19 0945 09/28/19 1000 09/28/19 1041 09/28/19 1045  BP: (!) 142/99 (!) 142/83  134/61  Pulse: (!) 114 (!) 117  (!) 112  Resp: (!) 24 (!) 28  (!) 24  Temp:   98.1 F (36.7 C)   TempSrc:   Rectal   SpO2: 98% 97%  99%   Eyes: PERRL, lids and conjunctivae normal ENMT: Mucous membranes are dry. Posterior pharynx clear of any exudate or lesions.   Neck: normal, supple, no masses, no thyromegaly Respiratory: Mildly tachypneic but no significant wheezes or rhonchi appreciated..  Cardiovascular: Tachycardic, no murmurs / rubs / gallops. No extremity edema. 2+ pedal pulses. No carotid bruits.  Abdomen: no tenderness, no masses palpated. No hepatosplenomegaly. Bowel sounds positive.  Musculoskeletal: no clubbing / cyanosis. No joint deformity upper and lower extremities. Good ROM, no contractures. Normal muscle tone.  Skin: no rashes, lesions, ulcers. No induration Neurologic: CN 2-12 grossly intact. Sensation intact, DTR normal. Strength 5/5 in all 4.  Psychiatric: Alert and oriented x person and place. Normal mood.     Labs on Admission: I have personally reviewed following labs and imaging studies  CBC: Recent Labs  Lab 09/28/19 0611 09/28/19 0615  WBC 5.6  --   NEUTROABS 2.5  --   HGB 11.4* 12.2  HCT 36.6 36.0  MCV 93.4  --   PLT 263  --    Basic Metabolic Panel: Recent Labs  Lab 09/22/19 1750 09/28/19 0611 09/28/19 0615  NA 139 132* 132*  K 4.0 3.6 3.4*  CL 102 99 97*  CO2 25 20*  --   GLUCOSE 188* 188* 185*  BUN 10 7* 7*  CREATININE 0.62 0.78 0.60  CALCIUM 9.2 9.0  --   MG 1.6  --   --    GFR: Estimated Creatinine Clearance: 41.3 mL/min (by C-G formula based on SCr of 0.6 mg/dL). Liver Function Tests: Recent Labs  Lab 09/22/19 1750 09/28/19 0611  AST 48* 43*  ALT  22 23  ALKPHOS 62 53  BILITOT 0.2 0.6  PROT 6.8 6.2*  ALBUMIN 3.5* 3.0*   No results for input(s): LIPASE, AMYLASE in the last 168 hours. No results for input(s): AMMONIA in the last 168 hours.  Coagulation Profile: Recent Labs  Lab 09/28/19 0611  INR 1.0   Cardiac Enzymes: No results for input(s): CKTOTAL, CKMB, CKMBINDEX, TROPONINI in the last 168 hours. BNP (last 3 results) No results for input(s): PROBNP in the last 8760 hours. HbA1C: No results for input(s): HGBA1C in the last 72 hours. CBG: Recent Labs  Lab 09/28/19 0548  GLUCAP 145*   Lipid Profile: No results for input(s): CHOL, HDL, LDLCALC, TRIG, CHOLHDL, LDLDIRECT in the last 72 hours. Thyroid Function Tests: No results for input(s): TSH, T4TOTAL, FREET4, T3FREE, THYROIDAB in the last 72 hours. Anemia Panel: No results for input(s): VITAMINB12, FOLATE, FERRITIN, TIBC, IRON, RETICCTPCT in the last 72 hours. Urine analysis:    Component Value Date/Time   COLORURINE YELLOW 09/28/2019 0719   APPEARANCEUR HAZY (A) 09/28/2019 0719   LABSPEC 1.004 (L) 09/28/2019 0719   PHURINE 6.0 09/28/2019 0719   GLUCOSEU NEGATIVE 09/28/2019 0719   HGBUR NEGATIVE 09/28/2019 0719   BILIRUBINUR NEGATIVE 09/28/2019 0719   BILIRUBINUR negative 09/22/2019 1541   KETONESUR NEGATIVE 09/28/2019 0719   PROTEINUR NEGATIVE 09/28/2019 0719   UROBILINOGEN 0.2 09/22/2019 1541   UROBILINOGEN 2.0 (H) 08/13/2019 1542   NITRITE NEGATIVE 09/28/2019 0719   LEUKOCYTESUR LARGE (A) 09/28/2019 0719   Sepsis Labs: Recent Results (from the past 240 hour(s))  Culture, Urine     Status: Abnormal   Collection Time: 09/22/19  4:20 PM   Specimen: Urine   URINE  Result Value Ref Range Status   Urine Culture, Routine Final report (A)  Final   Organism ID, Bacteria Klebsiella aerogenes (A)  Final    Comment: Greater than 100,000 colony forming units per mL formerly Enterobacter aerogenes    Antimicrobial Susceptibility Comment  Final    Comment:        ** S = Susceptible; I = Intermediate; R = Resistant **                    P = Positive; N = Negative             MICS are expressed in micrograms per mL    Antibiotic                 RSLT#1    RSLT#2    RSLT#3    RSLT#4 Amoxicillin/Clavulanic Acid    R Cefazolin                      R Cefepime                       S Ceftriaxone                    R Cefuroxime                     R Ciprofloxacin                  S Gentamicin                     S Imipenem                       S Levofloxacin                   S Meropenem                      S Nitrofurantoin  R Tetracycline                   S Tobramycin                     S Trimethoprim/Sulfa             S      Radiological Exams on Admission: CT HEAD CODE STROKE WO CONTRAST  Result Date: 09/28/2019 CLINICAL DATA:  Code stroke. Facial droop. Aphasia. Last seen normal 22:00 Last evening. Recent infarct. EXAM: CT HEAD WITHOUT CONTRAST TECHNIQUE: Contiguous axial images were obtained from the base of the skull through the vertex without intravenous contrast. COMPARISON:  MR head without contrast 09/16/2019. CT head and neck and CT perfusion 09/16/2019 FINDINGS: Brain: Mild atrophy and moderate white matter disease is stable. Basal ganglia are intact. Insular ribbon is normal. Acute or focal cortical abnormality is present. The ventricles are proportionate to the degree of atrophy. No significant extraaxial fluid collection is present. The brainstem and cerebellum are within normal limits. Vascular: Atherosclerotic calcifications are present within the cavernous internal carotid arteries bilaterally and at the dural margin of the right vertebral artery. No hyperdense vessel is present. Skull: Insert normal skull No significant extracranial soft tissue lesion is present. Sinuses/Orbits: The paranasal sinuses and mastoid air cells are clear. A remote left orbital blowout fracture is present. Globes and orbits are otherwise within  normal limits. ASPECTS [facility] Stroke Program Early CT Score) - Ganglionic level infarction (caudate, lentiform nuclei, internal capsule, insula, M1-M3 cortex): 7/7 - Supraganglionic infarction (M4-M6 cortex): 3/3 Total score (0-10 with 10 being normal): 10/10 IMPRESSION: 1. No acute intracranial abnormality or significant interval change. 2. Stable atrophy and white matter disease. This likely reflects the sequela of chronic microvascular ischemia. 3. ASPECTS is 10/10 The above was relayed via text pager to Dr. Roland Rack on 09/28/2019 at 05:59 . Electronically Signed   By: San Morelle M.D.   On: 09/28/2019 06:00    EKG: Independently reviewed.  Sinus tachycardia 103 bpm  Assessment/Plan Acute encephalopathy: Patient noted to be acutely altered and had urinated on herself this morning.  Suspect symptoms secondary to untreated urinary tract infection. -Admit to a medical telemetry bed -Neurochecks -Follow-up MRI of the brain  Urinary tract infection: Acute.  Patient with recent cultures from 2/24 for Klebsiella aerogenes which was resistant to cephalosporins.  Her PCP had tried to start her on ciprofloxacin, but family was not able to get the medication in time prior to her becoming altered.  Patient was started on ciprofloxacin IV. -Follow-up repeat urine cultures -Continue ciprofloxacin IV  Essential hypertension and tachycardia: Blood pressures noted to be elevated up to 163/84 with pulse elevated to 117.  Home blood pressure medications include metoprolol 25 mg twice daily. -Continue metoprolol  Hyponatremia: Acute.  Sodium 132 on admission.  Suspect possibility of a hypovolemic hyponatremia with patient's reports of decreased p.o. intake. -Check urine sodium, urine osmolarity, and serum osmolarity -Gentle IV fluids at 75 mL/h overnight  Failure to thrive, underweight: Patient BMI is 17.37 kg/m.  At home patient reported not to be eating or drinking much.  Suspect symptoms  could be secondary to urinary tract infection. -Assist patient with meals -Glucerna shakes  Recent CVA: Patient suffered a stroke at the left posterior frontal lobe on 2/18. -Continue aspirin and Plavix -Continue outpatient follow-up with neurology  Normocytic anemia: Chronic.  Hemoglobin 11.4 which appears near patient's baseline -Continue to monitor   Diabetes mellitus  type 2: On admission glucose elevated up to 188.  Hemoglobin A1c noted to be 6.2 on 2/19.  Patient not on any diabetic medications at home.  Elevated blood glucoses likely related with infection -Hypoglycemic protocol -Continue Reglan -CBGs before every meal with very sensitive SSI -Discontinue whenever medically appropriate  Hyperlipidemia: Lipid panel from 2/19 noted total cholesterol 106, HDL 34, LDL 34, triglycerides 190.  Home medications include atorvastatin 20 mg daily -Continue atorvastatin  Elevated AST: Acute on chronic.  AST elevated at 43 which appears similar to previous checks when reviewing records.  Unclear cause.     DVT prophylaxis: Lovenox Code Status: DNR Family Communication: Patient's niece updated over the phone as has been unavailable Disposition Plan: Possible discharge home in 2 to 3 days Consults called: Neurology Admission status: Inpatient   Norval Morton MD [facility] Pager [phone]   If 7PM-7AM, please contact night-coverage [email or web address] Password [facility]  09/28/2019, 11:31 AM

## 2019-09-28 NOTE — ED Notes (Signed)
Pt transported to MRI 

## 2019-09-28 NOTE — ED Notes (Signed)
PAGED ADMITTING PER RN  

## 2019-09-28 NOTE — ED Provider Notes (Signed)
[facility] EMERGENCY DEPARTMENT Provider Note   CSN: [id number] Arrival date & time: 09/28/19  [id number]  An emergency department physician performed an initial assessment on this suspected stroke patient at 0547.  History Chief Complaint  Patient presents with  . Code Stroke    [name] is a 84 y.o. female.  HPI     This is an 84 year old female who presents with possible stroke.  Patient with history of hypertension, reflux, hypercholesterolemia, diastolic heart failure with recent admission for syncope and possible CVA who presents as a code stroke by EMS.  Per EMS, last seen normal by husband at 8 PM.  When he woke up this morning, he found his wife on the couch.  She would not say anything to him and he was concerned she was having a stroke.  Per EMS, she seems to understand and follow commands but does not speaking.  My evaluation of the bridge she answers short of yes or no questions.  She is disoriented.  No other obvious neurologic deficits.  Neurology at the bedside.  Of note, chart reviewed.  Patient recently evaluated by primary physician and found to have UTI.  Sensitivities resulted and she was supposed to have antibiotics changed to Cipro.  Per discussion between neurology and the husband, she has not started Cipro yet.  Past Medical History:  Diagnosis Date  . Allergy   . Anemia    "when I was young"  . Anginal pain ([place])   . Anxiety   . Arthritis    "all over my body"   . Chest pain 04/07/2014  . Chronic diastolic heart failure, NYHA class 1 ([place]) 01/22/2015  . Chronic lower back pain   . Chronic steroid use 12/11/2012  . COPD (chronic obstructive pulmonary disease) ([place]) 01/09/2017  . 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 Elevated lactic acid level 01/22/2015  . Essential hypertension 06/15/2018  . Frequent UTI    "recently" (06/14/2015)  . Gastric mass   . GERD (gastroesophageal reflux disease)   .  Headache(784.0)    "weekly; wake up w/them " (110/19/2018)  . Heart murmur   . High cholesterol   . History of blood transfusion ~ 1954   "related to my periods"  . Migraine    "once/month maybe" (05/16/2017)  . Multiple falls 06/14/2015  . Noncompliance 12/11/2012  . OSA (obstructive sleep apnea)    "I think I'm using a BiPAP" (06/14/2015)  . Sepsis ([place]) 01/09/2017  . Syncope 08/27/2019  . Syncope and collapse 12/11/2012  . Tachycardia with heart rate 100-120 beats per minute 04/08/2014  . Type II diabetes mellitus ([place])   . Uncontrolled type 2 diabetes mellitus with complication [facility])     Patient Active Problem List   Diagnosis Date Noted  . Protein-calorie malnutrition, severe 09/17/2019  . Acute ischemic stroke ([place]) 09/16/2019  . DNR (do not resuscitate) 08/02/2019  . Dehydration 08/01/2019  . Urinary tract infection symptoms 07/31/2019  . Diabetic gastroparesis ([place]) 07/31/2019  . Unexplained weight loss 07/12/2019  . Non-intractable vomiting   . Nausea, vomiting, and diarrhea 05/30/2019  . Hypokalemia 05/30/2019  . Hypomagnesemia 05/30/2019  . Gastritis 05/30/2019  . Elevated lipase 05/30/2019  . SIRS (systemic inflammatory response syndrome) ([place]) 05/30/2019  . History of COPD   . Other chronic pain 04/22/2019  . Dyspnea 08/10/2018  . Acute diastolic heart failure ([place]) 06/15/2018  . Essential hypertension 06/15/2018  . 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 Gastric mass   .  Sepsis secondary to UTI ([place])   . Uncontrolled type 2 diabetes mellitus with complication ([place])   . AKI (acute kidney injury) ([place])   . Chronic diastolic CHF (congestive heart failure) ([place])   . E-coli UTI 01/09/2017  . Sepsis ([place]) 01/09/2017  . Constipation 01/09/2017  . COPD (chronic obstructive pulmonary disease) ([place]) 01/09/2017  . Multiple falls 06/14/2015  . Weight loss 06/14/2015  . Acute respiratory failure with hypoxia ([place]) 06/14/2015  . Orthostasis 06/14/2015  . Acute respiratory  failure ([place]) 06/14/2015  . Elevated lactic acid level 01/22/2015  . 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 Chronic diastolic heart failure, NYHA class 1 ([place]) 01/22/2015  . Tachycardia with heart rate 100-120 beats per minute 04/08/2014  . DM (diabetes mellitus) ([place]) 04/08/2014  . Chest pain 04/07/2014  . Syncope 04/07/2014  . Hyperglycemia 12/11/2012  . Syncope and collapse 12/11/2012  . Dehydration, mild 12/11/2012  . Noncompliance 12/11/2012  . UTI (lower urinary tract infection) 12/11/2012  . 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 Rheumatoid arthritis ([place]) 12/11/2012    Past Surgical History:  Procedure Laterality Date  . APPENDECTOMY  1970   Archie Endo 12/12/2010  . BACK SURGERY    . BIOPSY  05/25/2019   Procedure: BIOPSY;  Surgeon: Rush Landmark Telford Nab., MD;  Location: [facility];  Service: Gastroenterology;;  . [place]   Archie Endo 12/12/2010  . ESOPHAGOGASTRODUODENOSCOPY N/A 05/25/2019   Procedure: ESOPHAGOGASTRODUODENOSCOPY (EGD);  Surgeon: Irving Copas., MD;  Location: [facility];  Service: Gastroenterology;  Laterality: N/A;  . EUS N/A 10/16/2017   Procedure: UPPER ENDOSCOPIC ULTRASOUND (EUS) RADIAL;  Surgeon: Milus Banister, MD;  Location: [facility];  Service: Endoscopy;  Laterality: N/A;  . FRACTURE SURGERY    . LAPAROSCOPIC CHOLECYSTECTOMY  1990's  . LUMBAR MICRODISCECTOMY Left 09/2006   L5-S1/notes 12/12/2010  . MULTIPLE EXTRACTIONS WITH ALVEOLOPLASTY Bilateral 06/13/2017   Procedure: MULTIPLE EXTRACTION WITH ALVEOLOPLASTY;  Surgeon: Diona Browner, DDS;  Location: [place];  Service: Oral Surgery;  Laterality: Bilateral;  . MULTIPLE TOOTH EXTRACTIONS    . ORIF SHOULDER FRACTURE Left ~ 2009   "put cement down in it"   . TONSILLECTOMY  1950's?  . TOTAL ABDOMINAL HYSTERECTOMY  1979   Archie Endo 12/12/2010     OB History   No obstetric history on file.     Family History  Problem Relation Age of Onset  .  Hypertension Mother   . Heart attack Father   . Diabetes Mellitus II Sister   . Hypertension Sister   . Cancer - Other Sister   . Migraines Niece   . Colon cancer Neg Hx     Social History   Tobacco Use  . Smoking status: Former Smoker    Types: Cigarettes  . Smokeless tobacco: Never Used  . Tobacco comment: 05/16/2017"1 pack of cigarettes would last 3-4 months; haven't had any cigarettes for 40 years"  Substance Use Topics  . Alcohol use: Not Currently    Comment: 05/16/2017 "used to drink beer in the 1990's; don't drink at all now"  . Drug use: No    Home Medications Prior to Admission medications   Medication Sig Start Date End Date Taking? Authorizing Provider  albuterol (ACCUNEB) 0.63 MG/3ML nebulizer solution Take 2 ampules by nebulization every 6 (six) hours as needed for wheezing.    [provider]  aspirin EC 81 MG tablet Take 1 tablet (81 mg total) by mouth every Monday, Wednesday, and Friday. 08/04/19   Eliseo Squires,  Jessica U, DO  atorvastatin (LIPITOR) 20 MG tablet Take 1 tablet (20 mg total) by mouth daily. Take 1 Tablet by mouth daily. 09/19/19   Swayze, Ava, DO  cephALEXin (KEFLEX) 500 MG capsule Take 1 capsule (500 mg total) by mouth 2 (two) times daily for 7 days. 09/22/19 09/29/19  Rodriguez-Southworth, Sunday Spillers, PA-C  ciprofloxacin (CIPRO) 250 MG tablet Take 1 tablet (250 mg total) by mouth 2 (two) times daily. 09/27/19   Rodriguez-Southworth, Sunday Spillers, PA-C  clopidogrel (PLAVIX) 75 MG tablet Take 1 tablet (75 mg total) by mouth daily. 09/20/19   Swayze, Ava, DO  dicyclomine (BENTYL) 10 MG capsule Take 1 capsule (10 mg total) by mouth every 8 (eight) hours as needed for spasms. 08/23/19   Armbruster, Carlota Raspberry, MD  docusate sodium (COLACE) 100 MG capsule Take 1 capsule (100 mg total) by mouth 2 (two) times daily. 08/03/19   Geradine Girt, DO  feeding supplement, ENSURE ENLIVE, (ENSURE ENLIVE) LIQD Take 237 mLs by mouth 3 (three) times daily between meals. 09/19/19   Swayze,  Ava, DO  folic acid (FOLVITE) 1 MG tablet Take 1 tablet (1 mg total) by mouth daily. 09/22/19 09/21/20  Rodriguez-Southworth, Sunday Spillers, PA-C  glucose blood (ONETOUCH VERIO) test strip Use to check blood sugar 3 to 4 times a day before meals 03/25/19   Rodriguez-Southworth, Sunday Spillers, PA-C  hydrOXYzine (ATARAX/VISTARIL) 10 MG tablet 1 q 8h prn itching Patient taking differently: Take 10 mg by mouth every 8 (eight) hours as needed for itching.  04/22/19   Rodriguez-Southworth, Sunday Spillers, PA-C  metoCLOPramide (REGLAN) 5 MG tablet Take 1 tablet (5 mg total) by mouth 3 (three) times daily before meals. 08/23/19   Armbruster, Carlota Raspberry, MD  metoprolol tartrate (LOPRESSOR) 25 MG tablet Take 1 tablet (25 mg total) by mouth 2 (two) times daily. 09/19/19 09/18/20  Swayze, Ava, DO  mometasone-formoterol (DULERA) 200-5 MCG/ACT AERO Inhale 2 puffs into the lungs 2 (two) times daily.    [provider]  Multiple Vitamin (MULTIVITAMIN WITH MINERALS) TABS tablet Take 1 tablet by mouth daily. 09/20/19   Swayze, Ava, DO  ondansetron (ZOFRAN ODT) 4 MG disintegrating tablet Take 1 tablet (4 mg total) by mouth every 8 (eight) hours as needed for up to 30 doses for nausea or vomiting. 09/02/19   Antonieta Pert, MD  simethicone (GAS-X EXTRA STRENGTH) 125 MG chewable tablet Chew 125 mg by mouth every 6 (six) hours as needed for flatulence.    [provider]  vitamin B-12 1000 MCG tablet Take 1 tablet (1,000 mcg total) by mouth daily. 09/20/19   Swayze, Ava, DO    Allergies    Lactose intolerance (gi)  Review of Systems   Review of Systems  Unable to perform ROS: Acuity of condition    Physical Exam Updated Vital Signs BP 126/63 (BP Location: Right Arm)   Pulse 100   Temp 97.9 F (36.6 C) (Oral)   Resp (!) 25   SpO2 97%   Physical Exam Vitals and nursing note reviewed.  Constitutional:      Appearance: She is well-developed.     Comments: Chronically ill-appearing female, no acute distress, ABCs intact  HENT:       Head: Normocephalic and atraumatic.     Mouth/Throat:     Mouth: Mucous membranes are dry.  Eyes:     Extraocular Movements: Extraocular movements intact.     Pupils: Pupils are equal, round, and reactive to light.  Cardiovascular:     Rate and Rhythm: Normal  rate and regular rhythm.     Heart sounds: Normal heart sounds.  Pulmonary:     Effort: Pulmonary effort is normal. No respiratory distress.     Breath sounds: No wheezing.  Abdominal:     General: Bowel sounds are normal.     Palpations: Abdomen is soft.     Tenderness: There is no abdominal tenderness.  Musculoskeletal:     Cervical back: Neck supple.     Right lower leg: No edema.     Left lower leg: No edema.  Skin:    General: Skin is warm and dry.  Neurological:     Mental Status: She is alert and oriented to person, place, and time.     Comments: Oriented to self, follows commands, no facial droop noted, drift right upper extremity, further neurologic exam and NIH deferred to neurology and stroke team  Psychiatric:     Comments: Flat affect     ED Results / Procedures / Treatments   Labs (all labs ordered are listed, but only abnormal results are displayed) Labs Reviewed  CBC - Abnormal; Notable for the following components:      Result Value   Hemoglobin 11.4 (*)    All other components within normal limits  COMPREHENSIVE METABOLIC PANEL - Abnormal; Notable for the following components:   Sodium 132 (*)    CO2 20 (*)    Glucose, Bld 188 (*)    BUN 7 (*)    Total Protein 6.2 (*)    Albumin 3.0 (*)    AST 43 (*)    All other components within normal limits  I-STAT CHEM 8, ED - Abnormal; Notable for the following components:   Sodium 132 (*)    Potassium 3.4 (*)    Chloride 97 (*)    BUN 7 (*)    Glucose, Bld 185 (*)    Calcium, Ion 1.11 (*)    All other components within normal limits  CBG MONITORING, ED - Abnormal; Notable for the following components:   Glucose-Capillary 145 (*)    All other  components within normal limits  URINE CULTURE  ETHANOL  PROTIME-INR  APTT  DIFFERENTIAL  RAPID URINE DRUG SCREEN, HOSP PERFORMED  URINALYSIS, ROUTINE W REFLEX MICROSCOPIC    EKG EKG Interpretation  Date/Time:  Tuesday September 28 2019 06:09:06 EST Ventricular Rate:  103 PR Interval:    QRS Duration: 77 QT Interval:  358 QTC Calculation: 469 R Axis:   -39 Text Interpretation: Sinus tachycardia Left axis deviation Confirmed by Thayer Jew [phone]) on 09/28/2019 6:14:29 AM   Radiology CT HEAD CODE STROKE WO CONTRAST  Result Date: 09/28/2019 CLINICAL DATA:  Code stroke. Facial droop. Aphasia. Last seen normal 22:00 Last evening. Recent infarct. EXAM: CT HEAD WITHOUT CONTRAST TECHNIQUE: Contiguous axial images were obtained from the base of the skull through the vertex without intravenous contrast. COMPARISON:  MR head without contrast 09/16/2019. CT head and neck and CT perfusion 09/16/2019 FINDINGS: Brain: Mild atrophy and moderate white matter disease is stable. Basal ganglia are intact. Insular ribbon is normal. Acute or focal cortical abnormality is present. The ventricles are proportionate to the degree of atrophy. No significant extraaxial fluid collection is present. The brainstem and cerebellum are within normal limits. Vascular: Atherosclerotic calcifications are present within the cavernous internal carotid arteries bilaterally and at the dural margin of the right vertebral artery. No hyperdense vessel is present. Skull: Insert normal skull No significant extracranial soft tissue lesion is present. Sinuses/Orbits: The paranasal  sinuses and mastoid air cells are clear. A remote left orbital blowout fracture is present. Globes and orbits are otherwise within normal limits. ASPECTS [facility] Stroke Program Early CT Score) - Ganglionic level infarction (caudate, lentiform nuclei, internal capsule, insula, M1-M3 cortex): 7/7 - Supraganglionic infarction (M4-M6 cortex): 3/3 Total score (0-10  with 10 being normal): 10/10 IMPRESSION: 1. No acute intracranial abnormality or significant interval change. 2. Stable atrophy and white matter disease. This likely reflects the sequela of chronic microvascular ischemia. 3. ASPECTS is 10/10 The above was relayed via text pager to Dr. Roland Rack on 09/28/2019 at 05:59 . Electronically Signed   By: San Morelle M.D.   On: 09/28/2019 06:00    Procedures Procedures (including critical care time)  Medications Ordered in ED Medications - No data to display  ED Course  I have reviewed the triage vital signs and the nursing notes.  Pertinent labs & imaging results that were available during my care of the patient were reviewed by me and considered in my medical decision making (see chart for details).    MDM Rules/Calculators/A&P                       Patient presents following activation of code stroke.  Concern for aphasia.  Recent admission for stroke work-up.  Also recently found to have a UTI by her primary physician.  Has not started her new antibiotics.  On my evaluation she does not appear aphasic and can symptoms follow commands.  Suspect there may be some component of generalized altered mental status secondary to UTI.  Initial CT scan is negative for acute bleed.  Lab work-up is largely reassuring.  Will repeat urinalysis.  If MRI is negative, from neurology can treat appropriately for UTI.  If patient is unable to ambulate safely, she may need admission.  Final Clinical Impression(s) / ED Diagnoses Final diagnoses:  None    Rx / DC Orders ED Discharge Orders    None       Valeen Borys, Barbette Hair, MD 09/28/19 0730

## 2019-09-29 LAB — GLUCOSE, CAPILLARY
Glucose-Capillary: 99 mg/dL (ref 70–99)
Glucose-Capillary: 145 mg/dL — ABNORMAL HIGH (ref 70–99)
Glucose-Capillary: 116 mg/dL — ABNORMAL HIGH (ref 70–99)
Glucose-Capillary: 87 mg/dL (ref 70–99)

## 2019-09-29 LAB — COMPREHENSIVE METABOLIC PANEL
ALT: 24 U/L (ref 0–44)
AST: 49 U/L — ABNORMAL HIGH (ref 15–41)
Albumin: 2.8 g/dL — ABNORMAL LOW (ref 3.5–5.0)
Alkaline Phosphatase: 48 U/L (ref 38–126)
Anion gap: 9 (ref 5–15)
BUN: <5 mg/dL — ABNORMAL LOW (ref 8–23)
CO2: 25 mmol/L (ref 22–32)
Calcium: 8.4 mg/dL — ABNORMAL LOW (ref 8.9–10.3)
Chloride: 104 mmol/L (ref 98–111)
Creatinine, Ser: 0.63 mg/dL (ref 0.44–1.00)
GFR calc Af Amer: >60 mL/min (ref >60)
GFR calc non Af Amer: >60 mL/min (ref >60)
Glucose, Bld: 120 mg/dL — ABNORMAL HIGH (ref 70–99)
Potassium: 3.1 mmol/L — ABNORMAL LOW (ref 3.5–5.1)
Sodium: 138 mmol/L (ref 135–145)
Total Bilirubin: 0.3 mg/dL (ref 0.3–1.2)
Total Protein: 5.8 g/dL — ABNORMAL LOW (ref 6.5–8.1)

## 2019-09-29 LAB — CBC
HCT: 35.1 % — ABNORMAL LOW (ref 36.0–46.0)
Hemoglobin: 11.1 g/dL — ABNORMAL LOW (ref 12.0–15.0)
MCH: 29.1 pg (ref 26.0–34.0)
MCHC: 31.6 g/dL (ref 30.0–36.0)
MCV: 91.9 fL (ref 80.0–100.0)
Platelets: 279 10*3/uL (ref 150–400)
RBC: 3.82 MIL/uL — ABNORMAL LOW (ref 3.87–5.11)
RDW: 14.9 % (ref 11.5–15.5)
WBC: 4.5 10*3/uL (ref 4.0–10.5)
nRBC: 0 % (ref 0.0–0.2)

## 2019-09-29 LAB — MAGNESIUM: Magnesium: 1.7 mg/dL (ref 1.7–2.4)

## 2019-09-29 LAB — PREALBUMIN: Prealbumin: 20.8 mg/dL (ref 18–38)

## 2019-09-29 MED ORDER — POTASSIUM CHLORIDE CRYS ER 20 MEQ PO TBCR
40.0000 meq | EXTENDED_RELEASE_TABLET | ORAL | Status: AC
  Administered 2019-09-29 (×2): 40 meq via ORAL
  Filled 2019-09-29 (×3): qty 2

## 2019-09-29 NOTE — Evaluation (Addendum)
Physical Therapy Evaluation Patient Details Name: [name] MRN: [id number] DOB: 23-Jan-1936 Today's Date: 09/29/2019   History of Present Illness  Pt is a 85 y/o female with PMH of HTN, diastolic heart failure, rheumatoid arthritis on chronic immunosuppression, GERD, and recent CVA of the left posterior frontal lobe on 2/18. Presents after being found acutely altered.  Treated for UTI as outpatient (but primary care called in new antibiotic yesterday). Admitted for acute encephalopathy, anticipate from untreated UTI.   Clinical Impression  Prior to admission, pt uses a walker for limited household ambulation and requires assist for some ADL's. Has residual right sided strength deficits from prior stroke. On PT evaluation, pt presents with decreased cognition, activity tolerance, weakness, and balance impairments. Requiring min assist for functional mobility. Ambulating 10 feet with a walker. Will benefit from HHPT to address deficits and maximize functional independence.     Follow Up Recommendations Home health PT;Supervision/Assistance - 24 hour     Equipment Recommendations  None recommended by PT    Recommendations for Other Services       Precautions / Restrictions Precautions Precautions: Fall Precaution Comments: impaired vision Restrictions Weight Bearing Restrictions: No      Mobility  Bed Mobility Overal bed mobility: Needs Assistance Bed Mobility: Supine to Sit     Supine to sit: Min guard     General bed mobility comments: increased time and effort, min guard for safety and balance; as well as to intiate task  Transfers Overall transfer level: Needs assistance Equipment used: Rolling walker (2 wheeled) Transfers: Sit to/from Stand Sit to Stand: Min assist;+2 safety/equipment         General transfer comment: min assist for safety/balance, multimodal cueing for initation and sequencing   Ambulation/Gait Ambulation/Gait assistance: Min assist;+2  safety/equipment Gait Distance (Feet): 10 Feet Assistive device: Rolling walker (2 wheeled) Gait Pattern/deviations: Step-through pattern;Decreased stride length;Trunk flexed Gait velocity: decreased   General Gait Details: MinA for stability, manual assist for maneuvering walker around obstacles  Stairs            Wheelchair Mobility    Modified Rankin (Stroke Patients Only)       Balance Overall balance assessment: Needs assistance Sitting-balance support: No upper extremity supported;Feet supported Sitting balance-Leahy Scale: Good     Standing balance support: Bilateral upper extremity supported;During functional activity Standing balance-Leahy Scale: Poor Standing balance comment: reliant on external support                             Pertinent Vitals/Pain Pain Assessment: Faces Faces Pain Scale: No hurt    Home Living Family/patient expects to be discharged to:: Private residence Living Arrangements: Spouse/significant other;Other relatives(niece ) Available Help at Discharge: Family;Available 24 hours/day;Personal care attendant Type of Home: House Home Access: Stairs to enter Entrance Stairs-Rails: Right Entrance Stairs-Number of Steps: 3 Home Layout: One level Home Equipment: Tub bench;Bedside commode;Walker - 2 wheels Additional Comments: Niece and PCAs A with bathing/dressing--home setup taken from prior admission, pt unable to report    Prior Function Level of Independence: Needs assistance   Gait / Transfers Assistance Needed: Uses RW  ADL's / Homemaking Assistance Needed: assist with bathing, dressing  Comments: Aide 2-3 days/wk; helps with bathing, dressing.-- taken from prior admission pt unable to report      Hand Dominance   Dominant Hand: Right    Extremity/Trunk Assessment   Upper Extremity Assessment Upper Extremity Assessment: RUE deficits/detail RUE Deficits / Details:  guarding with R shoulder ROM, able to tolerate  AAROM to approx 70 degrees during ADLs  RUE Coordination: decreased gross motor;decreased fine motor    Lower Extremity Assessment Lower Extremity Assessment: Overall WFL for tasks assessed    Cervical / Trunk Assessment Cervical / Trunk Assessment: Kyphotic  Communication   Communication: No difficulties  Cognition Arousal/Alertness: Lethargic;Awake/alert Behavior During Therapy: Flat affect Overall Cognitive Status: No family/caregiver present to determine baseline cognitive functioning Area of Impairment: Attention;Safety/judgement;Problem solving;Awareness;Orientation;Memory;Following commands                 Orientation Level: Disoriented to;Situation;Place;Person Current Attention Level: Focused Memory: Decreased short-term memory Following Commands: Follows one step commands inconsistently;Follows one step commands with increased time Safety/Judgement: Decreased awareness of safety;Decreased awareness of deficits Awareness: Intellectual Problem Solving: Difficulty sequencing;Requires verbal cues;Slow processing;Decreased initiation;Requires tactile cues General Comments: Pt oriented to name, but not birthday; able to choose correct month/year with choices.  Patient lethargic at times, but able to engage with increased cueing.  She requires signifincantly increased time for processing, sequencing and motor planning.       General Comments      Exercises     Assessment/Plan    PT Assessment Patient needs continued PT services  PT Problem List Decreased strength;Decreased mobility;Decreased activity tolerance;Decreased balance;Decreased range of motion;Decreased knowledge of use of DME;Pain;Cardiopulmonary status limiting activity;Decreased cognition       PT Treatment Interventions DME instruction;Therapeutic activities;Gait training;Therapeutic exercise;Patient/family education;Balance training;Stair training;Functional mobility training;Neuromuscular re-education     PT Goals (Current goals can be found in the Care Plan section)  Acute Rehab PT Goals Patient Stated Goal: none stated  PT Goal Formulation: With patient Time For Goal Achievement: 10/13/19 Potential to Achieve Goals: Fair    Frequency Min 3X/week   Barriers to discharge        Co-evaluation PT/OT/SLP Co-Evaluation/Treatment: Yes Reason for Co-Treatment: Necessary to address cognition/behavior during functional activity PT goals addressed during session: Mobility/safety with mobility OT goals addressed during session: ADL's and self-care       AM-PAC PT "6 Clicks" Mobility  Outcome Measure Help needed turning from your back to your side while in a flat bed without using bedrails?: None Help needed moving from lying on your back to sitting on the side of a flat bed without using bedrails?: A Little Help needed moving to and from a bed to a chair (including a wheelchair)?: A Little Help needed standing up from a chair using your arms (e.g., wheelchair or bedside chair)?: A Little Help needed to walk in hospital room?: A Little Help needed climbing 3-5 steps with a railing? : A Lot 6 Click Score: 18    End of Session Equipment Utilized During Treatment: Gait belt Activity Tolerance: Patient limited by fatigue Patient left: in chair;with call bell/phone within reach;with chair alarm set Nurse Communication: Mobility status PT Visit Diagnosis: Muscle weakness (generalized) (M62.81);Difficulty in walking, not elsewhere classified (R26.2);History of falling (Z91.81)    Time: [id number] PT Time Calculation (min) (ACUTE ONLY): 21 min   Charges:   PT Evaluation $PT Eval Moderate Complexity: 1 Mod            Wyona Almas, PT, DPT Acute Rehabilitation Services Pager [phone] Office [phone]   Deno Etienne 09/29/2019, 3:17 PM

## 2019-09-29 NOTE — Progress Notes (Addendum)
CM has attempted to reach pts spouse and niece without success. Messages left for both. TOC following.  Addendum: spoke to patient's niece. She doesn't have 24 hour supervision currently. She would be home alone in daytime as spouse works. Niece also works. Niece to speak with family about either coming together to provide 24 hour care vs SNF.  Niece to call CM back in am.

## 2019-09-29 NOTE — Progress Notes (Signed)
I concur with the assessment and med administration (8:00 a.m. to 5:00 p.m.) implemented and entered by [employer], SN.

## 2019-09-29 NOTE — Progress Notes (Signed)
Progress Note    [name]  [id number] DOB: 10/25/1935  DOA: 09/28/2019 PCP: [name], PA-C      Brief Narrative:    Medical records reviewed and are as summarized below:  [name] is an 84 y.o. 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  She presents after being found to be acutely altered. Husband had called EMS  in the morning of admission because she was disoriented and was incontinent of urine.   Patient had been being treated for a UTI as an outpatient on cephalexin, but review of records shows cultures from 2/24 were noted to be resistant.  The patient's primary care provider had called in a prescription for ciprofloxacin the day prior to admission but family had not been able to pick it up yet.  Reportedly, since the patient had been discharged home from the hospital on 2/21, she had not been eating or drinking much.       Assessment/Plan:   Principal Problem:   Acute encephalopathy Active Problems:   Tachycardia with heart rate 100-120 beats per minute   Urinary tract infection due to Klebsiella species   Diabetes mellitus type 2, controlled (HCC)   Hyponatremia   Normocytic anemia   Elevated AST (SGOT)   Acute toxic metabolic encephalopathy: MRI brain did not show any evidence of acute stroke or acute abnormality.  Acute Enterobacter aerogenes UTI:  Urine culture from 10/17/2019 showed Enterobacter aerogenes.  Patient with recent cultures from 2/24 for Klebsiella aerogenes which was resistant to cephalosporins.  Her PCP had tried to start her on ciprofloxacin, but family was not able to get the medication in time prior to her becoming altered.  Continue IV ciprofloxacin.  Follow-up culture sensitivity report.  Essential hypertension and tachycardia: Blood  pressure and heart rate improved.  Continue antihypertensives.  Acute hyponatremia: Improved.   Hypokalemia: Replete potassium.  Failure to thrive, underweight: Patient BMI is 17.37 kg/m.  At home patient reported not to be eating or drinking much.  Suspect symptoms could be secondary to urinary tract infection. -Assist patient with meals -Glucerna shakes  Recent CVA: Patient suffered a stroke at the left posterior frontal lobe on 2/18. -Continue aspirin and Plavix -Continue outpatient follow-up with neurology  Normocytic anemia: Chronic.  Hemoglobin 11.4 which appears near patient's baseline -Continue to monitor   Diabetes mellitus type 2: On admission glucose elevated up to 188.  Hemoglobin A1c noted to be 6.2 on 2/19.  Patient not on any diabetic medications at home.  Elevated blood glucoses likely related with infection -Hypoglycemic protocol -Continue Reglan -CBGs before every meal with very sensitive SSI  Hyperlipidemia: Lipid panel from 2/19 noted total cholesterol 106, HDL 34, LDL 34, triglycerides 190.  Home medications include atorvastatin 20 mg daily -Continue atorvastatin  Elevated AST: Acute on chronic.  AST elevated at 43 which appears similar to previous checks when reviewing records.  Unclear cause.     Body mass index is 18.47 kg/m.   Family Communication/Anticipated D/C date and plan/Code Status   DVT prophylaxis: Lovenox Code Status: DNR Family Communication: None Disposition Plan: Patient is from home.  Plan to discharge to home with home health versus SNF.  She will be discharged when mental status improves to baseline.      Subjective:   Patient is unable to provide any history.  She does not verbalize.  Objective:    Vitals:   09/29/19 0335 09/29/19 0727 09/29/19 1159 09/29/19 1632  BP: (!) 142/55 (!) 143/85 126/79 140/68  Pulse: 90 (!) 106 (!) 108 94  Resp: 16 18 18 18   Temp: 98 F (36.7 C) 98.7 F (37.1 C) 98.3 F (36.8 C)  98.9 F (37.2 C)  TempSrc: Oral Oral Oral Oral  SpO2: 99% 98% 98% (!) 68%  Weight:      Height:        Intake/Output Summary (Last 24 hours) at 09/29/2019 1701 Last data filed at 09/29/2019 1524 Gross per 24 hour  Intake 686.67 ml  Output 1500 ml  Net -813.33 ml   Filed Weights   09/28/19 1723  Weight: 51.9 kg    Exam:  GEN: NAD SKIN: No rash EYES: EOMI ENT: MMM CV: RRR PULM: CTA B ABD: soft, ND, NT, +BS CNS: Alert but confused.  She does not follow commands.  Exam is limited because she does not follow commands. EXT: No edema or tenderness   Data Reviewed:   I have personally reviewed following labs and imaging studies:  Labs: Labs show the following:   Basic Metabolic Panel: Recent Labs  Lab 09/22/19 1750 09/22/19 1750 09/28/19 0611 09/28/19 0611 09/28/19 0615 09/29/19 0605  NA 139  --  132*  --  132* 138  K 4.0   < > 3.6   < > 3.4* 3.1*  CL 102  --  99  --  97* 104  CO2 25  --  20*  --   --  25  GLUCOSE 188*  --  188*  --  185* 120*  BUN 10  --  7*  --  7* <5*  CREATININE 0.62  --  0.78  --  0.60 0.63  CALCIUM 9.2  --  9.0  --   --  8.4*  MG 1.6  --   --   --   --  1.7   < > = values in this interval not displayed.   GFR Estimated Creatinine Clearance: 45.2 mL/min (by C-G formula based on SCr of 0.63 mg/dL). Liver Function Tests: Recent Labs  Lab 09/22/19 1750 09/28/19 0611 09/29/19 0605  AST 48* 43* 49*  ALT 22 23 24   ALKPHOS 62 53 48  BILITOT 0.2 0.6 0.3  PROT 6.8 6.2* 5.8*  ALBUMIN 3.5* 3.0* 2.8*   No results for input(s): LIPASE, AMYLASE in the last 168 hours. No results for input(s): AMMONIA in the last 168 hours. Coagulation profile Recent Labs  Lab 09/28/19 0611  INR 1.0    CBC: Recent Labs  Lab 09/28/19 0611 09/28/19 0615 09/29/19 0605  WBC 5.6  --  4.5  NEUTROABS 2.5  --   --   HGB 11.4* 12.2 11.1*  HCT 36.6 36.0 35.1*  MCV 93.4  --  91.9  PLT 263  --  279   Cardiac Enzymes: No results for input(s): CKTOTAL,  CKMB, CKMBINDEX, TROPONINI in the last 168 hours. BNP (last 3 results) No results for input(s): PROBNP in the last 8760 hours. CBG: Recent Labs  Lab 09/28/19 1800 09/28/19 2108 09/29/19 0612 09/29/19 1157 09/29/19 1631  GLUCAP 119* 100* 99 145* 116*   D-Dimer: No results for input(s): DDIMER in the last 72 hours. Hgb A1c: No results for input(s): HGBA1C in the last 72 hours. Lipid Profile: No results for input(s): CHOL, HDL, LDLCALC, TRIG, CHOLHDL, LDLDIRECT in the last 72 hours. Thyroid function studies: No results for input(s): TSH, T4TOTAL, T3FREE,  THYROIDAB in the last 72 hours.  Invalid input(s): FREET3 Anemia work up: No results for input(s): VITAMINB12, FOLATE, FERRITIN, TIBC, IRON, RETICCTPCT in the last 72 hours. Sepsis Labs: Recent Labs  Lab 09/28/19 [id number] 09/28/19 1111 09/28/19 1302 09/29/19 0605  WBC 5.6  --   --  4.5  LATICACIDVEN  --  2.3* 2.3*  --     Microbiology Recent Results (from the past 240 hour(s))  Culture, Urine     Status: Abnormal   Collection Time: 09/22/19  4:20 PM   Specimen: Urine   URINE  Result Value Ref Range Status   Urine Culture, Routine Final report (A)  Final   Organism ID, Bacteria Klebsiella aerogenes (A)  Final    Comment: Greater than 100,000 colony forming units per mL formerly Enterobacter aerogenes    Antimicrobial Susceptibility Comment  Final    Comment:       ** S = Susceptible; I = Intermediate; R = Resistant **                    P = Positive; N = Negative             MICS are expressed in micrograms per mL    Antibiotic                 RSLT#1    RSLT#2    RSLT#3    RSLT#4 Amoxicillin/Clavulanic Acid    R Cefazolin                      R Cefepime                       S Ceftriaxone                    R Cefuroxime                     R Ciprofloxacin                  S Gentamicin                     S Imipenem                       S Levofloxacin                   S Meropenem                       S Nitrofurantoin                 R Tetracycline                   S Tobramycin                     S Trimethoprim/Sulfa             S   Urine culture     Status: Abnormal (Preliminary result)   Collection Time: 09/28/19  7:19 AM   Specimen: Urine, Random  Result Value Ref Range Status   Specimen Description URINE, RANDOM  Final   Special Requests NONE  Final   Culture (A)  Final    >=100,000 COLONIES/mL ENTEROBACTER AEROGENES SUSCEPTIBILITIES TO FOLLOW Performed at [facility], [address] [place], [place] [address]  Report Status PENDING  Incomplete  SARS CORONAVIRUS 2 (TAT 6-24 HRS) Nasopharyngeal Nasopharyngeal Swab     Status: None   Collection Time: 09/28/19 12:23 PM   Specimen: Nasopharyngeal Swab  Result Value Ref Range Status   SARS Coronavirus 2 NEGATIVE NEGATIVE Final    Comment: (NOTE) SARS-CoV-2 target nucleic acids are NOT DETECTED. The SARS-CoV-2 RNA is generally detectable in upper and lower respiratory specimens during the acute phase of infection. Negative results do not preclude SARS-CoV-2 infection, do not rule out co-infections with other pathogens, and should not be used as the sole basis for treatment or other patient management decisions. Negative results must be combined with clinical observations, patient history, and epidemiological information. The expected result is Negative. Fact Sheet for Patients: SugarRoll.be Fact Sheet for Healthcare Providers: https://www.woods-mathews.com/ This test is not yet approved or cleared by the [place] FDA and  has been authorized for detection and/or diagnosis of SARS-CoV-2 by FDA under an Emergency Use Authorization (EUA). This EUA will remain  in effect (meaning this test can be used) for the duration of the COVID-19 declaration under Section 56 4(b)(1) of the Act, 21 U.S.C. section 360bbb-3(b)(1), unless the authorization is terminated or revoked  sooner. Performed at [facility], [place], [place], [place] [address]     Procedures and diagnostic studies:  MR BRAIN WO CONTRAST  Result Date: 09/28/2019 CLINICAL DATA:  Altered mental status. Additional history obtained from [place]: MRI HEAD WITHOUT CONTRAST TECHNIQUE: Multiplanar, multiecho pulse sequences of the brain and surrounding structures were obtained without intravenous contrast. COMPARISON:  Noncontrast head CT performed earlier the same day 09/28/2019, brain MRI 09/16/2019 FINDINGS: Brain: The patient was unable to tolerate the full examination. As a result, the axial T1 weighted sequence is moderately motion degraded and a coronal T2 weighted sequence could not be obtained. There is no evidence of acute infarct. No evidence of intracranial mass. No midline shift or extra-axial fluid collection. No chronic intracranial blood products. There is a small focus of T2/FLAIR hyperintensity with T2 shine through in the subcortical left posterior frontal lobe at site of a known, now subacute, infarct. Moderate T2/FLAIR hyperintense signal changes within cerebral white matter are unchanged and consistent with chronic small vessel ischemic disease. Stable, moderate generalized parenchymal atrophy. Vascular: Flow voids maintained within the proximal large arterial vessels. Skull and upper cervical spine: No focal marrow lesion. Sinuses/Orbits: Visualized orbits demonstrate no acute abnormality. Chronic deformity of the left lamina papyracea. No significant paranasal sinus disease or mastoid effusion. IMPRESSION: 1. Prematurely terminated examination as described. 2. No evidence of acute intracranial abnormality. 3. Expected evolution of a known small subacute infarct within the posterior left frontal lobe. 4. Stable moderate generalized parenchymal atrophy and chronic small vessel ischemic disease. Electronically Signed   By: Kellie Simmering DO   On:  09/28/2019 12:18   DG CHEST PORT 1 VIEW  Result Date: 09/28/2019 CLINICAL DATA:  Shortness of breath. EXAM: PORTABLE CHEST 1 VIEW COMPARISON:  September 16, 2019. FINDINGS: Stable cardiomegaly. No pneumothorax or pleural effusion is noted. Both lungs are clear. The visualized skeletal structures are unremarkable. IMPRESSION: No active disease. Electronically Signed   By: Marijo Conception M.D.   On: 09/28/2019 14:56   CT HEAD CODE STROKE WO CONTRAST  Result Date: 09/28/2019 CLINICAL DATA:  Code stroke. Facial droop. Aphasia. Last seen normal 22:00 Last evening. Recent infarct. EXAM: CT HEAD WITHOUT CONTRAST TECHNIQUE: Contiguous axial images were obtained from the  base of the skull through the vertex without intravenous contrast. COMPARISON:  MR head without contrast 09/16/2019. CT head and neck and CT perfusion 09/16/2019 FINDINGS: Brain: Mild atrophy and moderate white matter disease is stable. Basal ganglia are intact. Insular ribbon is normal. Acute or focal cortical abnormality is present. The ventricles are proportionate to the degree of atrophy. No significant extraaxial fluid collection is present. The brainstem and cerebellum are within normal limits. Vascular: Atherosclerotic calcifications are present within the cavernous internal carotid arteries bilaterally and at the dural margin of the right vertebral artery. No hyperdense vessel is present. Skull: Insert normal skull No significant extracranial soft tissue lesion is present. Sinuses/Orbits: The paranasal sinuses and mastoid air cells are clear. A remote left orbital blowout fracture is present. Globes and orbits are otherwise within normal limits. ASPECTS [facility] Stroke Program Early CT Score) - Ganglionic level infarction (caudate, lentiform nuclei, internal capsule, insula, M1-M3 cortex): 7/7 - Supraganglionic infarction (M4-M6 cortex): 3/3 Total score (0-10 with 10 being normal): 10/10 IMPRESSION: 1. No acute intracranial abnormality or  significant interval change. 2. Stable atrophy and white matter disease. This likely reflects the sequela of chronic microvascular ischemia. 3. ASPECTS is 10/10 The above was relayed via text pager to Dr. Roland Rack on 09/28/2019 at 05:59 . Electronically Signed   By: San Morelle M.D.   On: 09/28/2019 06:00    Medications:   . aspirin EC  81 mg Oral Q M,W,F  . atorvastatin  20 mg Oral Daily  . clopidogrel  75 mg Oral Daily  . docusate sodium  100 mg Oral BID  . enoxaparin (LOVENOX) injection  40 mg Subcutaneous Q24H  . feeding supplement (GLUCERNA SHAKE)  237 mL Oral TID BM  . folic acid  1 mg Oral Daily  . insulin aspart  0-6 Units Subcutaneous TID WC  . metoCLOPramide  5 mg Oral TID AC  . metoprolol tartrate  25 mg Oral BID  . mometasone-formoterol  2 puff Inhalation BID  . sodium chloride flush  3 mL Intravenous Q12H  . cyanocobalamin  1,000 mcg Oral Daily   Continuous Infusions: . ciprofloxacin 400 mg (09/29/19 0829)     LOS: 1 day   Vencent Hauschild  [facility]     09/29/2019, 5:01 PM

## 2019-09-29 NOTE — Evaluation (Signed)
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 Presents after being found acutely altered.  Treated for UTI as outpatient (but primary care called in new antibiotic yesterday). Admitted for acute encephalopathy, anticipate from untreated UTI.    Clinical Impression   Patient admitted for above and limited by problem list below, including impaired cognition, decreased activity tolerance, generalized weakness, and impaired balance.  She is oriented to name (but not birthday), month/year with choices, requires increased time for processing, initiation and sequencing, with poor attention to task and following commands inconsistently. Requires min-total assist for ADLs (hand over hand assist required to wash hands/arms, but able to wash face with setup assist), transfers with min assist (+2 present for safety), and requires assist with RW mgmt during mobility. Limited vision, further assessment required; see below for details.  Pt unable to provide PLOF, but from recent admission she appears to have good support at home from spouse, PCAs and niece. She will benefit from continued OT services while admitted and after dc at [facility] level to optimize return to PLOF with ADLs, mobility and decrease burden of care.     Follow Up Recommendations  Home health OT;Supervision/Assistance - 24 hour(if family cannot provide 24/7 assist, may need SNF )    Equipment Recommendations  3 in 1 bedside commode    Recommendations for Other Services       Precautions / Restrictions Precautions Precautions: Fall Precaution Comments: impaired vision Restrictions Weight Bearing Restrictions: No      Mobility Bed Mobility Overal bed  mobility: Needs Assistance Bed Mobility: Supine to Sit     Supine to sit: Min guard     General bed mobility comments: increased time and effort, min guard for safety and balance; as well as to intiate task  Transfers Overall transfer level: Needs assistance Equipment used: Rolling walker (2 wheeled) Transfers: Sit to/from Stand Sit to Stand: Min assist;+2 safety/equipment         General transfer comment: min assist for safety/balance, multimodal cueing for initation and sequencing     Balance Overall balance assessment: Needs assistance Sitting-balance support: No upper extremity supported;Feet supported Sitting balance-Leahy Scale: Good     Standing balance support: Bilateral upper extremity supported;During functional activity Standing balance-Leahy Scale: Poor Standing balance comment: reliant on external support                           ADL either performed or assessed with clinical judgement   ADL Overall ADL's : Needs assistance/impaired     Grooming: Minimal assistance;Sitting   Upper Body Bathing: Moderate assistance;Sitting Upper Body Bathing Details (indicate cue type and reason): requires hand over hand support to wash B UEs  Lower Body Bathing: Maximal assistance;Sit to/from stand Lower Body Bathing Details (indicate cue type and reason): min assist sit to stand, incresaed assist to sequence ADL tasks  Upper Body Dressing : Moderate assistance;Sitting Upper Body Dressing Details (indicate cue type and reason): donning new gown  Lower Body Dressing: Total assistance;Sit to/from stand   Toilet Transfer: Minimal assistance;Ambulation;RW Toilet Transfer Details (indicate cue type and reason): simulated to recliner          Functional mobility during ADLs: Minimal assistance;Rolling walker;+2 for  safety/equipment General ADL Comments: pt limited by impaired cognition, decreased activity tolerance and impaired balance      Vision    Additional Comments: pt inconsistent with general testing, at times reports unable to find call bell; able to tell me the correct number of fingers in L visual field once only, then unable to repeat task or locate any other fingers in visual field; continue assessment      Perception     Praxis      Pertinent Vitals/Pain Pain Assessment: Faces Faces Pain Scale: No hurt     Hand Dominance Right   Extremity/Trunk Assessment Upper Extremity Assessment Upper Extremity Assessment: RUE deficits/detail;Generalized weakness RUE Deficits / Details: guarding with R shoulder ROM, able to tolerate AAROM to approx 70 degrees during ADLs  RUE Coordination: decreased gross motor;decreased fine motor   Lower Extremity Assessment Lower Extremity Assessment: Defer to PT evaluation   Cervical / Trunk Assessment Cervical / Trunk Assessment: Kyphotic   Communication Communication Communication: No difficulties   Cognition Arousal/Alertness: Lethargic;Awake/alert Behavior During Therapy: Flat affect Overall Cognitive Status: No family/caregiver present to determine baseline cognitive functioning Area of Impairment: Attention;Safety/judgement;Problem solving;Awareness;Orientation;Memory;Following commands                 Orientation Level: Disoriented to;Situation;Place;Person Current Attention Level: Focused Memory: Decreased short-term memory Following Commands: Follows one step commands inconsistently;Follows one step commands with increased time Safety/Judgement: Decreased awareness of safety;Decreased awareness of deficits Awareness: Intellectual Problem Solving: Difficulty sequencing;Requires verbal cues;Slow processing;Decreased initiation;Requires tactile cues General Comments: Pt oriented to name, but not birthday; able to choose correct month/year with choices.  Patient lethargic at times, but able to engage with increased cueing.  She requires signifincantly increased time for  processing, sequencing and motor planning.    General Comments       Exercises     Shoulder Instructions      Home Living Family/patient expects to be discharged to:: Private residence Living Arrangements: Spouse/significant other;Other relatives(niece ) Available Help at Discharge: Family;Available 24 hours/day;Personal care attendant Type of Home: House Home Access: Stairs to enter [employer] of Steps: 3 Entrance Stairs-Rails: Right Home Layout: One level     Bathroom Shower/Tub: Teacher, early years/pre: Standard     Home Equipment: Tub bench;Bedside commode;Walker - 2 wheels   Additional Comments: Niece and PCAs A with bathing/dressing--home setup taken from prior admission, pt unable to report      Prior Functioning/Environment Level of Independence: Needs assistance  Gait / Transfers Assistance Needed: Uses RW ADL's / Homemaking Assistance Needed: assist with bathing, dressing   Comments: Aide 2-3 days/wk; helps with bathing, dressing.-- taken from prior admission pt unable to report         OT Problem List: Decreased strength;Decreased activity tolerance;Impaired balance (sitting and/or standing);Decreased range of motion;Decreased coordination;Impaired vision/perception;Decreased cognition;Decreased safety awareness;Decreased knowledge of use of DME or AE;Decreased knowledge of precautions;Cardiopulmonary status limiting activity      OT Treatment/Interventions: Self-care/ADL training;DME and/or AE instruction;Patient/family education;Balance training;Therapeutic exercise;Therapeutic activities;Visual/perceptual remediation/compensation;Cognitive remediation/compensation    OT Goals(Current goals can be found in the care plan section) Acute Rehab OT Goals Patient Stated Goal: none stated  OT Goal Formulation: Patient unable to participate in goal setting Time For Goal Achievement: 10/13/19 Potential to Achieve Goals: Good  OT Frequency:  Min 2X/week   Barriers to D/C:            Co-evaluation PT/OT/SLP Co-Evaluation/Treatment: Yes Reason for Co-Treatment: For patient/therapist safety;To address functional/ADL transfers;Necessary to address  cognition/behavior during functional activity   OT goals addressed during session: ADL's and self-care      AM-PAC OT "6 Clicks" Daily Activity     Outcome Measure Help from another person eating meals?: A Little Help from another person taking care of personal grooming?: A Little Help from another person toileting, which includes using toliet, bedpan, or urinal?: A Lot Help from another person bathing (including washing, rinsing, drying)?: A Lot Help from another person to put on and taking off regular upper body clothing?: A Lot Help from another person to put on and taking off regular lower body clothing?: Total 6 Click Score: 13   End of Session Equipment Utilized During Treatment: Gait belt;Rolling walker Nurse Communication: Mobility status  Activity Tolerance: Patient tolerated treatment well Patient left: in chair;with call bell/phone within reach;with chair alarm set  OT Visit Diagnosis: Other abnormalities of gait and mobility (R26.89);Muscle weakness (generalized) (M62.81);Other symptoms and signs involving cognitive function;Cognitive communication deficit (R41.841)                Time: 1108-1130 OT Time Calculation (min): 22 min Charges:  OT General Charges $OT Visit: 1 Visit OT Evaluation $OT Eval Moderate Complexity: 1 Mod  Jolaine Artist, OT Acute Rehabilitation Services Pager [phone] Office [phone]    Delight Stare 09/29/2019, 12:33 PM

## 2019-09-30 ENCOUNTER — Encounter: Payer: Medicare Other | Admitting: Internal Medicine

## 2019-09-30 DIAGNOSIS — D649 Anemia, unspecified: Secondary | ICD-10-CM

## 2019-09-30 LAB — BASIC METABOLIC PANEL
Anion gap: 9 (ref 5–15)
BUN: <5 mg/dL — ABNORMAL LOW (ref 8–23)
CO2: 22 mmol/L (ref 22–32)
Calcium: 8.5 mg/dL — ABNORMAL LOW (ref 8.9–10.3)
Chloride: 106 mmol/L (ref 98–111)
Creatinine, Ser: 0.61 mg/dL (ref 0.44–1.00)
GFR calc Af Amer: >60 mL/min (ref >60)
GFR calc non Af Amer: >60 mL/min (ref >60)
Glucose, Bld: 103 mg/dL — ABNORMAL HIGH (ref 70–99)
Potassium: 3.9 mmol/L (ref 3.5–5.1)
Sodium: 137 mmol/L (ref 135–145)

## 2019-09-30 LAB — GLUCOSE, CAPILLARY
Glucose-Capillary: 108 mg/dL — ABNORMAL HIGH (ref 70–99)
Glucose-Capillary: 127 mg/dL — ABNORMAL HIGH (ref 70–99)
Glucose-Capillary: 104 mg/dL — ABNORMAL HIGH (ref 70–99)
Glucose-Capillary: 151 mg/dL — ABNORMAL HIGH (ref 70–99)

## 2019-09-30 MED ORDER — SODIUM CHLORIDE 0.9 % IV SOLN: INTRAVENOUS | Status: DC

## 2019-09-30 MED ORDER — IPRATROPIUM-ALBUTEROL 0.5-2.5 (3) MG/3ML IN SOLN: 3.0000 mL | Freq: Four times a day (QID) | RESPIRATORY_TRACT | 0 refills | Status: AC | PRN

## 2019-09-30 NOTE — Plan of Care (Signed)
Plan of care reviwed with pt at bedside. Husband just arrived to floor and plan reviewed with him. Niece called and updated. Pt on Ns@50ml /hr. PO intake poor unless constantly prompted. Pt forgetful, disoriented, but easily redirected. Husband asking where her hair went. Pt stated, "I pulled some of the braids out."  Safety measures in place. Call bell in reach. Will continue to monitor.  Problem: Education: Goal: Knowledge of General Education information will improve Description: Including pain rating scale, medication(s)/side effects and non-pharmacologic comfort measures Outcome: Progressing   Problem: Health Behavior/Discharge Planning: Goal: Ability to manage health-related needs will improve Outcome: Progressing   Problem: Clinical Measurements: Goal: Ability to maintain clinical measurements within normal limits will improve Outcome: Progressing Goal: Will remain free from infection Outcome: Progressing Goal: Diagnostic test results will improve Outcome: Progressing Goal: Respiratory complications will improve Outcome: Progressing Goal: Cardiovascular complication will be avoided Outcome: Progressing   Problem: Activity: Goal: Risk for activity intolerance will decrease Outcome: Progressing   Problem: Nutrition: Goal: Adequate nutrition will be maintained Outcome: Progressing   Problem: Coping: Goal: Level of anxiety will decrease Outcome: Progressing   Problem: Elimination: Goal: Will not experience complications related to bowel motility Outcome: Progressing Goal: Will not experience complications related to urinary retention Outcome: Progressing   Problem: Pain Managment: Goal: General experience of comfort will improve Outcome: Progressing   Problem: Safety: Goal: Ability to remain free from injury will improve Outcome: Progressing   Problem: Skin Integrity: Goal: Risk for impaired skin integrity will decrease Outcome: Progressing   Problem:  Education: Goal: Knowledge of disease or condition will improve Outcome: Progressing Goal: Knowledge of secondary prevention will improve Outcome: Progressing Goal: Knowledge of patient specific risk factors addressed and post discharge goals established will improve Outcome: Progressing Goal: Individualized Educational Video(s) Outcome: Progressing   Problem: Coping: Goal: Will verbalize positive feelings about self Outcome: Progressing Goal: Will identify appropriate support needs Outcome: Progressing   Problem: Health Behavior/Discharge Planning: Goal: Ability to manage health-related needs will improve Outcome: Progressing   Problem: Self-Care: Goal: Ability to participate in self-care as condition permits will improve Outcome: Progressing Goal: Verbalization of feelings and concerns over difficulty with self-care will improve Outcome: Progressing Goal: Ability to communicate needs accurately will improve Outcome: Progressing   Problem: Nutrition: Goal: Risk of aspiration will decrease Outcome: Progressing Goal: Dietary intake will improve Outcome: Progressing   Problem: Ischemic Stroke/TIA Tissue Perfusion: Goal: Complications of ischemic stroke/TIA will be minimized Outcome: Progressing

## 2019-09-30 NOTE — NC FL2 (Signed)
[place] LEVEL OF CARE SCREENING TOOL     IDENTIFICATION  Patient Name: [name] Birthdate: [birth year] Sex: female Admission Date (Current Location): 09/28/2019  [facility] and [place] Number:  Herbalist and Address:  [facility], [place] [address] [place], [place] [address]      Provider Number: [id number]  Attending Physician Name and Address:  Cristal Ford, DO  Relative Name and Phone Number:       Current Level of Care: Hospital Recommended Level of Care: [place] Prior Approval Number:    Date Approved/Denied:   PASRR Number: [id number]  Discharge Plan: SNF    Current Diagnoses: Patient Active Problem List   Diagnosis Date Noted  . Acute encephalopathy 09/28/2019  . Hyponatremia 09/28/2019  . Normocytic anemia 09/28/2019  . Elevated AST (SGOT) 09/28/2019  . Protein-calorie malnutrition, severe 09/17/2019  . Acute ischemic stroke ([place]) 09/16/2019  . DNR (do not resuscitate) 08/02/2019  . Dehydration 08/01/2019  . Urinary tract infection symptoms 07/31/2019  . Diabetic gastroparesis ([place]) 07/31/2019  . Unexplained weight loss 07/12/2019  . Non-intractable vomiting   . Nausea, vomiting, and diarrhea 05/30/2019  . Hypokalemia 05/30/2019  . Hypomagnesemia 05/30/2019  . Gastritis 05/30/2019  . Elevated lipase 05/30/2019  . SIRS (systemic inflammatory response syndrome) ([place]) 05/30/2019  . History of COPD   . Other chronic pain 04/22/2019  . Dyspnea 08/10/2018  . Acute diastolic heart failure ([place]) 06/15/2018  . Essential hypertension 06/15/2018  . 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 Gastric mass   . Sepsis secondary to UTI ([place])   . Diabetes mellitus type 2, controlled ([place])   . AKI (acute kidney injury) ([place])   . Chronic diastolic CHF (congestive heart failure) ([place])   . Urinary tract infection due to Klebsiella species 01/09/2017  . Sepsis ([place]) 01/09/2017  . Constipation 01/09/2017  .  COPD (chronic obstructive pulmonary disease) ([place]) 01/09/2017  . Multiple falls 06/14/2015  . Weight loss 06/14/2015  . Acute respiratory failure with hypoxia ([place]) 06/14/2015  . Orthostasis 06/14/2015  . Acute respiratory failure ([place]) 06/14/2015  . Elevated lactic acid level 01/22/2015  . 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 Chronic diastolic heart failure, NYHA class 1 ([place]) 01/22/2015  . Tachycardia with heart rate 100-120 beats per minute 04/08/2014  . DM (diabetes mellitus) ([place]) 04/08/2014  . Chest pain 04/07/2014  . Syncope 04/07/2014  . Hyperglycemia 12/11/2012  . Syncope and collapse 12/11/2012  . Dehydration, mild 12/11/2012  . Noncompliance 12/11/2012  . 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 Rheumatoid arthritis ([place]) 12/11/2012    Orientation RESPIRATION BLADDER Height & Weight     Self, Place  Normal Incontinent Weight: 114 lb 6.7 oz (51.9 kg) Height:  5\' 6"  (167.6 cm)  BEHAVIORAL SYMPTOMS/MOOD NEUROLOGICAL BOWEL NUTRITION STATUS      Incontinent Diet(see discharge packet)  AMBULATORY STATUS COMMUNICATION OF NEEDS Skin   Limited Assist Verbally Normal                       Personal Care Assistance Level of Assistance  Bathing, Feeding, Dressing Bathing Assistance: Limited assistance Feeding assistance: Independent Dressing Assistance: Limited assistance     Functional Limitations Info             SPECIAL CARE FACTORS FREQUENCY  PT (By licensed PT), OT (By licensed OT)     PT Frequency: 5x/wk OT Frequency: 5x/wk  Contractures Contractures Info: Not present    Additional Factors Info  Code Status, Allergies, Insulin Sliding Scale Code Status Info: DNR Allergies Info: Lactose Intolerance   Insulin Sliding Scale Info: Novolog 0-6 units 3x day with meals       Current Medications (09/30/2019):  This is the current hospital active medication list Current Facility-Administered Medications  Medication Dose  Route Frequency Provider Last Rate Last Admin  . acetaminophen (TYLENOL) tablet 650 mg  650 mg Oral Q6H PRN Norval [place], MD       Or  . acetaminophen (TYLENOL) suppository 650 mg  650 mg Rectal Q6H PRN Smith, Rondell A, MD      . albuterol (PROVENTIL) (2.5 MG/3ML) 0.083% nebulizer solution 2.5 mg  2.5 mg Nebulization Q6H PRN Smith, Rondell A, MD      . aspirin EC tablet 81 mg  81 mg Oral Q M,W,F Smith, Rondell A, MD   81 mg at 09/30/19 1021  . atorvastatin (LIPITOR) tablet 20 mg  20 mg Oral Daily Smith, Rondell A, MD   20 mg at 09/30/19 1022  . ciprofloxacin (CIPRO) IVPB 400 mg  400 mg Intravenous Q12H Smith, Rondell A, MD 200 mL/hr at 09/30/19 0013 400 mg at 09/30/19 0013  . clopidogrel (PLAVIX) tablet 75 mg  75 mg Oral Daily Tamala Julian, Rondell A, MD   75 mg at 09/30/19 1022  . dicyclomine (BENTYL) capsule 10 mg  10 mg Oral Q8H PRN Fuller Plan A, MD   10 mg at 09/30/19 1023  . docusate sodium (COLACE) capsule 100 mg  100 mg Oral BID Tamala Julian, Rondell A, MD   100 mg at 09/30/19 1021  . enoxaparin (LOVENOX) injection 40 mg  40 mg Subcutaneous Q24H Tamala Julian, Rondell A, MD   40 mg at 09/29/19 1206  . feeding supplement (GLUCERNA SHAKE) (GLUCERNA SHAKE) liquid 237 mL  237 mL Oral TID BM Smith, Rondell A, MD   237 mL at 09/29/19 1539  . folic acid (FOLVITE) tablet 1 mg  1 mg Oral Daily Smith, Rondell A, MD   1 mg at 09/30/19 1021  . insulin aspart (novoLOG) injection 0-6 Units  0-6 Units Subcutaneous TID WC Smith, Rondell A, MD      . metoCLOPramide (REGLAN) tablet 5 mg  5 mg Oral TID AC Smith, Rondell A, MD   5 mg at 09/30/19 0801  . metoprolol tartrate (LOPRESSOR) tablet 25 mg  25 mg Oral BID Fuller Plan A, MD   25 mg at 09/30/19 1021  . mometasone-formoterol (DULERA) 200-5 MCG/ACT inhaler 2 puff  2 puff Inhalation BID Fuller Plan A, MD   2 puff at 09/30/19 0802  . ondansetron (ZOFRAN) tablet 4 mg  4 mg Oral Q6H PRN Fuller Plan A, MD       Or  . ondansetron (ZOFRAN) injection 4 mg  4 mg  Intravenous Q6H PRN Tamala Julian, Rondell A, MD      . simethicone (MYLICON) chewable tablet 80 mg  80 mg Oral QID PRN Smith, Rondell A, MD      . sodium chloride flush (NS) 0.9 % injection 3 mL  3 mL Intravenous Q12H Smith, Rondell A, MD   3 mL at 09/30/19 1024  . vitamin B-12 (CYANOCOBALAMIN) tablet 1,000 mcg  1,000 mcg Oral Daily Fuller Plan A, MD   1,000 mcg at 09/30/19 1021     Discharge Medications: Please see discharge summary for a list of discharge medications.  Relevant Imaging Results:  Relevant Lab Results:  Additional Information SNN [id number]  Emeterio Reeve, [place]

## 2019-09-30 NOTE — Addendum Note (Signed)
Addended by: Nicki Guadalajara on: 09/30/2019 06:17 PM   Modules accepted: Orders

## 2019-09-30 NOTE — Progress Notes (Signed)
PROGRESS NOTE    [name]  [id number] DOB: [birth year] DOA: 09/28/2019 PCP: [name], PA-C   Brief Narrative:  HPI on 09/28/2019 by Dr. Fuller Plan [name] is a 84 y.o. 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  She presents after being found to be acutely altered. Husband had called EMS this morning because she was disoriented and was incontinent of urine.   Patient had been being treated for a UTI as an outpatient on cephalexin, but review of records shows cultures from 2/24 were noted to be resistant.  The patient's primary care provider had called in a prescription for ciprofloxacin yesterday, but family had not been able to pick it up yet.  Since the patient had been discharged home from the hospital on 2/21, family reports that she had not been eating or drinking much.  Patient denies having any complaints of pain or shortness of breath.  Interim history Admitted for AMS. Found to have Enterobacter UTI, currently on ciprofloxacin. Assessment & Plan   Acute metabolic encephalopathy -possibly secondary to UTI vs failure to thrive and poor oral intake  -MRI brain: No evidence of acute intercranial normality.  Expected evolution of known small subacute infarct within the posterior left frontal lobe.  Acute Enterobacter aerogenes UTI -Urine culture from 10/17/2019 showed Enterobacter aerogenes -Urine culture from 09/22/2019 showed Klebsiella originates, resistance to cephalosporins -PCP had tried to start patient on ciprofloxacin however patient's family was unable to obtain medication -Currently on IV ciprofloxacin -pending susceptibilities   Essential hypertension and tachycardia -BP stable  -Continue metoprolol  Acute hyponatremia -resolved  Hypokalemia -resolved with  replacement  Failure to thrive/underweight -BMI of 17.37 -At home, patient reported not to be eating or drinking much -Suspect symptoms to be secondary to UTI -Continue supplements -started on IVF due to poor oral intake  Recent CVA -Patient suffered a stroke, left posterior frontal lobe on 09/16/2019 -Follow-up with neurology as an outpatient  -Continue Plavix and aspirin  Normocytic anemia -hemoglobin stable, continue to monitor  Diabetes mellitus, type II -hemoglobin A1c 6.2 on 09/17/2019 -Not on any diabetic medications at home, likely diet controlled -Continue insulin sliding scale with CBG monitoring  Hyperlipidemia -Recent lipid panel from 09/17/2019 showed total cholesterol 106, HDL 34, LDL 34, triglycerides 190 -Continue statin  Elevated AST -Acute on chronic -AST elevated to 49 (was higher in Jan 2021) -Continue to monitor   Deconditioning -PT recommended home health -Recommended home health, if patient's family unable to provide 24/7 assist may need SNF.  3 and 1 bedside commode.  DVT Prophylaxis Lovenox  Code Status: DNR  Family Communication: None at bedside.  Attempted to reach out to husband.  Disposition Plan: Admitted from home for acute metabolic encephalopathy.  Patient found to have UTI, currently on IV antibiotics and pending susceptibilities.  Disposition TBD.  Consultants Neurology   Procedures  None  Antibiotics   Anti-infectives (From admission, onward)   Start     Dose/Rate Route Frequency Ordered Stop   09/28/19 2000  ciprofloxacin (CIPRO) IVPB 400 mg     400 mg 200 mL/hr over 60 Minutes Intravenous Every 12 hours 09/28/19 1141     09/28/19 0745  ciprofloxacin (CIPRO) IVPB 400 mg     400 mg 200 mL/hr over 60 Minutes Intravenous  Once 09/28/19 0741 09/28/19 0930      Subjective:   [name] seen  and examined today.  Patient with no complaints. States something is wrong and believes she is here for a stroke.    Objective:    Vitals:   09/29/19 2148 09/29/19 2356 09/30/19 0536 09/30/19 0744  BP:  (!) 168/77 (!) 142/73 128/72  Pulse:  (!) 110 (!) 101 100  Resp:  20 18 19   Temp:  98.3 F (36.8 C) 98.3 F (36.8 C) 98.7 F (37.1 C)  TempSrc:  Oral Oral Oral  SpO2: 97% 99% 99% 100%  Weight:      Height:        Intake/Output Summary (Last 24 hours) at 09/30/2019 1100 Last data filed at 09/30/2019 1029 Gross per 24 hour  Intake 630 ml  Output 700 ml  Net -70 ml   Filed Weights   09/28/19 1723  Weight: 51.9 kg    Exam  General: Well developed, well nourished, NAD, appears stated age  33: NCAT, mucous membranes moist.   Cardiovascular: S1 S2 auscultated, RRR  Respiratory: Clear to auscultation bilaterally  Abdomen: Soft, nontender, nondistended, + bowel sounds  Extremities: warm dry without cyanosis clubbing or edema  Neuro: AAOx2 (self, place), nonfocal  Psych: Appropriate mood and affect, pleasantly confused   Data Reviewed: I have personally reviewed following labs and imaging studies  CBC: Recent Labs  Lab 09/28/19 0611 09/28/19 0615 09/29/19 0605  WBC 5.6  --  4.5  NEUTROABS 2.5  --   --   HGB 11.4* 12.2 11.1*  HCT 36.6 36.0 35.1*  MCV 93.4  --  91.9  PLT 263  --  [id number]   Basic Metabolic Panel: Recent Labs  Lab 09/28/19 0611 09/28/19 0615 09/29/19 0605 09/30/19 0212  NA 132* 132* 138 137  K 3.6 3.4* 3.1* 3.9  CL 99 97* 104 106  CO2 20*  --  25 22  GLUCOSE 188* 185* 120* 103*  BUN 7* 7* <5* <5*  CREATININE 0.78 0.60 0.63 0.61  CALCIUM 9.0  --  8.4* 8.5*  MG  --   --  1.7  --    GFR: Estimated Creatinine Clearance: 45.2 mL/min (by C-G formula based on SCr of 0.61 mg/dL). Liver Function Tests: Recent Labs  Lab 09/28/19 0611 09/29/19 0605  AST 43* 49*  ALT 23 24  ALKPHOS 53 48  BILITOT 0.6 0.3  PROT 6.2* 5.8*  ALBUMIN 3.0* 2.8*   No results for input(s): LIPASE, AMYLASE in the last 168 hours. No results for input(s): AMMONIA in the last 168  hours. Coagulation Profile: Recent Labs  Lab 09/28/19 0611  INR 1.0   Cardiac Enzymes: No results for input(s): CKTOTAL, CKMB, CKMBINDEX, TROPONINI in the last 168 hours. BNP (last 3 results) No results for input(s): PROBNP in the last 8760 hours. HbA1C: No results for input(s): HGBA1C in the last 72 hours. CBG: Recent Labs  Lab 09/29/19 0612 09/29/19 1157 09/29/19 1631 09/29/19 2125 09/30/19 0624  GLUCAP 99 145* 116* 87 108*   Lipid Profile: No results for input(s): CHOL, HDL, LDLCALC, TRIG, CHOLHDL, LDLDIRECT in the last 72 hours. Thyroid Function Tests: No results for input(s): TSH, T4TOTAL, FREET4, T3FREE, THYROIDAB in the last 72 hours. Anemia Panel: No results for input(s): VITAMINB12, FOLATE, FERRITIN, TIBC, IRON, RETICCTPCT in the last 72 hours. Urine analysis:    Component Value Date/Time   COLORURINE YELLOW 09/28/2019 0719   APPEARANCEUR HAZY (A) 09/28/2019 0719   LABSPEC 1.004 (L) 09/28/2019 0719   PHURINE 6.0 09/28/2019 0719   GLUCOSEU NEGATIVE 09/28/2019 0719  HGBUR NEGATIVE 09/28/2019 0719   BILIRUBINUR NEGATIVE 09/28/2019 0719   BILIRUBINUR negative 09/22/2019 1541   KETONESUR NEGATIVE 09/28/2019 0719   PROTEINUR NEGATIVE 09/28/2019 0719   UROBILINOGEN 0.2 09/22/2019 1541   UROBILINOGEN 2.0 (H) 08/13/2019 1542   NITRITE NEGATIVE 09/28/2019 0719   LEUKOCYTESUR LARGE (A) 09/28/2019 0719   Sepsis Labs: @LABRCNTIP (procalcitonin:4,lacticidven:4)  ) Recent Results (from the past 240 hour(s))  Culture, Urine     Status: Abnormal   Collection Time: 09/22/19  4:20 PM   Specimen: Urine   URINE  Result Value Ref Range Status   Urine Culture, Routine Final report (A)  Final   Organism ID, Bacteria Klebsiella aerogenes (A)  Final    Comment: Greater than 100,000 colony forming units per mL formerly Enterobacter aerogenes    Antimicrobial Susceptibility Comment  Final    Comment:       ** S = Susceptible; I = Intermediate; R = Resistant **                     P = Positive; N = Negative             MICS are expressed in micrograms per mL    Antibiotic                 RSLT#1    RSLT#2    RSLT#3    RSLT#4 Amoxicillin/Clavulanic Acid    R Cefazolin                      R Cefepime                       S Ceftriaxone                    R Cefuroxime                     R Ciprofloxacin                  S Gentamicin                     S Imipenem                       S Levofloxacin                   S Meropenem                      S Nitrofurantoin                 R Tetracycline                   S Tobramycin                     S Trimethoprim/Sulfa             S   Urine culture     Status: Abnormal (Preliminary result)   Collection Time: 09/28/19  7:19 AM   Specimen: Urine, Random  Result Value Ref Range Status   Specimen Description URINE, RANDOM  Final   Special Requests NONE  Final   Culture (A)  Final    >=100,000 COLONIES/mL ENTEROBACTER AEROGENES REPEATING SUSCEPTIBILITY Performed at [facility], [address] [place], [place] [address]    Report Status PENDING  Incomplete  SARS CORONAVIRUS 2 (TAT 6-24 HRS) Nasopharyngeal Nasopharyngeal Swab     Status: None   Collection Time: 09/28/19 12:23 PM   Specimen: Nasopharyngeal Swab  Result Value Ref Range Status   SARS Coronavirus 2 NEGATIVE NEGATIVE Final    Comment: (NOTE) SARS-CoV-2 target nucleic acids are NOT DETECTED. The SARS-CoV-2 RNA is generally detectable in upper and lower respiratory specimens during the acute phase of infection. Negative results do not preclude SARS-CoV-2 infection, do not rule out co-infections with other pathogens, and should not be used as the sole basis for treatment or other patient management decisions. Negative results must be combined with clinical observations, patient history, and epidemiological information. The expected result is Negative. Fact Sheet for Patients: SugarRoll.be Fact Sheet for  Healthcare Providers: https://www.woods-mathews.com/ This test is not yet approved or cleared by the [place] FDA and  has been authorized for detection and/or diagnosis of SARS-CoV-2 by FDA under an Emergency Use Authorization (EUA). This EUA will remain  in effect (meaning this test can be used) for the duration of the COVID-19 declaration under Section 56 4(b)(1) of the Act, 21 U.S.C. section 360bbb-3(b)(1), unless the authorization is terminated or revoked sooner. Performed at [facility], [place] [address] [place], [place] [address]       Radiology Studies: MR BRAIN WO CONTRAST  Result Date: 09/28/2019 CLINICAL DATA:  Altered mental status. Additional history obtained from [place]: MRI HEAD WITHOUT CONTRAST TECHNIQUE: Multiplanar, multiecho pulse sequences of the brain and surrounding structures were obtained without intravenous contrast. COMPARISON:  Noncontrast head CT performed earlier the same day 09/28/2019, brain MRI 09/16/2019 FINDINGS: Brain: The patient was unable to tolerate the full examination. As a result, the axial T1 weighted sequence is moderately motion degraded and a coronal T2 weighted sequence could not be obtained. There is no evidence of acute infarct. No evidence of intracranial mass. No midline shift or extra-axial fluid collection. No chronic intracranial blood products. There is a small focus of T2/FLAIR hyperintensity with T2 shine through in the subcortical left posterior frontal lobe at site of a known, now subacute, infarct. Moderate T2/FLAIR hyperintense signal changes within cerebral white matter are unchanged and consistent with chronic small vessel ischemic disease. Stable, moderate generalized parenchymal atrophy. Vascular: Flow voids maintained within the proximal large arterial vessels. Skull and upper cervical spine: No focal marrow lesion. Sinuses/Orbits: Visualized orbits demonstrate no acute  abnormality. Chronic deformity of the left lamina papyracea. No significant paranasal sinus disease or mastoid effusion. IMPRESSION: 1. Prematurely terminated examination as described. 2. No evidence of acute intracranial abnormality. 3. Expected evolution of a known small subacute infarct within the posterior left frontal lobe. 4. Stable moderate generalized parenchymal atrophy and chronic small vessel ischemic disease. Electronically Signed   By: Kellie Simmering DO   On: 09/28/2019 12:18   DG CHEST PORT 1 VIEW  Result Date: 09/28/2019 CLINICAL DATA:  Shortness of breath. EXAM: PORTABLE CHEST 1 VIEW COMPARISON:  September 16, 2019. FINDINGS: Stable cardiomegaly. No pneumothorax or pleural effusion is noted. Both lungs are clear. The visualized skeletal structures are unremarkable. IMPRESSION: No active disease. Electronically Signed   By: Marijo Conception M.D.   On: 09/28/2019 14:56     Scheduled Meds: . aspirin EC  81 mg Oral Q M,W,F  . atorvastatin  20 mg Oral Daily  . clopidogrel  75 mg Oral Daily  . docusate sodium  100 mg Oral BID  . enoxaparin (LOVENOX) injection  40 mg  Subcutaneous Q24H  . feeding supplement (GLUCERNA SHAKE)  237 mL Oral TID BM  . folic acid  1 mg Oral Daily  . insulin aspart  0-6 Units Subcutaneous TID WC  . metoCLOPramide  5 mg Oral TID AC  . metoprolol tartrate  25 mg Oral BID  . mometasone-formoterol  2 puff Inhalation BID  . sodium chloride flush  3 mL Intravenous Q12H  . cyanocobalamin  1,000 mcg Oral Daily   Continuous Infusions: . ciprofloxacin 400 mg (09/30/19 0013)     LOS: 2 days   Time Spent in minutes   45 minutes  Seville Brick D.O. on 09/30/2019 at 11:00 AM  Between 7am to 7pm - Please see pager noted on [email or web address]  After 7pm go to [email or web address]  And look for the night coverage person covering for me after hours  [facility] Office  [phone]

## 2019-09-30 NOTE — Progress Notes (Signed)
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 Presents after being found acutely altered.  Treated for UTI as outpatient (but primary care called in new antibiotic yesterday). Admitted for acute encephalopathy, anticipate from untreated UTI.     PT Comments    Pt with increased orientation compared to yesterday (aware she is in the hospital now), but remains very confused. Performing functional mobility at a min assist level. Ambulating 10 feet with a walker to the sink before requiring a seated rest break. HR 90's-110 bpm. Requiring min assist for ADL task of brushing teeth in seated position at sink. Per CM note, pt does not have 24/7 assist at home. Pt currently presents as high fall risk based on decreased gait speed, visual impairments, balance deficits, and decreased safety awareness. Updated d/c plan to SNF.    Follow Up Recommendations  Supervision/Assistance - 24 hour;SNF     Equipment Recommendations  None recommended by PT    Recommendations for Other Services       Precautions / Restrictions Precautions Precautions: Fall Precaution Comments: impaired vision Restrictions Weight Bearing Restrictions: No    Mobility  Bed Mobility Overal bed mobility: Needs Assistance Bed Mobility: Supine to Sit     Supine to sit: Supervision     General bed mobility comments: Progressing to edge of bed with supervision  Transfers Overall transfer level: Needs assistance Equipment used: Rolling walker (2 wheeled) Transfers: Sit to/from [facility] Sit to Stand: Min assist Stand pivot transfers: Min assist       General transfer comment: MinA to stand from edge of bed and recliner,  pt preferring to pull up from walker. Stand pivot with no AD and minA for guidance from recliner > BSC  Ambulation/Gait Ambulation/Gait assistance: Min assist;+2 safety/equipment Gait Distance (Feet): 10 Feet Assistive device: Rolling walker (2 wheeled) Gait Pattern/deviations: Step-through pattern;Decreased stride length;Trunk flexed Gait velocity: decreased   General Gait Details: MinA for stability, manual assist for maneuvering walker around obstacles. Pt requesting to sit once at sink   Marine scientist [place] (Stroke Patients Only)       Balance Overall balance assessment: Needs assistance Sitting-balance support: No upper extremity supported;Feet supported Sitting balance-Leahy Scale: Good     Standing balance support: Bilateral upper extremity supported;During functional activity Standing balance-Leahy Scale: Poor Standing balance comment: reliant on external support                            Cognition Arousal/Alertness: Awake/alert Behavior During Therapy: Flat affect Overall Cognitive Status: No family/caregiver present to determine baseline cognitive functioning Area of Impairment: Attention;Safety/judgement;Problem solving;Awareness;Orientation;Memory;Following commands                 Orientation Level: Disoriented to;Situation;Time Current Attention Level: Sustained Memory: Decreased short-term memory Following Commands: Follows one step commands inconsistently;Follows one step commands with increased time Safety/Judgement: Decreased awareness of safety;Decreased awareness of deficits Awareness: Intellectual Problem Solving: Difficulty sequencing;Requires verbal cues;Slow processing;Decreased initiation;Requires tactile cues General Comments: Pt oriented to self, place, but stating year was "2020;" able to correctly guess month with options. Pt with STM deficits, i.e.  PT asked pt multiple times if  she needed to use the bathroom and pt stating no, however, once sitting in chair, pt stating she had to have a bowel movement. Pt sat on BSC and then stating she needed to lie back down, despite lack of bowel movement.      Exercises      General Comments        Pertinent Vitals/Pain Pain Assessment: Faces Faces Pain Scale: No hurt    Home Living                      Prior Function            PT Goals (current goals can now be found in the care plan section) Acute Rehab PT Goals Patient Stated Goal: none stated  PT Goal Formulation: With patient Time For Goal Achievement: 10/13/19 Potential to Achieve Goals: Fair Progress towards PT goals: Progressing toward goals    Frequency    Min 2X/week      PT Plan Discharge plan needs to be updated;Frequency needs to be updated    Co-evaluation              AM-PAC PT "6 Clicks" Mobility   Outcome Measure  Help needed turning from your back to your side while in a flat bed without using bedrails?: None Help needed moving from lying on your back to sitting on the side of a flat bed without using bedrails?: A Little Help needed moving to and from a bed to a chair (including a wheelchair)?: A Little Help needed standing up from a chair using your arms (e.g., wheelchair or bedside chair)?: A Little Help needed to walk in hospital room?: A Little Help needed climbing 3-5 steps with a railing? : A Lot 6 Click Score: 18    End of Session Equipment Utilized During Treatment: Gait belt Activity Tolerance: Patient limited by fatigue Patient left: with call bell/phone within reach;in bed;with bed alarm set Nurse Communication: Mobility status PT Visit Diagnosis: Muscle weakness (generalized) (M62.81);Difficulty in walking, not elsewhere classified (R26.2);History of falling (Z91.81)     Time: [id number] PT Time Calculation (min) (ACUTE ONLY): 38 min  Charges:  $Therapeutic Activity: 38-52 mins                        Wyona Almas, PT, DPT Acute Rehabilitation Services Pager [phone] Office [phone]    Deno Etienne 09/30/2019, 4:46 PM

## 2019-10-01 ENCOUNTER — Ambulatory Visit: Payer: Self-pay | Admitting: *Deleted

## 2019-10-01 LAB — COMPREHENSIVE METABOLIC PANEL
ALT: 22 U/L (ref 0–44)
AST: 42 U/L — ABNORMAL HIGH (ref 15–41)
Albumin: 2.5 g/dL — ABNORMAL LOW (ref 3.5–5.0)
Alkaline Phosphatase: 43 U/L (ref 38–126)
Anion gap: 10 (ref 5–15)
BUN: 5 mg/dL — ABNORMAL LOW (ref 8–23)
CO2: 23 mmol/L (ref 22–32)
Calcium: 8.8 mg/dL — ABNORMAL LOW (ref 8.9–10.3)
Chloride: 99 mmol/L (ref 98–111)
Creatinine, Ser: 0.59 mg/dL (ref 0.44–1.00)
GFR calc Af Amer: >60 mL/min (ref >60)
GFR calc non Af Amer: >60 mL/min (ref >60)
Glucose, Bld: 131 mg/dL — ABNORMAL HIGH (ref 70–99)
Potassium: 3.7 mmol/L (ref 3.5–5.1)
Sodium: 132 mmol/L — ABNORMAL LOW (ref 135–145)
Total Bilirubin: 0.4 mg/dL (ref 0.3–1.2)
Total Protein: 5.1 g/dL — ABNORMAL LOW (ref 6.5–8.1)

## 2019-10-01 LAB — GLUCOSE, CAPILLARY
Glucose-Capillary: 137 mg/dL — ABNORMAL HIGH (ref 70–99)
Glucose-Capillary: 211 mg/dL — ABNORMAL HIGH (ref 70–99)
Glucose-Capillary: 85 mg/dL (ref 70–99)
Glucose-Capillary: 97 mg/dL (ref 70–99)

## 2019-10-01 LAB — SARS CORONAVIRUS 2 (TAT 6-24 HRS): SARS Coronavirus 2: NEGATIVE

## 2019-10-01 NOTE — TOC Initial Note (Signed)
Transition of Care [facility]) - Initial/Assessment Note    Patient Details  Name: [name] MRN: [id number] Date of Birth: September 05, 1935  [facility] CM/SW Contact:    Pollie Friar, RN Phone Number: 10/01/2019, 11:22 AM  Clinical Narrative:                 After multiple phone calls from family CM has spoken to patients spouse and he wants to be the main contact and decision maker. I have asked him to have conversations go through him and he relay the details. Spouse in agreement.  CM went over that pt is going to need 24 hour care/ supervision at home and he says he will see if family can come together for 24 hour supervision. CM also went over recommendations for SNF. He is good with patient being worked up for SNF rehab and being faxed out in the [place] area.  CM to start expedited aide forms for aide services once pt is able to get back home. Will fax in once pt is d/ced per [employer]. TOC following.  Expected Discharge Plan: Acute to Acute Transfer Barriers to Discharge: Continued Medical Work up   Patient Goals and CMS Choice   CMS Medicare.gov Compare Post Acute Care list provided to:: Patient Represenative (must comment) Choice offered to / list presented to : Spouse  Expected Discharge Plan and Services Expected Discharge Plan: Acute to Acute Transfer In-house Referral: Clinical Social Work Discharge Planning Services: CM Consult Post Acute Care Choice: [place] Living arrangements for the past 2 months: [place]                                      Prior Living Arrangements/Services Living arrangements for the past 2 months: Single Family Home Lives with:: Spouse Patient language and need for interpreter reviewed:: Yes Do you feel safe going back to the place where you live?: Yes      Need for Family Participation in Patient Care: Yes (Comment) Care giver support system in place?: No  (comment)(Per family she doesnt have 24 hour supervision)   Criminal Activity/Legal Involvement Pertinent to Current Situation/Hospitalization: No - Comment as needed  Activities of Daily Living Home Assistive Devices/Equipment: None ADL Screening (condition at time of admission) Patient's cognitive ability adequate to safely complete daily activities?: No Is the patient deaf or have difficulty hearing?: No Does the patient have difficulty seeing, even when wearing glasses/contacts?: No Does the patient have difficulty concentrating, remembering, or making decisions?: Yes Patient able to express need for assistance with ADLs?: No Does the patient have difficulty dressing or bathing?: Yes Independently performs ADLs?: No Communication: Needs assistance Is this a change from baseline?: Change from baseline, expected to last >3 days Dressing (OT): Needs assistance Is this a change from baseline?: Change from baseline, expected to last >3 days Grooming: Needs assistance Is this a change from baseline?: Change from baseline, expected to last >3 days Feeding: Needs assistance Is this a change from baseline?: Change from baseline, expected to last >3 days Bathing: Needs assistance Is this a change from baseline?: Change from baseline, expected to last >3 days Toileting: Needs assistance Is this a change from baseline?: Change from baseline, expected to last >3days In/Out Bed: Needs assistance Is this a change from baseline?: Change from baseline, expected to last >3 days Walks in Home: Needs assistance Is  this a change from baseline?: Change from baseline, expected to last >3 days Does the patient have difficulty walking or climbing stairs?: Yes Weakness of Legs: Both Weakness of Arms/Hands: Both  Permission Sought/Granted                  Emotional Assessment Appearance:: Appears stated age   Affect (typically observed): Other (comment)(some confusion) Orientation: : Oriented  to Self, Oriented to Place   Psych Involvement: No (comment)  Admission diagnosis:  SOB (shortness of breath) [R06.02] Acute encephalopathy [G93.40] Patient Active Problem List   Diagnosis Date Noted  . Acute encephalopathy 09/28/2019  . Hyponatremia 09/28/2019  . Normocytic anemia 09/28/2019  . Elevated AST (SGOT) 09/28/2019  . Protein-calorie malnutrition, severe 09/17/2019  . Acute ischemic stroke ([place]) 09/16/2019  . DNR (do not resuscitate) 08/02/2019  . Dehydration 08/01/2019  . Urinary tract infection symptoms 07/31/2019  . Diabetic gastroparesis ([place]) 07/31/2019  . Unexplained weight loss 07/12/2019  . Non-intractable vomiting   . Nausea, vomiting, and diarrhea 05/30/2019  . Hypokalemia 05/30/2019  . Hypomagnesemia 05/30/2019  . Gastritis 05/30/2019  . Elevated lipase 05/30/2019  . SIRS (systemic inflammatory response syndrome) ([place]) 05/30/2019  . History of COPD   . Other chronic pain 04/22/2019  . Dyspnea 08/10/2018  . Acute diastolic heart failure ([place]) 06/15/2018  . Essential hypertension 06/15/2018  . 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 Gastric mass   . Sepsis secondary to UTI ([place])   . Diabetes mellitus type 2, controlled ([place])   . AKI (acute kidney injury) ([place])   . Chronic diastolic CHF (congestive heart failure) ([place])   . Urinary tract infection due to Klebsiella species 01/09/2017  . Sepsis ([place]) 01/09/2017  . Constipation 01/09/2017  . COPD (chronic obstructive pulmonary disease) ([place]) 01/09/2017  . Multiple falls 06/14/2015  . Weight loss 06/14/2015  . Acute respiratory failure with hypoxia ([place]) 06/14/2015  . Orthostasis 06/14/2015  . Acute respiratory failure ([place]) 06/14/2015  . Elevated lactic acid level 01/22/2015  . 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 Chronic diastolic heart failure, NYHA class 1 ([place]) 01/22/2015  . Tachycardia with heart rate 100-120 beats per minute 04/08/2014  . DM (diabetes mellitus) ([place]) 04/08/2014  . Chest pain  04/07/2014  . Syncope 04/07/2014  . Hyperglycemia 12/11/2012  . Syncope and collapse 12/11/2012  . Dehydration, mild 12/11/2012  . Noncompliance 12/11/2012  . 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 Rheumatoid arthritis ([place]) 12/11/2012   PCP:  [name], PA-C Pharmacy:   [facility] [place], [place] - [address] [place] [address] Phone: [phone] Fax: [phone]     Social Determinants of Health (SDOH) Interventions    Readmission Risk Interventions Readmission Risk Prevention Plan 09/02/2019  Transportation Screening Complete  Medication Review ([place]) Complete  PCP or Specialist appointment within 3-5 days of discharge Complete  HRI or [place] Complete  SW Recovery Care/Counseling Consult Complete  [place] Patient Refused  Some recent data might be hidden

## 2019-10-01 NOTE — Progress Notes (Signed)
Occupational Therapy Treatment Patient Details Name: [name] MRN: [id number] DOB: [birth year] Today's Date: 10/01/2019    History of present illness Pt is a 84 y/o female with PMH of HTN, diastolic heart failure, rheumatoid arthritis on chronic immunosuppression, GERD, and recent CVA of the left posterior frontal lobe on 2/18. Presents after being found acutely altered.  Treated for UTI as outpatient (but primary care called in new antibiotic yesterday). Admitted for acute encephalopathy, anticipate from untreated UTI.    OT comments  Patient continues to make  progress towards goals in skilled OT session. Patient's session encompassed functional transfers and and ADLs. Pt not wanting to participate initially, however agreed to sit EOB and then requested to use bathroom. Pt dependent on cues for safety due to cognition and impaired balance (tactile and verbal) and often would get frustrated when therapist would ask pt to sit up or complete tasks. Pt minimally agreeable to sit up for lunch due to wet sheets however required max encouragement from spouse and therapist. Pt would continue to benefit from OT in order to address cognition and aid in overall functional independence. Will continue to follow acutely.    Follow Up Recommendations  Home health OT;Supervision/Assistance - 24 hour    Equipment Recommendations  3 in 1 bedside commode    Recommendations for Other Services      Precautions / Restrictions Precautions Precautions: Fall Precaution Comments: impaired vision Restrictions Weight Bearing Restrictions: No       Mobility Bed Mobility Overal bed mobility: Needs Assistance Bed Mobility: Supine to Sit     Supine to sit: Supervision     General bed mobility comments: Progressing to edge of bed with supervision  Transfers Overall transfer level: Needs assistance Equipment used: Rolling walker (2 wheeled) Transfers: Sit to/from [facility] Sit to  Stand: Min assist Stand pivot transfers: Min assist       General transfer comment: MinA to stand from edge of bed and recliner, pt preferring to pull up from walker. Pt required max cues for safety and sequencing    Balance Overall balance assessment: Needs assistance Sitting-balance support: No upper extremity supported;Feet supported Sitting balance-Leahy Scale: Good     Standing balance support: Bilateral upper extremity supported;During functional activity Standing balance-Leahy Scale: Poor Standing balance comment: reliant on external support                           ADL either performed or assessed with clinical judgement   ADL Overall ADL's : Needs assistance/impaired                         Toilet Transfer: Minimal assistance;Ambulation;RW;BSC;Cueing for sequencing Toilet Transfer Details (indicate cue type and reason): simulated to recliner and completed with [facility] Toileting- Clothing Manipulation and Hygiene: Total assistance;Cueing for safety;Cueing for sequencing Toileting - Clothing Manipulation Details (indicate cue type and reason): Total assist from [facility] due to decreased cognition     Functional mobility during ADLs: Minimal assistance;Rolling walker General ADL Comments: pt limited by impaired cognition, decreased activity tolerance and impaired balance      Vision       Perception     Praxis      Cognition Arousal/Alertness: Awake/alert Behavior During Therapy: Flat affect;Impulsive;Agitated Overall Cognitive Status: Impaired/Different from baseline Area of Impairment: Attention;Safety/judgement;Problem solving;Awareness;Orientation;Memory;Following commands                 Orientation Level: Disoriented  to;Situation Current Attention Level: Sustained Memory: Decreased short-term memory Following Commands: Follows one step commands inconsistently;Follows one step commands with increased time Safety/Judgement: Decreased  awareness of safety;Decreased awareness of deficits Awareness: Intellectual Problem Solving: Difficulty sequencing;Requires verbal cues;Slow processing;Decreased initiation;Requires tactile cues General Comments: Pt oriented except to situation, often yelling at therapist that she didnt want to get up but then used [place], was able to have pt sit in chair due to dirty sheets and encouragment from spouse        Exercises     Shoulder Instructions       General Comments      Pertinent Vitals/ Pain       Pain Assessment: No/denies pain Pain Location: Kept stating that her stomach was "going to make me sick" but didnt have pain Pain Descriptors / Indicators: Grimacing Pain Intervention(s): Limited activity within patient's tolerance;Monitored during session;RN gave pain meds during session;Repositioned;Relaxation  Home Living                                          Prior Functioning/Environment              Frequency  Min 2X/week        Progress Toward Goals  OT Goals(current goals can now be found in the care plan section)  Progress towards OT goals: Progressing toward goals  Acute Rehab OT Goals Patient Stated Goal: none stated  OT Goal Formulation: Patient unable to participate in goal setting Time For Goal Achievement: 10/13/19 Potential to Achieve Goals: Good  Plan Discharge plan remains appropriate    Co-evaluation                 AM-PAC OT "6 Clicks" Daily Activity     Outcome Measure   Help from another person eating meals?: A Little Help from another person taking care of personal grooming?: A Little Help from another person toileting, which includes using toliet, bedpan, or urinal?: A Lot Help from another person bathing (including washing, rinsing, drying)?: A Lot Help from another person to put on and taking off regular upper body clothing?: A Lot Help from another person to put on and taking off regular lower body clothing?:  Total 6 Click Score: 13    End of Session Equipment Utilized During Treatment: Gait belt;Rolling walker  OT Visit Diagnosis: Other abnormalities of gait and mobility (R26.89);Muscle weakness (generalized) (M62.81);Other symptoms and signs involving cognitive function;Cognitive communication deficit (R41.841)   Activity Tolerance Treatment limited secondary to agitation   Patient Left in chair;with call bell/phone within reach;with chair alarm set   Nurse Communication Mobility status        Time: [id number] OT Time Calculation (min): 20 min  Charges: OT General Charges $OT Visit: 1 Visit OT Treatments $Self Care/Home Management : 8-22 mins  Corinne Ports E. [place], [place] Acute Rehabilitation Services [phone] 10/01/2019, 1:19 PM

## 2019-10-01 NOTE — Care Management Important Message (Signed)
Important Message  Patient Details  Name: [name] MRN: [id number] Date of Birth: May 14, 1936   Medicare Important Message Given:  Yes   Due to illness patient was not able to sign. Copy let at the patient bedside.  Etienne Millward 10/01/2019, 11:00 AM

## 2019-10-01 NOTE — Progress Notes (Signed)
Pt has insurance authorization through her [place]:  Ref #: [id number] Authorization #: [id number]  Good for 5 days starting 10/01/2019-10/05/2019  Updates go to: Horace Porteous: [phone] Fax: [phone]

## 2019-10-01 NOTE — TOC Progression Note (Signed)
Transition of Care [facility]) - Progression Note    Patient Details  Name: Samon Ptacek MRN: [id number] Date of Birth: 08/06/1935  [facility] CM/SW Contact  Pollie Friar, RN Phone Number: 10/01/2019, 11:26 AM  Clinical Narrative:    CM has received phone call from daughter. CM updated her that the spouse will be the main decision maker and that without a POA he is the next of kin. She verbalized understanding. She wants to try and help figure out d/c plan.  Pts spouse in room with patient. CM spoke with him and he again states he wants to be the main person contacted. He was ok with the daughter knowing some information but decisions should go through him. CM asked that he update the family that he is the one that information will go to and he will update everyone else.  Spouse has decided on SNF rehab for Mrs Samuelsen. CM provided him the bed offers and he chose [facility]. CM has updated Tammy with [facility] and started insurance auth through [place] for [facility]. CM has also updated MD and asked for new Covid when she is ready for d/c.  TOC following.  Expected Discharge Plan: Acute to Acute Transfer Barriers to Discharge: Continued Medical Work up  Expected Discharge Plan and Services Expected Discharge Plan: Acute to Acute Transfer In-house Referral: Clinical Social Work Discharge Planning Services: CM Consult Post Acute Care Choice: [place] arrangements for the past 2 months: [place] Determinants of Health (SDOH) Interventions    Readmission Risk Interventions Readmission Risk Prevention Plan 09/02/2019  Transportation Screening Complete  Medication Review [employer]) Complete  PCP or Specialist appointment within 3-5 days of discharge Complete  HRI or [place] Complete  SW Recovery Care/Counseling Consult Complete  [place] Patient Refused  Some recent data might be hidden

## 2019-10-01 NOTE — Progress Notes (Addendum)
PROGRESS NOTE    [name]  [id number] DOB: 10/11/1935 DOA: 09/28/2019 PCP: [name], PA-C   Brief Narrative:  HPI on 09/28/2019 by Dr. Fuller Plan [name] is a 84 y.o. 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  She presents after being found to be acutely altered. Husband had called EMS this morning because she was disoriented and was incontinent of urine.   Patient had been being treated for a UTI as an outpatient on cephalexin, but review of records shows cultures from 2/24 were noted to be resistant.  The patient's primary care provider had called in a prescription for ciprofloxacin yesterday, but family had not been able to pick it up yet.  Since the patient had been discharged home from the hospital on 2/21, family reports that she had not been eating or drinking much.  Patient denies having any complaints of pain or shortness of breath.  Interim history Admitted for AMS. Found to have Enterobacter UTI, currently on ciprofloxacin. Assessment & Plan   Acute metabolic encephalopathy -possibly secondary to UTI vs failure to thrive and poor oral intake  -unknown baseline- have tried to reach out husband  -MRI brain: No evidence of acute intercranial normality.  Expected evolution of known small subacute infarct within the posterior left frontal lobe. -upon review of chart, patient has had confusion since previous admission  Acute Enterobacter aerogenes UTI -Urine culture from 10/17/2019 showed Enterobacter aerogenes -Urine culture from 09/22/2019 showed Klebsiella originates, resistance to cephalosporins -PCP had tried to start patient on ciprofloxacin however patient's family was unable to obtain medication -Currently on IV ciprofloxacin -pending susceptibilities   Essential  hypertension and tachycardia -BP stable  -Continue metoprolol  Acute hyponatremia -sodium down to 132 today -will continue to monitor   Hypokalemia -resolved with replacement  Failure to thrive/underweight -BMI of 17.37 -At home, patient reported not to be eating or drinking much -Suspect symptoms to be secondary to UTI -Continue supplements -started on IVF due to poor oral intake  Recent CVA -Patient suffered a stroke, left posterior frontal lobe on 09/16/2019 -Follow-up with neurology as an outpatient  -Continue Plavix and aspirin  Normocytic anemia -hemoglobin stable, continue to monitor  Diabetes mellitus, type II -hemoglobin A1c 6.2 on 09/17/2019 -Not on any diabetic medications at home, likely diet controlled -Continue insulin sliding scale with CBG monitoring  Hyperlipidemia -Recent lipid panel from 09/17/2019 showed total cholesterol 106, HDL 34, LDL 34, triglycerides 190 -Continue statin  Elevated AST -Acute on chronic -AST elevated to 42 (was higher in Jan 2021) -Continue to monitor   Deconditioning -PT recommended home health -Recommended home health, if patient's family unable to provide 24/7 assist may need SNF.  3 and 1 bedside commode.  DVT Prophylaxis Lovenox  Code Status: DNR  Family Communication: None at bedside.  Attempted to reach out to husband at all numbers listed.  Disposition Plan: Admitted from home for acute metabolic encephalopathy.  Patient found to have UTI, currently on IV antibiotics and pending susceptibilities. Continues to have confusion, but unknown baseline. Disposition TBD.  Consultants Neurology   Procedures  None  Antibiotics   Anti-infectives (From admission, onward)   Start     Dose/Rate Route Frequency Ordered Stop   09/28/19 2000  ciprofloxacin (CIPRO) IVPB 400 mg     400 mg 200 mL/hr over 60 Minutes Intravenous Every 12 hours 09/28/19 1141  09/28/19 0745  ciprofloxacin (CIPRO) IVPB 400 mg     400 mg 200  mL/hr over 60 Minutes Intravenous  Once 09/28/19 0741 09/28/19 0930      Subjective:   [name] seen and examined today.  Patient with no complaints. Wants to have her hair dresser called as she pulled out her hair.  Has no other complaints.   Objective:   Vitals:   09/30/19 2309 10/01/19 0411 10/01/19 0739 10/01/19 0746  BP: (!) 154/93 (!) 146/74 (!) 144/76   Pulse: (!) 120 94 97 96  Resp: 15 16 18 18   Temp: 98.8 F (37.1 C) 99.3 F (37.4 C) 98.8 F (37.1 C)   TempSrc: Oral Oral Oral   SpO2: 97% 98% 99% 98%  Weight:      Height:        Intake/Output Summary (Last 24 hours) at 10/01/2019 1004 Last data filed at 09/30/2019 1449 Gross per 24 hour  Intake 1291.67 ml  Output 2 ml  Net 1289.67 ml   Filed Weights   09/28/19 1723  Weight: 51.9 kg   Exam  General: Well developed, well nourished, NAD, appears stated age  51: NCAT, mucous membranes moist.   Cardiovascular: S1 S2 auscultated, RRR  Respiratory: Clear to auscultation bilaterally   Abdomen: Soft, nontender, nondistended, + bowel sounds  Extremities: warm dry without cyanosis clubbing or edema  Neuro: AAOx2 (self, place, not situation or time), nonfocal  Psych: Pleasantly confused, appropriate mood and affect  Data Reviewed: I have personally reviewed following labs and imaging studies  CBC: Recent Labs  Lab 09/28/19 0611 09/28/19 0615 09/29/19 0605  WBC 5.6  --  4.5  NEUTROABS 2.5  --   --   HGB 11.4* 12.2 11.1*  HCT 36.6 36.0 35.1*  MCV 93.4  --  91.9  PLT 263  --  [id number]   Basic Metabolic Panel: Recent Labs  Lab 09/28/19 0611 09/28/19 0615 09/29/19 0605 09/30/19 0212 10/01/19 0510  NA 132* 132* 138 137 132*  K 3.6 3.4* 3.1* 3.9 3.7  CL 99 97* 104 106 99  CO2 20*  --  25 22 23   GLUCOSE 188* 185* 120* 103* 131*  BUN 7* 7* <5* <5* 5*  CREATININE 0.78 0.60 0.63 0.61 0.59  CALCIUM 9.0  --  8.4* 8.5* 8.8*  MG  --   --  1.7  --   --    GFR: Estimated Creatinine Clearance: 45.2  mL/min (by C-G formula based on SCr of 0.59 mg/dL). Liver Function Tests: Recent Labs  Lab 09/28/19 0611 09/29/19 0605 10/01/19 0510  AST 43* 49* 42*  ALT 23 24 22   ALKPHOS 53 48 43  BILITOT 0.6 0.3 0.4  PROT 6.2* 5.8* 5.1*  ALBUMIN 3.0* 2.8* 2.5*   No results for input(s): LIPASE, AMYLASE in the last 168 hours. No results for input(s): AMMONIA in the last 168 hours. Coagulation Profile: Recent Labs  Lab 09/28/19 0611  INR 1.0   Cardiac Enzymes: No results for input(s): CKTOTAL, CKMB, CKMBINDEX, TROPONINI in the last 168 hours. BNP (last 3 results) No results for input(s): PROBNP in the last 8760 hours. HbA1C: No results for input(s): HGBA1C in the last 72 hours. CBG: Recent Labs  Lab 09/30/19 0624 09/30/19 1144 09/30/19 1552 09/30/19 2131 10/01/19 0640  GLUCAP 108* 127* 104* 151* 137*   Lipid Profile: No results for input(s): CHOL, HDL, LDLCALC, TRIG, CHOLHDL, LDLDIRECT in the last 72 hours. Thyroid Function Tests: No results for input(s): TSH, T4TOTAL, FREET4,  T3FREE, THYROIDAB in the last 72 hours. Anemia Panel: No results for input(s): VITAMINB12, FOLATE, FERRITIN, TIBC, IRON, RETICCTPCT in the last 72 hours. Urine analysis:    Component Value Date/Time   COLORURINE YELLOW 09/28/2019 0719   APPEARANCEUR HAZY (A) 09/28/2019 0719   LABSPEC 1.004 (L) 09/28/2019 0719   PHURINE 6.0 09/28/2019 0719   GLUCOSEU NEGATIVE 09/28/2019 0719   HGBUR NEGATIVE 09/28/2019 0719   BILIRUBINUR NEGATIVE 09/28/2019 0719   BILIRUBINUR negative 09/22/2019 1541   KETONESUR NEGATIVE 09/28/2019 0719   PROTEINUR NEGATIVE 09/28/2019 0719   UROBILINOGEN 0.2 09/22/2019 1541   UROBILINOGEN 2.0 (H) 08/13/2019 1542   NITRITE NEGATIVE 09/28/2019 0719   LEUKOCYTESUR LARGE (A) 09/28/2019 0719   Sepsis Labs: @LABRCNTIP (procalcitonin:4,lacticidven:4)  ) Recent Results (from the past 240 hour(s))  Culture, Urine     Status: Abnormal   Collection Time: 09/22/19  4:20 PM   Specimen:  Urine   URINE  Result Value Ref Range Status   Urine Culture, Routine Final report (A)  Final   Organism ID, Bacteria Klebsiella aerogenes (A)  Final    Comment: Greater than 100,000 colony forming units per mL formerly Enterobacter aerogenes    Antimicrobial Susceptibility Comment  Final    Comment:       ** S = Susceptible; I = Intermediate; R = Resistant **                    P = Positive; N = Negative             MICS are expressed in micrograms per mL    Antibiotic                 RSLT#1    RSLT#2    RSLT#3    RSLT#4 Amoxicillin/Clavulanic Acid    R Cefazolin                      R Cefepime                       S Ceftriaxone                    R Cefuroxime                     R Ciprofloxacin                  S Gentamicin                     S Imipenem                       S Levofloxacin                   S Meropenem                      S Nitrofurantoin                 R Tetracycline                   S Tobramycin                     S Trimethoprim/Sulfa             S   Urine culture     Status: Abnormal (Preliminary result)   Collection Time:  09/28/19  7:19 AM   Specimen: Urine, Random  Result Value Ref Range Status   Specimen Description URINE, RANDOM  Final   Special Requests NONE  Final   Culture (A)  Final    >=100,000 COLONIES/mL ENTEROBACTER AEROGENES REPEATING SUSCEPTIBILITY Performed at [facility], [address], [place], [place] [address]    Report Status PENDING  Incomplete  SARS CORONAVIRUS 2 (TAT 6-24 HRS) Nasopharyngeal Nasopharyngeal Swab     Status: None   Collection Time: 09/28/19 12:23 PM   Specimen: Nasopharyngeal Swab  Result Value Ref Range Status   SARS Coronavirus 2 NEGATIVE NEGATIVE Final    Comment: (NOTE) SARS-CoV-2 target nucleic acids are NOT DETECTED. The SARS-CoV-2 RNA is generally detectable in upper and lower respiratory specimens during the acute phase of infection. Negative results do not preclude SARS-CoV-2 infection,  do not rule out co-infections with other pathogens, and should not be used as the sole basis for treatment or other patient management decisions. Negative results must be combined with clinical observations, patient history, and epidemiological information. The expected result is Negative. Fact Sheet for Patients: SugarRoll[employer]be Fact Sheet for Healthcare Providers: https://www.woods-mathews.com/ This test is not yet approved or cleared by the [place] FDA and  has been authorized for detection and/or diagnosis of SARS-CoV-2 by FDA under an Emergency Use Authorization (EUA). This EUA will remain  in effect (meaning this test can be used) for the duration of the COVID-19 declaration under Section 56 4(b)(1) of the Act, 21 U.S.C. section 360bbb-3(b)(1), unless the authorization is terminated or revoked sooner. Performed at [facility], [place] [address], [place], [place] [address]       Radiology Studies: No results found.   Scheduled Meds: . aspirin EC  81 mg Oral Q M,W,F  . atorvastatin  20 mg Oral Daily  . clopidogrel  75 mg Oral Daily  . docusate sodium  100 mg Oral BID  . enoxaparin (LOVENOX) injection  40 mg Subcutaneous Q24H  . feeding supplement (GLUCERNA SHAKE)  237 mL Oral TID BM  . folic acid  1 mg Oral Daily  . insulin aspart  0-6 Units Subcutaneous TID WC  . metoCLOPramide  5 mg Oral TID AC  . metoprolol tartrate  25 mg Oral BID  . mometasone-formoterol  2 puff Inhalation BID  . sodium chloride flush  3 mL Intravenous Q12H  . cyanocobalamin  1,000 mcg Oral Daily   Continuous Infusions: . sodium chloride 50 mL/hr at 09/30/19 1200  . ciprofloxacin 400 mg (09/30/19 2344)     LOS: 3 days   Time Spent in minutes   45 minutes  Emilene Roma D.O. on 10/01/2019 at 10:04 AM  Between 7am to 7pm - Please see pager noted on [email or web address]  After 7pm go to [email or web address]  And look for the night coverage person covering for  me after hours  [facility] Office  [phone]

## 2019-10-02 ENCOUNTER — Inpatient Hospital Stay (HOSPITAL_COMMUNITY): Payer: Medicare Other

## 2019-10-02 LAB — BASIC METABOLIC PANEL
Anion gap: 12 (ref 5–15)
BUN: <5 mg/dL — ABNORMAL LOW (ref 8–23)
CO2: 22 mmol/L (ref 22–32)
Calcium: 8.9 mg/dL (ref 8.9–10.3)
Chloride: 99 mmol/L (ref 98–111)
Creatinine, Ser: 0.9 mg/dL (ref 0.44–1.00)
GFR calc Af Amer: >60 mL/min (ref >60)
GFR calc non Af Amer: 60 mL/min — ABNORMAL LOW (ref >60)
Glucose, Bld: 104 mg/dL — ABNORMAL HIGH (ref 70–99)
Potassium: 3.2 mmol/L — ABNORMAL LOW (ref 3.5–5.1)
Sodium: 133 mmol/L — ABNORMAL LOW (ref 135–145)

## 2019-10-02 LAB — GLUCOSE, CAPILLARY
Glucose-Capillary: 95 mg/dL (ref 70–99)
Glucose-Capillary: 119 mg/dL — ABNORMAL HIGH (ref 70–99)
Glucose-Capillary: 141 mg/dL — ABNORMAL HIGH (ref 70–99)
Glucose-Capillary: 90 mg/dL (ref 70–99)

## 2019-10-02 LAB — URINE CULTURE: Culture: >=100000 — AB

## 2019-10-02 LAB — CBC
HCT: 33.4 % — ABNORMAL LOW (ref 36.0–46.0)
Hemoglobin: 10.7 g/dL — ABNORMAL LOW (ref 12.0–15.0)
MCH: 29.3 pg (ref 26.0–34.0)
MCHC: 32 g/dL (ref 30.0–36.0)
MCV: 91.5 fL (ref 80.0–100.0)
Platelets: 231 10*3/uL (ref 150–400)
RBC: 3.65 MIL/uL — ABNORMAL LOW (ref 3.87–5.11)
RDW: 14.8 % (ref 11.5–15.5)
WBC: 5.1 10*3/uL (ref 4.0–10.5)
nRBC: 0 % (ref 0.0–0.2)

## 2019-10-02 LAB — CARBAPENEM RESISTANCE PANEL
Carba Resistance IMP Gene: NOT DETECTED
Carba Resistance KPC Gene: NOT DETECTED
Carba Resistance NDM Gene: NOT DETECTED
Carba Resistance OXA48 Gene: NOT DETECTED
Carba Resistance VIM Gene: NOT DETECTED

## 2019-10-02 MED ORDER — LORAZEPAM 0.5 MG PO TABS
0.5000 mg | ORAL_TABLET | Freq: Once | ORAL | Status: AC
  Administered 2019-10-02: 0.5 mg via ORAL
  Filled 2019-10-02: qty 1

## 2019-10-02 MED ORDER — POTASSIUM CHLORIDE CRYS ER 20 MEQ PO TBCR
40.0000 meq | EXTENDED_RELEASE_TABLET | Freq: Once | ORAL | Status: AC
  Administered 2019-10-02: 40 meq via ORAL
  Filled 2019-10-02: qty 2

## 2019-10-02 MED ORDER — METOPROLOL TARTRATE 50 MG PO TABS
50.0000 mg | ORAL_TABLET | Freq: Two times a day (BID) | ORAL | Status: DC
  Administered 2019-10-02 – 2019-10-05 (×7): 50 mg via ORAL
  Filled 2019-10-02 (×7): qty 1

## 2019-10-02 NOTE — Plan of Care (Signed)

## 2019-10-02 NOTE — Progress Notes (Signed)
PROGRESS NOTE    [name]  [id number] DOB: 07-08-1936 DOA: 09/28/2019 PCP: [name], PA-C   Brief Narrative:  HPI on 09/28/2019 by Dr. Fuller Plan Prisma Brom is a 84 y.o. 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  She presents after being found to be acutely altered. Husband had called EMS this morning because she was disoriented and was incontinent of urine.   Patient had been being treated for a UTI as an outpatient on cephalexin, but review of records shows cultures from 2/24 were noted to be resistant.  The patient's primary care provider had called in a prescription for ciprofloxacin yesterday, but family had not been able to pick it up yet.  Since the patient had been discharged home from the hospital on 2/21, family reports that she had not been eating or drinking much.  Patient denies having any complaints of pain or shortness of breath.  Interim history Admitted for AMS. Found to have Enterobacter UTI, currently on ciprofloxacin and the days Assessment & Plan   Acute metabolic encephalopathy -possibly secondary to UTI vs failure to thrive and poor oral intake  -unknown baseline- have tried to reach out husband  -MRI brain: No evidence of acute intercranial normality.  Expected evolution of known small subacute infarct within the posterior left frontal lobe. -upon review of chart, patient has had confusion since previous admission  Acute Enterobacter aerogenes UTI -Urine culture from 10/17/2019 showed Enterobacter aerogenes -Urine culture from 09/22/2019 showed Klebsiella originates, resistance to cephalosporins -PCP had tried to start patient on ciprofloxacin however patient's family was unable to obtain medication -Currently on IV ciprofloxacin -pending susceptibilities  -Patient  with suprapubic pain, obtained x-ray which showed no acute abdominal abnormality.  Possible pelvic floor laxity.  Essential hypertension and tachycardia -BP stable  -Continue metoprolol  Acute hyponatremia -sodium down to 133 today -will continue to monitor   Hypokalemia -resolved with replacement  Failure to thrive/underweight -BMI of 17.37 -At home, patient reported not to be eating or drinking much -Suspect symptoms to be secondary to UTI -Continue supplements -Continue on IVF due to poor oral intake  Recent CVA -Patient suffered a stroke, left posterior frontal lobe on 09/16/2019 -Follow-up with neurology as an outpatient  -Continue Plavix and aspirin  Normocytic anemia -hemoglobin stable, continue to monitor  Diabetes mellitus, type II -hemoglobin A1c 6.2 on 09/17/2019 -Not on any diabetic medications at home, likely diet controlled -Continue insulin sliding scale with CBG monitoring  Hyperlipidemia -Recent lipid panel from 09/17/2019 showed total cholesterol 106, HDL 34, LDL 34, triglycerides 190 -Continue statin  Elevated AST -Acute on chronic -AST elevated to 42 (was higher in Jan 2021) -Continue to monitor   Deconditioning -PT recommended home health -Recommended home health, if patient's family unable to provide 24/7 assist may need SNF.  3 and 1 bedside commode.  DVT Prophylaxis Lovenox  Code Status: DNR  Family Communication: None at bedside.  Attempted to reach out to husband at all numbers listed.  Disposition Plan: Admitted from home for acute metabolic encephalopathy.  Patient found to have UTI, currently on IV antibiotics and pending susceptibilities. Continues to have confusion, but unknown baseline. Disposition SNF.  Consultants Neurology   Procedures  None  Antibiotics   Anti-infectives (From admission, onward)   Start     Dose/Rate Route Frequency Ordered Stop   09/28/19 2000  ciprofloxacin (CIPRO) IVPB 400  mg     400 mg 200 mL/hr  over 60 Minutes Intravenous Every 12 hours 09/28/19 1141     09/28/19 0745  ciprofloxacin (CIPRO) IVPB 400 mg     400 mg 200 mL/hr over 60 Minutes Intravenous  Once 09/28/19 0741 09/28/19 0930      Subjective:   [name] seen and examined today.  Planes of lower abdominal pain and nausea.  States she does not have much of an appetite.  Denies current chest pain, shortness of breath, dizziness or headache.  Objective:   Vitals:   10/02/19 0322 10/02/19 0811 10/02/19 0814 10/02/19 1214  BP: (!) 141/74 134/81  134/81  Pulse: (!) 110 (!) 114 (!) 108 91  Resp:  18  18  Temp: 98.1 F (36.7 C) 98.4 F (36.9 C)  99.3 F (37.4 C)  TempSrc: Oral Oral  Oral  SpO2: 96% 94% 93% 96%  Weight:      Height:        Intake/Output Summary (Last 24 hours) at 10/02/2019 1538 Last data filed at 10/01/2019 2332 Gross per 24 hour  Intake -  Output 900 ml  Net -900 ml   Filed Weights   09/28/19 1723  Weight: 51.9 kg   Exam  General: Well developed, chronically ill-appearing, NAD, appears stated age  65: NCAT, mucous membranes moist.   Cardiovascular: S1 S2 auscultated, RRR  Respiratory: Clear to auscultation bilaterally   Abdomen: Soft, suprapubic tenderness, nondistended, + bowel sounds  Extremities: warm dry without cyanosis clubbing or edema  Neuro: AAOx2 (self, place, not situation or time), nonfocal  Psych: Pleasantly confused, appropriate mood and affect  Data Reviewed: I have personally reviewed following labs and imaging studies  CBC: Recent Labs  Lab 09/28/19 0611 09/28/19 0615 09/29/19 0605 10/02/19 0443  WBC 5.6  --  4.5 5.1  NEUTROABS 2.5  --   --   --   HGB 11.4* 12.2 11.1* 10.7*  HCT 36.6 36.0 35.1* 33.4*  MCV 93.4  --  91.9 91.5  PLT 263  --  279 [id number]   Basic Metabolic Panel: Recent Labs  Lab 09/28/19 0611 09/28/19 0611 09/28/19 0615 09/29/19 0605 09/30/19 0212 10/01/19 0510 10/02/19 0443  NA 132*   < > 132* 138 137 132* 133*  K 3.6   < >  3.4* 3.1* 3.9 3.7 3.2*  CL 99   < > 97* 104 106 99 99  CO2 20*  --   --  25 22 23 22   GLUCOSE 188*   < > 185* 120* 103* 131* 104*  BUN 7*   < > 7* <5* <5* 5* <5*  CREATININE 0.78   < > 0.60 0.63 0.61 0.59 0.90  CALCIUM 9.0  --   --  8.4* 8.5* 8.8* 8.9  MG  --   --   --  1.7  --   --   --    < > = values in this interval not displayed.   GFR: Estimated Creatinine Clearance: 40.2 mL/min (by C-G formula based on SCr of 0.9 mg/dL). Liver Function Tests: Recent Labs  Lab 09/28/19 0611 09/29/19 0605 10/01/19 0510  AST 43* 49* 42*  ALT 23 24 22   ALKPHOS 53 48 43  BILITOT 0.6 0.3 0.4  PROT 6.2* 5.8* 5.1*  ALBUMIN 3.0* 2.8* 2.5*   No results for input(s): LIPASE, AMYLASE in the last 168 hours. No results for input(s): AMMONIA in the last 168 hours. Coagulation Profile: Recent Labs  Lab 09/28/19 [id number]  INR 1.0   Cardiac Enzymes: No results for input(s): CKTOTAL, CKMB, CKMBINDEX, TROPONINI in the last 168 hours. BNP (last 3 results) No results for input(s): PROBNP in the last 8760 hours. HbA1C: No results for input(s): HGBA1C in the last 72 hours. CBG: Recent Labs  Lab 10/01/19 1210 10/01/19 1542 10/01/19 2108 10/02/19 0608 10/02/19 1213  GLUCAP 211* 85 97 95 119*   Lipid Profile: No results for input(s): CHOL, HDL, LDLCALC, TRIG, CHOLHDL, LDLDIRECT in the last 72 hours. Thyroid Function Tests: No results for input(s): TSH, T4TOTAL, FREET4, T3FREE, THYROIDAB in the last 72 hours. Anemia Panel: No results for input(s): VITAMINB12, FOLATE, FERRITIN, TIBC, IRON, RETICCTPCT in the last 72 hours. Urine analysis:    Component Value Date/Time   COLORURINE YELLOW 09/28/2019 0719   APPEARANCEUR HAZY (A) 09/28/2019 0719   LABSPEC 1.004 (L) 09/28/2019 0719   PHURINE 6.0 09/28/2019 0719   GLUCOSEU NEGATIVE 09/28/2019 0719   HGBUR NEGATIVE 09/28/2019 0719   BILIRUBINUR NEGATIVE 09/28/2019 0719   BILIRUBINUR negative 09/22/2019 1541   KETONESUR NEGATIVE 09/28/2019 0719    PROTEINUR NEGATIVE 09/28/2019 0719   UROBILINOGEN 0.2 09/22/2019 1541   UROBILINOGEN 2.0 (H) 08/13/2019 1542   NITRITE NEGATIVE 09/28/2019 0719   LEUKOCYTESUR LARGE (A) 09/28/2019 0719   Sepsis Labs: @LABRCNTIP (procalcitonin:4,lacticidven:4)  ) Recent Results (from the past 240 hour(s))  Culture, Urine     Status: Abnormal   Collection Time: 09/22/19  4:20 PM   Specimen: Urine   URINE  Result Value Ref Range Status   Urine Culture, Routine Final report (A)  Final   Organism ID, Bacteria Klebsiella aerogenes (A)  Final    Comment: Greater than 100,000 colony forming units per mL formerly Enterobacter aerogenes    Antimicrobial Susceptibility Comment  Final    Comment:       ** S = Susceptible; I = Intermediate; R = Resistant **                    P = Positive; N = Negative             MICS are expressed in micrograms per mL    Antibiotic                 RSLT#1    RSLT#2    RSLT#3    RSLT#4 Amoxicillin/Clavulanic Acid    R Cefazolin                      R Cefepime                       S Ceftriaxone                    R Cefuroxime                     R Ciprofloxacin                  S Gentamicin                     S Imipenem                       S Levofloxacin                   S Meropenem  S Nitrofurantoin                 R Tetracycline                   S Tobramycin                     S Trimethoprim/Sulfa             S   Urine culture     Status: Abnormal (Preliminary result)   Collection Time: 09/28/19  7:19 AM   Specimen: Urine, Random  Result Value Ref Range Status   Specimen Description URINE, RANDOM  Final   Special Requests NONE  Final   Culture (A)  Final    >=100,000 COLONIES/mL ENTEROBACTER AEROGENES REPEATING SUSCEPTIBILITY Performed at [facility], [address] [place], [place] [address]    Report Status PENDING  Incomplete  SARS CORONAVIRUS 2 (TAT 6-24 HRS) Nasopharyngeal Nasopharyngeal Swab     Status: None    Collection Time: 09/28/19 12:23 PM   Specimen: Nasopharyngeal Swab  Result Value Ref Range Status   SARS Coronavirus 2 NEGATIVE NEGATIVE Final    Comment: (NOTE) SARS-CoV-2 target nucleic acids are NOT DETECTED. The SARS-CoV-2 RNA is generally detectable in upper and lower respiratory specimens during the acute phase of infection. Negative results do not preclude SARS-CoV-2 infection, do not rule out co-infections with other pathogens, and should not be used as the sole basis for treatment or other patient management decisions. Negative results must be combined with clinical observations, patient history, and epidemiological information. The expected result is Negative. Fact Sheet for Patients: SugarRoll.be Fact Sheet for Healthcare Providers: https://www.woods-mathews.com/ This test is not yet approved or cleared by the [place] FDA and  has been authorized for detection and/or diagnosis of SARS-CoV-2 by FDA under an Emergency Use Authorization (EUA). This EUA will remain  in effect (meaning this test can be used) for the duration of the COVID-19 declaration under Section 56 4(b)(1) of the Act, 21 U.S.C. section 360bbb-3(b)(1), unless the authorization is terminated or revoked sooner. Performed at [facility], [place] [address], [place], [place] [address]   SARS CORONAVIRUS 2 (TAT 6-24 HRS) Nasopharyngeal Nasopharyngeal Swab     Status: None   Collection Time: 10/01/19  2:11 PM   Specimen: Nasopharyngeal Swab  Result Value Ref Range Status   SARS Coronavirus 2 NEGATIVE NEGATIVE Final    Comment: (NOTE) SARS-CoV-2 target nucleic acids are NOT DETECTED. The SARS-CoV-2 RNA is generally detectable in upper and lower respiratory specimens during the acute phase of infection. Negative results do not preclude SARS-CoV-2 infection, do not rule out co-infections with other pathogens, and should not be used as the sole basis for treatment  or other patient management decisions. Negative results must be combined with clinical observations, patient history, and epidemiological information. The expected result is Negative. Fact Sheet for Patients: SugarRoll.be Fact Sheet for Healthcare Providers: https://www.woods-mathews.com/ This test is not yet approved or cleared by the [place] FDA and  has been authorized for detection and/or diagnosis of SARS-CoV-2 by FDA under an Emergency Use Authorization (EUA). This EUA will remain  in effect (meaning this test can be used) for the duration of the COVID-19 declaration under Section 56 4(b)(1) of the Act, 21 U.S.C. section 360bbb-3(b)(1), unless the authorization is terminated or revoked sooner. Performed at [facility], [place] [address] [place], [place] [address]       Radiology  Studies: DG Abd 1 View  Result Date: 10/02/2019 CLINICAL DATA:  84 year old with acute generalized abdominal pain and nausea/vomiting that began last night. EXAM: ABDOMEN - 1 VIEW COMPARISON:  Abdomen x-ray 05/15/2017. CT abdomen and pelvis 09/01/2019. FINDINGS: Bowel gas pattern unremarkable without evidence of obstruction or significant ileus. Expected stool burden in the colon. The rectal gas is low-lying in the pelvis, raising the question pelvic floor laxity. No visible opaque urinary tract calculi. Phleboliths in the pelvis. Injection granulomata in the buttocks. Aorto-iliofemoral atherosclerosis. IMPRESSION: 1. No acute abdominal abnormality. Possible pelvic floor laxity. 2. Aorto-iliofemoral atherosclerosis. Electronically Signed   By: Evangeline Dakin M.D.   On: 10/02/2019 10:55     Scheduled Meds: . aspirin EC  81 mg Oral Q M,W,F  . atorvastatin  20 mg Oral Daily  . clopidogrel  75 mg Oral Daily  . docusate sodium  100 mg Oral BID  . enoxaparin (LOVENOX) injection  40 mg Subcutaneous Q24H  . feeding supplement (GLUCERNA SHAKE)  237 mL Oral TID  BM  . folic acid  1 mg Oral Daily  . insulin aspart  0-6 Units Subcutaneous TID WC  . metoCLOPramide  5 mg Oral TID AC  . metoprolol tartrate  50 mg Oral BID  . mometasone-formoterol  2 puff Inhalation BID  . sodium chloride flush  3 mL Intravenous Q12H  . cyanocobalamin  1,000 mcg Oral Daily   Continuous Infusions: . sodium chloride 50 mL/hr at 09/30/19 1200  . ciprofloxacin 400 mg (10/02/19 1204)     LOS: 4 days   Time Spent in minutes   45 minutes  Tamula Morrical D.O. on 10/02/2019 at 3:38 PM  Between 7am to 7pm - Please see pager noted on [email or web address]  After 7pm go to [email or web address]  And look for the night coverage person covering for me after hours  [facility] Office  [phone]

## 2019-10-02 NOTE — TOC Progression Note (Signed)
Transition of Care [facility]) - Progression Note    Patient Details  Name: [name] MRN: [id number] Date of Birth: Aug 11, 1935  Transition of Care [facility] CM/SW [place], [place] Phone Number: 10/02/2019, 11:43 AM  Clinical Narrative:    CSW spoke with MD for an update on medical readiness. CSW informed patient will likely be medically cleared this weekend. CSW spoke with Tammy of [facility] and confirmed patient can discharge to facility once medically ready. CSW will continue to follow and assist with discharge planning needs.    Expected Discharge Plan: Acute to Acute Transfer Barriers to Discharge: Continued Medical Work up  Expected Discharge Plan and Services Expected Discharge Plan: Acute to Acute Transfer In-house Referral: Clinical Social Work Discharge Planning Services: CM Consult Post Acute Care Choice: [place] arrangements for the past 2 months: [place] Determinants of Health (SDOH) Interventions    Readmission Risk Interventions Readmission Risk Prevention Plan 09/02/2019  Transportation Screening Complete  Medication Review Press photographer) Complete  PCP or Specialist appointment within 3-5 days of discharge Complete  HRI or [place] Complete  SW Recovery Care/Counseling Consult Complete  [place] Patient Refused  Some recent data might be hidden

## 2019-10-03 ENCOUNTER — Inpatient Hospital Stay (HOSPITAL_COMMUNITY): Payer: Medicare Other

## 2019-10-03 LAB — BASIC METABOLIC PANEL
Anion gap: 8 (ref 5–15)
BUN: <5 mg/dL — ABNORMAL LOW (ref 8–23)
CO2: 21 mmol/L — ABNORMAL LOW (ref 22–32)
Calcium: 8.4 mg/dL — ABNORMAL LOW (ref 8.9–10.3)
Chloride: 102 mmol/L (ref 98–111)
Creatinine, Ser: 0.97 mg/dL (ref 0.44–1.00)
GFR calc Af Amer: >60 mL/min (ref >60)
GFR calc non Af Amer: 55 mL/min — ABNORMAL LOW (ref >60)
Glucose, Bld: 108 mg/dL — ABNORMAL HIGH (ref 70–99)
Potassium: 4.2 mmol/L (ref 3.5–5.1)
Sodium: 131 mmol/L — ABNORMAL LOW (ref 135–145)

## 2019-10-03 LAB — GLUCOSE, CAPILLARY
Glucose-Capillary: 98 mg/dL (ref 70–99)
Glucose-Capillary: 107 mg/dL — ABNORMAL HIGH (ref 70–99)
Glucose-Capillary: 86 mg/dL (ref 70–99)
Glucose-Capillary: 89 mg/dL (ref 70–99)

## 2019-10-03 LAB — MAGNESIUM: Magnesium: 1.2 mg/dL — ABNORMAL LOW (ref 1.7–2.4)

## 2019-10-03 MED ORDER — IOHEXOL 300 MG/ML  SOLN
100.0000 mL | Freq: Once | INTRAMUSCULAR | Status: AC | PRN
  Administered 2019-10-03: 100 mL via INTRAVENOUS

## 2019-10-03 MED ORDER — MAGNESIUM SULFATE 4 GM/100ML IV SOLN
4.0000 g | Freq: Once | INTRAVENOUS | Status: AC
  Administered 2019-10-03: 4 g via INTRAVENOUS
  Filled 2019-10-03: qty 100

## 2019-10-03 MED ORDER — MIRTAZAPINE 15 MG PO TBDP
7.5000 mg | ORAL_TABLET | Freq: Every day | ORAL | Status: DC
  Administered 2019-10-03 – 2019-10-04 (×2): 7.5 mg via ORAL
  Filled 2019-10-03 (×3): qty 0.5

## 2019-10-03 MED ORDER — LORAZEPAM 0.5 MG PO TABS
0.5000 mg | ORAL_TABLET | Freq: Four times a day (QID) | ORAL | Status: DC | PRN
  Administered 2019-10-03 (×2): 0.5 mg via ORAL
  Filled 2019-10-03 (×2): qty 1

## 2019-10-03 NOTE — Progress Notes (Addendum)
PROGRESS NOTE    [name]  [id number] DOB: [birth year] DOA: 09/28/2019 PCP: [name], PA-C   Brief Narrative:  HPI on 09/28/2019 by Dr. Fuller Plan Taleea Spencer is a 84 y.o. 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  She presents after being found to be acutely altered. Husband had called EMS this morning because she was disoriented and was incontinent of urine.   Patient had been being treated for a UTI as an outpatient on cephalexin, but review of records shows cultures from 2/24 were noted to be resistant.  The patient's primary care provider had called in a prescription for ciprofloxacin yesterday, but family had not been able to pick it up yet.  Since the patient had been discharged home from the hospital on 2/21, family reports that she had not been eating or drinking much.  Patient denies having any complaints of pain or shortness of breath.  Interim history Admitted for AMS. Found to have Enterobacter UTI, currently on ciprofloxacin and the days.  Assessment & Plan   Acute metabolic encephalopathy -possibly secondary to UTI vs failure to thrive and poor oral intake  -unknown baseline- have tried to reach out husband  -MRI brain: No evidence of acute intercranial normality.  Expected evolution of known small subacute infarct within the posterior left frontal lobe. -upon review of chart, patient has had confusion since previous admission -Appears to be calmer this morning  Acute Enterobacter aerogenes UTI -Urine culture from 10/17/2019 showed Enterobacter aerogenes -Urine culture from 09/22/2019 showed Klebsiella originates, resistance to cephalosporins -PCP had tried to start patient on ciprofloxacin however patient's family was unable to obtain medication -Currently on IV  ciprofloxacin -Urine culture shows multidrug resistant organism, however sensitive to ciprofloxacin -Patient with suprapubic pain, obtained x-ray which showed no acute abdominal abnormality.  Possible pelvic floor laxity. -Patient continues to complain of pelvic/groin pain -Obtain CT abdomen pelvis which showed normal appearance of bilateral kidneys and renal collecting system.  No urinary obstruction or pyelonephritis.  Essential hypertension and tachycardia -BP stable  -Continue metoprolol  Acute hyponatremia -sodium down to 131 today -will continue to monitor   Hypokalemia -resolved with replacement  Hypomagnesemia -Will replace and monitor   Failure to thrive/underweight -BMI of 17.37 -At home, patient reported not to be eating or drinking much -Suspect symptoms to be secondary to UTI -Continue supplements -Continue on IVF due to poor oral intake -Have added low-dose Remeron  Recent CVA -Patient suffered a stroke, left posterior frontal lobe on 09/16/2019 -Follow-up with neurology as an outpatient  -Continue Plavix and aspirin  Normocytic anemia -hemoglobin stable, continue to monitor  Diabetes mellitus, type II -hemoglobin A1c 6.2 on 09/17/2019 -Not on any diabetic medications at home, likely diet controlled -Continue insulin sliding scale with CBG monitoring  Hyperlipidemia -Recent lipid panel from 09/17/2019 showed total cholesterol 106, HDL 34, LDL 34, triglycerides 190 -Continue statin  Elevated AST -Acute on chronic -AST elevated to 42 (was higher in Jan 2021) -Continue to monitor   Deconditioning -PT recommended home health -Recommended home health, if patient's family unable to provide 24/7 assist may need SNF.  3 and 1 bedside commode.  Anxiety -Started patient on low-dose Ativan which seems to have helped  DVT Prophylaxis Lovenox  Code Status: DNR  Family Communication: None at bedside.  Attempted to reach out to husband at all numbers  listed.  Disposition Plan:  Admitted from home for acute metabolic encephalopathy.  Patient found to have UTI, currently on IV antibiotics. Continues to have confusion and agitation, but unknown baseline. Continues to have poor appetite. Disposition SNF- likely on 3/8 given improvement in mentation and pelvic pain.  Consultants Neurology   Procedures  None  Antibiotics   Anti-infectives (From admission, onward)   Start     Dose/Rate Route Frequency Ordered Stop   09/28/19 2000  ciprofloxacin (CIPRO) IVPB 400 mg     400 mg 200 mL/hr over 60 Minutes Intravenous Every 12 hours 09/28/19 1141     09/28/19 0745  ciprofloxacin (CIPRO) IVPB 400 mg     400 mg 200 mL/hr over 60 Minutes Intravenous  Once 09/28/19 0741 09/28/19 0930      Subjective:   [name] seen and examined today.  Continues to complain of groin pain and feeling anxious.  Does not feel like eating.  Denies nausea or vomiting.  Denies chest pain or shortness of breath, headache.  Objective:   Vitals:   10/03/19 0350 10/03/19 0745 10/03/19 0809 10/03/19 1243  BP: 132/89 137/82  (!) 143/76  Pulse: 84 89  82  Resp: 19 20  20   Temp: 98.8 F (37.1 C) 98.3 F (36.8 C)  98.5 F (36.9 C)  TempSrc: Oral Oral  Oral  SpO2: 100% 99% 97% 98%  Weight:      Height:        Intake/Output Summary (Last 24 hours) at 10/03/2019 1324 Last data filed at 10/03/2019 0200 Gross per 24 hour  Intake --  Output 200 ml  Net -200 ml   Filed Weights   09/28/19 1723  Weight: 51.9 kg   Exam  General: Well developed, chronically ill-appearing, NAD  HEENT: NCAT, mucous membranes moist.   Cardiovascular: S1 S2 auscultated, RRR  Respiratory: Clear to auscultation bilaterally with equal chest rise  Abdomen: Soft, nontender, nondistended, + bowel sounds  Extremities: warm dry without cyanosis clubbing or edema  Neuro: AAOx2 (self, place), nonfocal  Psych: Appropriate mood and affect, pleasantly confused   Data Reviewed: I  have personally reviewed following labs and imaging studies  CBC: Recent Labs  Lab 09/28/19 0611 09/28/19 0615 09/29/19 0605 10/02/19 0443  WBC 5.6  --  4.5 5.1  NEUTROABS 2.5  --   --   --   HGB 11.4* 12.2 11.1* 10.7*  HCT 36.6 36.0 35.1* 33.4*  MCV 93.4  --  91.9 91.5  PLT 263  --  279 [id number]   Basic Metabolic Panel: Recent Labs  Lab 09/29/19 0605 09/30/19 0212 10/01/19 0510 10/02/19 0443 10/03/19 0238  NA 138 137 132* 133* 131*  K 3.1* 3.9 3.7 3.2* 4.2  CL 104 106 99 99 102  CO2 25 22 23 22  21*  GLUCOSE 120* 103* 131* 104* 108*  BUN <5* <5* 5* <5* <5*  CREATININE 0.63 0.61 0.59 0.90 0.97  CALCIUM 8.4* 8.5* 8.8* 8.9 8.4*  MG 1.7  --   --   --  1.2*   GFR: Estimated Creatinine Clearance: 37.3 mL/min (by C-G formula based on SCr of 0.97 mg/dL). Liver Function Tests: Recent Labs  Lab 09/28/19 0611 09/29/19 0605 10/01/19 0510  AST 43* 49* 42*  ALT 23 24 22   ALKPHOS 53 48 43  BILITOT 0.6 0.3 0.4  PROT 6.2* 5.8* 5.1*  ALBUMIN 3.0* 2.8* 2.5*   No results for input(s): LIPASE, AMYLASE in the last 168 hours. No results for input(s): AMMONIA in the last 168 hours.  Coagulation Profile: Recent Labs  Lab 09/28/19 0611  INR 1.0   Cardiac Enzymes: No results for input(s): CKTOTAL, CKMB, CKMBINDEX, TROPONINI in the last 168 hours. BNP (last 3 results) No results for input(s): PROBNP in the last 8760 hours. HbA1C: No results for input(s): HGBA1C in the last 72 hours. CBG: Recent Labs  Lab 10/02/19 1213 10/02/19 1612 10/02/19 2108 10/03/19 0628 10/03/19 1110  GLUCAP 119* 141* 90 98 107*   Lipid Profile: No results for input(s): CHOL, HDL, LDLCALC, TRIG, CHOLHDL, LDLDIRECT in the last 72 hours. Thyroid Function Tests: No results for input(s): TSH, T4TOTAL, FREET4, T3FREE, THYROIDAB in the last 72 hours. Anemia Panel: No results for input(s): VITAMINB12, FOLATE, FERRITIN, TIBC, IRON, RETICCTPCT in the last 72 hours. Urine analysis:    Component Value  Date/Time   COLORURINE YELLOW 09/28/2019 0719   APPEARANCEUR HAZY (A) 09/28/2019 0719   LABSPEC 1.004 (L) 09/28/2019 0719   PHURINE 6.0 09/28/2019 0719   GLUCOSEU NEGATIVE 09/28/2019 0719   HGBUR NEGATIVE 09/28/2019 0719   BILIRUBINUR NEGATIVE 09/28/2019 0719   BILIRUBINUR negative 09/22/2019 1541   KETONESUR NEGATIVE 09/28/2019 0719   PROTEINUR NEGATIVE 09/28/2019 0719   UROBILINOGEN 0.2 09/22/2019 1541   UROBILINOGEN 2.0 (H) 08/13/2019 1542   NITRITE NEGATIVE 09/28/2019 0719   LEUKOCYTESUR LARGE (A) 09/28/2019 0719   Sepsis Labs: @LABRCNTIP (procalcitonin:4,lacticidven:4)  ) Recent Results (from the past 240 hour(s))  Urine culture     Status: Abnormal   Collection Time: 09/28/19  7:19 AM   Specimen: Urine, Random  Result Value Ref Range Status   Specimen Description URINE, RANDOM  Final   Special Requests   Final    NONE Performed at [facility], [place] [address] [place], [place] [address]    Culture (A)  Final    >=100,000 COLONIES/mL ENTEROBACTER AEROGENES MULTI-DRUG RESISTANT ORGANISM    Report Status 10/02/2019 FINAL  Final   Organism ID, Bacteria ENTEROBACTER AEROGENES (A)  Final      Susceptibility   Enterobacter aerogenes - MIC*    CEFAZOLIN >=64 RESISTANT Resistant     CEFTRIAXONE >=64 RESISTANT Resistant     CIPROFLOXACIN <=0.25 SENSITIVE Sensitive     GENTAMICIN <=1 SENSITIVE Sensitive     IMIPENEM 1 SENSITIVE Sensitive     NITROFURANTOIN 128 RESISTANT Resistant     TRIMETH/SULFA <=20 SENSITIVE Sensitive     PIP/TAZO >=128 RESISTANT Resistant     * >=100,000 COLONIES/mL ENTEROBACTER AEROGENES  Carbapenem Resistance Panel     Status: None   Collection Time: 09/28/19  7:19 AM  Result Value Ref Range Status   Carba Resistance IMP Gene NOT DETECTED NOT DETECTED Final   Carba Resistance VIM Gene NOT DETECTED NOT DETECTED Final   Carba Resistance NDM Gene NOT DETECTED NOT DETECTED Final   Carba Resistance KPC Gene NOT DETECTED NOT DETECTED Final    Carba Resistance OXA48 Gene NOT DETECTED NOT DETECTED Final    Comment: (NOTE) Cepheid Carba-R is an FDA-cleared nucleic acid amplification test  (NAAT)for the detection and differentiation of genes encoding the  most prevalent carbapenemases in bacterial isolate samples. Carbapenemase gene identification and implementation of comprehensive  infection control measures are recommended by the CDC to prevent the  spread of the resistant organisms. Performed at [facility], [place] [address], [place], [place] [address]   SARS CORONAVIRUS 2 (TAT 6-24 HRS) Nasopharyngeal Nasopharyngeal Swab     Status: None   Collection Time: 09/28/19 12:23 PM   Specimen: Nasopharyngeal Swab  Result  Value Ref Range Status   SARS Coronavirus 2 NEGATIVE NEGATIVE Final    Comment: (NOTE) SARS-CoV-2 target nucleic acids are NOT DETECTED. The SARS-CoV-2 RNA is generally detectable in upper and lower respiratory specimens during the acute phase of infection. Negative results do not preclude SARS-CoV-2 infection, do not rule out co-infections with other pathogens, and should not be used as the sole basis for treatment or other patient management decisions. Negative results must be combined with clinical observations, patient history, and epidemiological information. The expected result is Negative. Fact Sheet for Patients: SugarRoll.be Fact Sheet for Healthcare Providers: https://www.woods-mathews.com/ This test is not yet approved or cleared by the [place] FDA and  has been authorized for detection and/or diagnosis of SARS-CoV-2 by FDA under an Emergency Use Authorization (EUA). This EUA will remain  in effect (meaning this test can be used) for the duration of the COVID-19 declaration under Section 56 4(b)(1) of the Act, 21 U.S.C. section 360bbb-3(b)(1), unless the authorization is terminated or revoked sooner. Performed at [facility], [place]  [address], [place], [place] [address]   SARS CORONAVIRUS 2 (TAT 6-24 HRS) Nasopharyngeal Nasopharyngeal Swab     Status: None   Collection Time: 10/01/19  2:11 PM   Specimen: Nasopharyngeal Swab  Result Value Ref Range Status   SARS Coronavirus 2 NEGATIVE NEGATIVE Final    Comment: (NOTE) SARS-CoV-2 target nucleic acids are NOT DETECTED. The SARS-CoV-2 RNA is generally detectable in upper and lower respiratory specimens during the acute phase of infection. Negative results do not preclude SARS-CoV-2 infection, do not rule out co-infections with other pathogens, and should not be used as the sole basis for treatment or other patient management decisions. Negative results must be combined with clinical observations, patient history, and epidemiological information. The expected result is Negative. Fact Sheet for Patients: SugarRoll.be Fact Sheet for Healthcare Providers: https://www.woods-mathews.com/ This test is not yet approved or cleared by the [place] FDA and  has been authorized for detection and/or diagnosis of SARS-CoV-2 by FDA under an Emergency Use Authorization (EUA). This EUA will remain  in effect (meaning this test can be used) for the duration of the COVID-19 declaration under Section 56 4(b)(1) of the Act, 21 U.S.C. section 360bbb-3(b)(1), unless the authorization is terminated or revoked sooner. Performed at [facility], [place] [address], [place], [place] [address]       Radiology Studies: DG Abd 1 View  Result Date: 10/02/2019 CLINICAL DATA:  84 year old with acute generalized abdominal pain and nausea/vomiting that began last night. EXAM: ABDOMEN - 1 VIEW COMPARISON:  Abdomen x-ray 05/15/2017. CT abdomen and pelvis 09/01/2019. FINDINGS: Bowel gas pattern unremarkable without evidence of obstruction or significant ileus. Expected stool burden in the colon. The rectal gas is low-lying in the pelvis, raising the question  pelvic floor laxity. No visible opaque urinary tract calculi. Phleboliths in the pelvis. Injection granulomata in the buttocks. Aorto-iliofemoral atherosclerosis. IMPRESSION: 1. No acute abdominal abnormality. Possible pelvic floor laxity. 2. Aorto-iliofemoral atherosclerosis. Electronically Signed   By: Evangeline Dakin M.D.   On: 10/02/2019 10:55   CT ABDOMEN PELVIS W CONTRAST  Result Date: 10/03/2019 CLINICAL DATA:  Complicated UTI. EXAM: CT ABDOMEN AND PELVIS WITH CONTRAST TECHNIQUE: Multidetector CT imaging of the abdomen and pelvis was performed using the standard protocol following bolus administration of intravenous contrast. CONTRAST:  166mL OMNIPAQUE IOHEXOL 300 MG/ML  SOLN COMPARISON:  09/01/2019; 08/13/2019 FINDINGS: Lower chest: Limited visualization of the lower thorax demonstrates minimal subsegmental atelectasis within the  right lower lobe and lingula. No discrete focal airspace opacities. No pleural effusion. Cardiomegaly. Small pericardial effusion, increased compared to the 09/01/2019 examination. Hepatobiliary: Normal hepatic contour. There is diffuse decreased attenuation of the hepatic parenchyma on this postcontrast examination suggestive of hepatic steatosis. Post cholecystectomy. No intra or extrahepatic biliary ductal dilatation. No ascites. Pancreas: The pancreatic duct appears mildly dilated measuring 3 mm in diameter (image 26, series 3), however this is without associated downstream pancreatic atrophy or peripancreatic stranding. No discrete pancreatic lesion. Spleen: Normal appearance of the spleen. Adrenals/Urinary Tract: There is symmetric enhancement and excretion of the bilateral kidneys. No definite renal stones on this postcontrast examination. No discrete renal lesions. No urinary obstruction or perinephric stranding. Normal appearance the bilateral adrenal glands. Normal appearance of the urinary bladder given degree distention. Stomach/Bowel: The approximately 1.9 x 1.8 cm  calcification within the left upper abdominal quadrant likely represents radiopaque debris within a gastric diverticulum (image 14, series 3; coronal image 30, series 5) and is grossly unchanged compared to the 01/2015 examination. Normal appearance of the terminal ileum. The appendix is not visualized, however there is no pericecal inflammatory change. No discrete areas of bowel wall thickening. No evidence of enteric obstruction. No pneumoperitoneum, pneumatosis or portal venous gas. Vascular/Lymphatic: Scattered calcified atherosclerotic plaque within a normal caliber abdominal aorta, not resulting in a hemodynamically significant stenosis. The major branch vessels of the abdominal aorta appear patent on this non CTA examination. No bulky retroperitoneal, mesenteric, pelvic or inguinal lymphadenopathy. Reproductive: Post hysterectomy. Dystrophic left-sided adnexal calcifications without discrete nodule or mass. Normal appearance of the right adnexa. No free fluid the pelvic cul-de-sac. Other: Minimal amount diffuse body wall anasarca. Gluteal calcifications are seen bilaterally. Musculoskeletal: No acute or aggressive osseous abnormalities. Sequela of previous cement augmentation of the T11 and T12 vertebral bodies mild (approximately 25%) compression deformities involving the L3 and L4 vertebral bodies appears similar to the 08/2018 examination. Mild-to-moderate multilevel lumbar spine DDD, worse at L1-L2, L2-L3 and L4-L5 with disc space height loss, endplate irregularity and sclerosis. IMPRESSION: 1. Normal appearance of the bilateral kidneys and renal collecting system. Specifically, no evidence of urinary obstruction or pyelonephritis. 2. Cardiomegaly. Small pericardial effusion, increased in size compared to the 09/01/2019 examination. Further evaluation with cardiac echo could be performed as indicated. 3. The approximately 1.9 x 1.8 cm calcification within the left upper abdominal quadrant likely  represents radiopaque debris within a gastric diverticulum, is grossly unchanged compared to the 01/2015 examination and of doubtful clinical concern. 4.  Aortic Atherosclerosis (ICD10-I70.0). 5. Suspected hepatic steatosis.  Correlation with LFTs is advised. Electronically Signed   By: Sandi Mariscal M.D.   On: 10/03/2019 10:50     Scheduled Meds: . aspirin EC  81 mg Oral Q M,W,F  . atorvastatin  20 mg Oral Daily  . clopidogrel  75 mg Oral Daily  . docusate sodium  100 mg Oral BID  . enoxaparin (LOVENOX) injection  40 mg Subcutaneous Q24H  . feeding supplement (GLUCERNA SHAKE)  237 mL Oral TID BM  . folic acid  1 mg Oral Daily  . insulin aspart  0-6 Units Subcutaneous TID WC  . metoCLOPramide  5 mg Oral TID AC  . metoprolol tartrate  50 mg Oral BID  . mirtazapine  7.5 mg Oral QHS  . mometasone-formoterol  2 puff Inhalation BID  . sodium chloride flush  3 mL Intravenous Q12H  . cyanocobalamin  1,000 mcg Oral Daily   Continuous Infusions: . sodium chloride 50 mL/hr at  10/03/19 0957  . ciprofloxacin 400 mg (10/03/19 1250)     LOS: 5 days   Time Spent in minutes   45 minutes  Kawika Bischoff D.O. on 10/03/2019 at 1:24 PM  Between 7am to 7pm - Please see pager noted on [email or web address]  After 7pm go to [email or web address]  And look for the night coverage person covering for me after hours  [facility] Office  [phone]

## 2019-10-03 NOTE — Plan of Care (Signed)

## 2019-10-04 LAB — BASIC METABOLIC PANEL
Anion gap: 8 (ref 5–15)
BUN: 5 mg/dL — ABNORMAL LOW (ref 8–23)
CO2: 24 mmol/L (ref 22–32)
Calcium: 8.6 mg/dL — ABNORMAL LOW (ref 8.9–10.3)
Chloride: 100 mmol/L (ref 98–111)
Creatinine, Ser: 1.01 mg/dL — ABNORMAL HIGH (ref 0.44–1.00)
GFR calc Af Amer: >60 mL/min (ref >60)
GFR calc non Af Amer: 52 mL/min — ABNORMAL LOW (ref >60)
Glucose, Bld: 111 mg/dL — ABNORMAL HIGH (ref 70–99)
Potassium: 3.6 mmol/L (ref 3.5–5.1)
Sodium: 132 mmol/L — ABNORMAL LOW (ref 135–145)

## 2019-10-04 LAB — MAGNESIUM: Magnesium: 2.9 mg/dL — ABNORMAL HIGH (ref 1.7–2.4)

## 2019-10-04 LAB — GLUCOSE, CAPILLARY
Glucose-Capillary: 110 mg/dL — ABNORMAL HIGH (ref 70–99)
Glucose-Capillary: 116 mg/dL — ABNORMAL HIGH (ref 70–99)
Glucose-Capillary: 104 mg/dL — ABNORMAL HIGH (ref 70–99)
Glucose-Capillary: 103 mg/dL — ABNORMAL HIGH (ref 70–99)

## 2019-10-04 LAB — SARS CORONAVIRUS 2 (TAT 6-24 HRS): SARS Coronavirus 2: NEGATIVE

## 2019-10-04 MED ORDER — TAMSULOSIN HCL 0.4 MG PO CAPS
0.4000 mg | ORAL_CAPSULE | Freq: Every day | ORAL | Status: DC
  Administered 2019-10-04 – 2019-10-05 (×2): 0.4 mg via ORAL
  Filled 2019-10-04 (×2): qty 1

## 2019-10-04 NOTE — Discharge Summary (Signed)
Physician Discharge Summary  [name] [id number] DOB: Jan 28, 1936 DOA: 09/28/2019  PCP: [name], PA-C  Admit date: 09/28/2019 Discharge date: 10/05/2019  Time spent: 45 minutes  Recommendations for Outpatient Follow-up:  Patient will be discharged to skilled nursing facility, continue physical and occupational therapy.  Patient will need to follow up with primary care provider within one week of discharge.  Patient should continue medications as prescribed.  Patient should follow a dysphagia 1 diet.   Discharge Diagnoses:  Acute metabolic encephalopathy Acute Enterobacter aerogenes UTI Essential hypertension and tachycardia Acute hyponatremia Hypokalemia Hypomagnesemia Failure to thrive/underweight Recent CVA Normocytic anemia Diabetes mellitus, type II Hyperlipidemia Elevated AST Deconditioning Anxiety  Discharge Condition: Stable  Diet recommendation: dysphagia 1  Filed Weights   09/28/19 1723  Weight: 51.9 kg    History of present illness:  on 09/28/2019 by Dr. Carolin Spencer a 84 y.o.femalewith medical history significant ofhypertension, hyperlipidemia, diastolic heart failure, rheumatoid arthritis on chronic immunosuppression, GERD, and recent CVA of the left posterior frontal lobe on 2/18. She presents after being found to be acutely altered. Husband had called EMSthis morningbecause she was disorientedand was incontinent of urine. Patient had beenbeingtreated for a UTI as an outpatienton cephalexin, but review of records shows cultures from 2/24werenoted to be resistant. The patient's primary care provider had called in a prescription for ciprofloxacin yesterday, but family had not been able to pick it up yet. Since the patient had been discharged home from the hospital on 2/21, family reports that she had not been eating or drinking much. Patient denies having any complaints of pain or shortness of  breath.  Hospital Course:  Acute metabolic encephalopathy -possibly secondary to UTI vs failure to thrive and poor oral intake  -unknown baseline- have tried to reach out husband  -MRI brain: No evidence of acute intercranial normality.  Expected evolution of known small subacute infarct within the posterior left frontal lobe. -upon review of chart, patient has had confusion since previous admission -Appears to be calm this morning and AAOx2  Acute Enterobacter aerogenes UTI -Urine culture from 10/17/2019 showed Enterobacter aerogenes -Urine culture from 09/22/2019 showed Klebsiella originates, resistance to cephalosporins -PCP had tried to start patient on ciprofloxacin however patient's family was unable to obtain medication -Currently on IV ciprofloxacin -Urine culture shows multidrug resistant organism, however sensitive to ciprofloxacin -Patient with suprapubic pain, obtained x-ray which showed no acute abdominal abnormality.  Possible pelvic floor laxity. -Patient continues to complain of pelvic/groin pain -Obtain CT abdomen pelvis which showed normal appearance of bilateral kidneys and renal collecting system.  No urinary obstruction or pyelonephritis.  Essential hypertension and tachycardia -BP stable  -Continue metoprolol  Acute mild hyponatremia -sodium 130 -repeat in one week  Hypokalemia -resolved with replacement  Hypomagnesemia -resolved with replacement  -repeat BMP in one week  Failure to thrive/underweight -BMI of 17.37 -At home, patient reported not to be eating or drinking much -Suspect symptoms to be secondary to UTI -Continue supplements -Continue on IVF due to poor oral intake -Have added low-dose Remeron  Recent CVA -Patient suffered a stroke, left posterior frontal lobe on 09/16/2019 -Follow-up with neurology as an outpatient  -Continue Plavix and aspirin  Normocytic anemia -hemoglobin stable  Diabetes mellitus, type II -hemoglobin  A1c 6.2 on 09/17/2019 -Not on any diabetic medications at home, likely diet controlled  Hyperlipidemia -Recent lipid panel from 09/17/2019 showed total cholesterol 106, HDL 34, LDL 34, triglycerides 190 -Continue statin  Elevated AST -Acute on chronic -AST elevated to 42 (  was higher in Jan 2021)  Deconditioning -PT recommended home health -Recommended home health, if patient's family unable to provide 24/7 assist may need SNF.  3 and 1 bedside commode.  Anxiety -Started patient on low-dose Ativan which seems to have helped  Code status: DNR   Procedures: None  Consultations: Neurology   Discharge Exam: Vitals:   10/05/19 0825 10/05/19 1108  BP: 120/63 (!) 109/59  Pulse: 85 90  Resp: 16 20  Temp: 98.6 F (37 C) 99.4 F (37.4 C)  SpO2: 98% 96%     General: Well developed, chronically ill appearing, NAD  HEENT: NCAT, mucous membranes moist.  Cardiovascular: S1 S2 auscultated, RRR  Respiratory: Clear to auscultation bilaterally  Abdomen: Soft, nontender, nondistended, + bowel sounds  Extremities: warm dry without cyanosis clubbing or edema  Neuro: AAOx2 (self, place), nonfocal  Psych: Pleasant, appropriate mood and affect  Discharge Instructions Discharge Instructions    Discharge instructions   Complete by: As directed    Patient will be discharged to skilled nursing facility, continue physical and occupational therapy.  Patient will need to follow up with primary care provider within one week of discharge.  Patient should continue medications as prescribed.  Patient should follow a dysphagia 1 diet.     Allergies as of 10/05/2019      Reactions   Lactose Intolerance (gi)       Medication List    STOP taking these medications   cephALEXin 500 MG capsule Commonly known as: KEFLEX   ciprofloxacin 250 MG tablet Commonly known as: Cipro     TAKE these medications   albuterol 0.63 MG/3ML nebulizer solution Commonly known as: ACCUNEB Take 1  ampule by nebulization every 6 (six) hours as needed for wheezing.   aspirin EC 81 MG tablet Take 1 tablet (81 mg total) by mouth every Monday, Wednesday, and Friday.   atorvastatin 20 MG tablet Commonly known as: Lipitor Take 1 tablet (20 mg total) by mouth daily. Take 1 Tablet by mouth daily. What changed: additional instructions   clopidogrel 75 MG tablet Commonly known as: PLAVIX Take 1 tablet (75 mg total) by mouth daily.   cyanocobalamin 1000 MCG tablet Take 1 tablet (1,000 mcg total) by mouth daily.   dicyclomine 10 MG capsule Commonly known as: BENTYL Take 1 capsule (10 mg total) by mouth every 8 (eight) hours as needed for spasms.   docusate sodium 100 MG capsule Commonly known as: COLACE Take 1 capsule (100 mg total) by mouth 2 (two) times daily.   Dulera 200-5 MCG/ACT Aero Generic drug: mometasone-formoterol Inhale 2 puffs into the lungs 2 (two) times daily.   feeding supplement (ENSURE ENLIVE) Liqd Take 237 mLs by mouth 3 (three) times daily between meals.   folic acid 1 MG tablet Commonly known as: FOLVITE Take 1 tablet (1 mg total) by mouth daily.   Gas-X Extra Strength 125 MG chewable tablet Generic drug: simethicone Chew 125 mg by mouth every 6 (six) hours as needed for flatulence.   hydrOXYzine 10 MG tablet Commonly known as: ATARAX/VISTARIL 1 q 8h prn itching What changed:   how much to take  how to take this  when to take this  reasons to take this  additional instructions   ipratropium-albuterol 0.5-2.5 (3) MG/3ML Soln Commonly known as: DUONEB Take 3 mLs by nebulization every 6 (six) hours as needed.   LORazepam 0.5 MG tablet Commonly known as: ATIVAN Take 1 tablet (0.5 mg total) by mouth every 6 (six) hours as  needed for anxiety.   metoCLOPramide 5 MG tablet Commonly known as: REGLAN Take 1 tablet (5 mg total) by mouth 3 (three) times daily before meals.   metoprolol tartrate 50 MG tablet Commonly known as: LOPRESSOR Take 1  tablet (50 mg total) by mouth 2 (two) times daily. What changed:   medication strength  how much to take   mirtazapine 15 MG disintegrating tablet Commonly known as: REMERON SOL-TAB Take 0.5 tablets (7.5 mg total) by mouth at bedtime.   multivitamin with minerals Tabs tablet Take 1 tablet by mouth daily.   ondansetron 4 MG disintegrating tablet Commonly known as: Zofran ODT Take 1 tablet (4 mg total) by mouth every 8 (eight) hours as needed for up to 30 doses for nausea or vomiting.   OneTouch Verio test strip Generic drug: glucose blood Use to check blood sugar 3 to 4 times a day before meals   tamsulosin 0.4 MG Caps capsule Commonly known as: FLOMAX Take 1 capsule (0.4 mg total) by mouth daily. Start taking on: October 06, 2019      Allergies  Allergen Reactions  . Lactose Intolerance (Gi)     Contact information for follow-up providers    Rodriguez-[place], Sunday Spillers, PA-C. Schedule an appointment as soon as possible for a visit in 1 week(s).   Specialties: Internal Medicine, Emergency Medicine Why: Hospital follow up Contact information: [address] [place] [address] [phone]            Contact information for after-discharge care    Destination    [facility] .   Service: Skilled Nursing Contact information: [place] [phone]                   The results of significant diagnostics from this hospitalization (including imaging, microbiology, ancillary and laboratory) are listed below for reference.    Significant Diagnostic Studies: CT Angio Head W or Wo Contrast  Result Date: 09/16/2019 CLINICAL DATA:  Right arm weakness.  Stroke code. EXAM: CT ANGIOGRAPHY HEAD AND NECK CT PERFUSION BRAIN TECHNIQUE: Multidetector CT imaging of the head and neck was performed using the standard protocol during bolus administration of intravenous contrast. Multiplanar CT  image reconstructions and MIPs were obtained to evaluate the vascular anatomy. Carotid stenosis measurements (when applicable) are obtained utilizing NASCET criteria, using the distal internal carotid diameter as the denominator. Multiphase CT imaging of the brain was performed following IV bolus contrast injection. Subsequent parametric perfusion maps were calculated using RAPID software. CONTRAST:  165mL OMNIPAQUE IOHEXOL 350 MG/ML SOLN COMPARISON:  Noncontrast head CT performed earlier the same day. FINDINGS: CTA NECK FINDINGS Aortic arch: Common origin of the innominate and left common carotid arteries. No significant atherosclerotic disease within the visualized aortic arch. No significant innominate or proximal subclavian artery stenosis. Right carotid system: CCA and ICA patent within the neck. Calcified plaque within the proximal ICA results in less than 50% stenosis. Left carotid system: CCA and ICA patent within the neck. Calcified plaque within the proximal ICA results in less than 50% stenosis. Vertebral arteries: The right vertebral artery is slightly dominant. The vertebral arteries are patent within the neck bilaterally without stenosis. Skeleton: No acute bony abnormality. Cervical spondylosis with multilevel posterior disc osteophytes, uncovertebral and facet hypertrophy. Multilevel degenerative fusion within the cervical and upper thoracic spine related to prominent ventrolateral bridging osteophytes. Other neck: No soft tissue neck mass or cervical lymphadenopathy. Thyroid unremarkable. Upper chest: There is no  consolidation within the imaged lung apices. Review of the MIP images confirms the above findings CTA HEAD FINDINGS Anterior circulation: The intracranial internal carotid arteries are patent bilaterally. Mild calcified plaque within the cavernous segments. Mild stenosis within the left cavernous ICA. The M1 middle cerebral arteries are patent bilaterally without significant stenosis. No M2  proximal branch occlusion or high-grade proximal stenosis is identified. The anterior cerebral arteries are patent bilaterally without high-grade proximal stenosis. The A1 right ACA is hypoplastic. No intracranial aneurysm is identified. Posterior circulation: The intracranial vertebral arteries are patent bilaterally. Calcified plaque within the intracranial right vertebral artery with focal mild/moderate stenosis. The basilar artery is patent without significant stenosis. Fetal origin of the right posterior cerebral artery. A small posterior communicating arteries present on the left. The posterior cerebral arteries are patent bilaterally. Moderate to moderately advanced segmental stenosis within the P2 right posterior cerebral artery (series 11, image 21) Venous sinuses: Within limitations of contrast timing, no convincing thrombus. Anatomic variants: As described. Review of the MIP images confirms the above findings CT Brain Perfusion Findings: CBF (<30%) Volume: 73mL Perfusion (Tmax>6.0s) volume: 56mL Mismatch Volume: 29mL Infarction Location:None identified IMPRESSION: CTA neck: 1. The common and internal carotid arteries are patent within the neck. Calcified plaque within the proximal ICAs results in less than 50% stenosis bilaterally. 2. The vertebral arteries are patent within the neck without stenosis. 3. Cervical spondylosis with multilevel posterior disc osteophytes, uncovertebral and facet hypertrophy. There is also multilevel degenerative fusion within the cervical and upper thoracic spine related to bridging ventral osteophytes. CTA head: 1. No intracranial large vessel occlusion. 2. Mild calcified plaque within the intracranial internal carotid arteries. Mild stenosis of the cavernous left ICA. 3. Calcified plaque results in focal mild/moderate stenosis within the V4 right vertebral artery. 4. Moderate segmental stenosis within the P2 right posterior cerebral artery. CT perfusion head: The CT perfusion  software identifies no core infarct. The CT perfusion software identifies no region of critically hypoperfused parenchyma utilizing a Tmax>6 seconds threshold. Electronically Signed   By: Kellie Simmering DO   On: 09/16/2019 13:49   DG Abd 1 View  Result Date: 10/02/2019 CLINICAL DATA:  84 year old with acute generalized abdominal pain and nausea/vomiting that began last night. EXAM: ABDOMEN - 1 VIEW COMPARISON:  Abdomen x-ray 05/15/2017. CT abdomen and pelvis 09/01/2019. FINDINGS: Bowel gas pattern unremarkable without evidence of obstruction or significant ileus. Expected stool burden in the colon. The rectal gas is low-lying in the pelvis, raising the question pelvic floor laxity. No visible opaque urinary tract calculi. Phleboliths in the pelvis. Injection granulomata in the buttocks. Aorto-iliofemoral atherosclerosis. IMPRESSION: 1. No acute abdominal abnormality. Possible pelvic floor laxity. 2. Aorto-iliofemoral atherosclerosis. Electronically Signed   By: Evangeline Dakin M.D.   On: 10/02/2019 10:55   CT Angio Neck W and/or Wo Contrast  Result Date: 09/16/2019 CLINICAL DATA:  Right arm weakness.  Stroke code. EXAM: CT ANGIOGRAPHY HEAD AND NECK CT PERFUSION BRAIN TECHNIQUE: Multidetector CT imaging of the head and neck was performed using the standard protocol during bolus administration of intravenous contrast. Multiplanar CT image reconstructions and MIPs were obtained to evaluate the vascular anatomy. Carotid stenosis measurements (when applicable) are obtained utilizing NASCET criteria, using the distal internal carotid diameter as the denominator. Multiphase CT imaging of the brain was performed following IV bolus contrast injection. Subsequent parametric perfusion maps were calculated using RAPID software. CONTRAST:  174mL OMNIPAQUE IOHEXOL 350 MG/ML SOLN COMPARISON:  Noncontrast head CT performed earlier the same day. FINDINGS:  CTA NECK FINDINGS Aortic arch: Common origin of the innominate and left  common carotid arteries. No significant atherosclerotic disease within the visualized aortic arch. No significant innominate or proximal subclavian artery stenosis. Right carotid system: CCA and ICA patent within the neck. Calcified plaque within the proximal ICA results in less than 50% stenosis. Left carotid system: CCA and ICA patent within the neck. Calcified plaque within the proximal ICA results in less than 50% stenosis. Vertebral arteries: The right vertebral artery is slightly dominant. The vertebral arteries are patent within the neck bilaterally without stenosis. Skeleton: No acute bony abnormality. Cervical spondylosis with multilevel posterior disc osteophytes, uncovertebral and facet hypertrophy. Multilevel degenerative fusion within the cervical and upper thoracic spine related to prominent ventrolateral bridging osteophytes. Other neck: No soft tissue neck mass or cervical lymphadenopathy. Thyroid unremarkable. Upper chest: There is no consolidation within the imaged lung apices. Review of the MIP images confirms the above findings CTA HEAD FINDINGS Anterior circulation: The intracranial internal carotid arteries are patent bilaterally. Mild calcified plaque within the cavernous segments. Mild stenosis within the left cavernous ICA. The M1 middle cerebral arteries are patent bilaterally without significant stenosis. No M2 proximal branch occlusion or high-grade proximal stenosis is identified. The anterior cerebral arteries are patent bilaterally without high-grade proximal stenosis. The A1 right ACA is hypoplastic. No intracranial aneurysm is identified. Posterior circulation: The intracranial vertebral arteries are patent bilaterally. Calcified plaque within the intracranial right vertebral artery with focal mild/moderate stenosis. The basilar artery is patent without significant stenosis. Fetal origin of the right posterior cerebral artery. A small posterior communicating arteries present on the  left. The posterior cerebral arteries are patent bilaterally. Moderate to moderately advanced segmental stenosis within the P2 right posterior cerebral artery (series 11, image 21) Venous sinuses: Within limitations of contrast timing, no convincing thrombus. Anatomic variants: As described. Review of the MIP images confirms the above findings CT Brain Perfusion Findings: CBF (<30%) Volume: 52mL Perfusion (Tmax>6.0s) volume: 46mL Mismatch Volume: 70mL Infarction Location:None identified IMPRESSION: CTA neck: 1. The common and internal carotid arteries are patent within the neck. Calcified plaque within the proximal ICAs results in less than 50% stenosis bilaterally. 2. The vertebral arteries are patent within the neck without stenosis. 3. Cervical spondylosis with multilevel posterior disc osteophytes, uncovertebral and facet hypertrophy. There is also multilevel degenerative fusion within the cervical and upper thoracic spine related to bridging ventral osteophytes. CTA head: 1. No intracranial large vessel occlusion. 2. Mild calcified plaque within the intracranial internal carotid arteries. Mild stenosis of the cavernous left ICA. 3. Calcified plaque results in focal mild/moderate stenosis within the V4 right vertebral artery. 4. Moderate segmental stenosis within the P2 right posterior cerebral artery. CT perfusion head: The CT perfusion software identifies no core infarct. The CT perfusion software identifies no region of critically hypoperfused parenchyma utilizing a Tmax>6 seconds threshold. Electronically Signed   By: Kellie Simmering DO   On: 09/16/2019 13:49   CT Angio Chest PE W and/or Wo Contrast  Result Date: 09/06/2019 CLINICAL DATA:  Short of breath EXAM: CT ANGIOGRAPHY CHEST WITH CONTRAST TECHNIQUE: Multidetector CT imaging of the chest was performed using the standard protocol during bolus administration of intravenous contrast. Multiplanar CT image reconstructions and MIPs were obtained to evaluate the  vascular anatomy. CONTRAST:  58mL OMNIPAQUE IOHEXOL 350 MG/ML SOLN COMPARISON:  09/06/2019, 06/14/2015 FINDINGS: Cardiovascular: This is a technically adequate evaluation of the pulmonary vasculature. No filling defects or pulmonary emboli. There is a small pericardial effusion versus  pericardial thickening, new since prior exam. If further evaluation is desired, echocardiography could be considered. Thoracic aorta is grossly normal in caliber with only minimal atheromatous plaque. Mediastinum/Nodes: No enlarged mediastinal, hilar, or axillary lymph nodes. Thyroid gland, trachea, and esophagus demonstrate no significant findings. Lungs/Pleura: There are dependent hypoventilatory changes within the bilateral lower lobes. No acute airspace disease, effusion, or pneumothorax. The central airways are patent. Upper Abdomen: No acute abnormalities. Stable calcified mass arising from the gastric wall. Musculoskeletal: There are no acute or destructive bony lesions. Chronic T12 compression deformity and vertebral augmentation at T11 and T12. Reconstructed images demonstrate no additional findings. Review of the MIP images confirms the above findings. IMPRESSION: 1. Small pericardial effusion versus pericardial thickening. There is some stranding within the pericardial fat. If pericarditis is a clinical concern, echocardiography could be considered. 2. No evidence of pulmonary embolus. 3. Otherwise no acute intrathoracic process. 4.  Aortic Atherosclerosis (ICD10-I70.0). Electronically Signed   By: Randa Ngo M.D.   On: 09/06/2019 16:28   MR BRAIN WO CONTRAST  Result Date: 09/28/2019 CLINICAL DATA:  Altered mental status. Additional history obtained from [place]: MRI HEAD WITHOUT CONTRAST TECHNIQUE: Multiplanar, multiecho pulse sequences of the brain and surrounding structures were obtained without intravenous contrast. COMPARISON:  Noncontrast head CT performed earlier the same day  09/28/2019, brain MRI 09/16/2019 FINDINGS: Brain: The patient was unable to tolerate the full examination. As a result, the axial T1 weighted sequence is moderately motion degraded and a coronal T2 weighted sequence could not be obtained. There is no evidence of acute infarct. No evidence of intracranial mass. No midline shift or extra-axial fluid collection. No chronic intracranial blood products. There is a small focus of T2/FLAIR hyperintensity with T2 shine through in the subcortical left posterior frontal lobe at site of a known, now subacute, infarct. Moderate T2/FLAIR hyperintense signal changes within cerebral white matter are unchanged and consistent with chronic small vessel ischemic disease. Stable, moderate generalized parenchymal atrophy. Vascular: Flow voids maintained within the proximal large arterial vessels. Skull and upper cervical spine: No focal marrow lesion. Sinuses/Orbits: Visualized orbits demonstrate no acute abnormality. Chronic deformity of the left lamina papyracea. No significant paranasal sinus disease or mastoid effusion. IMPRESSION: 1. Prematurely terminated examination as described. 2. No evidence of acute intracranial abnormality. 3. Expected evolution of a known small subacute infarct within the posterior left frontal lobe. 4. Stable moderate generalized parenchymal atrophy and chronic small vessel ischemic disease. Electronically Signed   By: Kellie Simmering DO   On: 09/28/2019 12:18   MR BRAIN WO CONTRAST  Result Date: 09/16/2019 CLINICAL DATA:  Follow-up stroke.  Right arm weakness. EXAM: MRI HEAD WITHOUT CONTRAST TECHNIQUE: Multiplanar, multiecho pulse sequences of the brain and surrounding structures were obtained without intravenous contrast. COMPARISON:  CT studies earlier same day FINDINGS: The study suffers from marked motion degradation. Brain: Small (1-2 cm) region of early acute infarction in the left posterior frontal region. No other acute finding. No abnormality  seen affecting the brainstem or cerebellum. Mild chronic small-vessel changes of the hemispheric white matter considering age. No hydrocephalus or extra-axial collection. No sign of hemorrhage. Vascular: Major vessels at the base of the brain show flow. Skull and upper cervical spine: Negative Sinuses/Orbits: Clear/normal Other: None IMPRESSION: 1-2 cm region of early acute infarction in the left posterior frontal brain. No mass effect or hemorrhage evident. Electronically Signed   By: Nelson Chimes M.D.   On: 09/16/2019 16:27   CT ABDOMEN PELVIS  W CONTRAST  Result Date: 10/03/2019 CLINICAL DATA:  Complicated UTI. EXAM: CT ABDOMEN AND PELVIS WITH CONTRAST TECHNIQUE: Multidetector CT imaging of the abdomen and pelvis was performed using the standard protocol following bolus administration of intravenous contrast. CONTRAST:  181mL OMNIPAQUE IOHEXOL 300 MG/ML  SOLN COMPARISON:  09/01/2019; 08/13/2019 FINDINGS: Lower chest: Limited visualization of the lower thorax demonstrates minimal subsegmental atelectasis within the right lower lobe and lingula. No discrete focal airspace opacities. No pleural effusion. Cardiomegaly. Small pericardial effusion, increased compared to the 09/01/2019 examination. Hepatobiliary: Normal hepatic contour. There is diffuse decreased attenuation of the hepatic parenchyma on this postcontrast examination suggestive of hepatic steatosis. Post cholecystectomy. No intra or extrahepatic biliary ductal dilatation. No ascites. Pancreas: The pancreatic duct appears mildly dilated measuring 3 mm in diameter (image 26, series 3), however this is without associated downstream pancreatic atrophy or peripancreatic stranding. No discrete pancreatic lesion. Spleen: Normal appearance of the spleen. Adrenals/Urinary Tract: There is symmetric enhancement and excretion of the bilateral kidneys. No definite renal stones on this postcontrast examination. No discrete renal lesions. No urinary obstruction or  perinephric stranding. Normal appearance the bilateral adrenal glands. Normal appearance of the urinary bladder given degree distention. Stomach/Bowel: The approximately 1.9 x 1.8 cm calcification within the left upper abdominal quadrant likely represents radiopaque debris within a gastric diverticulum (image 14, series 3; coronal image 30, series 5) and is grossly unchanged compared to the 01/2015 examination. Normal appearance of the terminal ileum. The appendix is not visualized, however there is no pericecal inflammatory change. No discrete areas of bowel wall thickening. No evidence of enteric obstruction. No pneumoperitoneum, pneumatosis or portal venous gas. Vascular/Lymphatic: Scattered calcified atherosclerotic plaque within a normal caliber abdominal aorta, not resulting in a hemodynamically significant stenosis. The major branch vessels of the abdominal aorta appear patent on this non CTA examination. No bulky retroperitoneal, mesenteric, pelvic or inguinal lymphadenopathy. Reproductive: Post hysterectomy. Dystrophic left-sided adnexal calcifications without discrete nodule or mass. Normal appearance of the right adnexa. No free fluid the pelvic cul-de-sac. Other: Minimal amount diffuse body wall anasarca. Gluteal calcifications are seen bilaterally. Musculoskeletal: No acute or aggressive osseous abnormalities. Sequela of previous cement augmentation of the T11 and T12 vertebral bodies mild (approximately 25%) compression deformities involving the L3 and L4 vertebral bodies appears similar to the 08/2018 examination. Mild-to-moderate multilevel lumbar spine DDD, worse at L1-L2, L2-L3 and L4-L5 with disc space height loss, endplate irregularity and sclerosis. IMPRESSION: 1. Normal appearance of the bilateral kidneys and renal collecting system. Specifically, no evidence of urinary obstruction or pyelonephritis. 2. Cardiomegaly. Small pericardial effusion, increased in size compared to the 09/01/2019  examination. Further evaluation with cardiac echo could be performed as indicated. 3. The approximately 1.9 x 1.8 cm calcification within the left upper abdominal quadrant likely represents radiopaque debris within a gastric diverticulum, is grossly unchanged compared to the 01/2015 examination and of doubtful clinical concern. 4.  Aortic Atherosclerosis (ICD10-I70.0). 5. Suspected hepatic steatosis.  Correlation with LFTs is advised. Electronically Signed   By: Sandi Mariscal M.D.   On: 10/03/2019 10:50   CT CEREBRAL PERFUSION W CONTRAST  Result Date: 09/16/2019 CLINICAL DATA:  Right arm weakness.  Stroke code. EXAM: CT ANGIOGRAPHY HEAD AND NECK CT PERFUSION BRAIN TECHNIQUE: Multidetector CT imaging of the head and neck was performed using the standard protocol during bolus administration of intravenous contrast. Multiplanar CT image reconstructions and MIPs were obtained to evaluate the vascular anatomy. Carotid stenosis measurements (when applicable) are obtained utilizing NASCET criteria, using the distal  internal carotid diameter as the denominator. Multiphase CT imaging of the brain was performed following IV bolus contrast injection. Subsequent parametric perfusion maps were calculated using RAPID software. CONTRAST:  120mL OMNIPAQUE IOHEXOL 350 MG/ML SOLN COMPARISON:  Noncontrast head CT performed earlier the same day. FINDINGS: CTA NECK FINDINGS Aortic arch: Common origin of the innominate and left common carotid arteries. No significant atherosclerotic disease within the visualized aortic arch. No significant innominate or proximal subclavian artery stenosis. Right carotid system: CCA and ICA patent within the neck. Calcified plaque within the proximal ICA results in less than 50% stenosis. Left carotid system: CCA and ICA patent within the neck. Calcified plaque within the proximal ICA results in less than 50% stenosis. Vertebral arteries: The right vertebral artery is slightly dominant. The vertebral  arteries are patent within the neck bilaterally without stenosis. Skeleton: No acute bony abnormality. Cervical spondylosis with multilevel posterior disc osteophytes, uncovertebral and facet hypertrophy. Multilevel degenerative fusion within the cervical and upper thoracic spine related to prominent ventrolateral bridging osteophytes. Other neck: No soft tissue neck mass or cervical lymphadenopathy. Thyroid unremarkable. Upper chest: There is no consolidation within the imaged lung apices. Review of the MIP images confirms the above findings CTA HEAD FINDINGS Anterior circulation: The intracranial internal carotid arteries are patent bilaterally. Mild calcified plaque within the cavernous segments. Mild stenosis within the left cavernous ICA. The M1 middle cerebral arteries are patent bilaterally without significant stenosis. No M2 proximal branch occlusion or high-grade proximal stenosis is identified. The anterior cerebral arteries are patent bilaterally without high-grade proximal stenosis. The A1 right ACA is hypoplastic. No intracranial aneurysm is identified. Posterior circulation: The intracranial vertebral arteries are patent bilaterally. Calcified plaque within the intracranial right vertebral artery with focal mild/moderate stenosis. The basilar artery is patent without significant stenosis. Fetal origin of the right posterior cerebral artery. A small posterior communicating arteries present on the left. The posterior cerebral arteries are patent bilaterally. Moderate to moderately advanced segmental stenosis within the P2 right posterior cerebral artery (series 11, image 21) Venous sinuses: Within limitations of contrast timing, no convincing thrombus. Anatomic variants: As described. Review of the MIP images confirms the above findings CT Brain Perfusion Findings: CBF (<30%) Volume: 60mL Perfusion (Tmax>6.0s) volume: 62mL Mismatch Volume: 6mL Infarction Location:None identified IMPRESSION: CTA neck: 1.  The common and internal carotid arteries are patent within the neck. Calcified plaque within the proximal ICAs results in less than 50% stenosis bilaterally. 2. The vertebral arteries are patent within the neck without stenosis. 3. Cervical spondylosis with multilevel posterior disc osteophytes, uncovertebral and facet hypertrophy. There is also multilevel degenerative fusion within the cervical and upper thoracic spine related to bridging ventral osteophytes. CTA head: 1. No intracranial large vessel occlusion. 2. Mild calcified plaque within the intracranial internal carotid arteries. Mild stenosis of the cavernous left ICA. 3. Calcified plaque results in focal mild/moderate stenosis within the V4 right vertebral artery. 4. Moderate segmental stenosis within the P2 right posterior cerebral artery. CT perfusion head: The CT perfusion software identifies no core infarct. The CT perfusion software identifies no region of critically hypoperfused parenchyma utilizing a Tmax>6 seconds threshold. Electronically Signed   By: Kellie Simmering DO   On: 09/16/2019 13:49   DG CHEST PORT 1 VIEW  Result Date: 09/28/2019 CLINICAL DATA:  Shortness of breath. EXAM: PORTABLE CHEST 1 VIEW COMPARISON:  September 16, 2019. FINDINGS: Stable cardiomegaly. No pneumothorax or pleural effusion is noted. Both lungs are clear. The visualized skeletal structures are unremarkable. IMPRESSION: No  active disease. Electronically Signed   By: Marijo Conception M.D.   On: 09/28/2019 14:56   DG Chest Port 1 View  Result Date: 09/16/2019 CLINICAL DATA:  Shortness of breath.  Chest pain. EXAM: PORTABLE CHEST 1 VIEW COMPARISON:  None. FINDINGS: Cardiomegaly. The hila and mediastinum are normal. No pneumothorax. No nodules or masses. No focal infiltrates. IMPRESSION: No active disease. Electronically Signed   By: Dorise Bullion III M.D   On: 09/16/2019 14:24   DG Chest Port 1 View  Result Date: 09/06/2019 CLINICAL DATA:  Shortness of breath. EXAM:  PORTABLE CHEST 1 VIEW COMPARISON:  Chest x-ray dated August 27, 2019. FINDINGS: The heart size and mediastinal contours are within normal limits. Normal pulmonary vascularity. No focal consolidation, pleural effusion, or pneumothorax. Unchanged mild scarring in the right middle lobe. No acute osseous abnormality. IMPRESSION: No active disease. Electronically Signed   By: Titus Dubin M.D.   On: 09/06/2019 11:46   CT HEAD CODE STROKE WO CONTRAST  Result Date: 09/28/2019 CLINICAL DATA:  Code stroke. Facial droop. Aphasia. Last seen normal 22:00 Last evening. Recent infarct. EXAM: CT HEAD WITHOUT CONTRAST TECHNIQUE: Contiguous axial images were obtained from the base of the skull through the vertex without intravenous contrast. COMPARISON:  MR head without contrast 09/16/2019. CT head and neck and CT perfusion 09/16/2019 FINDINGS: Brain: Mild atrophy and moderate white matter disease is stable. Basal ganglia are intact. Insular ribbon is normal. Acute or focal cortical abnormality is present. The ventricles are proportionate to the degree of atrophy. No significant extraaxial fluid collection is present. The brainstem and cerebellum are within normal limits. Vascular: Atherosclerotic calcifications are present within the cavernous internal carotid arteries bilaterally and at the dural margin of the right vertebral artery. No hyperdense vessel is present. Skull: Insert normal skull No significant extracranial soft tissue lesion is present. Sinuses/Orbits: The paranasal sinuses and mastoid air cells are clear. A remote left orbital blowout fracture is present. Globes and orbits are otherwise within normal limits. ASPECTS [facility] Stroke Program Early CT Score) - Ganglionic level infarction (caudate, lentiform nuclei, internal capsule, insula, M1-M3 cortex): 7/7 - Supraganglionic infarction (M4-M6 cortex): 3/3 Total score (0-10 with 10 being normal): 10/10 IMPRESSION: 1. No acute intracranial abnormality or  significant interval change. 2. Stable atrophy and white matter disease. This likely reflects the sequela of chronic microvascular ischemia. 3. ASPECTS is 10/10 The above was relayed via text pager to Dr. Roland Rack on 09/28/2019 at 05:59 . Electronically Signed   By: San Morelle M.D.   On: 09/28/2019 06:00   CT HEAD CODE STROKE WO CONTRAST  Result Date: 09/16/2019 CLINICAL DATA:  Code stroke. EXAM: CT HEAD WITHOUT CONTRAST TECHNIQUE: Contiguous axial images were obtained from the base of the skull through the vertex without intravenous contrast. COMPARISON:  None. FINDINGS: Brain: No evidence of acute infarction, hemorrhage, hydrocephalus, extra-axial collection or mass lesion/mass effect. Hypodensity of the periventricular white matter likely related to chronic small vessel ischemia. Vascular: No hyperdense vessel or unexpected calcification. Skull: Normal. Negative for fracture or focal lesion. Sinuses/Orbits: No acute finding. Other: None. ASPECTS [facility] Stroke Program Early CT Score) - Ganglionic level infarction (caudate, lentiform nuclei, internal capsule, insula, M1-M3 cortex): 7/7 - Supraganglionic infarction (M4-M6 cortex): 3/3 Total score (0-10 with 10 being normal): 10 IMPRESSION: No acute intracranial findings. Hypodensity of the periventricular white matter likely related to chronic small vessel ischemia. *ASPECTS is 10 *Results communicated to Dr. Leonel Ramsay via pager system. Electronically Signed   By: Erven Colla  Karenann Cai M.D.   On: 09/16/2019 13:07   ECHOCARDIOGRAM LIMITED  Result Date: 09/18/2019    ECHOCARDIOGRAM LIMITED REPORT   Patient Name:   [name] Date of Exam: 09/18/2019 Medical Rec #:  [id number]      Height:       65.0 in Accession #:    [id number]     Weight:       106.0 lb Date of Birth:  October 14, 1935      BSA:          1.511 m Patient Age:    84 years       BP:           141/83 mmHg Patient Gender: F              HR:           114 bpm. Exam  Location:  Inpatient Procedure: Limited Echo, Cardiac Doppler and Color Doppler Indications:    Stroke 434.91/I163.9  History:        Patient has prior history of Echocardiogram examinations, most                 recent 09/03/2019. CHF, COPD; Risk Factors:Diabetes, Hypertension                 and Sleep Apnea.  Sonographer:    Clayton Lefort RDCS (AE) Referring Phys: [id number] AVA SWAYZE IMPRESSIONS  1. Left ventricular ejection fraction, by estimation, is >75%. Left ventricular ejection fraction by PLAX is 86 %. The left ventricle has hyperdynamic function. The left ventricle has no regional wall motion abnormalities. There is severe asymmetric left ventricular hypertrophy of the basal-septal segment.  2. Right ventricular systolic function is normal. The right ventricular size is normal. There is normal pulmonary artery systolic pressure.  3. The mitral valve is normal in structure and function. No evidence of mitral valve regurgitation. No evidence of mitral stenosis.  4. The aortic valve is tricuspid. Aortic valve regurgitation is not visualized. No aortic stenosis is present.  5. The inferior vena cava is normal in size with greater than 50% respiratory variability, suggesting right atrial pressure of 3 mmHg. FINDINGS  Left Ventricle: Severe hypertrophy of the basal anteroseptum and mild LVH of the posterior myocardium. Intracavitary gradient noted. Peak velocity 1.75 m/s. Peak gradient 12.3 mmHg. Findings consistent with hypertrophic cardiomyopathy. Left ventricular ejection fraction, by estimation, is >75%. Left ventricular ejection fraction by PLAX is 86 % The left ventricle has hyperdynamic function. The left ventricle has no regional wall motion abnormalities. The left ventricular internal cavity size was normal  in size. There is severe asymmetric left ventricular hypertrophy of the basal-septal segment. Right Ventricle: The right ventricular size is normal. No increase in right ventricular wall thickness. Right  ventricular systolic function is normal. There is normal pulmonary artery systolic pressure. The tricuspid regurgitant velocity is 2.33 m/s, and  with an assumed right atrial pressure of 3 mmHg, the estimated right ventricular systolic pressure is [id number] mmHg. Left Atrium: Left atrial size was normal in size. Right Atrium: Right atrial size was normal in size. Pericardium: 0.688 cm posterior pericardial effusion. A small pericardial effusion is present. Mitral Valve: The mitral valve is normal in structure and function. Normal mobility of the mitral valve leaflets. No evidence of mitral valve stenosis. Tricuspid Valve: The tricuspid valve is normal in structure. Tricuspid valve regurgitation is trivial. No evidence of tricuspid stenosis. Aortic Valve: The aortic valve is tricuspid. Aortic valve regurgitation is not  visualized. No aortic stenosis is present. Pulmonic Valve: The pulmonic valve was normal in structure. Pulmonic valve regurgitation is not visualized. No evidence of pulmonic stenosis. Aorta: The aortic root is normal in size and structure. Venous: The inferior vena cava is normal in size with greater than 50% respiratory variability, suggesting right atrial pressure of 3 mmHg. IAS/Shunts: No atrial level shunt detected by color flow Doppler.  LEFT VENTRICLE PLAX 2D LV EF:         Left ventricular ejection fraction by PLAX is 86 % LVIDd:         2.80 cm LVIDs:         1.30 cm LV PW:         1.30 cm LV IVS:        1.70 cm LVOT diam:     1.90 cm LVOT Area:     2.84 cm  IVC IVC diam: 1.20 cm LEFT ATRIUM         Index LA diam:    2.40 cm 1.59 cm/m   AORTA Ao Root diam: 2.55 cm TRICUSPID VALVE TR Peak grad:   21.7 mmHg TR Vmax:        233.00 cm/s  SHUNTS Systemic Diam: 1.90 cm Skeet Latch MD Electronically signed by Skeet Latch MD Signature Date/Time: 09/18/2019/3:39:25 PM    Final     Microbiology: Recent Results (from the past 240 hour(s))  Urine culture     Status: Abnormal   Collection Time:  09/28/19  7:19 AM   Specimen: Urine, Random  Result Value Ref Range Status   Specimen Description URINE, RANDOM  Final   Special Requests   Final    NONE Performed at [facility], [address], [place], [place] [address]    Culture (A)  Final    >=100,000 COLONIES/mL ENTEROBACTER AEROGENES MULTI-DRUG RESISTANT ORGANISM    Report Status 10/02/2019 FINAL  Final   Organism ID, Bacteria ENTEROBACTER AEROGENES (A)  Final      Susceptibility   Enterobacter aerogenes - MIC*    CEFAZOLIN >=64 RESISTANT Resistant     CEFTRIAXONE >=64 RESISTANT Resistant     CIPROFLOXACIN <=0.25 SENSITIVE Sensitive     GENTAMICIN <=1 SENSITIVE Sensitive     IMIPENEM 1 SENSITIVE Sensitive     NITROFURANTOIN 128 RESISTANT Resistant     TRIMETH/SULFA <=20 SENSITIVE Sensitive     PIP/TAZO >=128 RESISTANT Resistant     * >=100,000 COLONIES/mL ENTEROBACTER AEROGENES  Carbapenem Resistance Panel     Status: None   Collection Time: 09/28/19  7:19 AM  Result Value Ref Range Status   Carba Resistance IMP Gene NOT DETECTED NOT DETECTED Final   Carba Resistance VIM Gene NOT DETECTED NOT DETECTED Final   Carba Resistance NDM Gene NOT DETECTED NOT DETECTED Final   Carba Resistance KPC Gene NOT DETECTED NOT DETECTED Final   Carba Resistance OXA48 Gene NOT DETECTED NOT DETECTED Final    Comment: (NOTE) Cepheid Carba-R is an FDA-cleared nucleic acid amplification test  (NAAT)for the detection and differentiation of genes encoding the  most prevalent carbapenemases in bacterial isolate samples. Carbapenemase gene identification and implementation of comprehensive  infection control measures are recommended by the CDC to prevent the  spread of the resistant organisms. Performed at [facility], [place] [address] [place], [place] [address]   SARS CORONAVIRUS 2 (TAT 6-24 HRS) Nasopharyngeal Nasopharyngeal Swab     Status: None   Collection Time: 09/28/19 12:23 PM   Specimen: Nasopharyngeal  Swab  Result  Value Ref Range Status   SARS Coronavirus 2 NEGATIVE NEGATIVE Final    Comment: (NOTE) SARS-CoV-2 target nucleic acids are NOT DETECTED. The SARS-CoV-2 RNA is generally detectable in upper and lower respiratory specimens during the acute phase of infection. Negative results do not preclude SARS-CoV-2 infection, do not rule out co-infections with other pathogens, and should not be used as the sole basis for treatment or other patient management decisions. Negative results must be combined with clinical observations, patient history, and epidemiological information. The expected result is Negative. Fact Sheet for Patients: SugarRoll.be Fact Sheet for Healthcare Providers: https://www.woods-mathews.com/ This test is not yet approved or cleared by the [place] FDA and  has been authorized for detection and/or diagnosis of SARS-CoV-2 by FDA under an Emergency Use Authorization (EUA). This EUA will remain  in effect (meaning this test can be used) for the duration of the COVID-19 declaration under Section 56 4(b)(1) of the Act, 21 U.S.C. section 360bbb-3(b)(1), unless the authorization is terminated or revoked sooner. Performed at [facility], [place] [address] [place], [place] [address]   SARS CORONAVIRUS 2 (TAT 6-24 HRS) Nasopharyngeal Nasopharyngeal Swab     Status: None   Collection Time: 10/01/19  2:11 PM   Specimen: Nasopharyngeal Swab  Result Value Ref Range Status   SARS Coronavirus 2 NEGATIVE NEGATIVE Final    Comment: (NOTE) SARS-CoV-2 target nucleic acids are NOT DETECTED. The SARS-CoV-2 RNA is generally detectable in upper and lower respiratory specimens during the acute phase of infection. Negative results do not preclude SARS-CoV-2 infection, do not rule out co-infections with other pathogens, and should not be used as the sole basis for treatment or other patient management decisions. Negative results must be combined  with clinical observations, patient history, and epidemiological information. The expected result is Negative. Fact Sheet for Patients: SugarRoll.be Fact Sheet for Healthcare Providers: https://www.woods-mathews.com/ This test is not yet approved or cleared by the [place] FDA and  has been authorized for detection and/or diagnosis of SARS-CoV-2 by FDA under an Emergency Use Authorization (EUA). This EUA will remain  in effect (meaning this test can be used) for the duration of the COVID-19 declaration under Section 56 4(b)(1) of the Act, 21 U.S.C. section 360bbb-3(b)(1), unless the authorization is terminated or revoked sooner. Performed at [facility], [place] [address], [place], [place] [address]   SARS CORONAVIRUS 2 (TAT 6-24 HRS) Nasopharyngeal Nasopharyngeal Swab     Status: None   Collection Time: 10/04/19  3:23 PM   Specimen: Nasopharyngeal Swab  Result Value Ref Range Status   SARS Coronavirus 2 NEGATIVE NEGATIVE Final    Comment: (NOTE) SARS-CoV-2 target nucleic acids are NOT DETECTED. The SARS-CoV-2 RNA is generally detectable in upper and lower respiratory specimens during the acute phase of infection. Negative results do not preclude SARS-CoV-2 infection, do not rule out co-infections with other pathogens, and should not be used as the sole basis for treatment or other patient management decisions. Negative results must be combined with clinical observations, patient history, and epidemiological information. The expected result is Negative. Fact Sheet for Patients: SugarRoll.be Fact Sheet for Healthcare Providers: https://www.woods-mathews.com/ This test is not yet approved or cleared by the [place] FDA and  has been authorized for detection and/or diagnosis of SARS-CoV-2 by FDA under an Emergency Use Authorization (EUA). This EUA will remain  in effect (meaning this test  can be used) for the duration of the COVID-19 declaration under Section 56 4(b)(1)  of the Act, 21 U.S.C. section 360bbb-3(b)(1), unless the authorization is terminated or revoked sooner. Performed at [facility], [place] [address], [place], [place] [address]      Labs: Basic Metabolic Panel: Recent Labs  Lab 09/29/19 [phone] 09/30/19 [phone] 10/02/19 0443 10/03/19 0238 10/04/19 0435 10/05/19 0441 10/05/19 1155  NA 138   < > 133* 131* 132* 129* 130*  K 3.1*   < > 3.2* 4.2 3.6 3.0* 3.5  CL 104   < > 99 102 100 99 99  CO2 25   < > 22 21* 24 22 22   GLUCOSE 120*   < > 104* 108* 111* 96 103*  BUN <5*   < > <5* <5* 5* <5* <5*  CREATININE 0.63   < > 0.90 0.97 1.01* 0.80 0.83  CALCIUM 8.4*   < > 8.9 8.4* 8.6* 8.0* 8.4*  MG 1.7  --   --  1.2* 2.9* 1.7  --    < > = values in this interval not displayed.   Liver Function Tests: Recent Labs  Lab 09/29/19 0605 10/01/19 0510  AST 49* 42*  ALT 24 22  ALKPHOS 48 43  BILITOT 0.3 0.4  PROT 5.8* 5.1*  ALBUMIN 2.8* 2.5*   No results for input(s): LIPASE, AMYLASE in the last 168 hours. No results for input(s): AMMONIA in the last 168 hours. CBC: Recent Labs  Lab 09/29/19 0605 10/02/19 0443  WBC 4.5 5.1  HGB 11.1* 10.7*  HCT 35.1* 33.4*  MCV 91.9 91.5  PLT 279 231   Cardiac Enzymes: No results for input(s): CKTOTAL, CKMB, CKMBINDEX, TROPONINI in the last 168 hours. BNP: BNP (last 3 results) Recent Labs    09/06/19 1124  BNP 45.5    ProBNP (last 3 results) No results for input(s): PROBNP in the last 8760 hours.  CBG: Recent Labs  Lab 10/04/19 1150 10/04/19 1637 10/04/19 2109 10/05/19 0635 10/05/19 1111  GLUCAP 116* 104* 103* 85 91       Signed:  Isma Tietje  [facility] 10/05/2019, 1:50 PM

## 2019-10-04 NOTE — Progress Notes (Signed)
PROGRESS NOTE    [name]  [id number] DOB: 09-03-1935 DOA: 09/28/2019 PCP: Vicki Mattocks, PA-C   Brief Narrative:  HPI on 09/28/2019 by Dr. Fuller Plan [name] is a 84 y.o. 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  She presents after being found to be acutely altered. Husband had called EMS this morning because she was disoriented and was incontinent of urine.   Patient had been being treated for a UTI as an outpatient on cephalexin, but review of records shows cultures from 2/24 were noted to be resistant.  The patient's primary care provider had called in a prescription for ciprofloxacin yesterday, but family had not been able to pick it up yet.  Since the patient had been discharged home from the hospital on 2/21, family reports that she had not been eating or drinking much.  Patient denies having any complaints of pain or shortness of breath.  Interim history Admitted for AMS. Found to have Enterobacter UTI, currently on ciprofloxacin and the days.  Assessment & Plan   Acute metabolic encephalopathy -possibly secondary to UTI vs failure to thrive and poor oral intake  -unknown baseline- have tried to reach out husband  -MRI brain: No evidence of acute intercranial normality.  Expected evolution of known small subacute infarct within the posterior left frontal lobe. -upon review of chart, patient has had confusion since previous admission -Appears to be calmer this morning  Acute Enterobacter aerogenes UTI and urinary retention -Urine culture from 10/17/2019 showed Enterobacter aerogenes -Urine culture from 09/22/2019 showed Klebsiella originates, resistance to cephalosporins -PCP had tried to start patient on ciprofloxacin however patient's family was unable to obtain medication -Currently  on IV ciprofloxacin -Urine culture shows multidrug resistant organism, however sensitive to ciprofloxacin -Patient with suprapubic pain, obtained x-ray which showed no acute abdominal abnormality.  Possible pelvic floor laxity. -Patient continues to complain of pelvic/groin pain -Obtain CT abdomen pelvis which showed normal appearance of bilateral kidneys and renal collecting system.  No urinary obstruction or pyelonephritis. -Patient now with urinary retention, this morning had over 700 cc, this afternoon 400 cc -If patient continues to retain, will place Foley catheter -Started patient on Flomax  Essential hypertension and tachycardia -BP stable  -Continue metoprolol  Acute hyponatremia -sodium down to 132 -will continue to monitor   Hypokalemia -resolved with replacement  Hypomagnesemia -Resolved with replacement  Failure to thrive/underweight -BMI of 17.37 -At home, patient reported not to be eating or drinking much -Suspect symptoms to be secondary to UTI -Continue supplements -Continue on IVF due to poor oral intake -Have added low-dose Remeron  Recent CVA -Patient suffered a stroke, left posterior frontal lobe on 09/16/2019 -Follow-up with neurology as an outpatient  -Continue Plavix and aspirin  Normocytic anemia -hemoglobin stable, continue to monitor  Diabetes mellitus, type II -hemoglobin A1c 6.2 on 09/17/2019 -Not on any diabetic medications at home, likely diet controlled -Continue insulin sliding scale with CBG monitoring  Hyperlipidemia -Recent lipid panel from 09/17/2019 showed total cholesterol 106, HDL 34, LDL 34, triglycerides 190 -Continue statin  Elevated AST -Acute on chronic -AST elevated to 42 (was higher in Jan 2021) -Continue to monitor   Deconditioning -PT recommended home health -Recommended home health, if patient's family unable to provide 24/7 assist may need SNF.  3 and 1 bedside commode.  Anxiety -Started patient on low-dose  Ativan which seems to have helped  DVT Prophylaxis Lovenox  Code Status: DNR  Family Communication: None at bedside.  Husband via phone  Disposition Plan: Admitted from home for acute metabolic encephalopathy and UTI.  Patient now with urinary retention.  Suspect patient can be discharged to SNF on 10/05/2019, will have to obtain repeat Covid test.  Consultants Neurology   Procedures  None  Antibiotics   Anti-infectives (From admission, onward)   Start     Dose/Rate Route Frequency Ordered Stop   09/28/19 2000  ciprofloxacin (CIPRO) IVPB 400 mg     400 mg 200 mL/hr over 60 Minutes Intravenous Every 12 hours 09/28/19 1141     09/28/19 0745  ciprofloxacin (CIPRO) IVPB 400 mg     400 mg 200 mL/hr over 60 Minutes Intravenous  Once 09/28/19 0741 09/28/19 0930      Subjective:   [name] seen and examined today.  Denies groin pain this morning.  Denies current chest pain or shortness of breath, abdominal pain, nausea or vomiting, dizziness or headache.  Just feels tired.  Objective:   Vitals:   10/03/19 2343 10/04/19 0417 10/04/19 0844 10/04/19 1147  BP: (!) 144/79 (!) 144/70 131/66 131/77  Pulse: 78 82 91 84  Resp: 18 18 18 18   Temp: 98.7 F (37.1 C) 97.7 F (36.5 C) 99.5 F (37.5 C) 98.7 F (37.1 C)  TempSrc: Oral Oral Oral Oral  SpO2: 100% 98% 99% 98%  Weight:      Height:        Intake/Output Summary (Last 24 hours) at 10/04/2019 1521 Last data filed at 10/04/2019 1400 Gross per 24 hour  Intake 5316.23 ml  Output 1692 ml  Net 3624.23 ml   Filed Weights   09/28/19 1723  Weight: 51.9 kg   Exam  General: Well developed, chronically ill-appearing, NAD  HEENT: NCAT, mucous membranes moist.   Cardiovascular: S1 S2 auscultated, RRR, no murmur  Respiratory: Clear to auscultation bilaterally  Abdomen: Soft, nontender, nondistended, + bowel sounds  Extremities: warm dry without cyanosis clubbing or edema  Neuro: AAOx2 (self, place) nonfocal  Psych:  appropriate mood and affect   Data Reviewed: I have personally reviewed following labs and imaging studies  CBC: Recent Labs  Lab 09/28/19 0611 09/28/19 0615 09/29/19 0605 10/02/19 0443  WBC 5.6  --  4.5 5.1  NEUTROABS 2.5  --   --   --   HGB 11.4* 12.2 11.1* 10.7*  HCT 36.6 36.0 35.1* 33.4*  MCV 93.4  --  91.9 91.5  PLT 263  --  279 [id number]   Basic Metabolic Panel: Recent Labs  Lab 09/29/19 0605 09/29/19 0605 09/30/19 0212 10/01/19 0510 10/02/19 0443 10/03/19 0238 10/04/19 0435  NA 138   < > 137 132* 133* 131* 132*  K 3.1*   < > 3.9 3.7 3.2* 4.2 3.6  CL 104   < > 106 99 99 102 100  CO2 25   < > 22 23 22  21* 24  GLUCOSE 120*   < > 103* 131* 104* 108* 111*  BUN <5*   < > <5* 5* <5* <5* 5*  CREATININE 0.63   < > 0.61 0.59 0.90 0.97 1.01*  CALCIUM 8.4*   < > 8.5* 8.8* 8.9 8.4* 8.6*  MG 1.7  --   --   --   --  1.2* 2.9*   < > = values in this interval not displayed.   GFR: Estimated Creatinine Clearance: 35.8 mL/min (A) (by C-G formula based on SCr of 1.01  mg/dL (H)). Liver Function Tests: Recent Labs  Lab 09/28/19 0611 09/29/19 0605 10/01/19 0510  AST 43* 49* 42*  ALT 23 24 22   ALKPHOS 53 48 43  BILITOT 0.6 0.3 0.4  PROT 6.2* 5.8* 5.1*  ALBUMIN 3.0* 2.8* 2.5*   No results for input(s): LIPASE, AMYLASE in the last 168 hours. No results for input(s): AMMONIA in the last 168 hours. Coagulation Profile: Recent Labs  Lab 09/28/19 0611  INR 1.0   Cardiac Enzymes: No results for input(s): CKTOTAL, CKMB, CKMBINDEX, TROPONINI in the last 168 hours. BNP (last 3 results) No results for input(s): PROBNP in the last 8760 hours. HbA1C: No results for input(s): HGBA1C in the last 72 hours. CBG: Recent Labs  Lab 10/03/19 1110 10/03/19 1529 10/03/19 2117 10/04/19 0620 10/04/19 1150  GLUCAP 107* 86 89 110* 116*   Lipid Profile: No results for input(s): CHOL, HDL, LDLCALC, TRIG, CHOLHDL, LDLDIRECT in the last 72 hours. Thyroid Function Tests: No results for  input(s): TSH, T4TOTAL, FREET4, T3FREE, THYROIDAB in the last 72 hours. Anemia Panel: No results for input(s): VITAMINB12, FOLATE, FERRITIN, TIBC, IRON, RETICCTPCT in the last 72 hours. Urine analysis:    Component Value Date/Time   COLORURINE YELLOW 09/28/2019 0719   APPEARANCEUR HAZY (A) 09/28/2019 0719   LABSPEC 1.004 (L) 09/28/2019 0719   PHURINE 6.0 09/28/2019 0719   GLUCOSEU NEGATIVE 09/28/2019 0719   HGBUR NEGATIVE 09/28/2019 0719   BILIRUBINUR NEGATIVE 09/28/2019 0719   BILIRUBINUR negative 09/22/2019 1541   KETONESUR NEGATIVE 09/28/2019 0719   PROTEINUR NEGATIVE 09/28/2019 0719   UROBILINOGEN 0.2 09/22/2019 1541   UROBILINOGEN 2.0 (H) 08/13/2019 1542   NITRITE NEGATIVE 09/28/2019 0719   LEUKOCYTESUR LARGE (A) 09/28/2019 0719   Sepsis Labs: @LABRCNTIP (procalcitonin:4,lacticidven:4)  ) Recent Results (from the past 240 hour(s))  Urine culture     Status: Abnormal   Collection Time: 09/28/19  7:19 AM   Specimen: Urine, Random  Result Value Ref Range Status   Specimen Description URINE, RANDOM  Final   Special Requests   Final    NONE Performed at [facility], [place] [address] [place], [place] [address]    Culture (A)  Final    >=100,000 COLONIES/mL ENTEROBACTER AEROGENES MULTI-DRUG RESISTANT ORGANISM    Report Status 10/02/2019 FINAL  Final   Organism ID, Bacteria ENTEROBACTER AEROGENES (A)  Final      Susceptibility   Enterobacter aerogenes - MIC*    CEFAZOLIN >=64 RESISTANT Resistant     CEFTRIAXONE >=64 RESISTANT Resistant     CIPROFLOXACIN <=0.25 SENSITIVE Sensitive     GENTAMICIN <=1 SENSITIVE Sensitive     IMIPENEM 1 SENSITIVE Sensitive     NITROFURANTOIN 128 RESISTANT Resistant     TRIMETH/SULFA <=20 SENSITIVE Sensitive     PIP/TAZO >=128 RESISTANT Resistant     * >=100,000 COLONIES/mL ENTEROBACTER AEROGENES  Carbapenem Resistance Panel     Status: None   Collection Time: 09/28/19  7:19 AM  Result Value Ref Range Status   Carba Resistance IMP  Gene NOT DETECTED NOT DETECTED Final   Carba Resistance VIM Gene NOT DETECTED NOT DETECTED Final   Carba Resistance NDM Gene NOT DETECTED NOT DETECTED Final   Carba Resistance KPC Gene NOT DETECTED NOT DETECTED Final   Carba Resistance OXA48 Gene NOT DETECTED NOT DETECTED Final    Comment: (NOTE) Cepheid Carba-R is an FDA-cleared nucleic acid amplification test  (NAAT)for the detection and differentiation of genes encoding the  most prevalent carbapenemases in bacterial isolate samples. Carbapenemase  gene identification and implementation of comprehensive  infection control measures are recommended by the CDC to prevent the  spread of the resistant organisms. Performed at [facility], [place] [address], [place], [place] [address]   SARS CORONAVIRUS 2 (TAT 6-24 HRS) Nasopharyngeal Nasopharyngeal Swab     Status: None   Collection Time: 09/28/19 12:23 PM   Specimen: Nasopharyngeal Swab  Result Value Ref Range Status   SARS Coronavirus 2 NEGATIVE NEGATIVE Final    Comment: (NOTE) SARS-CoV-2 target nucleic acids are NOT DETECTED. The SARS-CoV-2 RNA is generally detectable in upper and lower respiratory specimens during the acute phase of infection. Negative results do not preclude SARS-CoV-2 infection, do not rule out co-infections with other pathogens, and should not be used as the sole basis for treatment or other patient management decisions. Negative results must be combined with clinical observations, patient history, and epidemiological information. The expected result is Negative. Fact Sheet for Patients: SugarRoll.be Fact Sheet for Healthcare Providers: https://www.woods-mathews.com/ This test is not yet approved or cleared by the [place] FDA and  has been authorized for detection and/or diagnosis of SARS-CoV-2 by FDA under an Emergency Use Authorization (EUA). This EUA will remain  in effect (meaning this test can be used)  for the duration of the COVID-19 declaration under Section 56 4(b)(1) of the Act, 21 U.S.C. section 360bbb-3(b)(1), unless the authorization is terminated or revoked sooner. Performed at [facility], [place] [address], [place], [place] [address]   SARS CORONAVIRUS 2 (TAT 6-24 HRS) Nasopharyngeal Nasopharyngeal Swab     Status: None   Collection Time: 10/01/19  2:11 PM   Specimen: Nasopharyngeal Swab  Result Value Ref Range Status   SARS Coronavirus 2 NEGATIVE NEGATIVE Final    Comment: (NOTE) SARS-CoV-2 target nucleic acids are NOT DETECTED. The SARS-CoV-2 RNA is generally detectable in upper and lower respiratory specimens during the acute phase of infection. Negative results do not preclude SARS-CoV-2 infection, do not rule out co-infections with other pathogens, and should not be used as the sole basis for treatment or other patient management decisions. Negative results must be combined with clinical observations, patient history, and epidemiological information. The expected result is Negative. Fact Sheet for Patients: SugarRoll.be Fact Sheet for Healthcare Providers: https://www.woods-mathews.com/ This test is not yet approved or cleared by the [place] FDA and  has been authorized for detection and/or diagnosis of SARS-CoV-2 by FDA under an Emergency Use Authorization (EUA). This EUA will remain  in effect (meaning this test can be used) for the duration of the COVID-19 declaration under Section 56 4(b)(1) of the Act, 21 U.S.C. section 360bbb-3(b)(1), unless the authorization is terminated or revoked sooner. Performed at [facility], [place] [address], [place], [place] [address]       Radiology Studies: CT ABDOMEN PELVIS W CONTRAST  Result Date: 10/03/2019 CLINICAL DATA:  Complicated UTI. EXAM: CT ABDOMEN AND PELVIS WITH CONTRAST TECHNIQUE: Multidetector CT imaging of the abdomen and pelvis was performed using the  standard protocol following bolus administration of intravenous contrast. CONTRAST:  136mL OMNIPAQUE IOHEXOL 300 MG/ML  SOLN COMPARISON:  09/01/2019; 08/13/2019 FINDINGS: Lower chest: Limited visualization of the lower thorax demonstrates minimal subsegmental atelectasis within the right lower lobe and lingula. No discrete focal airspace opacities. No pleural effusion. Cardiomegaly. Small pericardial effusion, increased compared to the 09/01/2019 examination. Hepatobiliary: Normal hepatic contour. There is diffuse decreased attenuation of the hepatic parenchyma on this postcontrast examination suggestive of hepatic steatosis. Post cholecystectomy. No intra  or extrahepatic biliary ductal dilatation. No ascites. Pancreas: The pancreatic duct appears mildly dilated measuring 3 mm in diameter (image 26, series 3), however this is without associated downstream pancreatic atrophy or peripancreatic stranding. No discrete pancreatic lesion. Spleen: Normal appearance of the spleen. Adrenals/Urinary Tract: There is symmetric enhancement and excretion of the bilateral kidneys. No definite renal stones on this postcontrast examination. No discrete renal lesions. No urinary obstruction or perinephric stranding. Normal appearance the bilateral adrenal glands. Normal appearance of the urinary bladder given degree distention. Stomach/Bowel: The approximately 1.9 x 1.8 cm calcification within the left upper abdominal quadrant likely represents radiopaque debris within a gastric diverticulum (image 14, series 3; coronal image 30, series 5) and is grossly unchanged compared to the 01/2015 examination. Normal appearance of the terminal ileum. The appendix is not visualized, however there is no pericecal inflammatory change. No discrete areas of bowel wall thickening. No evidence of enteric obstruction. No pneumoperitoneum, pneumatosis or portal venous gas. Vascular/Lymphatic: Scattered calcified atherosclerotic plaque within a normal  caliber abdominal aorta, not resulting in a hemodynamically significant stenosis. The major branch vessels of the abdominal aorta appear patent on this non CTA examination. No bulky retroperitoneal, mesenteric, pelvic or inguinal lymphadenopathy. Reproductive: Post hysterectomy. Dystrophic left-sided adnexal calcifications without discrete nodule or mass. Normal appearance of the right adnexa. No free fluid the pelvic cul-de-sac. Other: Minimal amount diffuse body wall anasarca. Gluteal calcifications are seen bilaterally. Musculoskeletal: No acute or aggressive osseous abnormalities. Sequela of previous cement augmentation of the T11 and T12 vertebral bodies mild (approximately 25%) compression deformities involving the L3 and L4 vertebral bodies appears similar to the 08/2018 examination. Mild-to-moderate multilevel lumbar spine DDD, worse at L1-L2, L2-L3 and L4-L5 with disc space height loss, endplate irregularity and sclerosis. IMPRESSION: 1. Normal appearance of the bilateral kidneys and renal collecting system. Specifically, no evidence of urinary obstruction or pyelonephritis. 2. Cardiomegaly. Small pericardial effusion, increased in size compared to the 09/01/2019 examination. Further evaluation with cardiac echo could be performed as indicated. 3. The approximately 1.9 x 1.8 cm calcification within the left upper abdominal quadrant likely represents radiopaque debris within a gastric diverticulum, is grossly unchanged compared to the 01/2015 examination and of doubtful clinical concern. 4.  Aortic Atherosclerosis (ICD10-I70.0). 5. Suspected hepatic steatosis.  Correlation with LFTs is advised. Electronically Signed   By: Sandi Mariscal M.D.   On: 10/03/2019 10:50     Scheduled Meds: . aspirin EC  81 mg Oral Q M,W,F  . atorvastatin  20 mg Oral Daily  . clopidogrel  75 mg Oral Daily  . docusate sodium  100 mg Oral BID  . enoxaparin (LOVENOX) injection  40 mg Subcutaneous Q24H  . feeding supplement  (GLUCERNA SHAKE)  237 mL Oral TID BM  . folic acid  1 mg Oral Daily  . insulin aspart  0-6 Units Subcutaneous TID WC  . metoCLOPramide  5 mg Oral TID AC  . metoprolol tartrate  50 mg Oral BID  . mirtazapine  7.5 mg Oral QHS  . mometasone-formoterol  2 puff Inhalation BID  . sodium chloride flush  3 mL Intravenous Q12H  . tamsulosin  0.4 mg Oral Daily  . cyanocobalamin  1,000 mcg Oral Daily   Continuous Infusions: . sodium chloride 50 mL/hr at 10/03/19 1929  . ciprofloxacin 400 mg (10/04/19 1151)     LOS: 6 days   Time Spent in minutes   30 minutes  Kabeer Hoagland D.O. on 10/04/2019 at 3:21 PM  Between 7am to 7pm -  Please see pager noted on [email or web address]  After 7pm go to [email or web address]  And look for the night coverage person covering for me after hours  [facility] Office  [phone]

## 2019-10-04 NOTE — Progress Notes (Signed)
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 Presents after being found acutely altered.  Treated for UTI as outpatient (but primary care called in new antibiotic yesterday). Admitted for acute encephalopathy, anticipate from untreated UTI.     PT Comments    Pt making steady progress with PT towards goals. Pt continues to require min assist to perform bed mobility and transfer with RW. Pt willing to participate in session however after stand pt declined to ambulate in room. She performed side stepping at EOB and min assist required for walker management. Pt with some confusion and impaired memory noted as she was questioning when someone could help her to the bathroom however declined to go to the bathroom with PT. Prior to session pt had undergone I&O cath however seemed to still be focused on goal of going to the bathroom today. Acute PT will continue to progress her as able, she will benefit from skilled PT follow up at SNF level.    Follow Up Recommendations  Supervision/Assistance - 24 hour;SNF     Equipment Recommendations  None recommended by PT    Recommendations for Other Services       Precautions / Restrictions Precautions Precautions: Fall Precaution Comments: impaired vision Restrictions Weight Bearing Restrictions: No    Mobility  Bed Mobility Overal bed mobility: Needs Assistance Bed Mobility: Supine to Sit     Supine to sit: Min assist     General bed mobility comments: increased time for sequencing bringing LE's off EOB. Assist for raising trunk to sit EOB.   Transfers Overall transfer level: Needs assistance Equipment used: Rolling walker (2 wheeled) Transfers: Sit to/from  Stand Sit to Stand: Min assist         General transfer comment: Min assist to complete power up and rise to walker. Pt performed 2x and requried cues for safe hand plcement/technique with RW.  Ambulation/Gait Ambulation/Gait assistance: Min assist Gait Distance (Feet): 4 Feet Assistive device: Rolling walker (2 wheeled)   Gait velocity: decreased   General Gait Details: Min assist for management of RW. Pt performed side stepping at EOB and declined to ambulate in room this session requesting to get back to bed despite education on benefits of mobilizing.    Stairs             Wheelchair Mobility    Modified Rankin (Stroke Patients Only)       Balance Overall balance assessment: Needs assistance Sitting-balance support: Single extremity supported;Feet supported Sitting balance-Leahy Scale: Fair   Postural control: Posterior lean Standing balance support: Bilateral upper extremity supported;During functional activity Standing balance-Leahy Scale: Poor Standing balance comment: reliant on external support           Cognition Arousal/Alertness: Awake/alert Behavior During Therapy: Flat affect Overall Cognitive Status: Impaired/Different from baseline        Memory: Decreased short-term memory Following Commands: Follows one step commands inconsistently;Follows one step commands with increased time Safety/Judgement: Decreased awareness of safety;Decreased awareness of deficits   Problem Solving: Slow processing;Difficulty sequencing;Requires verbal cues;Requires tactile cues General Comments: pt oriented to self and place. Oriented to year but unable to state month correctly. Pt perseverating on need to go to the bathroom despite just have undergone I&O cath.  Exercises      General Comments        Pertinent Vitals/Pain Pain Assessment: Faces Faces Pain Scale: No hurt Pain Intervention(s): Monitored during session           PT Goals  (current goals can now be found in the care plan section) Acute Rehab PT Goals Patient Stated Goal: to go home PT Goal Formulation: With patient Time For Goal Achievement: 10/13/19 Potential to Achieve Goals: Fair Progress towards PT goals: Progressing toward goals(slow)    Frequency    Min 2X/week      PT Plan Current plan remains appropriate       AM-PAC PT "6 Clicks" Mobility   Outcome Measure  Help needed turning from your back to your side while in a flat bed without using bedrails?: None Help needed moving from lying on your back to sitting on the side of a flat bed without using bedrails?: A Little Help needed moving to and from a bed to a chair (including a wheelchair)?: A Little Help needed standing up from a chair using your arms (e.g., wheelchair or bedside chair)?: A Little Help needed to walk in hospital room?: A Little Help needed climbing 3-5 steps with a railing? : A Lot 6 Click Score: 18    End of Session Equipment Utilized During Treatment: Gait belt Activity Tolerance: Patient limited by fatigue Patient left: with call bell/phone within reach;in bed;with bed alarm set Nurse Communication: Mobility status PT Visit Diagnosis: Muscle weakness (generalized) (M62.81);Difficulty in walking, not elsewhere classified (R26.2);History of falling (Z91.81)     Time: [id number] PT Time Calculation (min) (ACUTE ONLY): 18 min  Charges:  $Therapeutic Activity: 8-22 mins                    Verner Mould, DPT Physical Therapist with [facility] [phone]  10/04/2019 5:23 PM

## 2019-10-04 NOTE — Progress Notes (Addendum)
Bladder scan 776cc, [facility] informed. [place] informed following speaking with Madilyn Fireman (not covering this pt).

## 2019-10-04 NOTE — Progress Notes (Signed)
Attempted x2 earlier today to get pt to [facility] to void. Pt unable to void. Bladder scan Volume at 12pm 275. 1400. Bladder scan vol of 411ml. Transferred pt to [facility], unable to void. I&O cath obtained. 441ml of clear yellow urine obtained, no odor. Small specks of sediment noted. MD notified as pt was pending d/c today if able to void. Awaiting orders.

## 2019-10-04 NOTE — Progress Notes (Signed)
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 Presents after being found acutely altered.  Treated for UTI as outpatient (but primary care called in new antibiotic yesterday). Admitted for acute encephalopathy, anticipate from untreated UTI.    OT comments  Patient continues to make steady progress towards goals in skilled OT session. Patient's session encompassed functional transfers to [facility] as pt has not urinated since yesterday and has bladder retention. Pt complaining of headache upon arrival, and perseverative to on not being able to urinate. Pt willing to attempt to use BSC, however could not be convinced to sit up or participate further in functional ADL tasks. Pt remains dependent on cues for safety and sequencing, however with decreased agitation since session previous. Pt requesting to return back to bed after unsuccessful trip to [facility] (no urination) with RN notified for need of pain medication. Will continue to follow acutely.     Follow Up Recommendations  Home health OT;Supervision/Assistance - 24 hour    Equipment Recommendations  3 in 1 bedside commode    Recommendations for Other Services      Precautions / Restrictions Precautions Precautions: Fall Precaution Comments: impaired vision Restrictions Weight Bearing Restrictions: No       Mobility Bed Mobility Overal bed mobility: Needs Assistance Bed Mobility: Supine to Sit     Supine to sit: Min assist     General bed mobility comments: Min A needed at trunk to sit upright  Transfers Overall transfer level: Needs assistance Equipment used: Rolling walker (2 wheeled) Transfers: Sit to/from [facility] Sit to  Stand: Min assist Stand pivot transfers: Min assist       General transfer comment: MinA to stand from edge of bed and BSC, pt preferring to pull up from walker. Pt required max cues for safety and sequencing and increased posterior lean noted    Balance Overall balance assessment: Needs assistance Sitting-balance support: Single extremity supported;Feet supported Sitting balance-Leahy Scale: Fair   Postural control: Posterior lean Standing balance support: Bilateral upper extremity supported;During functional activity Standing balance-Leahy Scale: Poor Standing balance comment: reliant on external support and 1 LOB when completing stand pivot transfers to [facility] in session                           ADL either performed or assessed with clinical judgement   ADL Overall ADL's : Needs assistance/impaired     Grooming: Sitting;Set up;Min guard                   Toilet Transfer: Minimal assistance;Ambulation;RW;BSC;Cueing for sequencing Toilet Transfer Details (indicate cue type and reason): increased posterior lean and shuffled gait in session Toileting- Clothing Manipulation and Hygiene: Total assistance;Cueing for safety;Cueing for sequencing Toileting - Clothing Manipulation Details (indicate cue type and reason): Total assist from [facility] due to decreased cognition       General ADL Comments: pt limited by fatigue and pain, (complaining of headache) motivated to try and use BSC in session     Vision       Perception     Praxis      Cognition Arousal/Alertness: Awake/alert Behavior During Therapy: Flat affect;Impulsive Overall Cognitive Status: Impaired/Different from baseline Area of  Impairment: Attention;Safety/judgement;Problem solving;Awareness;Orientation;Memory;Following commands                   Current Attention Level: Sustained Memory: Decreased short-term memory Following Commands: Follows one step commands inconsistently;Follows one  step commands with increased time Safety/Judgement: Decreased awareness of safety;Decreased awareness of deficits Awareness: Intellectual Problem Solving: Difficulty sequencing;Requires verbal cues;Slow processing;Decreased initiation;Requires tactile cues General Comments: pt oriented x4, perseverative on the fact she has not been able to urinate and what could the therapist do about it        Exercises     Shoulder Instructions       General Comments      Pertinent Vitals/ Pain       Pain Assessment: Faces Faces Pain Scale: Hurts little more Pain Location: head (gesturing to the front) Pain Descriptors / Indicators: Grimacing;Aching;Headache Pain Intervention(s): Limited activity within patient's tolerance;Monitored during session;Repositioned;Relaxation;Patient requesting pain meds-RN notified  Home Living                                          Prior Functioning/Environment              Frequency  Min 2X/week        Progress Toward Goals  OT Goals(current goals can now be found in the care plan section)  Progress towards OT goals: Progressing toward goals  Acute Rehab OT Goals Patient Stated Goal: "to pee" OT Goal Formulation: With patient Time For Goal Achievement: 10/13/19 Potential to Achieve Goals: Good  Plan Discharge plan remains appropriate    Co-evaluation                 AM-PAC OT "6 Clicks" Daily Activity     Outcome Measure   Help from another person eating meals?: A Little Help from another person taking care of personal grooming?: A Little Help from another person toileting, which includes using toliet, bedpan, or urinal?: A Lot Help from another person bathing (including washing, rinsing, drying)?: A Lot Help from another person to put on and taking off regular upper body clothing?: A Lot Help from another person to put on and taking off regular lower body clothing?: Total 6 Click Score: 13    End of Session  Equipment Utilized During Treatment: Gait belt;Rolling walker  OT Visit Diagnosis: Other abnormalities of gait and mobility (R26.89);Muscle weakness (generalized) (M62.81);Other symptoms and signs involving cognitive function;Cognitive communication deficit (R41.841) Pain - part of body: (Headache)   Activity Tolerance Patient limited by fatigue;Patient limited by lethargy   Patient Left in bed;with bed alarm set;with call bell/phone within reach   Nurse Communication Mobility status        Time: [id number] OT Time Calculation (min): 13 min  Charges: OT General Charges $OT Visit: 1 Visit OT Treatments $Self Care/Home Management : 8-22 mins  Corinne Ports E. Garyn Waguespack, COTA/L Acute Rehabilitation Services [phone] [place] 10/04/2019, 11:37 AM

## 2019-10-05 LAB — BASIC METABOLIC PANEL
Anion gap: 8 (ref 5–15)
Anion gap: 9 (ref 5–15)
BUN: <5 mg/dL — ABNORMAL LOW (ref 8–23)
BUN: <5 mg/dL — ABNORMAL LOW (ref 8–23)
CO2: 22 mmol/L (ref 22–32)
CO2: 22 mmol/L (ref 22–32)
Calcium: 8 mg/dL — ABNORMAL LOW (ref 8.9–10.3)
Calcium: 8.4 mg/dL — ABNORMAL LOW (ref 8.9–10.3)
Chloride: 99 mmol/L (ref 98–111)
Chloride: 99 mmol/L (ref 98–111)
Creatinine, Ser: 0.8 mg/dL (ref 0.44–1.00)
Creatinine, Ser: 0.83 mg/dL (ref 0.44–1.00)
GFR calc Af Amer: >60 mL/min (ref >60)
GFR calc Af Amer: >60 mL/min (ref >60)
GFR calc non Af Amer: >60 mL/min (ref >60)
GFR calc non Af Amer: >60 mL/min (ref >60)
Glucose, Bld: 96 mg/dL (ref 70–99)
Glucose, Bld: 103 mg/dL — ABNORMAL HIGH (ref 70–99)
Potassium: 3 mmol/L — ABNORMAL LOW (ref 3.5–5.1)
Potassium: 3.5 mmol/L (ref 3.5–5.1)
Sodium: 129 mmol/L — ABNORMAL LOW (ref 135–145)
Sodium: 130 mmol/L — ABNORMAL LOW (ref 135–145)

## 2019-10-05 LAB — GLUCOSE, CAPILLARY
Glucose-Capillary: 85 mg/dL (ref 70–99)
Glucose-Capillary: 91 mg/dL (ref 70–99)

## 2019-10-05 LAB — MAGNESIUM: Magnesium: 1.7 mg/dL (ref 1.7–2.4)

## 2019-10-05 MED ORDER — MIRTAZAPINE 15 MG PO TBDP: 7.5000 mg | ORAL_TABLET | Freq: Every day | ORAL | Status: AC

## 2019-10-05 MED ORDER — FUROSEMIDE 10 MG/ML IJ SOLN
20.0000 mg | Freq: Once | INTRAMUSCULAR | Status: AC
  Administered 2019-10-05: 20 mg via INTRAVENOUS
  Filled 2019-10-05: qty 4

## 2019-10-05 MED ORDER — LORAZEPAM 0.5 MG PO TABS: 0.5000 mg | ORAL_TABLET | Freq: Four times a day (QID) | ORAL | 0 refills | Status: AC | PRN

## 2019-10-05 MED ORDER — TAMSULOSIN HCL 0.4 MG PO CAPS: 0.4000 mg | ORAL_CAPSULE | Freq: Every day | ORAL | Status: AC

## 2019-10-05 MED ORDER — METOPROLOL TARTRATE 50 MG PO TABS: 50.0000 mg | ORAL_TABLET | Freq: Two times a day (BID) | ORAL | Status: AC

## 2019-10-05 MED ORDER — POTASSIUM CHLORIDE CRYS ER 20 MEQ PO TBCR
40.0000 meq | EXTENDED_RELEASE_TABLET | Freq: Once | ORAL | Status: AC
  Administered 2019-10-05: 40 meq via ORAL
  Filled 2019-10-05: qty 2

## 2019-10-05 NOTE — Progress Notes (Signed)
Report called to [facility], EMS arrived to get pt. All belongings in bag at the bedside sent with pt.

## 2019-10-05 NOTE — TOC Transition Note (Signed)
Transition of Care [facility]) - CM/SW Discharge Note   Patient Details  Name: [name] MRN: [id number] Date of Birth: [birth year]  [facility] CM/SW Contact:  Pollie Friar, RN Phone Number: 10/05/2019, 2:07 PM   Clinical Narrative:    Pt discharging to [place] [facility] [facility] today. CM called and updated pts spouse: [name].  Bedside RN updated and d/c packet at the desk. Pt to transport via [facility].  Room; 114 Number for report: [phone]   Final next level of care: Skilled Nursing Facility Barriers to Discharge: No Barriers Identified   Patient Goals and CMS Choice   CMS Medicare.gov Compare Post Acute Care list provided to:: Patient Represenative (must comment) Choice offered to / list presented to : Spouse  Discharge Placement              Patient chooses bed at: ([facility]) Patient to be transferred to facility by: [place] Name of family member notified: Gwenlyn Perking Patient and family notified of of transfer: 10/05/19  Discharge Plan and Services In-house Referral: Clinical Social Work Discharge Planning Services: CM Consult Post Acute Care Choice: [place]                               Social Determinants of Health (SDOH) Interventions     Readmission Risk Interventions Readmission Risk Prevention Plan 09/02/2019  Transportation Screening Complete  Medication Review [employer]) Complete  PCP or Specialist appointment within 3-5 days of discharge Complete  [place]RI or [place] Complete  SW Recovery Care/Counseling Consult Complete  [place] Patient Refused  Some recent data might be hidden

## 2019-10-05 NOTE — Progress Notes (Signed)
Pt was urinating when she was coughing before doing a in and out cath. This is the third cath.

## 2019-10-05 NOTE — Discharge Instructions (Signed)

## 2019-10-06 ENCOUNTER — Ambulatory Visit: Payer: Medicare Other | Admitting: Cardiology

## 2019-10-08 ENCOUNTER — Telehealth: Payer: Self-pay

## 2019-10-11 ENCOUNTER — Telehealth: Payer: Self-pay

## 2019-10-11 ENCOUNTER — Other Ambulatory Visit: Payer: Self-pay | Admitting: *Deleted

## 2019-10-11 NOTE — Patient Outreach (Signed)
[facility]) Care Management  10/11/2019  [name] 1936/06/03 [id number]   Patient was readmitted to hospital 3/2-3/9, discharged to SNF for rehab.  Will close case at this time.  Valente David, [place], MSN [place] [phone]

## 2019-10-11 NOTE — Telephone Encounter (Signed)
Spoke w/pt niece pt is currently in a nursing home she will call the office to update [place] on her discharge date

## 2019-10-11 NOTE — Telephone Encounter (Signed)
Very little. However spoke w/niece she is currently in a nursing home she said she will update [place] on her discharge date

## 2019-10-11 NOTE — Progress Notes (Deleted)
Cardiology Office Note   Date:  10/11/2019   ID:  [name], DOB Jan 31, 1936, MRN [id number]  PCP:  [name], PA-C  Cardiologist:  Dr. Johnsie Cancel, MD   No chief complaint on file.     History of Present Illness: [name] is a 84 y.o. female who presents for follow-up of abnormal echocardiogram, seen for Dr. Johnsie Cancel.   [name] has a complicated recent history including hypertension, hyperlipidemia, diastolic heart failure, rheumatoid arthritis on chronic immunosuppression, GERD, and recent CVA of the left posterior frontal lobe on 2/18.   She was admitted from 08/27/19-09/03/19 with syncope, felt to be related to a vasovagal episode with dehydration.  Of note, She has had 6 admissions and 5 ER visits in the last 6 months. She had an echocardiogram on 2/5 with normal EF and asymmetric basal septal hypertrophy - ? HOCM vs. Amyloidosis.  She was not followed by cardiology service.  She was then readmitted once again 09/16/2019 for altered mental status felt likely due to CVA.  Head CT was stable however neurology was consulted and recommended obtaining MRI. This was performed and demonstrated a 1 to 2 cm infarct of the left posterior frontal brain with no mass-effect or hemorrhage evident.  Repeat echocardiogram demonstrated normal LVEF with hyperdynamic myocardium with no wall motion abnormalities. There was severe asymmetric LVH of basal-septal segment consistent with HCOM. Right ventricle is normal with normal RV systolic pressure. No intracardiac thrombus or intra-atrial shunt observed. Case discussed with Dr. Domenic Polite who recommended follow-up with cardiology in the outpatient setting for further investigation of HCOM vs amyloidosis.  Appears cardiac MRI has been ordered however not yet obtained.  She was then seen in the ED on 09/28/2019 for altered mental status once again.  She had recently been seen by her PCP who diagnosed her with UTI and felt that her symptoms  were secondary to UTI.  Initial CT was negative for acute bleed.  Repeat MRI was recommended which was negative for acute intracranial abnormality with expected evolution of known small subacute infarct within the posterior left frontal lobe. She was treated with IV ciprofloxacin and was ultimately discharged on 10/05/2019 to a skilled nursing facility.     On ASA, Plavix for CVA  She was previously seen in cardiology outpatient consultation 08/24/2018 regarding hypertension and CHF referred by her PCP.  Per chart review, she had chronic atypical chest pain with dyspnea on exertion with a history of tobacco use and COPD.  An echocardiogram was performed 01/09/2017 which showed an EF of 65 to 70% with G1 DD and no valvular disease.  No medication changes were made however a repeat echocardiogram was ordered and performed which again was with G1 DD, similar to prior echocardiogram.  Abnormal echocardiogram with evidence of HOCM: -Patient was last seen by cardiology service at which time an echocardiogram was performed 09/07/2018 with normal EF and no valvular disease. -She has multiple recent hospitalizations and ED visits complicated by recent CVA and altered mental status.  Repeat echocardiogram 09/03/2019 with EF at 65 to 70% with hyperdynamic function and severely increased LVH with asymmetric basal and septal hypertrophy measuring 18 mm and basal septum with no resting LVOT obstruction.  Recommendations were to follow-up with cardiac MRI to evaluate for hypertrophic cardiomyopathy versus amyloidosis if clinically indicated. -Follow-up echocardiogram 15 days later on 08/2018 with similar findings -Given significant change in LV function from 08/2018 to 08/2019, will proceed with cardiac MRI for further investigation -In the meantime we  will continue beta-blocker therapy and limit diuresis. -Needs to increase oral intake to prevent hypotension Metoprolol? Dihydropyridine therapy verapamil versus diltiazem.   Avoid volume depletion.  Want to avoid hypotension, lightheadedness and syncope do not use vasodilators including amlodipine, nitroglycerin, ACEI or ARB's.  No diuretics.  Can use with caution in patients without LVOT obstruction.  Recent CVA -Patient suffered a stroke, left posterior frontal lobe on 09/16/2019 -Follow-up with neurology as an outpatient  -Continue Plavix and aspirin   Acute metabolic encephalopathy -possibly secondary to UTI vs failure to thrive and poor oral intake  -unknown baseline- have tried to reach out husband  -MRI brain: No evidence of acute intercranial normality. Expected evolution of known small subacute infarct within the posterior left frontal lobe. -upon review of chart, patient has had confusion since previous admission -Appears to be calm this morning and AAOx2  Acute Enterobacter aerogenes UTI -Urine culture from 10/17/2019 showed Enterobacter aerogenes -Urine culture from 09/22/2019 showed Klebsiella originates, resistance to cephalosporins -PCP had tried to start patient on ciprofloxacin however patient's family was unable to obtain medication -Currently on IV ciprofloxacin -Urine culture shows multidrug resistant organism, however sensitive to ciprofloxacin -Patient with suprapubic pain, obtained x-ray which showed no acute abdominal abnormality. Possible pelvic floor laxity. -Patient continues to complain of pelvic/groin pain -Obtain CT abdomen pelvis which showed normal appearance of bilateral kidneys andrenalcollecting system. No urinary obstruction or pyelonephritis.  Essential hypertension and tachycardia -BP stable  -Continue metoprolol  Acute mild hyponatremia -sodium 130 -repeat in one week  Hypokalemia -resolved with replacement  Hypomagnesemia -resolved with replacement -repeat BMP in one week  Failure to thrive/underweight -BMI of 17.37 -At home, patient reported not to be eating or drinking much -Suspect symptoms to  be secondary to UTI -Continue supplements -Continue on IVF due to poor oral intake -Have added low-dose Remeron  Normocytic anemia -hemoglobin stable  Diabetes mellitus, type II -hemoglobin A1c 6.2 on 09/17/2019 -Not on any diabetic medications at home, likely diet controlled  Hyperlipidemia -Recent lipid panel from 09/17/2019 showed total cholesterol 106, HDL 34, LDL 34, triglycerides 190 -Continue statin  Elevated AST -Acute on chronic -AST elevated to 42 (was higher in Jan 2021)  Deconditioning -PT recommended home health -Recommended home health, if patient's family unable to provide 24/7 assist may need SNF. 3 and 1 bedside commode.  Anxiety -Started patient on low-dose Ativan which seems to have helped  Past Medical History:  Diagnosis Date  . Allergy   . Anemia    "when I was young"  . Anginal pain ([place])   . Anxiety   . Arthritis    "all over my body"   . Chest pain 04/07/2014  . Chronic diastolic heart failure, NYHA class 1 ([place]) 01/22/2015  . Chronic lower back pain   . Chronic steroid use 12/11/2012  . COPD (chronic obstructive pulmonary disease) ([place]) 01/09/2017  . 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 Elevated lactic acid level 01/22/2015  . Essential hypertension 06/15/2018  . Frequent UTI    "recently" (06/14/2015)  . Gastric mass   . GERD (gastroesophageal reflux disease)   . Headache(784.0)    "weekly; wake up w/them " (110/19/2018)  . Heart murmur   . High cholesterol   . History of blood transfusion ~ 1954   "related to my periods"  . Migraine    "once/month maybe" (05/16/2017)  . Multiple falls 06/14/2015  . Noncompliance 12/11/2012  . OSA (obstructive sleep apnea)    "I think  I'm using a BiPAP" (06/14/2015)  . Sepsis ([place]) 01/09/2017  . Syncope 08/27/2019  . Syncope and collapse 12/11/2012  . Tachycardia with heart rate 100-120 beats per minute 04/08/2014  . Type II diabetes mellitus ([place])   .  Uncontrolled type 2 diabetes mellitus with complication [facility])     Past Surgical History:  Procedure Laterality Date  . APPENDECTOMY  1970   Archie Endo 12/12/2010  . BACK SURGERY    . BIOPSY  05/25/2019   Procedure: BIOPSY;  Surgeon: Rush Landmark Telford Nab., MD;  Location: [place];  Service: Gastroenterology;;  . [place]   Archie Endo 12/12/2010  . ESOPHAGOGASTRODUODENOSCOPY N/A 05/25/2019   Procedure: ESOPHAGOGASTRODUODENOSCOPY (EGD);  Surgeon: Irving Copas., MD;  Location: [place];  Service: Gastroenterology;  Laterality: N/A;  . EUS N/A 10/16/2017   Procedure: UPPER ENDOSCOPIC ULTRASOUND (EUS) RADIAL;  Surgeon: Milus Banister, MD;  Location: [facility];  Service: Endoscopy;  Laterality: N/A;  . FRACTURE SURGERY    . LAPAROSCOPIC CHOLECYSTECTOMY  1990's  . LUMBAR MICRODISCECTOMY Left 09/2006   L5-S1/notes 12/12/2010  . MULTIPLE EXTRACTIONS WITH ALVEOLOPLASTY Bilateral 06/13/2017   Procedure: MULTIPLE EXTRACTION WITH ALVEOLOPLASTY;  Surgeon: Diona Browner, DDS;  Location: [facility];  Service: Oral Surgery;  Laterality: Bilateral;  . MULTIPLE TOOTH EXTRACTIONS    . ORIF SHOULDER FRACTURE Left ~ 2009   "put cement down in it"   . TONSILLECTOMY  1950's?  . TOTAL ABDOMINAL HYSTERECTOMY  1979   Archie Endo 12/12/2010     Current Outpatient Medications  Medication Sig Dispense Refill  . albuterol (ACCUNEB) 0.63 MG/3ML nebulizer solution Take 1 ampule by nebulization every 6 (six) hours as needed for wheezing.     Marland Kitchen aspirin EC 81 MG tablet Take 1 tablet (81 mg total) by mouth every Monday, Wednesday, and Friday.    Marland Kitchen atorvastatin (LIPITOR) 20 MG tablet Take 1 tablet (20 mg total) by mouth daily. Take 1 Tablet by mouth daily. (Patient taking differently: Take 20 mg by mouth daily. ) 30 tablet 0  . clopidogrel (PLAVIX) 75 MG tablet Take 1 tablet (75 mg total) by mouth daily. 30 tablet 0  . dicyclomine (BENTYL) 10 MG capsule Take 1 capsule (10 mg total) by mouth  every 8 (eight) hours as needed for spasms. 60 capsule 3  . docusate sodium (COLACE) 100 MG capsule Take 1 capsule (100 mg total) by mouth 2 (two) times daily. 10 capsule 0  . feeding supplement, ENSURE ENLIVE, (ENSURE ENLIVE) LIQD Take 237 mLs by mouth 3 (three) times daily between meals. 123XX123 mL 12  . folic acid (FOLVITE) 1 MG tablet Take 1 tablet (1 mg total) by mouth daily. 100 tablet 3  . glucose blood (ONETOUCH VERIO) test strip Use to check blood sugar 3 to 4 times a day before meals 100 each 12  . hydrOXYzine (ATARAX/VISTARIL) 10 MG tablet 1 q 8h prn itching (Patient taking differently: Take 10 mg by mouth every 8 (eight) hours as needed for itching. ) 30 tablet 0  . ipratropium-albuterol (DUONEB) 0.5-2.5 (3) MG/3ML SOLN Take 3 mLs by nebulization every 6 (six) hours as needed. 360 mL 0  . LORazepam (ATIVAN) 0.5 MG tablet Take 1 tablet (0.5 mg total) by mouth every 6 (six) hours as needed for anxiety. 20 tablet 0  . metoCLOPramide (REGLAN) 5 MG tablet Take 1 tablet (5 mg total) by mouth 3 (three) times daily before meals. 90 tablet 3  . metoprolol tartrate (LOPRESSOR) 50 MG tablet Take 1 tablet (  50 mg total) by mouth 2 (two) times daily.    . mirtazapine (REMERON SOL-TAB) 15 MG disintegrating tablet Take 0.5 tablets (7.5 mg total) by mouth at bedtime.    . mometasone-formoterol (DULERA) 200-5 MCG/ACT AERO Inhale 2 puffs into the lungs 2 (two) times daily.    . Multiple Vitamin (MULTIVITAMIN WITH MINERALS) TABS tablet Take 1 tablet by mouth daily. 30 tablet 0  . ondansetron (ZOFRAN ODT) 4 MG disintegrating tablet Take 1 tablet (4 mg total) by mouth every 8 (eight) hours as needed for up to 30 doses for nausea or vomiting. 30 tablet 0  . simethicone (GAS-X EXTRA STRENGTH) 125 MG chewable tablet Chew 125 mg by mouth every 6 (six) hours as needed for flatulence.    . tamsulosin (FLOMAX) 0.4 MG CAPS capsule Take 1 capsule (0.4 mg total) by mouth daily.    . vitamin B-12 1000 MCG tablet Take 1  tablet (1,000 mcg total) by mouth daily. 30 tablet 0   No current facility-administered medications for this visit.    Allergies:   Lactose intolerance (gi)    Social History:  The patient  reports that she has quit smoking. Her smoking use included cigarettes. She has never used smokeless tobacco. She reports previous alcohol use. She reports that she does not use drugs.   Family History:  The patient's ***family history includes Cancer - Other in her sister; Diabetes Mellitus II in her sister; Heart attack in her father; Hypertension in her mother and sister; Migraines in her niece.    ROS:  Please see the history of present illness.   Otherwise, review of systems are positive for {NONE DEFAULTED:18576::"none"}.   All other systems are reviewed and negative.    PHYSICAL EXAM: VS:  There were no vitals taken for this visit. , BMI There is no height or weight on file to calculate BMI. GEN: Well nourished, well developed, in no acute distress HEENT: normal Neck: no JVD, carotid bruits, or masses Cardiac: ***RRR; no murmurs, rubs, or gallops,no edema  Respiratory:  clear to auscultation bilaterally, normal work of breathing GI: soft, nontender, nondistended, + BS MS: no deformity or atrophy Skin: warm and dry, no rash Neuro:  Strength and sensation are intact Psych: euthymic mood, full affect   EKG:  EKG {ACTION; IS/IS [id number] ordered today. The ekg ordered today demonstrates ***   Recent Labs: 09/06/2019: B Natriuretic Peptide 45.5 09/18/2019: TSH 1.829 10/01/2019: ALT 22 10/02/2019: Hemoglobin 10.7; Platelets 231 10/05/2019: BUN <5; Creatinine, Ser 0.83; Magnesium 1.7; Potassium 3.5; Sodium 130    Lipid Panel    Component Value Date/Time   CHOL 106 09/17/2019 0445   CHOL 368 (H) 08/13/2018 1606   TRIG 190 (H) 09/17/2019 0445   HDL 34 (L) 09/17/2019 0445   HDL 70 08/13/2018 1606   CHOLHDL 3.1 09/17/2019 0445   VLDL 38 09/17/2019 0445   LDLCALC 34 09/17/2019 0445    LDLCALC 243 (H) 08/13/2018 1606      Wt Readings from Last 3 Encounters:  09/28/19 114 lb 6.7 oz (51.9 kg)  09/22/19 104 lb 6.4 oz (47.4 kg)  09/17/19 106 lb 0.7 oz (48.1 kg)      Other studies Reviewed: Additional studies/ records that were reviewed today include: ***. Review of the above records demonstrates: ***  Echocardiogram 09/03/2019:   1. Left ventricular ejection fraction, by visual estimation, is 65 to  70%. The left ventricle has hyperdynamic function. There is severely  increased left ventricular hypertrophy.  2. Asymmetric  basal septal hypertrophy measuring 71mm in basal septum  (74mm in posterior wall). No resting LVOT obstruction. Consider cardiac  MRI to evaluate for hypertrophic cardiomyopathy versus amyloidosis if  clinically indicated  3. Left ventricular diastolic parameters are indeterminate.  4. Global right ventricle has normal systolic function.The right  ventricular size is normal.  5. Small pericardial effusion.  6. Presence of pericardial fat pad.  7. The mitral valve is normal in structure. Trivial mitral valve  regurgitation.  8. The tricuspid valve is normal in structure. Tricuspid valve  regurgitation is not demonstrated.  9. The pulmonic valve was not well visualized. Pulmonic valve  regurgitation is not visualized.  10. The aortic valve was not well visualized. Aortic valve regurgitation  is not visualized. No evidence of aortic valve stenosis.  11. Left atrial size was normal.  12. Right atrial size was normal.  13. The inferior vena cava is normal in size with <50% respiratory  variability, suggesting right atrial pressure of 8 mmHg.  14. TR signal is inadequate for assessing pulmonary artery systolic  pressure.   Echocardiogram 09/18/2019:    1. Left ventricular ejection fraction, by estimation, is >75%. Left  ventricular ejection fraction by PLAX is 86 %. The left ventricle has  hyperdynamic function. The left ventricle  has no regional wall motion  abnormalities. There is severe asymmetric  left ventricular hypertrophy of the basal-septal segment.  2. Right ventricular systolic function is normal. The right ventricular  size is normal. There is normal pulmonary artery systolic pressure.  3. The mitral valve is normal in structure and function. No evidence of  mitral valve regurgitation. No evidence of mitral stenosis.  4. The aortic valve is tricuspid. Aortic valve regurgitation is not  visualized. No aortic stenosis is present.  5. The inferior vena cava is normal in size with greater than 50%  respiratory variability, suggesting right atrial pressure of 3 mmHg.   ASSESSMENT AND PLAN:  1.  ***   Current medicines are reviewed at length with the patient today.  The patient {ACTIONS; HAS/DOES NOT HAVE:19233} concerns regarding medicines.  The following changes have been made:  {PLAN; NO CHANGE:13088:s}  Labs/ tests ordered today include: *** No orders of the defined types were placed in this encounter.    Disposition:   FU with *** in {gen number [id number] {Days to years:10300}  Signed, Kathyrn Drown, NP  10/11/2019 9:05 AM    [facility] [address], [place], [place]  [address] Phone: [phone]; Fax: [phone]

## 2019-10-12 NOTE — Telephone Encounter (Signed)
Great. Thank you.

## 2019-10-13 ENCOUNTER — Ambulatory Visit: Payer: Medicare Other | Admitting: Cardiology

## 2019-10-15 ENCOUNTER — Emergency Department (HOSPITAL_COMMUNITY)
Admission: EM | Admit: 2019-10-15 | Discharge: 2019-10-15 | Disposition: A | Discharge status: Home / Self Care | Payer: Medicare Other | Attending: Emergency Medicine | Admitting: Emergency Medicine

## 2019-10-15 ENCOUNTER — Emergency Department (HOSPITAL_COMMUNITY): Payer: Medicare Other

## 2019-10-15 ENCOUNTER — Encounter (HOSPITAL_COMMUNITY): Payer: Self-pay | Admitting: Emergency Medicine

## 2019-10-15 DIAGNOSIS — Y939 Activity, unspecified: Secondary | ICD-10-CM | POA: Diagnosis not present

## 2019-10-15 DIAGNOSIS — Y92122 Bedroom in nursing home as the place of occurrence of the external cause: Secondary | ICD-10-CM | POA: Diagnosis not present

## 2019-10-15 DIAGNOSIS — Z7982 Long term (current) use of aspirin: Secondary | ICD-10-CM | POA: Insufficient documentation

## 2019-10-15 DIAGNOSIS — W19XXXA Unspecified fall, initial encounter: Secondary | ICD-10-CM

## 2019-10-15 DIAGNOSIS — J449 Chronic obstructive pulmonary disease, unspecified: Secondary | ICD-10-CM | POA: Insufficient documentation

## 2019-10-15 DIAGNOSIS — Y999 Unspecified external cause status: Secondary | ICD-10-CM | POA: Insufficient documentation

## 2019-10-15 DIAGNOSIS — E119 Type 2 diabetes mellitus without complications: Secondary | ICD-10-CM | POA: Diagnosis not present

## 2019-10-15 DIAGNOSIS — I5032 Chronic diastolic (congestive) heart failure: Secondary | ICD-10-CM | POA: Insufficient documentation

## 2019-10-15 DIAGNOSIS — S80811A Abrasion, right lower leg, initial encounter: Secondary | ICD-10-CM | POA: Diagnosis not present

## 2019-10-15 DIAGNOSIS — M25511 Pain in right shoulder: Secondary | ICD-10-CM | POA: Insufficient documentation

## 2019-10-15 DIAGNOSIS — Z7902 Long term (current) use of antithrombotics/antiplatelets: Secondary | ICD-10-CM | POA: Insufficient documentation

## 2019-10-15 DIAGNOSIS — F0390 Unspecified dementia without behavioral disturbance: Secondary | ICD-10-CM | POA: Diagnosis not present

## 2019-10-15 DIAGNOSIS — W06XXXA Fall from bed, initial encounter: Secondary | ICD-10-CM | POA: Insufficient documentation

## 2019-10-15 DIAGNOSIS — Z66 Do not resuscitate: Secondary | ICD-10-CM | POA: Insufficient documentation

## 2019-10-15 DIAGNOSIS — Z87891 Personal history of nicotine dependence: Secondary | ICD-10-CM | POA: Insufficient documentation

## 2019-10-15 DIAGNOSIS — I110 Hypertensive heart disease with heart failure: Secondary | ICD-10-CM | POA: Insufficient documentation

## 2019-10-15 DIAGNOSIS — M25551 Pain in right hip: Secondary | ICD-10-CM | POA: Insufficient documentation

## 2019-10-15 DIAGNOSIS — Z79899 Other long term (current) drug therapy: Secondary | ICD-10-CM | POA: Diagnosis not present

## 2019-10-15 DIAGNOSIS — S8991XA Unspecified injury of right lower leg, initial encounter: Secondary | ICD-10-CM | POA: Diagnosis present

## 2019-10-15 NOTE — ED Triage Notes (Signed)
Pt arrives from nursing facility [facility] after being found lying on a rubber mat beside of her bed presumably she rolled off her bed, unwitnessed fall. Pt is alert and oriented to person but confused of time and place. Pt does remember she fell off bed and could not get up. Per ems this is pts baseline. Pt arrives in c collar.

## 2019-10-15 NOTE — ED Notes (Signed)
Called [facility] for pt transport

## 2019-10-15 NOTE — Discharge Instructions (Signed)
As discussed, your evaluation today has been largely reassuring.  But, it is important that you monitor your condition carefully, and do not hesitate to return to the ED if you develop new, or concerning changes in your condition. ? ?Otherwise, please follow-up with your physician for appropriate ongoing care. ? ?

## 2019-10-15 NOTE — ED Provider Notes (Signed)
[facility] EMERGENCY DEPARTMENT Provider Note   CSN: [id number] Arrival date & time: 10/15/19  [id number]     History Chief Complaint  Patient presents with  . Fall    [name] is a 84 y.o. female.  HPI    Patient presents from nursing facility after a fall.  Circumstances not entirely clear, but it seems as though the patient fell from her bed, approximately 1 feet onto the ground.  Patient is multiple medical issues including dementia, is reportedly alert, oriented x1 at baseline, and this has been the case, per EMS report in transport. Level 5 caveat secondary to dementia. The patient herself is somewhat interactive, speaks clearly, follows some commands, but is oriented mostly to self.  She describes pain in her shoulder with range of motion exercises, but without prompting does not complain of pain anywhere. EMS reports that the patient was in her usual state of health when she was found on the ground next to her bed.  Unclear loss of consciousness.  Reported the patient had several areas of abrasion, no active bleeding, lacerations.  No hemodynamic instability in route. Past Medical History:  Diagnosis Date  . Allergy   . Anemia    "when I was young"  . Anginal pain ([place])   . Anxiety   . Arthritis    "all over my body"   . Chest pain 04/07/2014  . Chronic diastolic heart failure, NYHA class 1 ([place]) 01/22/2015  . Chronic lower back pain   . Chronic steroid use 12/11/2012  . COPD (chronic obstructive pulmonary disease) ([place]) 01/09/2017  . 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 Elevated lactic acid level 01/22/2015  . Essential hypertension 06/15/2018  . Frequent UTI    "recently" (06/14/2015)  . Gastric mass   . GERD (gastroesophageal reflux disease)   . Headache(784.0)    "weekly; wake up w/them " (110/19/2018)  . Heart murmur   . High cholesterol   . History of blood transfusion ~ 1954   "related to my periods"  .  Migraine    "once/month maybe" (05/16/2017)  . Multiple falls 06/14/2015  . Noncompliance 12/11/2012  . OSA (obstructive sleep apnea)    "I think I'm using a BiPAP" (06/14/2015)  . Sepsis ([place]) 01/09/2017  . Syncope 08/27/2019  . Syncope and collapse 12/11/2012  . Tachycardia with heart rate 100-120 beats per minute 04/08/2014  . Type II diabetes mellitus ([place])   . Uncontrolled type 2 diabetes mellitus with complication [facility])     Patient Active Problem List   Diagnosis Date Noted  . Acute encephalopathy 09/28/2019  . Hyponatremia 09/28/2019  . Normocytic anemia 09/28/2019  . Elevated AST (SGOT) 09/28/2019  . Protein-calorie malnutrition, severe 09/17/2019  . Acute ischemic stroke ([place]) 09/16/2019  . DNR (do not resuscitate) 08/02/2019  . Dehydration 08/01/2019  . Urinary tract infection symptoms 07/31/2019  . Diabetic gastroparesis ([place]) 07/31/2019  . Unexplained weight loss 07/12/2019  . Non-intractable vomiting   . Nausea, vomiting, and diarrhea 05/30/2019  . Hypokalemia 05/30/2019  . Hypomagnesemia 05/30/2019  . Gastritis 05/30/2019  . Elevated lipase 05/30/2019  . SIRS (systemic inflammatory response syndrome) ([place]) 05/30/2019  . History of COPD   . Other chronic pain 04/22/2019  . Dyspnea 08/10/2018  . Acute diastolic heart failure ([place]) 06/15/2018  . Essential hypertension 06/15/2018  . 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 Gastric mass   . Sepsis secondary to UTI ([place])   . Diabetes mellitus type 2, controlled (  [place])   . AKI (acute kidney injury) ([place])   . Chronic diastolic CHF (congestive heart failure) ([place])   . Urinary tract infection due to Klebsiella species 01/09/2017  . Sepsis ([place]) 01/09/2017  . Constipation 01/09/2017  . COPD (chronic obstructive pulmonary disease) ([place]) 01/09/2017  . Multiple falls 06/14/2015  . Weight loss 06/14/2015  . Acute respiratory failure with hypoxia ([place]) 06/14/2015  . Orthostasis 06/14/2015  . Acute respiratory failure ([place])  06/14/2015  . Elevated lactic acid level 01/22/2015  . 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 Chronic diastolic heart failure, NYHA class 1 ([place]) 01/22/2015  . Tachycardia with heart rate 100-120 beats per minute 04/08/2014  . DM (diabetes mellitus) ([place]) 04/08/2014  . Chest pain 04/07/2014  . Syncope 04/07/2014  . Hyperglycemia 12/11/2012  . Syncope and collapse 12/11/2012  . Dehydration, mild 12/11/2012  . Noncompliance 12/11/2012  . 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 Rheumatoid arthritis ([place]) 12/11/2012    Past Surgical History:  Procedure Laterality Date  . APPENDECTOMY  1970   Archie Endo 12/12/2010  . BACK SURGERY    . BIOPSY  05/25/2019   Procedure: BIOPSY;  Surgeon: Rush Landmark Telford Nab., MD;  Location: [place];  Service: Gastroenterology;;  . [place]   Archie Endo 12/12/2010  . ESOPHAGOGASTRODUODENOSCOPY N/A 05/25/2019   Procedure: ESOPHAGOGASTRODUODENOSCOPY (EGD);  Surgeon: Irving Copas., MD;  Location: [place];  Service: Gastroenterology;  Laterality: N/A;  . EUS N/A 10/16/2017   Procedure: UPPER ENDOSCOPIC ULTRASOUND (EUS) RADIAL;  Surgeon: Milus Banister, MD;  Location: [facility];  Service: Endoscopy;  Laterality: N/A;  . FRACTURE SURGERY    . LAPAROSCOPIC CHOLECYSTECTOMY  1990's  . LUMBAR MICRODISCECTOMY Left 09/2006   L5-S1/notes 12/12/2010  . MULTIPLE EXTRACTIONS WITH ALVEOLOPLASTY Bilateral 06/13/2017   Procedure: MULTIPLE EXTRACTION WITH ALVEOLOPLASTY;  Surgeon: Diona Browner, DDS;  Location: [place];  Service: Oral Surgery;  Laterality: Bilateral;  . MULTIPLE TOOTH EXTRACTIONS    . ORIF SHOULDER FRACTURE Left ~ 2009   "put cement down in it"   . TONSILLECTOMY  1950's?  . TOTAL ABDOMINAL HYSTERECTOMY  1979   Archie Endo 12/12/2010     OB History   No obstetric history on file.     Family History  Problem Relation Age of Onset  . Hypertension Mother   . Heart attack Father   . Diabetes Mellitus  II Sister   . Hypertension Sister   . Cancer - Other Sister   . Migraines Niece   . Colon cancer Neg Hx     Social History   Tobacco Use  . Smoking status: Former Smoker    Types: Cigarettes  . Smokeless tobacco: Never Used  . Tobacco comment: 05/16/2017"1 pack of cigarettes would last 3-4 months; haven't had any cigarettes for 40 years"  Substance Use Topics  . Alcohol use: Not Currently    Comment: 05/16/2017 "used to drink beer in the 1990's; don't drink at all now"  . Drug use: No    Home Medications Prior to Admission medications   Medication Sig Start Date End Date Taking? Authorizing Provider  albuterol (ACCUNEB) 0.63 MG/3ML nebulizer solution Take 1 ampule by nebulization every 6 (six) hours as needed for wheezing.    Yes [provider]  aspirin EC 81 MG tablet Take 1 tablet (81 mg total) by mouth every Monday, Wednesday, and Friday. 08/04/19  Yes Vann, Jessica U, DO  atorvastatin (LIPITOR) 20 MG tablet Take 1 tablet (20 mg total) by mouth  daily. Take 1 Tablet by mouth daily. Patient taking differently: Take 20 mg by mouth daily.  09/19/19  Yes Swayze, Ava, DO  clopidogrel (PLAVIX) 75 MG tablet Take 1 tablet (75 mg total) by mouth daily. 09/20/19  Yes Swayze, Ava, DO  dicyclomine (BENTYL) 10 MG capsule Take 1 capsule (10 mg total) by mouth every 8 (eight) hours as needed for spasms. 08/23/19  Yes Armbruster, Carlota Raspberry, MD  docusate sodium (COLACE) 100 MG capsule Take 1 capsule (100 mg total) by mouth 2 (two) times daily. 08/03/19  Yes Vann, Jessica U, DO  feeding supplement, ENSURE ENLIVE, (ENSURE ENLIVE) LIQD Take 237 mLs by mouth 3 (three) times daily between meals. 09/19/19  Yes Swayze, Ava, DO  fluticasone (VERAMYST) 27.5 MCG/SPRAY nasal spray Place 2 sprays into the nose daily.   Yes [provider]  Fluticasone-Salmeterol (ADVAIR) 500-50 MCG/DOSE AEPB Inhale 1 puff into the lungs 2 (two) times daily.   Yes [provider]  folic acid (FOLVITE) 1 MG  tablet Take 1 tablet (1 mg total) by mouth daily. 09/22/19 09/21/20 Yes Rodriguez-Southworth, Sunday Spillers, PA-C  hydrOXYzine (ATARAX/VISTARIL) 10 MG tablet 1 q 8h prn itching Patient taking differently: Take 10 mg by mouth every 8 (eight) hours as needed for itching.  04/22/19  Yes Rodriguez-Southworth, Sunday Spillers, PA-C  ibuprofen (ADVIL) 400 MG tablet Take 400 mg by mouth every 8 (eight) hours as needed for mild pain.   Yes [provider]  ipratropium-albuterol (DUONEB) 0.5-2.5 (3) MG/3ML SOLN Take 3 mLs by nebulization every 6 (six) hours as needed. 09/30/19  Yes Rodriguez-Southworth, Sunday Spillers, PA-C  LORazepam (ATIVAN) 0.5 MG tablet Take 1 tablet (0.5 mg total) by mouth every 6 (six) hours as needed for anxiety. 10/05/19  Yes Mikhail, Velta Addison, DO  metoCLOPramide (REGLAN) 5 MG tablet Take 1 tablet (5 mg total) by mouth 3 (three) times daily before meals. 08/23/19  Yes Armbruster, Carlota Raspberry, MD  metoprolol tartrate (LOPRESSOR) 50 MG tablet Take 1 tablet (50 mg total) by mouth 2 (two) times daily. 10/05/19  Yes Mikhail, Velta Addison, DO  mirtazapine (REMERON SOL-TAB) 15 MG disintegrating tablet Take 0.5 tablets (7.5 mg total) by mouth at bedtime. 10/05/19  Yes Mikhail, Maryann, DO  mometasone-formoterol (DULERA) 100-5 MCG/ACT AERO Inhale 2 puffs into the lungs 2 (two) times daily.   Yes [provider]  Multiple Vitamin (MULTIVITAMIN WITH MINERALS) TABS tablet Take 1 tablet by mouth daily. 09/20/19  Yes Swayze, Ava, DO  ondansetron (ZOFRAN ODT) 4 MG disintegrating tablet Take 1 tablet (4 mg total) by mouth every 8 (eight) hours as needed for up to 30 doses for nausea or vomiting. 09/02/19  Yes Antonieta Pert, MD  simethicone (GAS-X EXTRA STRENGTH) 125 MG chewable tablet Chew 125 mg by mouth every 6 (six) hours as needed for flatulence.   Yes [provider]  tamsulosin (FLOMAX) 0.4 MG CAPS capsule Take 1 capsule (0.4 mg total) by mouth daily. 10/06/19  Yes Mikhail, Velta Addison, DO  traMADol (ULTRAM) 50 MG tablet  Take 50 mg by mouth every 6 (six) hours as needed for moderate pain.   Yes [provider]  vitamin B-12 1000 MCG tablet Take 1 tablet (1,000 mcg total) by mouth daily. 09/20/19  Yes Swayze, Ava, DO  glucose blood (ONETOUCH VERIO) test strip Use to check blood sugar 3 to 4 times a day before meals 03/25/19   Rodriguez-Southworth, Sunday Spillers, PA-C    Allergies    Lactose intolerance (gi)  Review of Systems   Review of Systems  Unable to perform ROS: Dementia    Physical Exam Updated Vital Signs BP 109/78   Pulse 88   Temp (!) 97.5 F (36.4 C) (Oral)   SpO2 100%   Physical Exam Vitals and nursing note reviewed.  Constitutional:      Appearance: She is well-developed. She is ill-appearing.     Comments: Deconditioned, ill-appearing elderly female in no distress  HENT:     Head: Normocephalic and atraumatic.  Eyes:     Conjunctiva/sclera: Conjunctivae normal.  Neck:     Comments: No gross deformities, patient in cervical neck collar Cardiovascular:     Rate and Rhythm: Normal rate and regular rhythm.  Pulmonary:     Effort: Pulmonary effort is normal. No respiratory distress.     Breath sounds: Normal breath sounds. No stridor.  Abdominal:     General: There is no distension.     Tenderness: There is no abdominal tenderness. There is no guarding.  Musculoskeletal:       Arms:  Skin:    General: Skin is warm and dry.       Neurological:     Mental Status: She is alert.     Cranial Nerves: No cranial nerve deficit or dysarthria.     Motor: Weakness and atrophy present. No tremor.     Comments: Patient follows commands generally appropriately, has a very brief, but clear speech without facial asymmetry.  Psychiatric:        Cognition and Memory: Cognition is impaired.     ED Results / Procedures / Treatments   Labs (all labs ordered are listed, but only abnormal results are displayed) Labs Reviewed - No data to display  EKG None  Radiology DG Shoulder  Right  Result Date: 10/15/2019 CLINICAL DATA:  Fall, pain EXAM: RIGHT SHOULDER - 2+ VIEW COMPARISON:  None. FINDINGS: There is no evidence of fracture or dislocation. Moderate acromioclavicular and glenohumeral arthrosis. Soft tissues are unremarkable. IMPRESSION: No fracture or dislocation of the right shoulder. Moderate acromioclavicular and glenohumeral arthrosis. Electronically Signed   By: Eddie Candle M.D.   On: 10/15/2019 10:16   CT Head Wo Contrast  Result Date: 10/15/2019 CLINICAL DATA:  Head trauma, minor. Polytrauma, critical, head/cervical spine injury suspected. EXAM: CT HEAD WITHOUT CONTRAST CT CERVICAL SPINE WITHOUT CONTRAST TECHNIQUE: Multidetector CT imaging of the head and cervical spine was performed following the standard protocol without intravenous contrast. Multiplanar CT image reconstructions of the cervical spine were also generated. COMPARISON:  Brain MRI 09/28/2019, noncontrast head CT 09/28/2019, CT cervical spine 08/27/2019 FINDINGS: CT HEAD FINDINGS Brain: There is no evidence of acute intracranial hemorrhage, intracranial mass, midline shift or extra-axial fluid collection.Known small late subacute infarct within the posterior left frontal lobe. Ill-defined hypoattenuation within the cerebral white matter is nonspecific, but consistent with chronic small vessel ischemic disease. Stable, mild generalized parenchymal atrophy Vascular: No hyperdense vessel.  Atherosclerotic calcifications. Skull: Normal. Negative for fracture or focal lesion. Sinuses/Orbits: Visualized orbits demonstrate no acute abnormality. No significant paranasal sinus disease or mastoid effusion at the imaged levels. CT CERVICAL SPINE FINDINGS Alignment: Straightening of the expected cervical lordosis. Trace C4-C5 anterolisthesis. Skull base and vertebrae: The basion-dental and atlanto-dental intervals are maintained.No evidence of acute fracture to the cervical spine. Soft tissues and spinal canal: No  prevertebral fluid or swelling. No visible canal hematoma. Disc levels: Cervical spondylosis with multilevel disc height loss, posterior disc osteophytes as well as uncovertebral and facet hypertrophy. No high-grade bony spinal canal stenosis. Multilevel degenerative fusion.  Upper chest: No consolidation within the imaged lung apices. No visible pneumothorax. IMPRESSION: CT head: 1. No evidence of acute intracranial abnormality. 2. Known small late subacute infarct within the posterior left frontal lobe. 3. Generalized parenchymal atrophy and chronic small vessel ischemic disease. CT cervical spine: 1. No evidence of acute fracture to the cervical spine. 2. Cervical spondylosis. Electronically Signed   By: Kellie Simmering DO   On: 10/15/2019 10:39   CT Cervical Spine Wo Contrast  Result Date: 10/15/2019 CLINICAL DATA:  Head trauma, minor. Polytrauma, critical, head/cervical spine injury suspected. EXAM: CT HEAD WITHOUT CONTRAST CT CERVICAL SPINE WITHOUT CONTRAST TECHNIQUE: Multidetector CT imaging of the head and cervical spine was performed following the standard protocol without intravenous contrast. Multiplanar CT image reconstructions of the cervical spine were also generated. COMPARISON:  Brain MRI 09/28/2019, noncontrast head CT 09/28/2019, CT cervical spine 08/27/2019 FINDINGS: CT HEAD FINDINGS Brain: There is no evidence of acute intracranial hemorrhage, intracranial mass, midline shift or extra-axial fluid collection.Known small late subacute infarct within the posterior left frontal lobe. Ill-defined hypoattenuation within the cerebral white matter is nonspecific, but consistent with chronic small vessel ischemic disease. Stable, mild generalized parenchymal atrophy Vascular: No hyperdense vessel.  Atherosclerotic calcifications. Skull: Normal. Negative for fracture or focal lesion. Sinuses/Orbits: Visualized orbits demonstrate no acute abnormality. No significant paranasal sinus disease or mastoid  effusion at the imaged levels. CT CERVICAL SPINE FINDINGS Alignment: Straightening of the expected cervical lordosis. Trace C4-C5 anterolisthesis. Skull base and vertebrae: The basion-dental and atlanto-dental intervals are maintained.No evidence of acute fracture to the cervical spine. Soft tissues and spinal canal: No prevertebral fluid or swelling. No visible canal hematoma. Disc levels: Cervical spondylosis with multilevel disc height loss, posterior disc osteophytes as well as uncovertebral and facet hypertrophy. No high-grade bony spinal canal stenosis. Multilevel degenerative fusion. Upper chest: No consolidation within the imaged lung apices. No visible pneumothorax. IMPRESSION: CT head: 1. No evidence of acute intracranial abnormality. 2. Known small late subacute infarct within the posterior left frontal lobe. 3. Generalized parenchymal atrophy and chronic small vessel ischemic disease. CT cervical spine: 1. No evidence of acute fracture to the cervical spine. 2. Cervical spondylosis. Electronically Signed   By: Kellie Simmering DO   On: 10/15/2019 10:39   DG Hip Unilat With Pelvis 2-3 Views Right  Result Date: 10/15/2019 CLINICAL DATA:  Fall from bed EXAM: DG HIP (WITH OR WITHOUT PELVIS) 2-3V RIGHT COMPARISON:  None. FINDINGS: There is no evidence of displaced hip fracture or dislocation. Mild right hip joint arthrosis. The pelvis and left femur are unremarkable in single frontal view. Soft tissue calcifications about the right ilium, likely posttraumatic fat necrosis or injection granulomas. IMPRESSION: No displaced fracture or dislocation. Mild right hip joint arthrosis. Electronically Signed   By: Eddie Candle M.D.   On: 10/15/2019 10:18    Procedures Procedures (including critical care time)  Medications Ordered in ED Medications - No data to display  ED Course  I have reviewed the triage vital signs and the nursing notes.  Pertinent labs & imaging results that were available during my  care of the patient were reviewed by me and considered in my medical decision making (see chart for details).  11:36 AM Patient awake, alert, cervical collar removed. This elderly female presents emergency facility after fall.  Patient is on Plavix, and with a history of dementia, CT scan head, neck both indicated.  These are reassuring, as are the patient's x-rays.  Patient has some abrasions, but  no lacerations, no wounds requiring repair.  With generally reassuring findings, patient is appropriate for return to her nursing facility. Final Clinical Impression(s) / ED Diagnoses Final diagnoses:  Fall, initial encounter      Carmin Muskrat, MD 10/15/19 1137

## 2019-10-16 NOTE — Progress Notes (Deleted)
Cardiology Office Note   Date:  10/16/2019   ID:  [name], DOB [birth year], MRN [id number]  PCP:  [name], PA-C  Cardiologist:  Kathyrn Drown, NP   No chief complaint on file.     History of Present Illness: [name] is a 84 y.o. female who presents for follow-up of abnormal echocardiogram, seen for Dr. Johnsie Cancel.   [name] has a complicated recent history including hypertension, hyperlipidemia, diastolic heart failure, rheumatoid arthritis on chronic immunosuppression, GERD, and recent CVA of the left posterior frontal lobe on 2/18.   She was admitted from 08/27/19-09/03/19 with syncope, felt to be related to a vasovagal episode with dehydration. Of note, She has had 6 admissions and 5 ER visits in the last 6 months. She had an echocardiogram on 2/5 with normal EF and asymmetric basal septal hypertrophy - ? HOCM vs. Amyloidosis. She was not followed by cardiology service.   She was then readmitted once again 09/16/2019 for altered mental status felt likely due to CVA. Head CT was stable however neurology was consulted and recommended obtaining MRI. This was performed and demonstrated a 1 to 2 cm infarct of the left posterior frontal brain with no mass-effect or hemorrhage evident. Repeat echocardiogram demonstrated normal LVEF with hyperdynamic myocardium with no wall motion abnormalities. There was severe asymmetric LVH of basal-septal segment consistent with HCOM. Right ventricle is normal with normal RV systolic pressure. No intracardiac thrombus or intra-atrial shunt observed. Case discussed with Dr. Domenic Polite who recommended follow-up with cardiology in the outpatient setting for further investigation of HCOM vs amyloidosis. Appears cardiac MRI has been ordered however not yet obtained.   She was then seen in the ED on 09/28/2019 for altered mental status once again. She had recently been seen by her PCP who diagnosed her with UTI and felt that her  symptoms were secondary to UTI. Initial CT was negative for acute bleed. Repeat MRI was recommended which was negative for acute intracranial abnormality with expected evolution of known small subacute infarct within the posterior left frontal lobe. She was treated with IV ciprofloxacin and was ultimately discharged on 10/05/2019 to a skilled nursing facility.  On ASA, Plavix for CVA   She was previously seen in cardiology outpatient consultation 08/24/2018 regarding hypertension and CHF referred by her PCP. Per chart review, she had chronic atypical chest pain with dyspnea on exertion with a history of tobacco use and COPD. An echocardiogram was performed 01/09/2017 which showed an EF of 65 to 70% with G1 DD and no valvular disease. No medication changes were made however a repeat echocardiogram was ordered and performed which again was with G1 DD, similar to prior echocardiogram.      1. Abnormal echocardiogram with evidence of HOCM:  -Patient was last seen by cardiology service at which time an echocardiogram was performed 09/07/2018 with normal EF and no valvular disease.  -She has multiple recent hospitalizations and ED visits complicated by recent CVA and altered mental status. Repeat echocardiogram 09/03/2019 with EF at 65 to 70% with hyperdynamic function and severely increased LVH with asymmetric basal and septal hypertrophy measuring 18 mm and basal septum with no resting LVOT obstruction. Recommendations were to follow-up with cardiac MRI to evaluate for hypertrophic cardiomyopathy versus amyloidosis if clinically indicated.  -Follow-up echocardiogram 15 days later on 08/2018 with similar findings  -Given significant change in LV function from 08/2018 to 08/2019, will proceed with cardiac MRI for further investigation  -In the meantime we will  continue beta-blocker therapy and limit diuresis.  -Needs to increase oral intake to prevent hypotension  Metoprolol?  -Plan for cMRI for definitive  workup -Also needs to have close family members including offspring and siblings screened with echo for [facility] -May need referral to EP for ICD  Dihydropyridine therapy verapamil versus diltiazem. Avoid volume depletion. Want to avoid hypotension, lightheadedness and syncope do not use vasodilators including amlodipine, nitroglycerin, ACEI or ARB's. No diuretics. Can use with caution in patients without LVOT obstruction.   2. Recent CVA  -Patient suffered a stroke, left posterior frontal lobe on 09/16/2019  -Follow-up with neurology as an outpatient  -Continue Plavix and aspirin   3. Acute metabolic encephalopathy  -possibly secondary to UTI vs failure to thrive and poor oral intake  -unknown baseline- have tried to reach out husband  -MRI brain: No evidence of acute intercranial normality. Expected evolution of known small subacute infarct within the posterior left frontal lobe.  -upon review of chart, patient has had confusion since previous admission  -Appears to be calm this morning and AAOx2   4. Acute Enterobacter UTI  -Urine culture from 10/17/2019 showed Enterobacter aerogenes  -Urine culture from 09/22/2019 showed Klebsiella originates, resistance to cephalosporins  -PCP had tried to start patient on ciprofloxacin however patient's family was unable to obtain medication  -Currently on IV ciprofloxacin  -Urine culture shows multidrug resistant organism, however sensitive to ciprofloxacin  -Patient with suprapubic pain, obtained x-ray which showed no acute abdominal abnormality. Possible pelvic floor laxity.  -Patient continues to complain of pelvic/groin pain  -Obtain CT abdomen pelvis which showed normal appearance of bilateral kidneys and renal collecting system. No urinary obstruction or pyelonephritis.    5. Essential hypertension and tachycardia  -BP stable  -Continue metoprolol   6. Acute mild hyponatremia  -sodium 130  -repeat in one week   7. Hypokalemia  -resolved  with replacement   8. Hypomagnesemia  -resolved with replacement  -repeat BMP in one week   9. Failure to thrive/underweight  -BMI of 17.37  -At home, patient reported not to be eating or drinking much  -Suspect symptoms to be secondary to UTI  -Continue supplements  -Continue on IVF due to poor oral intake  -Have added low-dose Remeron   10. Normocytic anemia  -hemoglobin stable   11. Diabetes mellitus, type II  -hemoglobin A1c 6.2 on 09/17/2019  -Not on any diabetic medications at home, likely diet controlled   12. Hyperlipidemia  -Recent lipid panel from 09/17/2019 showed total cholesterol 106, HDL 34, LDL 34, triglycerides 190  -Continue statin   13.Elevated AST  -Acute on chronic  -AST elevated to 42 (was higher in Jan 2021)   14. Deconditioning  -PT recommended home health  -Recommended home health, if patient's family unable to provide 24/7 assist may need SNF. 3 and 1 bedside commode.  Anxiety  -Started patient on low-dose Ativan which seems to have helped        Past Medical History:  Diagnosis Date  . Allergy   . Anemia    "when I was young"  . Anginal pain ([place])   . Anxiety   . Arthritis    "all over my body"   . Chest pain 04/07/2014  . Chronic diastolic heart failure, NYHA class 1 ([place]) 01/22/2015  . Chronic lower back pain   . Chronic steroid use 12/11/2012  . COPD (chronic obstructive pulmonary disease) ([place]) 01/09/2017  . 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 Elevated  lactic acid level 01/22/2015  . Essential hypertension 06/15/2018  . Frequent UTI    "recently" (06/14/2015)  . Gastric mass   . GERD (gastroesophageal reflux disease)   . Headache(784.0)    "weekly; wake up w/them " (110/19/2018)  . Heart murmur   . High cholesterol   . History of blood transfusion ~ 1954   "related to my periods"  . Migraine    "once/month maybe" (05/16/2017)  . Multiple falls 06/14/2015  . Noncompliance 12/11/2012  . OSA  (obstructive sleep apnea)    "I think I'm using a BiPAP" (06/14/2015)  . Sepsis ([place]) 01/09/2017  . Syncope 08/27/2019  . Syncope and collapse 12/11/2012  . Tachycardia with heart rate 100-120 beats per minute 04/08/2014  . Type II diabetes mellitus ([place])   . Uncontrolled type 2 diabetes mellitus with complication [facility])     Past Surgical History:  Procedure Laterality Date  . APPENDECTOMY  1970   Archie Endo 12/12/2010  . BACK SURGERY    . BIOPSY  05/25/2019   Procedure: BIOPSY;  Surgeon: Rush Landmark Telford Nab., MD;  Location: [place];  Service: Gastroenterology;;  . [place]   Archie Endo 12/12/2010  . ESOPHAGOGASTRODUODENOSCOPY N/A 05/25/2019   Procedure: ESOPHAGOGASTRODUODENOSCOPY (EGD);  Surgeon: Irving Copas., MD;  Location: [place] [facility];  Service: Gastroenterology;  Laterality: N/A;  . EUS N/A 10/16/2017   Procedure: UPPER ENDOSCOPIC ULTRASOUND (EUS) RADIAL;  Surgeon: Milus Banister, MD;  Location: [facility];  Service: Endoscopy;  Laterality: N/A;  . FRACTURE SURGERY    . LAPAROSCOPIC CHOLECYSTECTOMY  1990's  . LUMBAR MICRODISCECTOMY Left 09/2006   L5-S1/notes 12/12/2010  . MULTIPLE EXTRACTIONS WITH ALVEOLOPLASTY Bilateral 06/13/2017   Procedure: MULTIPLE EXTRACTION WITH ALVEOLOPLASTY;  Surgeon: Diona Browner, DDS;  Location: [facility];  Service: Oral Surgery;  Laterality: Bilateral;  . MULTIPLE TOOTH EXTRACTIONS    . ORIF SHOULDER FRACTURE Left ~ 2009   "put cement down in it"   . TONSILLECTOMY  1950's?  . TOTAL ABDOMINAL HYSTERECTOMY  1979   Archie Endo 12/12/2010     Current Outpatient Medications  Medication Sig Dispense Refill  . albuterol (ACCUNEB) 0.63 MG/3ML nebulizer solution Take 1 ampule by nebulization every 6 (six) hours as needed for wheezing.     Marland Kitchen aspirin EC 81 MG tablet Take 1 tablet (81 mg total) by mouth every Monday, Wednesday, and Friday.    Marland Kitchen atorvastatin (LIPITOR) 20 MG tablet Take 1 tablet (20 mg total) by mouth daily. Take 1  Tablet by mouth daily. (Patient taking differently: Take 20 mg by mouth daily. ) 30 tablet 0  . clopidogrel (PLAVIX) 75 MG tablet Take 1 tablet (75 mg total) by mouth daily. 30 tablet 0  . dicyclomine (BENTYL) 10 MG capsule Take 1 capsule (10 mg total) by mouth every 8 (eight) hours as needed for spasms. 60 capsule 3  . docusate sodium (COLACE) 100 MG capsule Take 1 capsule (100 mg total) by mouth 2 (two) times daily. 10 capsule 0  . feeding supplement, ENSURE ENLIVE, (ENSURE ENLIVE) LIQD Take 237 mLs by mouth 3 (three) times daily between meals. 237 mL 12  . fluticasone (VERAMYST) 27.5 MCG/SPRAY nasal spray Place 2 sprays into the nose daily.    . Fluticasone-Salmeterol (ADVAIR) 500-50 MCG/DOSE AEPB Inhale 1 puff into the lungs 2 (two) times daily.    . folic acid (FOLVITE) 1 MG tablet Take 1 tablet (1 mg total) by mouth daily. 100 tablet 3  . glucose blood (ONETOUCH VERIO) test strip  Use to check blood sugar 3 to 4 times a day before meals 100 each 12  . hydrOXYzine (ATARAX/VISTARIL) 10 MG tablet 1 q 8h prn itching (Patient taking differently: Take 10 mg by mouth every 8 (eight) hours as needed for itching. ) 30 tablet 0  . ibuprofen (ADVIL) 400 MG tablet Take 400 mg by mouth every 8 (eight) hours as needed for mild pain.    Marland Kitchen ipratropium-albuterol (DUONEB) 0.5-2.5 (3) MG/3ML SOLN Take 3 mLs by nebulization every 6 (six) hours as needed. 360 mL 0  . LORazepam (ATIVAN) 0.5 MG tablet Take 1 tablet (0.5 mg total) by mouth every 6 (six) hours as needed for anxiety. 20 tablet 0  . metoCLOPramide (REGLAN) 5 MG tablet Take 1 tablet (5 mg total) by mouth 3 (three) times daily before meals. 90 tablet 3  . metoprolol tartrate (LOPRESSOR) 50 MG tablet Take 1 tablet (50 mg total) by mouth 2 (two) times daily.    . mirtazapine (REMERON SOL-TAB) 15 MG disintegrating tablet Take 0.5 tablets (7.5 mg total) by mouth at bedtime.    . mometasone-formoterol (DULERA) 100-5 MCG/ACT AERO Inhale 2 puffs into the lungs 2  (two) times daily.    . Multiple Vitamin (MULTIVITAMIN WITH MINERALS) TABS tablet Take 1 tablet by mouth daily. 30 tablet 0  . ondansetron (ZOFRAN ODT) 4 MG disintegrating tablet Take 1 tablet (4 mg total) by mouth every 8 (eight) hours as needed for up to 30 doses for nausea or vomiting. 30 tablet 0  . simethicone (GAS-X EXTRA STRENGTH) 125 MG chewable tablet Chew 125 mg by mouth every 6 (six) hours as needed for flatulence.    . tamsulosin (FLOMAX) 0.4 MG CAPS capsule Take 1 capsule (0.4 mg total) by mouth daily.    . traMADol (ULTRAM) 50 MG tablet Take 50 mg by mouth every 6 (six) hours as needed for moderate pain.    . vitamin B-12 1000 MCG tablet Take 1 tablet (1,000 mcg total) by mouth daily. 30 tablet 0   No current facility-administered medications for this visit.    Allergies:   Lactose intolerance (gi)    Social History:  The patient  reports that she has quit smoking. Her smoking use included cigarettes. She has never used smokeless tobacco. She reports previous alcohol use. She reports that she does not use drugs.   Family History:  The patient's ***family history includes Cancer - Other in her sister; Diabetes Mellitus II in her sister; Heart attack in her father; Hypertension in her mother and sister; Migraines in her niece.    ROS:  Please see the history of present illness.   Otherwise, review of systems are positive for {NONE DEFAULTED:18576::"none"}.   All other systems are reviewed and negative.    PHYSICAL EXAM: VS:  There were no vitals taken for this visit. , BMI There is no height or weight on file to calculate BMI. GEN: Well nourished, well developed, in no acute distress HEENT: normal Neck: no JVD, carotid bruits, or masses Cardiac: ***RRR; no murmurs, rubs, or gallops,no edema  Respiratory:  clear to auscultation bilaterally, normal work of breathing GI: soft, nontender, nondistended, + BS MS: no deformity or atrophy Skin: warm and dry, no rash Neuro:   Strength and sensation are intact Psych: euthymic mood, full affect   EKG:  EKG {ACTION; IS/IS [id number] ordered today. The ekg ordered today demonstrates ***   Recent Labs: 09/06/2019: B Natriuretic Peptide 45.5 09/18/2019: TSH 1.829 10/01/2019: ALT 22 10/02/2019: Hemoglobin 10.7;  Platelets 231 10/05/2019: BUN <5; Creatinine, Ser 0.83; Magnesium 1.7; Potassium 3.5; Sodium 130    Lipid Panel    Component Value Date/Time   CHOL 106 09/17/2019 0445   CHOL 368 (H) 08/13/2018 1606   TRIG 190 (H) 09/17/2019 0445   HDL 34 (L) 09/17/2019 0445   HDL 70 08/13/2018 1606   CHOLHDL 3.1 09/17/2019 0445   VLDL 38 09/17/2019 0445   LDLCALC 34 09/17/2019 0445   LDLCALC 243 (H) 08/13/2018 1606      Wt Readings from Last 3 Encounters:  09/28/19 114 lb 6.7 oz (51.9 kg)  09/22/19 104 lb 6.4 oz (47.4 kg)  09/17/19 106 lb 0.7 oz (48.1 kg)      Other studies Reviewed: Additional studies/ records that were reviewed today include: ***. Review of the above records demonstrates: ***  Echocardiogram 09/03/2019:   1. Left ventricular ejection fraction, by visual estimation, is 65 to  70%. The left ventricle has hyperdynamic function. There is severely  increased left ventricular hypertrophy.  2. Asymmetric basal septal hypertrophy measuring 1mm in basal septum  (60mm in posterior wall). No resting LVOT obstruction. Consider cardiac  MRI to evaluate for hypertrophic cardiomyopathy versus amyloidosis if  clinically indicated  3. Left ventricular diastolic parameters are indeterminate.  4. Global right ventricle has normal systolic function.The right  ventricular size is normal.  5. Small pericardial effusion.  6. Presence of pericardial fat pad.  7. The mitral valve is normal in structure. Trivial mitral valve  regurgitation.  8. The tricuspid valve is normal in structure. Tricuspid valve  regurgitation is not demonstrated.  9. The pulmonic valve was not well visualized. Pulmonic valve    regurgitation is not visualized.  10. The aortic valve was not well visualized. Aortic valve regurgitation  is not visualized. No evidence of aortic valve stenosis.  11. Left atrial size was normal.  12. Right atrial size was normal.  13. The inferior vena cava is normal in size with <50% respiratory  variability, suggesting right atrial pressure of 8 mmHg.  14. TR signal is inadequate for assessing pulmonary artery systolic  pressure.  Echocardiogram 09/18/2019:    1. Left ventricular ejection fraction, by estimation, is >75%. Left  ventricular ejection fraction by PLAX is 86 %. The left ventricle has  hyperdynamic function. The left ventricle has no regional wall motion  abnormalities. There is severe asymmetric  left ventricular hypertrophy of the basal-septal segment.  2. Right ventricular systolic function is normal. The right ventricular  size is normal. There is normal pulmonary artery systolic pressure.  3. The mitral valve is normal in structure and function. No evidence of  mitral valve regurgitation. No evidence of mitral stenosis.  4. The aortic valve is tricuspid. Aortic valve regurgitation is not  visualized. No aortic stenosis is present.  5. The inferior vena cava is normal in size with greater than 50%  respiratory variability, suggesting right atrial pressure of 3 mmHg.   ASSESSMENT AND PLAN:  1.  ***   Current medicines are reviewed at length with the patient today.  The patient {ACTIONS; HAS/DOES NOT HAVE:19233} concerns regarding medicines.  The following changes have been made:  {PLAN; NO CHANGE:13088:s}  Labs/ tests ordered today include: *** No orders of the defined types were placed in this encounter.    Disposition:   FU with *** in {gen number [id number] {Days to years:10300}  Signed, Kathyrn Drown, NP  10/16/2019 4:52 PM    [facility] [id number]  [address] [place], [place]  [address] Phone: [phone]; Fax: [phone]

## 2019-10-22 ENCOUNTER — Ambulatory Visit: Payer: Medicare Other | Admitting: Cardiology

## 2019-10-28 DEATH — deceased

## 2019-11-01 ENCOUNTER — Inpatient Hospital Stay: Payer: Medicare Other | Admitting: Diagnostic Neuroimaging

## 2019-11-03 ENCOUNTER — Telehealth: Payer: Self-pay | Admitting: Neurology

## 2019-11-03 ENCOUNTER — Inpatient Hospital Stay: Payer: Medicare Other | Admitting: Neurology

## 2019-11-03 NOTE — Telephone Encounter (Signed)
Pt niece judy called to cancel pt apt states pt passed away October 25, 2019

## 2019-11-04 ENCOUNTER — Encounter: Payer: Self-pay | Admitting: Nurse Practitioner

## 2019-11-04 NOTE — Progress Notes (Signed)
Not seen

## 2019-12-22 ENCOUNTER — Telehealth: Payer: Self-pay

## 2020-09-27 ENCOUNTER — Ambulatory Visit: Payer: Medicare Other
